# Patient Record
Sex: Female | Born: 1940 | Race: Black or African American | Hispanic: No | Marital: Married | Smoking: Never smoker
Health system: Southern US, Community
[De-identification: ages and names within clinical notes are randomized; demographics above are authoritative.]

## PROBLEM LIST (undated history)

## (undated) DIAGNOSIS — Z8719 Personal history of other diseases of the digestive system: Secondary | ICD-10-CM

## (undated) DIAGNOSIS — Z9989 Dependence on other enabling machines and devices: Secondary | ICD-10-CM

## (undated) DIAGNOSIS — Z531 Procedure and treatment not carried out because of patient's decision for reasons of belief and group pressure: Secondary | ICD-10-CM

## (undated) DIAGNOSIS — R202 Paresthesia of skin: Secondary | ICD-10-CM

## (undated) DIAGNOSIS — N289 Disorder of kidney and ureter, unspecified: Secondary | ICD-10-CM

## (undated) DIAGNOSIS — M329 Systemic lupus erythematosus, unspecified: Secondary | ICD-10-CM

## (undated) DIAGNOSIS — I82409 Acute embolism and thrombosis of unspecified deep veins of unspecified lower extremity: Secondary | ICD-10-CM

## (undated) DIAGNOSIS — K5090 Crohn's disease, unspecified, without complications: Secondary | ICD-10-CM

## (undated) DIAGNOSIS — Z87442 Personal history of urinary calculi: Secondary | ICD-10-CM

## (undated) DIAGNOSIS — E039 Hypothyroidism, unspecified: Secondary | ICD-10-CM

## (undated) DIAGNOSIS — I4891 Unspecified atrial fibrillation: Secondary | ICD-10-CM

## (undated) DIAGNOSIS — D649 Anemia, unspecified: Secondary | ICD-10-CM

## (undated) DIAGNOSIS — K56609 Unspecified intestinal obstruction, unspecified as to partial versus complete obstruction: Secondary | ICD-10-CM

## (undated) DIAGNOSIS — G4733 Obstructive sleep apnea (adult) (pediatric): Secondary | ICD-10-CM

## (undated) DIAGNOSIS — R200 Anesthesia of skin: Secondary | ICD-10-CM

## (undated) DIAGNOSIS — J189 Pneumonia, unspecified organism: Secondary | ICD-10-CM

## (undated) DIAGNOSIS — K219 Gastro-esophageal reflux disease without esophagitis: Secondary | ICD-10-CM

## (undated) DIAGNOSIS — I10 Essential (primary) hypertension: Secondary | ICD-10-CM

## (undated) DIAGNOSIS — I82403 Acute embolism and thrombosis of unspecified deep veins of lower extremity, bilateral: Secondary | ICD-10-CM

## (undated) DIAGNOSIS — K922 Gastrointestinal hemorrhage, unspecified: Secondary | ICD-10-CM

## (undated) DIAGNOSIS — M1990 Unspecified osteoarthritis, unspecified site: Secondary | ICD-10-CM

## (undated) DIAGNOSIS — M109 Gout, unspecified: Secondary | ICD-10-CM

## (undated) DIAGNOSIS — F419 Anxiety disorder, unspecified: Secondary | ICD-10-CM

## (undated) DIAGNOSIS — K439 Ventral hernia without obstruction or gangrene: Secondary | ICD-10-CM

## (undated) HISTORY — DX: Gout, unspecified: M10.9

## (undated) HISTORY — PX: BREAST EXCISIONAL BIOPSY: SUR124

## (undated) HISTORY — PX: INSERTION OF DIALYSIS CATHETER: SHX1324

## (undated) HISTORY — PX: EXCISIONAL HEMORRHOIDECTOMY: SHX1541

## (undated) HISTORY — PX: PARATHYROID IMPLANT REMOVAL: SHX5276

## (undated) HISTORY — PX: BONE MARROW BIOPSY: SHX199

## (undated) HISTORY — PX: CATARACT EXTRACTION W/ INTRAOCULAR LENS  IMPLANT, BILATERAL: SHX1307

---

## 1993-05-09 HISTORY — PX: ABDOMINAL EXPLORATION SURGERY: SHX538

## 1993-05-09 HISTORY — PX: CHOLECYSTECTOMY: SHX55

## 1996-05-09 HISTORY — PX: COLOSTOMY: SHX63

## 1996-05-09 HISTORY — PX: COLECTOMY: SHX59

## 1997-05-09 HISTORY — PX: COLOSTOMY REVERSAL: SHX5782

## 1997-08-17 ENCOUNTER — Emergency Department (HOSPITAL_COMMUNITY): Admission: EM | Admit: 1997-08-17 | Discharge: 1997-08-17 | Payer: Self-pay | Admitting: Emergency Medicine

## 1997-08-26 ENCOUNTER — Other Ambulatory Visit: Admission: RE | Admit: 1997-08-26 | Discharge: 1997-08-26 | Payer: Self-pay | Admitting: *Deleted

## 1997-09-30 ENCOUNTER — Other Ambulatory Visit: Admission: RE | Admit: 1997-09-30 | Discharge: 1997-09-30 | Payer: Self-pay | Admitting: Gynecology

## 1998-01-17 ENCOUNTER — Emergency Department (HOSPITAL_COMMUNITY): Admission: EM | Admit: 1998-01-17 | Discharge: 1998-01-17 | Payer: Self-pay | Admitting: Emergency Medicine

## 1998-06-07 ENCOUNTER — Emergency Department (HOSPITAL_COMMUNITY): Admission: EM | Admit: 1998-06-07 | Discharge: 1998-06-07 | Payer: Self-pay | Admitting: Emergency Medicine

## 1998-06-16 ENCOUNTER — Ambulatory Visit (HOSPITAL_COMMUNITY): Admission: RE | Admit: 1998-06-16 | Discharge: 1998-06-16 | Payer: Self-pay | Admitting: Nephrology

## 1998-06-24 ENCOUNTER — Ambulatory Visit (HOSPITAL_COMMUNITY): Admission: RE | Admit: 1998-06-24 | Discharge: 1998-06-24 | Payer: Self-pay | Admitting: General Surgery

## 1998-07-09 ENCOUNTER — Observation Stay (HOSPITAL_COMMUNITY): Admission: AD | Admit: 1998-07-09 | Discharge: 1998-07-10 | Payer: Self-pay | Admitting: Gastroenterology

## 1998-08-18 ENCOUNTER — Ambulatory Visit (HOSPITAL_COMMUNITY): Admission: RE | Admit: 1998-08-18 | Discharge: 1998-08-18 | Payer: Self-pay | Admitting: Psychiatry

## 1998-11-19 ENCOUNTER — Other Ambulatory Visit: Admission: RE | Admit: 1998-11-19 | Discharge: 1998-11-19 | Payer: Self-pay | Admitting: Gynecology

## 1998-12-30 ENCOUNTER — Ambulatory Visit (HOSPITAL_COMMUNITY): Admission: RE | Admit: 1998-12-30 | Discharge: 1998-12-30 | Payer: Self-pay | Admitting: *Deleted

## 1999-01-04 ENCOUNTER — Inpatient Hospital Stay (HOSPITAL_COMMUNITY): Admission: EM | Admit: 1999-01-04 | Discharge: 1999-01-08 | Payer: Self-pay | Admitting: Emergency Medicine

## 1999-01-12 ENCOUNTER — Ambulatory Visit (HOSPITAL_COMMUNITY): Admission: RE | Admit: 1999-01-12 | Discharge: 1999-01-12 | Payer: Self-pay | Admitting: Nephrology

## 1999-01-16 ENCOUNTER — Inpatient Hospital Stay (HOSPITAL_COMMUNITY): Admission: EM | Admit: 1999-01-16 | Discharge: 1999-01-17 | Payer: Self-pay | Admitting: Emergency Medicine

## 1999-01-16 ENCOUNTER — Encounter: Payer: Self-pay | Admitting: Nephrology

## 1999-01-19 ENCOUNTER — Ambulatory Visit (HOSPITAL_COMMUNITY): Admission: RE | Admit: 1999-01-19 | Discharge: 1999-01-19 | Payer: Self-pay | Admitting: Nephrology

## 1999-02-02 ENCOUNTER — Ambulatory Visit (HOSPITAL_COMMUNITY): Admission: RE | Admit: 1999-02-02 | Discharge: 1999-02-03 | Payer: Self-pay | Admitting: *Deleted

## 1999-02-03 ENCOUNTER — Encounter: Payer: Self-pay | Admitting: *Deleted

## 1999-03-04 ENCOUNTER — Encounter: Payer: Self-pay | Admitting: *Deleted

## 1999-03-04 ENCOUNTER — Ambulatory Visit (HOSPITAL_COMMUNITY): Admission: RE | Admit: 1999-03-04 | Discharge: 1999-03-04 | Payer: Self-pay | Admitting: *Deleted

## 1999-03-07 ENCOUNTER — Emergency Department (HOSPITAL_COMMUNITY): Admission: EM | Admit: 1999-03-07 | Discharge: 1999-03-07 | Payer: Self-pay | Admitting: Emergency Medicine

## 1999-03-10 ENCOUNTER — Ambulatory Visit (HOSPITAL_COMMUNITY): Admission: RE | Admit: 1999-03-10 | Discharge: 1999-03-10 | Payer: Self-pay | Admitting: *Deleted

## 1999-04-07 ENCOUNTER — Encounter: Admission: RE | Admit: 1999-04-07 | Discharge: 1999-04-07 | Payer: Self-pay | Admitting: Infectious Diseases

## 1999-06-10 ENCOUNTER — Ambulatory Visit (HOSPITAL_COMMUNITY): Admission: RE | Admit: 1999-06-10 | Discharge: 1999-06-10 | Payer: Self-pay | Admitting: Nephrology

## 1999-06-29 ENCOUNTER — Encounter (INDEPENDENT_AMBULATORY_CARE_PROVIDER_SITE_OTHER): Payer: Self-pay

## 1999-06-29 ENCOUNTER — Inpatient Hospital Stay (HOSPITAL_COMMUNITY): Admission: AD | Admit: 1999-06-29 | Discharge: 1999-07-05 | Payer: Self-pay | Admitting: Gastroenterology

## 1999-07-02 ENCOUNTER — Encounter: Payer: Self-pay | Admitting: Gastroenterology

## 1999-07-15 ENCOUNTER — Ambulatory Visit (HOSPITAL_COMMUNITY): Admission: RE | Admit: 1999-07-15 | Discharge: 1999-07-15 | Payer: Self-pay | Admitting: Gastroenterology

## 1999-07-15 ENCOUNTER — Encounter: Payer: Self-pay | Admitting: Gastroenterology

## 1999-09-24 ENCOUNTER — Inpatient Hospital Stay (HOSPITAL_COMMUNITY): Admission: EM | Admit: 1999-09-24 | Discharge: 1999-09-30 | Payer: Self-pay | Admitting: Emergency Medicine

## 1999-09-24 ENCOUNTER — Encounter: Payer: Self-pay | Admitting: Emergency Medicine

## 1999-09-25 ENCOUNTER — Encounter: Payer: Self-pay | Admitting: Nephrology

## 1999-09-27 ENCOUNTER — Encounter: Payer: Self-pay | Admitting: Gastroenterology

## 1999-09-28 ENCOUNTER — Encounter: Payer: Self-pay | Admitting: Gastroenterology

## 1999-10-07 ENCOUNTER — Encounter (INDEPENDENT_AMBULATORY_CARE_PROVIDER_SITE_OTHER): Payer: Self-pay | Admitting: *Deleted

## 1999-10-07 ENCOUNTER — Encounter: Payer: Self-pay | Admitting: Urology

## 1999-10-07 ENCOUNTER — Ambulatory Visit (HOSPITAL_COMMUNITY): Admission: RE | Admit: 1999-10-07 | Discharge: 1999-10-08 | Payer: Self-pay | Admitting: Urology

## 1999-11-25 ENCOUNTER — Ambulatory Visit (HOSPITAL_COMMUNITY): Admission: RE | Admit: 1999-11-25 | Discharge: 1999-11-25 | Payer: Self-pay | Admitting: Urology

## 1999-11-25 ENCOUNTER — Encounter: Payer: Self-pay | Admitting: Urology

## 1999-12-08 ENCOUNTER — Encounter: Payer: Self-pay | Admitting: Urology

## 1999-12-08 ENCOUNTER — Encounter: Admission: RE | Admit: 1999-12-08 | Discharge: 1999-12-08 | Payer: Self-pay | Admitting: Urology

## 1999-12-19 ENCOUNTER — Inpatient Hospital Stay (HOSPITAL_COMMUNITY): Admission: EM | Admit: 1999-12-19 | Discharge: 1999-12-24 | Payer: Self-pay | Admitting: Emergency Medicine

## 1999-12-21 ENCOUNTER — Encounter: Payer: Self-pay | Admitting: General Surgery

## 1999-12-21 ENCOUNTER — Encounter (INDEPENDENT_AMBULATORY_CARE_PROVIDER_SITE_OTHER): Payer: Self-pay | Admitting: Specialist

## 2000-02-10 ENCOUNTER — Encounter: Admission: RE | Admit: 2000-02-10 | Discharge: 2000-02-10 | Payer: Self-pay | Admitting: Urology

## 2000-02-10 ENCOUNTER — Encounter: Payer: Self-pay | Admitting: Urology

## 2000-05-29 ENCOUNTER — Encounter: Admission: RE | Admit: 2000-05-29 | Discharge: 2000-05-29 | Payer: Self-pay | Admitting: Nephrology

## 2000-05-29 ENCOUNTER — Encounter: Payer: Self-pay | Admitting: Nephrology

## 2000-06-01 ENCOUNTER — Inpatient Hospital Stay (HOSPITAL_COMMUNITY): Admission: RE | Admit: 2000-06-01 | Discharge: 2000-06-04 | Payer: Self-pay

## 2000-06-01 ENCOUNTER — Encounter (INDEPENDENT_AMBULATORY_CARE_PROVIDER_SITE_OTHER): Payer: Self-pay | Admitting: *Deleted

## 2000-06-20 ENCOUNTER — Other Ambulatory Visit: Admission: RE | Admit: 2000-06-20 | Discharge: 2000-06-20 | Payer: Self-pay | Admitting: Gynecology

## 2000-08-01 ENCOUNTER — Ambulatory Visit (HOSPITAL_COMMUNITY): Admission: RE | Admit: 2000-08-01 | Discharge: 2000-08-01 | Payer: Self-pay | Admitting: Nephrology

## 2000-08-08 ENCOUNTER — Ambulatory Visit (HOSPITAL_COMMUNITY): Admission: RE | Admit: 2000-08-08 | Discharge: 2000-08-08 | Payer: Self-pay | Admitting: Nephrology

## 2000-08-08 ENCOUNTER — Encounter: Payer: Self-pay | Admitting: Nephrology

## 2000-09-11 ENCOUNTER — Encounter (INDEPENDENT_AMBULATORY_CARE_PROVIDER_SITE_OTHER): Payer: Self-pay | Admitting: Specialist

## 2000-09-11 ENCOUNTER — Encounter: Payer: Self-pay | Admitting: Nephrology

## 2000-09-11 ENCOUNTER — Inpatient Hospital Stay (HOSPITAL_COMMUNITY): Admission: EM | Admit: 2000-09-11 | Discharge: 2000-09-19 | Payer: Self-pay | Admitting: Emergency Medicine

## 2000-09-14 ENCOUNTER — Encounter: Payer: Self-pay | Admitting: Nephrology

## 2000-09-18 ENCOUNTER — Encounter: Payer: Self-pay | Admitting: Nephrology

## 2000-09-25 ENCOUNTER — Emergency Department (HOSPITAL_COMMUNITY): Admission: EM | Admit: 2000-09-25 | Discharge: 2000-09-25 | Payer: Self-pay | Admitting: Emergency Medicine

## 2000-09-26 ENCOUNTER — Encounter: Payer: Self-pay | Admitting: Nephrology

## 2000-09-26 ENCOUNTER — Inpatient Hospital Stay (HOSPITAL_COMMUNITY): Admission: AD | Admit: 2000-09-26 | Discharge: 2000-09-27 | Payer: Self-pay | Admitting: Nephrology

## 2000-10-04 ENCOUNTER — Inpatient Hospital Stay (HOSPITAL_COMMUNITY): Admission: RE | Admit: 2000-10-04 | Discharge: 2000-10-04 | Payer: Self-pay | Admitting: Vascular Surgery

## 2000-11-01 ENCOUNTER — Emergency Department (HOSPITAL_COMMUNITY): Admission: EM | Admit: 2000-11-01 | Discharge: 2000-11-01 | Payer: Self-pay | Admitting: Emergency Medicine

## 2000-11-07 ENCOUNTER — Ambulatory Visit (HOSPITAL_COMMUNITY): Admission: RE | Admit: 2000-11-07 | Discharge: 2000-11-07 | Payer: Self-pay | Admitting: Nephrology

## 2000-11-07 ENCOUNTER — Encounter: Payer: Self-pay | Admitting: Nephrology

## 2000-11-10 ENCOUNTER — Encounter: Admission: RE | Admit: 2000-11-10 | Discharge: 2000-11-10 | Payer: Self-pay | Admitting: Nephrology

## 2000-11-10 ENCOUNTER — Encounter: Payer: Self-pay | Admitting: Nephrology

## 2000-11-14 ENCOUNTER — Ambulatory Visit (HOSPITAL_COMMUNITY): Admission: RE | Admit: 2000-11-14 | Discharge: 2000-11-14 | Payer: Self-pay | Admitting: Nephrology

## 2000-12-01 ENCOUNTER — Encounter: Admission: RE | Admit: 2000-12-01 | Discharge: 2000-12-01 | Payer: Self-pay | Admitting: Nephrology

## 2000-12-01 ENCOUNTER — Encounter: Payer: Self-pay | Admitting: Nephrology

## 2001-01-18 ENCOUNTER — Encounter: Payer: Self-pay | Admitting: Nephrology

## 2001-01-18 ENCOUNTER — Ambulatory Visit (HOSPITAL_COMMUNITY): Admission: RE | Admit: 2001-01-18 | Discharge: 2001-01-18 | Payer: Self-pay | Admitting: Nephrology

## 2001-03-10 ENCOUNTER — Ambulatory Visit (HOSPITAL_COMMUNITY): Admission: RE | Admit: 2001-03-10 | Discharge: 2001-03-11 | Payer: Self-pay | Admitting: General Surgery

## 2001-03-10 ENCOUNTER — Encounter (INDEPENDENT_AMBULATORY_CARE_PROVIDER_SITE_OTHER): Payer: Self-pay | Admitting: Specialist

## 2001-03-15 ENCOUNTER — Emergency Department (HOSPITAL_COMMUNITY): Admission: EM | Admit: 2001-03-15 | Discharge: 2001-03-15 | Payer: Self-pay | Admitting: Emergency Medicine

## 2001-07-26 ENCOUNTER — Other Ambulatory Visit: Admission: RE | Admit: 2001-07-26 | Discharge: 2001-07-26 | Payer: Self-pay | Admitting: Gynecology

## 2001-11-20 ENCOUNTER — Encounter: Payer: Self-pay | Admitting: Nephrology

## 2001-11-20 ENCOUNTER — Encounter: Admission: RE | Admit: 2001-11-20 | Discharge: 2001-11-20 | Payer: Self-pay | Admitting: Nephrology

## 2001-12-06 ENCOUNTER — Encounter: Payer: Self-pay | Admitting: Nephrology

## 2001-12-06 ENCOUNTER — Encounter: Admission: RE | Admit: 2001-12-06 | Discharge: 2001-12-06 | Payer: Self-pay | Admitting: Nephrology

## 2002-02-14 ENCOUNTER — Ambulatory Visit (HOSPITAL_COMMUNITY): Admission: RE | Admit: 2002-02-14 | Discharge: 2002-02-14 | Payer: Self-pay | Admitting: Gastroenterology

## 2002-02-25 ENCOUNTER — Encounter: Admission: RE | Admit: 2002-02-25 | Discharge: 2002-02-25 | Payer: Self-pay | Admitting: Nephrology

## 2002-02-25 ENCOUNTER — Encounter: Payer: Self-pay | Admitting: Nephrology

## 2002-04-19 ENCOUNTER — Encounter: Payer: Self-pay | Admitting: Nephrology

## 2002-04-19 ENCOUNTER — Encounter: Admission: RE | Admit: 2002-04-19 | Discharge: 2002-04-19 | Payer: Self-pay | Admitting: Nephrology

## 2002-08-01 ENCOUNTER — Inpatient Hospital Stay (HOSPITAL_COMMUNITY): Admission: AD | Admit: 2002-08-01 | Discharge: 2002-08-15 | Payer: Self-pay | Admitting: Emergency Medicine

## 2002-08-01 ENCOUNTER — Encounter: Payer: Self-pay | Admitting: Nephrology

## 2002-08-02 ENCOUNTER — Encounter: Payer: Self-pay | Admitting: Nephrology

## 2002-08-04 ENCOUNTER — Encounter: Payer: Self-pay | Admitting: Nephrology

## 2002-08-07 ENCOUNTER — Encounter: Payer: Self-pay | Admitting: Nephrology

## 2002-08-09 ENCOUNTER — Encounter (INDEPENDENT_AMBULATORY_CARE_PROVIDER_SITE_OTHER): Payer: Self-pay | Admitting: Specialist

## 2002-08-12 ENCOUNTER — Encounter: Payer: Self-pay | Admitting: Nephrology

## 2002-08-13 ENCOUNTER — Encounter: Payer: Self-pay | Admitting: Nephrology

## 2002-10-03 ENCOUNTER — Encounter: Payer: Self-pay | Admitting: Nephrology

## 2002-10-03 ENCOUNTER — Encounter: Admission: RE | Admit: 2002-10-03 | Discharge: 2002-10-03 | Payer: Self-pay | Admitting: Nephrology

## 2002-10-22 ENCOUNTER — Inpatient Hospital Stay (HOSPITAL_COMMUNITY): Admission: EM | Admit: 2002-10-22 | Discharge: 2002-11-01 | Payer: Self-pay | Admitting: Emergency Medicine

## 2002-10-22 ENCOUNTER — Encounter: Payer: Self-pay | Admitting: Emergency Medicine

## 2002-10-23 ENCOUNTER — Encounter: Payer: Self-pay | Admitting: Nephrology

## 2002-10-26 ENCOUNTER — Encounter: Payer: Self-pay | Admitting: Nephrology

## 2002-11-21 ENCOUNTER — Ambulatory Visit (HOSPITAL_COMMUNITY): Admission: RE | Admit: 2002-11-21 | Discharge: 2002-11-21 | Payer: Self-pay | Admitting: Nephrology

## 2002-11-21 ENCOUNTER — Encounter: Payer: Self-pay | Admitting: Nephrology

## 2002-12-03 ENCOUNTER — Ambulatory Visit (HOSPITAL_COMMUNITY): Admission: RE | Admit: 2002-12-03 | Discharge: 2002-12-03 | Payer: Self-pay | Admitting: Vascular Surgery

## 2002-12-03 ENCOUNTER — Encounter: Payer: Self-pay | Admitting: Vascular Surgery

## 2003-01-21 ENCOUNTER — Encounter: Admission: RE | Admit: 2003-01-21 | Discharge: 2003-01-21 | Payer: Self-pay | Admitting: Nephrology

## 2003-01-21 ENCOUNTER — Encounter: Payer: Self-pay | Admitting: Nephrology

## 2003-01-28 ENCOUNTER — Encounter: Admission: RE | Admit: 2003-01-28 | Discharge: 2003-01-28 | Payer: Self-pay | Admitting: Nephrology

## 2003-01-28 ENCOUNTER — Encounter: Payer: Self-pay | Admitting: Nephrology

## 2003-03-04 ENCOUNTER — Ambulatory Visit (HOSPITAL_COMMUNITY): Admission: RE | Admit: 2003-03-04 | Discharge: 2003-03-04 | Payer: Self-pay | Admitting: Nephrology

## 2003-03-20 ENCOUNTER — Inpatient Hospital Stay (HOSPITAL_COMMUNITY): Admission: RE | Admit: 2003-03-20 | Discharge: 2003-03-23 | Payer: Self-pay | Admitting: Urology

## 2003-03-20 ENCOUNTER — Encounter (INDEPENDENT_AMBULATORY_CARE_PROVIDER_SITE_OTHER): Payer: Self-pay | Admitting: *Deleted

## 2003-05-31 ENCOUNTER — Inpatient Hospital Stay (HOSPITAL_COMMUNITY): Admission: EM | Admit: 2003-05-31 | Discharge: 2003-06-03 | Payer: Self-pay

## 2004-03-18 ENCOUNTER — Ambulatory Visit (HOSPITAL_COMMUNITY): Admission: RE | Admit: 2004-03-18 | Discharge: 2004-03-18 | Payer: Self-pay | Admitting: Nephrology

## 2004-04-27 ENCOUNTER — Other Ambulatory Visit: Admission: RE | Admit: 2004-04-27 | Discharge: 2004-04-27 | Payer: Self-pay | Admitting: Gynecology

## 2004-05-09 HISTORY — PX: NEPHRECTOMY TRANSPLANTED ORGAN: SUR880

## 2004-06-10 ENCOUNTER — Encounter: Admission: RE | Admit: 2004-06-10 | Discharge: 2004-06-10 | Payer: Self-pay | Admitting: Nephrology

## 2005-02-02 ENCOUNTER — Encounter: Admission: RE | Admit: 2005-02-02 | Discharge: 2005-02-02 | Payer: Self-pay | Admitting: Nephrology

## 2005-04-08 ENCOUNTER — Encounter (HOSPITAL_COMMUNITY): Admission: RE | Admit: 2005-04-08 | Discharge: 2005-07-07 | Payer: Self-pay | Admitting: Nephrology

## 2005-04-11 ENCOUNTER — Other Ambulatory Visit: Admission: RE | Admit: 2005-04-11 | Discharge: 2005-04-11 | Payer: Self-pay | Admitting: Gynecology

## 2005-08-11 ENCOUNTER — Encounter (HOSPITAL_COMMUNITY): Admission: RE | Admit: 2005-08-11 | Discharge: 2005-11-09 | Payer: Self-pay | Admitting: Nephrology

## 2005-11-10 ENCOUNTER — Emergency Department (HOSPITAL_COMMUNITY): Admission: EM | Admit: 2005-11-10 | Discharge: 2005-11-10 | Payer: Self-pay | Admitting: Emergency Medicine

## 2005-11-30 ENCOUNTER — Encounter (HOSPITAL_COMMUNITY): Admission: RE | Admit: 2005-11-30 | Discharge: 2006-01-20 | Payer: Self-pay | Admitting: Nephrology

## 2006-01-28 ENCOUNTER — Emergency Department (HOSPITAL_COMMUNITY): Admission: EM | Admit: 2006-01-28 | Discharge: 2006-01-28 | Payer: Self-pay | Admitting: Emergency Medicine

## 2006-02-08 ENCOUNTER — Encounter (HOSPITAL_COMMUNITY): Admission: RE | Admit: 2006-02-08 | Discharge: 2006-05-09 | Payer: Self-pay | Admitting: Critical Care Medicine

## 2006-05-11 ENCOUNTER — Other Ambulatory Visit: Admission: RE | Admit: 2006-05-11 | Discharge: 2006-05-11 | Payer: Self-pay | Admitting: Gynecology

## 2006-05-30 ENCOUNTER — Encounter: Admission: RE | Admit: 2006-05-30 | Discharge: 2006-05-30 | Payer: Self-pay | Admitting: Nephrology

## 2006-06-14 ENCOUNTER — Encounter (HOSPITAL_COMMUNITY): Admission: RE | Admit: 2006-06-14 | Discharge: 2006-09-12 | Payer: Self-pay | Admitting: Critical Care Medicine

## 2006-07-17 ENCOUNTER — Ambulatory Visit (HOSPITAL_COMMUNITY): Admission: RE | Admit: 2006-07-17 | Discharge: 2006-07-17 | Payer: Self-pay | Admitting: Nephrology

## 2006-09-13 ENCOUNTER — Ambulatory Visit: Payer: Self-pay | Admitting: Internal Medicine

## 2006-09-28 ENCOUNTER — Ambulatory Visit: Payer: Self-pay

## 2006-09-28 ENCOUNTER — Encounter (INDEPENDENT_AMBULATORY_CARE_PROVIDER_SITE_OTHER): Payer: Self-pay | Admitting: *Deleted

## 2006-10-11 ENCOUNTER — Encounter (HOSPITAL_COMMUNITY): Admission: RE | Admit: 2006-10-11 | Discharge: 2007-01-09 | Payer: Self-pay | Admitting: Nephrology

## 2007-04-25 ENCOUNTER — Encounter: Admission: RE | Admit: 2007-04-25 | Discharge: 2007-04-25 | Payer: Self-pay | Admitting: Nephrology

## 2007-05-13 ENCOUNTER — Emergency Department (HOSPITAL_COMMUNITY): Admission: EM | Admit: 2007-05-13 | Discharge: 2007-05-14 | Payer: Self-pay | Admitting: Emergency Medicine

## 2007-09-12 DIAGNOSIS — G4730 Sleep apnea, unspecified: Secondary | ICD-10-CM

## 2007-09-12 DIAGNOSIS — G4710 Hypersomnia, unspecified: Secondary | ICD-10-CM

## 2007-09-13 ENCOUNTER — Encounter: Payer: Self-pay | Admitting: Internal Medicine

## 2007-11-02 ENCOUNTER — Encounter (HOSPITAL_COMMUNITY): Admission: RE | Admit: 2007-11-02 | Discharge: 2008-01-30 | Payer: Self-pay | Admitting: Nephrology

## 2008-01-02 ENCOUNTER — Emergency Department (HOSPITAL_BASED_OUTPATIENT_CLINIC_OR_DEPARTMENT_OTHER): Admission: EM | Admit: 2008-01-02 | Discharge: 2008-01-02 | Payer: Self-pay | Admitting: Emergency Medicine

## 2008-02-05 ENCOUNTER — Ambulatory Visit: Payer: Self-pay | Admitting: *Deleted

## 2008-02-21 ENCOUNTER — Encounter (HOSPITAL_COMMUNITY): Admission: RE | Admit: 2008-02-21 | Discharge: 2008-05-08 | Payer: Self-pay | Admitting: Nephrology

## 2008-03-19 ENCOUNTER — Inpatient Hospital Stay (HOSPITAL_COMMUNITY): Admission: AD | Admit: 2008-03-19 | Discharge: 2008-03-21 | Payer: Self-pay | Admitting: Nephrology

## 2008-03-19 ENCOUNTER — Encounter: Payer: Self-pay | Admitting: Emergency Medicine

## 2008-03-30 ENCOUNTER — Emergency Department (HOSPITAL_COMMUNITY): Admission: EM | Admit: 2008-03-30 | Discharge: 2008-03-30 | Payer: Self-pay | Admitting: Emergency Medicine

## 2008-04-23 ENCOUNTER — Inpatient Hospital Stay (HOSPITAL_COMMUNITY): Admission: EM | Admit: 2008-04-23 | Discharge: 2008-04-27 | Payer: Self-pay | Admitting: Emergency Medicine

## 2008-05-09 ENCOUNTER — Ambulatory Visit: Payer: Self-pay | Admitting: Diagnostic Radiology

## 2008-05-09 ENCOUNTER — Emergency Department (HOSPITAL_BASED_OUTPATIENT_CLINIC_OR_DEPARTMENT_OTHER): Admission: EM | Admit: 2008-05-09 | Discharge: 2008-05-09 | Payer: Self-pay | Admitting: Emergency Medicine

## 2008-06-16 ENCOUNTER — Encounter (HOSPITAL_COMMUNITY): Admission: RE | Admit: 2008-06-16 | Discharge: 2008-09-14 | Payer: Self-pay | Admitting: Nephrology

## 2008-07-16 ENCOUNTER — Emergency Department (HOSPITAL_COMMUNITY): Admission: EM | Admit: 2008-07-16 | Discharge: 2008-07-16 | Payer: Self-pay | Admitting: Emergency Medicine

## 2008-07-18 ENCOUNTER — Inpatient Hospital Stay (HOSPITAL_COMMUNITY): Admission: EM | Admit: 2008-07-18 | Discharge: 2008-07-22 | Payer: Self-pay | Admitting: Emergency Medicine

## 2008-07-21 ENCOUNTER — Encounter (INDEPENDENT_AMBULATORY_CARE_PROVIDER_SITE_OTHER): Payer: Self-pay | Admitting: Gastroenterology

## 2008-07-23 ENCOUNTER — Emergency Department (HOSPITAL_COMMUNITY): Admission: EM | Admit: 2008-07-23 | Discharge: 2008-07-23 | Payer: Self-pay | Admitting: Emergency Medicine

## 2008-09-01 ENCOUNTER — Emergency Department (HOSPITAL_COMMUNITY): Admission: EM | Admit: 2008-09-01 | Discharge: 2008-09-02 | Payer: Self-pay | Admitting: Emergency Medicine

## 2008-09-02 ENCOUNTER — Inpatient Hospital Stay (HOSPITAL_COMMUNITY): Admission: EM | Admit: 2008-09-02 | Discharge: 2008-09-06 | Payer: Self-pay | Admitting: *Deleted

## 2008-09-03 ENCOUNTER — Encounter (INDEPENDENT_AMBULATORY_CARE_PROVIDER_SITE_OTHER): Payer: Self-pay | Admitting: Nephrology

## 2008-11-07 ENCOUNTER — Encounter: Admission: RE | Admit: 2008-11-07 | Discharge: 2009-02-06 | Payer: Self-pay | Admitting: Nephrology

## 2009-02-19 ENCOUNTER — Encounter (HOSPITAL_COMMUNITY): Admission: RE | Admit: 2009-02-19 | Discharge: 2009-05-20 | Payer: Self-pay | Admitting: Nephrology

## 2009-03-06 ENCOUNTER — Encounter: Admission: RE | Admit: 2009-03-06 | Discharge: 2009-04-15 | Payer: Self-pay | Admitting: Neurology

## 2009-04-09 ENCOUNTER — Emergency Department (HOSPITAL_COMMUNITY): Admission: EM | Admit: 2009-04-09 | Discharge: 2009-04-09 | Payer: Self-pay | Admitting: Emergency Medicine

## 2009-05-29 ENCOUNTER — Encounter: Admission: RE | Admit: 2009-05-29 | Discharge: 2009-08-27 | Payer: Self-pay | Admitting: Nephrology

## 2009-07-30 ENCOUNTER — Ambulatory Visit (HOSPITAL_COMMUNITY): Admission: RE | Admit: 2009-07-30 | Discharge: 2009-07-30 | Payer: Self-pay | Admitting: Gastroenterology

## 2009-07-31 ENCOUNTER — Emergency Department (HOSPITAL_COMMUNITY): Admission: EM | Admit: 2009-07-31 | Discharge: 2009-07-31 | Payer: Self-pay | Admitting: Emergency Medicine

## 2009-08-27 ENCOUNTER — Encounter (HOSPITAL_COMMUNITY): Admission: RE | Admit: 2009-08-27 | Discharge: 2009-11-25 | Payer: Self-pay | Admitting: Nephrology

## 2009-09-18 ENCOUNTER — Encounter: Payer: Self-pay | Admitting: Internal Medicine

## 2009-09-21 ENCOUNTER — Telehealth (INDEPENDENT_AMBULATORY_CARE_PROVIDER_SITE_OTHER): Payer: Self-pay | Admitting: *Deleted

## 2009-10-01 ENCOUNTER — Encounter: Admission: RE | Admit: 2009-10-01 | Discharge: 2009-10-01 | Payer: Self-pay | Admitting: Ophthalmology

## 2009-10-03 ENCOUNTER — Inpatient Hospital Stay (HOSPITAL_COMMUNITY): Admission: EM | Admit: 2009-10-03 | Discharge: 2009-10-08 | Payer: Self-pay | Admitting: Emergency Medicine

## 2009-10-03 ENCOUNTER — Ambulatory Visit: Payer: Self-pay | Admitting: Family Medicine

## 2009-11-03 ENCOUNTER — Emergency Department (HOSPITAL_BASED_OUTPATIENT_CLINIC_OR_DEPARTMENT_OTHER): Admission: EM | Admit: 2009-11-03 | Discharge: 2009-11-04 | Payer: Self-pay | Admitting: Emergency Medicine

## 2009-11-04 ENCOUNTER — Ambulatory Visit: Payer: Self-pay | Admitting: Diagnostic Radiology

## 2009-11-26 ENCOUNTER — Encounter (HOSPITAL_COMMUNITY): Admission: RE | Admit: 2009-11-26 | Discharge: 2010-02-24 | Payer: Self-pay | Admitting: Nephrology

## 2010-02-25 ENCOUNTER — Encounter (HOSPITAL_COMMUNITY)
Admission: RE | Admit: 2010-02-25 | Discharge: 2010-05-26 | Payer: Self-pay | Source: Home / Self Care | Attending: Nephrology | Admitting: Nephrology

## 2010-05-09 DIAGNOSIS — K922 Gastrointestinal hemorrhage, unspecified: Secondary | ICD-10-CM

## 2010-05-09 HISTORY — DX: Gastrointestinal hemorrhage, unspecified: K92.2

## 2010-05-09 HISTORY — PX: VENA CAVA FILTER PLACEMENT: SUR1032

## 2010-05-27 ENCOUNTER — Encounter (HOSPITAL_COMMUNITY)
Admission: RE | Admit: 2010-05-27 | Discharge: 2010-06-08 | Payer: Self-pay | Source: Home / Self Care | Attending: Nephrology | Admitting: Nephrology

## 2010-05-30 ENCOUNTER — Encounter: Payer: Self-pay | Admitting: Gastroenterology

## 2010-05-31 LAB — IRON AND TIBC
Iron: 145 ug/dL — ABNORMAL HIGH (ref 42–135)
Saturation Ratios: 63 % — ABNORMAL HIGH (ref 20–55)
TIBC: 232 ug/dL — ABNORMAL LOW (ref 250–470)
UIBC: 87 ug/dL

## 2010-06-10 NOTE — Letter (Signed)
Summary: LMN for Sleep Therapy/[facility]  LMN for Sleep Therapy/[facility]   Imported By: Phillis Knack 10/02/2009 14:01:11  _____________________________________________________________________  External Attachment:    Type:   Image     Comment:   External Document

## 2010-06-10 NOTE — Progress Notes (Signed)
Summary: Needs ROV with CY   Phone Note Outgoing Call   Call placed by: Raymondo Band RN,  Sep 21, 2009 12:13 PM Call placed to: Patient Summary of Call: Per CY, he has form from [facility] to update pt's cpap however pt has not been seen within the last year.  Per CY, pt needs to schedule ROV with him.  Called pt's home number, [id number] to inform of this and schedule ROV.   Initial call taken by: Raymondo Band RN,  Sep 21, 2009 1:41 PM  Follow-up for Phone Call        schedulled pt for 1st available...10/22/2009 @ 3:45 per CDY & message from [facility]. Follow-up by: Zigmund Gottron,  Sep 22, 2009 8:27 AM

## 2010-06-17 ENCOUNTER — Encounter (HOSPITAL_COMMUNITY): Payer: Self-pay

## 2010-06-24 ENCOUNTER — Other Ambulatory Visit: Payer: Self-pay | Admitting: Nephrology

## 2010-06-24 ENCOUNTER — Encounter (HOSPITAL_COMMUNITY)
Admission: RE | Admit: 2010-06-24 | Discharge: 2010-06-24 | Disposition: A | Discharge status: Home / Self Care | Payer: Medicare Other | Source: Ambulatory Visit | Attending: Nephrology | Admitting: Nephrology

## 2010-06-24 ENCOUNTER — Other Ambulatory Visit: Payer: Self-pay

## 2010-06-24 DIAGNOSIS — N184 Chronic kidney disease, stage 4 (severe): Secondary | ICD-10-CM | POA: Insufficient documentation

## 2010-06-24 DIAGNOSIS — D638 Anemia in other chronic diseases classified elsewhere: Secondary | ICD-10-CM | POA: Insufficient documentation

## 2010-06-24 LAB — POCT HEMOGLOBIN-HEMACUE: Hemoglobin: 11.6 g/dL — ABNORMAL LOW (ref 12.0–15.0)

## 2010-06-24 LAB — RENAL FUNCTION PANEL
Albumin: 4.1 g/dL (ref 3.5–5.2)
BUN: 29 mg/dL — ABNORMAL HIGH (ref 6–23)
Calcium: 10 mg/dL (ref 8.4–10.5)
Chloride: 104 mEq/L (ref 96–112)
Creatinine, Ser: 1.92 mg/dL — ABNORMAL HIGH (ref 0.4–1.2)
GFR calc Af Amer: 32 mL/min — ABNORMAL LOW (ref >60)
GFR calc non Af Amer: 26 mL/min — ABNORMAL LOW (ref >60)

## 2010-06-28 ENCOUNTER — Ambulatory Visit
Admission: RE | Admit: 2010-06-28 | Discharge: 2010-06-28 | Disposition: A | Discharge status: Home / Self Care | Payer: Medicare Other | Source: Ambulatory Visit | Attending: Neurology | Admitting: Neurology

## 2010-06-28 ENCOUNTER — Other Ambulatory Visit: Payer: Self-pay | Admitting: Neurology

## 2010-06-28 DIAGNOSIS — M25559 Pain in unspecified hip: Secondary | ICD-10-CM

## 2010-07-15 ENCOUNTER — Other Ambulatory Visit: Payer: Self-pay | Admitting: Nephrology

## 2010-07-15 ENCOUNTER — Encounter (HOSPITAL_COMMUNITY): Payer: Medicare Other | Attending: Nephrology

## 2010-07-15 ENCOUNTER — Other Ambulatory Visit: Payer: Self-pay

## 2010-07-15 DIAGNOSIS — N184 Chronic kidney disease, stage 4 (severe): Secondary | ICD-10-CM | POA: Insufficient documentation

## 2010-07-15 DIAGNOSIS — Z940 Kidney transplant status: Secondary | ICD-10-CM | POA: Insufficient documentation

## 2010-07-15 DIAGNOSIS — D638 Anemia in other chronic diseases classified elsewhere: Secondary | ICD-10-CM | POA: Insufficient documentation

## 2010-07-15 LAB — IRON AND TIBC
Iron: 111 ug/dL (ref 42–135)
TIBC: 214 ug/dL — ABNORMAL LOW (ref 250–470)
UIBC: 103 ug/dL

## 2010-07-15 LAB — RENAL FUNCTION PANEL
BUN: 31 mg/dL — ABNORMAL HIGH (ref 6–23)
Creatinine, Ser: 1.93 mg/dL — ABNORMAL HIGH (ref 0.4–1.2)
Glucose, Bld: 111 mg/dL — ABNORMAL HIGH (ref 70–99)
Phosphorus: 4.3 mg/dL (ref 2.3–4.6)
Potassium: 4 mEq/L (ref 3.5–5.1)

## 2010-07-16 LAB — TACROLIMUS LEVEL: Tacrolimus (FK506) - LabCorp: 5.4 ng/mL

## 2010-07-19 LAB — POCT HEMOGLOBIN-HEMACUE: Hemoglobin: 10.8 g/dL — ABNORMAL LOW (ref 12.0–15.0)

## 2010-07-21 LAB — RENAL FUNCTION PANEL
Albumin: 3.9 g/dL (ref 3.5–5.2)
CO2: 26 mEq/L (ref 19–32)
Chloride: 100 mEq/L (ref 96–112)
GFR calc Af Amer: 39 mL/min — ABNORMAL LOW (ref >60)
GFR calc non Af Amer: 32 mL/min — ABNORMAL LOW (ref >60)
Potassium: 4.6 mEq/L (ref 3.5–5.1)
Sodium: 135 mEq/L (ref 135–145)

## 2010-07-21 LAB — URINE MICROSCOPIC-ADD ON

## 2010-07-21 LAB — URINALYSIS, ROUTINE W REFLEX MICROSCOPIC
Nitrite: NEGATIVE
Protein, ur: NEGATIVE mg/dL
Specific Gravity, Urine: 1.012 (ref 1.005–1.030)
Urobilinogen, UA: 0.2 mg/dL (ref 0.0–1.0)

## 2010-07-21 LAB — POCT HEMOGLOBIN-HEMACUE
Hemoglobin: 10.7 g/dL — ABNORMAL LOW (ref 12.0–15.0)
Hemoglobin: 11.6 g/dL — ABNORMAL LOW (ref 12.0–15.0)

## 2010-07-21 LAB — URINE CULTURE
Colony Count: NO GROWTH
Culture  Setup Time: 201111171320
Culture: NO GROWTH

## 2010-07-21 LAB — IRON AND TIBC
Iron: 116 ug/dL (ref 42–135)
Iron: 63 ug/dL (ref 42–135)
UIBC: 131 ug/dL
UIBC: 99 ug/dL

## 2010-07-21 LAB — TACROLIMUS LEVEL: Tacrolimus (FK506) - LabCorp: 2.9 ng/mL

## 2010-07-22 LAB — IRON AND TIBC
Iron: 78 ug/dL (ref 42–135)
Saturation Ratios: 39 % (ref 20–55)
TIBC: 201 ug/dL — ABNORMAL LOW (ref 250–470)
UIBC: 123 ug/dL

## 2010-07-22 LAB — POCT HEMOGLOBIN-HEMACUE: Hemoglobin: 11.2 g/dL — ABNORMAL LOW (ref 12.0–15.0)

## 2010-07-23 LAB — IRON AND TIBC
Saturation Ratios: 34 % (ref 20–55)
UIBC: 140 ug/dL

## 2010-07-23 LAB — POCT HEMOGLOBIN-HEMACUE: Hemoglobin: 11.8 g/dL — ABNORMAL LOW (ref 12.0–15.0)

## 2010-07-24 LAB — POCT HEMOGLOBIN-HEMACUE: Hemoglobin: 11 g/dL — ABNORMAL LOW (ref 12.0–15.0)

## 2010-07-25 LAB — POCT HEMOGLOBIN-HEMACUE
Hemoglobin: 10.1 g/dL — ABNORMAL LOW (ref 12.0–15.0)
Hemoglobin: 10.7 g/dL — ABNORMAL LOW (ref 12.0–15.0)
Hemoglobin: 9.8 g/dL — ABNORMAL LOW (ref 12.0–15.0)

## 2010-07-25 LAB — COMPREHENSIVE METABOLIC PANEL
ALT: 9 U/L (ref 0–35)
AST: 33 U/L (ref 0–37)
Alkaline Phosphatase: 141 U/L — ABNORMAL HIGH (ref 39–117)
CO2: 26 mEq/L (ref 19–32)
Calcium: 10 mg/dL (ref 8.4–10.5)
Chloride: 104 mEq/L (ref 96–112)
GFR calc Af Amer: 36 mL/min — ABNORMAL LOW (ref >60)
GFR calc non Af Amer: 30 mL/min — ABNORMAL LOW (ref >60)
Glucose, Bld: 103 mg/dL — ABNORMAL HIGH (ref 70–99)
Potassium: 5.6 mEq/L — ABNORMAL HIGH (ref 3.5–5.1)
Sodium: 141 mEq/L (ref 135–145)
Total Bilirubin: 1 mg/dL (ref 0.3–1.2)

## 2010-07-25 LAB — URINALYSIS, ROUTINE W REFLEX MICROSCOPIC
Bilirubin Urine: NEGATIVE
Ketones, ur: NEGATIVE mg/dL
Nitrite: NEGATIVE
Protein, ur: NEGATIVE mg/dL
Urobilinogen, UA: 0.2 mg/dL (ref 0.0–1.0)

## 2010-07-25 LAB — URINE CULTURE: Colony Count: 6000

## 2010-07-25 LAB — DIFFERENTIAL
Basophils Relative: 2 % — ABNORMAL HIGH (ref 0–1)
Eosinophils Absolute: 0.1 10*3/uL (ref 0.0–0.7)
Eosinophils Relative: 1 % (ref 0–5)
Lymphs Abs: 1.7 10*3/uL (ref 0.7–4.0)
Neutrophils Relative %: 76 % (ref 43–77)

## 2010-07-25 LAB — CBC
HCT: 36.3 % (ref 36.0–46.0)
Hemoglobin: 12 g/dL (ref 12.0–15.0)
RBC: 4.03 MIL/uL (ref 3.87–5.11)

## 2010-07-25 LAB — LIPASE, BLOOD: Lipase: 61 U/L (ref 23–300)

## 2010-07-25 LAB — POTASSIUM: Potassium: 4.3 mEq/L (ref 3.5–5.1)

## 2010-07-25 LAB — IRON AND TIBC
Iron: 72 ug/dL (ref 42–135)
UIBC: 130 ug/dL

## 2010-07-26 LAB — COMPREHENSIVE METABOLIC PANEL
AST: 79 U/L — ABNORMAL HIGH (ref 0–37)
Albumin: 3.5 g/dL (ref 3.5–5.2)
Albumin: 4 g/dL (ref 3.5–5.2)
BUN: 15 mg/dL (ref 6–23)
BUN: 25 mg/dL — ABNORMAL HIGH (ref 6–23)
Calcium: 8.7 mg/dL (ref 8.4–10.5)
Calcium: 9.4 mg/dL (ref 8.4–10.5)
Chloride: 98 mEq/L (ref 96–112)
Creatinine, Ser: 1.37 mg/dL — ABNORMAL HIGH (ref 0.4–1.2)
Creatinine, Ser: 1.57 mg/dL — ABNORMAL HIGH (ref 0.4–1.2)
GFR calc Af Amer: 40 mL/min — ABNORMAL LOW (ref >60)
GFR calc Af Amer: 47 mL/min — ABNORMAL LOW (ref >60)
Total Bilirubin: 0.3 mg/dL (ref 0.3–1.2)
Total Bilirubin: 0.5 mg/dL (ref 0.3–1.2)
Total Protein: 6 g/dL (ref 6.0–8.3)

## 2010-07-26 LAB — CBC
HCT: 35.4 % — ABNORMAL LOW (ref 36.0–46.0)
HCT: 36.6 % (ref 36.0–46.0)
Hemoglobin: 11.8 g/dL — ABNORMAL LOW (ref 12.0–15.0)
MCHC: 32.3 g/dL (ref 30.0–36.0)
MCHC: 32.6 g/dL (ref 30.0–36.0)
MCHC: 32.3 g/dL (ref 30.0–36.0)
MCHC: 33.8 g/dL (ref 30.0–36.0)
MCV: 90.2 fL (ref 78.0–100.0)
MCV: 91.6 fL (ref 78.0–100.0)
Platelets: 123 10*3/uL — ABNORMAL LOW (ref 150–400)
Platelets: 131 10*3/uL — ABNORMAL LOW (ref 150–400)
Platelets: 140 10*3/uL — ABNORMAL LOW (ref 150–400)
RBC: 3.79 MIL/uL — ABNORMAL LOW (ref 3.87–5.11)
RBC: 3.94 MIL/uL (ref 3.87–5.11)
RBC: 3.81 MIL/uL — ABNORMAL LOW (ref 3.87–5.11)
RDW: 15.2 % (ref 11.5–15.5)
RDW: 15.4 % (ref 11.5–15.5)
WBC: 11.5 10*3/uL — ABNORMAL HIGH (ref 4.0–10.5)
WBC: 11.5 10*3/uL — ABNORMAL HIGH (ref 4.0–10.5)
WBC: 11.3 10*3/uL — ABNORMAL HIGH (ref 4.0–10.5)
WBC: 7.7 10*3/uL (ref 4.0–10.5)

## 2010-07-26 LAB — BASIC METABOLIC PANEL
BUN: 13 mg/dL (ref 6–23)
CO2: 23 mEq/L (ref 19–32)
CO2: 26 mEq/L (ref 19–32)
CO2: 27 mEq/L (ref 19–32)
Calcium: 8.4 mg/dL (ref 8.4–10.5)
Calcium: 8.5 mg/dL (ref 8.4–10.5)
Calcium: 8.8 mg/dL (ref 8.4–10.5)
Creatinine, Ser: 1.37 mg/dL — ABNORMAL HIGH (ref 0.4–1.2)
Creatinine, Ser: 1.43 mg/dL — ABNORMAL HIGH (ref 0.4–1.2)
Creatinine, Ser: 1.44 mg/dL — ABNORMAL HIGH (ref 0.4–1.2)
GFR calc Af Amer: 44 mL/min — ABNORMAL LOW (ref >60)
GFR calc Af Amer: 44 mL/min — ABNORMAL LOW (ref >60)
GFR calc Af Amer: 47 mL/min — ABNORMAL LOW (ref >60)
GFR calc non Af Amer: 39 mL/min — ABNORMAL LOW (ref >60)
Sodium: 135 mEq/L (ref 135–145)
Sodium: 137 mEq/L (ref 135–145)

## 2010-07-26 LAB — URINALYSIS, ROUTINE W REFLEX MICROSCOPIC
Nitrite: NEGATIVE
Specific Gravity, Urine: 1.022 (ref 1.005–1.030)
Urobilinogen, UA: 0.2 mg/dL (ref 0.0–1.0)
pH: 5.5 (ref 5.0–8.0)

## 2010-07-26 LAB — MAGNESIUM
Magnesium: 1.5 mg/dL (ref 1.5–2.5)
Magnesium: 1.5 mg/dL (ref 1.5–2.5)

## 2010-07-26 LAB — URINE MICROSCOPIC-ADD ON

## 2010-07-26 LAB — IRON AND TIBC
Iron: 80 ug/dL (ref 42–135)
Saturation Ratios: 36 % (ref 20–55)
TIBC: 225 ug/dL — ABNORMAL LOW (ref 250–470)
UIBC: 145 ug/dL

## 2010-07-26 LAB — HEMOCCULT GUIAC POC 1CARD (OFFICE)
Fecal Occult Bld: NEGATIVE
Fecal Occult Bld: NEGATIVE
Fecal Occult Bld: NEGATIVE

## 2010-07-26 LAB — RENAL FUNCTION PANEL
CO2: 21 mEq/L (ref 19–32)
Calcium: 8.6 mg/dL (ref 8.4–10.5)
Chloride: 107 mEq/L (ref 96–112)
GFR calc Af Amer: 51 mL/min — ABNORMAL LOW (ref >60)
GFR calc non Af Amer: 42 mL/min — ABNORMAL LOW (ref >60)
Potassium: 3.7 mEq/L (ref 3.5–5.1)
Sodium: 136 mEq/L (ref 135–145)

## 2010-07-26 LAB — DIFFERENTIAL
Basophils Absolute: 0 10*3/uL (ref 0.0–0.1)
Lymphocytes Relative: 21 % (ref 12–46)
Lymphs Abs: 1.6 10*3/uL (ref 0.7–4.0)
Monocytes Absolute: 0.8 10*3/uL (ref 0.1–1.0)
Neutro Abs: 5.2 10*3/uL (ref 1.7–7.7)

## 2010-07-26 LAB — LIPASE, BLOOD: Lipase: 18 U/L (ref 11–59)

## 2010-07-26 LAB — ACTH: C206 ACTH: 12 pg/mL (ref 10–46)

## 2010-07-26 LAB — ACETYLCHOLINE RECEPTOR, BINDING: Acetylcholine Receptor Ab: <0.3 nmol/L (ref <=0.30)

## 2010-07-26 LAB — PHOSPHORUS: Phosphorus: 3.4 mg/dL (ref 2.3–4.6)

## 2010-07-27 LAB — POCT HEMOGLOBIN-HEMACUE
Hemoglobin: 11.3 g/dL — ABNORMAL LOW (ref 12.0–15.0)
Hemoglobin: 10.6 g/dL — ABNORMAL LOW (ref 12.0–15.0)

## 2010-07-27 LAB — IRON AND TIBC: Iron: 54 ug/dL (ref 42–135)

## 2010-07-30 LAB — IRON AND TIBC
Saturation Ratios: 58 % — ABNORMAL HIGH (ref 20–55)
TIBC: 217 ug/dL — ABNORMAL LOW (ref 250–470)

## 2010-07-30 LAB — POCT HEMOGLOBIN-HEMACUE: Hemoglobin: 11.7 g/dL — ABNORMAL LOW (ref 12.0–15.0)

## 2010-08-02 LAB — BASIC METABOLIC PANEL
BUN: 21 mg/dL (ref 6–23)
Creatinine, Ser: 1.39 mg/dL — ABNORMAL HIGH (ref 0.4–1.2)
GFR calc non Af Amer: 38 mL/min — ABNORMAL LOW (ref >60)
Potassium: 4.2 mEq/L (ref 3.5–5.1)

## 2010-08-02 LAB — IRON AND TIBC: UIBC: 126 ug/dL

## 2010-08-02 LAB — POCT HEMOGLOBIN-HEMACUE
Hemoglobin: 10 g/dL — ABNORMAL LOW (ref 12.0–15.0)
Hemoglobin: 12 g/dL (ref 12.0–15.0)

## 2010-08-02 LAB — CBC
Platelets: 127 10*3/uL — ABNORMAL LOW (ref 150–400)
WBC: 9.8 10*3/uL (ref 4.0–10.5)

## 2010-08-05 ENCOUNTER — Encounter (HOSPITAL_COMMUNITY): Payer: Medicare Other

## 2010-08-05 ENCOUNTER — Other Ambulatory Visit: Payer: Self-pay | Admitting: Nephrology

## 2010-08-05 LAB — POCT HEMOGLOBIN-HEMACUE: Hemoglobin: 11.4 g/dL — ABNORMAL LOW (ref 12.0–15.0)

## 2010-08-09 LAB — COMPREHENSIVE METABOLIC PANEL
ALT: 16 U/L (ref 0–35)
Albumin: 3.8 g/dL (ref 3.5–5.2)
Alkaline Phosphatase: 109 U/L (ref 39–117)
GFR calc Af Amer: 39 mL/min — ABNORMAL LOW (ref >60)
Potassium: 4.4 mEq/L (ref 3.5–5.1)
Sodium: 137 mEq/L (ref 135–145)
Total Protein: 6.7 g/dL (ref 6.0–8.3)

## 2010-08-09 LAB — POCT HEMOGLOBIN-HEMACUE: Hemoglobin: 12.5 g/dL (ref 12.0–15.0)

## 2010-08-10 LAB — CBC
HCT: 35 % — ABNORMAL LOW (ref 36.0–46.0)
Hemoglobin: 11.7 g/dL — ABNORMAL LOW (ref 12.0–15.0)
MCHC: 33.3 g/dL (ref 30.0–36.0)
RDW: 14.8 % (ref 11.5–15.5)

## 2010-08-10 LAB — URINALYSIS, ROUTINE W REFLEX MICROSCOPIC
Hgb urine dipstick: NEGATIVE
Nitrite: NEGATIVE
Protein, ur: NEGATIVE mg/dL
Urobilinogen, UA: 0.2 mg/dL (ref 0.0–1.0)

## 2010-08-10 LAB — BASIC METABOLIC PANEL
BUN: 26 mg/dL — ABNORMAL HIGH (ref 6–23)
CO2: 26 mEq/L (ref 19–32)
Calcium: 9.4 mg/dL (ref 8.4–10.5)
Glucose, Bld: 92 mg/dL (ref 70–99)
Potassium: 4 mEq/L (ref 3.5–5.1)
Sodium: 139 mEq/L (ref 135–145)

## 2010-08-10 LAB — URINE MICROSCOPIC-ADD ON

## 2010-08-10 LAB — POCT CARDIAC MARKERS
CKMB, poc: 1.5 ng/mL (ref 1.0–8.0)
Troponin i, poc: <0.05 ng/mL (ref 0.00–0.09)

## 2010-08-10 LAB — DIFFERENTIAL
Basophils Absolute: 0 10*3/uL (ref 0.0–0.1)
Basophils Relative: 0 % (ref 0–1)
Eosinophils Relative: 2 % (ref 0–5)
Monocytes Absolute: 0.9 10*3/uL (ref 0.1–1.0)

## 2010-08-11 LAB — IRON AND TIBC
Iron: 100 ug/dL (ref 42–135)
Saturation Ratios: 47 % (ref 20–55)
TIBC: 215 ug/dL — ABNORMAL LOW (ref 250–470)

## 2010-08-13 LAB — CBC
Hemoglobin: 11.1 g/dL — ABNORMAL LOW (ref 12.0–15.0)
RBC: 3.65 MIL/uL — ABNORMAL LOW (ref 3.87–5.11)
RDW: 13.9 % (ref 11.5–15.5)

## 2010-08-13 LAB — IRON AND TIBC
Iron: 88 ug/dL (ref 42–135)
Iron: 91 ug/dL (ref 42–135)
TIBC: 259 ug/dL (ref 250–470)
UIBC: 148 ug/dL

## 2010-08-13 LAB — POCT HEMOGLOBIN-HEMACUE
Hemoglobin: 11.9 g/dL — ABNORMAL LOW (ref 12.0–15.0)
Hemoglobin: 13.8 g/dL (ref 12.0–15.0)

## 2010-08-15 LAB — POCT HEMOGLOBIN-HEMACUE: Hemoglobin: 9.6 g/dL — ABNORMAL LOW (ref 12.0–15.0)

## 2010-08-15 LAB — IRON AND TIBC: Saturation Ratios: 39 % (ref 20–55)

## 2010-08-17 LAB — RENAL FUNCTION PANEL
Albumin: 3.7 g/dL (ref 3.5–5.2)
Calcium: 9.2 mg/dL (ref 8.4–10.5)
Chloride: 101 mEq/L (ref 96–112)
Creatinine, Ser: 1.6 mg/dL — ABNORMAL HIGH (ref 0.4–1.2)
GFR calc Af Amer: 39 mL/min — ABNORMAL LOW (ref >60)
GFR calc non Af Amer: 32 mL/min — ABNORMAL LOW (ref >60)

## 2010-08-18 LAB — COMPREHENSIVE METABOLIC PANEL
AST: 20 U/L (ref 0–37)
BUN: 21 mg/dL (ref 6–23)
CO2: 25 mEq/L (ref 19–32)
Calcium: 9.5 mg/dL (ref 8.4–10.5)
Creatinine, Ser: 2.26 mg/dL — ABNORMAL HIGH (ref 0.4–1.2)
GFR calc Af Amer: 26 mL/min — ABNORMAL LOW (ref >60)
GFR calc non Af Amer: 22 mL/min — ABNORMAL LOW (ref >60)

## 2010-08-18 LAB — CBC
HCT: 35.3 % — ABNORMAL LOW (ref 36.0–46.0)
MCHC: 33.9 g/dL (ref 30.0–36.0)
MCV: 91 fL (ref 78.0–100.0)
MCV: 92.1 fL (ref 78.0–100.0)
MCV: 92.2 fL (ref 78.0–100.0)
Platelets: 122 10*3/uL — ABNORMAL LOW (ref 150–400)
Platelets: UNDETERMINED 10*3/uL (ref 150–400)
RBC: 3.83 MIL/uL — ABNORMAL LOW (ref 3.87–5.11)
RBC: 3.99 MIL/uL (ref 3.87–5.11)
RBC: 4.26 MIL/uL (ref 3.87–5.11)
WBC: 10.6 10*3/uL — ABNORMAL HIGH (ref 4.0–10.5)
WBC: 11.4 10*3/uL — ABNORMAL HIGH (ref 4.0–10.5)

## 2010-08-18 LAB — DIFFERENTIAL
Basophils Relative: 1 % (ref 0–1)
Eosinophils Absolute: 0.2 10*3/uL (ref 0.0–0.7)
Lymphs Abs: 3.5 10*3/uL (ref 0.7–4.0)
Monocytes Absolute: 0.6 10*3/uL (ref 0.1–1.0)
Neutrophils Relative %: 72 % (ref 43–77)

## 2010-08-18 LAB — BASIC METABOLIC PANEL
CO2: 23 mEq/L (ref 19–32)
Calcium: 9.6 mg/dL (ref 8.4–10.5)
Chloride: 100 mEq/L (ref 96–112)
Chloride: 98 mEq/L (ref 96–112)
Creatinine, Ser: 1.76 mg/dL — ABNORMAL HIGH (ref 0.4–1.2)
GFR calc Af Amer: 28 mL/min — ABNORMAL LOW (ref >60)
GFR calc Af Amer: 30 mL/min — ABNORMAL LOW (ref >60)
GFR calc Af Amer: 35 mL/min — ABNORMAL LOW (ref >60)
GFR calc non Af Amer: 25 mL/min — ABNORMAL LOW (ref >60)
Potassium: 4.6 mEq/L (ref 3.5–5.1)
Potassium: 5.1 mEq/L (ref 3.5–5.1)
Sodium: 133 mEq/L — ABNORMAL LOW (ref 135–145)
Sodium: 135 mEq/L (ref 135–145)

## 2010-08-18 LAB — TACROLIMUS LEVEL: Tacrolimus (FK506) - LabCorp: 4.7 ng/mL

## 2010-08-18 LAB — RENAL FUNCTION PANEL
Albumin: 3.7 g/dL (ref 3.5–5.2)
BUN: 21 mg/dL (ref 6–23)
CO2: 26 mEq/L (ref 19–32)
Chloride: 99 mEq/L (ref 96–112)
Creatinine, Ser: 1.84 mg/dL — ABNORMAL HIGH (ref 0.4–1.2)
GFR calc non Af Amer: 28 mL/min — ABNORMAL LOW (ref >60)
Potassium: 4.9 mEq/L (ref 3.5–5.1)

## 2010-08-18 LAB — URINALYSIS, ROUTINE W REFLEX MICROSCOPIC
Bilirubin Urine: NEGATIVE
Ketones, ur: NEGATIVE mg/dL
Nitrite: POSITIVE — AB
Protein, ur: NEGATIVE mg/dL
Specific Gravity, Urine: 1.014 (ref 1.005–1.030)
Urobilinogen, UA: 1 mg/dL (ref 0.0–1.0)

## 2010-08-18 LAB — URINE CULTURE: Colony Count: 3000

## 2010-08-18 LAB — TSH: TSH: 124.212 u[IU]/mL — ABNORMAL HIGH (ref 0.350–4.500)

## 2010-08-18 LAB — T4, FREE: Free T4: 0.19 ng/dL — ABNORMAL LOW (ref 0.80–1.80)

## 2010-08-18 LAB — T3, FREE: T3, Free: 1 pg/mL — ABNORMAL LOW (ref 2.3–4.2)

## 2010-08-19 LAB — LIPASE, BLOOD: Lipase: 19 U/L (ref 11–59)

## 2010-08-19 LAB — CBC
HCT: 32.5 % — ABNORMAL LOW (ref 36.0–46.0)
HCT: 36.1 % (ref 36.0–46.0)
Hemoglobin: 11.2 g/dL — ABNORMAL LOW (ref 12.0–15.0)
Hemoglobin: 10.2 g/dL — ABNORMAL LOW (ref 12.0–15.0)
Hemoglobin: 11.1 g/dL — ABNORMAL LOW (ref 12.0–15.0)
Hemoglobin: 12.9 g/dL (ref 12.0–15.0)
MCHC: 33.8 g/dL (ref 30.0–36.0)
MCHC: 34 g/dL (ref 30.0–36.0)
MCHC: 34 g/dL (ref 30.0–36.0)
MCHC: 34.1 g/dL (ref 30.0–36.0)
MCV: 93.2 fL (ref 78.0–100.0)
MCV: 94.6 fL (ref 78.0–100.0)
Platelets: 124 10*3/uL — ABNORMAL LOW (ref 150–400)
Platelets: 165 10*3/uL (ref 150–400)
RBC: 3.47 MIL/uL — ABNORMAL LOW (ref 3.87–5.11)
RBC: 3.82 MIL/uL — ABNORMAL LOW (ref 3.87–5.11)
RBC: 3.91 MIL/uL (ref 3.87–5.11)
RDW: 14 % (ref 11.5–15.5)
RDW: 14.1 % (ref 11.5–15.5)
RDW: 14.2 % (ref 11.5–15.5)
RDW: 14.4 % (ref 11.5–15.5)
RDW: 14.6 % (ref 11.5–15.5)
WBC: 8.2 10*3/uL (ref 4.0–10.5)

## 2010-08-19 LAB — CULTURE, BLOOD (ROUTINE X 2): Culture: NO GROWTH

## 2010-08-19 LAB — URINALYSIS, ROUTINE W REFLEX MICROSCOPIC
Bilirubin Urine: NEGATIVE
Glucose, UA: NEGATIVE mg/dL
Glucose, UA: NEGATIVE mg/dL
Glucose, UA: NEGATIVE mg/dL
Hgb urine dipstick: NEGATIVE
Hgb urine dipstick: NEGATIVE
Ketones, ur: 15 mg/dL — AB
Ketones, ur: NEGATIVE mg/dL
Specific Gravity, Urine: 1.02 (ref 1.005–1.030)
pH: 5 (ref 5.0–8.0)
pH: 5.5 (ref 5.0–8.0)
pH: 5.5 (ref 5.0–8.0)

## 2010-08-19 LAB — COMPREHENSIVE METABOLIC PANEL
ALT: 15 U/L (ref 0–35)
ALT: 17 U/L (ref 0–35)
AST: 18 U/L (ref 0–37)
Albumin: 4 g/dL (ref 3.5–5.2)
CO2: 29 mEq/L (ref 19–32)
Calcium: 8.5 mg/dL (ref 8.4–10.5)
Calcium: 9.6 mg/dL (ref 8.4–10.5)
Calcium: 9.8 mg/dL (ref 8.4–10.5)
Creatinine, Ser: 1.52 mg/dL — ABNORMAL HIGH (ref 0.4–1.2)
GFR calc Af Amer: 42 mL/min — ABNORMAL LOW (ref >60)
GFR calc Af Amer: 53 mL/min — ABNORMAL LOW (ref >60)
GFR calc non Af Amer: 34 mL/min — ABNORMAL LOW (ref >60)
Glucose, Bld: 102 mg/dL — ABNORMAL HIGH (ref 70–99)
Glucose, Bld: 97 mg/dL (ref 70–99)
Glucose, Bld: 97 mg/dL (ref 70–99)
Potassium: 4.1 mEq/L (ref 3.5–5.1)
Sodium: 136 mEq/L (ref 135–145)
Sodium: 136 mEq/L (ref 135–145)
Sodium: 137 mEq/L (ref 135–145)
Total Protein: 5.7 g/dL — ABNORMAL LOW (ref 6.0–8.3)
Total Protein: 6.8 g/dL (ref 6.0–8.3)
Total Protein: 7.4 g/dL (ref 6.0–8.3)

## 2010-08-19 LAB — CK TOTAL AND CKMB (NOT AT ARMC)
CK, MB: 3 ng/mL (ref 0.3–4.0)
Relative Index: INVALID (ref 0.0–2.5)

## 2010-08-19 LAB — DIFFERENTIAL
Eosinophils Absolute: 0.1 10*3/uL (ref 0.0–0.7)
Lymphocytes Relative: 12 % (ref 12–46)
Lymphs Abs: 1.6 10*3/uL (ref 0.7–4.0)
Lymphs Abs: 1.7 10*3/uL (ref 0.7–4.0)
Monocytes Absolute: 1.5 10*3/uL — ABNORMAL HIGH (ref 0.1–1.0)
Monocytes Relative: 11 % (ref 3–12)
Monocytes Relative: 8 % (ref 3–12)
Neutrophils Relative %: 76 % (ref 43–77)
Neutrophils Relative %: 79 % — ABNORMAL HIGH (ref 43–77)

## 2010-08-19 LAB — BASIC METABOLIC PANEL
BUN: 15 mg/dL (ref 6–23)
CO2: 27 mEq/L (ref 19–32)
Calcium: 9.2 mg/dL (ref 8.4–10.5)
Calcium: 9.1 mg/dL (ref 8.4–10.5)
Chloride: 101 mEq/L (ref 96–112)
Creatinine, Ser: 1.37 mg/dL — ABNORMAL HIGH (ref 0.4–1.2)
GFR calc Af Amer: 51 mL/min — ABNORMAL LOW (ref >60)
GFR calc Af Amer: 54 mL/min — ABNORMAL LOW (ref >60)
GFR calc non Af Amer: 42 mL/min — ABNORMAL LOW (ref >60)
Glucose, Bld: 100 mg/dL — ABNORMAL HIGH (ref 70–99)
Glucose, Bld: 79 mg/dL (ref 70–99)
Potassium: 5 mEq/L (ref 3.5–5.1)
Potassium: 4 mEq/L (ref 3.5–5.1)
Sodium: 138 mEq/L (ref 135–145)

## 2010-08-19 LAB — URINE MICROSCOPIC-ADD ON

## 2010-08-19 LAB — LIPID PANEL
Total CHOL/HDL Ratio: 3.6 RATIO
VLDL: 18 mg/dL (ref 0–40)

## 2010-08-19 LAB — TSH: TSH: 3.015 u[IU]/mL (ref 0.350–4.500)

## 2010-08-19 LAB — TROPONIN I: Troponin I: <0.01 ng/mL (ref 0.00–0.06)

## 2010-08-19 LAB — URINE CULTURE
Colony Count: NO GROWTH
Special Requests: NEGATIVE

## 2010-08-19 LAB — CARDIAC PANEL(CRET KIN+CKTOT+MB+TROPI)
Relative Index: INVALID (ref 0.0–2.5)
Total CK: 61 U/L (ref 7–177)
Troponin I: <0.01 ng/mL (ref 0.00–0.06)

## 2010-08-19 LAB — BRAIN NATRIURETIC PEPTIDE: Pro B Natriuretic peptide (BNP): 81 pg/mL (ref 0.0–100.0)

## 2010-08-23 LAB — COMPREHENSIVE METABOLIC PANEL
ALT: 18 U/L (ref 0–35)
AST: 22 U/L (ref 0–37)
Albumin: 4.5 g/dL (ref 3.5–5.2)
Calcium: 9.7 mg/dL (ref 8.4–10.5)
Creatinine, Ser: 1.2 mg/dL (ref 0.4–1.2)
GFR calc Af Amer: 55 mL/min — ABNORMAL LOW (ref >60)
Sodium: 142 mEq/L (ref 135–145)
Total Protein: 7.9 g/dL (ref 6.0–8.3)

## 2010-08-23 LAB — URINALYSIS, ROUTINE W REFLEX MICROSCOPIC
Bilirubin Urine: NEGATIVE
Glucose, UA: NEGATIVE mg/dL
Ketones, ur: NEGATIVE mg/dL
Protein, ur: NEGATIVE mg/dL

## 2010-08-23 LAB — CBC
Hemoglobin: 11.5 g/dL — ABNORMAL LOW (ref 12.0–15.0)
MCHC: 32.8 g/dL (ref 30.0–36.0)
RBC: 3.7 MIL/uL — ABNORMAL LOW (ref 3.87–5.11)
WBC: 13.3 10*3/uL — ABNORMAL HIGH (ref 4.0–10.5)

## 2010-08-23 LAB — URINE CULTURE: Culture: NO GROWTH

## 2010-08-23 LAB — DIFFERENTIAL
Eosinophils Absolute: 0.1 10*3/uL (ref 0.0–0.7)
Eosinophils Relative: 1 % (ref 0–5)
Lymphocytes Relative: 18 % (ref 12–46)
Lymphs Abs: 2.3 10*3/uL (ref 0.7–4.0)
Monocytes Relative: 5 % (ref 3–12)

## 2010-08-26 ENCOUNTER — Encounter (HOSPITAL_COMMUNITY): Payer: Medicare Other | Attending: Nephrology

## 2010-08-26 ENCOUNTER — Other Ambulatory Visit: Payer: Self-pay | Admitting: Nephrology

## 2010-08-26 DIAGNOSIS — N184 Chronic kidney disease, stage 4 (severe): Secondary | ICD-10-CM | POA: Insufficient documentation

## 2010-08-26 DIAGNOSIS — Z940 Kidney transplant status: Secondary | ICD-10-CM | POA: Insufficient documentation

## 2010-08-26 DIAGNOSIS — D638 Anemia in other chronic diseases classified elsewhere: Secondary | ICD-10-CM | POA: Insufficient documentation

## 2010-08-26 LAB — IRON AND TIBC
Saturation Ratios: 58 % — ABNORMAL HIGH (ref 20–55)
TIBC: 215 ug/dL — ABNORMAL LOW (ref 250–470)
UIBC: 91 ug/dL

## 2010-09-16 ENCOUNTER — Encounter (HOSPITAL_COMMUNITY): Payer: Medicare Other | Attending: Nephrology

## 2010-09-16 ENCOUNTER — Other Ambulatory Visit: Payer: Self-pay | Admitting: Nephrology

## 2010-09-16 DIAGNOSIS — N184 Chronic kidney disease, stage 4 (severe): Secondary | ICD-10-CM | POA: Insufficient documentation

## 2010-09-16 DIAGNOSIS — Z940 Kidney transplant status: Secondary | ICD-10-CM | POA: Insufficient documentation

## 2010-09-16 DIAGNOSIS — D638 Anemia in other chronic diseases classified elsewhere: Secondary | ICD-10-CM | POA: Insufficient documentation

## 2010-09-16 LAB — IRON AND TIBC
Iron: 139 ug/dL — ABNORMAL HIGH (ref 42–135)
TIBC: 223 ug/dL — ABNORMAL LOW (ref 250–470)

## 2010-09-21 NOTE — Consult Note (Signed)
NAME:  Amanda, Mcfarland NO.:  [id number]   MEDICAL RECORD NO.:  [id number]          PATIENT TYPE:  INP   LOCATION:  2037                         FACILITY:  [facility]   PHYSICIAN:  Ronald Lobo, M.D.   DATE OF BIRTH:  10/12/1940   DATE OF CONSULTATION:  07/18/2008  DATE OF DISCHARGE:                                 CONSULTATION   REFERRING PHYSICIAN:  Delora Fuel, MD   HISTORY OF PRESENT ILLNESS:  This is a 70 year old female who comes to  the ED today doubled over with severe retrosternal pain that she says  when it is at its worst feels like it spread to her shoulders as well as  her right leg, it feels like a heart attack.  The patient has been  fighting the same pain for about 3 months.  She has been to [facility],  [facility], and [facility] to have it evaluated.  The pain does  not seem to respond to Protonix, however, it does seem to be helped by  Dilaudid.  She has a decreased appetite.  She says she feels like she is  constantly full, has increased burping with or without food.  She has  had some emesis.  It is mostly yellow fluid.  She does have regular  brown bowel movements almost daily.  She describes no fever.  She came  to [facility] ED on Wednesday for the same symptoms.  Her Protonix was  doubled, this has not helped.  She does take a daily aspirin.  She  denies any other NSAIDs.  Her last partial colonoscopy was April 25, 2008.  It was grossly normal to the transverse colon.  She does have  colon up in her hernia sac in her lower right quadrant.  There was no  sign of active Crohn's.  Her last upper endoscopy was February 14, 2002.  She had a hiatal hernia and a normal esophagus.  The patient tells me  that she was hospitalized for approximately 1 week at [facility] in  December.  While she was there, she had a CT scan.  She also had an MRI  but she did not have an upper endoscopy or colonoscopy done.  She also  did not have a cardiac workup  while she was there.  She tells me that  her last cardiac workup was in the summer of 2009 when she was seen by  Dr. Elisabeth Cara.   PAST MEDICAL HISTORY:  Significant for Crohn disease.  She is status  post multiple abdominal surgeries.  Going through the records, it  appears that her Crohn's has been biopsied in the sigmoid and the  rectum.  She has had bilateral renal cell carcinoma.  She is status post  bilateral nephrectomy and renal transplant.  She is not on hemodialysis.  She has chronic pancreatitis that is felt to be secondary to her lupus.  She has a history of perforated duodenal ulcer.  She has external  hemorrhoids and is status post 2 hemorrhoidectomies.  She has a history  of rectal fissures.  She is status  post cholecystectomy.  She does not  have a history of diabetes mellitus.   CURRENT MEDICATIONS:  Are per chart that include Asacol, Protonix,  aspirin, Prograf, and prednisone.   ALLERGIES:  She has allergies to many antibiotics including PENICILLIN,  SEPTRA, CIPRO and E-MYCIN as well as AMOXICILLIN.   REVIEW OF SYSTEMS:  As per HPI, but the patient also tells me that when  her pain is severe, she develop palpitations.   SOCIAL HISTORY:  Negative for alcohol, negative for tobacco.   FAMILY HISTORY:  Significant for her mother who had diverticulitis.   PHYSICAL EXAMINATION:  GENERAL:  She is alert and oriented in no  apparent distress.  VITAL SIGNS:  After 3 mg of Dilaudid and 8 mg of Zofran, her temperature  is 97.2, pulse 85, respirations 20, blood pressure is 149/73.  HEART:  Regular rate and rhythm.  LUNGS:  Clear.  ABDOMEN:  Multiple C scars with a large right-sided hernia.  It is  nontender, nondistended, and she has good bowel sounds.  RECTAL:  She has tan stool and is guaiac negative.  Her anal opening is  very tight.  Her sphincter is thick.  She has a painful exam.   CURRENT LABORATORY DATA:  Hemoglobin of 12.3, hematocrit 36.5, white  count 13.4, and  platelets 148,000.  Her BUN is 24, creatinine is 1.52  that is slightly elevated from Wednesday when it was 1.23, and glucose  is 97.  Lipase is 19.  LFTs show an AST of 18, ALT is 16, alk phos 106,  and total bilirubin 0.7.  Urine shows moderate leukocyte esterase.  On  July 16, 2008, she had a radiological exam including an abdominal x-ray  that showed no obstruction.  No free air.  Her last CT at [facility] was done on April 23, 2008.  CT of her abdomen and pelvis  showed no acute findings.  She also had a CT in November that showed no  acute findings.   ASSESSMENT:  Dr. Herbie Baltimore Buccini has seen and examined the patient,  collected a history, and reviewed her chart.  His impression is, this is  a very pleasant 70 year old female with a complex past medical history  who has had retrosternal pain for 3 months with palpation.  She is not  responding to Protonix, but does seem to respond with pain relief to  Dilaudid.  Differential diagnosis certainly includes peptic disease but  also could be atypical chest pains.  The patient would likely benefit  from a cardiac workup including a stress test.   PLAN:  We will most likely perform upper endoscopy and possibly  colonoscopy this admission but would appreciate cardiac clearance prior  to these procedures.  Thanks very much for this consultation.      Melton Alar, PA    ______________________________  Ronald Lobo, M.D.    MLY/MEDQ  D:  07/18/2008  T:  07/18/2008  Job:  [id number]   cc:   Jeneen Rinks L. Rolla Flatten., M.D.  [name]. Deterding, M.D.

## 2010-09-21 NOTE — Discharge Summary (Signed)
NAME:  [name], [name]               ACCOUNT NO.:  [id number]   MEDICAL RECORD NO.:  [id number]          PATIENT TYPE:  INP   LOCATION:  6736                         FACILITY:  [facility]   PHYSICIAN:  [name]. Deterding, M.D.DATE OF BIRTH:  05/07/1941   DATE OF ADMISSION:  04/23/2008  DATE OF DISCHARGE:  04/27/2008                               DISCHARGE SUMMARY   ADMITTING DIAGNOSES:  1. Recurrent abdominal and back pain of unclear etiology.  2. 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  3. History of Crohn disease.  4. Lupus inactive.  5. Anemia of chronic disease.  6. Secondary hyperparathyroidism.  7. Hypertension.   DISCHARGE DIAGNOSES:  1. Abdominal pain resolved.  Etiology unclear with a differential      diagnosis as partial small-bowel obstruction due to hard stool and      ventral hernia, incarcerated hernia spontaneously resolving,      constipation.  2. Back pain much improved.  3. 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  4. Hypertension.  5. Anemia of chronic disease.  6. Secondary hyperparathyroidism.  7. Inactive lupus.  8. Questionable adrenal insufficiency discharged on moderate dose      prednisone for followup as outpatient.   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  She states that her back  pain and abdominal pain are both constant.  She had a recent admission  March 19, 2008  through March 21, 2008 at [facility] with  abdominal pain, nausea, and vomiting thought secondary to  gastroenteritis.  The patient returned to the ER on March 28, 2008  with CAT scan negative.  She was seen on April 07, 2008 by Dr.  Jimmy Footman in the office for follow-up and state that her abdominal pain  was still there.  She was sent to Dr. Luis Abed at [facility]  for evaluation where they thought that possibly the patient had  strangulating hernia in the bowel tract.  According to the patient she  stayed there for a week, got better and no intervention was done.  Currently, the patient states she  has loss of appetite somewhat and is  afraid to eat.  She is felt to be dry due to poor p.o. intake and is  being admitted for workup.   LABORATORY DATA ON ADMISSION:  White count 8600, hemoglobin 12.0, and  platelets 122,000.  Sodium 134, potassium 3.3, chloride 99, CO2 26,  glucose 93, BUN 9, creatinine 1.47, albumin 4.3, calcium 9.5, total  protein 7.4.  Urinalysis specific gravity 1.006, pH  of 8.0.  Urine is  yellow, clear with trace of blood, negative leukocytes, negative  nitrite, 0-2 white and red blood cells per high-power field, sed rate 8.  Reticulocyte count within normal limits at 34.8 and 0.9%.  Random  cortisol level drawn approximately 7 p.m. was within normal limits at  9.7.  Hemoglobin A1c 5.7, phosphorus 3.0.  Chest x-ray showed no acute  cardiopulmonary abnormalities.  CT of the abdomen and pelvis was  positive for hernia along the right anterior abdominal wall which was  unchanged.  She is status post cholecystectomy and bilateral  nephrectomies.  There were no other acute findings in the abdominal CT.  The pelvic CT showed a right iliac fossa renal transplant without  evidence of complication.  AV shunt in left groin was unchanged.  MRI of  the back showed no evidence of lumbar diskitis or osteomyelitis, stable  chronic changes of renal  osteodystrophy with a slightly expansile and  sclerotic lesion in the L1 spinous process appearing unchanged since  2004.  There was mild lumbar spondylosis, no disk herniation or nerve  root encroachment demonstrated and no acute osseous findings seen.   HOSPITAL COURSE:  Upon admission, the patient was put on a clear liquid  diet, given normal saline, IV fluids at a slow rate.  She was maintained  on her usual immunosuppressive therapy which included Prograf and  Myfortic.  Other workup included iron studies which showed a transferrin  saturation level of 61%.  C-reactive protein normal at 0.3.  Serum  protein electrophoresis was negative for monoclonal spike.  Stool  culture for C. difficile negative.  A Cortrosyn stimulation test was  ordered on day of admission.  Because it was done improperly, it was  reordered on April 25, 2008.  Again it was done improperly and it was  reordered on April 26, 2008 and again it was done improperly.  Meanwhile, due to the suspicion of possible adrenal insufficiency, the  patient was started on prednisone 40 mg on April 25, 2008 then  decreased to 30 mg daily.  She received 30 mg on April 26, 2008 and  again on April 27, 2008.  Due to prednisone administration, it was  felt that a Cortrosyn stimulation test was a moot point at this point.  As per instruction by Dr. Jonnie Finner, the patient is being discharged on 30  mg of prednisone daily and await instruction from Dr. Jeneen Rinks Deterding as  to in regards of tapering that medicine.   GI workup included a limited colonoscopy.  Despite 1 L of GoLYTELY,  findings were of large amount of hard stool.  There was no ischemia and  no evidence of Crohn's.  There was anal stenosis present from previous  surgery.  The tip of the scope eventually went into the hernia sac and  the procedure at that point was terminated.  Recommendations per Dr.  Oletta Lamas was to use MiraLax 2-3 times a day to soften her  stool.  If the  symptoms were to persist, he recommends a small bowel series to rule out  partial obstruction.  Colonoscopy was performed on April 25, 2008.  Over the course of the next 48 hours, her symptoms resolved.  She was  tolerating solid foods.  Even her back pain improved which was felt  possibly the effects of steroids.  The patient is somewhat resistant to  the diagnosis of constipation despite finding of hard stools after one  gallon of GoLYTELY.  Her abdomen  was soft with bowel sounds present,  nontender on palpation of the soft ventral hernia.   Renal transplant function remained stable with creatinine at 1.4  essentially each day.  BUN was 13, potassium 3.9 on day of discharge.  Her hemoglobin was also stable at 11.2.  Her immunosuppressive therapy  remained the same regarding Prograf and Myfortic  As mentioned earlier,  prednisone was started due to a suspicion of adrenal insufficiency.  It  is unfortunate that the Cortrosyn stimulation test was incorrectly  carried out on two consecutive mornings and in meanwhile, she was  already receiving steroid therapy, therefore was ultimately cancelled.  She will follow up with Dr. Jimmy Footman for instructions of prednisone  tapering.   DISCHARGE MEDICATIONS:  1. Myfortic 360 mg b.i.d.  2. Prograf 4 mg b.i.d.  3. Protonix 40 mg daily.  4. Toprol 100 mg daily.  5. Hectorol 2.5 mcg daily.  6. Asacol 400 mg b.i.d.  7. MiraLax 17 g in 8 ounces of water daily at least and the patient      may take up to two to three times a day.  8. Aspirin 81 mg daily.  9. Prednisone 30 mg q.a.m. with breakfast pending instructions from      Dr. Jimmy Footman to taper.      Nonah Mattes, P.A.    ______________________________  Joyice Faster. Deterding, M.D.    RRK/MEDQ  D:  04/27/2008  T:  04/28/2008  Job:  [id number]   cc:   Jeneen Rinks L. Rolla Flatten., M.D.

## 2010-09-21 NOTE — H&P (Signed)
NAME:  [name], [name]               ACCOUNT NO.:  [id number]   MEDICAL RECORD NO.:  [id number]          PATIENT TYPE:  INP   LOCATION:  6736                         FACILITY:  [facility]   PHYSICIAN:  [name]. Deterding, M.D.DATE OF BIRTH:  03-14-1941   DATE OF ADMISSION:  04/23/2008  DATE OF DISCHARGE:                              HISTORY & PHYSICAL   CHIEF COMPLAINT:  Abdominal pain and right flank pain, recurrent.   HISTORY OF PRESENT ILLNESS:  This is a 70 year old black female with end-  stage renal disease secondary to lupus status post cadaver renal  transplant in 2006 at [facility] based on  creatinines in upper 1's to 2's, also known history of renal cell  carcinoma status post bilateral nephrectomies with abdominal hernia,  Crohn's disease and Jehovah witness, now being admitted from Dr.  Deterding's office with recurrent right flank pain and abdominal pain.  She has had a history of constant back pain and abdominal pain.  Recent  admission March 19, 2008, through March 21, 2008, at [facility]  with abdominal pain, nausea, and vomiting, thought secondary to  gastroenteritis with the patient returning to the ER with CAT scan done  on March 28, 2008, which was negative.  Seen by Dr. Jimmy Footman in his  office on April 07, 2008, for followup with her abdominal pain still  there.  She was sent to [facility] to be seen by Dr. Trinna Post  and evaluated as Dr. Jimmy Footman thought there was a possibility of  strangulating hernia in the bowel tract area.  Per patient, she stayed  there for 1 week, pain got better, but nothing was found.  The  discharge summary is not found in the current paperwork.   The patient reports being anorexic somewhat with low p.o. intake over  the past several days.  Because of this and fear of dehydration and  continued abdominal pain, admitted by Dr. Jimmy Footman for further workup.   MEDICATIONS LISTED:  1. Prograf 5 mg  b.i.d.  2. Myfortic 180 mg 2 b.i.d.  3. Protonix 40 mg q.a.m.  4. Toprol-XL 100 mg daily.  5. Procardia XL 60 mg daily, on hold.  6. MiraLax p.r.n.  7. Asacol 400 mg 2 p.o. b.i.d.  8. Hectorol 2.5 mcg p.o. daily.  9. Lasix 40 mg, currently on hold.  10.Baby aspirin 1 daily.  11.Aranesp 5000 every 3 weeks, currently on hold.  12.Vicodin 1-2 q.4-6 h. p.r.n.   ALLERGIES:  To PENICILLIN, CIPRO, and SULFA listed.   PAST MEDICAL HISTORY:  1. History of SLE, currently inactive.  2. Crohn's disease status post multiple abdominal surgeries.  3. 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  4. History of chronic pancreatitis.  5. History of perforated duodenal ulcer.  6. History of Jehovah's witness with known admission in the remote      past.  7. Hemoglobin down to as much as 2.6, but improved  with Epogen and      steroids.  8. Secondary hyperparathyroidism __________ .  9. 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  10.History of severe external and internal hemorrhoids status post      hemorrhoidectomy, followed by Dr. Oletta Lamas in the past.  11.History of fissures in the colon.  12.History of constipation with narcotics.   SOCIAL HISTORY:  Lives with her husband.  No alcohol or tobacco history.   FAMILY MEDICAL HISTORY:  Noncontributory.   REVIEW OF SYSTEMS:  Weight loss with a decreased appetite.  No chills,  fevers, or sweats recently.  No shortness of breath or chest pain.  Abdominal pain as noted above.   PHYSICAL EXAMINATION:  GENERAL:  In the ER, alert, black female, in no  acute distress, and chronically ill.  VITAL SIGNS:  Temperature is 97.4, blood pressure is 155/81, pulse is  78, respirations 18, and O2 sat is 97% on room air.  HEENT:   Head is normocephalic.  Eye exam reveals pupils are PERRLA.  Extraocular movements are intact.  Sclerae is nonicteric.  Ears, nose,  and throat are grossly unremarkable.  LUNGS:  Clear to auscultation.  No wheezes or rhonchi present.  CARDIAC:  Regular rate and rhythm auscultated.  Soft. A 1/6 systolic  ejection murmur, left sternal border with an S4 noted.  ABDOMEN:  Bowel sounds positive, slightly decreased. Large ventral  hernia present.  Slightly tender to touch in the right upper and lower  quadrants but no rebound appreciated.  Right flank pain to deep  palpitation.  RECTAL:  Done by Dr. Justin Mend noted with external hemorrhoids easily  reducible.  Stricture noted to the size of a small finger tip.  EXTREMITIES:  Positive bruits in the left femoral AV Gore-Tex graft.  No  pallor or edema.  Mild paraspinal palpable tenderness, and also right  flank tenderness.   LABORATORY DATA:  Pending currently.  Had a CT scan of the abdomen and  pelvis, SPEP, UPEP, CRP, CBC, CMET, and phosphorous, also L-spine films  and MRI of the LS spine without contrast.   ASSESSMENT AND PLAN:  1. Recurrent abdominal pain and back pain, check above labs.      Questionable etiology of Crohn's disease, hernia, or infectious      disease type process.  I discussed with Dr. Justin Mend.  We will check      MRI, noncontrast of her back.  Check blood cultures x2.  2. End-stage renal disease, status post cadaver transplant with      dehydration secondary to recurrent abdominal pain and back pain.      Follow up her labs.  Give her IV fluids for rehydration.  Follow up      I's and O's, and weights.  Check lipids and blood pressures.  3. History of Crohn's disease.  Hydrate as above.  4. Lupus, inactive.  5. History of anemia, follow up hemoglobin.  6. Secondary hyperparathyroidism.  We will follow up calcium and      phosphorus.  Continue vitamin D as needed.  7. History of hypertension, currently on Toprol, it is not  needed.  8. History of remote supraventricular tachycardia, currently stable.      Blood pressure and normal sinus on exam.      Foye Clock, P.A.    ______________________________  Joyice Faster. Deterding, M.D.    DWZ/MEDQ  D:  04/23/2008  T:  04/24/2008  Job:  [id number]   cc:   [facility]  Dr. Oletta Lamas, GI

## 2010-09-21 NOTE — Discharge Summary (Signed)
[name], [name]               ACCOUNT NO.:  [id number]   MEDICAL RECORD NO.:  [id number]          PATIENT TYPE:  INP   LOCATION:  6730                         FACILITY:  [facility]   PHYSICIAN:  Sol Blazing, M.D.DATE OF BIRTH:  March 27, 1941   DATE OF ADMISSION:  09/02/2008  DATE OF DISCHARGE:  09/06/2008                               DISCHARGE SUMMARY   ADMITTING DIAGNOSES:  1. Gram-negative rod urinary tract infection.  2. Chronic myalgias and arthralgias with specifically right lower      extremity neuropathic pain ongoing for months.  3. 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  4. Hypertension.  5. Secondary hyperparathyroidism.  6. Systemic lupus erythematosus.  7. Crohn disease, stable.  8. History of paroxysmal atrial fibrillation in sinus rhythm.  9. Anxiety.   DISCHARGE DIAGNOSES:  1. Gram-negative rod urinary tract infection sensitive to Keflex.  2. Severe hypothyroidism, now on replacement therapy.  3. End-stage renal disease secondary to lupus.  4. Acute renal failure of renal transplant with creatinine returning      to baseline at 1.6 at discharge.  5. Hypertension.  6. Secondary hyperparathyroidism.  7. Systemic lupus erythematosus.  8. Chronic myalgias, arthralgias, and specifically right lower      extremity neuropathic pain, now on Neurontin.  9. Crohn disease, stable.  10.Paroxysmal atrial fibrillation in sinus rhythm.  11.Anxiety.  12.Hyperkalemia on day of discharge treated with Kayexalate.   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  His baseline creatinine has known to be approximately 1.1 to 1.2.  Has been seen repeatedly in the last 4-6 months at [facility], [facility], [place] with various complaints, mainly due to general  malaise and general arthralgias, myalgias, and neuropathic-type pain in  the lower extremities.  She has had workup at various medical centers  without specific diagnosis.   She was seen at [facility] on September 01, 2008, with  complaints of dysuria.  Urinalysis showed 25-32 white blood cells and  she was empirically started on Keflex due to her multiple drug  allergies.  Labs were done and creatinine returned at 1.9 with a  peripheral white count of 11,200, hemoglobin 11.4, platelets 104,000.  Tacrolimus level elevated at 16.25.  The pain is described as in her  right lower back and travels down around the right hip down to right  leg.  She complains of foot numbness and occasionally cold foot.  She  also has occasional nausea, some vomiting.  She has lost most of her  appetite.  She has lost weight.  She is being admitted for workup.  The  patient has known CT scans of her spine and arterial Dopplers of her  legs done in the late fall of 2009, which were negative.   LABS ON ADMISSION:  Sodium 133, potassium  4.3, chloride 97, CO2 of 25,  BUN 21, creatinine 1.76, calcium 9.6, glucose 105.  UA is yellow, clear,  moderate leukocytes, positive nitrite, trace of blood, 11-20 white  cells, 0-2 red blood cells per high-power field, peripheral white count  15,900, hemoglobin 13.2, platelets clumped.  LFTs within normal limits.  TSH returns at 124.212.  Tacrolimus trough level 4.7.   HOSPITAL COURSE:  1. The patient was admitted, started on gentle IV fluid hydration,      continued on her usual immunosuppressive therapy.  When her TSH      returned extremely high, a free T3 was drawn returned at 1.0 and a      free T4 returned at 0.19, both values low.  Synthroid was started      and dose gradually increased over the course of this      hospitalization and at time of discharge she is being sent  home on      50 mcg to be taken daily.  She will need monthly TSH levels until      her TSH is within normal limits.  2. Gram-negative rod UTI.  There was no identification of organisms;      however, sensitivities were done on the gram-negative rod and these      were sensitive to Keflex.  She will continue 500 mg b.i.d. to      complete a 10-day course.  3. Renal transplant with IV hydration and treatment of her urinary      tract infection.  Her creatinine peaked at 2.26 on September 02, 2008.      At the time of discharge, it was 1.60.  Her BUN peaked at 24, is      down to 19 at time of discharge.  GFR is estimated at 39.      Potassium surprisingly was high on day of discharge at 5.7 without      any exogenous supplementation.  She did admit to drinking a cup of      orange juice the evening before and the morning of discharge.  She      was treated with Kayexalate 60 g orally prior to leaving the      hospital.  Again, she will have followup next week at [facility].  Renal ultrasound of the allograft showed no hydro      nephrosis for diagnostic renal calculi.  The transplanted kidney in      the right lower quadrant was unremarkable.  4. General malaise and chronic pain.  CT scan of lumbar spine showed      no acute fracture or subluxation.  There was mild disk space      flattening of the L4-L5 level.  She was given p.r.n. pain      medications, in addition Lyrica was started.  At the time of      discharge, the patient requested generic and gabapentin was used at      100 mg at bedtime for starters.  This dose can be titrated up based      on symptom relief.  It is suspected that her complaints at this      point are all attributable to her severe hypothyroidism and we are      hopeful that her symptoms resolve as her TSH improves towards      normal.   DISCHARGE MEDICATIONS:  1. Amlodipine 10 mg at bedtime p.r.n., 20 pills with  2 refills given.  2. Prograf 4 mg  b.i.d.  3. Prednisone 10 mg daily.  4. Myfortic 360 mg b.i.d.  5. Amiodarone 100 mg daily.  6. Protonix 40 mg daily.  7. Lopressor 25 mg b.i.d.  8. Clonidine 0.2 mg b.i.d.  9. Aspirin 325 mg daily.  10.Calcitriol 2.5 mcg daily.  11.Procardia XL 60 mg daily.  12.MiraLax 17 g in water 2-3 times a day.  13.Gabapentin 100 mg at bedtime.  14.Levothyroxine 50 mcg 1 daily.  15.Keflex 500 mg b.i.d. to complete a 10-day course.   The patient was told that Dr. Deterding's office will call her with a  lab appointment.   LABS ON DAY OF DISCHARGE:  Sodium 132, potassium 5.7, chloride 101, CO2  of 24, glucose 93, BUN 19, creatinine 1.6, GFR 39, albumin 3.7 calcium  9.2, phosphorus 3.1.      Nonah Mattes, P.A.      Sol Blazing, M.D.  Electronically Signed    RRK/MEDQ  D:  09/06/2008  T:  09/07/2008  Job:  [id number]

## 2010-09-21 NOTE — Op Note (Signed)
NAME:  [name], [name]               ACCOUNT NO.:  [id number]   MEDICAL RECORD NO.:  [id number]           PATIENT TYPE:   LOCATION:                                 FACILITY:   PHYSICIAN:  John C. Amedeo Plenty, M.D.    DATE OF BIRTH:  1940/11/27   DATE OF PROCEDURE:  07/21/2008  DATE OF DISCHARGE:                               OPERATIVE REPORT   INDICATIONS FOR PROCEDURE:  Patient with multiple medical problems with  unexplained abdominal and chest pain.  CT angiogram was negative.  The  patient has had a cholecystectomy of the past.  She is on a proton pump  inhibitor, but has not had an upper endoscopy in 7 years.   PROCEDURE:  The patient was placed in the left lateral decubitus  position and placed on the pulse monitor with continuous low-flow oxygen  delivered by nasal cannula.  She was sedated with 60 mcg IV fentanyl and  5 mg IV Versed.  The Olympus video endoscope was advanced under direct  vision into the oropharynx and esophagus.  The esophagus was straight  and of normal caliber with the squamocolumnar line at 38 cm.  There was  a tiny sliding hiatal hernia.  There was no visible esophagitis or ring  stricture or other abnormality.  The stomach was entered, and small  amount of liquid secretions were suctioned from the fundus on  retroflexed view.  Cardia was unremarkable.  There was a erythema and  granularity throughout the stomach most intense proximally with a  paucity in the distal antrum.  There were no focal erosions or ulcers or  AVMs.  Biopsies and a CLO test were taken.  The pylorus was nondeformed  and easily allowed passage of the endoscope tip into the duodenum.  Both  the bulb and second portion were inspected and appeared to be within  normal limits.  The scope was then withdrawn, and the patient returned  to the recovery room in stable condition.  She tolerated the procedure  well, and there were no immediate complications.   IMPRESSION:  Gastritis, otherwise  normal study.   PLAN:  We will await biopsies and CLO test and continue current  antipeptic medication for now.           ______________________________  Elyse Jarvis. Amedeo Plenty, M.D.     JCH/MEDQ  D:  07/21/2008  T:  07/22/2008  Job:  [id number]   cc:   Jeneen Rinks L. Deterding, M.D.

## 2010-09-21 NOTE — Discharge Summary (Signed)
[name], [name] NO.:  [id number]   MEDICAL RECORD NO.:  [id number]          PATIENT TYPE:  INP   LOCATION:  2037                         FACILITY:  [place]   PHYSICIAN:  Sherryl Manges, M.D.  DATE OF BIRTH:  06/15/1940   DATE OF ADMISSION:  07/18/2008  DATE OF DISCHARGE:  07/22/2008                               DISCHARGE SUMMARY   PRIMARY MEDICAL DOCTOR/PRIMARY NEPHROLOGIST:  Dr. Jimmy Footman.   PRIMARY CARDIOLOGIST:  Tery Sanfilippo, M.D.   GASTROENTEROLOGIST:  Joyice Faster. Oletta Lamas, M.D.   DISCHARGE DIAGNOSES:  1. Gastritis, confirmed by esophagogastroduodenoscopy July 22, 2008.  2. Atypical chest pain, status post negative stress Myoview July 19, 2008.  3. Paroxysmal atrial flutter.  4. Systemic lupus erythematosus/lupus nephritis, status post renal      transplantation.  5. Chronic immunosuppressant treatment, secondary to #4 above.  6. Crohn`s disease.  7. Febrile illness, etiology unclear.   DISCHARGE MEDICATIONS:  1. Lopressor 25 mg p.o. b.i.d. (was on Toprol XL 100 mg p.o. daily).  2. Hectorol 2.5 mcg p.o. daily.  3. Prograf 5 mg p.o. b.i.d.  4. Myfortic 180 mg p.o. b.i.d.  5. Asacol 400 mg p.o. b.i.d.  6. Protonix 40 mg p.o. b.i.d.  7. Aspirin 81 mg p.o. daily.  8. Cardizem CD 120 mg p.o. daily.  9. Catapres-TTS-2, one patch to skin q. weekly.  10.Amiodarone 100 mg p.o. b.i.d.  11.MiraLax 17 g p.o. daily.  12.Prednisone 10 mg p.o. daily.  13.Darvocet N-100 one p.o. p.r.n q 8 hours for 1 week.   Note: Toprol XL and Procardia XL have all been discontinued.   PROCEDURES.:  1. Abdominal x-ray/chest x-ray dated July 18, 2008:  This showed      nonspecific nonobstructive bowel gas pattern, stable bibasilar      scarring, no active cardiopulmonary disease.  2. Abdominal/pelvic CT scan dated July 18, 2008:  This showed no      acute abdominal process identified.  Pelvic CT showed no acute      pelvic process.  3. Chest CT scan dated  July 18, 2008.  This showed no acute thoracic      process.  There are collaterals along the anterior chest wall      suggesting central obstruction.  4. Stress Myoview dated July 19, 2008:  This showed no reversible      ischemia or infarction, normal wall motion, left ventricular      ejection fraction greater than 75%.  5. Chest x-ray dated July 20, 2008:  This showed stable borderline      heart size, linear atelectasis in the lingula, no acute      cardiopulmonary disease otherwise, calcified pleural plaque on the      right consistent with asbestos-related pleural disease.  6. EGD done July 21, 2008 by Dr. Teena Irani:  This showed gastritis,      otherwise normal study.  Biopsies were done and CLO-test:  As of      the time of this dictation, pathology report was still pending.   CONSULTATIONS:  1. Dr. Ronald Lobo,  gastroenterologist.  2. Dr. Teena Irani, gastroenterologist.  3. Dr. Elisabeth Cara, [facility] Cardiologist.   ADMISSION HISTORY:  As in H and P notes of July 18, 2008, dictated by  Dr. Gean Birchwood.  However, in brief this is a 70 year old female,  with known history of hypertension, SLE/lupus nephritis status post  renal transplant, Crohn`s disease, paroxysmal atrial fibrillation,  previous history of colostomy for perforated viscus, presenting with  complaints of recurrent epigastric pain of approximately 3 months'  duration.  She was admitted for further evaluation, investigation and  management, principally for GI workup, but with a view to ruling out  ischemic heart disease.   CLINICAL COURSE:  1. Atypical chest pain:  The patient presents with epigastric/lower      substernal pain, intermittently over a period of 3 months.      Although GI cause was high on the list of differentials, it was      felt that the patient would benefit from workup to rule out      possible ischemic heart disease, particularly against background of       obesity and hypertension.  Cardiology consultation was called,      which was kindly provided by Dr. Elisabeth Cara, who arranged a stress      Myoview.  Cardiac enzymes were cycled and remained un-elevated.      The patient underwent stress Myoview on July 19, 2008.  For      details, refer to procedure list above, and this showed no evidence      of reversible ischemia.  She has been reassured accordingly.   1. Paroxysmal atrial flutter/fibrillation:  The patient has a known      history of paroxysmal atrial fibrillation, and was on Toprol XL pre-      admission.  On day #1 of hospitalization, she went into fast atrial      flutter which was brought under control with Cardizem.  She was      thereafter managed under cardiology supervision with a combination      of Lopressor, Cardizem CD and Amiodarone, and reverted to sinus      rhythm.  She has remained in sinus rhythm ever since.  As the      patient is now on Cardizem CD, which is also a calcium channel      blocker, Procardia XL has been discontinued, as has Toprol XL   1. Gastritis:  As mentioned above, the patient presented with      epigastric pain on and off for approximately 3 months.  GI      consultation was kindly provided by Dr. Ronald Lobo.  For      details of the consultation, refer to consultation notes of July 18, 2008.  Following cardiology clearance, she underwent upper GI      endoscopy on July 21, 2008.  This confirmed gastritis, but was      otherwise a normal study.  She was recommended twice-daily proton      pump inhibitor.  H. pylori serology was negative.  Biopsies were      done, results were still pending at the time of discharge.   1. History of lupus nephritis, status post renal transplantation:  The      patient's renal function remained stable on pre-admission      immunosuppressant medication, throughout the course of her      hospitalization.  She did experience a slight  bump in creatinine to       1.37 of July 21, 2008 while n.p.o. for her upper GI endoscopy.      This responded to gentle intravenous fluids, and we are pleased to      note that as of July 22, 2008 her BUN was 22 with creatinine of      1.28.  She is anticipated to continue to follow up with Dr.      Jimmy Footman, her primary nephrologist, following discharge.   1. Febrile illness:  The patient did spike a pyrexia of 101.3 on July 18, 2008.  The physical examination was unremarkable for source of      infection.  Chest x-ray showed no evidence of pneumonic      consolidation and she had no respiratory tract symptoms.      Urinalysis grew 4000 to 5000 colonies of multiple bacterial      morphotypes, suggestive of urinary contamination.  Repeat urine      cultures showed no growth.  Blood cultures remained persistently      negative and the patient's white cell count which on July 18, 2008      was 13.4, had by July 19, 2008, normalized at 8.6 and remained      normal thereafter.  Antibiotics were discontinued on July 20, 2008      with no relapse of pyrexia   DISPOSITION:  The patient was on July 22, 2008, asymptomatic, very keen  to be discharged, considered clinically stable for discharge, and was  therefore discharged accordingly.   DIET:  Heart-healthy.   ACTIVITY:  As tolerated.   FOLLOW-UP INSTRUCTIONS:  The patient is to continue to follow up with  her primary nephrologist, Dr. Jimmy Footman, per prior scheduled  appointment.  In addition, she is to follow up with Dr. Alferd Apa  Gastroenterologist, telephone number [phone], August 06, 2008 at 10:30  p.m.  She is to follow up with Dr. Elisabeth Cara, cardiologist, within 1-2  weeks.  She has been instructed to call for an appointment and has  verbalized understanding.      Sherryl Manges, M.D.  Electronically Signed     CO/MEDQ  D:  07/22/2008  T:  07/22/2008  Job:  [id number]   cc:   Tery Sanfilippo, MD  Joyice Faster. Rolla Flatten., M.D.  Dr.  Archie Balboa

## 2010-09-21 NOTE — Discharge Summary (Signed)
[name], [name]               ACCOUNT NO.:  [id number]   MEDICAL RECORD NO.:  [id number]          PATIENT TYPE:  INP   LOCATION:  6702                         FACILITY:  [facility]   PHYSICIAN:  Windy Kalata, M.D.DATE OF BIRTH:  01/09/1941   DATE OF ADMISSION:  03/19/2008  DATE OF DISCHARGE:  03/21/2008                               DISCHARGE SUMMARY   ADMITTING DIAGNOSES:  1. Acute onset abdominal pain with nausea and vomiting.  2. End-stage renal disease, status post cadaveric renal transplant in      2006.  Baseline creatinine upper 1's to low 2's.  3. History of atrial fibrillation.  4. Hypertension.  5. Anemia of chronic disease.  6. Right renal cell carcinoma, status post bilateral nephrectomies.  7. Secondary hyperparathyroidism, status post parathyroidectomy.  8. History of Crohn disease.   DISCHARGE DIAGNOSES:  1. Gastroenteritis, resolved.  2. End-stage renal disease, status post cadaveric renal transplant in      2006 with creatinine at baseline 1.89 at the time of discharge.  3. History of atrial fibrillation, in sinus rhythm.  4. Hypertension.  5. Anemia of chronic disease.  6. Secondary hyperparathyroidism, status post parathyroidectomy.  7. History of Crohn disease.   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  The patient has  had right lower quadrant pain, cramping in nature with nausea and  vomiting x3 as an outpatient.  There has been no change in her bowel  movements.  No urinary symptoms.  No anorexia, chest pain, or shortness  of breath.  She states that she has an abdominal hernia,  which appears  larger than usual.  Abdominal CAT scan done in the emergency room was  essentially negative.  She was admitted for pain control and evaluation.   LABS ON ADMISSION:  White count 10,300, hemoglobin 12.4, and platelets  184,000.  Sodium 137, potassium 4.1, chloride 98, CO2 of 26, glucose  108, BUN was 18, creatinine 1.4, calcium 10.0, albumin 4.7, and total  protein 8.2.  LFTs within normal limits.  Alk phos 262 and total  bilirubin 0.6.  Urinalysis was clear, yellow, and negative.  Amylase 169  and lipase 16.  Chest x-ray, cardiomegaly, pulmonary vascular  congestion.  No pleural effusion.  CAT scan of abdomen, increasing  retroperitoneal lymphadenopathy, specifically retrocrural.  No acute  intraabdominal findings, otherwise.  Right pelvic transplant kidney is  unremarkable and no acute pelvic findings.   HOSPITAL COURSE:  Upon admission, the patient was placed on her usual  medications except for Procardia and Lasix were held.  She received  Dilaudid for pain control.  She was given IV fluids for hydration and  placed on a clear liquid diet.  With supportive care, her symptoms  resolved  spontaneously.  No further workup was done.  Her diet was  advanced and she tolerated it well.  On the day of discharge, her  abdominal pain, nausea, and vomiting had completely resolved.  It is  presumed she had some an episode of gastroenteritis.  There is the  question of an abdominal hernia playing a role on all this, but clearly  did not have what appeared to be an acute abdomen on admission.  Her  appetite was not 100% yet, but it was improving.  She will be seen in  followup at our office on Thursday, March 27, 2008, at 10:45 a.m. for  followup labs.   Procardia and Lasix will not be continued at the time of discharge.  She  is instructed to continue holding them until further notice.  Blood  pressures at the time of discharge were approximately 126/74, heart rate  of 68, and  respirations were 20.  As mentioned earlier, her creatinine  was 1.89 at the time of discharge with a BUN of 28, potassium 4.2,  chloride 137, and CO2 of 27.  White blood cell count was within normal  limits at 8100 and hemoglobin 11.9.  She will follow up with Dr. Mauricia Area on Wednesday, April 02, 2008, at 1:45 p.m.   DISCHARGE MEDICATIONS:  1. Prograf 5 mg b.i.d.  2. Myfortic 180 mg 2 pills b.i.d.  3. Protonix 40 mg b.i.d.  4. Toprol-XL 100 mg daily.  5. Asacol 800 mg b.i.d.  6. Aspirin 81 mg daily.  7. Continue holding Lasix and Procardia until further notice.   Repeat outpatient labs on Thursday, March 27, 2008, at 10:45 a.m. at  [facility]' office.  Follow up with Dr. Mauricia Area  on Wednesday, April 02, 2008, at 1:45.      Nonah Mattes, P.A.    ______________________________  Windy Kalata, M.D.    RRK/MEDQ  D:  03/21/2008  T:  03/21/2008  Job:  [id number]

## 2010-09-21 NOTE — Consult Note (Signed)
NAME:  [name], [name]               ACCOUNT NO.:  [id number]   MEDICAL RECORD NO.:  [id number]          PATIENT TYPE:  INP   LOCATION:  6730                         FACILITY:  [facility]   PHYSICIAN:  Princess Bruins. Hickling, M.D.DATE OF BIRTH:  02-11-1941   DATE OF CONSULTATION:  09/02/2008  DATE OF DISCHARGE:                                 CONSULTATION   CHIEF COMPLAINT:  Right leg pain.   HISTORY OF THE PRESENT CONDITION:  The patient is a 70 year old with end-  stage renal disease secondary to systemic lupus erythematosus, who is on  dialysis, and has received a cadaveric kidney transplant, which she  tolerated well.  She has had recent problems with decreased glomerular  filtration.  She has been hospitalized to study that.  She also has been  hospitalized because she has a history of pain in her right leg that  dates back to 2008 that became much more severe in November 2009.  She  has been seen by a number of specialists other than Neurology (though  she was seen by Dr. Clearence Cheek in 2008).  She has had a number of  imaging studies of her back, which shows spondylosis, compression  fractures, and angioma of the vertebra, but did not show any change in  signal in the cord, did not show any compression of the cord, and did  not show any significant nerve root compression.  The patient has had  Doppler studies that do not show any signs of arterial or venous  insufficiency nor do her legs appear to be that way.   The patient describes her pain as paresthetic in her foot.  It tends to  spread up from leg to her hip.  For the most part, she has a dull aching  pain in her calf and to a lesser extent to her thigh on the right side.  Left side does not seem to have this degree of pain.  She does not have  significant pain with walking, sitting, or lying.  Indeed, she is  uncomfortable equally in all 3 settings.   She has gradually relied more and more on narcotic analgesics and found  the oral narcotics were not helping.  She is receiving IV Dilaudid and  is comfortable at the time of this examination.   PAST MEDICAL HISTORY:  Gastroesophageal reflux disease, systemic lupus  erythematosus, Turner disease, hypertension, and Crohn disease.   PAST SURGICAL HISTORY:  Kidney transplant in 2006, hemorrhoidectomy,  exploratory GI surgery, and dialysis grafts.   FAMILY HISTORY:  Positive for coronary artery disease in elderly with  kidney disease.   MEDICATIONS:  Protonix, Lopressor, Prograf, __________, Hectorol,  __________, aspirin, Cardizem, Optipress, amiodarone, MiraLax,  prednisone, Darvocet, Procardia, and Toprol.   DRUG ALLERGIES:  CIPRO, SEPTRA, PENICILLIN, AMOXICILLIN, AMPICILLIN, and  TETRACYCLINE.   SOCIAL HISTORY:  The patient does not smoke or drink alcohol.  She lives  independently.  She is followed jointly at [facility] and [facility].   PHYSICAL EXAMINATION:  GENERAL:  Today this is a pleasant woman in  no  acute distress.  VITAL SIGNS:  Temperature is 97.2, blood pressure 100/58, resting pulse  95, respiration is 20, oxygen saturation 97%.  EARS, NOSE AND THROAT:  No infection.  NECK:  Supple neck.  No bruits.  LUNGS:  Clear.  HEART:  No murmurs.  Pulses normal.  ABDOMEN:  Soft.  Bowel sounds normal.  No hepatosplenomegaly.  EXTREMITIES:  Normal.  No edema or wasting.  NEUROLOGIC:  Awake, alert, pleasant, in no acute distress.  CRANIAL NERVES:  Round reactive pupils.  Visual fields full to double  simultaneous stimuli.  Extraocular movements are full and conjugate.  Symmetric facial strength.  Midline tongue.  Air conduction greater than  bone conduction bilaterally.  Motor examination, normal strength in arms  and her legs.  There is no weakness.  There is no drift.  Fine motor  movements were normal.  Sensation shows a stocking neuropathy to midcalf  bilaterally.  She has intact  proprioception.  She had decreased  vibratory sensation to the ankles and she has decreased vibration in the  right foot comparison with the left foot.  She had good stereoagnosis  bilaterally.  Cerebellar, finger-to-nose rapid repetitive movements were  okay.  Gait was not tested.  Deep tendon reflexes were absent.  The  patient had bilateral flexor plantar responses.   IMPRESSION:  The patient has peripheral polyneuropathy.  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  She has had thorough imaging workups both at the [facility], [place], and allegedly  [facility] recently.  Unfortunately, we do not  have the [facility] records, despite my request for them.  They need to be  requested so that we do not duplicate anything.  I do not believe that  the patient has a mechanical problem in terms of herniated nucleus  pulposus with spinal stenosis or lumbar spondylosis.   I also need to review the [place] record that was done by Dr. Estella Husk in  2008.  I requested that, but did not receive that either.   The patient was told by Dr. Estella Husk in 2008 that she had a sciatic  neuropathy this is not the same of sciatica.  Sciatica refers to a  compressive neuropathy caused by a herniated disks, facet arthropathy,  or foraminal narrowing.  It was treated by decompression.  There is  nothing here to decompress.  This is either a vascular and/or  inflammatory process involving the nerve.  I cannot say for certain if  this is sciatic nerve, because the examination does not localize to it.   She needs a nerve conduction and EMG study in order to further  characterize this.  This could be done as an outpatient procedure at  [facility].  As far as treatment is concerned with ailing kidney, I need the  guidance of the [employer].  Neurontin would be the most  efficacious medication, but it is eliminated fairly by the kidneys and  to be re-adjusted for her glomerular filtration rate.  Lyrica, Cymbalta,  and Tegretol have also been helpful in this setting.  Lidoderm might be  another modality that could decrease her pain if placed in area that is  particularly noxious.   I appreciate the opportunity to see the patient.  The goal is to try to  decrease her overall pain and  hopefully then to decrease her  dependence  on narcotics.  I appreciate the opportunity to take her care.     Princess Bruins. Gaynell Face, M.D.  Electronically Signed    WHH/MEDQ  D:  09/02/2008  T:  09/03/2008  Job:  [id number]   cc:   Donato Heinz, M.D.

## 2010-09-21 NOTE — Op Note (Signed)
NAME:  BEVERELY, [name]               ACCOUNT NO.:  [id number]   MEDICAL RECORD NO.:  [id number]          PATIENT TYPE:  INP   LOCATION:  6736                         FACILITY:  [facility]   PHYSICIAN:  [name]. Rolla Flatten., M.D.DATE OF BIRTH:  12-09-1940   DATE OF PROCEDURE:  DATE OF DISCHARGE:                               OPERATIVE REPORT   PROCEDURE:  Partial colonoscopy.   MEDICATIONS:  1. Fentanyl 25 mcg.  2. Versed 3 mg IV.   INDICATION:  Nice woman who has had previous history of Crohn disease,  has had large hemorrhoids, had rectal Crohn's, has had previous surgery  in that area.  She has had renal transplant, has a large abdominal wall  hernia with CT showing some bowel in the hernia with chronic diffuse  episodic abdominal pain of unclear etiology.  Due to her history of  Crohn's, etc, we are performing a colonoscopy to see if she has active  Crohn's obstruction, etc.  We will also looking for signs of ischemia  due to the hernia.   DESCRIPTION OF PROCEDURE:  Procedure had been explained to the patient,  consent obtained.  In the left lateral decubitus position, the pediatric  colonoscope was inserted and advanced.  The patient does have some anal  stenosis and we were able to enter the rectum.  The rectum was grossly  normal as we went further proximally.  There was a large amount of solid  stool in the colon despite previous NuLytely prep.  We were able to get  past this and got up on what we felt was a transverse colon, it was  really unclear.  We had about 90-cm of scope down.  At this point, we  were watching the abdomen carefully in the light from the scope, again  to go into the hernia sac.  At this point, I did not want to proceed  further.  There was solid stool up until this point.  The scope was then  withdrawn.  The mucosa was nice and healthy.  There was no signs of  ischemia with no signs of active Crohn's disease.  The patient tolerated  the procedure well.   ASSESSMENT:  Grossly normal probably to the transverse colon with colon  up in the hernia sac with no signs of ischemia and no signs of active  Crohn disease.  Prep suboptimal.   PLAN:  We will start on MiraLax 2-3 times a day to try to keep her  stools soft.  If she continues to have pain and discomfort, we would  probably need to do a small bowel series.           ______________________________  Joyice Faster Rolla Flatten., M.D.     Jaynie Bream  D:  04/25/2008  T:  04/26/2008  Job:  [id number]   cc:   Jeneen Rinks L. Deterding, M.D.

## 2010-09-21 NOTE — H&P (Signed)
NAME:  [name], SORTER NO.:  [id number]   MEDICAL RECORD NO.:  [id number]          PATIENT TYPE:  EMS   LOCATION:  MAJO                         FACILITY:  [facility]   PHYSICIAN:  [name], MDDATE OF BIRTH:  07-Sep-1940   DATE OF ADMISSION:  07/18/2008  DATE OF DISCHARGE:                              HISTORY & PHYSICAL   PRIMARY CARE PHYSICIAN:  Dr. Jimmy Footman of nephrology   CHIEF COMPLAINT:  Epigastric pain.   HISTORY OF PRESENTING ILLNESS:  A 70 year old female with history of  lupus nephritis status post renal transplant, hypertension, PAF, Crohn  disease, has presented to the ER with complaints of epigastric pain.  Patient has been having similar complaints over the last 3 months which  comes off and on.  It is located in the epigastric area, sometimes  retrosternal, nonradiating, constant, associated with some nausea and  sometimes vomiting.  Denies any blood in the vomitus.  Denies any  diarrhea and denies any palpitations, shortness of breath, dizziness,  loss of consciousness, diaphoresis.   Patient was already evaluated by the gastroenterology services and has  been recommending that they will do EGD and colonoscopy this stay once  cardiological causes have been ruled out.   PAST MEDICAL HISTORY:  1. Lupus nephritis.  2. Hypertension.  3. Renal transplant for lupus nephritis.  4. Crohn disease.  5. Paroxysmal atrial fibrillation.   PAST SURGICAL HISTORY:  1. Renal transplant.  2. Colostomy for perforated viscous.  3. C sections.   MEDICATION PRIOR TO ADMISSION:  1. Toprol XL 100 mg p.o. daily.  2. Hectorol 2.5 mcg p.o. daily.  3. Prograf 5 mg p.o. b.i.d.  4. Myfortic 180 mg p.o. b.i.d.  5. Asacol 400 mg p.o. b.i.d.  6. Protonix 40 mg p.o. b.i.d.  7. Aspirin 81 mg p.o. daily.  8. Procardia XL 60 mg p.o. daily.  9. MiraLax 17 g p.o. daily.  10.Prednisone 10 mg p.o. daily.   ALLERGIES:  1. PENICILLIN.  2. TETRACYCLINE.  3.  SEPTRA.  4. ERYTHROMYCIN.  5. CIPROFLOXACIN.   FAMILY HISTORY:  Nothing contributory.   SOCIAL HISTORY:  Patient lives with her husband.  Denies smoking  cigarettes, drinking alcohol, or using illegal drugs.   REVIEW OF SYSTEMS:  As per in history of presenting illness, nothing  else significant.   PHYSICAL EXAMINATION:  Patient examined at bedside.  Still has some  epigastric pain which is relieved by Dilaudid.  VITAL SIGNS:  Blood pressure 149/73.  Pulse 85 per minute.  Temperature  97.2.  Respiration 18 per minute.  O2 saturation 97%.  HEENT:  Anicteric.  No pallor.  CHEST:  Bilateral air entry present.  No rhonchi.  No crepitation.  HEART:  S1 and S2 heard.  ABDOMEN:  Soft, nontender.  Bowel sounds heard.  There is a ventral  hernia on the right side.  No guarding.  No rigidity.  CNS:  Alert, awake, oriented to time, place, and person.  Moves upper  and lower extremities 5/5.  EXTREMITIES:  Peripheral pulses felt.  No edema.   LABS:  There is acute abdominal  series done on July 16, 2008, which  shows stable chest x-ray with basilar scarring, no active process, no  obstruction or free air.  EKG, normal sinus rhythm with a right bundle  branch block.  CBC, WBC is 13.4, hemoglobin 12.3, hematocrit 36.5,  platelets 140, neutrophils 79%.  Complete metabolic panel, sodium 381,  potassium 3.8, chloride 101, carbon dioxide 29, glucose 97, BUN 24,  creatinine 1.5, total bilirubin 0.7, alkaline phosphate 106, AST 18, ALT  16, calcium 9.6, lipase 19.  UA is negative for nitrites, moderate  leukocytes, WBC 3 to 6, bacteria none.   ASSESSMENT:  1. Recurrent epigastric pain.  At this time, we will rule out ACS.  2. Lupus nephritis with renal transplant.  3. Hypertension.  4. History of paroxysmal atrial fibrillation.  5. History of Crohn disease.   PLAN:  We will admit patient to telemetry.  We will cycle cardiac  markers.  We will resume her home medications.  Patient does not  have  any complaints of dysuria but we will have urine cultures done.  Gastroenterology has already  evaluated the patient and is planning to do an EGD and colonoscopy once  cardiac reasons have been cleared.  We have already called [facility] for cardiology consult.  We will follow their  recommendations.  Further recommendation as condition evolves.      [name], MD  Electronically Signed     ANK/MEDQ  D:  07/18/2008  T:  07/18/2008  Job:  [id number]

## 2010-09-21 NOTE — H&P (Signed)
NAME:  [name], [name]               ACCOUNT NO.:  [id number]   MEDICAL RECORD NO.:  [id number]          PATIENT TYPE:  INP   LOCATION:  6730                         FACILITY:  [facility]   PHYSICIAN:  Donato Heinz, M.D.DATE OF BIRTH:  27-Apr-1941   DATE OF ADMISSION:  09/02/2008  DATE OF DISCHARGE:                              HISTORY & PHYSICAL   HISTORY OF PRESENT ILLNESS:  [name] is a 70 year old African  American female with history of end-stage renal disease secondary to  lupus nephritis, who had a renal transplant in 2006.  The patient was  just seen by Dr. Jimmy Footman, her primary nephrologist, yesterday for  routine visit.  At that time, her urinalysis showed 25-32 white cells.  She was treated empirically with Keflex due to her multiple drug  allergies.  Other office labs drawn yesterday just came back today.  Her  creatinine was found to be 1.9 up from 1.2 in March at [facility] and 1.17 in  February 2010.  Other labs yesterday showed a white count of 11.2,  hemoglobin 11.4, and platelets 104,000.  Iron studies and intact PTH  were normal.  Her Tacrolimus level was elevated at 16.25 and of note was  that since March, she had been started on Cardizem CD for hospital  admission that included an episode of paroxysmal atrial fibrillation.  The patient had no complaints of leg pain yesterday because she said I  thought I could handle it.  Leg pain, however, worsened and she came to  the [facility] Emergency Department for this persistent, but intermittent  right lower leg and lower back pain.  She carries a diagnosis of  peripheral neuropathy and sciatic-like nerve pain.  She had just been  either admitted or received treatment in the emergency room of [facility] last week for similar symptoms.  The patient  describes her pain that starts in her right lower back close to her SI  area and radiates around her hip and down her leg.  She says her right  foot  particularly the toes feel cold.  She says her toes are numb.  She  says her foot hurts when you tap on the ball of her foot.  She gets  shooting pains.  She does not have any particular tingling or pin-like  sensation at rest.  She says her other associated symptoms are nausea  and vomiting, but she does not have any fever, chills, shortness of  breath, chest pain, cough, dysuria, or urgency.  She was evaluated by  the ER physician.  Workup in the emergency room included a lumbar CT,  which showed no acute fracture, subluxation, mild disk flattening L4-5  level.  Labs drawn in the emergency room today showed white count of  15,900, 72% neutrophils, 22% lymphocytes, and 4% monocytes.  Hemoglobin  13.2 and platelets clumped noted on smear unable to estimate.  Urinalysis showed 11-12 white cells, 0-2 red cells, rare bacteria,  positive nitrites, moderate leukocytes, and trace blood negative for  protein.  BMP showed sodium 133, potassium 4.3 with slight homolysis,  chloride 97, CO2  of 25, glucose 105, BUN 21, creatinine 1.76, and  calcium 9.6.   PAST MEDICAL HISTORY:  1. As above plus hypertension.  2. Crohn disease, currently off Asacol.  3. Renal cell carcinoma, status post nephrectomy with postprocedure      infection and hematoma.  4. Jehovah's Witness.  5. Secondary hyperparathyroidism.  6. Bell palsy.  7. Kidney stones.  8. History of DVT.  9. Multiple avascular access.  10.Issues with the left thigh graft still patent.  11.Colostomy, status post perforated viscus that was subsequently      reversed.  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  13.Constipation.  14.Ventral hernia.  15.History of TIAs.  16.History of SVT.  17.Anxiety.   CURRENT MEDICATIONS:  1. Prednisone 10 mg per day.  2. Prograf 1 mg b.i.d.  3. Myfortic 360 mg b.i.d.  4. Protonix 40 mg b.i.d.  5. Lopressor 25 mg b.i.d.  6. MiraLax daily.  7. Hectorol 2.5 mcg daily just changed to 0.5.  8. Calcitriol 2 tablets  daily.  9. Aspirin 81 mg per day, recently changed to 325 mg daily.  10.Hydrocodone 5/500 two per day, sometimes more.  11.Benadryl p.r.n.  12.Cardizem CD 120 mg, discontinued yesterday.  13.Clonidine 0.2 mg b.i.d., discontinued yesterday.  14.Amiodarone 100 mg b.i.d.  15.Procardia XL 60 mg per day.   ALLERGIES:  ERYTHROMYCIN causes swelling.  TRIMETHOPRIM AND  SULFAMETHOXAZOLE causes hives.  TETRACYCLINE causes swelling.  AMOXICILLIN causes swelling.  CIPROFLOXACIN causes profound her  neutropenia.   SOCIAL HISTORY:  The patient lives with husband, who is retired.  She  has adult children and grandchildren.  She does not use tobacco or  alcohol.  She is a Restaurant manager, fast food.   FAMILY HISTORY:  Noncontributory.   PHYSICAL EXAMINATION:  VITAL SIGNS:  Afebrile, blood pressure 185/95  upon arrival to the emergency room today, which steadily declines 133/66  to 103/62 to 96/60, heart rate 71, respirations 18, and O2 sats 100% on  room air.  GENERAL:  Well-developed, well-nourished female, reclining on her  stretcher, in no acute distress.  HEENT:  Normocephalic and atraumatic.  PERRLA.  Oropharynx, dentition in  good repair.  No exudates, slight erythema on tongue and oropharynx.  NECK:  Supple without JVD.  HEART:  Regular rate and rhythm with 2/6 murmur.  LUNGS:  Clear to auscultation bilaterally.  BACK:  No CVA tenderness.  No edema.  Does have palpable right lateral  LS/SI area tenderness to palpation.  ABDOMEN:  Soft.  Positive bowel sounds.  Large ventral hernia.  Right  lower quadrant allograft nontender.  EXTREMITIES:  Left thigh graft positive thrill and bruit.  No lower  extremity edema, 2+ dorsalis pedis pulses bilaterally.  Feet warm,  excellent condition.  NEUROLOGIC:  Alert and oriented x3.  Moves all extremities well.  She  did not respond with much pain or tenderness during my palpation of her  foot, tapping on the right ball of her foot, which she was aware of  when  I did seem to elicit some shooting-type pain.   ASSESSMENT AND PLAN:  1. Renal transplant with end-stage renal disease secondary to lupus.      The patient currently has acute renal failure with elevated      creatinine, consulted with Dr. Jimmy Footman today, who felt that this      was most likely due to Prograf toxicity having been started on      Cardizem last month.  We will discontinue Cardizem, which she  actually did in the office yesterday, adequately hydrate, hold      Prograf and monitor Prograf levels.  2. Urinary tract infection.  Treat with IV Ancef secondary to multiple      antibiotic toxicities.  Her last UTI was in January for Proteus      mirabilis for which she was treated with Keflex.  3. Hypertension.  Blood pressure was elevated on presentation to the      emergency room, but she had not had her medications.  She      subsequently received significant pain medications, which most      likely explains her drop in blood pressure.  She will be receiving      some IV fluids, which will bring her blood pressure up.  4. Secondary hyperparathyroidism, status post parathyroidectomy.  She      is on low-dose calcitriol.  5. Lupus.  Continue prednisone 10 mg per day.  6. Right lower extremity neuropathic pain.  The patient has sciatica-      type syndrome.  Lumbar CT shows L4-5 disk flattening.  We will      consult Neurology while she was here.  I am adding Flexeril as      well.  7. Crohn disease, not active.  Her Asacol was discontinued by Dr.      Jimmy Footman.  8. Paroxysmal atrial fibrillation noted during March admission.  Her      medications have been changed as previously mentioned.  We will      place her on telemetry and watch for any PAF.  She will continue on      Lopressor and also amiodarone.  I am going to check a TSH level as      well.  9. Anxiety.  This is somewhat of a component and all of these.  She is      not on in any particular medicines,  although she has been on Ativan      in the past.      Alric Seton, P.A.    ______________________________  Donato Heinz, M.D.    MB/MEDQ  D:  09/02/2008  T:  09/03/2008  Job:  [id number]   cc:   Ronald Lobo, M.D.  Tery Sanfilippo, MD  [place] Deterding, M.D.

## 2010-09-24 NOTE — Discharge Summary (Signed)
   NAME:  [name], [name]                        ACCOUNT NO.:  [id number]   MEDICAL RECORD NO.:  [id number]                   PATIENT TYPE:  INP   LOCATION:  5528                                 FACILITY:  [facility]   PHYSICIAN:  Loura Back. Lavella Hammock, M.D.               DATE OF BIRTH:  Mar 27, 1941   DATE OF ADMISSION:  08/01/2002  DATE OF DISCHARGE:  08/15/2002                                 DISCHARGE SUMMARY   DIAGNOSIS:  Peritoneal abscess, status post left nephrectomy on July 18, 2002.   HISTORY OF PRESENT ILLNESS:  This 70 year old black female was admitted  complaining of abdominal pain, nausea, vomiting, anorexia, and fever x 2  days.  She has a history of end-stage renal disease secondary to __________  and receives dialysis on Monday, Wednesday, and Friday at [facility].  She  is a patient of Caren Griffins B. Lorrene Reid, M.D., with [facility].   Dictation Ended At [employer].     Chipper Herb, M.D.                        Loura Back Lavella Hammock, M.D.    KB/MEDQ  D:  08/14/2002  T:  08/14/2002  Job:  [id number]

## 2010-09-24 NOTE — Op Note (Signed)
   NAME:  [name], [name]                        ACCOUNT NO.:  [id number]   MEDICAL RECORD NO.:  [id number]                   PATIENT TYPE:  OIB   LOCATION:  2874                                 FACILITY:  [facility]   PHYSICIAN:  Judeth Cornfield. Scot Dock, M.D.        DATE OF BIRTH:  [birth year]   DATE OF PROCEDURE:  12/03/2002  DATE OF DISCHARGE:  12/03/2002                                 OPERATIVE REPORT   PREOPERATIVE DIAGNOSIS:  Pseudoaneurysm of left thigh arteriovenous graft.   POSTOPERATIVE DIAGNOSIS:  Pseudoaneurysm of left thigh arteriovenous graft.   PROCEDURE:  Revision of left thigh arteriovenous graft with interposition  segment of 7 mm PTFE graft.   SURGEON:  Judeth Cornfield. Scot Dock, M.D.   ANESTHESIA:  Local with sedation.   SURGICAL TECHNIQUE:  The patient was taken to the operating room, sedated by  anesthesia.  The left thigh was prepped and draped in the usual sterile  fashion.  After the skin was anesthetized with 1% lidocaine, an incision was  made over the graft proximal and distal to the pseudoaneurysm.  A new tunnel  was created around the pseudoaneurysm between the two incisions after the  skin was anesthetized.  A 7 mm graft was tunneled between the two incisions.  The patient was then heparinized.  The graft was clamped proximally and  distally and divided at both ends.  The graft was sewn at both ends using  continuous 6-0 Prolene suture.  A short segment of the old graft was excised  at both ends.  The heparin was reversed with protamine.  The wound was  closed with a deep layer of 3-0 Vicryl and the skin was closed with 4-0  Vicryl after hemostasis was obtained.  The patient tolerated the procedure  well and was transferred to the recovery room in satisfactory condition.  All needle and sponge counts were correct.                                               Judeth Cornfield. Scot Dock, M.D.    CSD/MEDQ  D:  12/03/2002  T:  12/03/2002  Job:  [id number]

## 2010-09-24 NOTE — Op Note (Signed)
[place]. [facility]  Patient:    [name], [name]                       MRN: [id number] Proc. Date: 11/25/99 Adm. Date:  [id number] Disc. Date: [id number] Attending:  Hortencia Pilar CC:         [name]. [name], M.D.                           Operative Report  PREOPERATIVE DIAGNOSIS: Bilateral renal stones.  POSTOPERATIVE DIAGNOSIS: Bilateral renal stones.  OPERATION/PROCEDURE: Bilateral ureteroscopy and laser tripsy.  ANESTHESIA: General.  SURGEON: Marshall Cork. Jeffie Pollock, M.D.  DRAINS:  One right and two left 6 French x 24 cm double J stents.  COMPLICATIONS:  None.  INDICATIONS:  The patient is a 70 year old black female with end-stage renal disease, who has bilateral urolithiasis. She had previously undergone ureteroscopy with laser tripsy of a left lower pole system stone that was complicated by a ureteral tear. She returns now for completion of the procedure.  DESCRIPTION OF PROCEDURE: The patient was given 1 g of Ancef and taken to the operating room where general anesthesia was induced. She was placed in the lithotomy position. Her perineum and genitalia were prepped with Betadine solution. She was draped in the usual sterile fashion. Cystoscopy was performed using the 22 [name] scope and 12 degree lens. The right ureteral stent was grasped with the grasping forceps and pulled through the ureteral meatus and wire was passed through it to the kidney. The stent was removed. The balloon dilation catheter was then used to dilate the ureteral orifice and a 6 French flexible ureteroscope was then passed through the kidney without difficulty. A large stone was identified in an upper pole calyx with a narrowed infundibulum. The stone was engaged with the Berwind and then treated until adequately fragmented. The stone was somewhat embedded in the wall of the calyx, but fragmentation appeared to be good. There were an additional two stones in the mid  pole calyx which were fragmented as well. The upper pole stone appeared to have some calcium content, but the remainder of the stones appeared to be pure uric acid. After adequate fragmentation of the right renal stones, a wire was passed through the ureteroscope. The ureteroscope was removed and a cystoscope was reinserted over the wire. A 6 French 24 cm double J stent was passed over the wire under fluoroscopic guidance. Once the stent was in position, the wire was removed with good coil in the kidney and good coil in the bladder. The grasping forceps were then used to grasp the stent to the upper pole of the left kidney. This was pulled through the urethral meatus. The wire was passed through the stent. The stent was removed and the 6 French flexible scope was passed over the wire to the kidney. Pieces of stones were noted in the upper pole. One was small, measuring approximately 3 mm and the other was a bit larger at 5-6 mm. These stones were engaged with the laser and then broken into small fragments as well. The ureteroscope was then removed after replacing the wire. Inspection on the way down revealed no additional stones. A cystoscope was replaced over the wire and a 6 [name] 24 cm double J was passed through this portio of the kidney was well. I then removed the stent to a lower pole of the left kidney in a  similar fashion, rewiring it and then passing the scope to the kidney. The left ureteral orifice had been dilated as well. Once the scope was in the kidney, inspection revealed several fragments in the upper pole surrounded by some purulent material. These fragments were reengaged with the laser and reduced in size further. No additional stones were identified. The ureteroscope was then removed over the wire with inspection along departure. No additional stones were noted in the ureter. The cystoscope was placed over the wire and a third 6 French x 24 mm double J stent was  inserted without difficulty to this portion of the kidney. The wire was removed leaving good coil in the kidney, good coil in the bladder. At this point, the bladder was drained. The cystoscope was removed. The patient was taken down from the lithotomy position and her anesthetic was reversed. She was moved to the recovery room in stable condition. There were no complications. DD:  11/25/99 TD:  11/26/99 Job: [id number]

## 2010-09-24 NOTE — Discharge Summary (Signed)
[place]. [facility]  Patient:    [name], [name]                     MRN: [id number] Adm. Date:  [id number] Disc. Date: [id number] Attending:  Sherrin Daisy CC:         Joyice Faster. Oletta Lamas, M.D.   Discharge Summary  DISCHARGE DIAGNOSES: 1. Bleeding internal and external hemorrhoids. 2. Chronic renal failure, on hemodialysis. 3. History of ulcerative proctitis.  HISTORY AND HOSPITAL COURSE:  [name] is a 70 year old black female, Jehovah Witness, who has been on hemodialysis for many years, and has had multiple previous abdominal surgeries for bleeding ulcer, perforated diverticulitis, colostomy, colostomy take-down, and recently has been having problems with intermittent bleeding from her rectum.  She has had a diagnosis of ulcerative proctitis, and has been treated by Dr. Leonie Douglas, and she has also had significant problems with bleeding hemorrhoids, and has had rubberbanding and also injections for this.  Over the past month or two has intermittently bled enough to consider having a formal internal and external hemorrhoidectomy, but was admitted on December 19, 1999, with rectal bleeding.  She was seen by Dr. Oletta Lamas who did a sigmoidoscopy with the findings that this was not bleeding from the actual rectum or distal remaining colon, but was from her hemorrhoids.  She was admitted to the renal service. Her hematocrit was really not low.  Hematocrit of 40.  It had been extremely low in the past, and I was asked to see the patient a day or two after her admission.  On discussion with her I recommended we proceed on with the formal internal external hemorrhoidectomy and she was in agreement.  She was taken to surgery on December 21, 1999, under general anesthesia in lithotomy position, and a formal internal external hemorrhoidectomy was done in two quadrants. Her distal rectal and hemorrhoidal mucosa course had previous injections, and whether this  was irritation from the injections or variation of the proctitis I could not tell grossly, but on pathology exam it is thought to be acute and chronic inflammation.  No granulomas or dysplasia identified, so it is most likely just significant hemorrhoidal type irritation and bleeding. Postoperatively, she really had no further problems with acute bleeding, was watched for a couple of days and treated with pain measures.  I did have her on antibiotics for the perioperative period.  She continued on hemodialysis schedule on low heparin on those scheduled days, and was ready for discharge in improved condition on December 24, 1999.  I will plan on seeing her in the office in approximately a week, and she has a Cortifoam if needed plus her usual chronic medications.  Her incisions appear to be healing without evidence of an acute inflammation or infection at the time of discharge. DD:  01/19/00 TD:  01/21/00 Job: [id number]

## 2010-09-24 NOTE — Discharge Summary (Signed)
NAME:  [name], [name]                        ACCOUNT NO.:  [id number]   MEDICAL RECORD NO.:  [id number]                   PATIENT TYPE:  INP   LOCATION:  5019                                 FACILITY:  [facility]   PHYSICIAN:  Sol Blazing, M.D.             DATE OF BIRTH:  04/15/1941   DATE OF ADMISSION:  05/31/2003  DATE OF DISCHARGE:  06/03/2003                                 DISCHARGE SUMMARY   ADMITTING DIAGNOSES:  1. Fever.  2. Chronic pancreatitis.  3. Crohn's disease.  4. Renal cell carcinoma history, status post left nephrectomy March 2004 and     right nephrectomy November 2004.  5. End-stage renal disease.  6. Systemic lupus erythematosus.  7. Hypertension.  8. Anemia requiring iron.  9. Secondary hyperparathyroidism.   DISCHARGE DIAGNOSES:  1. Left thigh hematoma secondary to AV graft infiltration.  2. Chronic pancreatitis.  3. Crohn's disease.  4. Renal cell carcinoma history, status post left nephrectomy 07/2002 and     right nephrectomy 03/2003.  5. End-stage renal disease.  6. Systemic lupus erythematosus.  7. Hypertension.  8. Anemia requiring iron.  9. Secondary hyperparathyroidism.   CONSULTS:  None.   PROCEDURE:  1. Chest x-ray on March 31, 2003, did show fever and hypertension, showed     slight increase in mild peribronchial thickening, otherwise unchanged     chest compared to March 18, 2003.  2. CT of the abdomen per Dr. Corliss Parish, this was negative.  3. Hemodialysis x1.   BRIEF HISTORY:  Mrs. [name] is a 70 year old black woman with a  history of lupus, Crohn's and end-stage renal disease presented to the  emergency room with a less than 24 hour history of fever, chills and  myalgias. She was admitted to rule out sepsis secondary to possible AV graft  infected hematoma versus Crohn's disease versus recent nephrectomy.   For presenting physical exam, labs, and diagnostics, please refer to  admission H&P.   HOSPITAL  COURSE:  Problem 1.  Fever.  Mrs. [name] was empirically started on  Vancomycin per pharmacy and Tressie Ellis 2 grams IV q.48h.  However, blood  cultures were found to be negative. Chest x-ray was negative for acute  disease; however, AV graft in thigh appeared to have a hematoma.  This thigh  graft was cannulated without incident for dialysis on June 02, 2003.  CBC  upon presentation - white count 16.6 and hemoglobin 14, hematocrit 43.3,  platelets 127. CMP - sodium 135, potassium 3.3, chloride 93, CO2 27, glucose  161, BUN 47, creatinine 8.8, bilirubin total 0.6, alk phos 297, SGOT 21,  SGPT less than 8, total protein 6.7, albumin 3.1, and calcium 8.7 and again  blood cultures have been negative to date. Those were drawn on admission on  May 31, 2003.  CBC was checked again on June 02, 2003.  White count  decreased to 14.9.  She had one temperature  spike during this  hospitalization on June 01, 2003, between the hours of 0400 and 0800  hours of 101.4, but on June 02, 2003 and June 03, 2003, she remained  afebrile. She complained of nausea since admission.  CT of the abdomen was  ordered and per Dr. Moshe Cipro this was negative.  Vancomycin was  continued. She received a loading dose on the day of admission of 1.25 mg IV  and then a maintenance dose of vancomycin one gram that was given. It was  questionable whether she got a maintenance dose, as I am looking in the  orders.  This maintenance vancomycin dose of one gram was suppose to be  given in dialysis, it was not given; however, in dialysis. Fortaz 2 grams IV  to be given q.48h. This was suppose to be given q.48h at 6 p.m.  By hospital  day number three the patient was feeling much better. On the day of  discharge, the patient continued to complain of nausea and this has been  relieved with prn Phenergan.  Problem 2.  Hypertension.  She has been given Altace, Procardia and Toprol  XL outpatient dose.  Blood pressures  have been moderately to poorly  controlled.  In looking at her treatment sheet, in the outpatient center, in  the treatment sheet, in the dialysis unit here in the hospital, it appears  that her dry weight could be challenged.  Problem 3.  Crohn's disease and complaints of diarrhea. We maintained on  outpatient dose of Asacol and given Imodium as needed and Cortifoam prn.  Problem 4.  Chronic pancreatitis. Provided an outpatient dose of Pancreas  one tablet with each meal and at bedtime.  Problem 5.  Lupus.  Maintain on her outpatient dose of prednisone 10 mg p.o.  every day without incident.  Problem 6.  End-stage renal disease.  Used left thigh AV graft without  incident.  Problem 7.  Anemia. Hemoglobin upon presentation again was 14.  Hemoglobin  on June 02, 2003, was 13 with platelet count of 167.  She was given her  outpatient dose of Epogen. She was not given her outpatient dose of Epogen  7500 units. She was not due her NSAID 100 mg IV that was given q. Friday.  Problem 8.  Secondary hyperparathyroidism.  She was not given her outpatient  dose of Hectorol 4 mcg IV q hemodialysis treatment.   DISCHARGE MEDICINES:  1. Nephro-Vite one tablet p.o. every day.  2. Vitamin B 650 mg three times a day.  3. Procardia XL 60 mg p.o. b.i.d.  Do not take dose prior to dialysis.  4. Altace 2.5 mg p.o. q.h.s.  5. Toprol XL 100 mg p.o. q.h.s.  6. PhosLo 667 mg three tabs with meals.  7. Asacol 400 mg three tabs three times a day.  8. Pancreas one tablet with meals and at bedtime.  9. Protonix 40 mg p.o. every day.  10.      Prednisone 10 mg p.o. every day.  11.      Quinine 320 mg prior to hemodialysis.  12.      Patient requested prescription for MiraLax 17 grams in four to     eight ounces of water once a day as needed for constipation.  13.      Tylenol 325 mg to 650 mg every 4-6 hours as needed for pain.   ACTIVITY LEVEL:  As tolerated.  DIET:  Low fat renal 80/2/2 diet, 1200 cc  with  fluid restriction.   WOUND CARE:  Not applicable other than if left thigh AV graft becomes more  tender than usual, begins bleeding or develops any new changes, she  understands to call Dialysis Unit.   FOLLOW UP:  The patient understands to go to dialysis on Wednesday, Friday  and Monday.   INSTRUCTIONS FOR CHARGE NURSE:  1. Discontinue Ancef.  2. Epogen, Hectorol and InFeD no change doses. Dry weight will be decreased     to 56 kilograms on Wednesday and 55.5 kilograms on Friday. Will ask     rounding M.D./P. A. to discuss estimated dry weight and blood pressure     with patient.  We will continue Vancomycin one gram IV q. treatment x3     due to hematoma. I am requesting that the charge nurse take Folate,     Xanax, Megace and Questran off the med list as the patient does not take     this anymore.      Delray Alt, PA                   Sol Blazing, M.D.    MJG/MEDQ  D:  06/03/2003  T:  06/04/2003  Job:  [id number]   cc:   [facility]   [facility]   Dr. Jake Church Transplant Coordinator

## 2010-09-24 NOTE — Discharge Summary (Signed)
[name]. [facility]  Patient:    [name]                     MRN: [id number] Adm. Date:  [id number] Disc. Date: [id number] Attending:  Manuella Ghazi Dictator:   Myriam Jacobson, P.A.C. CC:         Cleotis Nipper, M.D.  [place]   Discharge Summary  DISCHARGE DIAGNOSES: 1. Enterobacter sepsis. 2. Ulcerative colitis. 3. End-stage renal disease on chronic hemodialysis. 4. Systemic lupus erythematosus. 5. Jehovahs Witness. 6. Anemia. 7. Secondary hyperparathyroidism, status post parathyroidectomy. 8. Elevated liver function tests. 9. Sinus drainage.  PROCEDURES: 1. Hemodialysis. 2. Rectal biopsy on Sep 14, 2000, by Dr. Cristina Gong, consistent with ulcerative    colitis. 3. 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  Left common femoral injection showed a left common    femoral vein, external iliac vein, common iliac vein and inferior vena cava    are widely patent.  Therefore, the patient has normal left side.  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  Left axillary vein shows chronic recanalization.  Right internal    jugular Ash catheter was removed by Dr. Della Goo. 4. Placement of right internal jugular 24 cm Ash catheter by Dr. Evangeline Dakin. 5. 2D echocardiogram on Sep 12, 2000, showing overall left ventricular systolic    function vigorous with ejection fraction 65-75%.  Aortic valve thickness    was moderate to mildly increased consistent with mild stenosis, mild mitral    valvular regurgitation and left atrium mildly dilated by Dr. Pauline Aus.  HISTORY OF PRESENT ILLNESS:  [name] is a 70 year old, African-American female with end-stage renal disease secondary to systemic lupus erythematosus. She has been on dialysis for a  long period of time.  She has a history of multiple medical problems including end-stage renal disease secondary to hyperparathyroidism which is recurrent, status post parathyroidectomy in January 2002, history of deep venous thrombosis, history of anemia and the patient is also a Neurosurgeon Witness, history of ulcerative colitis, history of hemorrhoids, history of hypertension, history of chronic pancreatitis with malabsorption, history of cholelithiasis, history of diverticulosis and bleeding, history of cholecystitis.  She went to [place], [place], to visit for 21 days on April 21, and apparently developed Bells Palsy on arriving. At that time she was given 10 days of 80 mg of prednisone and Famvir.  On May 2, she started having fevers and chills and worse on dialysis on May 3.  She returned home on May 4, and was chilling all day Saturday and Sunday.  Today on dialysis, she apparently did not have a fever, but was chilling and was given antibiotics after cultures were done.  She was treated with vancomycin and tobramycin.  She has a history of multiple allergies/intolerances.  At home, her temperature was 103.  She felt miserable and she was asked to come to the emergency room.  She had come in complaining of just being miserable and aching without energy.  She had to get in a wheelchair today because of weakness and was unable to walk.  She had a cough for five days with grayish phlegm, sore throat, but no earache, nausea, vomiting, diarrhea or arthralgias.  She has no new medications  other than what she was on before and the recent prednisone and Famvir.  The remainder of physical exam is outlined in the admission History and Physical.  LABORATORY DATA AND X-RAY FINDINGS:  Sodium 138, potassium 3.1, chloride 100, bicarb 25, glucose 99, BUN 49, creatinine 8.4, calcium 6.6.  Total protein 6.2, albumin 2.9, SGOT elevated at 320, SGPT elevated at 251, Alk phos 482, bilirubin 1.  White  count 17,600 with 96% neutrophils.  HOSPITAL COURSE:  #1 - SEPSIS:  The patient was continued on vancomycin and tobramycin.  Her dialysis catheter was removed by Dr. Jackolyn Confer on May 9.  She was restudied extensively to figure out possible placement of new access after she had a period of being afebrile and adequately treated with antibiotics while her organism was identified.  It appeared that the Enterobacter was sensitive to tobramycin, therefore, vancomycin was discontinued.  A new right IJ catheter was placed by Dr. Purcell Nails on May 13.  The patient will finish up her course of antibiotics by transitioning to Cipro for a short period of time.  It is our experience in previous sepsis that she dropped her hemoglobin significantly in response to Cipro, however, Dr. Mercy Moore felt for a short period of time this should not be a problem and would be a more appropriate choice for antibiotic treatment than tobramycin.  She will need to follow up with CVTS to get a new graft once her infection has resolved and her hemoglobin has increased.  #2 - ULCERATIVE COLITIS:  The patient had significant GI symptoms during this admission.  C. difficile was done and negative.  She was seen in consultation by Dr. Cristina Gong who did a rectal biopsy which showed findings consistent with ulcerative colitis.  No pseudomembranous colitis was seen on exam.  She was treated symptomatically with her Rowasa suppositories, Cortifoam and Asacol. Her symptoms gradually improved and were better near the time of discharge.  #3 - END-STAGE RENAL DISEASE:  The patient continued on dialysis without significant problems.  #4 - SYSTEMIC LUPUS ERYTHEMATOSUS:  The patient had recent prednisone jump for treatment of her Bells Palsy.  She is not on any prednisone at the time of discharge.  #5 - ANEMIA:  The patients hemoglobin dropped a little bit during admission from 12 down to 9.9, but was back to 11.6 at the time of  discharge.  It will  be followed closely at the dialysis center.  #6 - ELEVATED LIVER FUNCTION TESTS:  This was addressed by Dr. Cristina Gong during his initial consultation.  The patient is status post cholecystectomy with current Enterobacter sepsis.  The patient did not have any upper abdominal pain or tenderness.  He said that her transaminase increase could be secondary to septic cholangitis, but also could be a reactive hepatopathy.  Liver function tests were significantly increased from her April labs.  However, by May 14, her ALT (which was 251) was down to 30.  Her Alk phos was decreased somewhat from 482 to 379.  Her total bilirubin was the same at 1.0.  Her AST remained normal the entire admission.  We will watch these conservatively in the outpatient setting.  #7 - SINUS DRAINAGE:  The patient was treated empirically with Claritin D with improvement in symptoms during her hospitalization.  CONDITION ON DISCHARGE:  Improved.  The patient was significantly better.  DISCHARGE MEDICATIONS:  1. Nephro-Vite one tablet per day.  2. PhosLo four tablets with meals three times a day.  3. Cipro 500 mg per  day x 9 more days.  4. Asacol 400 mg three tablets three times a day.  5. Pancrease one tablet three times a day with meals.  6. Protonix 40 mg q.h.s.  7. Cortifoam twice a day.  8. Rowasa suppository at bedtime.  9. Toprol XL 50 mg at bedtime. 10. Altace 2.5 mg per day at bedtime. 11. Procardia 16 mg per day.  DIET:  70 g protein, 2 g sodium, 2 g potassium diet.  Limit fluids to five cups per day.  SPECIAL INSTRUCTIONS:  Dialysis scheduled on Monday, Wednesday and Friday at [facility].  The patient is to have surveillance blood cultures drawn x 2 on May 31.  She is to have a hemoglobin each treatment x 6 more treatments.  Of note, on May 13, this was 11.3.  The patient is to receive INFeD 100 mg IV x 7 more doses than 100 mg per week.  Epogen dose is 20,000 units.  Calcijex 3.0  mcg.  Estimated dry weight 55 kg. DD:  10/11/00 TD:  10/12/00 Job: [id number]

## 2010-09-24 NOTE — Op Note (Signed)
   NAME:  [name], MESLER                        ACCOUNT NO.:  [id number]   MEDICAL RECORD NO.:  [id number]                   PATIENT TYPE:  AMB   LOCATION:  ENDO                                 FACILITY:  [facility]   PHYSICIAN:  [name]. Rolla Flatten., M.D.          DATE OF BIRTH:  1941-01-16   DATE OF PROCEDURE:  02/14/2002  DATE OF DISCHARGE:                                 OPERATIVE REPORT   PROCEDURE:  Esophagogastroduodenoscopy.   MEDICATIONS:  Cetacaine spray, fentanyl 25 mcg, Versed 2.5 mg IV.   INDICATIONS:  Patient with a history of ulcer disease, right upper quadrant  pain, who has been on Protonix.  She has had previous cholecystectomy, not  really sure what is going on, but in view of her continued pain we felt that  we ought to go ahead with endoscopy.   DESCRIPTION OF PROCEDURE:  With the patient in the left lateral decubitus  position, the scope was inserted and advanced under direct visualization.  The stomach was entered, pylorus identified and passed.  The duodenum,  including the bulb and second portion, was seen well and was unremarkable.  The second portion of the duodenum was normal, and there was no active  ulceration.  The pyloric channel, antrum, and body were completely normal.  Fundus and cardia were seen well on retroflexed view.  The patient did have  a hiatal hernia.  The GE junction was widely patent.  The distal esophagus  was somewhat reddened.  The proximal esophagus was normal.  The scope was  withdrawn.  The patient tolerated the procedure well.   ASSESSMENT:  1. Hiatal hernia with likely gastroesophageal reflux disease.  2. Right upper quadrant pain, no active ulceration.  May be due to adhesions     from her cholecystectomy and her previous ulcer surgery.   PLAN:  Will continue on the Protonix, see back in the office in two to three  months.                                               [name]. Rolla Flatten., M.D.    Jaynie Bream  D:  02/14/2002   T:  02/14/2002  Job:  [id number]   cc:   Louis Meckel, M.D.

## 2010-09-24 NOTE — Op Note (Signed)
[facility]  Patient:    [name], [name]                     MRN: [id number] Adm. Date:  [id number] Attending:  Sherrin Daisy CC:         Joyice Faster. Oletta Lamas, M.D.   Operative Report  PREOPERATIVE DIAGNOSIS:       Internal bleeding hemorrhoids, chronic renal failure.  OPERATION:                    Internal and external hemorrhoidectomy, two quadrants.  ANESTHESIA:                   General.  HISTORY:                      [name] is a 70 year old chronic renal failure patient who I have known for years.  She has had bleeding ulcers.  She has had a sigmoid diverticulitis.  More recently she has had problems with intermittent bleeding and at some times she has appears to be what is ulcerative colitis and at other times appears to be just hemorrhoids.  I saw her about a month ago and she was having significant symptoms and wanted to proceed with hemorrhoidectomy.  Then she basically decided that she did not and this past weekend was admitted on acute basis with ______ bleed, dripping blood from the anus after a bowel movement.  I was asked to see the patient and on physical examination she really was not hemorrhaging.  Dr. Oletta Lamas repeated the flexible sigmoidoscopy and noted that this looked like this was kind of a hemorrhoid fissure type bleeding source and not really horrible ulcerative proctitis.  I saw her.  She had her hemodialysis yesterday and she wanted to proceed on and I thought it would be satisfactory.  On physical examination she has a lot of redundant external tissue but her hemorrhoids itself are not as bad as I have seen them.  Since has got a ______ and I did give her 1 g of Vancomycin today in preparation for surgery. The patient was taken to the operative suite in preparation for surgery.  Patient was taken to the operative suite.  Induction of general anesthesia endotracheal tube and placed up in lithotomy position.  In  this position first used the ______  and you can see that she is bleeding from the anterior right quadrant and then an area posterior to the left.  The two quadrants are kind of equal in size.  I did not really find three complexes but I expect this has been related to the multiple treatments and the sclerotherapy, etcetera that she has had through the years.  I first elevated the superior right anterior quadrant on a pedicle.  The little mucosa edges were sutured with 3-0 Chromic and then the pedicle was taken on up where I then sutured the pedicle with a 2-0 Chromic. The mucosa itself was thick enough that I really did not use the ______ clamp and then closed the suture line with a running 2-0 Chromic.  This, because of the wide segment of mucosa that was removed, sort of constricted the anus somewhat but then we did a similar on the large hemorrhoid complex on the left posterior lateral quadrant.  At the completion you can still get your index finger in the anus easily.  I inspected both suture lines.  There is no  evidence of active bleeding.  I had injected her preoperatively, or really after prepping her with Betadine scrub and solution with Marcaine with adrenalin.  I then put a piece of rolled up gel foam up into the anal canal and surgery was completed.  We will keep her in the hospital at least for a couple of days.  Tomorrow is her normal hemodialysis day and hopefully she will not have any significant problems with bleeding with bowel movements. This tissue was sent for path examination and maybe we can be a little sure whether this is inflammatory bowel disease or just basically hemorrhoids inflamed. DD:  12/21/99 TD:  12/21/99 Job: [id number]

## 2010-09-24 NOTE — Discharge Summary (Signed)
[place]. [facility]  Patient:    [name], [name]                       MRN: [id number] Adm. Date:  [id number] Disc. Date: [id number] Attending:  Sherrin Mcfarland CC:         [name]. [name], M.D.             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  FINAL DIAGNOSES: 1. Ulcerative colitis. 2. Gastrointestinal bleeding with secondary post hemorrhagic anemia. 3. End-stage renal disease. 4. History of systemic lupus erythematosus. 5. Hypertension. 6. Anal fissure.  PROCEDURES: 1. Patient had had an endoscopy performed at [facility] outpatient.  This was    a sigmoidoscopy showing diffuse colitis with biopsies confirming    ulcerative colitis. 2. Injection of dialysis catheter with TPA to keep dialysis catheter patent.  PERTINENT HISTORY:  Patient is a nice woman who is well-known to me.  She has end-stage renal disease and is on dialysis, is a Designer, television/film set.  She had previous diverticulitis and had a previous two-stage procedure for this by [name].  Colonoscopy a year ago showed granulation tissue, everything else seemed to be fine.  She was treated with ______ suppositories.  For two months she has been having tenesmus with rectal bleeding, watery stools, 10-12 a day.  Various ointments have been used without relief.  Outpatient sigmoidoscopy was obtained, was performed at [facility] due to scheduling, and this revealed diffuse ulcerative colitis to 60 cm.  The patient was transferred to [facility] since [facility] did not have facilities for hemodialysis, which she requires.  She is a Designer, television/film set, and it was felt that aggressive therapy was needed.  For more details, please see dictated admission History and Physical.  PHYSICAL  EXAMINATION:  VITAL SIGNS:  Patient is afebrile, pulse 90, blood pressure 160/80.  GENERAL:  A pleasant female, somewhat somnolent after colonoscopy.  HEART AND LUNGS:  Normal.  ABDOMEN:  Soft, slightly distended, nontender.  RECTAL:  Did reveal a posterior anal fissure.  For more details, please see dictated admission History and Physical.  HOSPITAL COURSE:  Patient was admitted to the renal floor and started on IV steroids and iron.  Consultation with [name] and the nephrology team was obtained.  This was done to manage her fluids and dialysis, which she received while in the hospital.  She was given, initially, only liquid on her oral medications.  She was given Analpram for her anal fissure.  She did have problems with her dialysis catheter, and this was injected with TPA and worked fine following that.  Her diarrhea was worked up with negative C difficile toxin, negative ova and parasites, negative culture for enteric pathogens. She did continue to have occult blood.  Her labs did reveal marked iron deficiency with saturations of 42% with low iron, but normal ferratin, which was felt to be an acute phase reactant.  Patient was started on renal clear liquids and dietary consultation was performed.  She was also started on high protein supplements.  She was maintained on Asacol.  Due to the fact that she needed  to be both on a renal failure diet as well as a low residue diet, we had dietary see her and go over this extensively with her.  She was ultimately started on full liquids, tolerated this well, started on oral prednisone.  By February 26, she was tolerating diet and prednisone quite well, felt to be in satisfactory condition for discharge.  She had no further bleeding, was down to only two to three loose bowel movements a day that were semi-formed.  DISPOSITION:  Patient is discharged home in improved condition.  MEDICATIONS: 1. Toprol XL 50 mg q.d. 2. Procardia XL  50 mg q.d. 3. Combivent inhaler 2 puffs q.i.d. 4. ______ 800 mg t.i.d. 5. Asacol 400 mg 3 tablets t.i.d. 6. Prednisone 20 mg b.i.d.  DIET:  She will slowly advance from a liquid diet with supplements to a renal failure low residue diet, in which she has been instructed extensively by the dietician.  FOLLOW-UP:  She will follow up with [name] on Tuesday, March 6 at 4:15 p.m.  She will continue to follow up with the [facility] for her hemodialysis as usual, on a Monday/Wednesday/Friday schedule. DD:  08/11/99 TD:  08/13/99 Job: [id number]

## 2010-09-24 NOTE — Procedures (Signed)
[place]. [facility]  Patient:    [name], [name]                     MRN: [id number] Proc. Date: 09/14/00 Adm. Date:  [id number] Attending:  Franchot Erichsen CC:         Joyice Faster. Deterding, M.D.   Procedure Report  PROCEDURE:  Flexible sigmoidoscopy with biopsy.  MEDICATIONS:  None.  This was done in an unprepped state.  INDICATION:  A nice woman who is hospitalized with gram-negative sepsis.  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  She has had some loose stools, has been on antibiotics.  The issue is whether her ulcerative colitis is active, where she has C. difficile colitis.  She is complaining of some tenesmus.  DESCRIPTION OF PROCEDURE:  The procedure had been explained to the patient and consent obtained.  We did not have IV access and therefore chose to go ahead with this in the unsedated state.  Viscous Xylocaine was used around her anus. We put the scope and advanced it.  There was solid stool in the rectum and sigmoid.  We advanced up to 40 cm.  There was no colitis, no pseudomembranes. The mucosa had a good vascular pattern and appeared completely normal up to this level.  In the rectum, there was slight granularity.  Several biopsies were taken.  It seemed to be slightly friable.  The scope was withdrawn.  The patient tolerated this procedure well.  ASSESSMENT: 1. Slight granular changes in the rectum consistent with an ulcerative    proctitis in remission.  No active ulcerative colitis, no pseudomembranous    colitis. 2. Anal stenosis, a chronic problem following hemorrhoidectomy.  PLAN:  Will resume Cortifoam in the a.m. and Rowasa suppositories in the p.m. I do not think her ulcerative proctitis is a factor at this time. DD:  09/14/00 TD:  09/14/00 Job: [id number]

## 2010-09-24 NOTE — Procedures (Signed)
[place]. [facility]  Patient:    [name], [name]                       MRN: [id number] Proc. Date: 09/28/99 Adm. Date:  [id number] Disc. Date: [id number] Attending:  Sherrin Daisy CC:         Joyice Faster. Oletta Lamas, M.D.             Windy Kalata, M.D.                           Procedure Report  PREOPERATIVE DIAGNOSIS:  Bilateral hydronephrosis with ureteral stones in a patient with end-stage renal disease.  POSTOPERATIVE DIAGNOSIS:  Bilateral hydronephrosis with ureteral stones in a patient with end-stage renal disease.  PROCEDURES: 1. Cystoscopy. 2. Bilateral retrograde pyelograms with interpretation. 3. Insertion of right double-J stent. 4. Left ureteroscopy. 5. Insertion of left upper pole and left lower pole double-J stents.  SURGEON:  Marshall Cork. Jeffie Pollock, M.D.  ASSISTANT:  ANESTHESIA:  MAC.  DRAINS:  Three 6 French x 24 cm double-J stents.  COMPLICATIONS:  None.  INDICATIONS:  [name] is a 70 year old black female with lupus, ulcerative colitis, and end-stage renal disease, who has had fever and left flank pain. She was found on CT scan to have bilateral hydronephrosis with bilateral stones.  It was felt that cystoretrograde and possible stenting were indicated.  DESCRIPTION OF PROCEDURE:  The patient was taken to the operating room where she was placed in the lithotomy position.  IV sedation was given.  Her perineum and genitalia were prepped in the usual fashion with Betadine.  Her urethra was instilled with 2% lidocaine jelly.  She was draped in the usual sterile fashion.  A cystoscopy was performed with a 20 [name] scope and the 12 and 70 degree lenses.  Examination normal a normal urethra of the bladder wall with small capacity and friability, but no tumors or stones were identified. The left ureteral orifice was identified with some difficulty.  Contrast was instilled in a retrograde fashion.  This demonstrated an unremarkable  ureter up to what appeared to be the upper pole moity of a probable duplicated system.  There were radiolucent stones in the UPJ area with mild dilation of this portion of the collecting system.  I then reinspected the area around the ureteral orifice and did not detect a second orifice.  Initial contrast instillation did not demonstrate the takeoff of the lower pole ureter.  I then turned my attention to the right collecting system.  I had to use a guide wire to cannulate the orifice and then passed an opening catheter over the wire. The wire was removed.  Contrast was instilled in a retrograde fashion.  This revealed a normal ureter up to the kidney, which had only mild fullness of the collecting system, but stones at the UPJ, as well as additional stones in the renal pelvis and the upper pole.  I felt that although there was not much dilation in light of her lower urine output and the apparent obstructing stones that stenting was indicated.  The guide wire was then replaced through the opening catheter to the kidney and a 6 French 24 cm double-J stent was inserted.  I then turned my attention back to the left ureteral orifice, which was cannulated with a guide wire.  I then passed a ureteroscope along side the wire, up the orifice, and was eventually  able to detect the takeoff of the lower pole ureter approximately 2-3 cm proximal to the meatus.  There was a stone apparent at this takeoff.  I then advanced a 0.35 guide wire up into the ureter, passed an opening catheter over the guide wire, removed the guide wire, and instilled contrast.  This demonstrated air bubbles in the proximal ureter, but I was unable to get contrast instilled back into the kidney.  I then passed a glide wire through the opening catheter and was able to advance it into the lower pole moity.  I then advanced the opening catheter, removed the glide wire, instilled contrast, and demonstrated a markedly  hydronephrotic lower pole segment with filling defects consistent with stones in the pelvis. I then advanced a standard guide wire to the kidney, backed the opening catheter out, and inserted a 6 French 24 cm double-J catheter to the lower pole system.  I then placed the cystoscope back over the wire to the upper pole and inserted a 6 French 24 cm double-J stent to that location as well. Upon removal of the wire, all stents were noted to be in good position with coils in the kidney and coils in the bladder.  At this point, the bladder was drained, the scope was removed, the patient was taken down from lithotomy position, her anesthetic was reversed, and she was moved to the recovery room in stable condition.  There were no complications. DD:  09/28/99 TD:  10/02/99 Job: [id number]

## 2010-09-24 NOTE — H&P (Signed)
NAME:  [name], [name]                        ACCOUNT NO.:  [id number]   MEDICAL RECORD NO.:  [id number]                   PATIENT TYPE:  INP   LOCATION:  5019                                 FACILITY:  [place]   PHYSICIAN:  Sol Blazing, M.D.             DATE OF BIRTH:  [birth year]   DATE OF ADMISSION:  05/31/2003  DATE OF DISCHARGE:                                HISTORY & PHYSICAL   REASON FOR ADMISSION:  Fever and chills.   HISTORY:  The patient is a 70 year old black female with a history of lupus,  Crohn's disease and end-stage renal disease who presents with a less than 24  hour history of fever, chills and myalgias.  She has no focal specific  symptoms.  She had a temperature of 102 earlier today at home and though she  could wait it out, but she came to the emergency room this evening, and her  temperature is 102.5.  She had shaking chills this morning.  She had  dialysis yesterday without incident.  Several days ago, on Monday, five days  prior to admission, she sustained an infiltration of her left thigh A-V  graft and had subsequent chills and fever during that same dialysis  treatment.  According to her recollection, she received antibiotics on  Monday and Wednesday and the cultures came out negative and her fever  subsided promptly.  Also, she noticed that her Crohn's disease is currently  active with large diarrheal stools usually once a day, sometimes twice  with some blood associated.  She denies any abdominal pain.  No sputum  production, headache, sore throat, sinus congestion, ear pain, abdominal  pain.  She does not make urine anymore.  For her Crohn's disease, her last  flare was in the summer of 2004 at which time her prednisone was increased  from 10-80 mg and was slowly tapered back to 10 mg over several months which  is her current maintenance dose.   PAST MEDICAL HISTORY:  1. SLE.  2. Crohn's disease status post multiple abdominal surgeries.  3. ESRD,  dialysis Monday, Wednesday and Friday with a 56 mg dry weight.  4. 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  5. Right nephrectomy, November 7510, no complications.  6. Chronic pancreatitis.  7. History of perforated duodenal ulcer.   ALLERGIES:  1. AMOXICILLIN.  2. PENICILLIN.  3. SEPTRA.  4. TETRACYCLINE.  5. ERYTHROMYCIN.  She has taken Ancef without difficulty.   MEDICATIONS:  1. Nephro-Vite one a day.  2. Procardia XL 60 every day.  3. Altace 2.5 every day.  4. Toprol XL 100 every day.  5. PhosLo 667 3 a.c. t.i.d.  6. Asacol 400 mg 3 a.c. t.i.d.  7. Pancrease 1 a.c. t.i.d. and h.s.  8. Protonix 40 every  day.  9. Prednisone 10 every day.  10.      Cortifoam rectal applicator q.h.s. p.r.n.  11.      Canasa suppository one q.h.s. p.r.n.   SOCIAL HISTORY:  The patient lives at home with her husband.  No tobacco or  alcohol use.   REVIEW OF SYSTEMS:  GENERAL:  Denies weight loss.  HEENT:  As above.  CARDIORESPIRATORY:  Denies chest pain, otherwise as above.  GI:  As above.  GU:  As above.  MUSCULOSKELETAL:  Denies joint pain or swelling.  NEUROLOGIC:  No history of stroke, TIA or seizure.  No focal numbness or  weakness.  PSYCHIATRIC:  No history of depression, anxiety or  hallucinations.  ENDOCRINE:  Denies heat or cold intolerance or a history of  thyroid problems.   PHYSICAL EXAMINATION:  VITAL SIGNS:  Temperature 102.5, blood pressure  160/90, pulse 126, respirations 18.  Repeat vital signs temperature 98.9,  blood pressure 140/70, pulse 110, respirations 20, 99% O2 saturation on 2  liters of oxygen.  GENERAL:  The patient is an alert and oriented middle-aged female in no  acute distress.  She does not appear toxic.  SKIN:  Without rash.  HEENT:  PERRL,  EOMI.  Throat was clear.  NECK:  Supple without JVD.  CHEST:  Clear throughout.   CARDIAC:  Regular, rate and rhythm without murmur, rub or gallop.  ABDOMEN:  Multiple scars, well healed.  No organomegaly.  Nontender.  Active  bowel sounds.  No CVA tenderness.  EXTREMITIES:  Left thigh A-V graft with positive thrill and bruit.  Anterior  above and medial to the graft, there is a soft infiltration area with  ecchymosis but no discrete indurated areas or fluctuance or erythema.  The  area is nontender.  She has old grafts on her upper extremities which look  benign.  NEUROLOGIC:  Alert and oriented  x 3.  Grossly nonfocal motor exam.   LABS:  White blood count 16,000, hemoglobin 14, platelets 127.  Other labs  are pending.   IMPRESSION:  1. Fever rule out sepsis, possible A-V graft infected hematoma of the left     thigh, though clinically it does not look that bad on exam.  She also has     had fevers in the past in response to her Crohn's disease which is     currently active.  She does not make urine and there are no chest     findings on exam or chest symptoms.  A more remote possibility is a     complication of her right nephrectomy, although she is two months out     from the nephrectomy and the retroperitoneal abscess formation at this     point would seem unlikely.  2. End-stage renal disease.  Dialyzed yesterday.  3. Inactive lupus.  4. Crohn's disease, moderately active.  5. Hypertension on Procardia, Toprol and Altace with decent control.   PLAN:  1. Admit.  2. IV antibiotics.  3. Followup blood cultures.  4. If she has positive blood cultures, we will need to have surgery look at     the left thigh A-V graft for possible infection.  If the cultures are     negative, and she remains febrile will need to consider possibility of a     Crohn's flare and increasing her prednisone dose, also we will consider     CT of the abdomen.  Sol Blazing, M.D.   RDS/MEDQ  D:  05/31/2003  T:  06/01/2003  Job:   [id number]

## 2010-09-24 NOTE — Op Note (Signed)
NAME:  [name], [name]                        ACCOUNT NO.:  [id number]   MEDICAL RECORD NO.:  [id number]                   PATIENT TYPE:  INP   LOCATION:  5523                                 FACILITY:  [facility]   PHYSICIAN:  Lucina Mellow. Terance Hart, M.D.             DATE OF BIRTH:  01-22-1941   DATE OF PROCEDURE:  03/20/2003  DATE OF DISCHARGE:                                 OPERATIVE REPORT   PREOPERATIVE DIAGNOSIS:  Right renal mass.   POSTOPERATIVE DIAGNOSIS:  Right renal mass.   PROCEDURE:  Right renal exploration with right radical nephrectomy.   SURGEON:  Lucina Mellow. Terance Hart, M.D.   ASSISTANT:  Berdie Ogren, M.D.   ANESTHESIA:  General endotracheal.   ESTIMATED BLOOD LOSS:  10 mL.   COMPLICATIONS:  None.   SPECIMENS:  1. Right kidney.  2. Right perihilar lymph node.   DISPOSITION:  To postanesthesia care unit in a stable condition.   INDICATIONS FOR PROCEDURE:  Ms. Co is a 70 year old African-American  female with a history of end-stage renal disease on chronic hemodialysis,  secondary to lupus.  The patient has acquired cystic renal disease with  dialysis and has been found to have bilateral renal masses, worrisome for  renal cell carcinoma.  She is status post left radical nephrectomy at [facility], which was, in fact, renal cell  carcinoma.  Postoperatively, however, she developed a left retroperitoneal  hematoma with an abscess.  This has delayed her planned right radical  nephrectomy, and at this time, the patient wanted to have the procedure done  closer to home.  She has seen Dr. Terance Hart in consultation for her left  radical nephrectomy.  She consented to this after understanding the risks,  benefits, and alternatives.   DESCRIPTION OF PROCEDURE:  The patient was brought to the operating room and  correctly identified by her identification bracelet.  She was given  preoperative antibiotics and general endotracheal  anesthesia.  She was  placed with the right full flank position.  She was prepped and draped in  the typical sterile fashion.  Approximately an 8 cm incision was made on the  tip of the 12th rib to approximately the level of the rectus abdominis  medially.  The skin was incised with a scalpel.  The external oblique,  internal oblique, and transversus abdominis muscles and her respective  fascial layers were carefully incised with the Bovie and electrocautery.  The retroperitoneal space was entered at the tip of the 12th rib.  The  surgeon's finger was placed in the defect beneath the transversalis fascia,  and the peritoneum was pushed medially to avoid entry into the peritoneum  through our incision.  Small vessels that were encountered during the  incision were coagulated with the Bovie electrocautery.  Blunt dissection  was then used to establish the retroperitoneal plane.  The kidney could  easily be palpated and felt  to have multiple cysts within the parenchyma.  Furthermore, the kidney was very atrophic.  The fatty tissue at the lower  pole of the kidney was incised with Bovie electrocautery.  The Metzenbaum  scissors were used to dissect the left ureter.  The ureter was doubly  clipped and cut.  The ureter was then dissected towards the renal hilum.  The renal hilum was very small, but there palpable blood vessels.  Attention  was then turned to the posterior aspect of the kidney, and the fatty  attachments between the retroperitoneum and the Gerota's fascia were incised  with Bovie electrocautery.  The attachments to the superior pole of the  kidney were likewise taken down.  The vessels that were encountered during  this dissection were doubly clipped with large straight clips and then  incised with the Bovie electrocautery.  After this was done, the remaining  attachments of the kidney were the vessels and the hilar tissues.  The  artery and vein were carefully dissected at the  renal hilum, away from the  surrounding tissues.  The smaller branches of the artery and vein that were  encountered were clipped with clips, doubly clipped, and cut.  A right angle  was passed around the very small hilum, and two #1 silk ties were placed  proximally, and one tie was placed distally.  The hilum was incised with a  15 blade scalpel.  There was very minimal bleeding during the entire  dissection.  The kidney was now completely freed from the hilum and was  passed off the table.  At the medial aspect of the hilum, there appeared to  be a large perihilar lymph node.  This was carefully dissected with a right  angle, and the base of the node was clipped with a large clip, and the node  was incised and sent for permanent section.  The wound was then copiously  irrigated.  There was one small vessel near the attachment of the superior  pole of the kidney, which was clipped with a small straight clip.  Further  inspection revealed no further sites of bleeding.  Again, the wound was  copiously irrigated.  The kidney rest was then taken down, and the patient  was taken out of flex.  The transversalis fascia and the internal oblique  fascia were closed with a single running #1 PDS.  Again, the wound was  irrigated, and the external oblique fascia was closed with a single running  PDS.  The skin was closed with a stapling device.  The patient was then  awakened from anesthesia and taken to the postanesthesia care unit in a  stable condition, having tolerated the procedure extremely well.  All  instrument and sponge counts were correct at the end of the case.  Please  note that Dr. Terance Hart was present and participated in all aspects of this  case as he is the primary surgeon.     Berdie Ogren, MD                           Lucina Mellow. Terance Hart, M.D.    DR/MEDQ  D:  03/20/2003  T:  03/20/2003  Job:  [id number]

## 2010-09-24 NOTE — Procedures (Signed)
[place]. [facility]  Patient:    [name], [name]                     MRN: [id number] Proc. Date: 12/19/99 Attending:  Myrtie Cruise                           Procedure Report  PROCEDURE PERFORMED:  Flexible sigmoidoscopy and biopsy.  ENDOSCOPIST:  Joyice Faster. Oletta Lamas, M.D.  MEDICATIONS USED:  Versed 3 mg IV.  INSTRUMENT:  Pediatric video colonoscope.  INDICATIONS:  The patient is a 70 year old woman who is a Neurosurgeon Witness with increasing rectal bleeding.  She has a history of ulcerative colitis, fairly severe internal hemorrhoids and a recent anal fissure.  This was done to evaluate the degree of inflammation of the rectum as contributing to her rectal bleeding.  DESCRIPTION OF PROCEDURE:  The procedure had been explained to the patient and consent obtained.  With the patient in the left lateral decubitus position, the Olympus video colonoscope was inserted.  The patient had exquisite tenderness as we went in where the anal fissure was present.  There was solid stool at approximately 25 cm and we were unable to see beyond that.  There was a fair amount of liquid stool.  She had been prepped with tapwater enemas. The scope was withdrawn.  There was very mild inflammation in the rectum with mild edema and friability.  No ulcerations were seen.  I was unable to retroflex the scope due to inability of the patient to hold the air.  Several biopsies were taken.  There was no ulceration.  The scope was withdrawn and we were unable to really see the fissure due to marked tenesmus and pain and difficulty holding air.  She did have internal hemorrhoids.  ASSESSMENT: 1. Mild ulcerative colitis in the rectum manifested primarily as edema.    Friability with no frank ulcerations.  I dont think this is really the    source of her rectal bleeding although it certainly could be contributing    some. 2. Internal hemorrhoids and anal fissure probably  contributing as well.  PLAN:  Will start her on Cortifoam ____________ suppositories.  Will have Dr. [name] see her and will continue to watch her in the hospital since she is a [place] witness. DD:  12/19/99 TD:  12/19/99 Job: [id number]

## 2010-09-24 NOTE — Op Note (Signed)
[place]. [facility]  Patient:    [name], [name]                     MRN: [id number] Proc. Date: 10/03/00 Adm. Date:  [id number] Attending:  Manuella Ghazi                           Operative Report  PREOPERATIVE DIAGNOSIS:  End-stage renal disease.  POSTOPERATIVE DIAGNOSIS:  End-stage renal disease.  PROCEDURE:  Left femoral arteriovenous Gore-Tex graft placement.  SURGEON:  Rosetta Posner, M.D.  ASSISTANT:  Nurse.  ANESTHESIA:  General endotracheal.  COMPLICATIONS:  None.  DISPOSITION:  To recovery room stable.  DESCRIPTION OF PROCEDURE:  The patient was taken to the operating room and placed in the supine position, where the area of the left groin and left thigh were prepped and draped in the usual sterile fashion.  Incision was made over the femoral pulse, carried down to isolate the common femoral artery, which was of good caliber with minimal atherosclerotic changes.  This was encircled with a blue vessel loop.  Next, the saphenofemoral junction was identified and the common femoral vein was identified just above the saphenofemoral junction. Two separate incisions were made on the distal thigh, and a loop configuration tunnel was created with a Gore tunneler.  A 6 mm standard wall graft was brought through the tunnel.  The common femoral vein was occluded with a partial occlusion clamp, and the anterior wall was opened just above the saphenofemoral junction.  The graft was spatulated and sewn end-to-side to the common femoral vein with a running 6-0 Prolene suture.  The clamp was removed with the vein.  The graft was flushed with heparinized saline and reoccluded. Next, the common femoral artery was occluded proximally and distally, was opened with an 11 blade and extended longitudinally with Potts scissors.  A small arteriotomy was created.  Graft cut to the appropriate length and was sewn end-to-side to the artery with a running 6-0  Prolene suture.  Clamps removed, and an excellent thrill was noted.  The wounds were irrigated with saline and hemostased with electrocautery.  The wounds were closed with 3-0 Vicryl in the subcutaneous and subcuticular tissue, benzoin and Steri-Strips were applied. DD:  10/03/00 TD:  10/03/00 Job: [id number]

## 2010-09-24 NOTE — Consult Note (Signed)
[place]. [facility]  Patient:    [name], [name]                     MRN: [id number] Proc. Date: 09/13/00 Adm. Date:  [id number] Attending:  Franchot Erichsen CC:         Joyice Faster. Deterding, M.D.   Consultation Report  REASON FOR CONSULTATION:  The nephrology service asked [place] to see this 70 year old female because of apparent activation of colitis.  HISTORY OF PRESENT ILLNESS:  [name] was admitted to the hospital two days ago with gram-negative bacteremia, fever, and low blood pressure. The exact source of the gram-negative bacteremia is not clear as of the present time, but she did present with shaking chills. She also has had a wet cough for which she has been on antibiotics for approximately the past week (IV vancomycin and IV tobramycin).  The patient notes that since the antibiotics were started she has had a change in bowel habits. Her baseline is usually one fairly well formed bowel movement a day. For the past week, she had diarrhea with perhaps 10 bowel movements a day (not nocturnally), tenesmus symptoms, and fairly significant intermittent perianal pain, especially, I believe, during the actual defecation. She is passing only small amounts of brown stool. She is not passing mucus or blood as far as she has noted.  Another issue noted since admission is elevated liver chemistries. Whereas, the patients transaminases were normal last month when checked at the kidney center, on admission, the transaminases were in the 300 range, and today, they are in the 150 range, with bilirubin normal, but alkaline phosphatase in the 500 to 350 range.  The patient has had some nausea and vomiting today but did not have upper tract symptoms until today. Specifically, there has been no upper abdominal pain nor any pain radiating through to her back, although she has had a little bit of back soreness.  Note that the patient is status post previous  cholecystectomy.  Past medical history is extremely complicated.  ALLERGIES:  SEPTRA, TETRACYCLINE, ERYTHROMYCIN, AMOXICILLIN, CIPRO, GENTAMICIN, although the exact nature of these "allergies" or drug intolerances is not clear.  OUTPATIENT MEDICATIONS:  Toprol, Altace, Procardia, Protonix, mesalamine suppositories (used twice daily since the diarrhea started in the past week with mild response), maintenance therapy with Asacol three tablets three times daily, Pancrease one capsule with each meal, Calcijex, Epogen.  OPERATIONS:  Remote cholecystectomy, sigmoid resection with temporary colostomy and subsequent takedown for perforated diverticulitis, internal hemorrhoidectomy in August of 2001.  MEDICAL ILLNESSES:  End-stage renal disease due to lupus on dialysis for perhaps 20 years, including some time on peritoneal dialysis complicated by fungal peritonitis; diverticulitis as noted above necessitating surgery; history of intra-abdominal abscesses in 1995 with lysis of adhesion and surgical drainage, possibly related to peritoneal dialysis; history of GI bleeding at her colocolostomy anastomosis requiring colonoscopic intervention a year or two ago; chronic pancreatitis of unclear etiology; remote history of duodenal ulcer with emergency surgery for perforated ulcer in the early 1990s with history of GI bleeding down to a hemoglobin of 1.9 (resulting in grand mal seizures), a Jehovahs Witness status so the patient refuses transfusion; GERD; and bronchospasm.  SOCIAL HISTORY/HABITS:  Not obtained.  FAMILY HISTORY:  Not obtained.  REVIEW OF SYSTEMS:  Pertinent per HPI. No joint pains. No jaundice. No pruritus.  PHYSICAL EXAMINATION:  GENERAL:  [name] is currently on dialysis so a limited exam was performed.  VITAL  SIGNS:  She does not feel febrile. She has been afebrile earlier today when checked on the floor, blood pressure has been running around 140/90, with pulse of 90  to 100, respirations 20 and unlabored.  HEENT:  She is anicteric.  CHEST:  Although the patient has a wet cough, auscultation of the lungs actually sounds fairly clear.  HEART:  Normal without gallops, rubs, clicks, murmurs, or arrhythmias.  ABDOMEN:  Active bowel sounds. No bruits. Somewhat adipose. No guarding, mass, or tenderness, and, in particular, no right upper quadrant guarding or tenderness.  RECTAL:  Not performed since the patient is supine on the dialysis table and since she has severe pain with manipulation of the perianal area anyway.  LABORATORY DATA:  Hemoglobin has dropped from 12.1 to 11.2 over the past 24 hours, white count has risen from 17,600 to 25,000 over the same period. Liver chemistries have fallen as follows:  Alkaline phosphatase 482 to 358, AST from 320 to 156, ALT from 251 to 184, total bilirubin from 1 to 0.8.  IMPRESSION: 1. Diarrhea, tenesmus, and perianal pain which sounds most compatible with an    activation of her distal ulcerative colitis, initially diagnosed    colonoscopically about a year ago by Jeneen Rinks L. Edwards, M.D. (there was    colitis to about 60 cm with ulcerations). Although conceivably,    pseudomembranous colitis could present in a similar fashion, especially in    view of the patients recent antibiotic therapy, and this could readily    account for her significant leukocytosis. The patient has had a    hemorrhoidectomy, but there has also been a previous question of an anal    fissure. I could readily see some of the patients perianal pain arising    from a fissure, either from trauma from multiple bowel movements or simply    as a manifestation of ulcerative colitis. 2. Elevated liver chemistries (recent onset) status post cholecystectomy with    current gram-negative bacteremia of undetermined source. Despite the    absence of upper abdominal pain or tenderness, the fact that the patient    has had chills and fever and a  transient bump in her transaminases makes me     wonder about septic cholangitis. Another possibility would be "reactive    hepatopathy." 3. Cough, as well as fever and leukocytosis, raising the question of some sort    of pulmonary infection.  RECOMMENDATIONS FROM THE GASTROINTESTINAL PROSPECTIVE: 1. Check stool for C. difficile. 2. Flexible sigmoidoscopy in the a.m. (to be performed by Jeneen Rinks L. Edwards,    M.D., who is familiar with the patient) to assess the degree of colitis    activity. 3. Consider evaluation of the common duct if the source of the patients    gram-negative sepsis remains unclear. A bedside ultrasound might be a good    place to start, although, ultimately, the patient might need an MRCP or    even an ERCP to definitively look at the CBD.  We appreciate the opportunity to have seen this patient with you. DD:  09/13/00 TD:  09/14/00 Job: [id number]

## 2010-09-24 NOTE — H&P (Signed)
NAME:  [name], [name]                        ACCOUNT NO.:  [id number]   MEDICAL RECORD NO.:  [id number]                   PATIENT TYPE:  INP   LOCATION:  2550                                 FACILITY:  [place] [facility]   PHYSICIAN:  Berdie Ogren, MD                     DATE OF BIRTH:  1940-12-12   DATE OF ADMISSION:  03/20/2003  DATE OF DISCHARGE:                                HISTORY & PHYSICAL   CHIEF COMPLAINT:  Right renal mass.   HISTORY OF PRESENT ILLNESS:  Ms. [name] is a 70 year old African-American  female who has end-stage renal disease secondary to systemic lupus  erythematosus.  She has been on hemodialysis for several years and has known  acquired cystic renal disease of dialysis.  As such, she has developed  bilateral renal masses which were consistent with renal cell carcinoma.  She  previously underwent a left radical nephrectomy at [facility].  Postoperatively, she developed a retroperitoneal  hematoma.  That has since resolved, and the patient was seen by Dr. Terance Hart  to have her right radial nephrectomy.  She is to undergo that procedure  today and is on a dialysis schedule of Monday, Wednesday, and Friday.  She  will have dialysis again tomorrow.  She will be admitted to the urology  service for her convalescence postoperatively.  We will also contact the  nephrology service to follow her while she is in the hospital to manage her  dialysis.   PAST MEDICAL HISTORY:  1. End-stage renal disease.  2. Crohn's disease.  3. Lupus.  4. Secondary hyperparathyroidism.   PAST MEDICAL HISTORY:  1. Status post left thigh AV graft for dialysis.  2. Status post left radical nephrectomy.  3. Status post multiple abdominal surgeries for her Crohn's disease.   MEDICATIONS:  1. Nephro-Vite one p.o. q. day.  2. PhosLo 667 one before every meal.  3. Pancrease one before each meal and at bedtime.  4. Protonix 40 mg p.o. q. day.  5. Procardia 60  mg p.o. q. day.  6. Altace 2.5 mg p.o. q.h.s.  7. Toprol XL 100 mg p.o. q.h.s.   ALLERGIES:  1. CIPRO.  2. AMOXICILLIN.  3. SEPTRA.  4. TETRACYCLINE.  5. ERYTHROMYCIN.   PHYSICAL EXAMINATION:  GENERAL:  Alert and oriented x3, in no acute  distress.  VITAL SIGNS:  Temperature 97.2, pulse 80, respirations 16, blood pressure  162/87.  HEENT:  Head is normocephalic, atraumatic.  Extraocular movements are  intact.  Oropharynx is clear without erythema or exudate.  LUNGS:  Clear to auscultation bilaterally.  HEART:  Regular rate and rhythm without murmurs, rubs, or gallops.  ABDOMEN:  Soft, nontender, nondistended, the patient has a well-healed  midline scar.  She has a well-healed left flank scar.  EXTREMITIES:  Warm and well perfused.  The patient has an AV graft in her  left thigh which has a palpable thrill.  NEUROLOGIC:  No focal motor or sensory deficits are appreciated.   IMPRESSION:  1. Chronic renal failure secondary to lupus.  2. Acquired cystic renal disease of dialysis.  3. Right renal mass worrisome for renal cell carcinoma.   PLAN:  The patient is to undergo right radial nephrectomy today and will be  admitted for her convalescence.  She will be followed by the nephrology  service for her dialysis needs.                                                Berdie Ogren, MD    DR/MEDQ  D:  03/20/2003  T:  03/20/2003  Job:  [id number]

## 2010-09-24 NOTE — Discharge Summary (Signed)
[place]. [facility]  Patient:    [name], [name]                     MRN: [id number] Adm. Date:  [id number] Disc. Date: [id number] Attending:  Allyn Kenner CC:         Joyice Faster. Deterding, M.D.   Discharge Summary  FINAL DIAGNOSES: 1. Secondary hyperparathyroidism. 2. End-stage renal failure. 3. Status hemodialysis.  HISTORY OF PRESENT ILLNESS:  This 70 year old woman with end-stage renal failure is on hemodialysis.  She was diagnosed with secondary hyperparathyroidism which failed to respond to medical therapy.  She was therefore referred for total parathyroidectomy.  PHYSICAL EXAMINATION:  The physical examination was positive for a dialysis catheter in the right subclavian vein, as well as multiple scars of her abdomen and multiple scars on her arms from previous Gore-Tex grafts for dialysis access.  HOSPITAL COURSE:  On the day of admission, he underwent bilateral neck exploration for parathyroid glands.  Only three hyperplastic parathyroid glands were found.  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  For that reason, a left thyroid lobectomy was also performed in the event that she had an intrathyroidal parathyroid gland, but none was found.  Parathyroid tissue was transplanted to the left forearm as well in the event that the patient only had three parathyroid glands.  She tolerated the procedure well and had an uneventful postoperative course, being discharged on the third postoperative day, at which time her voice was normal, her swallowing was normal, and her incision was clean and dry.  Her serum calcium was stable at 8 mg%.  CONDITION ON DISCHARGE:  Satisfactory.  DIET:  To be a renal diet.  MEDICATIONS ON DISCHARGE: 1. Toprol XL 50 mg daily. 2. Altace 2.5 mg daily. 3. Procardia XL 60 mg daily. 4. Nephro-Vite one daily. 5. Protonix 400 mg at bedtime. 6.  Pancrease one with meals. 7. Rowasa 500 suppositories one per rectum b.i.d. 8. Calcium tablets 500 mg four with meals three times a day. 9. Asacol 400 mg three pills three times a day.  SPECIAL INSTRUCTIONS:  Her hemodialysis was to continue on Mondays, Wednesdays, and Fridays at the [facility] and it was to contain Calcijex 4 mcg IV and EPO 3000 units IV.  FOLLOW-UP:  She was to return to our office in four to five days for follow-up. DD:  06/16/00 TD:  06/18/00 Job: [id number]

## 2010-09-24 NOTE — Op Note (Signed)
   NAME:  [name], [name]                        ACCOUNT NO.:  [id number]   MEDICAL RECORD NO.:  [id number]                   PATIENT TYPE:  INP   LOCATION:  5528                                 FACILITY:  [facility]   PHYSICIAN:  [name]. Rolla Flatten., M.D.          DATE OF BIRTH:  1940/11/09   DATE OF PROCEDURE:  08/09/2002  DATE OF DISCHARGE:                                 OPERATIVE REPORT   PROCEDURE:  Flexible sigmoidoscopy and biopsy.   MEDICATIONS:  Fentanyl 50 mcg, Versed 5 mg IV.   ENDOSCOPE:  Olympus pediatric scope.   INDICATIONS:  A 70 year old woman with multiple medical problems on  hemodialysis, who has had a recent nephrectomy for renal cancer.  Has  perioperative abscesses that are being drained.  She does have a history of  Crohn's disease and has undergone previous perirectal surgery by Orson Ape.  [name], M.D.  Has had increase in diarrhea.  C. difficile toxins have  been negative.  This is done to look for evidence of the presence of  pseudomembranes or active Crohn's disease.   DESCRIPTION OF PROCEDURE:  The procedure had been explained to the patient  and consent obtained.  Digital exam was performed.  The patient had obvious  perirectal disease from her previous Crohn's surgery.  After the pediatric  scope was inserted, we were able to advance easily.  The mucosa was normal  up to 60 cm with an oatmeal/grits consistency stool present in the left  colon.  Multiple random biopsies were taken.  There was no evidence of  active Crohn's, a good vascular pattern throughout the mucosa that was seen.   ASSESSMENT:  No grossly active Crohn's but with evidence of previous  perirectal Crohn's.  No evidence of pseudomembranous colitis.   PLAN:  Will add Questran to her medical regimen and continue her on 5-ASA  preparation and follow clinically.                                                [name]. Rolla Flatten., M.D.    Jaynie Bream  D:  08/09/2002  T:  08/11/2002   Job:  [id number]   cc:   Caren Griffins B. Lorrene Reid, M.D.  [address]  [place]  [place] [address]  Fax: [phone]

## 2010-09-24 NOTE — Consult Note (Signed)
[place]. [facility]  Patient:    [name], [name]                       MRN: [id number] Proc. Date: 09/27/99 Adm. Date:  [id number] Attending:  Sherrin Daisy Dictator:   [phone] CC:         Joyice Faster. Oletta Lamas, M.D.             Windy Kalata, M.D.                          Consultation Report  REASON FOR CONSULTATION: Left flank pain.  HISTORY OF PRESENT ILLNESS: This patient is a pleasant 70 year old black female with a history of lupus, with end-stage renal disease, admitted on Sep 24, 1999 with fever, left abdominal pain, and diarrhea, with a presumed flare of ulcerative colitis.  A CT scan of the abdomen demonstrated bilateral urinary calculi with bilateral obstruction, left greater than right, as compared to CT scan in 1998.  She reports intermittent fever and pain since February of this year that was recently attributed to ulcerative colitis.  She has had severe oliguria and has been anuric this admission.  She denies hematuria or dysuria.  She has had nausea and anorexia.  Her urologic history is significant for bilateral renal biopsy.  ALLERGIES:  1. SULFA-TRIMETHOPRIM.  2. ERYTHROMYCIN.  3. PENICILLIN.  4. CIPRO.  5. LABETALOL.  CURRENT MEDICATIONS:  1. Calcitrol.  2. Epogen.  3. Os-Cal.  4. Solu-Medrol.  5. Combivent.  6. Toprol XL.  7. Procardia XL.  8. Vancomycin.  9. Gentamicin. 10. Protonix. 11. Asacol.  PAST MEDICAL HISTORY:  1. Lupus.  2. End-stage renal disease.  3. Ulcerative colitis.  4. Acquired cystic disease.  5. Gastroesophageal reflux disease.  PAST SURGICAL HISTORY:  1. Multiple vascular access procedures.  2. Open right renal biopsy.  3. Percutaneous left renal biopsy.  4. Exploratory laparotomy for perforated ulcer.  5. Exploratory laparotomy for abdominal abscesses.  6. Exploratory laparotomy for diverticulitis with colostomy.  7. Colostomy takedown.  8. Cholecystectomy.  SOCIAL HISTORY: Negative  for tobacco or alcohol.  FAMILY HISTORY: Noncontributory.  REVIEW OF SYSTEMS: She has had recent weight loss and weakness, as well as diarrhea, but no chest pain or shortness of breath.  She is otherwise without complaints except as above.  PHYSICAL EXAMINATION:  VITAL SIGNS: Temperature 99.3 degrees, blood pressure 146/76, respirations 20, pulse 93.  GENERAL: She is a well-developed, well-nourished black female, in no acute distress, alert and oriented x 3.  HEENT: Head normocephalic, atraumatic.  NECK: Supple without bruit or thyromegaly.  LUNGS: Clear with normal effort.  HEART: Regular rate and rhythm.  ABDOMEN: Soft, flat, with left CVA tenderness.  Multiple scars noted.  No mass, hepatosplenomegaly, or hernia noted.  GU/RECTAL: Done at the time of cystoscopy.  NEUROLOGIC: No focal deficits noted.  EXTREMITIES : Full range of motion without edema.  SKIN: Multiple surgical scars, but warm and dry.  LABORATORY DATA: CT scan revealed significant left hydronephrosis with multiple cysts and stones.  There is a large stone in the ureter.  There is mild right hydronephrosis and multiple cysts and stones.  Her blood work from Sep 25, 1999 showed CBC to have a hemoglobin of 10.5, hematocrit 32.4, with WBC of 12.7.  BUN was 44, creatinine 10.4.  IMPRESSION:  1. Bilateral renal stones with hydronephrosis, left greater than right,     with  left flank pain and fever.  2. End-stage renal disease.  3. Lupus.  4. Ulcerative colitis.  5. Steroid therapy.  6. Acquired cystic disease.  RECOMMENDATIONS:  1. Need cystoscopy, bilateral retrograde pyelogram, left and possible right     stent insertion.  This is scheduled for 9 a.m. tomorrow morning.  2. Long-term may be best served by bilateral nephrectomy.  She has minimal     urine output and significant stone disease. DD:  09/27/99 TD:  09/27/99 Job: [id number]

## 2010-09-24 NOTE — Consult Note (Signed)
NAME:  [name], [name]                        ACCOUNT NO.:  [id number]   MEDICAL RECORD NO.:  [id number]                   PATIENT TYPE:  INP   LOCATION:  5528                                 FACILITY:  [facility]   PHYSICIAN:  Ronald Lobo, M.D.                DATE OF BIRTH:  07-26-1940   DATE OF CONSULTATION:  08/08/2002  DATE OF DISCHARGE:                                   CONSULTATION   REASON FOR CONSULTATION:  Dr. Lorrene Reid asked [place] to see this 70 year old  African-American female because of diarrhea.   HISTORY OF PRESENT ILLNESS:  The patient was admitted to the hospital about  a week ago because of nausea, vomiting and diarrhea.   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 [name] showed a granuloma, it raised the question of  Crohn's colitis. In any event, the patient has been maintained on Asacol  nine tablets daily with good compliance and with good results.  Occasionally  she will use mezilamine suppositories or Cortifoam cream in the rectum to  help with any increase in symptoms, but by and large she has been  symptomatic with regular, well formed bowel movements perhaps once or twice  a day to my understanding.  Prior to admission, she developed loose, mucousy  stools, somewhat frequent in character, and in particular since admission  the diarrhea has been considerably worse.   Note that the patient is approximately three weeks status post a left  radical nephrectomy for renal cell carcinoma (according to the patient, the  patient apparently disputes this diagnosis, but I did not pursue that issue  during my consultation), and this was  complicated by an abscess requiring  tube drainage and so she has been on antibiotics and has had multiple CT  scans with oral contrast during the week in the hospital which is thought to  possibly be contributing to the diarrhea problem.  The patient has not had  any obvious increase in joint pains beyond her usual shoulder soreness and  hand soreness to suggest an extra intestinal flare of Crohn's colitis, and  she has only seen blood on one occasion (in the commode), no severe  abdominal cramps.   The patient estimates she has been having ten to 12 bowel movements a day  while in the hospital, but these have consisted mostly of small squirts.  There has been some nocturnal diarrhea.  The stools have ranged in character  from loose and mucousy to somewhat watery and this has not responded to  Imodium.   PAST MEDICAL HISTORY:  1. Renal cell cancer status post  nephrectomy.  2. Inflammatory bowel disease, probable Crohn's colitis.  3. Quiescent systemic lupus erythematosus.  4. Chronic pancreatitis of unknown cause on enzyme supplementation.  5. History of chronic anemia with  profound anemia during the time of     duodenal ulceration.  6. End-stage renal disease.   PHYSICAL EXAMINATION:  GENERAL:  The patient is up and around in the room,  appears chipper, lively and not at all weak or washed out.  HEENT: She is anicteric and without frank pallor.  CHEST:  Clear.  HEART:  Normal.  ABDOMEN:  Active bowel sounds and no overt tenderness.  She has a  percutaneous drain.   LABORATORY DATA:  Recent white count 8,000, hemoglobin 10.6, platelets  198,000.  Chemistry panel shows normal liver chemistries other than an  alkaline phosphatase of 200 and an albumin of 2.0.  Amylase was slightly  elevated on admission at 150, with a normal lipase.   Note also that the patient's Clostridium difficile has been tested as of two  days ago and is negative.   DISCUSSION AND PLAN:  Given the  negative Clostridium difficile and the  absence of severe abdominal pain, I suspect that the patient's diarrhea is  multifactorial, probably primarily related to her antibiotic therapy and/or  the oral contrast administered for her CT scans.  However, there is  certainly a possibility of some mild colitis activity contributing to her  current symptoms.  For now, I would continue current management including  Asacol therapy and p.r.n. Imodium.  However, the patient should have  sigmoidoscopic evaluation to look for pseudomembranes (a small fraction of  cases of Clostridium difficile colitis will have a negative Clostridium  difficile toxin assay) and to assess the activity of her Crohn's disease.  This will be scheduled for tomorrow.   We appreciate the opportunity to have seen this patient in consultation with  you.                                               Ronald Lobo, M.D.    RB/MEDQ  D:  08/08/2002  T:  08/10/2002  Job:  [id number]   cc:   Jeneen Rinks L. Rolla Flatten., M.D.  [address] [place]  [place]  [place] [address]  Fax: [place] Lorrene Reid, M.D.  [address]  [place]  [place] [address]  Fax: [phone]

## 2010-09-24 NOTE — H&P (Signed)
[place]. [facility]  Patient:    [name], [name]                     MRN: [id number] Adm. Date:  [id number] Attending:  Sherrin Daisy CC:         [facility]  Windy Kalata, M.D.  Orson Ape. [name], M.D.  Marshall Cork. Jeffie Pollock, M.D.   History and Physical  REASON FOR ADMISSION:  GI bleeding and hemodialysis.  Patient with ulcerative colitis and hemorrhoids, who is a Designer, television/film set.  PERTINENT HISTORY:  Ms. Pancoast is a woman who has chronic ulcerative colitis. This was diagnosed in February of this year.  The colon was normal from 60 cm above.  Biopsy confirmed ulcerative colitis in the left colon; in addition to this, she has had continued problems with hemorrhoids and was on a prednisone taper and things improved.  She continues to follow up with Dr. Orson Ape. [place] for her bleeding hemorrhoids.  We saw her in July with increased rectal bleeding.  At that time, she had bright red blood on the examining finger.  Stool was brown and formed and stool above the anal canal was heme-negative.  She had large prolapsing hemorrhoids.  She was referred to Dr. Jeanella Anton.  He apparently saw her in consultation and has arranged for her to have an elective hemorrhoidectomy under general anesthesia on her prolapsed hemorrhoids.  We saw the patient in the office two days ago.  She was having anal pain, continued bleeding, frequent stools but at that time, formed brown stool.  Examination showed large prolapsed hemorrhoids and anal fissure, bright blood in the anal canal but again brown soft stool.  Patient was continued on Kanasa suppositories, ProctoCream, Nupercainal, Sitz baths, called me over the weekend, noting that she is having continued and severe anal pain with continued bleeding to the point that she has stopped eating. At times, she now has just small amounts of bright red blood but does continue to have brown stool.   Due to the fact that she is [employer] and continue to bleed and has multiple other problems, it was felt that she needed admission, evaluation and some sort of surgical intervention on her hemorrhoids at this point in time.  CURRENT MEDICATIONS  1. Asacol 400 mg three t.i.d., recently changed to Colazal 750 mg three     t.i.d.  2. Nephro-Vite one q.d.  3. Procardia XL 60 mg q.d.  4. Toprol XL 50 mg q.d.  5. Os-Cal one t.i.d.  6. RenaGel two t.i.d.  7. Creon 20 mg q.i.d. a.c. and h.s.  8. Protonix one tablet q.d.  ALLERGIES:  Patient claims drug allergies to SEPTRA, TETRACYCLINE, ERYTHROMYCIN, AMOXICILLIN, CIPRO, GENTAMICIN as well as OTHER MEDICINES, THE NAMES OF WHICH SHE CANNOT REMEMBER.  MEDICAL HISTORY  1. Ulcerative colitis diagnosis, February 2001.  Biopsy documented disease to     60 cm.  2. Chronic pancreatitis, diagnosed by calcifications on CT scan, with     symptoms of vague upper GI discomfort.  The patient is a nondrinker.  The     etiology of this was never determined.  Due to multiple other problems, it     was felt to be somehow secondary to these.  She has clinically improved     dramatically on empiric enzyme supplementation.  3. Chronic problems with internal hemorrhoids, with large prolapsed     hemorrhoids.  4. End-stage renal disease, on  hemodialysis for some time, with a history of     previous peritoneal dialysis, complicated by fungal peritonitis.  5. History of duodenal ulcer, with acute operation for perforated ulcer in     the early 1990s, at which time she had a bleed down to a hemoglobin of     1.9.  6. History of diverticulitis, with emergency sigmoid resection, a diverting     colostomy by Dr. [name] subsequently taken down.  7. History of abdominal abscesses, with exploratory laparotomy, lysis of     adhesions and drainage of pelvic and abdominal abscesses, 1995, that were     felt to be related in some way to peritoneal dialysis.  8.  History of GI bleeding from anastomosis at her colostomy take-down site     that was controlled locally.  9. History of clotting abnormalities worked up extensively by     Dr. Vernell Morgans P. Livesay in the past, current information not available. 10. Status post cholecystectomy complicated by pancreatitis and lysis of     adhesions at that time. 11. History of multiple procedures for peritoneal dialysis and hemodialysis     access. 12. Chronic esophageal reflux disease. 13. History of bronchospasm.  FAMILY HISTORY:  Mother died of a ruptured aneurysm.  Father died in an accident.  There is no family history of Crohns, ulcerative colitis or any type of GI cancer.  SOCIAL HISTORY:  She is married, lives with her husband, does not smoke or drink and is a Designer, television/film set.  REVIEW OF SYSTEMS:  Patient does have a history of kidney stones, admitted with a urinary tract infection, despite the fact she makes no urine, and is undergoing ablation of kidney stones by Dr. Marshall Cork. [place].  Consideration has been given to a nephrectomy.  PHYSICAL EXAMINATION  VITAL SIGNS:  Patient is afebrile.  Pulse 76.  Blood pressure 143/86.  GENERAL:  A very pleasant black female in no acute distress.  EYES:  Sclerae nonicteric.  NECK:  Supple.  No lymphadenopathy.  LUNGS:  Clear.  HEART:  Regular rate and rhythm, without murmurs or gallops.  ABDOMEN:  Soft and completely nontender, with multiple surgical scars throughout the whole abdomen.  EXTREMITIES:  No evidence of edema.  RECTAL:  Examination of the perianal area reveals large prolapsing hemorrhoids with what appears to be either a fissure or tear in a hemorrhoid and is quite tender in the posterior position.  I was able to do a gentle digital exam.  There is bright blood in the anal canal and is exquisitely tender.  ASSESSMENT:  Continued rectal bleeding that has failed to resolve and, in fact, has worsened with conservative therapy.  I think  this woman clearly needs admission and attention to this now.  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  She could be bleeding either from her fissure, large prolapsed internal hemorrhoids or ulcerative proctitis.  Any of these are possible explanation or some combination of all of it.  PLAN:  1. Will admit and go ahead and have patient receive tap water enemas until     clear and performed a sigmoidoscopy to determine the chronic status of her     ulcerative colitis.  2. Will talk with Dr. [name] or his partners to see if the attention to     her hemorrhoids or anal fissure can be addressed now.  3.  Will involve the renal team with her care. DD:  12/19/99 TD:  12/20/99 Job: [id number]

## 2010-09-24 NOTE — Op Note (Signed)
[place]. [facility]  Patient:    [name], [name]                     MRN: [id number] Proc. Date: 06/01/00 Adm. Date:  [id number] Disc. Date: [id number] Attending:  Allyn Kenner CC:         Joyice Faster. Deterding, M.D.   Operative Report  CC# [id number]  PREOPERATIVE DIAGNOSIS:  Secondary hyperparathyroidism.  POSTOPERATIVE DIAGNOSIS:  Secondary hyperparathyroidism.  OPERATION:  Bilateral neck exploration, resection of three hyperplastic parathyroid glands and resection of the left lobe of the thyroid and autotransplantation of parathyroid tissue to the left forearm.  SURGEON:  Jaci Carrel, M.D.  ASSISTANT:  Earnstine Regal, M.D.  ANESTHESIA:  General endotracheal  DESCRIPTION OF PROCEDURE:  Under adequate general endotracheal anesthesia, the patients neck was extended over a shoulder roll and then prepared and draped in the usual fashion.  A low collar incision was made and carried down through the platysma muscle.  Superior and inferior platysmal flaps were developed and the Mahorners self retaining retractor was inserted into the neck.  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  The middle cervical fascia was incised in the midline.  The small veins overlying the strap muscles were divided over hemostats which were ligated with 4-0 silk.  The strap muscles were then divided using Bovie electrocoagulation, thereby giving access to the surgical plane of the thyroid.  The thyroid was also involved with multiple veins related to the aforementioned problem.  Multiple veins were divided over small hemoclips in order to mobilize the left lobe of the thyroid which was approached first. The left lobe of the thyroid was thoroughly explored and initially no parathyroid tissue was seen on the left side of the neck.  The  esophagus was exposed and also the cervical spine the paraspinal space and there was no evidence of any parathyroid tissue.  The anterior superior mediastinum was explored and once again no parathyroid tissue could be found.  The recurrent laryngeal nerve was identified and spared of any injury during all of this dissection.  Next, we explored the left carotid sheath and we did find an enlarged parathyroid gland in the carotid sheath up at the superior end of it. It measured 1.8 cm in length, 0.7 cm in width and 0.3 cm in thickness.  It was excised over small hemoclips.  A portion was sent for frozen section study and the remainder was saved in iced saline.  The frozen section analysis confirmed parathyroid tissue, so at this point, we had found one parathyroid gland on the left side that was ectopic to the left carotid sheath.  We then explored the right side of the neck and found two enlarged parathyroid glands in their relatively normal positions.  The right superior was 2 cm in length, 0.8 cm in width and 0.4 cm in thickness and it was dissected out and excised over small hemoclips and a portion was sent for frozen section study which confirmed parathyroid tissue and the remainder was saved in iced saline.  The right inferior gland was absolutely in its normal position and it was 1.5 cm in length, 1.0 cm in width and 0.5 cm in thickness.  It was dissected out by blunt and sharp dissection and small clips were used and then portion was sent for frozen section study, the  remainder was saved in iced saline.  At this point, we had found three parathyroid glands, one on the left and two on the right.  We then went back to the left side and explored the area once again and found no other parathyroid tissue.  At this point, we went ahead and did a left thyroid lobectomy to protect against an intrathyroidal parathyroid gland on the left side.  The superior pole was dissected out and controlled  with ligatures of 2-0 silk and a clip left on the stay side.  The recurrent laryngeal nerve was kept in view at all times and the inferior thyroid artery branches were divided over small clips.  The inferior pole attachments were divided over hemostasis which were ligated with 4-0 silk.  The left lobe of the thyroid was completely dissected off of the trachea and then divided across the isthmus over a hemostat which was sutured ligated with 4-0 Vicryl. Specimen was removed from the operative field and sent for pathologic study but no parathyroid tissue was found within it.  At this point, we felt that we had done a full exploration of the left side of the neck and found a total of three parathyroid glands overall that were hyperplastic and for this reason terminated the procedure at this time with the thought that this patient may have only three parathyroid glands as is the case in approximately 7% of the population.  The strap muscles were repaired with interrupted sutures of 4-0 Vicryl.  The platysma layer was also repaired in a similar manner and the skin of the neck was closed with the generic skin stapler 35-W.  A sterile dressing was applied.  The left forearm was then prepared and draped in the usual fashion for autotransplantation.  A portion of the right superior gland was diced into 12 small pieces approximately 2 mm in diameter.  A vertical incision was made overlying the left brachioradialis muscle and carried down to the fascia. Next, an area 5 cm x 5 cm was cleared overlying the fascia and the skin was retracted with Weitlaner retractor.  The 12 small pieces of parathyroid tissue were then autotransplanted into the brachioradialis muscle each into an individual pocket and each pocket was closed individually with a suture of 4-0 Prolene.  Hemostasis was ascertained.  The skin was reapproximated with the generic skin stapler 35-W and a sterile Ace wrap dressing was placed on  the left forearm.  The patient tolerated the procedure well and left the operating room in satisfactory condition. DD:  06/01/00  TD:  06/02/00 Job: [id number]

## 2010-09-24 NOTE — Assessment & Plan Note (Signed)
[facility]                             PULMONARY OFFICE NOTE   [name], WUEST                      MRN:          [id number]  DATE:09/13/2006                            DOB:          13-Jul-1940    PROBLEM:  This is a 70 year old female seen in new office evaluation to  establish for care of sleep apnea. I had seen her 15 or 20 years ago and  we had diagnosed sleep apnea and begun CPAP with [facility].   HISTORY:  She says that she cannot do without her CPAP. According to  [facility]'s records, the pressure is set at 6 CWP. Sometimes that pressure  may seem a little low to her, but she is not aware of daytime sleepiness  or snoring and there have been no acute problems. Typical bedtime is  10pm estimating 20-30 minutes for sleep onset waking several times  briefly during the night before finally waking up at 6:30 or 7am.   CURRENT MEDICATIONS:  1. Aranesp injection.  2. Nephrovite.  3. Prograf.  4. Myfortic.  5. Protonix.  6. Toprol.  7. Procardia.  8. Lasix.  9. Hectorol.  10.Lasitita.  11.Ambien 1/2 x5 mg.  12.MiraLax.  13.Asacol.  14.Canasa suppositories.  15.CPAP at 6 CWP.  Drug intolerant of SEPTRA, CIPRO, TETRACYCLINE, ERYTHROMYCIN,  AMOXICILLIN/AMPICILLIN.   REVIEW OF SYSTEMS:  She is aware of snoring and some difficulty with  sleep onset. 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 Occasionally difficult to  maintain daytime sleepiness. Weight has gradually up about 20 pounds  over the last 2 years. There is no cough or wheeze, sore throat, or  difficulty swallowing. There is no significant difficulty breathing  through her nose.   PAST HISTORY:  Chronic renal failure attributed to Lupus with kidney  transplant in 2006 at [facility]. She is followed primarily now by Dr.  Jeneen Rinks Deterding. Arterial blood clot in access site, hypertension,  parathyroidectomy, sleep apnea, hemorrhoidectomy. See charted list of  additional procedures.   SOCIAL  HISTORY:  Non-smoker, married, not employed.   FAMILY HISTORY:  She lists her own health problems without indicating  problems with others in the family.   OBJECTIVE:  VITAL SIGNS:  Weight 140 pounds, blood pressure 148/72,  pulse regular 73, room air saturation 100%.  GENERAL:  This a trim, alert, and pleasant woman.  HEENT:  Palette spacing 3-4/4, voice quality is normal, she is breathing  through her nose, no strider or thyromegaly.  CHEST:  Quiet, clear lung fields. No cough or wheeze.  HEART:  Sounds are regular without murmur, rub, or gallop.  EXTREMITIES:  No edema or cyanosis.   IMPRESSION:  Obstructive sleep apnea with satisfactory control at 6 CWP.  We discussed options including treatment options, mask, and machine  changes and we reviewed the physiology of sleep apnea. She wanted to  establish for long term care, but has no specific needs identified at  this time.   PLAN:  We are establishing with [facility] for contact needs. Schedule return  in one year, earlier p.r.n.     Clinton D. Young,  MD, Shade Flood, FACP  Electronically Signed    CDY/MedQ  DD: 09/16/2006  DT: 09/17/2006  Job #: [id number]

## 2010-09-24 NOTE — Discharge Summary (Signed)
NAME:  [name], [name]                        ACCOUNT NO.:  [id number]   MEDICAL RECORD NO.:  [id number]                   PATIENT TYPE:  INP   LOCATION:  5528                                 FACILITY:  [place]   PHYSICIAN:  Louis Meckel, M.D.        DATE OF BIRTH:  [birth year]   DATE OF ADMISSION:  08/01/2002  DATE OF DISCHARGE:  08/15/2002                                 DISCHARGE SUMMARY   DISCHARGE DIAGNOSIS:  Retroperitoneal abscess status post left nephrectomy  on 07/18/2002.   HISTORY OF PRESENT ILLNESS:  This is a 70 year old black female who was  admitted complaining of abdominal pain, nausea, vomiting, anorexia, and  fever for two days on 08/01/2002.  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  She is a patient of Caren Griffins B. Lorrene Reid, M.D., and has  been seen by [facility] during her stay at [facility].  She was found to have a retroperitoneal abscess status  post left nephrectomy.  This abscess was found by CT.  A drain was placed  which revealed bloody pus drainage.  The patient was begun on antibiotics,  vancomycin, and Fortaz.  She was found  to have a __________ and blood  cultures were obtained at that time.  She was admitted to 5500 per Maudie Flakes. Hassell Done, M.D., for further evaluation and drainage of the abscess.  Four  different CTs were done revealing possibly multi-loculated area of infection  beneath the area of infection where the drain was placed.  This was also  confirmed by an ultrasound, at which point Dr. Mellody Drown office was  contacted for transfer to [facility] for further  evaluation and possible surgical intervention.   MEDICATIONS:  1. Pancrease one three times daily with meals.  2. Nephro-Vite one tablet every day.  3. Epogen 25,000 units at hemodialysis on Monday, Wednesday, and Friday.  4. Zemplar 5 mcg at hemodialysis on Monday, Wednesday, and Friday.  5. Protonix 40 mg at bedtime.  6. Procardia 60 mg at bedtime.  7. Toprol XL 100 mg at bedtime.  8. Altace 5 mg by mouth twice daily.  9. Asacol 400 mg four tablets three times daily.  10.      PhosLo 667 mg three times daily with meals.  11.      Questran 4 g.  12.      Phenergan 12.5 mg q.4-6h.  13.      Xanax 0.25 mg daily.  14.      Fortaz 1 g IV daily.  15.      Iron dextran 100 mg at hemodialysis on Monday, Wednesday, and     Friday.  16.      Vancomycin 750 mg at hemodialysis.  17.      Rowasa 500 mg suppository three times a  day as needed.  18.      Imodium 2 mg every day.  19.      Restoril 15-30 mg at bedtime as needed.  20.      Aluminum hydroxide suppositories 15-30 mL p.r.n.   ALLERGIES:  ERYTHROMYCIN, TETRACYCLINE, AMOXICILLIN, CIPRO, PENICILLIN, and  TMP.   PROCEDURES PERFORMED:  1. On 08/02/2002, CT-guided drain placement.  Please note that a total of     approximately 300 mL was drained.  2. On 08/05/2002, the drain was repositioned and pulled back 3-4 cm.  3. On 08/07/2002, CT revealing decrease in size of the abscess and a second     fluid below the original abscess, maybe loculated.  4. On 08/12/2002, CT showing a small decrease in size of abscess with     residual collection.  5. On 08/13/2002, an abdominal ultrasound confirmed multi-loculation of     pockets.  6. On 08/09/2002, the patient underwent a flexible sigmoidoscopy revealing no     active Crohn's disease.   HOSPITAL COURSE:  #1 - RETROPERITONEAL ABSCESS STATUS POST LEFT NEPHRECTOMY:  She was placed on vancomycin and Fortaz.  She received a CT-guided drain  which was then repositioned and where a second pocket of infection was  found.  Upon further consultation with the radiologist, further drain  placement was not recommended due to multiple septations.  Dr. Mellody Drown  office was contacted for transfer.   #2 - DIARRHEA:  During  her hospital course, she has experienced an increase  in loose stools and diarrhea, which was thought originally to be an  exacerbation of her Crohn's disease.  She is followed by Jeneen Rinks L. Oletta Lamas,  M.D., for same.  She had a flexible sigmoidoscopy on 08/09/2002, revealing no  inflammation.  She was placed on Questran at that time to aid with increased  diarrhea.   #3 - NUTRITIONAL STATUS AND ANOREXIA:  We liberated her diet and used  Phenergan.  The patient has been able to tolerate her medicines.  She seems  to be somewhat picky about the food that she is eating.  The last albumin  was 1.8.   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  The last  hemodialysis was on 08/14/2002.  Orders were __________ 200 NR, dialyze for  three hours to an estimated dry weight of 49 kg, 400/800 using the 3.7 K  potassium bath, tight heparin, 148 linear sodium.  She received 25,000 units  of Epogen and 100 mg of iron dextran.   #5 - ANEMIA:  She received Epogen.  She is also a Jehovah's Witness and  refuses all blood products.   #6 - CROHN'S DISEASE:  Treated with Questran by Jeneen Rinks L. Oletta Lamas, M.D.   #7 - CHRONIC PANCREATITIS:  Takes Pancrease for this.   #8 - SECONDARY HYPERPARATHYROIDISM.   #9 - HYPOTENSION:  Currently managed with Altace, metoprolol, and Procardia.   DISPOSITION:  She is being discharged to [facility] to the care of Dr.  Lawernce Pitts.     Rudene Re, P.A. Student                  Louis Meckel, M.D.    ER/MEDQ  D:  08/14/2002  T:  08/14/2002  Job:  [id number]

## 2010-09-24 NOTE — H&P (Signed)
[place]  Patient:    [name], [name]                     MRN: [id number] Adm. Date:  [id number] Disc. Date: [id number] Attending:  Allyn Kenner CC:         [facility]   History and Physical  ADMISSION DIAGNOSIS: Sepsis.  HISTORY OF PRESENT ILLNESS: [name] is a 70 year old female with end-stage renal disease secondary to systemic lupus erythematosus, who has been on hemodialysis for a prolonged period.  She has a history of multiple medical problems including her end-stage renal disease, secondary hyperparathyroidism which is recurrent - status post parathyroidectomy in January 2002, history of DVT, history of anemia (is Neurosurgeon Witness), history of ulcerative colitis, history of hemorrhoids, history of hypertension, history of chronic pancreatitis with malabsorption, history of renal stone disease, history of diverticulosis with bleeding, history of cholecystitis.  Her pertinent recent history is that went to [place], [place] to visit for 12 days on August 27, 2000 and apparently she developed a Bells palsy prompting upon arriving, and was given ten days of 80 mg prednisone and Famvir.  On Sep 07, 2000 she started having chills and was worse on dialysis on Sep 08, 2000.  She returned home on Sep 09, 2000 and was chilling all day Saturday and Sunday, and today was chilling on dialysis but apparently did not have a fever, but was given antibiotics after cultures.  She was given tobramycin and vancomycin.  At home her temperature was 103 degrees and she felt miserable, so she was asked to come to the emergency room.  She has come in complaining of just feeling miserable and aching, with no energy.  She had to get in a wheelchair today because of weakness.  She has had a cough for five days with grayish phlegm. Sore throat, but no earache, nausea, vomiting, diarrhea, rash, or arthralgias. No new medications other than what  she was on before.  CURRENT MEDICATIONS:  1. Toprol XL 50 mg q.d.  2. Altace 2.5 mg q.d.  3. Procardia XL 60 mg q.d.  4. Nephro-Vite q.d.  5. Protonix 40 mg at bedtime.  6. Rowasa suppository one p.r.n. b.i.d.  7. Calcium tablets, four with meals.  8. Asacol 400 mg t.i.d.  9. Pancrease one with meals. 10. Calcijex 2 mcg with dialysis. 11. Epogen 18,000 units with dialysis.  PAST MEDICAL HISTORY:  1. Illnesses listed above.  2. History of GI bleeding from anastomosis of colostomy with takedown.  3. Past history of clotting abnormalities in the past.  4. 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  5. History of esophageal reflux.  6. Bronchospasm.  7. Duodenal ulcer with acute perforation in the early 1990s.  8. History of internal hemorrhoids.  9. Biopsy documented ulcerative colitis.  PAST SURGICAL HISTORY:  1. Hemorrhoidectomy.  2. Multiple access procedures in both upper extremities.  3. History of diverticulosis with emergent sigmoid resection during     colostomy and subsequent takedown.  4. History of abdominal abscess with laparotomy.  5. History of multiple peritoneal dialysis and hemodialysis access.  6. Internal hemorrhoids operated on in the past.  ALLERGIES:  1. SEPTRA.  2. TETRACYCLINE.  3. ERYTHROMYCIN.  4. AMOXICILLIN.  5. CIPRO.  6. ANCEF.  She tolerates vancomycin and tobramycin, however.  SOCIAL HISTORY: She is currently disabled.  Married, lives  with husband.  She has one child.  Does not smoke or drink.  Jehovahs Witness.  FAMILY HISTORY: Mother died of ruptured aneurysm in her 27s.  Father died in an accident in his 62s.  REVIEW OF SYSTEMS: HEENT: She has a sore throat.  Achiness but no specific pain in sinuses.  She notes facial swelling, worse on the right than on the left, which has been a problem since her Bells palsy.  NECK: Unremarkable. LUNGS: She has had cough as noted above.  No  history of asthma, hay fever, or smoking.  CARDIOVASCULAR: No edema.  Sleeps on one pillow.  No orthopnea.  GI: No nausea, vomiting, diarrhea.  No history of hepatitis or jaundice.  No bloody or black stools.  SKIN: No rash.  MUSCULOSKELETAL: Aches in hands and legs, generalized aching.  No focal joint pain. NEUROLOGIC: No significant focal symptoms with headaches, sensory, or motor symptoms.  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  PHYSICAL EXAMINATION:  GENERAL: She looks miserable and it is obvious she has Bells palsy on the right with right facial swelling.  VITAL SIGNS: Blood pressure 91/56, temperature 103.8 degrees, heart rate 90, respiratory rate 16.  HEENT: Bells palsy.  Pharynx and ears unremarkable.  Fundi show no abnormalities.  NECK: Some shotty anterior and posterior cervical adenopathy, worse on the right, and also parotid swelling on the right.  CARDIOVASCULAR: Regular rhythm, S4, grade 2/6 holosystolic murmur at the apex. PMI ______ fifth intercostal space.  Hyperdynamic precordium.  ______ pedal pulses 1+.  LUNGS: Scattered rhonchi and bronchial breath sounds.  No focal abnormalities.  ABDOMEN: Positive bowel sounds.  Liver down 2 cm.  No splenomegaly.  Multiple scars about the abdomen.  SKIN: No rash.  Multiple scars on arms and abdomen.  No erythema.  She has a catheter in the right subclavian vein.  NEUROLOGIC: Cranial nerves 2-12 show a right peripheral seventh.  She has facial edema on that side also.  Motor 4/5 and symmetric.  Deep tendon reflexes are 1+/4+ in Achilles and patella, 2+ in upper extremities.  Toes are downgoing.  Rapid alternating movements intact.  LABORATORY DATA: Sodium 138, potassium 3.1, chloride 100, bicarbonate 25, glucose 99, BUN 49, creatinine 8.4.  Calcium 6.6, total protein 6.2, albumin 2.9.  SGOT elevated at 320, SGPT elevated at 251.  Alkaline phosphatase 482. Bilirubin 1.  ASSESSMENT:  1. Fever, probable sepsis; rule out catheter  related; rule out pneumonia.      Relative low blood pressure.  Will monitor that very closely.  2. End-stage renal disease.  She had hemodialysis today.  Potassium still a     little low.  She could have a left forearm arteriovenous graft and need to     consider.  If she has a catheter infection may need catheter pulled.  3. Anemia.  She is a Designer, television/film set.  This has been a recurrent problem     for her and she had bone marrow problems with both lupus and suppression     from medication as well as bleeding.  4. Hypertension.  Now blood pressure is decreased.  Will hold medications     and give her some volume.  5. Immunocompromise, recent prednisone.  6. Multiple drug allergies.  7. Ulcerative colitis, inactive.  8. Secondary hyperparathyroidism.  PLAN:  1. Cultures.  2. IV antibiotics.  3. Chest x-ray.  4. Laboratories.  5. If cultures are positive with negative chest x-ray remove Perma-Cath.  ADDENDUM: She has increased LFTs that were  not present on laboratories in early April 2002.  This could be related to low blood pressure, sepsis, viral infection, or Famvir. DD:  09/11/00 TD:  09/12/00 Job: [id number]

## 2010-09-24 NOTE — H&P (Signed)
NAME:  [name], [name]                        ACCOUNT NO.:  [id number]   MEDICAL RECORD NO.:  [id number]                   PATIENT TYPE:  INP   LOCATION:  1823                                 FACILITY:  [facility]   PHYSICIAN:  Donato Heinz, M.D.             DATE OF BIRTH:  Sep 27, 1940   DATE OF ADMISSION:  10/22/2002  DATE OF DISCHARGE:                                HISTORY & PHYSICAL   CHIEF COMPLAINT:  1. Fevers.  2. Chills.  3. Malaise.   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  She reports that Monday after her  dialysis, she just felt run down, had an odd episode of diarrhea that was  unlike her normal Crohn's flare, and then today she had a fever that spiked  to 102.9 and had shaking chills, could not become warm and called the  dialysis unit and was instructed to come to the emergency room for further  evaluation.  She reports generalized malaise, fatigue, dyspnea on exertion  but no productive cough and no hemoptysis.  No dysuria, pyuria, hematuria.  No new hematochezia, melena or bright red blood per rectum.  No abdominal  pains.   ALLERGIES:  PENICILLIN, SULFA, ERYTHROMYCIN, TETRACYCLINE and also CIPRO.   PAST MEDICAL HISTORY:  As per HPI -  1. End-stage renal disease, on hemodialysis every Monday, Wednesday and     Friday at [facility].  2. Lupus.  3. Crohn's disease.  4. Hypotension.  5. Anemia.  6. Secondary hyperparathyroidism.  7. Chronic pancreatitis.  8. Bilateral renal cell carcinoma, status post radical  left nephrectomy     complicated by retroperitoneal abscess requiring drainage and     antibiotics.  Right kidney with a lesion suspicious for renal cell and is     due for radical nephrectomy at [facility].  9. Perforated duodenal ulcer.   MEDICATIONS:  1. Nephro-Vite 1 per day.  2. PhosLo 667  one before every meal  3. Asacol 400 mg four times daily.  4. Pancrease 1 before every meal and at bedtime.  5. Protonix 40 mg daily.  6. Procardia XL 60 mg at bedtime.  7. Altace 2.5 mg at bedtime.  8. Toprol 100 mg at bedtime.  9. Darvocet-N 100 p.r.n.  10.      Cortifoam rectal application at bedtime.  11.      Canasa suppositories 1 at bedtime.  12.      Megace 1 twice a day.   FAMILY HISTORY:  Noncontributory.   SOCIAL HISTORY:  She lives at home with her husband.  No tobacco or alcohol.  Nobody else is sick at home.  No recent travel.   REVIEW OF SYSTEMS:  GENERAL:  The patient felt malaise, fatigue, anorexia,  fevers and chills, as per HPI.  OPHTHALMIC:  Denies any blurred vision,  photophobia.  ENT:  Denies any dysphagia, odynophagia.  No oral lesions.  CARDIAC:  Denies any chest pain, palpitations, orthopnea, PND.  PULMONARY:  Does have some dyspnea on exertion.  No hemoptysis.  No productive cough.  GASTROINTESTINAL:  Denies any nausea, vomiting, hematochezia, melena or  bright red blood per rectum.  Did have one episode of diarrhea with a little  bit of  blood in there that is normal for her Crohn's.  GENITOURINARY:  No  dysuria, pyuria, hematuria, urgency, frequency or retention.  HEMATOLOGIC:  Denies any abnormal bleeding or bruising.  IMMUNOLOGIC:  Denies any swollen,  painful joints.  No arthralgias, myalgias.  DERMATOLOGIC:  No rashes, lumps  or bumps.  NEUROLOGIC:  No numbness, tingling but does have generalized  weakness.  No ataxia or aphasia.  All other systems negative.   PHYSICAL EXAMINATION:  GENERAL:  This is a  well-developed, frail-appearing  female who looks  chronically ill but in no apparent distress.  VITAL SIGNS:  Temperature 102.4, pulse 124, blood pressure 185/89,  respiratory rate 22, pulse oximetry 91% on room air.  HEENT:  Head normocephalic, atraumatic.  Pupils equal, round and reactive to  light.  Extraocular muscles intact.  No icterus.  Oropharynx showed dry  mucous membranes.  NECK:  Supple with full range of motion.  She had some shotty  lymphadenopathy.  No bruits appreciated.  LUNGS:  She had bibasilar crackles left greater than right.  No dullness to  percussion.  CARDIAC:  Tachycardic at 100.4.  No rub.  ABDOMEN:  Normoactive bowel sounds, soft, nontender, nondistended.  EXTREMITIES:  She had trace to 1+ pretibial edema bilateral lower  extremities.  She has an AV graft in her left thigh which has a 1 x 1.5-cm  aneurysm.  There is no erythema.  No induration.  No tenderness.   LABORATORY:  White blood cell count 16.7, hemoglobin 10.4, platelets 205.  Sodium 136, potassium 4, chloride 97, CO2 of 32, BUN 18, creatinine 5,  glucose 79, albumin 1.8, calcium 8.1, alkaline phosphatase 139, total  bilirubin 0.7, AST 17, ALT 10, total protein 6.7.  Chest x-ray showed left  lower lobe infiltrate versus atelectasis.  There are some increased lung  markings compared to March.  The left lower lobe infiltrate was not there  when compared to the March 2004 chest x-ray.   ASSESSMENT/PLAN:  1. Fevers and chills.  The patient now has hypoxia, tachycardia and a chest     x-ray consistent with pneumonia.  Will plan to admit her for IV     antibiotics to treat community-acquired pneumonia and supplement her with     oxygen 2 L via nasal cannula.  The patient was given Rocephin 1 gm IV in     the ER.  Will also add azithromycin 500 mg IV to cover atypicals.  She     reports that she has taken this medication before in the past without any     allergies. 2. Tachycardia.  Again, related to number one; however, we will give her,     her  Procardia and metoprolol at this time and follow her blood pressure     and heart rate.  3. End-stage renal disease.  Will plan for hemodialysis tomorrow with some     ultrafiltration to see if that will also help with her hypoxia and     continue to follow.  4. Hypertension.  Continue with blood pressure medication.  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  Will have CVTS evaluate in the morning.  6. Lupus.  Continue to follow.  Continue with Asacol and Canasa.  7. Chronic pancreatitis.  Will continue with Pancrease.  8. Secondary hyperparathyroidism.  Will continue with Zemplar and PhosLo.                                               Donato Heinz, M.D.    JC/MEDQ  D:  10/22/2002  T:  10/22/2002  Job:  [id number]

## 2010-09-24 NOTE — H&P (Signed)
[place]. [facility]  Patient:    [name], [name]                       MRN: [id number] Adm. Date:  [id number] Attending:  Sherrin Daisy CC:         Windy Kalata, M.D.             [facility]                         History and Physical  REASON FOR ADMISSION:  Hemodialysis patient, Raymond Gurney Witness, with ulcerative colitis with steroid taper with increasing abdominal pain, anorexia, and fever.  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  She has been on dialysis for some time.  She was hospitalized back in February following a colonoscopy which showed severe ulcerative colitis to 60 cm.  The colon was normal from 60 cm on above. Biopsies confirmed ulcerative colitis.  We have been seeing the patient in the office and following her.  She remains on Asacol three tablets t.i.d.  She is begin slowly tapered off her prednisone.  She has been on a prednisone taper and was last seen by myself in the office on Sep 14, 1999.  At that time she was feeling well. She is down to 5 mg of prednisone every other day.  Her stool was brown and semiformed and heme-negative.  The plan was to try to taper her off her prednisone completely.  She has had a small-bowel series done showing no evidence of Crohns disease.  The patient has been anorexic and felt poorly for the past several days.   She has had the abdominal pain and general malaise. The pain is in her upper abdomen and is worsened with eating.  She has not seen any gross blood in her stools but has continued to have diarrhea which has gotten progressively worse since she has tapered off her steroids.  With this history, she came to the emergency room.  CURRENT MEDICATIONS: 1. Prednisone 5 mg q. o.d. 2. Asacol 400 three tablets t.i.d. 3. Procardia XL 60 mg q.d. 4. Toprol XL 50 mg q.d. 5. Os-Cal daily. 6. Renagel three tablets  daily. 7. Restoril. 8. Combivent inhaler two puffs q.i.d. 9. Xanax p.r.n. for anxiety.  ALLERGIES:  SEPTRA, ERYTHROMYCIN, TETRACYCLINE, PENICILLIN, CIPRO, LABETALOL.  MEDICAL HISTORY: 1. End-stage renal disease on dialysis for some time.  Initially, was on    peritoneal dialysis, developed fungal peritonitis in February 1995 and had    to be hospitalized. 2. History of duodenal ulcer, status post perforated ulcer, operated on in the    past by Dr. Janan Halter and Dr. [name] with bleed down to hemoglobin    1.9 from which she recovered. 3. History of abdominal abscesses with exploratory laparotomy and lysis of    adhesions, drainage of pelvic abdominal abscesses in 1995 that were felt    to be dialysis related by Dr. [name]. 4. History of diverticulitis with emergency sigmoid resection by Dr. [name]    with averting colostomy subsequently taken down. 5. History of GI bleeding from anastomosis that was controlled locally. 6. History of extensive work-up by Dr. Marko Plume due to clotting abnormalities. 7. Status post cholecystectomy that was associated with pancreatitis and lysis    of adhesions at that time. 8. Multiple procedures for peritoneal dialysis and hemodialysis access.  9. History of chronic esophageal reflux disease.  FAMILY HISTORY:  Her mother died of a ruptured aneurysm, her father died in an accident.  No family history of colon cancer or inflammatory bowel disease.  SOCIAL HISTORY:  She is married and lives with her husband.  She does not smoke or drink.  She is a Designer, television/film set.  PHYSICAL EXAMINATION:  VITAL SIGNS:  Temperature 101.2, blood pressure 169/76, pulse 96, respiration 22.  GENERAL:  She is a pleasant black female in no severe distress.  HEENT:  Eyes clear, non-icteric.  Extraocular movements intact.  HEART:  Regular rate and rhythm without murmurs or gallops.  LUNGS:  Clear.  ABDOMEN:  Soft with good bowel sounds.  There is tenderness  primarily in the left upper quadrant.  No guarding, rigidity or rebound.  RECTAL EXAM:  No performed.  She does have a history of anal fissure with bleeding during exams and she does not claim to be bleeding now.  I deferred exam at this time.  EXTREMITIES:  Trace edema.  PERTINENT LABORATORY DATA:  Hemoglobin 10.8 which is fairly good for her with a white count of 14.6.  Potassium 3.2, albumin 3.0, total bilirubin 0.4, amylase 239, lipase 72.  ASSESSMENT:  Abdominal pain and fever - source of this is unclear.  It seems to be more upper abdominal pain rather than lower.  Need to be concerned about pancreatitis, ulcer, etc.  There is no clinical signs of bleeding currently. This all could be due to her ulcerative colitis.  PLAN: 1. We will admit to the floor. She has been given antibiotics by    Dr. Mercy Moore, specifically Vancomycin.  She has also been given    gentamicin.  Hemodialysis is ordered for tomorrow with tight heparin. 2. Will keep her on renal failure clear liquids. 3. Will obtain a CT of her abdomen to look for signs of pancreatitis or an    abscess. 4. We will go ahead and increase her prednisone to 20 mg b.i.d. for a short    period of time until this is all sorted out. 5. Will continue to follow her along with the renal service. DD:  09/24/99 TD:  09/25/99 Job: [id number]

## 2010-09-24 NOTE — Discharge Summary (Signed)
NAME:  [name], [name]                        ACCOUNT NO.:  [id number]   MEDICAL RECORD NO.:  [id number]                   PATIENT TYPE:  INP   LOCATION:  6735                                 FACILITY:  [facility]   PHYSICIAN:  Donato Heinz, M.D.             DATE OF BIRTH:  [birth year]   DATE OF ADMISSION:  10/22/2002  DATE OF DISCHARGE:  11/01/2002                                 DISCHARGE SUMMARY   ADMITTING DIAGNOSES:  1. Fever and chills, rule out sepsis.  2. End-stage renal disease on chronic hemodialysis.  3. Hypertension.  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  5. Systemic lupus erythematosus.  6. Chronic pancreatitis.  7. Secondary hyperparathyroidism.  8. Status post left radical nephrectomy complicated by retroperitoneal     abscess due to bilateral renal cell carcinoma.  9. Crohn's disease.   DISCHARGE DIAGNOSES:  1. Vancomycin-resistant Enterococcus left retroperitoneal abscess status     post drainage.  2. End-stage renal disease on chronic hemodialysis.  3. Crohn's disease.  4. Hypertension.  5. 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  6. Secondary hyperparathyroidism.  7. Systemic lupus erythematosus.  8. Loss of appetite of chronic disease.   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  She presents to the emergency room complaining of  fatigue, dyspnea on exertion, nonproductive cough, no hemoptysis, dysuria,  pyuria, or hematuria.  She denies hematochezia, melena, or bright red blood  per rectum nor does she have any abdominal pain.   LABORATORIES ON ADMISSION:  White count 16,700, hemoglobin 10.4, platelets  205,000.  Sodium 136,  potassium 4, chloride 97, CO2 32, BUN 18, creatinine  5, glucose 79, albumin 1.8, calcium 8.1, alkaline phosphatase 139, total  bilirubin 0.7.  LFTs within normal limits.  Chest x-ray shows left lower  lobe infiltrate versus atelectasis.   HOSPITAL COURSE:  Blood cultures were obtained and she was empirically  placed on Rocephin and azithromycin IV due to her multiple antibiotic  allergies.  A CT scan of the abdomen showed there to be a small central  fluid collection noted in the left retroperitoneum  which may represent  abscess.  It measured 3.2 x 1.5 cm.  The remainder of the tissue appeared to  be phlegmonous inflammation without well-defined fluid.  She had mildly  enlarged perirectal lymph nodes and there was third spacing of fluid seen.  She was status post cholecystectomy.  Bilateral pleural effusions, left  greater than right, were seen with compressive atelectasis in both lung  bases.  Despite antibiotic therapy she continued to have fever spikes as  high as 101-102.  Vancomycin was added empirically.  Blood cultures  remained  negative.  She underwent a CT-guided drainage of the abscess.  Only a few  drops of a bloody turbid fluid were obtained.  This ultimately grew out  vancomycin-resistant Enterococcus.  At this point azithromycin, Rocephin,  and vancomycin were all stopped.  Zyvox was started IV.  At time of  discharge she was switched to 600 mg orally b.i.d. for three more weeks to  complete a 4-week course of antibiotics.  At the end of her antibiotic  therapy she will have a repeat CT of the abdomen with oral contrast to  follow up the drained abscess.  This was arranged for November 21, 2002.  Her  fevers defervesced, her white count returned to within normal limits, and  she was feeling better.   Despite clearing of infection she remained anorexic.  She had no desire to  eat although denied nausea.  Megace was started as an appetite stimulant.  Her diet was liberalized to  only restrict potassium.  Hopefully upon  discharge this will improve with home cooking.  Lyn Hollingshead remains the issue of  her right renal mass.  She will need a right nephrectomy once the patient is  stabilized and she is considering having this done in [place] in the  future.   DISCHARGE MEDICATIONS:  1. Zyvox 600 mg b.i.d. for 3 more weeks.  2. Nephro-Vite vitamin one daily.  3. PhosLo 667 mg one with each meal.  4. Pancreas one with each meal and bedtime.  5. Protonix 40 mg at bedtime.  6. Procardia 60 mg at bedtime.  7. Altace 2.5 mg at bedtime.  8. Toprol XL 100 mg at bedtime.  9. Megace 200 mg b.i.d.  10.      InFeD 100 mg IV each dialysis x5 then 50 mg IV weekly.  11.      EPO 30,000 units IV each dialysis.   NEW DRY WEIGHT:  43.5 kg   DIALYSIS:  Every Monday, Wednesday, Friday at the [facility].   FOLLOW UP:  CT of the abdomen November 21, 2002.      Nonah Mattes, P.A.                      Donato Heinz, M.D.    RRK/MEDQ  D:  12/15/2002  T:  12/15/2002  Job:  [id number]   cc:   [facility]

## 2010-09-24 NOTE — Op Note (Signed)
[place]. [facility]  Patient:    [name], [name] Visit Number: [id number] MRN: [id number]          Service Type: DSU Location: [phone] Attending Physician:  Myrtie Cruise Dictated by:   Orson Ape. [name], M.D. Proc. Date: 03/10/01 Admit Date:  03/10/2001 Discharge Date: 03/11/2001                             Operative Report  PREOPERATIVE DIAGNOSES: 1. Severe anal pain, spasm, and thrombosed external hemorrhoids. 2. History of ulcerative proctitis. 3. Chronic renal failure.  OPERATION:  Examination under general anesthesia, anoscopic and excision of thrombosed external hemorrhoids, and sphincterotomy.  ANESTHESIA:  General anesthesia in a lithotomy position.  HISTORY OF PRESENT ILLNESS:  [name] is a 69 year old female who I have known for years with multiple problems related predominantly to her renal failure.  She has had a perforated sigmoid colon from diverticulitis and had a colostomy and then a colostomy take down approximately two years ago.  Since then, she has had recurrent problems with ulcerative proctitis, severe hemorrhoids, had a hemorrhoidectomy approximately 1-1/2 years ago, and did better initially, but then over the last two weeks she has had problems with severe anal pain.  I saw her in the office yesterday.  Friday is her dialysis day.  She had hemodialysis shortly before, but on examination she had a very tight anus with an area anteriorly that was small, it looked like a thrombosed external hemorrhoid, and then a pretty significant quadrant to the right posterior that looked like probably a thrombosed external hemorrhoid, but possibly an underlying infection.  On trying to do a rectal examination, she has severe spasm sort of like a stricture, and then I took her down to Dr. Leonie Douglas office where together we did a flexible sigmoidoscopy, and after finally getting the scope, which was a small  scope, into the anal canal, the actual rectum itself does not look as inflamed as she has had looked previously.  It appears that this is really right at the anus, and because of the pain and swelling and no improvement with the hemorrhoidal creams and etc., I recommended that we do surgery here.  Whether it will be a hemorrhoidectomy, sphincterotomy, or exactly what, we will determine at the time of surgery.  DESCRIPTION OF PROCEDURE:  The patient preoperatively was given 1 g of Kefzol, and induction of general anesthesia.  We placed her up in the yellow ______ stirrups since she has a graft in the left thigh area and I did not want to flex her too extensively.  In this position, the external hemorrhoids are well exposed, and I first try to gently dilate her anus so that we could get a couple of fingers in, and then we prepped the perianal area and distal anus with Betadine surgical solution.  We then draped her in a sterile manner.  I then placed a very small anoscope into the canal, and the really internal portion does not look all that bad.  There appears to be kind of a stricture right at the anal canal, and I then dilated this with two fingers and then three fingers.  Then I could get the operating anoscope in.  First, I removed this little area that looked like a thombosed external hemorrhoid, and kind of scared it interiorly up to the perineal body area.  There was no active infection.  I did send the tissue for pathology exam.  Then after this area had been removed, it was probably an old thrombosed external hemorrhoid, I closed the anal dura with interrupted sutures of 2-0 chromic.  Next, the largest and most symptomatic area with anoscope in, I basically opened that, removed a little thin layer of skin externally, and again this looks kind of a thombosed hemorrhoid, there is a lot of significant infection or exactly what I am not sure, but there was definitely not an abscess,  and this area was excised.  Then I took the cautery and just kind of divided the most superficial portion of the external sphincter so that the patient might not have such sphincter spasm postoperatively.  I then closed this with interrupted sutures of 2-0 chromic.  The little pedicle area was sutured for hemostasis.  Then after completion of this, it would admit two to three fingers easily.  I then waited until she was awakened up, and I think she has enough sphincter tone that she will be continent.  I hope also that we dilated her enough that she will not have any severe spasm.  I then placed antibiotic topical lubricant ointment upon the floor basically for hemostasis and also postoperative pain control.  We will plan on keeping her overnight and check her BMET in the morning, and if pain commence, we will probably discharge her. Her usual dialysis days are on Monday, Wednesday, and Friday.  I am going to keep her on antibiotics and await for the pathology report, however, this is probably a stricture from the ulcerative proctitis, and ______ reaction from this external hemorrhoid. Dictated by:   Orson Ape. [name], M.D. Attending Physician:  Myrtie Cruise DD:  03/10/01 TD:  03/12/01 Job: [id number]

## 2010-09-24 NOTE — Op Note (Signed)
[place]. [facility]  Patient:    [name], [name]                       MRN: [id number] Proc. Date: 10/07/99 Adm. Date:  [id number] Disc. Date: [id number] Attending:  Hortencia Pilar CC:         [name]. [name], M.D.             [name]. Oletta Lamas, M.D.                           Operative Report  PREOPERATIVE DIAGNOSIS:  End-stage renal disease, bilateral radiolucent renal stones.  POSTOPERATIVE DIAGNOSIS:  End-stage renal disease, bilateral radiolucent renal stones.  PROCEDURE:  Cystoscopy, left ureteroscopy with laser lithotripsy, basket stone extraction, and insertion of a left double-J stent.  SURGEON:  Marshall Cork. Jeffie Pollock, M.D.  ANESTHESIA:  General.  SPECIMENS:  Stone fragments.  DRAINS:  A 6-French with 24 cm double-J stent.  COMPLICATIONS:  Left lower pole ureteral tear.  INDICATIONS:  The patient is a 70 year old black female [place] with end-stage renal disease who was recently admitted for abdominal pain.  She was found to have bilateral renal calculi and hydronephrosis on the left. Subsequent cystoscopy and retrogrades confirmed the presence of bilateral stones on the left.  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  The patient had undergone placement of three stents, upper and lower pole on the left and a single on the right.  Initially, I had planned to consider bilateral nephrectomy due to the patients severe oliguria and bilateral stones.  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  FINDINGS AND DESCRIPTION OF PROCEDURE:  The patient was taken to the operating room where a general anesthetic was induced.  She had been on vancomycin and gentamicin preoperatively.   She was placed in the lithotomy position.  The perineum and genitalia were prepped with Betadine solution.  She was draped in the usual sterile fashion.  Cystoscopy was performed using a 22-French scope and the 12 degree lens.  The lower pole stent on the left was grasped with the grasping forceps, mid-ureteral orifice.  A guidewire was then passed through the stone to the kidney.  The cystoscope was removed and a 12-14 introducer sheath was then inserted over a wire without difficulty to the kidney.  The central core of the dilator was removed and a 6-French flexible scope was inserted without difficulty.  Inspection revealed stones primarily in two calyces of the upper pole.  An occasional other small stone was identified, but nothing that should not be capable to pass.  The Candela laser was then used to engage both these groups of stones.  A total of 2653 pulses at 80 joules were used.  All of the stones were reduced to fragments in the 1-3 mm range.  At this point, a nitinol parachute basket was used to remove a few stone fragments.  I then backed the scope up in an attempt the inspect the ureter, and upon doing so, noticed that the ureter had torn laterally, quite extensively.  There was a medial ridge of epithelium, that it was quite difficult for me to find  the lumen back into the kidney.  After confirming the tear with contrast and considerable effort using the flexible endoscope, I was able to identify the true lumen of the ureter and negotiate the ureteroscope back into the renal pelvis where stone fragments were identified.  I then passed the guidewire into the kidney and then backed the ureteroscope and introducer catheter out.  I then reinserted the cystoscope over the wire and placed a 6-French 24 cm double-J stent back up to the lower pole collecting system.  The guidewire was removed and a good coil of the stent was noted in the upper portion of the lower pole  collecting system, and a good coil was noted in the bladder.  At this point, the cystoscope was removed and a Foley catheter was inserted and placed to straight drainage.  The patients anesthetic was reversed and she was moved to the recovery room in stable condition.  Initially, the procedure had been planned as an outpatient, but because of ureteral-generated extravasation, I am going to watch her overnight to make sure she does not develop a fever.  I have notified the nephrologist who will be helping manage her during this stay. DD:  10/07/99 TD:  10/12/99 Job: [id number]

## 2010-09-24 NOTE — Op Note (Signed)
[facility]  Patient:    [name], [name]                       MRN: [id number] Proc. Date: 12/09/99 Adm. Date:  [id number] Disc. Date: [id number] Attending:  Hortencia Pilar                           Operative Report  PREOPERATIVE DIAGNOSIS:  End-stage renal disease with bilateral renal stone obstruction.  POSTOPERATIVE DIAGNOSIS:  End-stage renal disease with bilateral renal stone obstruction.  OPERATION PERFORMED:  Left ureteroscopy with laser lithotripsy extraction and insertion of Double J stent.  SURGEON:  Marshall Cork. Jeffie Pollock, M.D.  ANESTHESIA:  General.  SPECIMEN:  Stone fragments.  DRAINS:  6-French with 24 cm Double J stent.  COMPLICATIONS:  Ureteral tear.  INDICATIONS:  [name] is a 70 year old black female with a history of end-stage renal disease who has bilateral renal stones with obstruction.  She had previously undergone cystoretogrades and stenting, and was found to have a duplicated system on the left and insertion of the ureter from the lower pole. She had underwent placement of three ureteral stents and returns today for laser lithotripsy of the stones.  DESCRIPTION OF PROCEDURE:  The patient was taken to the operating room.  She had been on an antibiotic preoperatively.  A general anesthetic was induced. She was placed in the lithotomy position.  Her perineum and genitalia were prepped with Betadine solution.  She was draped in the usual sterile fashion. The stent from the lower pole of the left kidney was grasped with grasping forceps and moved to the ureteral meatus where a wire was then passed through the stent to the lower pole of the left kidney.  I then inserted a 12-14 introducer sheath over the wire and advanced it to the kidney.  The core of the introducer sheath and wire were then removed, and then a 6-French flexible ureteroscope was passed.  The stone was visualized in the pelvis of the kidney and was engaged  with the candela laser.  I was able to break the stone into small fragments, but once the stone had been thoroughly fragmented, I was preparing to replace the wire through the scope when I began to back up the introducer sheath, I noticed a rather significant lateral tear of the ureter than extended approximately 3 cm from the UPJ into the proximal ureter.  This was a full thickness tear.  At this point, I was able with some effort to negotiate the flexible scope back into the renal pelvis.  I passed a wire up to the renal pelvis and removed the scope.  I then replaced the cystoscope over the wire and reinserted a 6-French 24 cm Double J stent.  Once the stent was in position, the scope was removed and a Foley catheter was inserted.  I had previously intended to treat the stones in the upper pole of the left kidney and the right renal unit, but because of the ureteral tear and the patients end-stage renal disease, I was concerned that fluid overload may be a possibility from the tear and elected to not treat the other renal units at this time.  At this point, the patient was taken down from lithotomy position. Her anesthetic was reversed and was moved to the recovery room in stable condition.  The decision was made to keep her overnight for observation because of the ureteral  tear.  There were no other complications during the procedure. DD:  12/09/99 TD:  12/11/99 Job: [id number]

## 2010-09-24 NOTE — H&P (Signed)
NAME:  [name], [name]                        ACCOUNT NO.:  [id number]   MEDICAL RECORD NO.:  [id number]                   PATIENT TYPE:  INP   LOCATION:  2550                                 FACILITY:  [facility]   PHYSICIAN:  Lucina Mellow. Terance Hart, M.D.             DATE OF BIRTH:  1940/07/06   DATE OF ADMISSION:  03/20/2003  DATE OF DISCHARGE:                                HISTORY & PHYSICAL   HISTORY:  This 70 year old black female who was seen by me on February 13, 2003 with a history of suspected right renal mass felt to be due to renal  cell carcinoma on CT scan. Dr. Jeffie Pollock had seen her several years ago when she  was having problems with urinary tract calculi. She also is a dialysis  patient and had undergone a left nephrectomy in March of this year for renal  cell carcinoma. Apparently, she had a postoperative hematoma. Followup CT  scans showed resolution of his hematoma but persistent of an atrophic right  kidney with multiple calcifications and enhancing lesion in the posterior  aspect of the right kidney. 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 She is admitted at this time for right radical nephrectomy.   She has had multiple AV fistulas and surgeries for her dialysis status. She  also has underlying lupus causing renal dysfunction.   Other surgical procedures have included parathyroidectomy and a  cholecystectomy and a hemorrhoidectomy and a thyroidectomy.   She has had problems with peptic ulcer disease and lupus arthritis.   MEDICATIONS:  1. Procardia XL 60 mg at bedtime.  2. Toprol-XL 100 mg at bedtime.  3. Altace 2.5 mg at bedtime.  4. Os-Cal two before meals.  5. Protonix 40 mg at bedtime.  6. Nephro-Vite.  7. Pancrease.  8. Aceon at bedtime.   ALLERGIES:  She is allergic to SEPTRA, TETRACYCLINE, CIPRO, PENICILLIN,  AMOXICILLIN, ERYTHROMYCIN.   She does not smoke or drink alcohol.   FAMILY HISTORY:  Noncontributory.   REVIEW OF SYSTEMS:  Noted in health and history form.   PHYSICAL EXAMINATION:  VITAL SIGNS:  Blood pressure 159/90, pulse 98,  temperature 98.8. She has some Cushingoid faces.  NECK:  Supple.  LUNGS:  No respiratory distress.  HEART:  Tones are regular.  ABDOMEN:  Soft and nontender with a left flank incision. She has got AV  fistula of her left arm.   IMPRESSION:  1. Right renal cell carcinoma.  2. Renal failure.  3. Systemic lupus.  4. History of peptic ulcer disease.  5. Arthritis.  6. Hypertension.   PLAN:  Right radical nephrectomy.  Lucina Mellow. Terance Hart, M.D.    LJP/MEDQ  D:  03/20/2003  T:  03/20/2003  Job:  [id number]   cc:   Caren Griffins B. Lorrene Reid, M.D.  [address]  [place]  [place] [address]  Fax: [phone]

## 2010-09-24 NOTE — Discharge Summary (Signed)
[facility]  Patient:    [name], [name]                       MRN: [id number] Adm. Date:  [id number] Disc. Date: [id number] Attending:  Hortencia Pilar CC:         Windy Kalata, M.D.             [place] Jeffie Pollock, M.D.                           Discharge Summary  ADMITTING DIAGNOSIS:  Abdominal pain and fever.  FINAL DIAGNOSES:  1. Abdominal pain and fever resolved, source unclear; was empirically given     antibiotics.  2. Ulcerative colitis, clinically stable.  3. Chronic pancreatitis, etiology undetermined, diagnosed on CT scan.  4. End-stage renal disease on hemodialysis.  5. Right renal stones with hydronephrosis, left greater than right.  6. History of duodenal ulcer status post perforated ulcer.  7. History of abdominal abscesses secondary to diverticulitis, status post     exploratory laparotomy and lysis of adhesions, and ostomy follow-up     take-down.  8. History of clotting abnormalities worked up in the past extensively.  9. Status post cholecystectomy. 10. History of chronic esophageal reflux disease.  PROCEDURES:  Bilateral stent placement, Dr. Irine Seal, with cystogram and retrograde pyelogram.  HISTORY OF PRESENT ILLNESS:  A nice woman with a known ulcerative colitis. She has been doing reasonably well on a steroid taper.  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  But, she has had malaise, vague abdominal pain, fever.  Due to her history of ulcerative colitis, steroid use, end-stage renal disease, abdominal pain, and fever, she was admitted to the hospital.  PHYSICAL EXAMINATION:  VITAL SIGNS:  Temperature 101.2, blood pressure 169/76, pulse 96, respirations 22.  GENERAL:  A pleasant black female in no severe distress.  HEART AND LUNGS:  Exams were normal.  ABDOMEN:  Soft with tenderness in the left upper  quadrant, no guarding, rigidity, or rebound.  No lower abdominal tenderness.  For more details, please see dictated admission history and physical.  HOSPITAL COURSE:  Patient was admitted with a hemoglobin of 10.8 which is fairly normal for her, and a white count of 14.6.  Total bilirubin was 0.4. She was seen in consultation by Dr. Fleet Contras who represents renal services and manages her hemodialysis, and she was given antibiotics, specifically vancomycin and gentamicin, while blood cultures were obtained. She was having no bleeding from her rectum that was obvious.  We then increased her prednisone to 20 mg b.i.d. for a short period of time.  As part of the workup for her fever, she had a CT of the abdomen performed which revealed echogenic kidneys compatible with medical renal disease as well as moderate left hydronephrosis and a hydroureter.  This was confirmed by ultrasound of the abdomen.  Patient had an additional finding of changes of chronic pancreatitis which apparently had been noticed to a very mild extent on previous CTs.  She had very small pseudocysts and calcifications noted. She was a nondrinker.  In order to better examine her pancreatic system, she did have an MR of the pancreas and an MR CT.  This showed a normal biliary tree with a  slightly dilated and beaded pancreatic duct consistent with diagnosis of chronic pancreatitis as well as polycystic kidney disease with a hydronephrosis as noted on the CT and the ultrasound.  Patient did very well after the antibiotics.  She was seen in consultation by Dr. Irine Seal for nephrology who recommend a cystoscopy with bilateral retrograde pyelogram and this was performed, and she was found to have a cyst and stones in the ureters and bilateral stents were placed.  Following this, the patient did quite well. There is a concern over the possible need for nephrectomy and it was not clear at the time of her discharge whether  this would be ultimately necessary or not.  She was followed by Dr. Jeffie Pollock and the nephrology physicians who discussed this.  She continued to improve and was felt to be in satisfactory condition for discharge.  DISPOSITION:  The patient is discharged home in improved condition.  Decision at this time was made to avoid nephrectomy if at all possible.  She was eating a renal diet and doing quite well, having no diarrhea or bleeding.  DISCHARGE MEDICATIONS:  1. Prednisone 5 mg two tablets daily for one week, then one tablet daily.  2. Asacol 400 mg three tablets t.i.d.  3. Creon 20 mg one tablet a.c. and h.s.  4. Toprol XL 50 mg one tablet daily.  5. Combivent inhaler two puffs q.i.d.  6. Protonix 40 mg daily.  7. Procardia XL 60 mg q.h.s.  8. Renagel three tablets daily with meals.  DIET:  She will follow the kidney-failure, 80 g protein, 2 g sodium, 2 g potassium diet, and limit her fluids to five cups a day.  In addition to this, her diet will be low residue for her ulcerative colitis.  She has been instructed in this.  FOLLOW-UP:  She is instructed to follow up with Dr. Laurence Spates in one month and to follow up with Dr. Irine Seal at approximately the same time.  In addition to this, she will follow up at the [facility]. DD:  11/06/99 TD:  11/06/99 Job: [id number]

## 2010-10-07 ENCOUNTER — Other Ambulatory Visit: Payer: Self-pay | Admitting: Nephrology

## 2010-10-07 ENCOUNTER — Encounter (HOSPITAL_COMMUNITY): Payer: Medicare Other

## 2010-10-07 LAB — RENAL FUNCTION PANEL
Albumin: 3.9 g/dL (ref 3.5–5.2)
BUN: 35 mg/dL — ABNORMAL HIGH (ref 6–23)
CO2: 25 mEq/L (ref 19–32)
Chloride: 102 mEq/L (ref 96–112)
Creatinine, Ser: 1.75 mg/dL — ABNORMAL HIGH (ref 0.4–1.2)
GFR calc Af Amer: 35 mL/min — ABNORMAL LOW (ref >60)
GFR calc non Af Amer: 29 mL/min — ABNORMAL LOW (ref >60)
Potassium: 3.7 mEq/L (ref 3.5–5.1)

## 2010-10-07 LAB — IRON AND TIBC
Saturation Ratios: 54 % (ref 20–55)
TIBC: 222 ug/dL — ABNORMAL LOW (ref 250–470)
UIBC: 103 ug/dL

## 2010-10-07 LAB — POCT HEMOGLOBIN-HEMACUE: Hemoglobin: 12.5 g/dL (ref 12.0–15.0)

## 2010-10-21 ENCOUNTER — Encounter (HOSPITAL_COMMUNITY): Payer: Medicare Other

## 2010-10-28 ENCOUNTER — Other Ambulatory Visit: Payer: Self-pay | Admitting: Nephrology

## 2010-10-28 ENCOUNTER — Encounter (HOSPITAL_COMMUNITY): Payer: Medicare Other | Attending: Nephrology

## 2010-10-28 DIAGNOSIS — N184 Chronic kidney disease, stage 4 (severe): Secondary | ICD-10-CM | POA: Insufficient documentation

## 2010-10-28 DIAGNOSIS — D638 Anemia in other chronic diseases classified elsewhere: Secondary | ICD-10-CM | POA: Insufficient documentation

## 2010-10-28 DIAGNOSIS — Z940 Kidney transplant status: Secondary | ICD-10-CM | POA: Insufficient documentation

## 2010-10-28 LAB — IRON AND TIBC
Iron: 77 ug/dL (ref 42–135)
UIBC: 130 ug/dL

## 2010-11-18 ENCOUNTER — Other Ambulatory Visit: Payer: Self-pay | Admitting: Nephrology

## 2010-11-18 ENCOUNTER — Encounter (HOSPITAL_COMMUNITY): Payer: Medicare Other | Attending: Nephrology

## 2010-11-18 DIAGNOSIS — N184 Chronic kidney disease, stage 4 (severe): Secondary | ICD-10-CM | POA: Insufficient documentation

## 2010-11-18 DIAGNOSIS — Z940 Kidney transplant status: Secondary | ICD-10-CM | POA: Insufficient documentation

## 2010-11-18 DIAGNOSIS — D638 Anemia in other chronic diseases classified elsewhere: Secondary | ICD-10-CM | POA: Insufficient documentation

## 2010-11-19 LAB — POCT HEMOGLOBIN-HEMACUE: Hemoglobin: 11.4 g/dL — ABNORMAL LOW (ref 12.0–15.0)

## 2010-12-09 ENCOUNTER — Encounter (HOSPITAL_COMMUNITY): Payer: Medicare Other | Attending: Nephrology

## 2010-12-09 ENCOUNTER — Other Ambulatory Visit: Payer: Self-pay | Admitting: Nephrology

## 2010-12-09 DIAGNOSIS — N184 Chronic kidney disease, stage 4 (severe): Secondary | ICD-10-CM | POA: Insufficient documentation

## 2010-12-09 DIAGNOSIS — D638 Anemia in other chronic diseases classified elsewhere: Secondary | ICD-10-CM | POA: Insufficient documentation

## 2010-12-09 DIAGNOSIS — Z940 Kidney transplant status: Secondary | ICD-10-CM | POA: Insufficient documentation

## 2010-12-09 LAB — IRON AND TIBC
Iron: 123 ug/dL (ref 42–135)
Saturation Ratios: 59 % — ABNORMAL HIGH (ref 20–55)
TIBC: 210 ug/dL — ABNORMAL LOW (ref 250–470)
UIBC: 87 ug/dL

## 2010-12-10 ENCOUNTER — Inpatient Hospital Stay (HOSPITAL_COMMUNITY)
Admission: EM | Admit: 2010-12-10 | Discharge: 2010-12-23 | DRG: 357 | Disposition: A | Discharge status: Home Health | Payer: Medicare Other | Attending: Internal Medicine | Admitting: Internal Medicine

## 2010-12-10 ENCOUNTER — Emergency Department (HOSPITAL_BASED_OUTPATIENT_CLINIC_OR_DEPARTMENT_OTHER)
Admission: EM | Admit: 2010-12-10 | Discharge: 2010-12-10 | Discharge status: Against Medical Advice | Payer: Medicare Other | Source: Home / Self Care

## 2010-12-10 ENCOUNTER — Encounter: Payer: Self-pay | Admitting: *Deleted

## 2010-12-10 DIAGNOSIS — K5731 Diverticulosis of large intestine without perforation or abscess with bleeding: Principal | ICD-10-CM | POA: Diagnosis present

## 2010-12-10 DIAGNOSIS — Z01812 Encounter for preprocedural laboratory examination: Secondary | ICD-10-CM

## 2010-12-10 DIAGNOSIS — IMO0002 Reserved for concepts with insufficient information to code with codable children: Secondary | ICD-10-CM

## 2010-12-10 DIAGNOSIS — Z531 Procedure and treatment not carried out because of patient's decision for reasons of belief and group pressure: Secondary | ICD-10-CM

## 2010-12-10 DIAGNOSIS — Z905 Acquired absence of kidney: Secondary | ICD-10-CM

## 2010-12-10 DIAGNOSIS — N184 Chronic kidney disease, stage 4 (severe): Secondary | ICD-10-CM | POA: Diagnosis present

## 2010-12-10 DIAGNOSIS — Z86718 Personal history of other venous thrombosis and embolism: Secondary | ICD-10-CM

## 2010-12-10 DIAGNOSIS — Z79899 Other long term (current) drug therapy: Secondary | ICD-10-CM

## 2010-12-10 DIAGNOSIS — I498 Other specified cardiac arrhythmias: Secondary | ICD-10-CM | POA: Diagnosis not present

## 2010-12-10 DIAGNOSIS — T82898A Other specified complication of vascular prosthetic devices, implants and grafts, initial encounter: Secondary | ICD-10-CM | POA: Diagnosis present

## 2010-12-10 DIAGNOSIS — I129 Hypertensive chronic kidney disease with stage 1 through stage 4 chronic kidney disease, or unspecified chronic kidney disease: Secondary | ICD-10-CM | POA: Diagnosis present

## 2010-12-10 DIAGNOSIS — K644 Residual hemorrhoidal skin tags: Secondary | ICD-10-CM | POA: Diagnosis present

## 2010-12-10 DIAGNOSIS — E876 Hypokalemia: Secondary | ICD-10-CM | POA: Diagnosis not present

## 2010-12-10 DIAGNOSIS — I824Y9 Acute embolism and thrombosis of unspecified deep veins of unspecified proximal lower extremity: Secondary | ICD-10-CM | POA: Diagnosis present

## 2010-12-10 DIAGNOSIS — M329 Systemic lupus erythematosus, unspecified: Secondary | ICD-10-CM | POA: Diagnosis present

## 2010-12-10 DIAGNOSIS — Z940 Kidney transplant status: Secondary | ICD-10-CM

## 2010-12-10 DIAGNOSIS — D62 Acute posthemorrhagic anemia: Secondary | ICD-10-CM | POA: Diagnosis present

## 2010-12-10 DIAGNOSIS — I4891 Unspecified atrial fibrillation: Secondary | ICD-10-CM | POA: Diagnosis present

## 2010-12-10 DIAGNOSIS — D638 Anemia in other chronic diseases classified elsewhere: Secondary | ICD-10-CM | POA: Diagnosis present

## 2010-12-10 DIAGNOSIS — N179 Acute kidney failure, unspecified: Secondary | ICD-10-CM | POA: Diagnosis present

## 2010-12-10 DIAGNOSIS — R197 Diarrhea, unspecified: Secondary | ICD-10-CM | POA: Insufficient documentation

## 2010-12-10 DIAGNOSIS — Z880 Allergy status to penicillin: Secondary | ICD-10-CM

## 2010-12-10 DIAGNOSIS — K5090 Crohn's disease, unspecified, without complications: Secondary | ICD-10-CM | POA: Diagnosis present

## 2010-12-10 DIAGNOSIS — E039 Hypothyroidism, unspecified: Secondary | ICD-10-CM | POA: Diagnosis present

## 2010-12-10 HISTORY — DX: Disorder of kidney and ureter, unspecified: N28.9

## 2010-12-10 HISTORY — DX: Unspecified osteoarthritis, unspecified site: M19.90

## 2010-12-10 HISTORY — DX: Essential (primary) hypertension: I10

## 2010-12-10 HISTORY — DX: Crohn's disease, unspecified, without complications: K50.90

## 2010-12-10 LAB — ABO/RH: ABO/RH(D): O POS

## 2010-12-10 LAB — COMPREHENSIVE METABOLIC PANEL
Albumin: 3.6 g/dL (ref 3.5–5.2)
Alkaline Phosphatase: 109 U/L (ref 39–117)
BUN: 46 mg/dL — ABNORMAL HIGH (ref 6–23)
CO2: 25 mEq/L (ref 19–32)
Chloride: 101 mEq/L (ref 96–112)
Creatinine, Ser: 1.94 mg/dL — ABNORMAL HIGH (ref 0.50–1.10)
GFR calc non Af Amer: 26 mL/min — ABNORMAL LOW (ref >60)
Potassium: 5.1 mEq/L (ref 3.5–5.1)
Total Bilirubin: 0.2 mg/dL — ABNORMAL LOW (ref 0.3–1.2)

## 2010-12-10 LAB — DIFFERENTIAL
Basophils Absolute: 0.1 10*3/uL (ref 0.0–0.1)
Basophils Relative: 0 % (ref 0–1)
Eosinophils Absolute: 0.1 10*3/uL (ref 0.0–0.7)
Eosinophils Relative: 1 % (ref 0–5)
Monocytes Absolute: 1.1 10*3/uL — ABNORMAL HIGH (ref 0.1–1.0)
Monocytes Relative: 9 % (ref 3–12)
Neutro Abs: 7.7 10*3/uL (ref 1.7–7.7)

## 2010-12-10 LAB — CBC
Hemoglobin: 9.5 g/dL — ABNORMAL LOW (ref 12.0–15.0)
MCH: 29.1 pg (ref 26.0–34.0)
MCHC: 31.7 g/dL (ref 30.0–36.0)
Platelets: 155 10*3/uL (ref 150–400)
RDW: 15.3 % (ref 11.5–15.5)

## 2010-12-10 LAB — TYPE AND SCREEN
ABO/RH(D): O POS
Antibody Screen: NEGATIVE

## 2010-12-10 LAB — GLUCOSE, CAPILLARY

## 2010-12-10 NOTE — ED Notes (Signed)
Bloody diarrhea since this am. Denies pain. Nausea without vomiting. Hx of same but can't remember when.

## 2010-12-11 ENCOUNTER — Inpatient Hospital Stay (HOSPITAL_COMMUNITY): Payer: Medicare Other

## 2010-12-11 LAB — MRSA PCR SCREENING: MRSA by PCR: NEGATIVE

## 2010-12-11 LAB — GLUCOSE, CAPILLARY
Glucose-Capillary: 131 mg/dL — ABNORMAL HIGH (ref 70–99)
Glucose-Capillary: 145 mg/dL — ABNORMAL HIGH (ref 70–99)
Glucose-Capillary: 97 mg/dL (ref 70–99)
Glucose-Capillary: 88 mg/dL (ref 70–99)
Glucose-Capillary: 97 mg/dL (ref 70–99)

## 2010-12-11 LAB — CBC
Hemoglobin: 6.5 g/dL — CL (ref 12.0–15.0)
Platelets: 95 10*3/uL — ABNORMAL LOW (ref 150–400)
RBC: 2.22 MIL/uL — ABNORMAL LOW (ref 3.87–5.11)
WBC: 9.8 10*3/uL (ref 4.0–10.5)

## 2010-12-11 LAB — COMPREHENSIVE METABOLIC PANEL
AST: 12 U/L (ref 0–37)
Albumin: 2.8 g/dL — ABNORMAL LOW (ref 3.5–5.2)
BUN: 38 mg/dL — ABNORMAL HIGH (ref 6–23)
Calcium: 8.6 mg/dL (ref 8.4–10.5)
Creatinine, Ser: 1.69 mg/dL — ABNORMAL HIGH (ref 0.50–1.10)
Total Bilirubin: 0.2 mg/dL — ABNORMAL LOW (ref 0.3–1.2)

## 2010-12-11 LAB — CARDIAC PANEL(CRET KIN+CKTOT+MB+TROPI)
CK, MB: 3.9 ng/mL (ref 0.3–4.0)
Relative Index: INVALID (ref 0.0–2.5)
Total CK: 77 U/L (ref 7–177)
Troponin I: <0.3 ng/mL (ref <0.30)

## 2010-12-11 LAB — LIPASE, BLOOD: Lipase: 14 U/L (ref 11–59)

## 2010-12-12 LAB — CBC
Hemoglobin: 5.4 g/dL — CL (ref 12.0–15.0)
MCH: 28.9 pg (ref 26.0–34.0)
MCHC: 31.6 g/dL (ref 30.0–36.0)
Platelets: 93 10*3/uL — ABNORMAL LOW (ref 150–400)
RBC: 1.87 MIL/uL — ABNORMAL LOW (ref 3.87–5.11)

## 2010-12-12 LAB — VITAMIN B12: Vitamin B-12: 296 pg/mL (ref 211–911)

## 2010-12-12 LAB — FOLATE: Folate: 8 ng/mL

## 2010-12-13 ENCOUNTER — Inpatient Hospital Stay (HOSPITAL_COMMUNITY): Payer: Medicare Other

## 2010-12-13 LAB — CBC
MCH: 29.2 pg (ref 26.0–34.0)
MCV: 93.5 fL (ref 78.0–100.0)
Platelets: 97 10*3/uL — ABNORMAL LOW (ref 150–400)
RBC: 1.85 MIL/uL — ABNORMAL LOW (ref 3.87–5.11)
RDW: 15.8 % — ABNORMAL HIGH (ref 11.5–15.5)
WBC: 9.6 10*3/uL (ref 4.0–10.5)

## 2010-12-13 LAB — RENAL FUNCTION PANEL
Albumin: 3 g/dL — ABNORMAL LOW (ref 3.5–5.2)
Calcium: 9.1 mg/dL (ref 8.4–10.5)
Creatinine, Ser: 1.78 mg/dL — ABNORMAL HIGH (ref 0.50–1.10)
GFR calc Af Amer: 34 mL/min — ABNORMAL LOW (ref >60)
GFR calc non Af Amer: 28 mL/min — ABNORMAL LOW (ref >60)
Phosphorus: 3.4 mg/dL (ref 2.3–4.6)
Sodium: 138 mEq/L (ref 135–145)

## 2010-12-13 MED ORDER — TECHNETIUM TC 99M-LABELED RED BLOOD CELLS IV KIT: 25.0000 | PACK | Freq: Once | INTRAVENOUS | Status: AC | PRN

## 2010-12-14 DIAGNOSIS — D62 Acute posthemorrhagic anemia: Secondary | ICD-10-CM

## 2010-12-14 DIAGNOSIS — N170 Acute kidney failure with tubular necrosis: Secondary | ICD-10-CM

## 2010-12-14 DIAGNOSIS — K5731 Diverticulosis of large intestine without perforation or abscess with bleeding: Secondary | ICD-10-CM

## 2010-12-14 LAB — GLUCOSE, CAPILLARY
Glucose-Capillary: 123 mg/dL — ABNORMAL HIGH (ref 70–99)
Glucose-Capillary: 83 mg/dL (ref 70–99)

## 2010-12-14 LAB — HEMOGLOBIN AND HEMATOCRIT, BLOOD
HCT: 15.7 % — ABNORMAL LOW (ref 36.0–46.0)
Hemoglobin: 4.9 g/dL — CL (ref 12.0–15.0)

## 2010-12-14 NOTE — Procedures (Signed)
EEG NUMBER:  REFERRING PHYSICIAN:  [name], MD  HISTORY:  A 70 year old female with seizure-like activity.  MEDICATIONS:  Prograf, Myfortic, Synthroid, calcitriol, amiodarone, Aranesp, Protonix, Solu-Cortef, Feraheme, and Ambien.  CONDITIONS OF RECORDING:  This is a 16-channel EEG, carried out with the patient in the awake, drowsy, and asleep states.  DESCRIPTION:  The waking background activity consists of a low-voltage symmetrical fairly well-organized 11 Hz alpha activity seen from the parieto-occipital and posterotemporal regions.  Low-voltage fast activity poorly organized, was seen anteriorly at times superimposed on more posterior rhythms.  A mixture of theta and alpha rhythm was seen from the central and temporal regions.  The patient drowses with slowing to irregular, low-voltage theta and beta activity.  The patient goes into a light sleep with symmetrical sleep spindles and vertex with a sharp activity and irregular slow activity.  Hypoventilation was not performed.  Intermittent photic stimulation elicits a symmetrical driving response, but fails to elicit any abnormalities.  IMPRESSION:  This is a normal EEG.          ______________________________ [name], MD    UP:JSRP D:  12/13/2010 18:53:06  T:  12/13/2010 21:45:54  Job #:  [id number]

## 2010-12-15 ENCOUNTER — Inpatient Hospital Stay (HOSPITAL_COMMUNITY): Payer: Medicare Other

## 2010-12-15 DIAGNOSIS — D649 Anemia, unspecified: Secondary | ICD-10-CM

## 2010-12-15 DIAGNOSIS — K922 Gastrointestinal hemorrhage, unspecified: Secondary | ICD-10-CM

## 2010-12-15 NOTE — Consult Note (Signed)
NAMEMarland Kitchen  LYNNITA, SOMMA NO.:  [id number]  MEDICAL RECORD NO.:  [id number]  LOCATION:  2907                         FACILITY:  [facility]  PHYSICIAN:  Gwenyth Ober, M.D.    DATE OF BIRTH:  1941-03-24  DATE OF CONSULTATION:  12/14/2010 DATE OF DISCHARGE:                                CONSULTATION   REQUESTING PHYSICIAN:  Arta Silence, MD  CONSULTING SURGEON:  Kathryne Eriksson. Hulen Skains, MD  REASON FOR CONSULTATION:  GI bleed.  HISTORY OF PRESENT ILLNESS:  Ms. Brannum is a very pleasant 70 year old African American female who is a Sales promotion account executive Witness and has a history of end-stage renal disease secondary to lupus, but is now status post renal transplant 2006 and has actually done quite well.  She also has an underlying history of questionable Crohn disease, but also has had a history of perforated sigmoid diverticulitis requiring sigmoid colectomy and end colostomy about 15 years ago.  She subsequently had that reversed about 6 months after that and had done very well since that time.  She has had prior history of ulcer disease and has actually had bouts of acute-on-chronic anemia in the past, but due to her religious status has never received blood products.  She had been on things such as EPO and Aranesp as well as Procrit to assist with her chronic anemia. She, however, developed several days of hematochezia, was initially concerned that this was perhaps may be a ruptured or bleeding hemorrhoid, but it persisted and she felt a little bit weaker; therefore, she brought herself to the emergency department and was obviously found to have heme-positive stools and was admitted with a hemoglobin of 9.5.  She was admitted, and GI bleed workup was subsequently commenced.  Her EGD was negative.  A nuclear medicine red blood cell scan was negative, and a colonoscopy/flex sig done this morning actually found significant amounts of old and fresh blood in the descending colon.  Multiple  diverticula are seen, but no obvious bleeding or active source of bleeding was identified.  The patient's hemoglobin has subsequently decreased to 4.9, and she does get hypotensive particularly orthostatically and is actually on bedrest at present time.  Surgical consult is requested for possible recommendations or surgical intervention if necessary.  PAST MEDICAL HISTORY: 1. Again, significant for end-stage renal disease secondary to lupus     status post renal transplant in 2006. 2. History of renal cell carcinoma status post bilateral nephrectomy. 3. History of perforated colon, status post colectomy, colostomy, and     reversal. 4. History of TIA. 5. Jehovah's Witness status.  SURGICAL HISTORY:  As mentioned.  MEDICATIONS: 1. Prograf 6 mg twice daily. 2. Procardia XL 60 mg daily. 3. Prilosec 20 mg daily. 4. Prednisone 7.5 mg daily. 5. Myfortic 180 mg twice daily. 6. Lopressor 20 mg twice daily. 7. Levothyroxine 50 mcg once a day. 8. Neurontin 100 mg once a day. 9. Calcitriol 0.25 mcg daily. 10.Amiodarone 100 mg twice daily. 11.Aspirin 325 mg daily.  These were the patient's home medications.     Current, active medications can be found in the medication     reconciliation section.  ALLERGIES:  ERYTHROMYCIN, TETRACYCLINE, AMOXICILLIN  all causing swelling, BACTRIM causing hives, and CIPRO causing neutropenia.  SOCIAL HISTORY:  The patient denies any tobacco, alcohol or illicit drug use.  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  FAMILY HISTORY:  Negative for any renal disease.  REVIEW OF SYSTEMS:  Please see history of present illness for pertinent findings.  Otherwise, complete 12-system review found negative.  PHYSICAL EXAMINATION:  GENERAL:  A 70 year old obese African American female who does not appear in acute distress at present time. CURRENT VITAL SIGNS:  Temperature of 98.9, heart rate in the 90s,  blood pressure anywhere from 110-130/40-60, respiratory rate of approximately 20, oxygen saturation anywhere from 75-90% on 3 L nasal cannula. LUNGS:  Clear to auscultation wheezes, rhonchi, or rales. HEART:  Regular rate and rhythm.  No murmurs, gallops, or rubs. Carotids 2+ and brisk without bruit. ABDOMEN:  Soft, nondistended, nontender.  Surgical scar is appreciated. No hypoactive bowel sounds are heard. SKIN:  Otherwise warm and dry with good turgor.  No rashes, lesions, nodules are noted. NEUROLOGIC:  The patient is alert and oriented x3.  Cranial nerves II through XII grossly intact.  Normal affect.  LABORATORY DATA:  CBC shows a white blood cell count of 9.6, hemoglobin of 4.9, hematocrit of 15.7, platelet count of 97.  Metabolic panel shows a sodium of 138, potassium 4.2, chloride of 107, CO2 of 21, BUN of 23, creatinine 1.78, glucose of 129, phosphate of 3.4, albumin of 3.0.  Coags, PT elevated at 15.9 with an INR of 1.24.  IMAGING:  Endoscopic imaging results are as noted.  CT of the abdomen and pelvis done on December 11, 2010, shows no acute findings in the abdomen or pelvis.  Tagged red blood cell scan performed on December 13, 2010, again finds no evidence of GI bleeding.  IMPRESSION: 1. Gastrointestinal bleed suspect, lower and also suspect probable     descending colon secondary to diverticular disease.  However, this     has been specifically localize at the present time. 2. Acute blood loss anemia on top of chronic anemia. 3. Jehovah's Witness.  RECOMMENDATIONS:  The patient's GI bleed source has not been localized but is suspected to be in the left colon.  However, discussion was had at length with the patient and her husband as well as Dr. Florene Glen of the Nephrology Service that partial colectomy or even total abdominal colectomy would not guarantee that the patient's bleeding would cease. The total abdominal colectomy would leave the patient with either  an ileoproctostomy resulting in significant diarrhea for the remainder of her life versus an ileostomy requiring an ileostomy bag wearing. Additionally, the patient is a poor operative candidate given her current clinical status and significant procedures such as a total abdominal colectomy would be very difficult for the patient to even survive.  Significantly more blood volume would be lost at that point in time.  The patient again is firm on her belief and wishes for no blood products.  At this point, we have discussed with the family that we would hope that this would cease on its own and that the patient could recover.  Only additional recommendation if the PT/INR is slightly elevated that she may benefit from a little bit of vitamin K and see if that will help.  They are certainly available and we will continue to follow this patient along her hospital course.     Ascencion Dike, PA-C   ______________________________ Jeneen Rinks  Michael Boston, M.D.    KB/MEDQ  D:  12/14/2010  T:  12/15/2010  Job:  [id number]  Electronically Signed by Ascencion Dike  on 12/15/2010 10:08:41 AM Electronically Signed by Judeth Horn M.D. on 12/15/2010 03:57:01 PM

## 2010-12-16 LAB — BASIC METABOLIC PANEL
CO2: 25 mEq/L (ref 19–32)
Calcium: 9.1 mg/dL (ref 8.4–10.5)
GFR calc Af Amer: 35 mL/min — ABNORMAL LOW (ref >60)
GFR calc non Af Amer: 29 mL/min — ABNORMAL LOW (ref >60)
Sodium: 136 mEq/L (ref 135–145)

## 2010-12-17 ENCOUNTER — Inpatient Hospital Stay (HOSPITAL_COMMUNITY): Payer: Medicare Other

## 2010-12-17 DIAGNOSIS — M7989 Other specified soft tissue disorders: Secondary | ICD-10-CM

## 2010-12-17 NOTE — Consult Note (Signed)
NAMESAIGE, [name] NO.:  [id number]  MEDICAL RECORD NO.:  [id number]  LOCATION:  2907                         FACILITY:  [facility]  PHYSICIAN:  Arta Silence, MD     DATE OF BIRTH:  1940-12-17  DATE OF CONSULTATION:  12/11/2010 DATE OF DISCHARGE:                                CONSULTATION   REASON FOR CONSULTATION:  Hematochezia.  CHIEF COMPLAINT:  Hematochezia.  HISTORY OF PRESENT ILLNESS:  [name] is a very pleasant 70 year old female with multiple medical problems including renal transplant in 2006, paroxysmal atrial fibrillation not on anticoagulation, history of Crohn disease, external hemorrhoids, and anal stenosis.  She presents for evaluation of blood in her stool.  Starting yesterday around 1, she had several episodes of bright red blood per rectum.  She presents to the emergency department and was found to be modestly hypertensive, which immediately responded to fluids, and was admitted to the ICU.  She has not had an episode of bleeding since late last night.  She has no abdominal pain.  She does have chronic constipation requiring MiraLax and does have a history of anal stenosis requiring some straining.  She had a colonoscopy a little over a year ago for hematochezia as well. This study showed an irritated area of anal stenosis, a few left-sided diverticula and was otherwise normal.  Past medical history, surgical history, home medications, allergies, family history, social history, review of systems all from dictated note from Dr. Hal Hope, dated December 10, 2010.  I have reviewed and I agreed.  PHYSICAL EXAMINATION:  VITAL SIGNS:  Blood pressure 107/58, oxygen saturation 100%, heart rate 78, respiratory 14 GENERAL:  [name] in no acute distress, pleasant appearing. HEENT:  Normocephalic, atraumatic.  No oropharyngeal lesions.  Eyes: Sclerae are anicteric.  Conjunctivae are pale. NECK:  Thick, but supple. HEART:  Regular. LUNGS:   Clear. ABDOMEN:  Multiple surgical scars, right-sided abdominal hernia, soft, nontender, nondistended.  No liver or splenic enlargement.  No obvious bulging flanks to suggest ascites.  Active bowel sounds. EXTREMITIES:  No peripheral cyanosis, clubbing or edema. NEUROLOGIC:  Diffusely weak, but nonfocal without lateralizing signs. PSYCHIATRIC:  Normal mood and affect. LYMPHATIC:  No palpable axillary, submandibular, or supraclavicular adenopathy. RECTAL:  She has some external hemorrhoids.  She does have very tight anal stenosis, through which I could not get all of my index examination digit through.  She did have some traces of red blood on my finger after rectal exam.  LABORATORY STUDIES:  Hemoglobin on arrival was 11.3, is dropped to 6.5, white count 9.8, platelet count 95.  Sodium 138, potassium 4.6, chloride 109, bicarb 21, BUN 38, creatinine 1.7, total protein 4.7, albumin 2.8, total bilirubin 0.2, alk phos 66, AST 12, ALT 8.  Lactic acid normal at 2.2.  RADIOLOGIC STUDIES:  She had a CT scan of her abdomen and pelvis early this morning that showed no acute findings in the abdomen and pelvis.  IMPRESSION:  [name] is a 70-year female presenting with hematochezia with drop in hemoglobin and drop in her blood pressure.  Her hemodynamics have much improved with volume repletion.  Her bleeding has seemingly, at least for the  time being, stopped.  I suspect [name] has bleeding from her anal stenosis or even hemorrhoidal bleeding.  She also could have had diverticular bleeding as well.  Doubt colitis clinically as well as based on her unrevealing CT scan.  Doubt malignancy or AVMs, as nothing was seen on a colonoscopy a little over a year ago.  PLAN: 1. I agree with volume repletion. 2. I agree with IV iron, as the patient is a Jehovah's Witness and     declines blood transfusions. 3. We will proceed with flexible sigmoidoscopy today with further     recommendations  pending these results. 4. I have discussed this in detail to the patient who agrees with our     plan.  Thanks again for allowing [place] to participate in Mr. Cesaro care.     Arta Silence, MD     WO/MEDQ  D:  12/11/2010  T:  12/11/2010  Job:  [id number]  Electronically Signed by Arta Silence  on 12/17/2010 07:25:21 PM

## 2010-12-18 LAB — CBC
Platelets: 148 10*3/uL — ABNORMAL LOW (ref 150–400)
RBC: 1.67 MIL/uL — ABNORMAL LOW (ref 3.87–5.11)
RDW: 17.7 % — ABNORMAL HIGH (ref 11.5–15.5)
WBC: 12.8 10*3/uL — ABNORMAL HIGH (ref 4.0–10.5)

## 2010-12-18 LAB — DIFFERENTIAL
Basophils Absolute: 0 10*3/uL (ref 0.0–0.1)
Eosinophils Absolute: 0.1 10*3/uL (ref 0.0–0.7)
Lymphocytes Relative: 13 % (ref 12–46)
Monocytes Absolute: 1.9 10*3/uL — ABNORMAL HIGH (ref 0.1–1.0)
Neutrophils Relative %: 71 % (ref 43–77)

## 2010-12-19 LAB — BASIC METABOLIC PANEL
CO2: 26 mEq/L (ref 19–32)
Calcium: 8.9 mg/dL (ref 8.4–10.5)
Chloride: 103 mEq/L (ref 96–112)
GFR calc Af Amer: 31 mL/min — ABNORMAL LOW (ref >60)
Sodium: 138 mEq/L (ref 135–145)

## 2010-12-20 LAB — CBC
HCT: 17.5 % — ABNORMAL LOW (ref 36.0–46.0)
MCV: 94.6 fL (ref 78.0–100.0)
Platelets: 181 10*3/uL (ref 150–400)
RBC: 1.85 MIL/uL — ABNORMAL LOW (ref 3.87–5.11)
RDW: 18 % — ABNORMAL HIGH (ref 11.5–15.5)
WBC: 14 10*3/uL — ABNORMAL HIGH (ref 4.0–10.5)

## 2010-12-22 LAB — BASIC METABOLIC PANEL
BUN: 22 mg/dL (ref 6–23)
CO2: 24 mEq/L (ref 19–32)
GFR calc non Af Amer: 25 mL/min — ABNORMAL LOW (ref >60)
Glucose, Bld: 112 mg/dL — ABNORMAL HIGH (ref 70–99)
Potassium: 3.7 mEq/L (ref 3.5–5.1)
Sodium: 137 mEq/L (ref 135–145)

## 2010-12-22 LAB — CBC
HCT: 19 % — ABNORMAL LOW (ref 36.0–46.0)
Hemoglobin: 5.6 g/dL — CL (ref 12.0–15.0)
MCHC: 29.5 g/dL — ABNORMAL LOW (ref 30.0–36.0)
RBC: 1.98 MIL/uL — ABNORMAL LOW (ref 3.87–5.11)

## 2010-12-23 LAB — HEMOGLOBIN AND HEMATOCRIT, BLOOD: HCT: 18.5 % — ABNORMAL LOW (ref 36.0–46.0)

## 2010-12-23 LAB — RENAL FUNCTION PANEL
CO2: 26 mEq/L (ref 19–32)
Calcium: 8.8 mg/dL (ref 8.4–10.5)
Chloride: 100 mEq/L (ref 96–112)
GFR calc Af Amer: 30 mL/min — ABNORMAL LOW (ref >60)
GFR calc non Af Amer: 25 mL/min — ABNORMAL LOW (ref >60)
Glucose, Bld: 150 mg/dL — ABNORMAL HIGH (ref 70–99)
Potassium: 4.5 mEq/L (ref 3.5–5.1)
Sodium: 134 mEq/L — ABNORMAL LOW (ref 135–145)

## 2010-12-27 NOTE — Consult Note (Signed)
NAMELORIE, CLECKLEY NO.:  [id number]  MEDICAL RECORD NO.:  [id number]  LOCATION:  2907                         FACILITY:  [facility]  PHYSICIAN:  Windy Kalata, M.D.DATE OF BIRTH:  May 28, 1940  DATE OF CONSULTATION:  12/11/2010 DATE OF DISCHARGE:                                CONSULTATION   REFERRING PHYSICIAN:  Niel Hummer, MD.  REASON FOR CONSULTATION:  Chronic kidney disease in a renal transplant.  HISTORY OF PRESENT ILLNESS:  This is a 70 year old black female admitted December 10, 2010, for hematochezia.  The patient has a history of Crohn's, however, she had a flex sig done today which did not show colitis, no obvious source and thus a diverticular bleed is suspected.  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  She receives Procrit on a regular basis and her hemoglobin last week was 11.4 as currently down to 6.5.  A CT scan of her abdomen and pelvis was negative yesterday.  Of note, is the fact that her blood pressure was in the 80s on arrival to the emergency room yesterday, however, with fluids and increase dose of steroids, it is better now with systolic blood pressure above 100.  PAST MEDICAL HISTORY:  Significant for 1. Jehovah witness. 2. 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 3. Crohn's. 4. Renal cell carcinoma, status post bilateral nephrectomy. 5. History of Bell palsy. 6. History DVT. 7. History of perforated bowel, status post colostomy. 8. History of SVT. 9. History of TIA.  ALLERGIES:  ERYTHROMYCIN, TETRACYCLINE and AMOXICILLIN all causes swelling BACTRIM causes hives.  CIPRO causes neutropenia.  MEDICATIONS:  Include 1. Prograf 6 mg b.i.d. 2. Procardia XL 60 mg a day. 3. Prilosec 20 mg a day. 4. Prednisone 7.5 mg a day. 5. Myfortic 180 mg 2 b.i.d. 6.  Lopressor 25 mg b.i.d. 7. Levothyroxine 50 mcg a day. 8. Neurontin 100 mg a day. 9. Calcitriol 0.25 mcg a day. 10.Amiodarone 100 mg b.i.d. 11.Aspirin 325 mg a day.  SOCIAL HISTORY:  Nonsmoker, nondrinker.  She lives in [place] with her husband of 54 years.  FAMILY HISTORY:  Negative for renal disease.  REVIEW OF SYSTEMS:  Appetite had been good.  No shortness of breath, PND, or orthopnea.  No chest pains or chest pressures.  She had some crampy abdominal pain yesterday with acute bleed, but none now.  She has no dysuria.  No new arthritic complaints.  No peripheral edema.  Rest review of systems unremarkable.  PHYSICAL EXAMINATION:  VITAL SIGNS:  Blood pressure 105/60, temperature 98.8, pulse 72. GENERAL:  This is a 70 year old black female in no acute distress. HEENT:  Sclera nonicteric.  Extraocular muscles are intact. NECK:  Reveals no JVD.  No lymphadenopathy. LUNGS:  Clear to auscultation. HEART:  Regular rate and rhythm without murmur, rub or gallop. ABDOMEN:  Positive bowel sounds, nontender, nondistended.  No splenomegaly.  There is a hernia in the right lower abdomen which is easily reducible. EXTREMITIES:  No clubbing, cyanosis or edema. NEUROLOGIC:  Cranial nerves intact.  Motor and sensory intact.  Oriented x3.  No asterixis.  LABORATORY DATA:  Sodium 138, potassium 4.6, bicarb 21, BUN 38, creatinine 1.6, calcium 8.6, albumin 2.8, hemoglobin 6.5, white count 9.8, platelet count 95,000.  IMPRESSION: 1. Gastrointestinal bleed, probable diverticuli.  The fact that she is     a Jehovah Witness will complicate this situation immensely. 2. This renal transplant with serum creatinine of 1s and stable renal     function currently. 3. Lupus, stable. 4. Crohn's, stable. 5. Hypotension, improved.  PLAN: 1. I would limit blood draws and use pediatric tubes when blood does     have to be drawn.  Thus, her H and H will be followed, but very     infrequently as since  she will not be transfused, there is not much     we are going to do about her low hemoglobin. 2. I have reduced her Solu-Medrol to 30 mg b.i.d.  I will continue IV     fluids.  Continue her immunosuppressive medicines.  We will give     her some IV iron and subcu Aranesp to help maintain blood counts.  Thank you much for consult.  We will follow the patient with you.          ______________________________ Windy Kalata, M.D.     MTM/MEDQ  D:  12/11/2010  T:  12/11/2010  Job:  [id number]  Electronically Signed by Fleet Contras M.D. on 12/27/2010 11:07:48 AM

## 2010-12-30 ENCOUNTER — Other Ambulatory Visit: Payer: Self-pay | Admitting: Nephrology

## 2010-12-30 ENCOUNTER — Encounter (HOSPITAL_COMMUNITY): Payer: Medicare Other

## 2010-12-30 LAB — RENAL FUNCTION PANEL
Albumin: 3.3 g/dL — ABNORMAL LOW (ref 3.5–5.2)
BUN: 17 mg/dL (ref 6–23)
Chloride: 97 mEq/L (ref 96–112)
GFR calc Af Amer: 40 mL/min — ABNORMAL LOW (ref >60)
GFR calc non Af Amer: 33 mL/min — ABNORMAL LOW (ref >60)
Phosphorus: 3.9 mg/dL (ref 2.3–4.6)
Potassium: 4.4 mEq/L (ref 3.5–5.1)
Sodium: 135 mEq/L (ref 135–145)

## 2010-12-30 LAB — POCT HEMOGLOBIN-HEMACUE: Hemoglobin: 7.7 g/dL — ABNORMAL LOW (ref 12.0–15.0)

## 2010-12-30 LAB — IRON AND TIBC
Iron: 32 ug/dL — ABNORMAL LOW (ref 42–135)
TIBC: 201 ug/dL — ABNORMAL LOW (ref 250–470)

## 2010-12-30 LAB — FERRITIN: Ferritin: 1049 ng/mL — ABNORMAL HIGH (ref 10–291)

## 2011-01-06 ENCOUNTER — Other Ambulatory Visit: Payer: Self-pay | Admitting: Nephrology

## 2011-01-06 ENCOUNTER — Encounter (HOSPITAL_COMMUNITY): Payer: Medicare Other

## 2011-01-06 LAB — POCT HEMOGLOBIN-HEMACUE: Hemoglobin: 7.6 g/dL — ABNORMAL LOW (ref 12.0–15.0)

## 2011-01-11 NOTE — H&P (Signed)
NAME:  [name], [name] NO.:  [id number]  MEDICAL RECORD NO.:  [id number]  LOCATION:                                 FACILITY:  PHYSICIAN:  [name], MDDATE OF BIRTH:  [birth year]  DATE OF ADMISSION: DATE OF DISCHARGE:                             HISTORY & PHYSICAL   PRIMARY CARE PHYSICIAN AND NEPHROLOGIST:  Dr. Jimmy Footman.  PRIMARY GASTROENTEROLOGIST:  Joyice Faster. Edwards, MD  CHIEF COMPLAINT:  Bleeding per rectum.  HISTORY OF PRESENT ILLNESS:  A 70 year old female with known history of lupus nephritis; renal cell carcinoma status post bilateral nephrectomy and renal dialysis until 2006, after which, the patient had renal transplant; history of paroxysmal atrial fibrillation, not on Coumadin; history of Crohn disease, last exacerbation was in the 90s, has had colonoscopy 2 years ago which was normal, EGD also subsequently which also was only showing a mild gastritis.  Presently complains of having passed blood through rectum multiple times over the last few hours.  The patient states the initial episode started in afternoon around 1 or 2 o'clock when the patient had a crampy abdominal pain, after which, the patient had a bowel movement which was loose and bloody.  Subsequent to that, the patient felt a little bit nauseated, did not throw up.  Denies any chest pain, shortness of breath.  Denies any fever or chills. Denies any dizziness.  The patient has had 2 episodes and the patient transiently lost consciousness and was lying stiff, once in the ER once at the ICU room witnessed by the nurses, both episodes lasted only for 10 seconds.  The patient stated that before the episode started, she felt funny and she did know what happened during the episode.  After the episode, the patient was completely awake, did not have any focal deficit, did not have any tongue bite or incontinence of urine.  Initially, when the patient came, her blood pressure was in  the 80s and the patient got 1 L bolus of fluid, after which, the patient's blood pressure has improved.  The patient will be admitted for acute GI bleed.  PAST MEDICAL HISTORY: 1. History of lupus nephritis and renal cell carcinoma status post     bilateral nephrectomy. 2. History of Crohn disease. 3. History of chronic pancreatitis. 4. History of chronic back pain. 5. History of paroxysmal atrial fibrillation. 6. History of anemia. 7. History of hypothyroidism. 8. History of secondary hyperparathyroidism. 9. History of gastritis. 10.History of peripheral neuropathy. 11.History of anxiety.  PAST SURGICAL HISTORY: 1. Cholecystectomy. 2. Renal transplant. 3. Cesarean section. 4. Bilateral nephrectomy for renal cell carcinoma. 5. History of hemorrhoidectomy. 6. History of eye lens surgery.  MEDICATIONS PRIOR TO ADMISSION:  The patient is on: 1. Tylenol over the counter every 6 hours as needed. 2. Prograf 6 mg twice daily. 3. Procardia XL 60 mg daily. 4. Prilosec 25 daily. 5. Prednisone 7.5 mg daily. 6. Myfortic 180 mg 2 capsules twice daily. 7. MiraLax. 8. Lopressor 25 mg twice daily. 9. Levothyroxine 50 mcg p.o. daily. 10.Gabapentin 100 mg p.o. daily. 11.Calcitriol 0.25 mcg p.o. daily. 12.Aspirin 325 mg p.o. daily. 13.Amiodarone 200 mg p.o. half tablet twice daily.  ALLERGIES:  AZITHROMYCIN, BACTRIM, TETRACYCLINE, AMOXICILLIN, PENICILLIN, LABETALOL, CIPROFLOXACIN.  FAMILY HISTORY:  Negative for any colon cancer.  SOCIAL HISTORY:  The patient is married, lives with her husband.  Denies smoking cigarettes, drinking alcohol, or using illegal drugs.  The patient is a Jehovah witness, refuses any type of blood transfusion even if it is to cause her death.  The patient stated that her hemoglobin once had gone up to 1.9 and even then she has refused transfusion.  REVIEW OF SYSTEMS:  As per history of presenting illness, nothing else significant.  PHYSICAL EXAMINATION:   GENERAL:  The patient examined at bedside, not in acute distress. VITAL SIGNS:  Blood pressure presently 125/60, pulse is 70 per minute, temperature 98.8, respirations 20 per minute, O2 sat 100%. HEENT:  Anicteric.  Mild pallor.  The patient's face looked flushed. There is no facial asymmetry.  Tongue is midline.  No discharge from ears, eyes, nose, or mouth. NECK:  No neck rigidity. CHEST:  Bilateral air entry present.  No rhonchi, no crepitation. HEART:  S1, S2 heard. ABDOMEN:  Soft, nontender.  Bowel sounds present.  There is a right- sided hernia.  No guarding or rigidity. CNS:  Alert, awake; oriented to time, place, and person.  Moves upper and lower extremities 5/5.  PERRLA positive.  No facial asymmetry.  No pronator drift.  Good grip strength. EXTREMITIES:  Peripheral pulses felt.  No acute ischemic changes, clubbing, or abscess.  LABORATORY DATA:  CBC; WBC 11.6, hemoglobin is 9.5, hematocrit is 30, hemoglobin on December 09, 2010, is 11.3, platelets 155.  PT/INR is 15.9 and 1.2.  Complete metabolic panel; sodium 462, potassium 5.1, chloride 101, carbon dioxide 25, glucose 151, BUN 46, creatinine 1.9, that is where creatinine usually is.  Total bilirubin is 0.2, alkaline phosphatase 109, AST 13, ALT 10, total protein 6, albumin 3.6, calcium 10.2, lactic acid 2.2.  ASSESSMENT: 1. Gastrointestinal bleed. 2. Periods of unresponsiveness, questionable seizure. 3. Anemia secondary to gastrointestinal bleed and a history of chronic     kidney disease with a history of renal transplant. 4. History of lupus nephritis and renal cell carcinoma status post     bilateral nephrectomy, is on renal transplant now since 2006, she     is on antirejection medication. 5. Chronic steroid use. 6. History of Crohn disease. 7. History of hypothyroidism. 8. History of hypertension. 9. History of proximal atrial fibrillation. 10.History of chronic pancreatitis. 11.History of chronic  pain.  PLAN: 1. At this time, as the patient was hypotensive and she did have 2 g     difference in her hemoglobin, I am going to admit the patient in     ICU. 2. For her GI bleed, which at this time looks more likely to be lower     GI bleed due to her colonoscopy and EGD done 2 years ago, which at     that time, did not show anything acute except some gastritis, I did     consult Dr. Paulita Fujita who was on call for Dr. Oletta Lamas and Dr. Paulita Fujita     is going to see the patient later.  At this time, we are going to     closely observe in ICU as the patient is a Jehovah witness and     refuses blood transfusion or blood product transfusion for any     reason even if it causes her death.  We will try to minimize her     blood draws.  I  am going to repeat a CBC only in a.m.  At this     time, we will continue with IV fluids.  As the patient does take     some prednisone, I am going to keep the patient on Protonix, though     the most likely source could be lower GI bleed at this time.  I am     going to get a CT abdomen and pelvis and also check a lipase level.     At this time, her GI bleed could also be from her known history of     Crohn disease which could be having exacerbation.  At this time,     her abdomen looks benign. 3. Periods of unresponsiveness.  The patient stated that she did have     history of seizure a few years ago when she was on dialysis.  She     was, at this time, not taking any antiepileptic medications.  At     this time, I am going to get an EEG, keep the patient on seizure     precaution, get a CT head without contrast, and closely observe the     patient for any further seizure-like activities. 4. For her renal transplant, at this time, the patient will be kept     n.p.o. except medications.  Continue with her antirejection     medication and as the patient was on steroids, I am going to keep     the patient on stress-dose steroids at least for quarters of 50 mg      IV cortisone, after which, further doses can be decided. 5. Further recommendation based on the test order and clinical course.     [name], MD     ANK/MEDQ  D:  12/10/2010  T:  12/11/2010  Job:  [id number]  cc:   Jeneen Rinks L. Rolla Flatten., M.D. Dr. Jimmy Footman  Electronically Signed by Gean Birchwood MD on 01/11/2011 09:25:11 AM

## 2011-01-13 ENCOUNTER — Encounter (HOSPITAL_COMMUNITY): Payer: Medicare Other | Attending: Nephrology

## 2011-01-13 ENCOUNTER — Encounter (HOSPITAL_COMMUNITY): Payer: Medicare Other

## 2011-01-13 DIAGNOSIS — Z940 Kidney transplant status: Secondary | ICD-10-CM | POA: Insufficient documentation

## 2011-01-13 DIAGNOSIS — D638 Anemia in other chronic diseases classified elsewhere: Secondary | ICD-10-CM | POA: Insufficient documentation

## 2011-01-13 DIAGNOSIS — N184 Chronic kidney disease, stage 4 (severe): Secondary | ICD-10-CM | POA: Insufficient documentation

## 2011-01-20 ENCOUNTER — Encounter (HOSPITAL_COMMUNITY): Payer: Medicare Other

## 2011-01-20 ENCOUNTER — Other Ambulatory Visit: Payer: Self-pay | Admitting: Nephrology

## 2011-01-20 LAB — IRON AND TIBC
Iron: 30 ug/dL — ABNORMAL LOW (ref 42–135)
Saturation Ratios: 16 % — ABNORMAL LOW (ref 20–55)

## 2011-01-20 LAB — RENAL FUNCTION PANEL
CO2: 27 mEq/L (ref 19–32)
Calcium: 10.4 mg/dL (ref 8.4–10.5)
GFR calc Af Amer: 34 mL/min — ABNORMAL LOW (ref >60)
GFR calc non Af Amer: 28 mL/min — ABNORMAL LOW (ref >60)
Glucose, Bld: 91 mg/dL (ref 70–99)
Phosphorus: 4.1 mg/dL (ref 2.3–4.6)
Potassium: 4.4 mEq/L (ref 3.5–5.1)
Sodium: 137 mEq/L (ref 135–145)

## 2011-01-21 LAB — PTH, INTACT AND CALCIUM
Calcium, Total (PTH): 10.2 mg/dL (ref 8.4–10.5)
PTH: 57 pg/mL (ref 14.0–72.0)

## 2011-01-27 ENCOUNTER — Other Ambulatory Visit: Payer: Self-pay | Admitting: Nephrology

## 2011-01-27 ENCOUNTER — Encounter (HOSPITAL_COMMUNITY): Payer: Medicare Other

## 2011-01-27 LAB — COMPREHENSIVE METABOLIC PANEL
AST: 24
Albumin: 4
BUN: 20
Calcium: 9.6
Creatinine, Ser: 1.39 — ABNORMAL HIGH
GFR calc Af Amer: 46 — ABNORMAL LOW
Total Bilirubin: 0.5
Total Protein: 7.7

## 2011-01-27 LAB — URINALYSIS, ROUTINE W REFLEX MICROSCOPIC
Ketones, ur: 15 — AB
Nitrite: NEGATIVE
Protein, ur: NEGATIVE
Urobilinogen, UA: 0.2

## 2011-01-27 LAB — RENAL FUNCTION PANEL
Albumin: 3.6 g/dL (ref 3.5–5.2)
Chloride: 103 mEq/L (ref 96–112)
GFR calc Af Amer: 37 mL/min — ABNORMAL LOW (ref >60)
Phosphorus: 4.6 mg/dL (ref 2.3–4.6)
Potassium: 4.1 mEq/L (ref 3.5–5.1)
Sodium: 139 mEq/L (ref 135–145)

## 2011-01-27 LAB — URINE CULTURE: Colony Count: 25000

## 2011-01-27 LAB — DIFFERENTIAL
Basophils Absolute: 0
Eosinophils Relative: 1
Lymphocytes Relative: 9 — ABNORMAL LOW
Lymphs Abs: 0.8
Monocytes Absolute: 0.5
Neutro Abs: 8.2 — ABNORMAL HIGH

## 2011-01-27 LAB — CBC
HCT: 31.2 — ABNORMAL LOW
MCHC: 33.7
MCV: 89
Platelets: 113 — ABNORMAL LOW
RDW: 15.3
WBC: 9.6

## 2011-01-27 LAB — URINE MICROSCOPIC-ADD ON

## 2011-02-02 NOTE — Discharge Summary (Signed)
[name], [name]               ACCOUNT NO.:  [id number]  MEDICAL RECORD NO.:  [id number]  LOCATION:  5508                         FACILITY:  [facility]  PHYSICIAN:  Aalina Brege Franco Collet, MD      DATE OF BIRTH:  [birth year]  DATE OF ADMISSION:  12/10/2010 DATE OF DISCHARGE:  12/23/2010                        DISCHARGE SUMMARY - REFERRING   NEPHROLOGIST:  Dr. Jeneen Rinks Deterding.  PRIMARY GASTROENTEROLOGIST:  Dr. Laurence Spates.  DISCHARGE DIAGNOSES: 1. Lower gastrointestinal bleed, suspected diverticular. 2. Profound anemia, acute blood loss secondary to above and with     chronic disease. 3. Acute left lower extremity deep vein thrombosis, status post IVC     filter. 4. History of Crohn disease. 5. Paroxysmal atrial fibrillation. 6. Hypothyroidism. 7. Hypertension. 8. Chronic kidney disease. 9. History of lupus nephritis and renal cell carcinoma, status post     bilateral nephrectomy.  CONSULTATIONS:  During hospitalization:1. [facility]. 2. Dr. Arta Silence with [facility]. 3. Dr. Titus Mould with [facility].  DIAGNOSTICS:  During hospitalization: 1. CT of the head performed on August 04 showing no acute findings. 2. CT of the abdomen and pelvis performed August 04 with no acute     findings. 3. Nuclear med scan performed August 6 with no identified site of GI     bleeding. 4. Chest x-ray performed August 8 showing mild pulmonary vascular     congestion without any edema.  PROCEDURES:  During hospitalization: 1. EEG performed August 06 within normal limits. 2. Flexible sigmoidoscopy performed August 04 by Dr. Paulita Fujita     demonstrating old and fresh blood in the distal colon.  No obvious     internal hemorrhoids seen.  Bleeding suspected to be diverticular     in nature. 3. EGD performed August 07 by Dr. Paulita Fujita with no source of blood     identified. 4. Repeat flexible sigmoidoscopy performed August 07 showing large     volume of  old and fresh blood in rectosigmoid and descending colon     with left colonic diverticula.  No discrete bleeding source was     identified. 5. IVC filter placement on December 17, 2010, per Interventional     Radiology.  BRIEF HISTORY OF PRESENT ILLNESS:  [name] is a 70 year old female with multiple medical problems who presented to the emergency department on the day of admission with complaints of bright red blood per rectum. Upon arrival to the emergency department, the patient was found be hypotensive with systolic blood pressure in the 80s and with slightly altered mentation.  The patient's blood pressure improved with administration of IV fluids held over given the patient's continued bleeding and soft blood pressure.  The patient was admitted to the unit for further evaluation and treatment.  COURSE OF HOSPITALIZATION: 1. Lower gastrointestinal bleed, suspected diverticular.  Again, the     patient was admitted to the unit with soft blood pressure and     critically low hemoglobin.  The patient did not require any     pressors, in addition respiratory status remained stable not     requiring any intubation or respiratory support.  The patient     underwent  flexible sigmoidoscopy as well as EGD with results as     mentioned above.  At this point, the patient's bleeding felt to be     diverticular in nature.  The patient has not experienced any     recurrent melena or hematochezia greater than 48 hours prior to     disposition.  We will continue to hold the patient's aspirin for     now until follow-up with Renal as discussed below. 2. Profound anemia.  Acute blood loss with lower gastrointestinal     bleed and with chronic disease.  The patient refused any     transfusion of packed red blood cells during this admission as she     is a Jehovah's Witness.  Her hemoglobin has dropped as low as 4.9.     During hospitalization, the patient has received IV iron as well as      Aranesp and vitamin B12.  Again, the patient has not experienced     any recurrent bleeding.  The patient's hemoglobin has slowly     trended up; however, it is still quite low at 5.6.  The patient has     been set up to receive Procrit injections at [facility] by     her nephrologist.  The patient instructed to call Short Stay next     week to arrange injections.  The patient verifies understanding of     these orders.  Again, the patient has refused blood cell     transfusion secondary to religious belief. 3. Acute deep vein thrombosis, left lower extremity.  During     hospitalization, the patient did have some left lower extremity     thigh swelling.  She was found to have a deep vein thrombosis at     the site of prior vascular access for dialysis.  Doppler studies     showed thrombosed left common femoral as well as thrombosed left     femoral vein appearing to be chronic in nature.  Due to the     patient's admission for acute GI bleeding, she is not     anticoagulation candidate.  The patient did have IVC filter placed     on August 10 without any complication. 4. History of Crohn disease.  Remained stable throughout     hospitalization. 5. Paroxysmal atrial fibrillation.  The patient on beta blocker and     amiodarone prior to this admission held on admit due to soft     systolic blood pressure.  Both medications have been resumed at     this time and the patient is tolerating well. 6. Chronic kidney disease, status post renal transplant.  The patient     continued on Prograf and Myfortic as well as chronic steroids     during this admission.  Initially, the patient was placed on stress     dose of IV Solu-Medrol as she has been tapered to home dose of     prednisone at this time.  DISCHARGE MEDICATIONS: 1. Vitamin B12 100 mcg p.o. daily. 2. Folic acid 2 mg p.o. daily. 3. New iron 150 mg p.o. b.i.d. 4. Ultram 50 mg p.o. every 6 hours p.r.n. pain. 5. Amiodarone 200 mg  tab half tablet p.o. b.i.d. 6. Calcitriol 0.25 mcg p.o. daily. 7. Gabapentin 100 mg p.o. at bedtime. 8. Levothyroxine 50 mcg p.o. daily. 9. Lopressor 25 mg p.o. b.i.d. 10.MiraLax 17 g p.o. t.i.d. p.r.n. constipation. 11.Myfortic 180 mg 2 capsules  p.o. b.i.d. 12.Prednisone 5 mg tab 1-1/2 tablets p.o. daily. 13.Prilosec 20 mg p.o. daily. 14.Procardia XL 60 mg p.o. daily. 15.Prograf 6 mg p.o. b.i.d. 16.The patient instructed to hold aspirin therapy for now until follow-     up with Renal next month. 17.Procrit injections to be received in short stay as arranged by     Nephrology.  PERTINENT LAB FINDINGS:  At the time of discharge, hemoglobin 5.6 up from 4.9 on December 18, 2010, hematocrit 18.5, sodium 134, potassium 4.5, BUN 22, creatinine 2.01.  Anemia panel shows vitamin B12 at 296, folate 8.0, ferritin 573, iron level 123, TIBC 210.  DISPOSITION:  The patient is felt medically stable for discharge home at this time and she will not receive any blood cell transfusions given religious beliefs.  Despite low hemoglobin, the patient is asymptomatic. At this time, the patient instructed to return to the emergency department for any recurrent bleeding, chest pain or shortness of breath.  The patient is instructed to follow up with Dr. Jimmy Footman on September 24 as previously scheduled.  In addition, the patient is instructed to call [facility] at the time of discharge to arrange Procrit injections.  The patient has verified understanding of all discharge instructions.     Patrici Ranks, NP   ______________________________ Donn Pierini, MD    LE/MEDQ  D:  12/23/2010  T:  12/23/2010  Job:  [id number]  cc:   Jeneen Rinks L. Deterding, M.D. Arta Silence, MD Joyice Faster. Rolla Flatten., M.D.  Electronically Signed by Patrici Ranks NP on 01/07/2011 03:15:43 PM Electronically Signed by Donia Ast MD on 02/02/2011 10:33:05 AM

## 2011-02-03 ENCOUNTER — Encounter (HOSPITAL_COMMUNITY): Payer: Medicare Other

## 2011-02-03 ENCOUNTER — Other Ambulatory Visit: Payer: Self-pay | Admitting: Nephrology

## 2011-02-03 NOTE — Progress Notes (Signed)
NAMEMarland Mcfarland  [name], [name] NO.:  [id number]  MEDICAL RECORD NO.:  [id number]  LOCATION:  2927                         FACILITY:  [facility]  PHYSICIAN:  Cherene Altes, M.D.DATE OF BIRTH:  08-02-1940                                PROGRESS NOTE   ADMITTING PHYSICIAN: Amanda Patience, MD  PRIMARY CARE PHYSICIAN/NEPHROLOGIST: Dr. Jimmy Footman.  PRIMARY GASTROENTEROLOGIST: [name]. Amanda Lamas, MD  CONSULTANTS THIS ADMISSION: 1. Dr. Mercy Mcfarland, with Renal. 2. Dr. Arta Mcfarland, with Gastroenterology. 3. Dr. Titus Mcfarland, with Pulmonary Critical Care Medicine.  CHIEF COMPLAINT: [name] is a 70 year old female patient with multiple medical problems, who presents to the ER with complaints of bright red blood per rectum, multiple episodes over the few hours prior to presentation. This was associated with crampy abdominal pain and nausea, but no emesis.  No other symptoms were endorsed by the patient.  After arrival to the ER, the patient has had 2 or more episodes of significant bleeding with altered mentation, was found to be difficult to arouse and lying somewhat stiff like, this episode lasted about 10 seconds at that time.  Blood pressure was not obtained.  The patient was not aware of these episodes.  It did not appear as if she was having actual seizure activity.  The patient did present to the ER hypotensive with a blood pressure in the 80s and got 1 L of fluid and subsequently her blood pressure has improved.  Upon Dr. Moise Boring examination, the patient was not in any acute distress.  Her temperature was 98.8, BP 125/60, pulse 70 and regular, respirations 20, O2 saturations 100%.  General exam, the patient to be mildly pallor.  By examining her conjunctiva, physical exam was otherwise unremarkable.  Peripheral pulses were palpable.  ADMITTING LABORATORY DATA: White count 11,600, hemoglobin 9.5, hematocrit 30.  Hemoglobin 24 hours prior was 11.1.   Platelets count 155,000.  PT/INR were normal.  Sodium 135, potassium 5.1, chloride 101, CO2 of 25, glucose 151, BUN 46, creatinine 1.9 which is at her baseline, total bilirubin normal at 0.2, alkaline phosphatase 109, AST 13, ALT 10, calcium 10.2.  Lactic acid 2.2.  PAST MEDICAL HISTORY: 1. History of lupus nephritis and renal cell carcinoma, status post     bilateral nephrectomy. 2. Status post cadaveric renal transplant in 2006. 3. Crohn disease followed by Dr. Oletta Mcfarland. 4. Chronic pancreatitis. 5. Chronic back pain. 6. Paroxysmal atrial fibrillation, not on Coumadin. 7. Anemia of chronic disease. 8. Hypothyroidism. 9. Secondary hyperparathyroidism. 10.Gastritis. 11.Hypertension. 12.Peripheral neuropathy. 13.Anxiety disorder.  ADMITTING DIAGNOSES.: 1. Bright red blood per rectum/acute lower GI bleeding, etiology     unclear and the patient with known Crohn disease. 2. Altered mentation with question of possible seizure activity. 3. Acute blood loss anemia most likely related to gastrointestinal     bleeding in setting of the patient's chronic anemia due to renal     disease. 4. History of lupus with nephritis as well as prior renal cell     carcinoma status post bilateral nephrectomy, renal transplant in     2006, on immunosuppressive medications chronically.  5.  Crohn     disease. 5. Hypothyroidism. 6. Hypertension with relative hypotension.  7. Paroxysmal atrial fibrillation maintaining sinus rhythm. 8. Chronic pancreatitis, currently quiescent. 9. Chronic pain.  DIAGNOSTICS: 1. CT of the head without contrast on August 4 shows no acute     intracranial abnormality. 2. CT of the abdomen and pelvis on August 4 without contrast shows no     acute findings in the abdomen and pelvis. 3. Nuclear medicine, GI blood loss scan on August 6 shows site of GI     bleeding not identified on this exam. 4. Portable chest x-ray on August 8 that shows mild pulmonary vascular      congestion without edema.  Retrocardiac density likely representing     atelectasis.  PROCEDURES: 1. EEG on August 6 is normal. 2. Flexible sigmoidoscopy on August 4 by Dr. Paulita Mcfarland, that demonstrated     old and fresh blood in the distal colon.  The dentate line was     crisp, no obvious internal hemorrhoids seen.  External hemorrhoids     would not be expected the cause blood throughout the this colon.     Overall it is suspected that the bleeding is diverticular in     nature. 3. EGD on August 7 by Dr. Paulita Mcfarland, that shows normal endoscopy, no old     or fresh blood seen.  No source of blood in stool identified. 4. Flexible sigmoidoscopy on August 7 which shows large volume of old     and fresh blood in the rectosigmoid and descending colon.  Left     colonic diverticula.  Overall strongly suspect this is diverticular     bleed, but no discrete bleeding source was identified.  LABORATORY DATA: The patient presented on August 3 with a hemoglobin of 9.5 and 24 hours prior to this her hemoglobin had been 11.3 which was at her baseline, nadir for hemoglobin this admission has been 4.9 and as of today August 13 her hemoglobin is up to 15.3 with a white count of 14,000, hematocrit 17.5, platelets 181,000, RDW 18, and MCV 94.6.  MRSA PCR screening was negative.  Cardiac isoenzymes were cycled x3 without evidence of ischemia.  Serum lipase was 14 and anemia panel, B12 296, serum folate 8, ferritin 573 and most recent basic metabolic panel on August 12, sodium 138, potassium 4.0, chloride 103, CO2 of 26, glucose 100, BUN 19, creatinine 1.94.  HOSPITAL COURSE: 1. Acute blood loss anemia with associated lower GI bleeding.  The     patient presented with bright red blood per rectum.  Because of her     religious beliefs she was not a candidate for packed red blood     cells.  Due to the severity of her anemia and associated     hypotension, Pulmonary Critical Care Medicine was consulted..   The     patient remained relatively stable from a hemodynamic standpoint     and did not require pressors and her respiratory status remained     stable and she did not require intubation or other invasive     respiratory support.  Therefore, Pulmonary Critical Care Medicine     signed off.  Hemoglobin nadir 4.9 this admission.  She has been     given IV iron, vitamin B12, and Aranesp this admission.  On Sunday,     August 12, the patient did endorse that in the previous 24 hours     she had passed maroon stools but over the past 24 hours she has not     had any  further maroon stools and given the fact that her     hemoglobin is actually trending upward we feel she is stable from a     GI bleeding standpoint. 2. Acute DVT, left lower extremity.  The patient was found to have a     DVT in the left lower extremity at the same site of her prior     vascular access for dialysis.  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  Due to her recent issues with lower GI bleeding,     anticoagulation was deferred and an IVC filter was placed.  There     was some concerns about possibly Aranesp contributing to the     thrombotic status, especially in current setting.  This was     discussed with the nephrologist.  He reviewed data to confirm DVT     with erythropoietin is typically correlated with rapid hemoglobin     correction and/or relative polycythemia both of which are     not concerns for this patient at this time. 3. Crohn disease.  The patient is chronically on steroids.  She denies     symptoms consistent with acute flare although today she was     complaining of a little bit of cramping and requested resume her     chronic mesalamine rectal suppository which we will do. 4. Paroxysmal atrial fibrillation.  The patient was chronically on     Lopressor and  amiodarone prior to admission due to systolic blood     pressures amiodarone alone was resumed on December 16, 2010, Lopressor     remains on hold.  Blood pressure is slowly increasing concurrently     with increasing hemoglobin.  So consider reintroduction of this     medication within the next 24 hours. 5. Hypothyroidism.  TSH was not checked this admission, the patient     remains on Synthroid. 6. Hypertension.  The patient actually has a relative hypotension this     admission and had true hypotension early in the admission.  Blood     pressure is still somewhat soft, but markedly improving.  Lopressor     and Procardia remain on hold.  Consider a slow reintroduction soon. 7. 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  The patient has been     continued on Prograf, Myfortic, and prednisone this admission.     Initially, she was on IV Solu-Medrol which has been tapered.  She     is now on her usual home dose of prednisone 10 mg daily. 8. Hypokalemia, this has been a transient problem and has resolved     with oral repletion.  DISPOSITION: At the present time, the patient is appropriate to transfer to a non- telemetry floor.  Please note that time the patient will not receive packed red blood cells due to her religious beliefs related to being Jehov[employer] Witness.  She is slowly improving.  She is somewhat deconditioned and is currently able to tolerate minimal activity level without oxygen, although we encourage use of oxygen during the acute anemic period.     [facility] Lissa Merlin, N.P.   ______________________________ Cherene Altes, M.D.    ALE/MEDQ  D:  12/20/2010  T:  12/20/2010  Job:  [id number]  Electronically Signed by Erin Hearing N.P. on 12/23/2010 09:01:46 AM Electronically Signed by Joette Catching M.D. on 02/03/2011 09:50:21 AM

## 2011-02-04 LAB — POCT HEMOGLOBIN-HEMACUE: Hemoglobin: 12.4 g/dL (ref 12.0–15.0)

## 2011-02-08 LAB — COMPREHENSIVE METABOLIC PANEL
Albumin: 4.7
BUN: 18
Calcium: 10
Glucose, Bld: 108 — ABNORMAL HIGH
Sodium: 137
Total Protein: 8.2

## 2011-02-08 LAB — URINALYSIS, ROUTINE W REFLEX MICROSCOPIC
Bilirubin Urine: NEGATIVE
Glucose, UA: NEGATIVE
Hgb urine dipstick: NEGATIVE
Specific Gravity, Urine: 1.018
Urobilinogen, UA: 0.2
pH: 5.5

## 2011-02-08 LAB — DIFFERENTIAL
Lymphs Abs: 1.4
Monocytes Absolute: 0.8
Monocytes Relative: 8
Neutro Abs: 7.9 — ABNORMAL HIGH
Neutrophils Relative %: 76

## 2011-02-08 LAB — POCT HEMOGLOBIN-HEMACUE: Hemoglobin: 11.4 — ABNORMAL LOW

## 2011-02-08 LAB — CBC
HCT: 37.5
Hemoglobin: 12.4
MCHC: 33
Platelets: 184
RDW: 15.5

## 2011-02-08 LAB — IRON AND TIBC
Iron: 60
TIBC: 231 — ABNORMAL LOW
UIBC: 171

## 2011-02-09 LAB — URINALYSIS, ROUTINE W REFLEX MICROSCOPIC
Glucose, UA: NEGATIVE
Leukocytes, UA: NEGATIVE
pH: 7

## 2011-02-09 LAB — CBC
MCHC: 32.3
MCV: 93.2
RBC: 3.91
RBC: 4.49
RDW: 16.2 — ABNORMAL HIGH
WBC: 8.1

## 2011-02-09 LAB — URINE MICROSCOPIC-ADD ON

## 2011-02-09 LAB — RENAL FUNCTION PANEL
Albumin: 3.3 — ABNORMAL LOW
Glucose, Bld: 84
Phosphorus: 3.5
Potassium: 4.2
Sodium: 137

## 2011-02-09 LAB — IRON AND TIBC
Iron: 76
Saturation Ratios: 35
TIBC: 217 — ABNORMAL LOW
UIBC: 141

## 2011-02-09 LAB — LIPASE, BLOOD: Lipase: 16

## 2011-02-09 LAB — COMPREHENSIVE METABOLIC PANEL
AST: 36
CO2: 21
Calcium: 9
Creatinine, Ser: 1.55 — ABNORMAL HIGH
GFR calc Af Amer: 41 — ABNORMAL LOW
GFR calc non Af Amer: 34 — ABNORMAL LOW
Total Protein: 7

## 2011-02-09 LAB — POCT HEMOGLOBIN-HEMACUE: Hemoglobin: 11.8 — ABNORMAL LOW

## 2011-02-11 LAB — COMPREHENSIVE METABOLIC PANEL
ALT: 17 U/L (ref 0–35)
ALT: 17 U/L (ref 0–35)
AST: 17 U/L (ref 0–37)
AST: 30 U/L (ref 0–37)
AST: 34 U/L (ref 0–37)
Albumin: 3.7 g/dL (ref 3.5–5.2)
Alkaline Phosphatase: 145 U/L — ABNORMAL HIGH (ref 39–117)
CO2: 26 mEq/L (ref 19–32)
CO2: 27 mEq/L (ref 19–32)
Calcium: 9.5 mg/dL (ref 8.4–10.5)
Chloride: 100 mEq/L (ref 96–112)
Chloride: 100 mEq/L (ref 96–112)
Chloride: 99 mEq/L (ref 96–112)
Creatinine, Ser: 1.39 mg/dL — ABNORMAL HIGH (ref 0.4–1.2)
Creatinine, Ser: 1.4 mg/dL — ABNORMAL HIGH (ref 0.4–1.2)
GFR calc Af Amer: 43 mL/min — ABNORMAL LOW (ref >60)
GFR calc Af Amer: 46 mL/min — ABNORMAL LOW (ref >60)
GFR calc Af Amer: 46 mL/min — ABNORMAL LOW (ref >60)
GFR calc non Af Amer: 36 mL/min — ABNORMAL LOW (ref >60)
GFR calc non Af Amer: 38 mL/min — ABNORMAL LOW (ref >60)
Potassium: 3.3 mEq/L — ABNORMAL LOW (ref 3.5–5.1)
Potassium: 3.7 mEq/L (ref 3.5–5.1)
Sodium: 134 mEq/L — ABNORMAL LOW (ref 135–145)
Total Bilirubin: 0.7 mg/dL (ref 0.3–1.2)
Total Bilirubin: 0.8 mg/dL (ref 0.3–1.2)
Total Bilirubin: 1.1 mg/dL (ref 0.3–1.2)
Total Protein: 6.8 g/dL (ref 6.0–8.3)

## 2011-02-11 LAB — CBC
HCT: 34.7 % — ABNORMAL LOW (ref 36.0–46.0)
Hemoglobin: 11.2 g/dL — ABNORMAL LOW (ref 12.0–15.0)
MCHC: 32.2 g/dL (ref 30.0–36.0)
MCV: 94.2 fL (ref 78.0–100.0)
MCV: 94.7 fL (ref 78.0–100.0)
Platelets: 100 10*3/uL — ABNORMAL LOW (ref 150–400)
RBC: 3.67 MIL/uL — ABNORMAL LOW (ref 3.87–5.11)
RBC: 3.95 MIL/uL (ref 3.87–5.11)
RBC: 4.05 MIL/uL (ref 3.87–5.11)
WBC: 5.3 10*3/uL (ref 4.0–10.5)
WBC: 8.5 10*3/uL (ref 4.0–10.5)
WBC: 8.6 10*3/uL (ref 4.0–10.5)

## 2011-02-11 LAB — UIFE/LIGHT CHAINS/TP QN, 24-HR UR
Albumin, U: DETECTED
Alpha 1, Urine: DETECTED — AB
Beta, Urine: DETECTED — AB
Free Lambda Lt Chains,Ur: 0.4 mg/dL (ref 0.08–1.01)
Total Protein, Urine: 5 mg/dL

## 2011-02-11 LAB — BASIC METABOLIC PANEL
CO2: 22 mEq/L (ref 19–32)
CO2: 25 mEq/L (ref 19–32)
Chloride: 100 mEq/L (ref 96–112)
Chloride: 101 mEq/L (ref 96–112)
Creatinine, Ser: 1.44 mg/dL — ABNORMAL HIGH (ref 0.4–1.2)
GFR calc Af Amer: 44 mL/min — ABNORMAL LOW (ref >60)
Glucose, Bld: 97 mg/dL (ref 70–99)
Potassium: 3.8 mEq/L (ref 3.5–5.1)
Potassium: 3.9 mEq/L (ref 3.5–5.1)
Sodium: 135 mEq/L (ref 135–145)

## 2011-02-11 LAB — CULTURE, BLOOD (ROUTINE X 2)

## 2011-02-11 LAB — PROTEIN ELECTROPHORESIS, SERUM
Albumin ELP: 58 % (ref 55.8–66.1)
Alpha-1-Globulin: 3.3 % (ref 2.9–4.9)
Alpha-2-Globulin: 8.9 % (ref 7.1–11.8)
Total Protein ELP: 8 g/dL (ref 6.0–8.3)

## 2011-02-11 LAB — CLOSTRIDIUM DIFFICILE EIA

## 2011-02-11 LAB — URINE MICROSCOPIC-ADD ON

## 2011-02-11 LAB — IRON AND TIBC: TIBC: 220 ug/dL — ABNORMAL LOW (ref 250–470)

## 2011-02-11 LAB — URINALYSIS, ROUTINE W REFLEX MICROSCOPIC
Bilirubin Urine: NEGATIVE
Ketones, ur: NEGATIVE mg/dL
Nitrite: NEGATIVE
Urobilinogen, UA: 0.2 mg/dL (ref 0.0–1.0)

## 2011-02-11 LAB — RETICULOCYTES: Retic Ct Pct: 0.9 % (ref 0.4–3.1)

## 2011-02-11 LAB — CORTISOL: Cortisol, Plasma: 9.7 ug/dL

## 2011-02-17 ENCOUNTER — Other Ambulatory Visit: Payer: Self-pay | Admitting: Nephrology

## 2011-02-17 ENCOUNTER — Encounter (HOSPITAL_COMMUNITY)
Admission: RE | Admit: 2011-02-17 | Discharge: 2011-02-17 | Disposition: A | Discharge status: Home / Self Care | Payer: Medicare Other | Source: Ambulatory Visit | Attending: Nephrology | Admitting: Nephrology

## 2011-02-17 DIAGNOSIS — N184 Chronic kidney disease, stage 4 (severe): Secondary | ICD-10-CM | POA: Insufficient documentation

## 2011-02-17 DIAGNOSIS — Z940 Kidney transplant status: Secondary | ICD-10-CM | POA: Insufficient documentation

## 2011-02-17 DIAGNOSIS — D638 Anemia in other chronic diseases classified elsewhere: Secondary | ICD-10-CM | POA: Insufficient documentation

## 2011-02-17 LAB — RENAL FUNCTION PANEL
CO2: 26 mEq/L (ref 19–32)
Calcium: 10 mg/dL (ref 8.4–10.5)
GFR calc Af Amer: 37 mL/min — ABNORMAL LOW (ref >90)
GFR calc non Af Amer: 32 mL/min — ABNORMAL LOW (ref >90)
Glucose, Bld: 97 mg/dL (ref 70–99)
Potassium: 4.4 mEq/L (ref 3.5–5.1)
Sodium: 138 mEq/L (ref 135–145)

## 2011-02-17 LAB — FERRITIN: Ferritin: 1101 ng/mL — ABNORMAL HIGH (ref 10–291)

## 2011-02-17 LAB — IRON AND TIBC: TIBC: 211 ug/dL — ABNORMAL LOW (ref 250–470)

## 2011-02-17 LAB — POCT HEMOGLOBIN-HEMACUE: Hemoglobin: 12.2 g/dL (ref 12.0–15.0)

## 2011-02-18 LAB — CBC
HCT: 42.7
Hemoglobin: 14
MCHC: 32.7
MCV: 91.7
Platelets: 129 — ABNORMAL LOW
RBC: 4.66
RDW: 14.8 — ABNORMAL HIGH
WBC: 8.3

## 2011-02-18 LAB — PTH, INTACT AND CALCIUM: Calcium, Total (PTH): 9.8 mg/dL (ref 8.4–10.5)

## 2011-02-18 LAB — FERRITIN: Ferritin: 1117 — ABNORMAL HIGH (ref 10–291)

## 2011-02-18 LAB — IRON AND TIBC
Iron: 191 — ABNORMAL HIGH
Saturation Ratios: 55
TIBC: 349
UIBC: 158

## 2011-02-21 LAB — CBC
HCT: 38.1
HCT: 35.7 — ABNORMAL LOW
Hemoglobin: 12.5
Hemoglobin: 11.7 — ABNORMAL LOW
MCHC: 32.9
MCHC: 32.8
MCV: 92.3
MCV: 90.6
Platelets: 121 — ABNORMAL LOW
Platelets: 117 — ABNORMAL LOW
RBC: 4.13
RBC: 3.95
RDW: 14.6 — ABNORMAL HIGH
RDW: 14.7 — ABNORMAL HIGH
WBC: 8.4
WBC: 8.2

## 2011-02-21 LAB — IRON AND TIBC
Iron: 208 — ABNORMAL HIGH
Iron: 41 — ABNORMAL LOW
Saturation Ratios: 70 — ABNORMAL HIGH
Saturation Ratios: 8 — ABNORMAL LOW
TIBC: 296
TIBC: 535 — ABNORMAL HIGH
UIBC: 88
UIBC: 494

## 2011-02-21 LAB — PROTIME-INR
INR: 1.3
Prothrombin Time: 16.6 — ABNORMAL HIGH

## 2011-02-24 LAB — PTH, INTACT AND CALCIUM
Calcium, Total (PTH): 9.2
PTH: 72.3 — ABNORMAL HIGH

## 2011-02-24 LAB — IRON AND TIBC
Iron: 105
Saturation Ratios: 55
TIBC: 190 — ABNORMAL LOW
UIBC: 85

## 2011-02-24 LAB — CBC
HCT: 31.4 — ABNORMAL LOW
Hemoglobin: 10.3 — ABNORMAL LOW
MCHC: 33
MCV: 88.7
Platelets: 118 — ABNORMAL LOW
RBC: 3.54 — ABNORMAL LOW
RDW: 15.5 — ABNORMAL HIGH
WBC: 7.3

## 2011-02-24 LAB — FERRITIN: Ferritin: 1237 — ABNORMAL HIGH (ref 10–291)

## 2011-03-10 ENCOUNTER — Other Ambulatory Visit: Payer: Self-pay | Admitting: Nephrology

## 2011-03-10 ENCOUNTER — Encounter (HOSPITAL_COMMUNITY): Payer: Medicare Other

## 2011-03-10 DIAGNOSIS — D638 Anemia in other chronic diseases classified elsewhere: Secondary | ICD-10-CM | POA: Insufficient documentation

## 2011-03-10 DIAGNOSIS — Z940 Kidney transplant status: Secondary | ICD-10-CM | POA: Insufficient documentation

## 2011-03-10 DIAGNOSIS — N184 Chronic kidney disease, stage 4 (severe): Secondary | ICD-10-CM | POA: Insufficient documentation

## 2011-03-22 ENCOUNTER — Other Ambulatory Visit (HOSPITAL_COMMUNITY): Payer: Self-pay | Admitting: *Deleted

## 2011-03-24 ENCOUNTER — Encounter (HOSPITAL_COMMUNITY): Payer: Medicare Other

## 2011-03-24 ENCOUNTER — Encounter (HOSPITAL_COMMUNITY)
Admission: RE | Admit: 2011-03-24 | Discharge: 2011-03-24 | Disposition: A | Discharge status: Home / Self Care | Payer: Medicare Other | Source: Ambulatory Visit | Attending: Nephrology | Admitting: Nephrology

## 2011-03-24 LAB — IRON AND TIBC
Iron: 128 ug/dL (ref 42–135)
TIBC: 225 ug/dL — ABNORMAL LOW (ref 250–470)
UIBC: 97 ug/dL — ABNORMAL LOW (ref 125–400)

## 2011-03-24 LAB — RENAL FUNCTION PANEL
Albumin: 3.7 g/dL (ref 3.5–5.2)
BUN: 32 mg/dL — ABNORMAL HIGH (ref 6–23)
Chloride: 106 mEq/L (ref 96–112)
GFR calc Af Amer: 35 mL/min — ABNORMAL LOW (ref >90)
Glucose, Bld: 108 mg/dL — ABNORMAL HIGH (ref 70–99)
Potassium: 4.5 mEq/L (ref 3.5–5.1)

## 2011-03-24 LAB — FERRITIN: Ferritin: 969 ng/mL — ABNORMAL HIGH (ref 10–291)

## 2011-03-24 MED ORDER — EPOETIN ALFA 40000 UNIT/ML IJ SOLN
INTRAMUSCULAR | Status: AC
  Administered 2011-03-24: 40000 [IU]
  Filled 2011-03-24: qty 1

## 2011-03-24 MED ORDER — EPOETIN ALFA 10000 UNIT/ML IJ SOLN: 40000.0000 [IU] | INTRAMUSCULAR | Status: DC

## 2011-04-14 ENCOUNTER — Encounter (HOSPITAL_COMMUNITY): Payer: Medicare Other

## 2011-04-14 ENCOUNTER — Emergency Department (HOSPITAL_BASED_OUTPATIENT_CLINIC_OR_DEPARTMENT_OTHER)
Admission: EM | Admit: 2011-04-14 | Discharge: 2011-04-14 | Disposition: A | Discharge status: Home / Self Care | Payer: Medicare Other | Attending: Emergency Medicine | Admitting: Emergency Medicine

## 2011-04-14 ENCOUNTER — Emergency Department (INDEPENDENT_AMBULATORY_CARE_PROVIDER_SITE_OTHER): Payer: Medicare Other

## 2011-04-14 ENCOUNTER — Encounter (HOSPITAL_BASED_OUTPATIENT_CLINIC_OR_DEPARTMENT_OTHER): Payer: Self-pay | Admitting: Emergency Medicine

## 2011-04-14 DIAGNOSIS — M25551 Pain in right hip: Secondary | ICD-10-CM

## 2011-04-14 DIAGNOSIS — M25559 Pain in unspecified hip: Secondary | ICD-10-CM | POA: Insufficient documentation

## 2011-04-14 DIAGNOSIS — K219 Gastro-esophageal reflux disease without esophagitis: Secondary | ICD-10-CM | POA: Insufficient documentation

## 2011-04-14 DIAGNOSIS — I10 Essential (primary) hypertension: Secondary | ICD-10-CM | POA: Insufficient documentation

## 2011-04-14 DIAGNOSIS — E079 Disorder of thyroid, unspecified: Secondary | ICD-10-CM | POA: Insufficient documentation

## 2011-04-14 DIAGNOSIS — Z8739 Personal history of other diseases of the musculoskeletal system and connective tissue: Secondary | ICD-10-CM | POA: Insufficient documentation

## 2011-04-14 DIAGNOSIS — K5090 Crohn's disease, unspecified, without complications: Secondary | ICD-10-CM | POA: Insufficient documentation

## 2011-04-14 MED ORDER — HYDROCODONE-ACETAMINOPHEN 5-325 MG PO TABS: 1.0000 | ORAL_TABLET | Freq: Four times a day (QID) | ORAL | Status: AC | PRN

## 2011-04-14 NOTE — ED Notes (Signed)
Pt c/o right hip pain after bending over yesterday and feeling a popping sensation in hip.

## 2011-04-14 NOTE — ED Provider Notes (Signed)
History     CSN: [id number] Arrival date & time: 04/14/2011  5:56 AM   First MD Initiated Contact with Patient 04/14/11 [phone]      Chief Complaint  Patient presents with  . Hip Pain    (Consider location/radiation/quality/duration/timing/severity/associated sxs/prior treatment) HPI Comments: Pt was bending over yesterday and felt a popping sensation in her hip.  She did not fall.  She denies back pain, numbness or weakness.  Patient is a 70 y.o. female presenting with hip pain. The history is provided by the patient.  Hip Pain This is a new problem. The current episode started yesterday. The problem occurs constantly. Pertinent negatives include no abdominal pain. The symptoms are aggravated by walking. The symptoms are relieved by rest.    Past Medical History  Diagnosis Date  . Crohn's disease   . Hypertension   . Arthritis   . Renal disorder   . History of kidney transplant   . Thyroid disease   . Reflux     Past Surgical History  Procedure Date  . Kidney transplant   . Cholecystectomy   . Cesarean section   . Nephrectomy transplanted organ   . Eye surgery   . Hemorrhoidectomy     No family history on file.  History  Substance Use Topics  . Smoking status: Never Smoker   . Smokeless tobacco: Not on file  . Alcohol Use: No    OB History    Grav Para Term Preterm Abortions TAB SAB Ect Mult Living                  Review of Systems  Constitutional: Negative for fever.  Gastrointestinal: Negative for abdominal pain.  Genitourinary: Negative for dysuria.  All other systems reviewed and are negative.    Allergies  Amoxicillin; Ampicillin; Ciprofloxacin; Erythromycin; Sulfamethoxazole w/trimethoprim; and Tetracycline  Home Medications  No current outpatient prescriptions on file.  BP 115/92  Pulse 75  Temp(Src) 97.9 F (36.6 C) (Oral)  Resp 18  SpO2 99%  Physical Exam  Nursing note and vitals reviewed. Constitutional: She appears  well-developed and well-nourished. No distress.  HENT:  Head: Normocephalic and atraumatic.  Right Ear: External ear normal.  Left Ear: External ear normal.  Eyes: Conjunctivae are normal. Right eye exhibits no discharge. Left eye exhibits no discharge. No scleral icterus.  Neck: Neck supple. No tracheal deviation present.  Cardiovascular: Normal rate.   Pulmonary/Chest: Effort normal. No stridor. No respiratory distress.  Musculoskeletal: She exhibits tenderness. She exhibits no edema.       Right hip: She exhibits tenderness and bony tenderness. She exhibits normal strength, no swelling and no deformity.       Right knee: Normal.       Lumbar back: Normal.  Neurological: She is alert. Cranial nerve deficit: no gross deficits.  Skin: Skin is warm and dry. No rash noted.  Psychiatric: She has a normal mood and affect.    ED Course  Procedures (including critical care time)  Labs Reviewed - No data to display Dg Hip Complete Right  04/14/2011  *RADIOLOGY REPORT*  Clinical Data: Right hip pain.  RIGHT HIP - COMPLETE 2+ VIEW  Comparison: Right hip radiographs performed 06/28/2010  Findings: There is no evidence of fracture or dislocation.  Both femoral heads are seated normally within their respective acetabula.  Mild degenerative change is again noted about the right femoral head, slightly more prominent than on the left.  Sclerotic change is seen along the sacroiliac joints.  The visualized bowel gas pattern is grossly unremarkable in appearance.  Scattered clips and an IVC filter are noted at the lower abdomen. Clips are also noted at the left inguinal region. Scattered vascular calcifications are seen.  IMPRESSION:  1.  No definite evidence of fracture or dislocation. 2.  Mild degenerative change at the hips, slightly worse on the right.  Original Report Authenticated By: Santa Lighter, M.D.      MDM  Pt did not have any fall.   No sign of pathologic fracture.  Will treat with pain  meds.  Follow up with sports medicine if pain persists.       Kathalene Frames, MD 04/14/11 [phone]

## 2011-04-21 ENCOUNTER — Encounter (HOSPITAL_COMMUNITY)
Admission: RE | Admit: 2011-04-21 | Discharge: 2011-04-21 | Disposition: A | Discharge status: Home / Self Care | Payer: Medicare Other | Source: Ambulatory Visit | Attending: Nephrology | Admitting: Nephrology

## 2011-04-21 DIAGNOSIS — D638 Anemia in other chronic diseases classified elsewhere: Secondary | ICD-10-CM | POA: Insufficient documentation

## 2011-04-21 DIAGNOSIS — N184 Chronic kidney disease, stage 4 (severe): Secondary | ICD-10-CM | POA: Insufficient documentation

## 2011-04-21 DIAGNOSIS — Z940 Kidney transplant status: Secondary | ICD-10-CM | POA: Insufficient documentation

## 2011-04-21 LAB — POCT HEMOGLOBIN-HEMACUE: Hemoglobin: 11.7 g/dL — ABNORMAL LOW (ref 12.0–15.0)

## 2011-04-21 LAB — IRON AND TIBC
Iron: 109 ug/dL (ref 42–135)
TIBC: 211 ug/dL — ABNORMAL LOW (ref 250–470)
UIBC: 102 ug/dL — ABNORMAL LOW (ref 125–400)

## 2011-04-21 LAB — RENAL FUNCTION PANEL
Albumin: 3.7 g/dL (ref 3.5–5.2)
BUN: 33 mg/dL — ABNORMAL HIGH (ref 6–23)
Creatinine, Ser: 1.78 mg/dL — ABNORMAL HIGH (ref 0.50–1.10)
Glucose, Bld: 105 mg/dL — ABNORMAL HIGH (ref 70–99)
Phosphorus: 3.4 mg/dL (ref 2.3–4.6)
Potassium: 4 mEq/L (ref 3.5–5.1)

## 2011-04-21 LAB — FERRITIN: Ferritin: 985 ng/mL — ABNORMAL HIGH (ref 10–291)

## 2011-04-21 MED ORDER — EPOETIN ALFA 40000 UNIT/ML IJ SOLN
INTRAMUSCULAR | Status: AC
  Filled 2011-04-21: qty 1

## 2011-04-21 MED ORDER — EPOETIN ALFA 10000 UNIT/ML IJ SOLN: 40000.0000 [IU] | INTRAMUSCULAR | Status: DC

## 2011-04-22 MED FILL — Epoetin Alfa Inj 40000 Unit/ML: INTRAMUSCULAR | Qty: 1 | Status: AC

## 2011-05-05 ENCOUNTER — Encounter (HOSPITAL_COMMUNITY): Payer: Medicare Other

## 2011-05-19 ENCOUNTER — Encounter (HOSPITAL_COMMUNITY)
Admission: RE | Admit: 2011-05-19 | Discharge: 2011-05-19 | Disposition: A | Discharge status: Home / Self Care | Payer: Medicare Other | Source: Ambulatory Visit | Attending: Nephrology | Admitting: Nephrology

## 2011-05-19 DIAGNOSIS — Z940 Kidney transplant status: Secondary | ICD-10-CM | POA: Diagnosis not present

## 2011-05-19 DIAGNOSIS — N184 Chronic kidney disease, stage 4 (severe): Secondary | ICD-10-CM | POA: Diagnosis not present

## 2011-05-19 DIAGNOSIS — D638 Anemia in other chronic diseases classified elsewhere: Secondary | ICD-10-CM | POA: Insufficient documentation

## 2011-05-19 LAB — IRON AND TIBC
Saturation Ratios: 43 % (ref 20–55)
UIBC: 123 ug/dL — ABNORMAL LOW (ref 125–400)

## 2011-05-19 LAB — RENAL FUNCTION PANEL
CO2: 24 mEq/L (ref 19–32)
Calcium: 9.5 mg/dL (ref 8.4–10.5)
Creatinine, Ser: 1.74 mg/dL — ABNORMAL HIGH (ref 0.50–1.10)
Glucose, Bld: 135 mg/dL — ABNORMAL HIGH (ref 70–99)
Phosphorus: 3.1 mg/dL (ref 2.3–4.6)
Sodium: 138 mEq/L (ref 135–145)

## 2011-05-19 LAB — POCT HEMOGLOBIN-HEMACUE: Hemoglobin: 11 g/dL — ABNORMAL LOW (ref 12.0–15.0)

## 2011-05-19 LAB — FERRITIN: Ferritin: 897 ng/mL — ABNORMAL HIGH (ref 10–291)

## 2011-05-19 MED ORDER — EPOETIN ALFA 40000 UNIT/ML IJ SOLN
INTRAMUSCULAR | Status: AC
  Administered 2011-05-19: 40000 [IU] via SUBCUTANEOUS
  Filled 2011-05-19: qty 1

## 2011-05-19 MED ORDER — EPOETIN ALFA 10000 UNIT/ML IJ SOLN: 40000.0000 [IU] | INTRAMUSCULAR | Status: DC

## 2011-05-20 LAB — PTH, INTACT AND CALCIUM
Calcium, Total (PTH): 9.4 mg/dL (ref 8.4–10.5)
PTH: 30.1 pg/mL (ref 14.0–72.0)

## 2011-06-07 ENCOUNTER — Other Ambulatory Visit (HOSPITAL_COMMUNITY): Payer: Self-pay | Admitting: *Deleted

## 2011-06-09 ENCOUNTER — Encounter (HOSPITAL_COMMUNITY)
Admission: RE | Admit: 2011-06-09 | Discharge: 2011-06-09 | Disposition: A | Discharge status: Home / Self Care | Payer: Medicare Other | Source: Ambulatory Visit | Attending: Nephrology | Admitting: Nephrology

## 2011-06-09 LAB — POCT HEMOGLOBIN-HEMACUE: Hemoglobin: 11.2 g/dL — ABNORMAL LOW (ref 12.0–15.0)

## 2011-06-09 MED ORDER — EPOETIN ALFA 10000 UNIT/ML IJ SOLN: 40000.0000 [IU] | INTRAMUSCULAR | Status: DC

## 2011-06-09 MED ORDER — EPOETIN ALFA 40000 UNIT/ML IJ SOLN
INTRAMUSCULAR | Status: AC
  Administered 2011-06-09: 40000 [IU] via SUBCUTANEOUS
  Filled 2011-06-09: qty 1

## 2011-06-30 ENCOUNTER — Encounter (HOSPITAL_COMMUNITY)
Admission: RE | Admit: 2011-06-30 | Discharge: 2011-06-30 | Disposition: A | Discharge status: Home / Self Care | Payer: Medicare Other | Source: Ambulatory Visit | Attending: Nephrology | Admitting: Nephrology

## 2011-06-30 DIAGNOSIS — Z940 Kidney transplant status: Secondary | ICD-10-CM | POA: Insufficient documentation

## 2011-06-30 DIAGNOSIS — N184 Chronic kidney disease, stage 4 (severe): Secondary | ICD-10-CM | POA: Diagnosis not present

## 2011-06-30 DIAGNOSIS — D638 Anemia in other chronic diseases classified elsewhere: Secondary | ICD-10-CM | POA: Insufficient documentation

## 2011-06-30 LAB — RENAL FUNCTION PANEL
Albumin: 3.8 g/dL (ref 3.5–5.2)
Calcium: 10 mg/dL (ref 8.4–10.5)
Chloride: 104 mEq/L (ref 96–112)
Creatinine, Ser: 1.87 mg/dL — ABNORMAL HIGH (ref 0.50–1.10)
GFR calc non Af Amer: 27 mL/min — ABNORMAL LOW (ref >90)

## 2011-06-30 LAB — POCT HEMOGLOBIN-HEMACUE: Hemoglobin: 11.7 g/dL — ABNORMAL LOW (ref 12.0–15.0)

## 2011-06-30 MED ORDER — EPOETIN ALFA 40000 UNIT/ML IJ SOLN
INTRAMUSCULAR | Status: AC
  Administered 2011-06-30: 40000 [IU] via SUBCUTANEOUS
  Filled 2011-06-30: qty 1

## 2011-06-30 MED ORDER — EPOETIN ALFA 10000 UNIT/ML IJ SOLN: 40000.0000 [IU] | INTRAMUSCULAR | Status: DC

## 2011-07-01 LAB — PTH, INTACT AND CALCIUM: PTH: 35.6 pg/mL (ref 14.0–72.0)

## 2011-07-04 DIAGNOSIS — N2581 Secondary hyperparathyroidism of renal origin: Secondary | ICD-10-CM | POA: Diagnosis not present

## 2011-07-04 DIAGNOSIS — Z940 Kidney transplant status: Secondary | ICD-10-CM | POA: Diagnosis not present

## 2011-07-04 DIAGNOSIS — E039 Hypothyroidism, unspecified: Secondary | ICD-10-CM | POA: Diagnosis not present

## 2011-07-04 DIAGNOSIS — E785 Hyperlipidemia, unspecified: Secondary | ICD-10-CM | POA: Diagnosis not present

## 2011-07-04 DIAGNOSIS — I10 Essential (primary) hypertension: Secondary | ICD-10-CM | POA: Diagnosis not present

## 2011-07-04 DIAGNOSIS — E213 Hyperparathyroidism, unspecified: Secondary | ICD-10-CM | POA: Diagnosis not present

## 2011-07-04 DIAGNOSIS — D649 Anemia, unspecified: Secondary | ICD-10-CM | POA: Diagnosis not present

## 2011-07-21 ENCOUNTER — Encounter (HOSPITAL_COMMUNITY): Payer: Medicare Other

## 2011-07-28 ENCOUNTER — Encounter (HOSPITAL_COMMUNITY)
Admission: RE | Admit: 2011-07-28 | Discharge: 2011-07-28 | Disposition: A | Discharge status: Home / Self Care | Payer: Medicare Other | Source: Ambulatory Visit | Attending: Nephrology | Admitting: Nephrology

## 2011-07-28 DIAGNOSIS — Z940 Kidney transplant status: Secondary | ICD-10-CM | POA: Diagnosis not present

## 2011-07-28 DIAGNOSIS — N184 Chronic kidney disease, stage 4 (severe): Secondary | ICD-10-CM | POA: Insufficient documentation

## 2011-07-28 DIAGNOSIS — D638 Anemia in other chronic diseases classified elsewhere: Secondary | ICD-10-CM | POA: Diagnosis not present

## 2011-07-28 LAB — RENAL FUNCTION PANEL
Albumin: 3.6 g/dL (ref 3.5–5.2)
GFR calc Af Amer: 38 mL/min — ABNORMAL LOW (ref >90)
Phosphorus: 3.4 mg/dL (ref 2.3–4.6)
Potassium: 3.5 mEq/L (ref 3.5–5.1)
Sodium: 139 mEq/L (ref 135–145)

## 2011-07-28 LAB — IRON AND TIBC: Iron: 131 ug/dL (ref 42–135)

## 2011-07-28 LAB — FERRITIN: Ferritin: 1430 ng/mL — ABNORMAL HIGH (ref 10–291)

## 2011-07-28 MED ORDER — EPOETIN ALFA 10000 UNIT/ML IJ SOLN: 40000.0000 [IU] | INTRAMUSCULAR | Status: DC

## 2011-07-28 MED ORDER — EPOETIN ALFA 40000 UNIT/ML IJ SOLN
INTRAMUSCULAR | Status: AC
  Administered 2011-07-28: 40000 [IU] via SUBCUTANEOUS
  Filled 2011-07-28: qty 1

## 2011-08-18 ENCOUNTER — Encounter (HOSPITAL_COMMUNITY)
Admission: RE | Admit: 2011-08-18 | Discharge: 2011-08-18 | Disposition: A | Discharge status: Home / Self Care | Payer: Medicare Other | Source: Ambulatory Visit | Attending: Nephrology | Admitting: Nephrology

## 2011-08-18 DIAGNOSIS — Z940 Kidney transplant status: Secondary | ICD-10-CM | POA: Diagnosis not present

## 2011-08-18 DIAGNOSIS — N184 Chronic kidney disease, stage 4 (severe): Secondary | ICD-10-CM | POA: Diagnosis not present

## 2011-08-18 DIAGNOSIS — D638 Anemia in other chronic diseases classified elsewhere: Secondary | ICD-10-CM | POA: Diagnosis not present

## 2011-08-18 MED ORDER — EPOETIN ALFA 40000 UNIT/ML IJ SOLN
INTRAMUSCULAR | Status: AC
  Administered 2011-08-18: 40000 [IU] via SUBCUTANEOUS
  Filled 2011-08-18: qty 1

## 2011-08-18 MED ORDER — EPOETIN ALFA 2000 UNIT/ML IJ SOLN
INTRAMUSCULAR | Status: AC
  Filled 2011-08-18: qty 1

## 2011-08-18 MED ORDER — EPOETIN ALFA 10000 UNIT/ML IJ SOLN: 40000.0000 [IU] | Freq: Once | INTRAMUSCULAR | Status: DC

## 2011-08-25 DIAGNOSIS — D649 Anemia, unspecified: Secondary | ICD-10-CM | POA: Diagnosis not present

## 2011-08-25 DIAGNOSIS — I10 Essential (primary) hypertension: Secondary | ICD-10-CM | POA: Diagnosis not present

## 2011-08-25 DIAGNOSIS — E079 Disorder of thyroid, unspecified: Secondary | ICD-10-CM | POA: Diagnosis not present

## 2011-08-25 DIAGNOSIS — I4891 Unspecified atrial fibrillation: Secondary | ICD-10-CM | POA: Diagnosis not present

## 2011-09-07 ENCOUNTER — Other Ambulatory Visit (HOSPITAL_COMMUNITY): Payer: Self-pay | Admitting: *Deleted

## 2011-09-08 ENCOUNTER — Inpatient Hospital Stay (HOSPITAL_COMMUNITY): Admission: RE | Admit: 2011-09-08 | Payer: Medicare Other | Source: Ambulatory Visit

## 2011-09-15 ENCOUNTER — Encounter (HOSPITAL_COMMUNITY)
Admission: RE | Admit: 2011-09-15 | Discharge: 2011-09-15 | Disposition: A | Discharge status: Home / Self Care | Payer: Medicare Other | Source: Ambulatory Visit | Attending: Nephrology | Admitting: Nephrology

## 2011-09-15 DIAGNOSIS — Z01419 Encounter for gynecological examination (general) (routine) without abnormal findings: Secondary | ICD-10-CM | POA: Diagnosis not present

## 2011-09-15 DIAGNOSIS — N184 Chronic kidney disease, stage 4 (severe): Secondary | ICD-10-CM | POA: Diagnosis not present

## 2011-09-15 DIAGNOSIS — Z940 Kidney transplant status: Secondary | ICD-10-CM | POA: Diagnosis not present

## 2011-09-15 DIAGNOSIS — Z124 Encounter for screening for malignant neoplasm of cervix: Secondary | ICD-10-CM | POA: Diagnosis not present

## 2011-09-15 DIAGNOSIS — D638 Anemia in other chronic diseases classified elsewhere: Secondary | ICD-10-CM | POA: Insufficient documentation

## 2011-09-15 DIAGNOSIS — Z1231 Encounter for screening mammogram for malignant neoplasm of breast: Secondary | ICD-10-CM | POA: Diagnosis not present

## 2011-09-15 LAB — IRON AND TIBC
Iron: 80 ug/dL (ref 42–135)
TIBC: 192 ug/dL — ABNORMAL LOW (ref 250–470)

## 2011-09-15 LAB — RENAL FUNCTION PANEL
Albumin: 4 g/dL (ref 3.5–5.2)
BUN: 32 mg/dL — ABNORMAL HIGH (ref 6–23)
Chloride: 102 mEq/L (ref 96–112)
GFR calc Af Amer: 36 mL/min — ABNORMAL LOW (ref >90)
Glucose, Bld: 118 mg/dL — ABNORMAL HIGH (ref 70–99)
Potassium: 4 mEq/L (ref 3.5–5.1)
Sodium: 138 mEq/L (ref 135–145)

## 2011-09-15 LAB — POCT HEMOGLOBIN-HEMACUE: Hemoglobin: 12 g/dL (ref 12.0–15.0)

## 2011-09-15 MED ORDER — EPOETIN ALFA 10000 UNIT/ML IJ SOLN: 40000.0000 [IU] | INTRAMUSCULAR | Status: DC

## 2011-09-16 LAB — FERRITIN: Ferritin: 909 ng/mL — ABNORMAL HIGH (ref 10–291)

## 2011-09-27 DIAGNOSIS — I10 Essential (primary) hypertension: Secondary | ICD-10-CM | POA: Diagnosis not present

## 2011-09-27 DIAGNOSIS — D649 Anemia, unspecified: Secondary | ICD-10-CM | POA: Diagnosis not present

## 2011-09-27 DIAGNOSIS — E213 Hyperparathyroidism, unspecified: Secondary | ICD-10-CM | POA: Diagnosis not present

## 2011-09-27 DIAGNOSIS — Z940 Kidney transplant status: Secondary | ICD-10-CM | POA: Diagnosis not present

## 2011-09-27 DIAGNOSIS — E039 Hypothyroidism, unspecified: Secondary | ICD-10-CM | POA: Diagnosis not present

## 2011-09-27 DIAGNOSIS — M329 Systemic lupus erythematosus, unspecified: Secondary | ICD-10-CM | POA: Diagnosis not present

## 2011-09-27 DIAGNOSIS — N2581 Secondary hyperparathyroidism of renal origin: Secondary | ICD-10-CM | POA: Diagnosis not present

## 2011-09-29 ENCOUNTER — Encounter (HOSPITAL_COMMUNITY): Payer: Medicare Other

## 2011-10-13 ENCOUNTER — Encounter (HOSPITAL_COMMUNITY): Payer: Medicare Other

## 2011-10-20 ENCOUNTER — Encounter (HOSPITAL_COMMUNITY)
Admission: RE | Admit: 2011-10-20 | Discharge: 2011-10-20 | Disposition: A | Discharge status: Home / Self Care | Payer: Medicare Other | Source: Ambulatory Visit | Attending: Nephrology | Admitting: Nephrology

## 2011-10-20 DIAGNOSIS — Z940 Kidney transplant status: Secondary | ICD-10-CM | POA: Insufficient documentation

## 2011-10-20 DIAGNOSIS — N184 Chronic kidney disease, stage 4 (severe): Secondary | ICD-10-CM | POA: Diagnosis not present

## 2011-10-20 DIAGNOSIS — D638 Anemia in other chronic diseases classified elsewhere: Secondary | ICD-10-CM | POA: Insufficient documentation

## 2011-10-20 LAB — RENAL FUNCTION PANEL
CO2: 23 mEq/L (ref 19–32)
Calcium: 9.5 mg/dL (ref 8.4–10.5)
Chloride: 104 mEq/L (ref 96–112)
GFR calc Af Amer: 30 mL/min — ABNORMAL LOW (ref >90)
GFR calc non Af Amer: 26 mL/min — ABNORMAL LOW (ref >90)
Glucose, Bld: 99 mg/dL (ref 70–99)
Sodium: 139 mEq/L (ref 135–145)

## 2011-10-20 MED ORDER — EPOETIN ALFA 40000 UNIT/ML IJ SOLN
INTRAMUSCULAR | Status: AC
  Administered 2011-10-20: 40000 [IU] via SUBCUTANEOUS
  Filled 2011-10-20: qty 1

## 2011-10-20 MED ORDER — EPOETIN ALFA 10000 UNIT/ML IJ SOLN: 40000.0000 [IU] | INTRAMUSCULAR | Status: DC

## 2011-10-21 LAB — PTH, INTACT AND CALCIUM: PTH: 84.8 pg/mL — ABNORMAL HIGH (ref 14.0–72.0)

## 2011-10-21 LAB — IRON AND TIBC
Iron: 82 ug/dL (ref 42–135)
Saturation Ratios: 38 % (ref 20–55)
TIBC: 215 ug/dL — ABNORMAL LOW (ref 250–470)
UIBC: 133 ug/dL (ref 125–400)

## 2011-10-21 LAB — FERRITIN: Ferritin: 1026 ng/mL — ABNORMAL HIGH (ref 10–291)

## 2011-10-27 DIAGNOSIS — Z940 Kidney transplant status: Secondary | ICD-10-CM | POA: Diagnosis not present

## 2011-10-27 DIAGNOSIS — E213 Hyperparathyroidism, unspecified: Secondary | ICD-10-CM | POA: Diagnosis not present

## 2011-11-01 DIAGNOSIS — H26499 Other secondary cataract, unspecified eye: Secondary | ICD-10-CM | POA: Diagnosis not present

## 2011-11-04 DIAGNOSIS — K625 Hemorrhage of anus and rectum: Secondary | ICD-10-CM | POA: Diagnosis not present

## 2011-11-04 DIAGNOSIS — K624 Stenosis of anus and rectum: Secondary | ICD-10-CM | POA: Diagnosis not present

## 2011-11-04 DIAGNOSIS — K861 Other chronic pancreatitis: Secondary | ICD-10-CM | POA: Diagnosis not present

## 2011-11-04 DIAGNOSIS — K5010 Crohn's disease of large intestine without complications: Secondary | ICD-10-CM | POA: Diagnosis not present

## 2011-11-07 DIAGNOSIS — Z48298 Encounter for aftercare following other organ transplant: Secondary | ICD-10-CM | POA: Diagnosis not present

## 2011-11-07 DIAGNOSIS — D899 Disorder involving the immune mechanism, unspecified: Secondary | ICD-10-CM | POA: Diagnosis not present

## 2011-11-07 DIAGNOSIS — Z79899 Other long term (current) drug therapy: Secondary | ICD-10-CM | POA: Diagnosis not present

## 2011-11-07 DIAGNOSIS — K432 Incisional hernia without obstruction or gangrene: Secondary | ICD-10-CM | POA: Diagnosis not present

## 2011-11-07 DIAGNOSIS — Z8673 Personal history of transient ischemic attack (TIA), and cerebral infarction without residual deficits: Secondary | ICD-10-CM | POA: Diagnosis not present

## 2011-11-07 DIAGNOSIS — Z86718 Personal history of other venous thrombosis and embolism: Secondary | ICD-10-CM | POA: Diagnosis not present

## 2011-11-07 DIAGNOSIS — Z940 Kidney transplant status: Secondary | ICD-10-CM | POA: Diagnosis not present

## 2011-11-07 DIAGNOSIS — I10 Essential (primary) hypertension: Secondary | ICD-10-CM | POA: Diagnosis not present

## 2011-11-11 ENCOUNTER — Other Ambulatory Visit (HOSPITAL_COMMUNITY): Payer: Self-pay | Admitting: *Deleted

## 2011-11-17 ENCOUNTER — Encounter (HOSPITAL_COMMUNITY)
Admission: RE | Admit: 2011-11-17 | Discharge: 2011-11-17 | Disposition: A | Discharge status: Home / Self Care | Payer: Medicare Other | Source: Ambulatory Visit | Attending: Nephrology | Admitting: Nephrology

## 2011-11-17 DIAGNOSIS — N184 Chronic kidney disease, stage 4 (severe): Secondary | ICD-10-CM | POA: Diagnosis not present

## 2011-11-17 DIAGNOSIS — Z940 Kidney transplant status: Secondary | ICD-10-CM | POA: Diagnosis not present

## 2011-11-17 DIAGNOSIS — D638 Anemia in other chronic diseases classified elsewhere: Secondary | ICD-10-CM | POA: Insufficient documentation

## 2011-11-17 LAB — FERRITIN: Ferritin: 1154 ng/mL — ABNORMAL HIGH (ref 10–291)

## 2011-11-17 LAB — POCT HEMOGLOBIN-HEMACUE: Hemoglobin: 11.3 g/dL — ABNORMAL LOW (ref 12.0–15.0)

## 2011-11-17 LAB — IRON AND TIBC: UIBC: 123 ug/dL — ABNORMAL LOW (ref 125–400)

## 2011-11-17 MED ORDER — EPOETIN ALFA 40000 UNIT/ML IJ SOLN
INTRAMUSCULAR | Status: AC
  Administered 2011-11-17: 40000 [IU] via SUBCUTANEOUS
  Filled 2011-11-17: qty 1

## 2011-11-17 MED ORDER — EPOETIN ALFA 10000 UNIT/ML IJ SOLN: 40000.0000 [IU] | INTRAMUSCULAR | Status: DC

## 2011-11-23 DIAGNOSIS — Z940 Kidney transplant status: Secondary | ICD-10-CM | POA: Diagnosis not present

## 2011-12-07 ENCOUNTER — Other Ambulatory Visit (HOSPITAL_COMMUNITY): Payer: Self-pay | Admitting: *Deleted

## 2011-12-08 ENCOUNTER — Encounter (HOSPITAL_COMMUNITY)
Admission: RE | Admit: 2011-12-08 | Discharge: 2011-12-08 | Disposition: A | Discharge status: Home / Self Care | Payer: Medicare Other | Source: Ambulatory Visit | Attending: Nephrology | Admitting: Nephrology

## 2011-12-08 DIAGNOSIS — N184 Chronic kidney disease, stage 4 (severe): Secondary | ICD-10-CM | POA: Insufficient documentation

## 2011-12-08 DIAGNOSIS — Z940 Kidney transplant status: Secondary | ICD-10-CM | POA: Diagnosis not present

## 2011-12-08 DIAGNOSIS — D638 Anemia in other chronic diseases classified elsewhere: Secondary | ICD-10-CM | POA: Insufficient documentation

## 2011-12-08 LAB — RENAL FUNCTION PANEL
Albumin: 3.8 g/dL (ref 3.5–5.2)
Chloride: 99 mEq/L (ref 96–112)
GFR calc non Af Amer: 32 mL/min — ABNORMAL LOW (ref >90)
Phosphorus: 3.6 mg/dL (ref 2.3–4.6)
Potassium: 3.6 mEq/L (ref 3.5–5.1)
Sodium: 137 mEq/L (ref 135–145)

## 2011-12-08 LAB — POCT HEMOGLOBIN-HEMACUE: Hemoglobin: 11.8 g/dL — ABNORMAL LOW (ref 12.0–15.0)

## 2011-12-08 MED ORDER — EPOETIN ALFA 10000 UNIT/ML IJ SOLN: 40000.0000 [IU] | INTRAMUSCULAR | Status: DC

## 2011-12-08 MED ORDER — EPOETIN ALFA 40000 UNIT/ML IJ SOLN
INTRAMUSCULAR | Status: AC
  Administered 2011-12-08: 40000 [IU] via SUBCUTANEOUS
  Filled 2011-12-08: qty 1

## 2011-12-09 LAB — TACROLIMUS LEVEL: Tacrolimus (FK506) - LabCorp: 3.9 ng/mL

## 2011-12-20 ENCOUNTER — Inpatient Hospital Stay (HOSPITAL_BASED_OUTPATIENT_CLINIC_OR_DEPARTMENT_OTHER)
Admission: EM | Admit: 2011-12-20 | Discharge: 2011-12-21 | DRG: 300 | Disposition: A | Discharge status: Home / Self Care | Payer: Medicare Other | Attending: Family Medicine | Admitting: Family Medicine

## 2011-12-20 ENCOUNTER — Encounter (HOSPITAL_BASED_OUTPATIENT_CLINIC_OR_DEPARTMENT_OTHER): Payer: Self-pay | Admitting: Emergency Medicine

## 2011-12-20 ENCOUNTER — Emergency Department (HOSPITAL_BASED_OUTPATIENT_CLINIC_OR_DEPARTMENT_OTHER): Payer: Medicare Other

## 2011-12-20 DIAGNOSIS — I82409 Acute embolism and thrombosis of unspecified deep veins of unspecified lower extremity: Secondary | ICD-10-CM | POA: Diagnosis not present

## 2011-12-20 DIAGNOSIS — Z8553 Personal history of malignant neoplasm of renal pelvis: Secondary | ICD-10-CM

## 2011-12-20 DIAGNOSIS — I4891 Unspecified atrial fibrillation: Secondary | ICD-10-CM | POA: Diagnosis not present

## 2011-12-20 DIAGNOSIS — Z79899 Other long term (current) drug therapy: Secondary | ICD-10-CM | POA: Diagnosis not present

## 2011-12-20 DIAGNOSIS — M329 Systemic lupus erythematosus, unspecified: Secondary | ICD-10-CM | POA: Diagnosis present

## 2011-12-20 DIAGNOSIS — E039 Hypothyroidism, unspecified: Secondary | ICD-10-CM | POA: Diagnosis present

## 2011-12-20 DIAGNOSIS — I824Y9 Acute embolism and thrombosis of unspecified deep veins of unspecified proximal lower extremity: Principal | ICD-10-CM | POA: Diagnosis present

## 2011-12-20 DIAGNOSIS — K5090 Crohn's disease, unspecified, without complications: Secondary | ICD-10-CM | POA: Diagnosis present

## 2011-12-20 DIAGNOSIS — Z940 Kidney transplant status: Secondary | ICD-10-CM | POA: Diagnosis not present

## 2011-12-20 DIAGNOSIS — I129 Hypertensive chronic kidney disease with stage 1 through stage 4 chronic kidney disease, or unspecified chronic kidney disease: Secondary | ICD-10-CM | POA: Diagnosis present

## 2011-12-20 DIAGNOSIS — K219 Gastro-esophageal reflux disease without esophagitis: Secondary | ICD-10-CM | POA: Diagnosis present

## 2011-12-20 DIAGNOSIS — I10 Essential (primary) hypertension: Secondary | ICD-10-CM | POA: Diagnosis not present

## 2011-12-20 DIAGNOSIS — N183 Chronic kidney disease, stage 3 unspecified: Secondary | ICD-10-CM | POA: Diagnosis present

## 2011-12-20 DIAGNOSIS — M129 Arthropathy, unspecified: Secondary | ICD-10-CM | POA: Diagnosis present

## 2011-12-20 DIAGNOSIS — Z531 Procedure and treatment not carried out because of patient's decision for reasons of belief and group pressure: Secondary | ICD-10-CM

## 2011-12-20 DIAGNOSIS — I82411 Acute embolism and thrombosis of right femoral vein: Secondary | ICD-10-CM

## 2011-12-20 HISTORY — DX: Acute embolism and thrombosis of unspecified deep veins of unspecified lower extremity: I82.409

## 2011-12-20 HISTORY — DX: Unspecified atrial fibrillation: I48.91

## 2011-12-20 HISTORY — DX: Hypothyroidism, unspecified: E03.9

## 2011-12-20 HISTORY — DX: Procedure and treatment not carried out because of patient's decision for reasons of belief and group pressure: Z53.1

## 2011-12-20 HISTORY — DX: Paresthesia of skin: R20.0

## 2011-12-20 HISTORY — DX: Gastro-esophageal reflux disease without esophagitis: K21.9

## 2011-12-20 HISTORY — DX: Dependence on other enabling machines and devices: Z99.89

## 2011-12-20 HISTORY — DX: Anemia, unspecified: D64.9

## 2011-12-20 HISTORY — DX: Gastrointestinal hemorrhage, unspecified: K92.2

## 2011-12-20 HISTORY — DX: Paresthesia of skin: R20.2

## 2011-12-20 HISTORY — DX: Obstructive sleep apnea (adult) (pediatric): G47.33

## 2011-12-20 LAB — URINALYSIS, ROUTINE W REFLEX MICROSCOPIC
Bilirubin Urine: NEGATIVE
Nitrite: NEGATIVE
Specific Gravity, Urine: 1.008 (ref 1.005–1.030)
Urobilinogen, UA: 0.2 mg/dL (ref 0.0–1.0)
pH: 5.5 (ref 5.0–8.0)

## 2011-12-20 LAB — CBC WITH DIFFERENTIAL/PLATELET
Basophils Absolute: 0 10*3/uL (ref 0.0–0.1)
Eosinophils Relative: 2 % (ref 0–5)
Lymphocytes Relative: 10 % — ABNORMAL LOW (ref 12–46)
Lymphs Abs: 1.2 10*3/uL (ref 0.7–4.0)
Monocytes Relative: 10 % (ref 3–12)
Neutrophils Relative %: 78 % — ABNORMAL HIGH (ref 43–77)
Platelets: 117 10*3/uL — ABNORMAL LOW (ref 150–400)
RBC: 3.73 MIL/uL — ABNORMAL LOW (ref 3.87–5.11)
RDW: 15 % (ref 11.5–15.5)
WBC: 12 10*3/uL — ABNORMAL HIGH (ref 4.0–10.5)

## 2011-12-20 LAB — BASIC METABOLIC PANEL
BUN: 30 mg/dL — ABNORMAL HIGH (ref 6–23)
Chloride: 101 mEq/L (ref 96–112)
GFR calc Af Amer: 40 mL/min — ABNORMAL LOW (ref >90)
GFR calc non Af Amer: 35 mL/min — ABNORMAL LOW (ref >90)
Potassium: 3.7 mEq/L (ref 3.5–5.1)
Sodium: 138 mEq/L (ref 135–145)

## 2011-12-20 LAB — TSH: TSH: 1.228 u[IU]/mL (ref 0.350–4.500)

## 2011-12-20 LAB — URINE MICROSCOPIC-ADD ON: RBC / HPF: NONE SEEN RBC/hpf (ref <3)

## 2011-12-20 LAB — PROTIME-INR: INR: 1.09 (ref 0.00–1.49)

## 2011-12-20 MED ORDER — ENOXAPARIN SODIUM 80 MG/0.8ML ~~LOC~~ SOLN
1.0000 mg/kg | Freq: Two times a day (BID) | SUBCUTANEOUS | Status: DC
  Administered 2011-12-20 – 2011-12-21 (×2): 65 mg via SUBCUTANEOUS
  Filled 2011-12-20 (×3): qty 0.8

## 2011-12-20 MED ORDER — OXYCODONE-ACETAMINOPHEN 5-325 MG PO TABS
1.0000 | ORAL_TABLET | ORAL | Status: DC | PRN
  Administered 2011-12-20 – 2011-12-21 (×3): 1 via ORAL
  Filled 2011-12-20 (×3): qty 1

## 2011-12-20 MED ORDER — MYCOPHENOLATE SODIUM 180 MG PO TBEC
360.0000 mg | DELAYED_RELEASE_TABLET | Freq: Two times a day (BID) | ORAL | Status: DC
  Administered 2011-12-20 – 2011-12-21 (×2): 360 mg via ORAL
  Filled 2011-12-20 (×3): qty 2

## 2011-12-20 MED ORDER — POLYETHYLENE GLYCOL 3350 17 G PO PACK
17.0000 g | PACK | Freq: Two times a day (BID) | ORAL | Status: DC | PRN
  Administered 2011-12-20 – 2011-12-21 (×2): 17 g via ORAL
  Filled 2011-12-20 (×2): qty 1

## 2011-12-20 MED ORDER — TACROLIMUS 1 MG PO CAPS
5.0000 mg | ORAL_CAPSULE | Freq: Two times a day (BID) | ORAL | Status: DC
  Administered 2011-12-20 – 2011-12-21 (×2): 5 mg via ORAL
  Filled 2011-12-20 (×4): qty 5

## 2011-12-20 MED ORDER — NIFEDIPINE ER 60 MG PO TB24
60.0000 mg | ORAL_TABLET | Freq: Every day | ORAL | Status: DC
  Administered 2011-12-20 – 2011-12-21 (×2): 60 mg via ORAL
  Filled 2011-12-20 (×2): qty 1

## 2011-12-20 MED ORDER — OXYCODONE-ACETAMINOPHEN 5-325 MG PO TABS
1.0000 | ORAL_TABLET | Freq: Once | ORAL | Status: AC
  Administered 2011-12-20: 1 via ORAL
  Filled 2011-12-20 (×2): qty 1

## 2011-12-20 MED ORDER — TACROLIMUS 1 MG PO CAPS
1.0000 mg | ORAL_CAPSULE | Freq: Two times a day (BID) | ORAL | Status: DC
  Administered 2011-12-20 – 2011-12-21 (×2): 1 mg via ORAL
  Filled 2011-12-20 (×3): qty 1

## 2011-12-20 MED ORDER — LEVOTHYROXINE SODIUM 100 MCG PO TABS
100.0000 ug | ORAL_TABLET | Freq: Every day | ORAL | Status: DC
  Administered 2011-12-20 – 2011-12-21 (×2): 100 ug via ORAL
  Filled 2011-12-20 (×4): qty 1

## 2011-12-20 MED ORDER — SODIUM CHLORIDE 0.9 % IV SOLN: 250.0000 mL | INTRAVENOUS | Status: DC | PRN

## 2011-12-20 MED ORDER — PANTOPRAZOLE SODIUM 40 MG PO TBEC
40.0000 mg | DELAYED_RELEASE_TABLET | Freq: Every day | ORAL | Status: DC
  Administered 2011-12-21: 40 mg via ORAL
  Filled 2011-12-20: qty 1

## 2011-12-20 MED ORDER — ENOXAPARIN SODIUM 80 MG/0.8ML ~~LOC~~ SOLN
1.0000 mg/kg | Freq: Once | SUBCUTANEOUS | Status: AC
  Administered 2011-12-20: 65 mg via SUBCUTANEOUS
  Filled 2011-12-20: qty 0.8

## 2011-12-20 MED ORDER — METOPROLOL TARTRATE 100 MG PO TABS
100.0000 mg | ORAL_TABLET | Freq: Two times a day (BID) | ORAL | Status: DC
  Administered 2011-12-20 – 2011-12-21 (×2): 100 mg via ORAL
  Filled 2011-12-20 (×3): qty 1

## 2011-12-20 MED ORDER — ACETAMINOPHEN 325 MG PO TABS: 650.0000 mg | ORAL_TABLET | Freq: Four times a day (QID) | ORAL | Status: DC | PRN

## 2011-12-20 MED ORDER — SODIUM CHLORIDE 0.9 % IJ SOLN
3.0000 mL | Freq: Two times a day (BID) | INTRAMUSCULAR | Status: DC
  Administered 2011-12-20 – 2011-12-21 (×2): 3 mL via INTRAVENOUS

## 2011-12-20 MED ORDER — CALCITRIOL 0.25 MCG PO CAPS
0.2500 ug | ORAL_CAPSULE | ORAL | Status: DC
  Administered 2011-12-21: 0.25 ug via ORAL
  Filled 2011-12-20: qty 1

## 2011-12-20 MED ORDER — GABAPENTIN 100 MG PO CAPS
200.0000 mg | ORAL_CAPSULE | Freq: Every day | ORAL | Status: DC
  Administered 2011-12-20: 200 mg via ORAL
  Filled 2011-12-20 (×2): qty 2

## 2011-12-20 MED ORDER — SODIUM CHLORIDE 0.9 % IJ SOLN: 3.0000 mL | INTRAMUSCULAR | Status: DC | PRN

## 2011-12-20 MED ORDER — PREDNISONE 5 MG PO TABS
5.0000 mg | ORAL_TABLET | Freq: Every day | ORAL | Status: DC
  Administered 2011-12-20 – 2011-12-21 (×2): 5 mg via ORAL
  Filled 2011-12-20 (×2): qty 1

## 2011-12-20 MED ORDER — AMIODARONE HCL 100 MG PO TABS
100.0000 mg | ORAL_TABLET | Freq: Two times a day (BID) | ORAL | Status: DC
  Administered 2011-12-20 – 2011-12-21 (×2): 100 mg via ORAL
  Filled 2011-12-20 (×3): qty 1

## 2011-12-20 MED ORDER — CALCITRIOL 0.5 MCG PO CAPS
0.5000 ug | ORAL_CAPSULE | ORAL | Status: DC
  Administered 2011-12-20: 0.5 ug via ORAL
  Filled 2011-12-20: qty 1

## 2011-12-20 NOTE — ED Notes (Signed)
Pt states pain started Sunday morning and has gradually gotten worse. Pt states thinking it could be "vascular" or in "the deep part of the body." Pt states she went to [facility] this morning to get blood drawn and technician told pt it could be a blood clot and sent her here. Pt has a history of blood clots. Pt c/o pain in thigh and groin area.

## 2011-12-20 NOTE — ED Provider Notes (Signed)
History     CSN: [id number]  Arrival date & time 12/20/11  1003   First MD Initiated Contact with Patient 12/20/11 1030      Chief Complaint  Patient presents with  . Leg Pain    Right leg     (Consider location/radiation/quality/duration/timing/severity/associated sxs/prior treatment) Patient is a 71 y.o. female presenting with leg pain. The history is provided by the patient.  Leg Pain   She noticed onset 2 days ago of pain in the medial aspect of her right thigh. Pain is dull and severe and she rates it at 9/10. It is worse with standing and ambulation and better with rest. She's also noted some numbness in her right foot. She denies any trauma. She's concerned about possible blood clot in her leg and she does have a history of blood clots. She denies fever, chills, sweats. She denies bowel or bladder dysfunction. 2 status post kidney transplant. She does have history of what sounds like claudication in that if she walks about 100 feet, she gets aching in both of her legs which gets better with rest and then she can walk a similar distance again. This leg pain with walking has been stable over time.  Past Medical History  Diagnosis Date  . Crohn's disease   . Hypertension   . Arthritis   . Renal disorder   . History of kidney transplant   . Thyroid disease   . Reflux   . History of blood clots   . Lupus     Past Surgical History  Procedure Date  . Kidney transplant   . Cholecystectomy   . Cesarean section   . Nephrectomy transplanted organ   . Eye surgery   . Hemorrhoidectomy     History reviewed. No pertinent family history.  History  Substance Use Topics  . Smoking status: Never Smoker   . Smokeless tobacco: Not on file  . Alcohol Use: No    OB History    Grav Para Term Preterm Abortions TAB SAB Ect Mult Living                  Review of Systems  All other systems reviewed and are negative.    Allergies  Amoxicillin; Ampicillin; Ciprofloxacin;  Erythromycin; Labetalol; Penicillins; Sulfamethoxazole w-trimethoprim; and Tetracycline  Home Medications   Current Outpatient Rx  Name Route Sig Dispense Refill  . ACETAMINOPHEN 325 MG PO TABS Oral Take 650 mg by mouth every 6 (six) hours as needed.      . AMIODARONE HCL 200 MG PO TABS Oral Take 100 mg by mouth 2 (two) times daily.      Marland Kitchen CALCITRIOL 0.25 MCG PO CAPS Oral Take 0.25 mcg by mouth daily.      . CYANOCOBALAMIN 100 MCG PO TABS Oral Take 100 mcg by mouth daily.      Marland Kitchen FOLIC ACID 1 MG PO TABS Oral Take 2 mg by mouth daily.      Marland Kitchen GABAPENTIN 100 MG PO CAPS Oral Take 100 mg by mouth at bedtime.      Marland Kitchen LEVOTHYROXINE SODIUM 50 MCG PO TABS Oral Take 50 mcg by mouth daily.      Marland Kitchen METOPROLOL TARTRATE 25 MG PO TABS Oral Take 25 mg by mouth 2 (two) times daily.      Marland Kitchen MYCOPHENOLATE SODIUM 180 MG PO TBEC Oral Take 360 mg by mouth 2 (two) times daily.      Marland Kitchen NIFEDIPINE ER OSMOTIC 60 MG PO TB24  Oral Take 60 mg by mouth daily.      Marland Kitchen OMEPRAZOLE 20 MG PO CPDR Oral Take 20 mg by mouth daily.      Marland Kitchen POLYETHYLENE GLYCOL 3350 PO PACK Oral Take 17 g by mouth 3 (three) times daily as needed.      Marland Kitchen POLYSACCHARIDE IRON 150 MG PO CAPS Oral Take 150 mg by mouth daily.      Marland Kitchen PREDNISONE 5 MG PO TABS Oral Take 7.5 mg by mouth daily.      Marland Kitchen TACROLIMUS 5 MG PO CAPS Oral Take 6 mg by mouth 2 (two) times daily.        There were no vitals taken for this visit.  Physical Exam  Nursing note and vitals reviewed. 71year old female, resting comfortably and in no acute distress. Vital signs are significant for mild hypertension with blood pressure 145/68. Oxygen saturation is 100%, which is normal. Head is normocephalic and atraumatic. PERRLA, EOMI. Oropharynx is clear. Neck is nontender and supple without adenopathy or JVD. Back is nontender and there is no CVA tenderness. Straight leg raise is positive at 45 on the right, and 60 on the left. Lungs are clear without rales, wheezes, or rhonchi. Chest is  nontender. Heart has regular rate and rhythm without murmur. Abdomen is soft, flat, nontender without masses or hepatosplenomegaly and peristalsis is normoactive. Extremities have no cyanosis or edema, full range of motion is present. There is mild tenderness to palpation over the medial aspect of the left thigh. Skin is warm and dry without rash. Neurologic: Mental status is normal, cranial nerves are intact. There is slight decreased pin prick sensation in the right foot. She has weakness of extension of the right first toe.   ED Course  Procedures (including critical care time)  Results for orders placed during the hospital encounter of 12/20/11  CBC WITH DIFFERENTIAL      Component Value Range   WBC 12.0 (*) 4.0 - 10.5 K/uL   RBC 3.73 (*) 3.87 - 5.11 MIL/uL   Hemoglobin 11.1 (*) 12.0 - 15.0 g/dL   HCT 34.6 (*) 36.0 - 46.0 %   MCV 92.8  78.0 - 100.0 fL   MCH 29.8  26.0 - 34.0 pg   MCHC 32.1  30.0 - 36.0 g/dL   RDW 15.0  11.5 - 15.5 %   Platelets 117 (*) 150 - 400 K/uL   Neutrophils Relative 78 (*) 43 - 77 %   Lymphocytes Relative 10 (*) 12 - 46 %   Monocytes Relative 10  3 - 12 %   Eosinophils Relative 2  0 - 5 %   Basophils Relative 0  0 - 1 %   Neutro Abs 9.4 (*) 1.7 - 7.7 K/uL   Lymphs Abs 1.2  0.7 - 4.0 K/uL   Monocytes Absolute 1.2 (*) 0.1 - 1.0 K/uL   Eosinophils Absolute 0.2  0.0 - 0.7 K/uL   Basophils Absolute 0.0  0.0 - 0.1 K/uL   WBC Morphology TOXIC GRANULATION     Smear Review PLATELET COUNT CONFIRMED BY SMEAR    BASIC METABOLIC PANEL      Component Value Range   Sodium 138  135 - 145 mEq/L   Potassium 3.7  3.5 - 5.1 mEq/L   Chloride 101  96 - 112 mEq/L   CO2 26  19 - 32 mEq/L   Glucose, Bld 96  70 - 99 mg/dL   BUN 30 (*) 6 - 23 mg/dL   Creatinine,  Ser 1.50 (*) 0.50 - 1.10 mg/dL   Calcium 9.8  8.4 - 10.5 mg/dL   GFR calc non Af Amer 35 (*) >90 mL/min   GFR calc Af Amer 40 (*) >90 mL/min  PROTIME-INR      Component Value Range   Prothrombin Time 14.3   11.6 - 15.2 seconds   INR 1.09  0.00 - 1.49  APTT      Component Value Range   aPTT 42 (*) 24 - 37 seconds   US Venous Img Lower Unilateral Right  12/20/2011  **ADDENDUM** CREATED: 12/20/2011 11:57:15  These results were called by telephone on 12/20/2011 at 0354 hours to Dr Roxanne Mins, who verbally acknowledged these results.  **END ADDENDUM** SIGNED BY: Julian Hy, M.D.   12/20/2011  *RADIOLOGY REPORT*  Clinical Data: Leg pain  RIGHT LOWER EXTREMITY VENOUS DUPLEX ULTRASOUND  Technique:  Gray-scale sonography with graded compression, as well as color Doppler and duplex ultrasound, were performed to evaluate the deep venous system of the lower extremity from the level of the common femoral vein through the popliteal and proximal calf veins. Spectral Doppler was utilized to evaluate flow at rest and with distal augmentation maneuvers.  Comparison:  None.  Findings: Some flow is visible in the superficial femoral vein. The lower popliteal vein appears compressible.  Otherwise, there is thrombosis extending from the posterior tibial/peroneal veins in the calf centrally to the common femoral vein.  IMPRESSION: Deep venous thrombosis extending from the calf veins to the common femoral vein, as described above.  Original Report Authenticated By: Julian Hy, M.D.      1. Right femoral vein DVT       MDM  Thigh pain with numbness in the foot and toe weakness and a positive straight leg raise which is suspicious for lumbar radiculopathy. She has a history of what sounds like claudication, as the symptoms could also be due 2 spinal stenosis. I have offered her the option of MRI scan today, but she so she would rather get that as an outpatient if needed. Venous Doppler will be obtained to rule out DVT.  Venous Doppler shows DVT extending from the common femoral vein and involving all the distal veins. Because of extent of DVT, she will need to be treated as an inpatient initially. As of Lovenox was  given in the emergency department. Case is discussed with Dr. Aileen Fass who agrees to accept the patient in transfer. Of note, she had a prior episode of DVT one year ago. 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 Consideration should be given to evaluation for presence of antiphospholipid antibody, as well has evidence of membranous glomerulonephritis.  Delora Fuel, MD 65/68/12 [id number]

## 2011-12-20 NOTE — ED Notes (Signed)
Pt transported to [facility] and stable upon transfer.

## 2011-12-20 NOTE — ED Notes (Signed)
Report given to floor nurse, [address], [place], [place].

## 2011-12-20 NOTE — ED Notes (Signed)
MD at bedside. 

## 2011-12-20 NOTE — H&P (Signed)
[facility] History and Physical  [name] LPF:[id number] DOB: March 21, 1941 DOA: 12/20/2011   PCP: Placido Sou, MD   Chief Complaint: Right leg pain  HPI:  Pleasant 71 year old female with two-day history of right thigh pain that began groin. In the emergency department, ultrasound the right lower extremity revealed deep vein thrombosis from the calf veins up to the right common femoral vein. The patient was given Lovenox 5 mg IV. The patient was admitted to the hospital service for further evaluation and treatment.   Of note, the patient is a Restaurant manager, fast food. She states that she has had a remote "clot" in her left leg many years ago. In addition, the patient had a clot in her old AV fistula in her left thigh in August 2012. In addition, the patient has a history of a GI bleed in August 2012. Currently, the patient denies any chest pain, shortness breath, nausea, vomiting, diarrhea, abdominal pain, headaches, dizziness. The patient relates a history of Coumadin usage many years ago when she had a clot but has been weaned off. She does not know how long she took the coumadin.  In addition, the patient had an IVC filter placed on 12/17/2010 after her GI bleed episode. Since her GI bleed episode, the patient has not been on aspirin, Plavix, or any other anticoagulation for her atrial fibrillation.  The patient has a history of lupus. She states she does not formally followed by rheumatologist. The patient states that she previously took prednisone for lupus. The patient was previously on dialysis due to lupus. In addition she gives a history of renal cell carcinoma her bilateral kidneys. She has undergone bilateral nephrectomies. The patient underwent a renal transplant in 2006. She has not required dialysis since that period of time. Her baseline creatinine is 1.6-1.9. After her renal transplant, the patient was placed on tacrolimus, mycophenolate, and prednisone. She is very compliant  with his medications. Assessment/Plan:  DVT right leg -The patient has an IVC filter however she would benefit from warfarin therapy at this time given her history of lupus as well as her acute DVT -Risks, benefits, and alternatives of warfarin therapy and rivaroxaban were discussed including but not limited to life-threatening bleed. The patient's husband and son were at the bedside and all questions were answered to their satisfaction. -The patient's clinical scenario is complicated by her Jehovah's Witness faith -At this time, the patient is willing to take parenteral Lovenox -She will make a final decision on long-term warfarin in the next 24 hours -She does not want to take Xarelto at this time -Given the patient's history of lupus, she may benefit from indefinite anticoagulation -Check daily INRs Atrial fibrillation -Patient is currently in sinus rhythm -Check TSH Hypertension -Well controlled with metoprolol and nifedipine CKD stage III -She is status post renal transplant on immunosuppressive therapy -Renal function remains stable S/p Renal transplant Hx renal cell carcinoma s/p bilateral nephrectomy      Past Medical History  Diagnosis Date  . Crohn's disease   . Hypertension   . Arthritis   . Renal disorder   . History of kidney transplant   . Thyroid disease   . Reflux   . History of blood clots   . Lupus    Past Surgical History  Procedure Date  . Kidney transplant   . Cholecystectomy   . Cesarean section   . Nephrectomy transplanted organ   . Eye surgery   . Hemorrhoidectomy    Social History:  reports  that she has never smoked. She does not have any smokeless tobacco history on file. She reports that she does not drink alcohol or use illicit drugs.  Allergies  Allergen Reactions  . Amoxicillin Anaphylaxis  . Ampicillin Anaphylaxis  . Erythromycin Anaphylaxis  . Penicillins Anaphylaxis  . Tetracycline Anaphylaxis  . Ciprofloxacin     unknown  .  Labetalol Nausea And Vomiting  . Sulfa Antibiotics Itching  . Sulfamethoxazole W-Trimethoprim Itching    History reviewed. No pertinent family history.  FH-reviewed with patient.  No pertinent hx.  Prior to Admission medications   Medication Sig Start Date End Date Taking? Authorizing Provider  acetaminophen (TYLENOL) 325 MG tablet Take 650 mg by mouth every 6 (six) hours as needed. For pain   Yes Historical Provider, MD  amiodarone (PACERONE) 200 MG tablet Take 100 mg by mouth 2 (two) times daily.     Yes Historical Provider, MD  calcitRIOL (ROCALTROL) 0.25 MCG capsule Take 0.25 mcg by mouth every other day.    Yes Historical Provider, MD  calcitRIOL (ROCALTROL) 0.25 MCG capsule Take 0.5 mcg by mouth every other day.   Yes Historical Provider, MD  epoetin alfa (EPOGEN,PROCRIT) 62035 UNIT/ML injection Inject 40,000 Units into the skin every 30 (thirty) days.   Yes Historical Provider, MD  gabapentin (NEURONTIN) 100 MG capsule Take 200 mg by mouth at bedtime.    Yes Historical Provider, MD  levothyroxine (SYNTHROID, LEVOTHROID) 100 MCG tablet Take 100 mcg by mouth daily.   Yes Historical Provider, MD  metoprolol (LOPRESSOR) 100 MG tablet Take 100 mg by mouth 2 (two) times daily.   Yes Historical Provider, MD  mycophenolate (MYFORTIC) 180 MG EC tablet Take 360 mg by mouth 2 (two) times daily.     Yes Historical Provider, MD  NIFEdipine (PROCARDIA XL/ADALAT-CC) 60 MG 24 hr tablet Take 60 mg by mouth daily.     Yes Historical Provider, MD  omeprazole (PRILOSEC) 20 MG capsule Take 20 mg by mouth daily.    Yes Historical Provider, MD  polyethylene glycol (MIRALAX / GLYCOLAX) packet Take 17 g by mouth 2 (two) times daily as needed. For constipation   Yes Historical Provider, MD  predniSONE (DELTASONE) 5 MG tablet Take 5 mg by mouth daily.    Yes Historical Provider, MD  tacrolimus (PROGRAF) 1 MG capsule Take 1 mg by mouth 2 (two) times daily. With 48m to =637m  Yes Historical Provider, MD  tacrolimus  (PROGRAF) 5 MG capsule Take 5 mg by mouth 2 (two) times daily. With 15m66mo =6mg59mYes Historical Provider, MD    Review of Systems:  Constitutional:  No weight loss, night sweats, Fevers, chills, fatigue.  HEENT:  No headaches, Difficulty swallowing,Tooth/dental problems,Sore throat,  No sneezing, itching, ear ache, nasal congestion, post nasal drip,  Cardio-vascular:  No chest pain, Orthopnea, PND, swelling in lower extremities,  dizziness, palpitations  GI:  No heartburn, indigestion, abdominal pain, nausea, vomiting, diarrhea, Resp:  No shortness of breath with exertion or at rest. No excess mucus, no productive cough, No non-productive cough, No coughing up of blood.No change in color of mucus.No wheezing. Skin:  no rash or lesions.  GU:  no dysuria, change in color of urine, no urgency or frequency. No flank pain.  Musculoskeletal:  No joint pain or swelling. No decreased range of motion. No back pain.  Psych:  No change in mood or affect. No depression or anxiety. No memory loss. Physical   Exam: Filed Vitals:  12/20/11 1015 12/20/11 1318  BP: 145/68 128/63  Pulse: 86 87  Temp: 98.2 F (36.8 C) 98.4 F (36.9 C)  TempSrc: Oral Oral  Resp: 20 18  Height: 5' 3"  (1.6 m)   Weight: 62.596 kg (138 lb)   SpO2: 100% 100%   general: Alert and oriented x3. No apparent distress. Well-nourished well-developed HEENT: Normocephalic, no icterus, no thrush, no neck masses no conjunctival hemorrhage Cardiovascular: Regular in rhythm. No rubs gallops murmurs Lungs: Bibasilar crackles. No wheezes or rhonchi. Good air movement Abdomen: Soft, nontender, positive bowel sounds, no rebound Extremities: Trace edema right lower extremity. Tenderness to palpation of the right thigh. No rashes, no lymphangitis. Negative straight leg test bilateral Neurologic: Cranial nerves 2-12 grossly intact. No focal weakness. Strength 4/5 bilateral upper and lower extremities  Labs on Admission:  Basic  Metabolic Panel:  Lab 19/62/22 1255  NA 138  K 3.7  CL 101  CO2 26  GLUCOSE 96  BUN 30*  CREATININE 1.50*  CALCIUM 9.8  MG --  PHOS --   Liver Function Tests: No results found for this basename: AST:5,ALT:5,ALKPHOS:5,BILITOT:5,PROT:5,ALBUMIN:5 in the last 168 hours No results found for this basename: LIPASE:5,AMYLASE:5 in the last 168 hours No results found for this basename: AMMONIA:5 in the last 168 hours CBC:  Lab 12/20/11 1255  WBC 12.0*  NEUTROABS 9.4*  HGB 11.1*  HCT 34.6*  MCV 92.8  PLT 117*   Cardiac Enzymes: No results found for this basename: CKTOTAL:5,CKMB:5,CKMBINDEX:5,TROPONINI:5 in the last 168 hours BNP: No components found with this basename: POCBNP:5 CBG: No results found for this basename: GLUCAP:5 in the last 168 hours  Radiological Exams on Admission: US Venous Img Lower Unilateral Right  12/20/2011  **ADDENDUM** CREATED: 12/20/2011 11:57:15  These results were called by telephone on 12/20/2011 at 9798 hours to Dr Roxanne Mins, who verbally acknowledged these results.  **END ADDENDUM** SIGNED BY: Julian Hy, M.D.   12/20/2011  *RADIOLOGY REPORT*  Clinical Data: Leg pain  RIGHT LOWER EXTREMITY VENOUS DUPLEX ULTRASOUND  Technique:  Gray-scale sonography with graded compression, as well as color Doppler and duplex ultrasound, were performed to evaluate the deep venous system of the lower extremity from the level of the common femoral vein through the popliteal and proximal calf veins. Spectral Doppler was utilized to evaluate flow at rest and with distal augmentation maneuvers.  Comparison:  None.  Findings: Some flow is visible in the superficial femoral vein. The lower popliteal vein appears compressible.  Otherwise, there is thrombosis extending from the posterior tibial/peroneal veins in the calf centrally to the common femoral vein.  IMPRESSION: Deep venous thrombosis extending from the calf veins to the common femoral vein, as described above.  Original  Report Authenticated By: Julian Hy, M.D.    Time spend: 70 min Code Status: full   Mariska Daffin, DO  [facility] Pager [phone]  If 7PM-7AM, please contact night-coverage [email or web address] Password [facility] 12/20/2011, 5:03 PM

## 2011-12-21 DIAGNOSIS — I82409 Acute embolism and thrombosis of unspecified deep veins of unspecified lower extremity: Secondary | ICD-10-CM

## 2011-12-21 DIAGNOSIS — I824Y9 Acute embolism and thrombosis of unspecified deep veins of unspecified proximal lower extremity: Principal | ICD-10-CM

## 2011-12-21 DIAGNOSIS — N183 Chronic kidney disease, stage 3 unspecified: Secondary | ICD-10-CM

## 2011-12-21 DIAGNOSIS — I10 Essential (primary) hypertension: Secondary | ICD-10-CM

## 2011-12-21 DIAGNOSIS — I4891 Unspecified atrial fibrillation: Secondary | ICD-10-CM | POA: Diagnosis not present

## 2011-12-21 DIAGNOSIS — Z940 Kidney transplant status: Secondary | ICD-10-CM

## 2011-12-21 DIAGNOSIS — E039 Hypothyroidism, unspecified: Secondary | ICD-10-CM

## 2011-12-21 LAB — CBC
Hemoglobin: 11.2 g/dL — ABNORMAL LOW (ref 12.0–15.0)
MCH: 28.9 pg (ref 26.0–34.0)
MCV: 94.1 fL (ref 78.0–100.0)
Platelets: 115 10*3/uL — ABNORMAL LOW (ref 150–400)
RBC: 3.88 MIL/uL (ref 3.87–5.11)

## 2011-12-21 LAB — BASIC METABOLIC PANEL
BUN: 25 mg/dL — ABNORMAL HIGH (ref 6–23)
CO2: 29 mEq/L (ref 19–32)
Calcium: 9.6 mg/dL (ref 8.4–10.5)
Creatinine, Ser: 1.45 mg/dL — ABNORMAL HIGH (ref 0.50–1.10)
Glucose, Bld: 93 mg/dL (ref 70–99)

## 2011-12-21 MED ORDER — WARFARIN SODIUM 5 MG PO TABS: 5.0000 mg | ORAL_TABLET | Freq: Every day | ORAL | Status: DC

## 2011-12-21 MED ORDER — ENOXAPARIN SODIUM 150 MG/ML ~~LOC~~ SOLN: 60.0000 mg | Freq: Two times a day (BID) | SUBCUTANEOUS | Status: DC

## 2011-12-21 MED ORDER — ENOXAPARIN (LOVENOX) PATIENT EDUCATION KIT
PACK | Freq: Once | Status: AC
  Administered 2011-12-21: 16:00:00
  Filled 2011-12-21: qty 1

## 2011-12-21 MED ORDER — WARFARIN - PHARMACIST DOSING INPATIENT
Freq: Every day | Status: DC
  Administered 2011-12-21: 18:00:00

## 2011-12-21 MED ORDER — ENOXAPARIN (LOVENOX) PATIENT EDUCATION KIT: 1.0000 | PACK | Freq: Once | Status: DC

## 2011-12-21 MED ORDER — COUMADIN BOOK
Freq: Once | Status: AC
  Administered 2011-12-21: 16:00:00
  Filled 2011-12-21: qty 1

## 2011-12-21 MED ORDER — WARFARIN VIDEO
Freq: Once | Status: AC
  Administered 2011-12-21: 17:00:00

## 2011-12-21 MED ORDER — WARFARIN SODIUM 5 MG PO TABS
5.0000 mg | ORAL_TABLET | Freq: Once | ORAL | Status: AC
  Administered 2011-12-21: 5 mg via ORAL
  Filled 2011-12-21 (×2): qty 1

## 2011-12-21 NOTE — Progress Notes (Signed)
Education on Lovenox and Coumadin given. Pt educated on how to self injection Lovenox. Teach back method used. Pt able to verbalize understanding of both medications and self injection technique. Pt self injected scheduled Lovenox injection using correct technique.Lovenox education and Coumadin booklet given. Education videos (#108, #109) on blood thinners and Lovenox injection viewed by pt.  [name], Franky Macho

## 2011-12-21 NOTE — Progress Notes (Signed)
ANTICOAGULATION CONSULT NOTE - Initial Consult  Pharmacy Consult for Coumadin Indication: DVT  Allergies  Allergen Reactions  . Amoxicillin Anaphylaxis  . Ampicillin Anaphylaxis  . Ciprofloxacin Swelling    "of my throat"  . Erythromycin Anaphylaxis  . Penicillins Anaphylaxis  . Sulfa Antibiotics Itching  . Sulfamethoxazole W-Trimethoprim Itching  . Tetracycline Anaphylaxis  . Labetalol Nausea And Vomiting    Patient Measurements: Height: 5' 3"  (160 cm) Weight: 133 lb 3.2 oz (60.419 kg) IBW/kg (Calculated) : 52.4   Vital Signs: Temp: 98.3 F (36.8 C) (08/14 0923) Temp src: Oral (08/14 0923) BP: 100/53 mmHg (08/14 0923) Pulse Rate: 72  (08/14 0923)  Labs:  Basename 12/21/11 0600 12/20/11 1255  HGB 11.2* 11.1*  HCT 36.5 34.6*  PLT 115* 117*  APTT -- 42*  LABPROT 15.5* 14.3  INR 1.20 1.09  HEPARINUNFRC -- --  CREATININE 1.45* 1.50*  CKTOTAL -- --  CKMB -- --  TROPONINI -- --    Estimated Creatinine Clearance: 30.7 ml/min (by C-G formula based on Cr of 1.45).   Medical History: Past Medical History  Diagnosis Date  . Crohn's disease   . Hypertension   . Lupus   . Refusal of blood transfusion for reasons of conscience 12/20/2011    pt is Jehovah's Witness  . Atrial fibrillation   . DVT of leg (deep venous thrombosis) 2012-8./13/2013    "have had one in each leg now"  . Numbness and tingling of foot     bilaterally  . GERD (gastroesophageal reflux disease)   . OSA on CPAP   . Hypothyroidism   . Anemia     "when lupus flares up"  . Lower GI bleed 2012  . Arthritis     "in my joints"  . Renal disorder     S/P nephrectomy; dialysis; "working fine now" (12/20/11)  . Renal cell carcinoma     Medications:  Prescriptions prior to admission  Medication Sig Dispense Refill  . acetaminophen (TYLENOL) 325 MG tablet Take 650 mg by mouth every 6 (six) hours as needed. For pain      . amiodarone (PACERONE) 200 MG tablet Take 100 mg by mouth 2 (two) times  daily.        . calcitRIOL (ROCALTROL) 0.25 MCG capsule Take 0.25 mcg by mouth every other day.       . calcitRIOL (ROCALTROL) 0.25 MCG capsule Take 0.5 mcg by mouth every other day.      Marland Kitchen epoetin alfa (EPOGEN,PROCRIT) 16109 UNIT/ML injection Inject 40,000 Units into the skin every 30 (thirty) days.      Marland Kitchen gabapentin (NEURONTIN) 100 MG capsule Take 200 mg by mouth at bedtime.       Marland Kitchen levothyroxine (SYNTHROID, LEVOTHROID) 100 MCG tablet Take 100 mcg by mouth daily.      . metoprolol (LOPRESSOR) 100 MG tablet Take 100 mg by mouth 2 (two) times daily.      . mycophenolate (MYFORTIC) 180 MG EC tablet Take 360 mg by mouth 2 (two) times daily.        Marland Kitchen NIFEdipine (PROCARDIA XL/ADALAT-CC) 60 MG 24 hr tablet Take 60 mg by mouth daily.        Marland Kitchen omeprazole (PRILOSEC) 20 MG capsule Take 20 mg by mouth daily.       . polyethylene glycol (MIRALAX / GLYCOLAX) packet Take 17 g by mouth 2 (two) times daily as needed. For constipation      . predniSONE (DELTASONE) 5 MG tablet Take 5 mg by  mouth daily.       . tacrolimus (PROGRAF) 1 MG capsule Take 1 mg by mouth 2 (two) times daily. With 20m to =624m     . tacrolimus (PROGRAF) 5 MG capsule Take 5 mg by mouth 2 (two) times daily. With 3m38mo =6mg10m     Assessment: 70yof on Lovenox bridging to Coumadin (Day 1 of minimum 5 Day overlap) for acute DVT. Spoke with MD concerning discharge plan - recommended Coumadin 5mg 72mly with INR follow-up in 2-3 days. Patient is also on amiodarone which can increase INR - monitor closely. - Baseline INR: 1.2 - Hg 11.2, Plts 115 (stable) - No significant bleeding reported - Weight: 60kg - CrCl ~35  - Warfarin points: 4  Goal of Therapy:  INR 2-3 Anti-Xa level 0.6-1.2 units/ml 4hrs after LMWH dose given Monitor platelets by anticoagulation protocol: Yes   Plan:  1. Change Lovenox to 60mg 83m12h - continue until INR is therapeutic x 24hrs per guidelines 2. Coumadin 5mg po28m1 today - recommend 5mg dai33mfor discharge  with follow-up INR in 2-3 days 3. Daily INR 4. Coumadin educational book/video  [name],Earleen [id number],12:03 PM

## 2011-12-21 NOTE — Progress Notes (Addendum)
AVS reviewed with pt. Teach back method used. Pt able to verbalize understanding of AVS and questions answered. IV DC'd. Coumadin dose for today given. Pt remains stable. Pt escorted via wheelchair down to exit of facility. [name], [name]

## 2011-12-21 NOTE — Discharge Summary (Signed)
Physician Discharge Summary  Patient ID: [name] MRN: [id number] DOB/AGE: 1940-10-06 71 y.o.  Admit date: 12/20/2011 Discharge date: 12/21/2011  Discharge Diagnoses:   DVT of leg (deep venous thrombosis)  Atrial fibrillation  CKD (chronic kidney disease), stage III  History of renal transplant  HTN (hypertension)  Hypothyroidism  Recommendations for PCP on follow up:  Please check PT/INR and adjust warfarin dosing as needed, DC lovenox when PT/INR therapeutic  Discharged Condition: good  Hospital Course: From H&P:  Pleasant 71 year old female with two-day history of right thigh pain that began groin. In the emergency department, ultrasound the right lower extremity revealed deep vein thrombosis from the calf veins up to the right common femoral vein. The patient was given Lovenox 5 mg IV. The patient was admitted to the hospital service for further evaluation and treatment. Of note, the patient is a Restaurant manager, fast food. She states that she has had a remote "clot" in her left leg many years ago. In addition, the patient had a clot in her old AV fistula in her left thigh in August 2012. In addition, the patient has a history of a GI bleed in August 2012. Currently, the patient denies any chest pain, shortness breath, nausea, vomiting, diarrhea, abdominal pain, headaches, dizziness. The patient relates a history of Coumadin usage many years ago when she had a clot but has been weaned off. She does not know how long she took the coumadin. In addition, the patient had an IVC filter placed on 12/17/2010 after her GI bleed episode. Since her GI bleed episode, the patient has not been on aspirin, Plavix, or any other anticoagulation for her atrial fibrillation. The patient has a history of lupus. She states she does not formally followed by rheumatologist. The patient states that she previously took prednisone for lupus. The patient was previously on dialysis due to lupus. In addition she gives a  history of renal cell carcinoma her bilateral kidneys. She has undergone bilateral nephrectomies. The patient underwent a renal transplant in 2006. She has not required dialysis since that period of time. Her baseline creatinine is 1.6-1.9. After her renal transplant, the patient was placed on tacrolimus, mycophenolate, and prednisone. She is very compliant with his medications.  DVT right leg  -The patient has an IVC filter however she would benefit from warfarin therapy at this time given her history of lupus, afib,  as well as her acute DVT  -Risks, benefits, and alternatives of warfarin therapy and rivaroxaban were discussed including but not limited to life-threatening bleed. The patient's husband and son were at the bedside and all questions were answered to their satisfaction. -At this time, the patient is willing to take parenteral Lovenox until the PT/INR is therapeutic on warfarin -Pt and husband decided on long-term warfarin.  Pt will be discharged on 5 mg of warfarin daily with close follow up arranged for PT/INR test in 2 days and follow up with PCP Dr. Jimmy Footman in 5 days  -She does not want to take Xarelto at this time but will discuss with her PCP in the near future -Given the patient's history of lupus, she may benefit from indefinite anticoagulation  -Check frequent PT/INR until stable.  I called and arranged for her to have PT/INR checked in 2 days (at [facility]), then again on Monday, with all results to be called to Dr. Jimmy Footman per patient request. Pt says that Dr. Jimmy Footman has monitored her warfarin usage in the past and is the only doctor that she feels  comfortable managing her warfarin usage.   Atrial fibrillation  -Patient is currently in sinus rhythm  -TSH is 1.228  Hypertension  -Well controlled with metoprolol and nifedipine   CKD stage III  -She is status post renal transplant on immunosuppressive therapy  -Renal function remains stable with cr/cl around 35  S/p  Renal transplant   Hx renal cell carcinoma s/p bilateral nephrectomy  Discharge Exam: Pt is without complaints, leg pain is much better, pt says she is comfortable with giving lovenox injections, she says that she will follow up as recommended.  Had long discussion with her about warfarin, risks, benefits, etc and lovenox risks, benefits, etc and pt verbalized understanding.  Pt's husband verbalized understanding.  Blood pressure 100/53, pulse 72, temperature 98.3 F (36.8 C), temperature source Oral, resp. rate 20, height 5' 3"  (1.6 m), weight 60.419 kg (133 lb 3.2 oz), SpO2 98.00%. general: Alert and oriented x3. No apparent distress. Well-nourished well-developed  HEENT: Normocephalic, no icterus, no thrush, no neck masses no conjunctival hemorrhage  Cardiovascular: Regular in rhythm. No rubs gallops murmurs  Lungs:  No wheezes or rhonchi. BBS CTA Abdomen: Soft, nontender, positive bowel sounds, no rebound  Extremities: no CCE. No rashes, no lymphangitis. Negative straight leg test bilateral  Neurologic: Cranial nerves 2-12 grossly intact. No focal weakness. Strength 5/5 bilateral upper and lower extremities   Disposition: 01-Home or Self Care with close follow up arranged as above  Discharge Orders    Future Appointments: Provider: Department: Dept Phone: Center:   01/05/2012 1:30 PM [facility] [phone] None     Future Orders Please Complete By Expires   Increase activity slowly      Discharge instructions      Comments:   Continue taking lovenox injections every 12 hours until your primary care physician says to stop taking them when your PT is therapeutic (INR is 2.0-3.0) Call your physician if you have any bleeding problems or seek medical care immediately Return if symptoms recur, don't improve, worsen or new problems develop Go to [facility] in 2 days to have your PT/INR checked and then go on Monday to have it checked.  Have results sent to Dr.  Jimmy Footman.     Medication List  As of 12/21/2011 12:01 PM   TAKE these medications         acetaminophen 325 MG tablet   Commonly known as: TYLENOL   Take 650 mg by mouth every 6 (six) hours as needed. For pain      amiodarone 200 MG tablet   Commonly known as: PACERONE   Take 100 mg by mouth 2 (two) times daily.      calcitRIOL 0.25 MCG capsule   Commonly known as: ROCALTROL   Take 0.25 mcg by mouth every other day.      calcitRIOL 0.25 MCG capsule   Commonly known as: ROCALTROL   Take 0.5 mcg by mouth every other day.      enoxaparin 150 MG/ML injection   Commonly known as: LOVENOX   Inject 0.4 mLs (60 mg total) into the skin every 12 (twelve) hours.      enoxaparin Kit   Commonly known as: LOVENOX   1 kit by Does not apply route once.      epoetin alfa 40000 UNIT/ML injection   Commonly known as: EPOGEN,PROCRIT   Inject 40,000 Units into the skin every 30 (thirty) days.      gabapentin 100 MG capsule   Commonly known  as: NEURONTIN   Take 200 mg by mouth at bedtime.      levothyroxine 100 MCG tablet   Commonly known as: SYNTHROID, LEVOTHROID   Take 100 mcg by mouth daily.      metoprolol 100 MG tablet   Commonly known as: LOPRESSOR   Take 100 mg by mouth 2 (two) times daily.      mycophenolate 180 MG EC tablet   Commonly known as: MYFORTIC   Take 360 mg by mouth 2 (two) times daily.      NIFEdipine 60 MG 24 hr tablet   Commonly known as: PROCARDIA XL/ADALAT-CC   Take 60 mg by mouth daily.      omeprazole 20 MG capsule   Commonly known as: PRILOSEC   Take 20 mg by mouth daily.      polyethylene glycol packet   Commonly known as: MIRALAX / GLYCOLAX   Take 17 g by mouth 2 (two) times daily as needed. For constipation      predniSONE 5 MG tablet   Commonly known as: DELTASONE   Take 5 mg by mouth daily.      tacrolimus 5 MG capsule   Commonly known as: PROGRAF   Take 5 mg by mouth 2 (two) times daily. With 31m to =66m     tacrolimus 1 MG capsule    Commonly known as: PROGRAF   Take 1 mg by mouth 2 (two) times daily. With 75m38mo =6mg73m   warfarin 5 MG tablet   Commonly known as: COUMADIN   Take 1 tablet (5 mg total) by mouth daily.           Follow-up Information    Follow up with DETERDING,[name], MD. Schedule an appointment as soon as possible for a visit in 5 days. ([facility])    Contact information:   [phone]   Follow up with [facility] . (Friday and Monday for PT/INR check - Order already sent)         I spent 40 mins preparing discharge, reviewing records, labs, writing prescriptions, arranging follow ups, etc.  Signed: [place] 8[place]4/2013, 12:01 PM

## 2011-12-23 DIAGNOSIS — Z7901 Long term (current) use of anticoagulants: Secondary | ICD-10-CM | POA: Diagnosis not present

## 2011-12-26 DIAGNOSIS — I82409 Acute embolism and thrombosis of unspecified deep veins of unspecified lower extremity: Secondary | ICD-10-CM | POA: Diagnosis not present

## 2011-12-29 ENCOUNTER — Encounter (HOSPITAL_COMMUNITY): Payer: Medicare Other

## 2012-01-02 DIAGNOSIS — I749 Embolism and thrombosis of unspecified artery: Secondary | ICD-10-CM | POA: Diagnosis not present

## 2012-01-04 ENCOUNTER — Other Ambulatory Visit (HOSPITAL_COMMUNITY): Payer: Self-pay | Admitting: *Deleted

## 2012-01-05 ENCOUNTER — Encounter (HOSPITAL_COMMUNITY)
Admission: RE | Admit: 2012-01-05 | Discharge: 2012-01-05 | Disposition: A | Discharge status: Home / Self Care | Payer: Medicare Other | Source: Ambulatory Visit | Attending: Nephrology | Admitting: Nephrology

## 2012-01-05 DIAGNOSIS — Z940 Kidney transplant status: Secondary | ICD-10-CM | POA: Diagnosis not present

## 2012-01-05 DIAGNOSIS — N184 Chronic kidney disease, stage 4 (severe): Secondary | ICD-10-CM | POA: Diagnosis not present

## 2012-01-05 DIAGNOSIS — D638 Anemia in other chronic diseases classified elsewhere: Secondary | ICD-10-CM | POA: Diagnosis not present

## 2012-01-05 LAB — RENAL FUNCTION PANEL
BUN: 35 mg/dL — ABNORMAL HIGH (ref 6–23)
Calcium: 10 mg/dL (ref 8.4–10.5)
Glucose, Bld: 97 mg/dL (ref 70–99)
Phosphorus: 3.9 mg/dL (ref 2.3–4.6)

## 2012-01-05 LAB — IRON AND TIBC
Iron: 87 ug/dL (ref 42–135)
Saturation Ratios: 38 % (ref 20–55)
TIBC: 231 ug/dL — ABNORMAL LOW (ref 250–470)
UIBC: 144 ug/dL (ref 125–400)

## 2012-01-05 LAB — POCT HEMOGLOBIN-HEMACUE: Hemoglobin: 11.2 g/dL — ABNORMAL LOW (ref 12.0–15.0)

## 2012-01-05 MED ORDER — EPOETIN ALFA 10000 UNIT/ML IJ SOLN: 40000.0000 [IU] | INTRAMUSCULAR | Status: DC

## 2012-01-05 MED ORDER — EPOETIN ALFA 40000 UNIT/ML IJ SOLN
INTRAMUSCULAR | Status: AC
  Administered 2012-01-05: 40000 [IU] via SUBCUTANEOUS
  Filled 2012-01-05: qty 1

## 2012-01-06 LAB — PTH, INTACT AND CALCIUM: PTH: 28.1 pg/mL (ref 14.0–72.0)

## 2012-01-10 DIAGNOSIS — I749 Embolism and thrombosis of unspecified artery: Secondary | ICD-10-CM | POA: Diagnosis not present

## 2012-01-17 DIAGNOSIS — N390 Urinary tract infection, site not specified: Secondary | ICD-10-CM | POA: Diagnosis not present

## 2012-01-24 DIAGNOSIS — N184 Chronic kidney disease, stage 4 (severe): Secondary | ICD-10-CM | POA: Diagnosis not present

## 2012-02-02 ENCOUNTER — Encounter (HOSPITAL_COMMUNITY): Payer: Medicare Other

## 2012-02-06 DIAGNOSIS — E039 Hypothyroidism, unspecified: Secondary | ICD-10-CM | POA: Diagnosis not present

## 2012-02-06 DIAGNOSIS — D649 Anemia, unspecified: Secondary | ICD-10-CM | POA: Diagnosis not present

## 2012-02-06 DIAGNOSIS — Z79899 Other long term (current) drug therapy: Secondary | ICD-10-CM | POA: Diagnosis not present

## 2012-02-06 DIAGNOSIS — M329 Systemic lupus erythematosus, unspecified: Secondary | ICD-10-CM | POA: Diagnosis not present

## 2012-02-06 DIAGNOSIS — I749 Embolism and thrombosis of unspecified artery: Secondary | ICD-10-CM | POA: Diagnosis not present

## 2012-02-06 DIAGNOSIS — N390 Urinary tract infection, site not specified: Secondary | ICD-10-CM | POA: Diagnosis not present

## 2012-02-06 DIAGNOSIS — Z940 Kidney transplant status: Secondary | ICD-10-CM | POA: Diagnosis not present

## 2012-02-16 ENCOUNTER — Encounter (HOSPITAL_COMMUNITY)
Admission: RE | Admit: 2012-02-16 | Discharge: 2012-02-16 | Disposition: A | Discharge status: Home / Self Care | Payer: Medicare Other | Source: Ambulatory Visit | Attending: Nephrology | Admitting: Nephrology

## 2012-02-16 DIAGNOSIS — N184 Chronic kidney disease, stage 4 (severe): Secondary | ICD-10-CM | POA: Insufficient documentation

## 2012-02-16 DIAGNOSIS — D638 Anemia in other chronic diseases classified elsewhere: Secondary | ICD-10-CM | POA: Diagnosis not present

## 2012-02-16 DIAGNOSIS — Z940 Kidney transplant status: Secondary | ICD-10-CM | POA: Insufficient documentation

## 2012-02-16 LAB — RENAL FUNCTION PANEL
BUN: 37 mg/dL — ABNORMAL HIGH (ref 6–23)
Calcium: 10.2 mg/dL (ref 8.4–10.5)
GFR calc Af Amer: 32 mL/min — ABNORMAL LOW (ref >90)
Glucose, Bld: 102 mg/dL — ABNORMAL HIGH (ref 70–99)
Phosphorus: 3.9 mg/dL (ref 2.3–4.6)
Sodium: 140 mEq/L (ref 135–145)

## 2012-02-16 LAB — POCT HEMOGLOBIN-HEMACUE: Hemoglobin: 12.9 g/dL (ref 12.0–15.0)

## 2012-02-16 LAB — IRON AND TIBC: UIBC: 123 ug/dL — ABNORMAL LOW (ref 125–400)

## 2012-02-16 MED ORDER — EPOETIN ALFA 10000 UNIT/ML IJ SOLN: 40000.0000 [IU] | INTRAMUSCULAR | Status: DC

## 2012-02-17 LAB — PTH, INTACT AND CALCIUM
Calcium, Total (PTH): 10 mg/dL (ref 8.4–10.5)
PTH: 19.5 pg/mL (ref 14.0–72.0)

## 2012-02-20 DIAGNOSIS — Z940 Kidney transplant status: Secondary | ICD-10-CM | POA: Diagnosis not present

## 2012-02-20 DIAGNOSIS — I749 Embolism and thrombosis of unspecified artery: Secondary | ICD-10-CM | POA: Diagnosis not present

## 2012-03-01 ENCOUNTER — Encounter (HOSPITAL_COMMUNITY): Payer: Medicare Other

## 2012-03-02 DIAGNOSIS — N390 Urinary tract infection, site not specified: Secondary | ICD-10-CM | POA: Diagnosis not present

## 2012-03-02 DIAGNOSIS — I749 Embolism and thrombosis of unspecified artery: Secondary | ICD-10-CM | POA: Diagnosis not present

## 2012-03-08 ENCOUNTER — Encounter (HOSPITAL_COMMUNITY): Payer: Medicare Other

## 2012-03-09 DIAGNOSIS — N390 Urinary tract infection, site not specified: Secondary | ICD-10-CM | POA: Diagnosis not present

## 2012-03-15 ENCOUNTER — Encounter (HOSPITAL_COMMUNITY): Payer: Medicare Other

## 2012-03-21 ENCOUNTER — Other Ambulatory Visit (HOSPITAL_COMMUNITY): Payer: Self-pay | Admitting: *Deleted

## 2012-03-22 ENCOUNTER — Encounter (HOSPITAL_COMMUNITY)
Admission: RE | Admit: 2012-03-22 | Discharge: 2012-03-22 | Disposition: A | Discharge status: Home / Self Care | Payer: Medicare Other | Source: Ambulatory Visit | Attending: Nephrology | Admitting: Nephrology

## 2012-03-22 DIAGNOSIS — N184 Chronic kidney disease, stage 4 (severe): Secondary | ICD-10-CM | POA: Diagnosis not present

## 2012-03-22 DIAGNOSIS — Z940 Kidney transplant status: Secondary | ICD-10-CM | POA: Insufficient documentation

## 2012-03-22 DIAGNOSIS — D638 Anemia in other chronic diseases classified elsewhere: Secondary | ICD-10-CM | POA: Diagnosis not present

## 2012-03-22 LAB — IRON AND TIBC: Iron: 69 ug/dL (ref 42–135)

## 2012-03-22 MED ORDER — EPOETIN ALFA 40000 UNIT/ML IJ SOLN
INTRAMUSCULAR | Status: AC
  Administered 2012-03-22: 40000 [IU] via SUBCUTANEOUS
  Filled 2012-03-22: qty 1

## 2012-03-22 MED ORDER — EPOETIN ALFA 10000 UNIT/ML IJ SOLN: 40000.0000 [IU] | INTRAMUSCULAR | Status: DC

## 2012-03-23 DIAGNOSIS — Z7901 Long term (current) use of anticoagulants: Secondary | ICD-10-CM | POA: Diagnosis not present

## 2012-03-23 DIAGNOSIS — D696 Thrombocytopenia, unspecified: Secondary | ICD-10-CM | POA: Diagnosis not present

## 2012-03-27 DIAGNOSIS — Z79899 Other long term (current) drug therapy: Secondary | ICD-10-CM | POA: Diagnosis not present

## 2012-03-27 DIAGNOSIS — I824Y9 Acute embolism and thrombosis of unspecified deep veins of unspecified proximal lower extremity: Secondary | ICD-10-CM | POA: Diagnosis not present

## 2012-03-27 DIAGNOSIS — I4891 Unspecified atrial fibrillation: Secondary | ICD-10-CM | POA: Diagnosis not present

## 2012-03-27 DIAGNOSIS — I10 Essential (primary) hypertension: Secondary | ICD-10-CM | POA: Diagnosis not present

## 2012-03-27 DIAGNOSIS — E079 Disorder of thyroid, unspecified: Secondary | ICD-10-CM | POA: Diagnosis not present

## 2012-03-27 DIAGNOSIS — I4510 Unspecified right bundle-branch block: Secondary | ICD-10-CM | POA: Diagnosis not present

## 2012-03-27 DIAGNOSIS — D649 Anemia, unspecified: Secondary | ICD-10-CM | POA: Diagnosis not present

## 2012-04-10 ENCOUNTER — Ambulatory Visit
Admission: RE | Admit: 2012-04-10 | Discharge: 2012-04-10 | Disposition: A | Discharge status: Home / Self Care | Payer: Medicare Other | Source: Ambulatory Visit | Attending: Nephrology | Admitting: Nephrology

## 2012-04-10 ENCOUNTER — Other Ambulatory Visit: Payer: Self-pay | Admitting: Nephrology

## 2012-04-10 DIAGNOSIS — R05 Cough: Secondary | ICD-10-CM | POA: Diagnosis not present

## 2012-04-10 DIAGNOSIS — R0989 Other specified symptoms and signs involving the circulatory and respiratory systems: Secondary | ICD-10-CM

## 2012-04-19 ENCOUNTER — Encounter (HOSPITAL_COMMUNITY)
Admission: RE | Admit: 2012-04-19 | Discharge: 2012-04-19 | Disposition: A | Discharge status: Home / Self Care | Payer: Medicare Other | Source: Ambulatory Visit | Attending: Nephrology | Admitting: Nephrology

## 2012-04-19 DIAGNOSIS — N184 Chronic kidney disease, stage 4 (severe): Secondary | ICD-10-CM | POA: Insufficient documentation

## 2012-04-19 DIAGNOSIS — D638 Anemia in other chronic diseases classified elsewhere: Secondary | ICD-10-CM | POA: Insufficient documentation

## 2012-04-19 DIAGNOSIS — Z940 Kidney transplant status: Secondary | ICD-10-CM | POA: Insufficient documentation

## 2012-04-19 LAB — RENAL FUNCTION PANEL
BUN: 27 mg/dL — ABNORMAL HIGH (ref 6–23)
CO2: 23 mEq/L (ref 19–32)
Chloride: 101 mEq/L (ref 96–112)
Creatinine, Ser: 1.65 mg/dL — ABNORMAL HIGH (ref 0.50–1.10)
Glucose, Bld: 99 mg/dL (ref 70–99)

## 2012-04-19 LAB — POCT HEMOGLOBIN-HEMACUE: Hemoglobin: 11.5 g/dL — ABNORMAL LOW (ref 12.0–15.0)

## 2012-04-19 MED ORDER — EPOETIN ALFA 40000 UNIT/ML IJ SOLN
INTRAMUSCULAR | Status: AC
  Administered 2012-04-19: 40000 [IU] via SUBCUTANEOUS
  Filled 2012-04-19: qty 1

## 2012-04-19 MED ORDER — EPOETIN ALFA 10000 UNIT/ML IJ SOLN: 40000.0000 [IU] | INTRAMUSCULAR | Status: DC

## 2012-04-20 LAB — IRON AND TIBC
Saturation Ratios: 32 % (ref 20–55)
TIBC: 229 ug/dL — ABNORMAL LOW (ref 250–470)

## 2012-04-23 ENCOUNTER — Inpatient Hospital Stay (HOSPITAL_BASED_OUTPATIENT_CLINIC_OR_DEPARTMENT_OTHER)
Admission: EM | Admit: 2012-04-23 | Discharge: 2012-04-28 | DRG: 389 | Disposition: A | Discharge status: Home / Self Care | Payer: Medicare Other | Attending: Internal Medicine | Admitting: Internal Medicine

## 2012-04-23 ENCOUNTER — Emergency Department (HOSPITAL_BASED_OUTPATIENT_CLINIC_OR_DEPARTMENT_OTHER): Payer: Medicare Other

## 2012-04-23 ENCOUNTER — Encounter (HOSPITAL_BASED_OUTPATIENT_CLINIC_OR_DEPARTMENT_OTHER): Payer: Self-pay | Admitting: *Deleted

## 2012-04-23 DIAGNOSIS — D72829 Elevated white blood cell count, unspecified: Secondary | ICD-10-CM | POA: Diagnosis present

## 2012-04-23 DIAGNOSIS — E871 Hypo-osmolality and hyponatremia: Secondary | ICD-10-CM | POA: Diagnosis not present

## 2012-04-23 DIAGNOSIS — K5090 Crohn's disease, unspecified, without complications: Secondary | ICD-10-CM | POA: Diagnosis present

## 2012-04-23 DIAGNOSIS — I10 Essential (primary) hypertension: Secondary | ICD-10-CM | POA: Diagnosis present

## 2012-04-23 DIAGNOSIS — I82409 Acute embolism and thrombosis of unspecified deep veins of unspecified lower extremity: Secondary | ICD-10-CM | POA: Diagnosis present

## 2012-04-23 DIAGNOSIS — K219 Gastro-esophageal reflux disease without esophagitis: Secondary | ICD-10-CM | POA: Diagnosis present

## 2012-04-23 DIAGNOSIS — Z7901 Long term (current) use of anticoagulants: Secondary | ICD-10-CM

## 2012-04-23 DIAGNOSIS — E039 Hypothyroidism, unspecified: Secondary | ICD-10-CM

## 2012-04-23 DIAGNOSIS — Z881 Allergy status to other antibiotic agents status: Secondary | ICD-10-CM

## 2012-04-23 DIAGNOSIS — G4730 Sleep apnea, unspecified: Secondary | ICD-10-CM

## 2012-04-23 DIAGNOSIS — K56609 Unspecified intestinal obstruction, unspecified as to partial versus complete obstruction: Principal | ICD-10-CM | POA: Diagnosis present

## 2012-04-23 DIAGNOSIS — G4733 Obstructive sleep apnea (adult) (pediatric): Secondary | ICD-10-CM | POA: Diagnosis present

## 2012-04-23 DIAGNOSIS — K56600 Partial intestinal obstruction, unspecified as to cause: Secondary | ICD-10-CM | POA: Diagnosis present

## 2012-04-23 DIAGNOSIS — D6489 Other specified anemias: Secondary | ICD-10-CM | POA: Diagnosis present

## 2012-04-23 DIAGNOSIS — Z880 Allergy status to penicillin: Secondary | ICD-10-CM

## 2012-04-23 DIAGNOSIS — Z86718 Personal history of other venous thrombosis and embolism: Secondary | ICD-10-CM

## 2012-04-23 DIAGNOSIS — IMO0002 Reserved for concepts with insufficient information to code with codable children: Secondary | ICD-10-CM

## 2012-04-23 DIAGNOSIS — M329 Systemic lupus erythematosus, unspecified: Secondary | ICD-10-CM | POA: Diagnosis present

## 2012-04-23 DIAGNOSIS — I129 Hypertensive chronic kidney disease with stage 1 through stage 4 chronic kidney disease, or unspecified chronic kidney disease: Secondary | ICD-10-CM | POA: Diagnosis present

## 2012-04-23 DIAGNOSIS — Z940 Kidney transplant status: Secondary | ICD-10-CM

## 2012-04-23 DIAGNOSIS — Z882 Allergy status to sulfonamides status: Secondary | ICD-10-CM

## 2012-04-23 DIAGNOSIS — K5989 Other specified functional intestinal disorders: Secondary | ICD-10-CM | POA: Diagnosis not present

## 2012-04-23 DIAGNOSIS — R109 Unspecified abdominal pain: Secondary | ICD-10-CM | POA: Diagnosis not present

## 2012-04-23 DIAGNOSIS — Z79899 Other long term (current) drug therapy: Secondary | ICD-10-CM

## 2012-04-23 DIAGNOSIS — N183 Chronic kidney disease, stage 3 unspecified: Secondary | ICD-10-CM | POA: Diagnosis present

## 2012-04-23 DIAGNOSIS — Z85528 Personal history of other malignant neoplasm of kidney: Secondary | ICD-10-CM

## 2012-04-23 DIAGNOSIS — Z888 Allergy status to other drugs, medicaments and biological substances status: Secondary | ICD-10-CM

## 2012-04-23 DIAGNOSIS — N289 Disorder of kidney and ureter, unspecified: Secondary | ICD-10-CM

## 2012-04-23 DIAGNOSIS — I749 Embolism and thrombosis of unspecified artery: Secondary | ICD-10-CM | POA: Diagnosis not present

## 2012-04-23 DIAGNOSIS — I4891 Unspecified atrial fibrillation: Secondary | ICD-10-CM | POA: Diagnosis present

## 2012-04-23 HISTORY — DX: Acute embolism and thrombosis of unspecified deep veins of lower extremity, bilateral: I82.403

## 2012-04-23 HISTORY — DX: Systemic lupus erythematosus, unspecified: M32.9

## 2012-04-23 LAB — CBC WITH DIFFERENTIAL/PLATELET
Basophils Absolute: 0 10*3/uL (ref 0.0–0.1)
Basophils Relative: 0 % (ref 0–1)
Eosinophils Absolute: 0 K/uL (ref 0.0–0.7)
Eosinophils Relative: 0 % (ref 0–5)
HCT: 35.8 % — ABNORMAL LOW (ref 36.0–46.0)
Hemoglobin: 11.4 g/dL — ABNORMAL LOW (ref 12.0–15.0)
Lymphocytes Relative: 6 % — ABNORMAL LOW (ref 12–46)
Lymphs Abs: 1 K/uL (ref 0.7–4.0)
MCH: 28.9 pg (ref 26.0–34.0)
MCHC: 31.8 g/dL (ref 30.0–36.0)
MCV: 90.9 fL (ref 78.0–100.0)
Monocytes Absolute: 1.1 K/uL — ABNORMAL HIGH (ref 0.1–1.0)
Monocytes Relative: 7 % (ref 3–12)
Neutro Abs: 13 10*3/uL — ABNORMAL HIGH (ref 1.7–7.7)
Neutrophils Relative %: 86 % — ABNORMAL HIGH (ref 43–77)
Platelets: 177 K/uL (ref 150–400)
RBC: 3.94 MIL/uL (ref 3.87–5.11)
RDW: 15.4 % (ref 11.5–15.5)
WBC: 15.1 10*3/uL — ABNORMAL HIGH (ref 4.0–10.5)

## 2012-04-23 LAB — PROTIME-INR
INR: 1.33 (ref 0.00–1.49)
Prothrombin Time: 16.2 s — ABNORMAL HIGH (ref 11.6–15.2)

## 2012-04-23 LAB — URINALYSIS, ROUTINE W REFLEX MICROSCOPIC
Bilirubin Urine: NEGATIVE
Glucose, UA: NEGATIVE mg/dL
Hgb urine dipstick: NEGATIVE
Ketones, ur: NEGATIVE mg/dL
Nitrite: NEGATIVE
Protein, ur: NEGATIVE mg/dL
Specific Gravity, Urine: 1.02 (ref 1.005–1.030)
Urobilinogen, UA: 0.2 mg/dL (ref 0.0–1.0)
pH: 5 (ref 5.0–8.0)

## 2012-04-23 LAB — COMPREHENSIVE METABOLIC PANEL WITH GFR
ALT: 9 U/L (ref 0–35)
BUN: 28 mg/dL — ABNORMAL HIGH (ref 6–23)
CO2: 22 meq/L (ref 19–32)
Calcium: 9.9 mg/dL (ref 8.4–10.5)
Creatinine, Ser: 1.7 mg/dL — ABNORMAL HIGH (ref 0.50–1.10)
GFR calc Af Amer: 35 mL/min — ABNORMAL LOW (ref >90)
GFR calc non Af Amer: 30 mL/min — ABNORMAL LOW (ref >90)
Glucose, Bld: 140 mg/dL — ABNORMAL HIGH (ref 70–99)
Sodium: 135 meq/L (ref 135–145)
Total Protein: 7.6 g/dL (ref 6.0–8.3)

## 2012-04-23 LAB — COMPREHENSIVE METABOLIC PANEL
AST: 13 U/L (ref 0–37)
Albumin: 4.2 g/dL (ref 3.5–5.2)
Alkaline Phosphatase: 141 U/L — ABNORMAL HIGH (ref 39–117)
Chloride: 99 mEq/L (ref 96–112)
Potassium: 4.3 mEq/L (ref 3.5–5.1)
Total Bilirubin: 0.4 mg/dL (ref 0.3–1.2)

## 2012-04-23 LAB — URINE MICROSCOPIC-ADD ON

## 2012-04-23 LAB — LIPASE, BLOOD: Lipase: 19 U/L (ref 11–59)

## 2012-04-23 LAB — APTT: aPTT: 42 s — ABNORMAL HIGH (ref 24–37)

## 2012-04-23 MED ORDER — ONDANSETRON HCL 4 MG/2ML IJ SOLN
4.0000 mg | Freq: Once | INTRAMUSCULAR | Status: AC
  Administered 2012-04-23: 4 mg via INTRAVENOUS

## 2012-04-23 MED ORDER — HYDROMORPHONE HCL PF 1 MG/ML IJ SOLN
0.5000 mg | Freq: Once | INTRAMUSCULAR | Status: AC
  Administered 2012-04-23: 0.5 mg via INTRAVENOUS
  Filled 2012-04-23: qty 1

## 2012-04-23 MED ORDER — SODIUM CHLORIDE 0.9 % IV SOLN
Freq: Once | INTRAVENOUS | Status: AC
  Administered 2012-04-23: 19:00:00 via INTRAVENOUS

## 2012-04-23 MED ORDER — IOHEXOL 300 MG/ML  SOLN
50.0000 mL | Freq: Once | INTRAMUSCULAR | Status: AC | PRN
  Administered 2012-04-23: 50 mL via ORAL

## 2012-04-23 MED ORDER — ONDANSETRON HCL 4 MG/2ML IJ SOLN
4.0000 mg | Freq: Once | INTRAMUSCULAR | Status: AC
  Administered 2012-04-23: 4 mg via INTRAVENOUS
  Filled 2012-04-23: qty 2

## 2012-04-23 MED ORDER — ONDANSETRON HCL 4 MG/2ML IJ SOLN
INTRAMUSCULAR | Status: AC
  Filled 2012-04-23: qty 2

## 2012-04-23 MED ORDER — HYDROMORPHONE HCL PF 1 MG/ML IJ SOLN
1.0000 mg | Freq: Once | INTRAMUSCULAR | Status: AC
  Administered 2012-04-23: 1 mg via INTRAVENOUS
  Filled 2012-04-23: qty 1

## 2012-04-23 NOTE — ED Notes (Signed)
Patient transported to CT 

## 2012-04-23 NOTE — ED Notes (Signed)
Pt drinking contrast for abdominal ct

## 2012-04-23 NOTE — ED Notes (Signed)
Pt c/o abd pain and nausea x 7 hrs

## 2012-04-23 NOTE — ED Notes (Signed)
Pt. Actively vomiting approx. 30 mins ago with abd. Pain..  Meds given

## 2012-04-23 NOTE — ED Notes (Signed)
Pt vomiting, unable to tolerate constrast. Medication given, md notified

## 2012-04-23 NOTE — ED Provider Notes (Addendum)
History   This chart was scribed for Saddie Benders. Dorna Mai, MD, by Joline Maxcy, ER scribe. The patient was seen in room MH07/MH07 and the patient's care was started at 1756.    CSN: [id number]  Arrival date & time 04/23/12  1734   First MD Initiated Contact with Patient 04/23/12 1756      Chief Complaint  Patient presents with  . Abdominal Pain    (Consider location/radiation/quality/duration/timing/severity/associated sxs/prior treatment) HPI Comments: [name] is a 71 y.o. female who presents to the Emergency Department complaining of constant, sever abdominal pain with associated nausea, emesis,  and decreased appetite that began yesterday. She denies any associated diarrhea and reports that she has been more constipated. She has a h/o of kidney transplant, dialysis from 1988-2006, lupus, renal cell carcinoma, cholecystectomy, and Crohn's disease. She reports an associated hernia after the transplant that is generally reducible, but seems more consistently out than in especially with bloating recently.     PCP is Dr. Jimmy Footman.   Past Medical History  Diagnosis Date  . Crohn's disease   . Hypertension   . Lupus   . Refusal of blood transfusion for reasons of conscience 12/20/2011    pt is Jehovah's Witness  . Atrial fibrillation   . DVT of leg (deep venous thrombosis) 2012-8./13/2013    "have had one in each leg now"  . Numbness and tingling of foot     bilaterally  . GERD (gastroesophageal reflux disease)   . OSA on CPAP   . Hypothyroidism   . Anemia     "when lupus flares up"  . Lower GI bleed 2012  . Arthritis     "in my joints"  . Renal disorder     S/P nephrectomy; dialysis; "working fine now" (12/20/11)  . Renal cell carcinoma     Past Surgical History  Procedure Date  . Cesarean section 1984  . Nephrectomy transplanted organ 2006    bilaterally  . Eye surgery   . Excisional hemorrhoidectomy   . Cholecystectomy 1990's  . Cataract extraction w/  intraocular lens  implant, bilateral 2010  . Vena cava filter placement 2012  . Insertion of dialysis catheter 1988-2006    "peritoneal and hemodialysis; had multiple grafts and fistulas; last graft clotted off 2012"    History reviewed. No pertinent family history.  History  Substance Use Topics  . Smoking status: Never Smoker   . Smokeless tobacco: Never Used  . Alcohol Use: No    OB History    Grav Para Term Preterm Abortions TAB SAB Ect Mult Living                  Review of Systems  Constitutional: Negative for fever and chills.  Respiratory: Negative for shortness of breath.   Cardiovascular: Negative for chest pain.  Gastrointestinal: Positive for nausea, vomiting and abdominal pain.  Genitourinary: Negative for dysuria.  All other systems reviewed and are negative.    Allergies  Amoxicillin; Ampicillin; Ciprofloxacin; Erythromycin; Penicillins; Sulfa antibiotics; Sulfamethoxazole w-trimethoprim; Tetracycline; and Labetalol  Home Medications   Current Outpatient Rx  Name  Route  Sig  Dispense  Refill  . ACETAMINOPHEN 325 MG PO TABS   Oral   Take 650 mg by mouth every 6 (six) hours as needed. For pain         . AMIODARONE HCL 200 MG PO TABS   Oral   Take 100 mg by mouth 2 (two) times daily.           Marland Kitchen  CALCITRIOL 0.25 MCG PO CAPS   Oral   Take 0.25 mcg by mouth every other day.          Marland Kitchen CALCITRIOL 0.25 MCG PO CAPS   Oral   Take 0.5 mcg by mouth every other day.         Marland Kitchen ENOXAPARIN SODIUM 150 MG/ML [place] SOLN   Subcutaneous   Inject 0.4 mLs (60 mg total) into the skin every 12 (twelve) hours.   10 Syringe   0   . ENOXAPARIN (LOVENOX) PATIENT EDUCATION KIT   Does not apply   1 kit by Does not apply route once.   1 kit   0   . EPOETIN ALFA 78676 UNIT/ML IJ SOLN   Subcutaneous   Inject 40,000 Units into the skin every 30 (thirty) days.         Marland Kitchen GABAPENTIN 100 MG PO CAPS   Oral   Take 200 mg by mouth at bedtime.          Marland Kitchen  LEVOTHYROXINE SODIUM 100 MCG PO TABS   Oral   Take 100 mcg by mouth daily.         Marland Kitchen METOPROLOL TARTRATE 100 MG PO TABS   Oral   Take 100 mg by mouth 2 (two) times daily.         Marland Kitchen MYCOPHENOLATE SODIUM 180 MG PO TBEC   Oral   Take 360 mg by mouth 2 (two) times daily.           Marland Kitchen NIFEDIPINE ER OSMOTIC 60 MG PO TB24   Oral   Take 60 mg by mouth daily.           Marland Kitchen OMEPRAZOLE 20 MG PO CPDR   Oral   Take 20 mg by mouth daily.          Marland Kitchen POLYETHYLENE GLYCOL 3350 PO PACK   Oral   Take 17 g by mouth 2 (two) times daily as needed. For constipation         . PREDNISONE 5 MG PO TABS   Oral   Take 5 mg by mouth daily.          Marland Kitchen TACROLIMUS 1 MG PO CAPS   Oral   Take 1 mg by mouth 2 (two) times daily. With 56m to =652m        . TACROLIMUS 5 MG PO CAPS   Oral   Take 5 mg by mouth 2 (two) times daily. With 79m279mo =6mg45m      . WARFARIN SODIUM 5 MG PO TABS   Oral   Take 1 tablet (5 mg total) by mouth daily.   20 tablet   0     BP 165/80  Pulse 80  Temp 97.7 F (36.5 C) (Oral)  Resp 18  Ht 5' 3"  (1.6 m)  Wt 138 lb (62.596 kg)  BMI 24.45 kg/m2  SpO2 100%  Physical Exam  Nursing note and vitals reviewed. Constitutional: She is oriented to person, place, and time. She appears well-developed and well-nourished.  HENT:  Head: Normocephalic and atraumatic.  Eyes: No scleral icterus.  Neck: Normal range of motion. Neck supple.  Cardiovascular: Normal rate and regular rhythm.   Pulmonary/Chest: Effort normal. No respiratory distress. She has no wheezes.  Abdominal: Soft. There is tenderness. There is guarding. There is no rebound.  Neurological: She is alert and oriented to person, place, and time. Coordination normal.  Skin: Skin is warm and  dry. No rash noted. She is not diaphoretic.  Psychiatric: She has a normal mood and affect.    ED Course  Procedures (including critical care time)  CRITICAL CARE Performed by: Donzetta Matters.   Total  critical care time: 30 min  Critical care time was exclusive of separately billable procedures and treating other patients.  Critical care was necessary to treat or prevent imminent or life-threatening deterioration.  Critical care was time spent personally by me on the following activities: development of treatment plan with patient and/or surrogate as well as nursing, discussions with consultants, evaluation of patient's response to treatment, examination of patient, obtaining history from patient or surrogate, ordering and performing treatments and interventions, ordering and review of laboratory studies, ordering and review of radiographic studies, pulse oximetry and re-evaluation of patient's condition.   DIAGNOSTIC STUDIES: Oxygen Saturation is 99% on room air, normal by my interpretation.    COORDINATION OF CARE:  18:40- Discussed planned course of treatment with the patient, including a pelvic CT, blood work, and a UA, who is agreeable at this time.  18:45- Medication Orders- 0.9% sodium chloride infusion- Once, ondansetron (Zofran) injection 4 mg-Once, hydromorphone (Dilaudid) injection 1 mg- Once.  2030- Medication Orders- Hydromorphone (Dilaudid) injection 1 mg-Once.  22:00- Medication Orders- ondansetron (Zofran) injection 4 mg- Once.  23:00- Medication Orders- Hydromorphone (Dilaudid) injection 0.5 mg- Once.  23:11- Medication Orders- iohexol (Omnipaque) 300 mg/ml solution 50 mL- Once  Labs Reviewed  URINALYSIS, ROUTINE W REFLEX MICROSCOPIC - Abnormal; Notable for the following:    Leukocytes, UA MODERATE (*)     All other components within normal limits  URINE MICROSCOPIC-ADD ON - Abnormal; Notable for the following:    Bacteria, UA FEW (*)     All other components within normal limits  CBC WITH DIFFERENTIAL - Abnormal; Notable for the following:    WBC 15.1 (*)     Hemoglobin 11.4 (*)     HCT 35.8 (*)     Neutrophils Relative 86 (*)     Neutro Abs 13.0 (*)      Lymphocytes Relative 6 (*)     Monocytes Absolute 1.1 (*)     All other components within normal limits  COMPREHENSIVE METABOLIC PANEL - Abnormal; Notable for the following:    Glucose, Bld 140 (*)     BUN 28 (*)     Creatinine, Ser 1.70 (*)     Alkaline Phosphatase 141 (*)     GFR calc non Af Amer 30 (*)     GFR calc Af Amer 35 (*)     All other components within normal limits  PROTIME-INR - Abnormal; Notable for the following:    Prothrombin Time 16.2 (*)     All other components within normal limits  APTT - Abnormal; Notable for the following:    aPTT 42 (*)     All other components within normal limits  LIPASE, BLOOD  URINE CULTURE   Ct Abdomen Pelvis Wo Contrast  04/23/2012  *RADIOLOGY REPORT*  Clinical Data: Abdominopelvic pain, nausea/vomiting, history of renal transplant  CT ABDOMEN AND PELVIS WITHOUT CONTRAST  Technique:  Multidetector CT imaging of the abdomen and pelvis was performed following the standard protocol without intravenous contrast.  Comparison: 12/11/2010  Findings: Linear scarring at the lung bases with calcifications along the right hemidiaphragm.  Coronary atherosclerosis.  Stable 1.5 x 1.8 cm right retrocrural fluid density lesion (series 2/image 8), likely benign.  Stable probable cyst in the posterior segment right hepatic lobe (  series 2/image 15).  Calcified splenic granulomata with stable faint capsular calcification.  Pancreas is atrophic.  Bilateral adrenal glands are unremarkable.  Status post cholecystectomy.  No intrahepatic or extrahepatic ductal dilatation.  Status post bilateral nephrectomy.  Right lower quadrant renal transplant without hydronephrosis.  Multiple dilated loops of small bowel in the right mid/lower abdomen, measuring up to 5.0 cm (series 2/image 39).  The terminal ileum is decompressed (series 2/image 47). Colon is relatively decompressed.  While no definite focal transition is visualized, the appearance is worrisome for at least partial  small bowel obstruction.  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  Atherosclerotic calcifications of the abdominal aorta and branch vessels.  No abdominopelvic ascites.  No suspicious abdominopelvic lymphadenopathy.  Uterus is unremarkable.  No adnexal masses.  Bladder is within normal limits.  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  No focal osseous lesions.  IMPRESSION: Multiple dilated loops of small bowel in the right mid/lower abdomen, worrisome for at least partial small bowel obstruction.  Right lower quadrant renal transplant without hydronephrosis.  Status post bilateral nephrectomy.  Additional stable ancillary findings as above.   Original Report Authenticated By: Julian Hy, M.D.    Dg Abd Acute W/chest  04/23/2012  *RADIOLOGY REPORT*  Clinical Data: Abdominal pain, vomiting  ACUTE ABDOMEN SERIES (ABDOMEN 2 VIEW & CHEST 1 VIEW)  Comparison: Chest radiographs dated 04/10/2012.  CT abdomen pelvis dated 12/11/2010.  Findings: Chronic interstitial markings/bronchitic changes.  No focal consolidation. No pleural effusion or pneumothorax.  Cardiomediastinal silhouette is within normal limits.  Nonspecific bowel gas pattern without disproportionate small bowel dilatation to suggest small bowel obstruction. No evidence of free air under the diaphragm on the upright view.  Surgical clips in the bilateral abdomen.  IVC filter at L3-4.  IMPRESSION: No evidence of acute cardiopulmonary disease.  No evidence of small bowel obstruction or free air.   Original Report Authenticated By: Julian Hy, M.D.      1. Partial small bowel obstruction   2. Abdominal pain   3. Renal insufficiency     12:03 AM Pt's pain improved, then recurred.  Pt not tolerating all of PO contrast so CT performed under suboptimal conditions, also due to transplant kidney and elevated Cr of 1.7, chose oral contrast only.  Partial SBO noted mostly in RLQ where pt's pain is mostly.  Will try to  insert NGT and place to LIWS.  Will transfer to [facility] considering pt is not tolerating PO's, will need to see if inntestinal obstruction improves with conservative treatment and consults to surgery and nephrology can occur as needed.     12:13 AM I spoke to Dr. Hal Hope who agrees to accept pt to [facility], team 10, med/surg floor.  I discussed treatment with pt, she understands that NGT is recommended and that without, unlikely that she will decompress and that if she doesn't surgery might be needed.    MDM  I personally performed the services described in this documentation, which was scribed in my presence. The recorded information has been reviewed and is accurate.    Pt with diffuse abd pain, bloating, persistent N/V and decreased flatus.  I suspect bowel obstruction.  However, plain films initially per radiologist is non specific.  Will get CT of abd/pelvis.  Likely will need admit due to pain, persistent vomiting.      Saddie Benders. Dorna Mai, MD 04/24/12 Ofilia Neas  Saddie Benders. Dorna Mai, MD 04/24/12 4503  Saddie Benders. Adrie Picking, MD 05/16/12 [phone]

## 2012-04-23 NOTE — ED Notes (Signed)
Pt. Has multiple old fistula sites from past med history.  Pt. Has history of dialysis in past with no working fistulas now due to she has had a kidney transplant and reports doing well since that.

## 2012-04-23 NOTE — ED Notes (Signed)
Family at bedside. 

## 2012-04-23 NOTE — ED Notes (Signed)
Radiology notified of need for ct scan without contrast, pt to attempt to drink oral constrat if able to tolerate

## 2012-04-24 ENCOUNTER — Inpatient Hospital Stay (HOSPITAL_COMMUNITY): Payer: Medicare Other

## 2012-04-24 DIAGNOSIS — N183 Chronic kidney disease, stage 3 unspecified: Secondary | ICD-10-CM | POA: Diagnosis not present

## 2012-04-24 DIAGNOSIS — I10 Essential (primary) hypertension: Secondary | ICD-10-CM | POA: Diagnosis not present

## 2012-04-24 DIAGNOSIS — E871 Hypo-osmolality and hyponatremia: Secondary | ICD-10-CM | POA: Diagnosis not present

## 2012-04-24 DIAGNOSIS — Z940 Kidney transplant status: Secondary | ICD-10-CM | POA: Diagnosis not present

## 2012-04-24 DIAGNOSIS — Z85528 Personal history of other malignant neoplasm of kidney: Secondary | ICD-10-CM | POA: Diagnosis not present

## 2012-04-24 DIAGNOSIS — Z7901 Long term (current) use of anticoagulants: Secondary | ICD-10-CM | POA: Diagnosis not present

## 2012-04-24 DIAGNOSIS — Z79899 Other long term (current) drug therapy: Secondary | ICD-10-CM | POA: Diagnosis not present

## 2012-04-24 DIAGNOSIS — I4891 Unspecified atrial fibrillation: Secondary | ICD-10-CM | POA: Diagnosis not present

## 2012-04-24 DIAGNOSIS — K5090 Crohn's disease, unspecified, without complications: Secondary | ICD-10-CM | POA: Diagnosis present

## 2012-04-24 DIAGNOSIS — D6489 Other specified anemias: Secondary | ICD-10-CM | POA: Diagnosis present

## 2012-04-24 DIAGNOSIS — R05 Cough: Secondary | ICD-10-CM | POA: Diagnosis not present

## 2012-04-24 DIAGNOSIS — Z880 Allergy status to penicillin: Secondary | ICD-10-CM | POA: Diagnosis not present

## 2012-04-24 DIAGNOSIS — D72829 Elevated white blood cell count, unspecified: Secondary | ICD-10-CM | POA: Diagnosis present

## 2012-04-24 DIAGNOSIS — K56609 Unspecified intestinal obstruction, unspecified as to partial versus complete obstruction: Secondary | ICD-10-CM | POA: Diagnosis not present

## 2012-04-24 DIAGNOSIS — Z888 Allergy status to other drugs, medicaments and biological substances status: Secondary | ICD-10-CM | POA: Diagnosis not present

## 2012-04-24 DIAGNOSIS — Z86718 Personal history of other venous thrombosis and embolism: Secondary | ICD-10-CM | POA: Diagnosis not present

## 2012-04-24 DIAGNOSIS — IMO0002 Reserved for concepts with insufficient information to code with codable children: Secondary | ICD-10-CM | POA: Diagnosis not present

## 2012-04-24 DIAGNOSIS — Z882 Allergy status to sulfonamides status: Secondary | ICD-10-CM | POA: Diagnosis not present

## 2012-04-24 DIAGNOSIS — G4733 Obstructive sleep apnea (adult) (pediatric): Secondary | ICD-10-CM | POA: Diagnosis present

## 2012-04-24 DIAGNOSIS — I129 Hypertensive chronic kidney disease with stage 1 through stage 4 chronic kidney disease, or unspecified chronic kidney disease: Secondary | ICD-10-CM | POA: Diagnosis not present

## 2012-04-24 DIAGNOSIS — K56600 Partial intestinal obstruction, unspecified as to cause: Secondary | ICD-10-CM | POA: Diagnosis present

## 2012-04-24 DIAGNOSIS — N289 Disorder of kidney and ureter, unspecified: Secondary | ICD-10-CM | POA: Diagnosis not present

## 2012-04-24 DIAGNOSIS — K219 Gastro-esophageal reflux disease without esophagitis: Secondary | ICD-10-CM | POA: Diagnosis present

## 2012-04-24 DIAGNOSIS — M329 Systemic lupus erythematosus, unspecified: Secondary | ICD-10-CM | POA: Diagnosis present

## 2012-04-24 DIAGNOSIS — R109 Unspecified abdominal pain: Secondary | ICD-10-CM | POA: Diagnosis not present

## 2012-04-24 DIAGNOSIS — E039 Hypothyroidism, unspecified: Secondary | ICD-10-CM | POA: Diagnosis present

## 2012-04-24 DIAGNOSIS — Z881 Allergy status to other antibiotic agents status: Secondary | ICD-10-CM | POA: Diagnosis not present

## 2012-04-24 LAB — BASIC METABOLIC PANEL
BUN: 24 mg/dL — ABNORMAL HIGH (ref 6–23)
CO2: 23 mEq/L (ref 19–32)
Calcium: 9 mg/dL (ref 8.4–10.5)
Creatinine, Ser: 1.51 mg/dL — ABNORMAL HIGH (ref 0.50–1.10)
Glucose, Bld: 107 mg/dL — ABNORMAL HIGH (ref 70–99)

## 2012-04-24 LAB — CBC
MCH: 28.8 pg (ref 26.0–34.0)
MCHC: 31.1 g/dL (ref 30.0–36.0)
MCV: 92.7 fL (ref 78.0–100.0)
Platelets: 167 10*3/uL (ref 150–400)
RDW: 15.7 % — ABNORMAL HIGH (ref 11.5–15.5)

## 2012-04-24 MED ORDER — TACROLIMUS 1 MG PO CAPS
6.0000 mg | ORAL_CAPSULE | Freq: Two times a day (BID) | ORAL | Status: DC
  Administered 2012-04-24 (×2): 6 mg via ORAL
  Filled 2012-04-24 (×4): qty 6

## 2012-04-24 MED ORDER — HYDROMORPHONE HCL PF 1 MG/ML IJ SOLN
0.5000 mg | Freq: Once | INTRAMUSCULAR | Status: AC
  Administered 2012-04-24: 0.5 mg via INTRAVENOUS
  Filled 2012-04-24: qty 1

## 2012-04-24 MED ORDER — PREDNISONE 5 MG PO TABS
5.0000 mg | ORAL_TABLET | Freq: Every day | ORAL | Status: DC
  Administered 2012-04-24: 5 mg via ORAL
  Filled 2012-04-24 (×3): qty 1

## 2012-04-24 MED ORDER — AMIODARONE HCL 100 MG PO TABS
100.0000 mg | ORAL_TABLET | Freq: Two times a day (BID) | ORAL | Status: DC
  Administered 2012-04-24 – 2012-04-28 (×8): 100 mg via ORAL
  Filled 2012-04-24 (×10): qty 1

## 2012-04-24 MED ORDER — MYCOPHENOLATE SODIUM 180 MG PO TBEC
360.0000 mg | DELAYED_RELEASE_TABLET | Freq: Two times a day (BID) | ORAL | Status: DC
  Administered 2012-04-24 (×2): 360 mg via ORAL
  Filled 2012-04-24 (×4): qty 2

## 2012-04-24 MED ORDER — ONDANSETRON HCL 4 MG/2ML IJ SOLN
4.0000 mg | Freq: Four times a day (QID) | INTRAMUSCULAR | Status: DC | PRN
  Administered 2012-04-24 (×2): 4 mg via INTRAVENOUS
  Filled 2012-04-24 (×2): qty 2

## 2012-04-24 MED ORDER — METOPROLOL TARTRATE 100 MG PO TABS
100.0000 mg | ORAL_TABLET | Freq: Two times a day (BID) | ORAL | Status: DC
  Administered 2012-04-24 – 2012-04-28 (×5): 100 mg via ORAL
  Filled 2012-04-24 (×10): qty 1

## 2012-04-24 MED ORDER — ONDANSETRON HCL 4 MG/2ML IJ SOLN
4.0000 mg | Freq: Once | INTRAMUSCULAR | Status: AC
  Administered 2012-04-24: 4 mg via INTRAVENOUS

## 2012-04-24 MED ORDER — INSULIN ASPART 100 UNIT/ML ~~LOC~~ SOLN: 0.0000 [IU] | SUBCUTANEOUS | Status: DC

## 2012-04-24 MED ORDER — HYDROMORPHONE HCL PF 1 MG/ML IJ SOLN: 1.0000 mg | Freq: Once | INTRAMUSCULAR | Status: DC | PRN

## 2012-04-24 MED ORDER — LORAZEPAM 0.5 MG PO TABS
0.5000 mg | ORAL_TABLET | Freq: Once | ORAL | Status: AC
  Administered 2012-04-25: 0.5 mg via ORAL
  Filled 2012-04-24: qty 1

## 2012-04-24 MED ORDER — HYDROMORPHONE HCL PF 1 MG/ML IJ SOLN
1.0000 mg | INTRAMUSCULAR | Status: DC | PRN
  Administered 2012-04-25 – 2012-04-27 (×7): 1 mg via INTRAVENOUS
  Filled 2012-04-24 (×7): qty 1

## 2012-04-24 MED ORDER — ONDANSETRON HCL 4 MG PO TABS: 4.0000 mg | ORAL_TABLET | Freq: Four times a day (QID) | ORAL | Status: DC | PRN

## 2012-04-24 MED ORDER — TACROLIMUS 1 MG PO CAPS: 1.0000 mg | ORAL_CAPSULE | Freq: Two times a day (BID) | ORAL | Status: DC

## 2012-04-24 MED ORDER — ONDANSETRON HCL 4 MG/2ML IJ SOLN
INTRAMUSCULAR | Status: AC
  Filled 2012-04-24: qty 2

## 2012-04-24 MED ORDER — NIFEDIPINE ER 60 MG PO TB24
60.0000 mg | ORAL_TABLET | Freq: Every day | ORAL | Status: DC
  Administered 2012-04-24 – 2012-04-26 (×2): 60 mg via ORAL
  Filled 2012-04-24 (×5): qty 1

## 2012-04-24 MED ORDER — TACROLIMUS 1 MG PO CAPS: 5.0000 mg | ORAL_CAPSULE | Freq: Two times a day (BID) | ORAL | Status: DC

## 2012-04-24 MED ORDER — SODIUM CHLORIDE 0.9 % IV SOLN
INTRAVENOUS | Status: AC
  Administered 2012-04-24: 05:00:00 via INTRAVENOUS

## 2012-04-24 NOTE — Consult Note (Signed)
Patient interviewed and examined, agree with PA note above. Imaging also personally reviewed.  Her abdomen is mildly distended, soft and non tender.  Has apparent SBO secondary to adhesions.  Agree with non operative management for now Edward Jolly MD, [facility]  04/24/2012 12:33 PM

## 2012-04-24 NOTE — Progress Notes (Signed)
[facility]                                                                                Patient Demographics  [name], is a 71 y.o. female  [name]:[id number]  MHD:[id number]  DOB - 30-Sep-1940  Admit date - 04/23/2012  Admitting Physician [name], MD  Outpatient Primary MD for the patient is DETERDING,[name], MD  LOS - 1   Chief Complaint  Patient presents with  . Abdominal Pain        Assessment & Plan    1. Partial small bowel obstruction. A patient with Crohn's disease, multiple GI surgeries in the past including renal transplant. Patient has NG tube, is currently pain-free and nausea free, NG tube has minimal return, she has hypoactive bowel sounds, however is not passing flatus yet, will repeat 2 view abdominal x-ray, general surgery to evaluate the patient. Called Gen. surgery Dr. Lesle Chris.   2. History of renal transplant. We'll continue transplant medications by mouth including prednisone and Tacromilus, NG tube will be clamped for 90 minutes after medication administration, will monitor BMP closely.   3. History of atrial fibrillation. Told will be rate controlled, home dose amiodarone along with beta blocker will be continued, she is already on Procardia which will be continued, Coumadin will be held till her GI issues resolve.  Lab Results  Component Value Date   INR 1.39 04/24/2012   INR 1.33 04/23/2012   INR 1.20 12/21/2011    4. History of Crohn's disease. Supportive care for now patient GI followup.    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 Creatinine better or at baseline which is around 1.5. Gentle IV fluids for hydration.   6. Mild leukocytosis likely reactionary from #1 above, no signs of infection she is afebrile, will monitor urine cultures closely.     Code Status: Full  Family Communication: Discussed with the patient  Disposition Plan: Home   Procedures NG tube placement   Consults   surgery   DVT Prophylaxis  SCDs  Lab Results  Component Value Date   PLT 167 04/24/2012    Medications  Scheduled Meds:   . amiodarone  100 mg Oral BID  . metoprolol  100 mg Oral BID  . mycophenolate  360 mg Oral BID  . NIFEdipine  60 mg Oral Daily  . predniSONE  5 mg Oral Q breakfast  . tacrolimus  6 mg Oral BID   Continuous Infusions:   . sodium chloride 75 mL/hr at 04/24/12 0437   PRN Meds:.HYDROmorphone (DILAUDID) injection, ondansetron (ZOFRAN) IV, ondansetron  Antibiotics    Anti-infectives    None       Time Spent in minutes   35   SINGH,PRASHANT K M.D on 04/24/2012 at 8:36 AM  Between 7am to 7pm - Pager - [phone]  After 7pm go to [email or web address] - password [email or web address]  And look for the night coverage person covering for me after hours  [facility] Office  [phone]    Subjective:   [name] today has, No headache, No chest pain, No abdominal pain - No Nausea, No new weakness tingling or numbness, No Cough - SOB.  Still not passing flatus  Objective:   Filed Vitals:   04/23/12 2203 04/24/12 0031 04/24/12 0213 04/24/12 0542  BP: 165/80 160/80 146/70 152/76  Pulse: 80 89 76 70  Temp: 97.7 F (36.5 C) 98.6 F (37 C) 98.2 F (36.8 C) 98.3 F (36.8 C)  TempSrc: Oral     Resp: 18 16 18 18   Height:      Weight:      SpO2: 100% 100% 95% 100%    Wt Readings from Last 3 Encounters:  04/23/12 62.596 kg (138 lb)  12/20/11 60.419 kg (133 lb 3.2 oz)     Intake/Output Summary (Last 24 hours) at 04/24/12 0836 Last data filed at 04/24/12 0700  Gross per 24 hour  Intake      0 ml  Output     50 ml  Net    -50 ml    Exam Awake Alert, Oriented X 3, No new F.N deficits, Normal affect [place].AT,PERRAL Supple Neck,No JVD, No cervical lymphadenopathy appriciated.  Symmetrical Chest wall movement, Good air movement bilaterally, CTAB RRR,No Gallops,Rubs or new Murmurs, No Parasternal Heave Hypoactive B.Sounds, Abd Soft, Non tender,  No organomegaly appriciated, No rebound - guarding or rigidity. NG in place No Cyanosis, Clubbing or edema, No new Rash or bruise      Data Review   Micro Results No results found for this or any previous visit (from the past 240 hour(s)).  Radiology Reports Ct Abdomen Pelvis Wo Contrast  04/23/2012  *RADIOLOGY REPORT*  Clinical Data: Abdominopelvic pain, nausea/vomiting, history of renal transplant  CT ABDOMEN AND PELVIS WITHOUT CONTRAST  Technique:  Multidetector CT imaging of the abdomen and pelvis was performed following the standard protocol without intravenous contrast.  Comparison: 12/11/2010  Findings: Linear scarring at the lung bases with calcifications along the right hemidiaphragm.  Coronary atherosclerosis.  Stable 1.5 x 1.8 cm right retrocrural fluid density lesion (series 2/image 8), likely benign.  Stable probable cyst in the posterior segment right hepatic lobe (series 2/image 15).  Calcified splenic granulomata with stable faint capsular calcification.  Pancreas is atrophic.  Bilateral adrenal glands are unremarkable.  Status post cholecystectomy.  No intrahepatic or extrahepatic ductal dilatation.  Status post bilateral nephrectomy.  Right lower quadrant renal transplant without hydronephrosis.  Multiple dilated loops of small bowel in the right mid/lower abdomen, measuring up to 5.0 cm (series 2/image 39).  The terminal ileum is decompressed (series 2/image 47). Colon is relatively decompressed.  While no definite focal transition is visualized, the appearance is worrisome for at least partial small bowel obstruction.  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  Atherosclerotic calcifications of the abdominal aorta and branch vessels.  No abdominopelvic ascites.  No suspicious abdominopelvic lymphadenopathy.  Uterus is unremarkable.  No adnexal masses.  Bladder is within normal limits.  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  No focal osseous lesions.   IMPRESSION: Multiple dilated loops of small bowel in the right mid/lower abdomen, worrisome for at least partial small bowel obstruction.  Right lower quadrant renal transplant without hydronephrosis.  Status post bilateral nephrectomy.  Additional stable ancillary findings as above.   Original Report Authenticated By: Julian Hy, M.D.    Dg Chest 2 View  04/10/2012  *RADIOLOGY REPORT*  Clinical Data: Cough, congestion  CHEST - 2 VIEW  Comparison: Portable chest x-ray of 12/15/2010  Findings: No active infiltrate or effusion is seen.  Calcified pleural plaque at the right lung base is stable.  Mild cardiomegaly is stable.  No acute  bony abnormality is seen with degenerative change in the mid thoracic spine again noted.  IMPRESSION: Stable chest x-ray.  Mild cardiomegaly.  No active lung disease.   Original Report Authenticated By: Ivar Drape, M.D.    Dg Abd Acute W/chest  04/23/2012  *RADIOLOGY REPORT*  Clinical Data: Abdominal pain, vomiting  ACUTE ABDOMEN SERIES (ABDOMEN 2 VIEW & CHEST 1 VIEW)  Comparison: Chest radiographs dated 04/10/2012.  CT abdomen pelvis dated 12/11/2010.  Findings: Chronic interstitial markings/bronchitic changes.  No focal consolidation. No pleural effusion or pneumothorax.  Cardiomediastinal silhouette is within normal limits.  Nonspecific bowel gas pattern without disproportionate small bowel dilatation to suggest small bowel obstruction. No evidence of free air under the diaphragm on the upright view.  Surgical clips in the bilateral abdomen.  IVC filter at L3-4.  IMPRESSION: No evidence of acute cardiopulmonary disease.  No evidence of small bowel obstruction or free air.   Original Report Authenticated By: Julian Hy, M.D.     CBC  Lab 04/24/12 0624 04/23/12 1850 04/19/12 1350  WBC 14.6* 15.1* --  HGB 10.6* 11.4* 11.5*  HCT 34.1* 35.8* --  PLT 167 177 --  MCV 92.7 90.9 --  MCH 28.8 28.9 --  MCHC 31.1 31.8 --  RDW 15.7* 15.4 --  LYMPHSABS -- 1.0 --   MONOABS -- 1.1* --  EOSABS -- 0.0 --  BASOSABS -- 0.0 --  BANDABS -- -- --    Chemistries   Lab 04/24/12 0624 04/23/12 1850 04/19/12 1342  NA 138 135 138  K 4.5 4.3 4.2  CL 104 99 101  CO2 23 22 23   GLUCOSE 107* 140* 99  BUN 24* 28* 27*  CREATININE 1.51* 1.70* 1.65*  CALCIUM 9.0 9.9 9.7  MG -- -- --  AST -- 13 --  ALT -- 9 --  ALKPHOS -- 141* --  BILITOT -- 0.4 --   ------------------------------------------------------------------------------------------------------------------ estimated creatinine clearance is 29.5 ml/min (by C-G formula based on Cr of 1.51). ------------------------------------------------------------------------------------------------------------------ No results found for this basename: HGBA1C:2 in the last 72 hours ------------------------------------------------------------------------------------------------------------------ No results found for this basename: CHOL:2,HDL:2,LDLCALC:2,TRIG:2,CHOLHDL:2,LDLDIRECT:2 in the last 72 hours ------------------------------------------------------------------------------------------------------------------ No results found for this basename: TSH,T4TOTAL,FREET3,T3FREE,THYROIDAB in the last 72 hours ------------------------------------------------------------------------------------------------------------------ No results found for this basename: VITAMINB12:2,FOLATE:2,FERRITIN:2,TIBC:2,IRON:2,RETICCTPCT:2 in the last 72 hours  Coagulation profile  Lab 04/24/12 0624 04/23/12 1850  INR 1.39 1.33  PROTIME -- --    No results found for this basename: DDIMER:2 in the last 72 hours  Cardiac Enzymes No results found for this basename: CK:3,CKMB:3,TROPONINI:3,MYOGLOBIN:3 in the last 168 hours ------------------------------------------------------------------------------------------------------------------ No components found with this basename: POCBNP:3

## 2012-04-24 NOTE — ED Notes (Signed)
Patient refused to have ng tube placed, requesting to have ng placed at hospital

## 2012-04-24 NOTE — H&P (Signed)
PCP:   Amanda Sou, MD   Chief Complaint:  N/v abd pain  HPI: 71 yo female h/o renal transplant, ckd, osa, comes in with one day n/v nonbloody with generalized abd pain.  No flatus or bm since yesterday and feels very bloated.  Went to urgent care and found to have psbo and transferred here.  ngt has been placed and she feels less bloated and not vomiting anymore.  Has had minimal outpt from ngt.  No fevers.    Review of Systems:  O/w neg  Past Medical History: Past Medical History  Diagnosis Date  . Crohn's disease   . Hypertension   . Lupus   . Refusal of blood transfusion for reasons of conscience 12/20/2011    pt is [employer]'s [employer]  . Atrial fibrillation   . DVT of leg (deep venous thrombosis) 2012-8./13/2013    "have had one in each leg now"  . Numbness and tingling of foot     bilaterally  . GERD (gastroesophageal reflux disease)   . OSA on CPAP   . Hypothyroidism   . Anemia     "when lupus flares up"  . Lower GI bleed 2012  . Arthritis     "in my joints"  . Renal disorder     S/P nephrectomy; dialysis; "working fine now" (12/20/11)  . Renal cell carcinoma    Past Surgical History  Procedure Date  . Cesarean section 1984  . Nephrectomy transplanted organ 2006    bilaterally  . Eye surgery   . Excisional hemorrhoidectomy   . Cholecystectomy 1990's  . Cataract extraction w/ intraocular lens  implant, bilateral 2010  . Vena cava filter placement 2012  . Insertion of dialysis catheter 1988-2006    "peritoneal and hemodialysis; had multiple grafts and fistulas; last graft clotted off 2012"    Medications: Prior to Admission medications   Medication Sig Start Date End Date Taking? Authorizing Provider  acetaminophen (TYLENOL) 325 MG tablet Take 650 mg by mouth every 6 (six) hours as needed. For pain    Historical Provider, MD  amiodarone (PACERONE) 200 MG tablet Take 100 mg by mouth 2 (two) times daily.      Historical Provider, MD  calcitRIOL  (ROCALTROL) 0.25 MCG capsule Take 0.25 mcg by mouth every other day.     Historical Provider, MD  calcitRIOL (ROCALTROL) 0.25 MCG capsule Take 0.5 mcg by mouth every other day.    Historical Provider, MD  enoxaparin (LOVENOX) 150 MG/ML injection Inject 0.4 mLs (60 mg total) into the skin every 12 (twelve) hours. 12/21/11   Clanford Marisa Hua, MD  enoxaparin (LOVENOX) KIT 1 kit by Does not apply route once. 12/21/11   Clanford Marisa Hua, MD  epoetin alfa (EPOGEN,PROCRIT) 42876 UNIT/ML injection Inject 40,000 Units into the skin every 30 (thirty) days.    Historical Provider, MD  gabapentin (NEURONTIN) 100 MG capsule Take 200 mg by mouth at bedtime.     Historical Provider, MD  levothyroxine (SYNTHROID, LEVOTHROID) 100 MCG tablet Take 100 mcg by mouth daily.    Historical Provider, MD  metoprolol (LOPRESSOR) 100 MG tablet Take 100 mg by mouth 2 (two) times daily.    Historical Provider, MD  mycophenolate (MYFORTIC) 180 MG EC tablet Take 360 mg by mouth 2 (two) times daily.      Historical Provider, MD  NIFEdipine (PROCARDIA XL/ADALAT-CC) 60 MG 24 hr tablet Take 60 mg by mouth daily.      Historical Provider, MD  omeprazole (PRILOSEC) 20  MG capsule Take 20 mg by mouth daily.     Historical Provider, MD  polyethylene glycol (MIRALAX / GLYCOLAX) packet Take 17 g by mouth 2 (two) times daily as needed. For constipation    Historical Provider, MD  predniSONE (DELTASONE) 5 MG tablet Take 5 mg by mouth daily.     Historical Provider, MD  tacrolimus (PROGRAF) 1 MG capsule Take 1 mg by mouth 2 (two) times daily. With 48m to =637m   Historical Provider, MD  tacrolimus (PROGRAF) 5 MG capsule Take 5 mg by mouth 2 (two) times daily. With 81m5mo =6mg35m Historical Provider, MD  warfarin (COUMADIN) 5 MG tablet Take 1 tablet (5 mg total) by mouth daily. 12/21/11 12/20/12  [place]    Allergies:   Allergies  Allergen Reactions  . Amoxicillin Anaphylaxis  . Ampicillin Anaphylaxis  . Ciprofloxacin  Swelling    "of my throat"  . Erythromycin Anaphylaxis  . Penicillins Anaphylaxis  . Sulfa Antibiotics Itching  . Sulfamethoxazole W-Trimethoprim Itching  . Tetracycline Anaphylaxis  . Labetalol Nausea And Vomiting    Social History:  reports that she has never smoked. She has never used smokeless tobacco. She reports that she does not drink alcohol or use illicit drugs.  Family History: History reviewed. No pertinent family history.  Physical Exam: Filed Vitals:   04/23/12 2022 04/23/12 2203 04/24/12 0031 04/24/12 0213  BP: 130/59 165/80 160/80 146/70  Pulse: 71 80 89 76  Temp:  97.7 F (36.5 C) 98.6 F (37 C) 98.2 F (36.8 C)  TempSrc:  Oral    Resp: 16 18 16 18   Height:      Weight:      SpO2: 98% 100% 100% 95%   General appearance: alert, cooperative and no distress Neck: no JVD and supple, symmetrical, trachea midline Lungs: clear to auscultation bilaterally Heart: regular rate and rhythm, S1, S2 normal, no murmur, click, rub or gallop Abdomen: mildly distended pos bs, no r/g ngt in place with bilious return nonacute abd Extremities: extremities normal, atraumatic, no cyanosis or edema Pulses: 2+ and symmetric Skin: Skin color, texture, turgor normal. No rashes or lesions Neurologic: Grossly normal    Labs on Admission:   [facility]  NA 135  K 4.3  CL 99  CO2 22  GLUCOSE 140*  BUN 28*  CREATININE 1.70*  CALCIUM 9.9  MG --  PHOS --    Basename 04/23/12 1850  AST 13  ALT 9  ALKPHOS 141*  BILITOT 0.4  PROT 7.6  ALBUMIN 4.2    Basename 04/23/12 1850  LIPASE 19  AMYLASE --    Basename 04/23/12 1850  WBC 15.1*  NEUTROABS 13.0*  HGB 11.4*  HCT 35.8*  MCV 90.9  PLT 177    Radiological Exams on Admission: Ct Abdomen Pelvis Wo Contrast  04/23/2012  *RADIOLOGY REPORT*  Clinical Data: Abdominopelvic pain, nausea/vomiting, history of renal transplant  CT ABDOMEN AND PELVIS WITHOUT CONTRAST  Technique:  Multidetector CT imaging of  the abdomen and pelvis was performed following the standard protocol without intravenous contrast.  Comparison: 12/11/2010  Findings: Linear scarring at the lung bases with calcifications along the right hemidiaphragm.  Coronary atherosclerosis.  Stable 1.5 x 1.8 cm right retrocrural fluid density lesion (series 2/image 8), likely benign.  Stable probable cyst in the posterior segment right hepatic lobe (series 2/image 15).  Calcified splenic granulomata with stable faint capsular calcification.  Pancreas is atrophic.  Bilateral adrenal glands are  unremarkable.  Status post cholecystectomy.  No intrahepatic or extrahepatic ductal dilatation.  Status post bilateral nephrectomy.  Right lower quadrant renal transplant without hydronephrosis.  Multiple dilated loops of small bowel in the right mid/lower abdomen, measuring up to 5.0 cm (series 2/image 39).  The terminal ileum is decompressed (series 2/image 47). Colon is relatively decompressed.  While no definite focal transition is visualized, the appearance is worrisome for at least partial small bowel obstruction.  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  Atherosclerotic calcifications of the abdominal aorta and branch vessels.  No abdominopelvic ascites.  No suspicious abdominopelvic lymphadenopathy.  Uterus is unremarkable.  No adnexal masses.  Bladder is within normal limits.  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  No focal osseous lesions.  IMPRESSION: Multiple dilated loops of small bowel in the right mid/lower abdomen, worrisome for at least partial small bowel obstruction.  Right lower quadrant renal transplant without hydronephrosis.  Status post bilateral nephrectomy.  Additional stable ancillary findings as above.   Original Report Authenticated By: Julian Hy, M.D.    Dg Abd Acute W/chest  04/23/2012  *RADIOLOGY REPORT*  Clinical Data: Abdominal pain, vomiting  ACUTE ABDOMEN SERIES (ABDOMEN 2 VIEW & CHEST 1 VIEW)   Comparison: Chest radiographs dated 04/10/2012.  CT abdomen pelvis dated 12/11/2010.  Findings: Chronic interstitial markings/bronchitic changes.  No focal consolidation. No pleural effusion or pneumothorax.  Cardiomediastinal silhouette is within normal limits.  Nonspecific bowel gas pattern without disproportionate small bowel dilatation to suggest small bowel obstruction. No evidence of free air under the diaphragm on the upright view.  Surgical clips in the bilateral abdomen.  IVC filter at L3-4.  IMPRESSION: No evidence of acute cardiopulmonary disease.  No evidence of small bowel obstruction or free air.   Original Report Authenticated By: Julian Hy, M.D.     Assessment/Plan  71 yo female with psbo Principal Problem:  *Small bowel obstruction, partial Active Problems:  DVT of leg (deep venous thrombosis)  Atrial fibrillation  CKD (chronic kidney disease), stage III  History of renal transplant  HTN (hypertension)  Crohn's disease  Ivf. Npo x meds.  ngt for bowel decompression.  Conservative management.  Hold most meds xcept her transplant meds.  Will need to consult gen surgery tomorrow.  Hold anticoagulants for now until seen by surgery.  Add on lactic acid level.  DAVID,RACHAL A 04/24/2012, 3:01 AM

## 2012-04-24 NOTE — Progress Notes (Signed)
UR COMPLETED  

## 2012-04-24 NOTE — Consult Note (Signed)
Reason for Consult:SBO Referring Physician: [name] is an 71 y.o. female.  HPI: [name] is a 71 year old female with a complicated medical history. [name] developed a renal cell carcinoma and underwent bilateral nephrectomies in the 1990s. 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 [name] is status post cholecystectomy, and prior C-section. [name] is  chronic suppressants for her transplant, along with prednisone. On 04/22/12, he was doing well until lunchtime, at that time [name] felt bloated and was unable to eat. [name] took him home remaining bloated and developed some nausea. [name] was unable to the rest of the day [name] did have one bowel movement on Sunday, 04/22/12, and this was reported normal. Was on Monday 04/23/12, [name] continued to be bloated, [name] had ongoing abdominal distention, [name] had nausea, followed by vomiting. Distention changed into just some chronic abdominal pain which is diffuse. [name] presented to the emergency room at [facility] and was admitted at 3 AM this morning. Films on admission showed no cardiopulmonary disease. Nonspecific bowel pattern to suggest some small bowel dilatation. Multiple clips and an IVC filter. CT scan was then obtained which shows an atrophic pancreas, adrenal glands are unremarkable, status post cholecystectomy status post bilateral nephrectomies the right lower quadrant transplant without hydronephrosis. There are multiple dilated loops of small bowel in the mid right lower abdomen the terminal ileum was decompressed the colon is decompressed no transition point find. Right lateral abdominal wall/incisional hernias present.  After admission NG tube was placed, it is currently clamped medications. The patient's abdominal pain is better, [name] has some mild distention, no further nausea or vomiting. So far only 50 mL recorded from the NG.  Past Medical History  Diagnosis Date  . Crohn's disease   . Hypertension   . Lupus   .  Refusal of blood transfusion for reasons of conscience 12/20/2011    pt is Jehovah's Witness  . Atrial fibrillation (PAF)   . DVT of leg (deep venous thrombosis) 2012-8./13/2013    "have had one in each leg now"  . Numbness and tingling of foot     bilaterally  . GERD (gastroesophageal reflux disease)   . OSA on CPAP   . Hypothyroidism   . Anemia  "when lupus flares up"       . Lower GI bleed 12/2011 2012  . Arthritis   "in my joints"        Renal cell carcinoma with bilateral nephrectomies 1990's    renal transplant 2006, previously on hemodialysis 1990s - 2006 after transplant.        IVC filter placed 2010    Past Surgical History  Procedure Date  . Cesarean section 1984  . Nephrectomy transplanted organ 2006    bilaterally  . Eye surgery   . Excisional hemorrhoidectomy   . Cholecystectomy 1990's  . Cataract extraction w/ intraocular lens  implant, bilateral 2010  . Vena cava filter placement 2012  . Insertion of dialysis catheter 1988-2006    "peritoneal and hemodialysis; had multiple grafts and fistulas; last graft clotted off 2012"    History reviewed. No pertinent family history.   Social History:  reports that [name] has never smoked. [name] has never used smokeless tobacco. [name] reports that [name] does not drink alcohol or use illicit drugs. Drugs: None, ETOH: none, Tobacco: none  Allergies:  Allergies  Allergen Reactions  . Amoxicillin Anaphylaxis  . Ampicillin Anaphylaxis  . Ciprofloxacin Swelling    "of my throat"  .  Erythromycin Anaphylaxis  . Penicillins Anaphylaxis  . Sulfa Antibiotics Itching  . Sulfamethoxazole W-Trimethoprim Itching  . Tetracycline Anaphylaxis  . Labetalol Nausea And Vomiting    Medications:  Prior to Admission:  Prescriptions prior to admission  Medication Sig Dispense Refill  . acetaminophen (TYLENOL) 325 MG tablet Take 650 mg by mouth every 6 (six) hours as needed. For pain      . amiodarone (PACERONE) 200 MG tablet Take 100 mg  by mouth 2 (two) times daily.        . calcitRIOL (ROCALTROL) 0.25 MCG capsule Take 0.25 mcg by mouth every other day.       . calcitRIOL (ROCALTROL) 0.25 MCG capsule Take 0.5 mcg by mouth every other day.      . enoxaparin (LOVENOX) 150 MG/ML injection Inject 0.4 mLs (60 mg total) into the skin every 12 (twelve) hours.  10 Syringe  0  . enoxaparin (LOVENOX) KIT 1 kit by Does not apply route once.  1 kit  0  . epoetin alfa (EPOGEN,PROCRIT) 42683 UNIT/ML injection Inject 40,000 Units into the skin every 30 (thirty) days.      Marland Kitchen gabapentin (NEURONTIN) 100 MG capsule Take 200 mg by mouth at bedtime.       Marland Kitchen levothyroxine (SYNTHROID, LEVOTHROID) 100 MCG tablet Take 100 mcg by mouth daily.      . metoprolol (LOPRESSOR) 100 MG tablet Take 100 mg by mouth 2 (two) times daily.      . mycophenolate (MYFORTIC) 180 MG EC tablet Take 360 mg by mouth 2 (two) times daily.        Marland Kitchen NIFEdipine (PROCARDIA XL/ADALAT-CC) 60 MG 24 hr tablet Take 60 mg by mouth daily.        Marland Kitchen omeprazole (PRILOSEC) 20 MG capsule Take 20 mg by mouth daily.       . polyethylene glycol (MIRALAX / GLYCOLAX) packet Take 17 g by mouth 2 (two) times daily as needed. For constipation      . predniSONE (DELTASONE) 5 MG tablet Take 5 mg by mouth daily.       . tacrolimus (PROGRAF) 1 MG capsule Take 1 mg by mouth 2 (two) times daily. With 720m to =64m     . tacrolimus (PROGRAF) 5 MG capsule Take 5 mg by mouth 2 (two) times daily. With 20m79mo =6mg75m   . warfarin (COUMADIN) 5 MG tablet Take 1 tablet (5 mg total) by mouth daily.  20 tablet  0   Scheduled:   . amiodarone  100 mg Oral BID  . metoprolol  100 mg Oral BID  . mycophenolate  360 mg Oral BID  . NIFEdipine  60 mg Oral Daily  . predniSONE  5 mg Oral Q breakfast  . tacrolimus  6 mg Oral BID   Continuous:   . sodium chloride 75 mL/hr at 04/24/12 0437   PRN:MHD:[id number]) injection, ondansetron (ZOFRAN) IV, ondansetron Anti-infectives    None      Results for orders  placed during the hospital encounter of 04/23/12 (from the past 48 hour(s))  URINALYSIS, ROUTINE W REFLEX MICROSCOPIC     Status: Abnormal   Collection Time   04/23/12  5:57 PM      Component Value Range Comment   Color, Urine YELLOW  YELLOW    APPearance CLEAR  CLEAR    Specific Gravity, Urine 1.020  1.005 - 1.030    pH 5.0  5.0 - 8.0    Glucose, UA NEGATIVE  NEGATIVE mg/dL    Hgb urine dipstick NEGATIVE  NEGATIVE    Bilirubin Urine NEGATIVE  NEGATIVE    Ketones, ur NEGATIVE  NEGATIVE mg/dL    Protein, ur NEGATIVE  NEGATIVE mg/dL    Urobilinogen, UA 0.2  0.0 - 1.0 mg/dL    Nitrite NEGATIVE  NEGATIVE    Leukocytes, UA MODERATE (*) NEGATIVE   URINE MICROSCOPIC-ADD ON     Status: Abnormal   Collection Time   04/23/12  5:57 PM      Component Value Range Comment   Squamous Epithelial / LPF RARE  RARE    WBC, UA 7-10  <3 WBC/hpf    Bacteria, UA FEW (*) RARE   CBC WITH DIFFERENTIAL     Status: Abnormal   Collection Time   04/23/12  6:50 PM      Component Value Range Comment   WBC 15.1 (*) 4.0 - 10.5 K/uL    RBC 3.94  3.87 - 5.11 MIL/uL    Hemoglobin 11.4 (*) 12.0 - 15.0 g/dL    HCT 35.8 (*) 36.0 - 46.0 %    MCV 90.9  78.0 - 100.0 fL    MCH 28.9  26.0 - 34.0 pg    MCHC 31.8  30.0 - 36.0 g/dL    RDW 15.4  11.5 - 15.5 %    Platelets 177  150 - 400 K/uL    Neutrophils Relative 86 (*) 43 - 77 %    Neutro Abs 13.0 (*) 1.7 - 7.7 K/uL    Lymphocytes Relative 6 (*) 12 - 46 %    Lymphs Abs 1.0  0.7 - 4.0 K/uL    Monocytes Relative 7  3 - 12 %    Monocytes Absolute 1.1 (*) 0.1 - 1.0 K/uL    Eosinophils Relative 0  0 - 5 %    Eosinophils Absolute 0.0  0.0 - 0.7 K/uL    Basophils Relative 0  0 - 1 %    Basophils Absolute 0.0  0.0 - 0.1 K/uL   COMPREHENSIVE METABOLIC PANEL     Status: Abnormal   Collection Time   04/23/12  6:50 PM      Component Value Range Comment   Sodium 135  135 - 145 mEq/L    Potassium 4.3  3.5 - 5.1 mEq/L    Chloride 99  96 - 112 mEq/L    CO2 22  19 - 32 mEq/L     Glucose, Bld 140 (*) 70 - 99 mg/dL    BUN 28 (*) 6 - 23 mg/dL    Creatinine, Ser 1.70 (*) 0.50 - 1.10 mg/dL    Calcium 9.9  8.4 - 10.5 mg/dL    Total Protein 7.6  6.0 - 8.3 g/dL    Albumin 4.2  3.5 - 5.2 g/dL    AST 13  0 - 37 U/L    ALT 9  0 - 35 U/L    Alkaline Phosphatase 141 (*) 39 - 117 U/L    Total Bilirubin 0.4  0.3 - 1.2 mg/dL    GFR calc non Af Amer 30 (*) >90 mL/min    GFR calc Af Amer 35 (*) >90 mL/min   LIPASE, BLOOD     Status: Normal   Collection Time   04/23/12  6:50 PM      Component Value Range Comment   Lipase 19  11 - 59 U/L   PROTIME-INR     Status: Abnormal   Collection Time   04/23/12  6:50 PM      Component Value Range Comment   Prothrombin Time 16.2 (*) 11.6 - 15.2 seconds    INR 1.33  0.00 - 1.49   APTT     Status: Abnormal   Collection Time   04/23/12  6:50 PM      Component Value Range Comment   aPTT 42 (*) 24 - 37 seconds   LACTIC ACID, PLASMA     Status: Normal   Collection Time   04/24/12  6:20 AM      Component Value Range Comment   Lactic Acid, Venous 0.7  0.5 - 2.2 mmol/L   BASIC METABOLIC PANEL     Status: Abnormal   Collection Time   04/24/12  6:24 AM      Component Value Range Comment   Sodium 138  135 - 145 mEq/L    Potassium 4.5  3.5 - 5.1 mEq/L    Chloride 104  96 - 112 mEq/L    CO2 23  19 - 32 mEq/L    Glucose, Bld 107 (*) 70 - 99 mg/dL    BUN 24 (*) 6 - 23 mg/dL    Creatinine, Ser 1.51 (*) 0.50 - 1.10 mg/dL    Calcium 9.0  8.4 - 10.5 mg/dL    GFR calc non Af Amer 34 (*) >90 mL/min    GFR calc Af Amer 40 (*) >90 mL/min   CBC     Status: Abnormal   Collection Time   04/24/12  6:24 AM      Component Value Range Comment   WBC 14.6 (*) 4.0 - 10.5 K/uL    RBC 3.68 (*) 3.87 - 5.11 MIL/uL    Hemoglobin 10.6 (*) 12.0 - 15.0 g/dL    HCT 34.1 (*) 36.0 - 46.0 %    MCV 92.7  78.0 - 100.0 fL    MCH 28.8  26.0 - 34.0 pg    MCHC 31.1  30.0 - 36.0 g/dL    RDW 15.7 (*) 11.5 - 15.5 %    Platelets 167  150 - 400 K/uL   PROTIME-INR      Status: Abnormal   Collection Time   04/24/12  6:24 AM      Component Value Range Comment   Prothrombin Time 16.7 (*) 11.6 - 15.2 seconds    INR 1.39  0.00 - 1.49     Ct Abdomen Pelvis Wo Contrast  04/23/2012  *RADIOLOGY REPORT*  Clinical Data: Abdominopelvic pain, nausea/vomiting, history of renal transplant  CT ABDOMEN AND PELVIS WITHOUT CONTRAST  Technique:  Multidetector CT imaging of the abdomen and pelvis was performed following the standard protocol without intravenous contrast.  Comparison: 12/11/2010  Findings: Linear scarring at the lung bases with calcifications along the right hemidiaphragm.  Coronary atherosclerosis.  Stable 1.5 x 1.8 cm right retrocrural fluid density lesion (series 2/image 8), likely benign.  Stable probable cyst in the posterior segment right hepatic lobe (series 2/image 15).  Calcified splenic granulomata with stable faint capsular calcification.  Pancreas is atrophic.  Bilateral adrenal glands are unremarkable.  Status post cholecystectomy.  No intrahepatic or extrahepatic ductal dilatation.  Status post bilateral nephrectomy.  Right lower quadrant renal transplant without hydronephrosis.  Multiple dilated loops of small bowel in the right mid/lower abdomen, measuring up to 5.0 cm (series 2/image 39).  The terminal ileum is decompressed (series 2/image 47). Colon is relatively decompressed.  While no definite focal transition is visualized, the appearance is worrisome for at least partial  small bowel obstruction.  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  Atherosclerotic calcifications of the abdominal aorta and branch vessels.  No abdominopelvic ascites.  No suspicious abdominopelvic lymphadenopathy.  Uterus is unremarkable.  No adnexal masses.  Bladder is within normal limits.  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  No focal osseous lesions.  IMPRESSION: Multiple dilated loops of small bowel in the right mid/lower abdomen, worrisome for at  least partial small bowel obstruction.  Right lower quadrant renal transplant without hydronephrosis.  Status post bilateral nephrectomy.  Additional stable ancillary findings as above.   Original Report Authenticated By: Julian Hy, M.D.    Dg Abd 2 Views  04/24/2012  *RADIOLOGY REPORT*  Clinical Data: Small bowel obstruction.  ABDOMEN - 2 VIEW  Comparison: CT abdomen and pelvis 04/23/2012.  Findings: NG tube is in place.  Multiple surgical clips and an IVC filter are again seen.  Short air fluid levels in small bowel persist.  Contrast material from the patient's CT scan are seen in distal small bowel loops.  Small bowel loops are distended up to 5.1 cm, not markedly changed.  IMPRESSION: No marked change in small bowel obstruction.   Original Report Authenticated By: Orlean Patten, M.D.    Dg Abd Acute W/chest  04/23/2012  *RADIOLOGY REPORT*  Clinical Data: Abdominal pain, vomiting  ACUTE ABDOMEN SERIES (ABDOMEN 2 VIEW & CHEST 1 VIEW)  Comparison: Chest radiographs dated 04/10/2012.  CT abdomen pelvis dated 12/11/2010.  Findings: Chronic interstitial markings/bronchitic changes.  No focal consolidation. No pleural effusion or pneumothorax.  Cardiomediastinal silhouette is within normal limits.  Nonspecific bowel gas pattern without disproportionate small bowel dilatation to suggest small bowel obstruction. No evidence of free air under the diaphragm on the upright view.  Surgical clips in the bilateral abdomen.  IVC filter at L3-4.  IMPRESSION: No evidence of acute cardiopulmonary disease.  No evidence of small bowel obstruction or free air.   Original Report Authenticated By: Julian Hy, M.D.     Review of Systems  Constitutional: Negative for fever, chills, weight loss, malaise/fatigue and diaphoresis.  HENT: Positive for congestion.        Being treated for URI/sinus infection on admit.  Eyes: Negative.   Respiratory: Negative.        [name] has some trouble with breathing at night  due to her sinus issues  Cardiovascular: Positive for leg swelling (occasional).  Gastrointestinal: Positive for heartburn, nausea, vomiting, abdominal pain and constipation (occasional ).       Abdominal distension started Sunday 04/22/12 Nausea late 04/22/12 Vomiting on 04/23/12  Genitourinary: Negative.   Musculoskeletal: Negative.   Skin: Negative.   Neurological: Negative for weakness.       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   Psychiatric/Behavioral: Negative.    Blood pressure 152/76, pulse 70, temperature 98.3 F (36.8 C), temperature source Oral, resp. rate 18, height 5' 3"  (1.6 m), weight 138 lb (62.596 kg), SpO2 100.00%. Physical Exam  Constitutional: [name] is oriented to person, place, and time. [name] appears well-developed and well-nourished. No distress.  HENT:  Head: Normocephalic and atraumatic.       Runny nose and congested NG in place also, not helping  Eyes: Conjunctivae normal and EOM are normal. Pupils are equal, round, and reactive to light. Right eye exhibits no discharge. Left eye exhibits no discharge. No scleral icterus.  Neck: Normal range of motion. Neck supple. No JVD present. No tracheal deviation present. No thyromegaly present.  Cardiovascular: Normal rate, regular  rhythm, normal heart sounds and intact distal pulses.  Exam reveals no gallop.   No murmur heard. Respiratory: Effort normal and breath sounds normal. No stridor. No respiratory distress. [name] has no wheezes. [name] has no rales. [name] exhibits no tenderness.  GI: Soft. [name] exhibits distension (mild distension). [name] exhibits no mass. There is tenderness (pain yesterday, none today.). There is no rebound and no guarding.       [name] has 2 major abdominal scars, with a Ventral hernia in the RLQ. Bowel sounds are hyperactive.  Musculoskeletal: [name] exhibits no edema and no tenderness.  Lymphadenopathy:    [name] has no cervical adenopathy.  Neurological: [name] is alert and  oriented to person, place, and time. No cranial nerve deficit.  Skin: Skin is warm and dry. No rash noted. [name] is not diaphoretic. No erythema. No pallor.       Old Acess for HD LUE and Left thigh. Both are occluded.  Psychiatric: [name] has a normal mood and affect. Her behavior is normal. Judgment and thought content normal.    Assessment/Plan: 1. SBO, status post bilateral nephrectomies, renal transplant, cholecystectomy, C-section. 2. History of Crohn's disease; no medications since transplant 2006. 3. History of SLE 4. Jehovah witness/no blood transfusions. 5. History of PAF, on Coumadin/amiodarone/beta blocker 6. A renal cell carcinoma with bilateral nephrectomies, on hemodialysis from 1990s till transplant 2006.  Renal transplant, on chronic immunosuppressants and prednisone . 7. GERD 8. Obstructive sleep apnea with CPAP 9. Chronic anemia 10. Hypothyroid on supplement. 11. History of DVT on Coumadin  Plan: Repeat film today shows increase in size of small bowel. Recommend complete bowel rest, ongoing NG decompression, hydration, mobilization, change as many medicines as possible to IV.  We'll follow with you. Two-view abdominal film ordered for the AM.   Quency Tober 04/24/2012, 10:49 AM

## 2012-04-24 NOTE — ED Notes (Signed)
[name] with [facility] given report

## 2012-04-25 ENCOUNTER — Inpatient Hospital Stay (HOSPITAL_COMMUNITY): Payer: Medicare Other

## 2012-04-25 ENCOUNTER — Encounter (HOSPITAL_COMMUNITY): Payer: Self-pay | Admitting: General Practice

## 2012-04-25 LAB — BASIC METABOLIC PANEL
BUN: 19 mg/dL (ref 6–23)
Chloride: 96 mEq/L (ref 96–112)
GFR calc non Af Amer: 35 mL/min — ABNORMAL LOW (ref >90)
Glucose, Bld: 79 mg/dL (ref 70–99)
Potassium: 4.1 mEq/L (ref 3.5–5.1)
Sodium: 135 mEq/L (ref 135–145)

## 2012-04-25 LAB — URINE CULTURE: Colony Count: 40000

## 2012-04-25 LAB — URINALYSIS, ROUTINE W REFLEX MICROSCOPIC
Glucose, UA: NEGATIVE mg/dL
Protein, ur: NEGATIVE mg/dL
Specific Gravity, Urine: 1.016 (ref 1.005–1.030)
Urobilinogen, UA: 0.2 mg/dL (ref 0.0–1.0)

## 2012-04-25 LAB — URINE MICROSCOPIC-ADD ON

## 2012-04-25 LAB — CBC
HCT: 40.3 % (ref 36.0–46.0)
MCH: 29.6 pg (ref 26.0–34.0)
MCHC: 31.8 g/dL (ref 30.0–36.0)
MCV: 93.1 fL (ref 78.0–100.0)
RDW: 16 % — ABNORMAL HIGH (ref 11.5–15.5)
WBC: 20.5 10*3/uL — ABNORMAL HIGH (ref 4.0–10.5)

## 2012-04-25 MED ORDER — LORAZEPAM 0.5 MG PO TABS
0.5000 mg | ORAL_TABLET | Freq: Every evening | ORAL | Status: AC | PRN
  Administered 2012-04-25: 0.5 mg via ORAL
  Filled 2012-04-25: qty 1

## 2012-04-25 MED ORDER — METHYLPREDNISOLONE SODIUM SUCC 40 MG IJ SOLR
40.0000 mg | Freq: Every day | INTRAMUSCULAR | Status: DC
  Filled 2012-04-25: qty 1

## 2012-04-25 MED ORDER — METHYLPREDNISOLONE SODIUM SUCC 40 MG IJ SOLR
10.0000 mg | Freq: Every day | INTRAMUSCULAR | Status: DC
  Administered 2012-04-25: 10 mg via INTRAVENOUS
  Filled 2012-04-25 (×2): qty 0.25

## 2012-04-25 MED ORDER — TACROLIMUS 1 MG PO CAPS
3.0000 mg | ORAL_CAPSULE | Freq: Two times a day (BID) | ORAL | Status: DC
  Administered 2012-04-25 (×2): 3 mg via SUBLINGUAL
  Filled 2012-04-25 (×4): qty 3

## 2012-04-25 MED ORDER — METOPROLOL TARTRATE 1 MG/ML IV SOLN
5.0000 mg | INTRAVENOUS | Status: DC | PRN
  Filled 2012-04-25: qty 5

## 2012-04-25 MED ORDER — SODIUM CHLORIDE 0.9 % IV SOLN
INTRAVENOUS | Status: AC
  Administered 2012-04-26: 05:00:00 via INTRAVENOUS

## 2012-04-25 MED ORDER — MYCOPHENOLATE MOFETIL HCL 500 MG IV SOLR
500.0000 mg | Freq: Two times a day (BID) | INTRAVENOUS | Status: DC
  Administered 2012-04-25 (×2): 500 mg via INTRAVENOUS
  Filled 2012-04-25 (×4): qty 15

## 2012-04-25 NOTE — Progress Notes (Signed)
MEDICATION RELATED CONSULT NOTE - INITIAL   Pharmacy Consult for: Conversion of PO Transplant medications to IV or SL Indication: Chronic immunosuppression in history of renal transplant  Assessment: 70yoF with a history of renal transplant admitted with partial small bowel obstruction being continued on immunosuppressants to be converted to IV (Mycophenolate/methylprenisolone) & SL (tacrolimus). Patients home medications include Myfortic EC 367m BID, prednisone 594mdaily, & tacrolimus 15m5mO twice daily. Per Dr. SinCandiss Norseould like double the home steroid dose while hospitalized.  Plan:  1) Change myfortic EC tablets to mycophenolate 500 IV Q12 hours  2) Continue tacrolimus 3mg54m Q12 hours 3) Change Solumedrol IV to 10mg51mly 4) Monitor clinical status, plans for management, change back to PO home regimen when appropriate.   KristWoodroe ChenrmD    04/25/2012   8:34 AM   ----------------------------------  Allergies  Allergen Reactions  . Amoxicillin Anaphylaxis  . Ampicillin Anaphylaxis  . Ciprofloxacin Swelling    "of my throat"  . Erythromycin Anaphylaxis  . Penicillins Anaphylaxis  . Sulfa Antibiotics Itching  . Sulfamethoxazole W-Trimethoprim Itching  . Tetracycline Anaphylaxis  . Labetalol Nausea And Vomiting   Patient Measurements: Height: 5' 3"  (160 cm) Weight: 138 lb (62.596 kg) IBW/kg (Calculated) : 52.4   Vital Signs: Temp: 98.4 F (36.9 C) (12/18 0600) BP: 124/62 mmHg (12/18 0600) Pulse Rate: 74  (12/18 0600)  Labs:  Basename 04/25/12 0610 04/24/12 0624 04/23/12 1850  WBC 20.5* 14.6* 15.1*  HGB 12.8 10.6* 11.4*  HCT 40.3 34.1* 35.8*  PLT 192 167 177  APTT -- -- 42*  CREATININE 1.48* 1.51* 1.70*  LABCREA -- -- --  CREATININE 1.48* 1.51* 1.70*  CREAT24HRUR -- -- --  MG 1.5 -- --  PHOS -- -- --  ALBUMIN -- -- 4.2  PROT -- -- 7.6  ALBUMIN -- -- 4.2  AST -- -- 13  ALT -- -- 9  ALKPHOS -- -- 141*  BILITOT -- -- 0.4  BILIDIR -- -- --  IBILI  -- -- --   Estimated Creatinine Clearance: 30.1 ml/min (by C-G formula based on Cr of 1.48).   Medical History: Past Medical History  Diagnosis Date  . Crohn's disease   . Hypertension   . Refusal of blood transfusion for reasons of conscience 12/20/2011    pt is Jehovah's Witness  . Atrial fibrillation   . DVT of leg (deep venous thrombosis) 2012-8./13/2013    "have had one in each leg now"  . Numbness and tingling of foot     bilaterally  . GERD (gastroesophageal reflux disease)   . Hypothyroidism   . Lower GI bleed 2012  . DVT of lower extremity, bilateral     "have had them in both legs; last one was on the RLE this year" (04/25/2012)  . OSA on CPAP   . Anemia     "when lupus flares up"  . SLE (systemic lupus erythematosus)   . Arthritis     "in my joints"  . Renal disorder     S/P nephrectomy; dialysis; "working fine now" (12/20/11)  . Renal cell carcinoma

## 2012-04-25 NOTE — Progress Notes (Signed)
[facility]                                                                                Patient Demographics  [name], is a 71 y.o. female  POE:[id number]  RXV:[id number]  DOB - 1941-01-25  Admit date - 04/23/2012  Admitting Physician Amanda Patience, MD  Outpatient Primary MD for the patient is Amanda L, MD  LOS - 2   Chief Complaint  Patient presents with  . Abdominal Pain        Assessment & Plan    1. Partial small bowel obstruction. A patient with Crohn's disease, multiple GI surgeries in the past including renal transplant. Patient has NG tube, is currently pain-free and nausea free, NG tube has minimal return, she has had 2 BMs since last night and is passing flatus, she feels a whole lot better, marked improvement on abdominal x-ray, general surgery to evaluate the patient and comment on by mouth status today.   2. History of renal transplant. We'll continue transplant medications by mouth have been adjusted after discussions with nephrologist on call and pharmacist on call, will monitor renal function.   3. History of atrial fibrillation. Told will be rate controlled, home dose amiodarone along with beta blocker will be continued, she is already on Procardia which will be continued, Coumadin will be held till her GI issues resolve.  Lab Results  Component Value Date   INR 1.39 04/24/2012   INR 1.33 04/23/2012   INR 1.20 12/21/2011    4. History of Crohn's disease. Supportive care for now patient GI followup.    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 Creatinine better or at baseline which is around 1.5. Gentle IV fluids for hydration.   6. Mild leukocytosis likely reactionary from #1 above, no signs of infection she is afebrile, will repeat UA along with urine cultures .    Code Status: Full  Family Communication: Discussed with the patient  Disposition Plan: Home   Procedures NG tube placement   Consults   surgery   DVT Prophylaxis  SCDs  Lab Results  Component Value Date   PLT 192 04/25/2012    Medications  Scheduled Meds:    . amiodarone  100 mg Oral BID  . methylPREDNISolone (SOLU-MEDROL) injection  10 mg Intravenous Daily  . metoprolol  100 mg Oral BID  . mycophenolate (CELLCEPT) IV  500 mg Intravenous Q12H  . NIFEdipine  60 mg Oral Daily  . tacrolimus  3 mg Sublingual BID   Continuous Infusions:    . sodium chloride     PRN Meds:.HYDROmorphone (DILAUDID) injection, metoprolol, ondansetron (ZOFRAN) IV, ondansetron  Antibiotics    Anti-infectives    None       Time Spent in minutes   35   [name] [name] on 04/25/2012 at 10:50 AM  Between 7am to 7pm - Pager - [phone]  After 7pm go to [email or web address] - password [email or web address]  And look for the night coverage person covering for me after hours  [facility]  [phone]    Subjective:   [name] today has, No headache, No chest pain, No abdominal pain - No Nausea,  No new weakness tingling or numbness, No Cough - SOB. Has had 2 BMs since last night.  Objective:   Filed Vitals:   04/24/12 1412 04/24/12 2200 04/24/12 2225 04/25/12 0600  BP: 142/72 156/85 150/82 124/62  Pulse: 66 72 76 74  Temp: 98.5 F (36.9 C) 98.4 F (36.9 C)  98.4 F (36.9 C)  TempSrc:      Resp: 18 18  18   Height:      Weight:      SpO2: 100% 92%  94%    Wt Readings from Last 3 Encounters:  04/23/12 62.596 kg (138 lb)  12/20/11 60.419 kg (133 lb 3.2 oz)     Intake/Output Summary (Last 24 hours) at 04/25/12 1050 Last data filed at 04/24/12 2000  Gross per 24 hour  Intake      0 ml  Output     20 ml  Net    -20 ml    Exam Awake Alert, Oriented X 3, No new F.N deficits, Normal affect [name],PERRAL Supple Neck,No JVD, No cervical lymphadenopathy appriciated.  Symmetrical Chest wall movement, Good air movement bilaterally, CTAB RRR,No Gallops,Rubs or new Murmurs, No Parasternal  Heave Hypoactive B.Sounds, Abd Soft, Non tender, No organomegaly appriciated, No rebound - guarding or rigidity. NG in place No Cyanosis, Clubbing or edema, No new Rash or bruise      Data Review   Micro Results Recent Results (from the past 240 hour(s))  URINE CULTURE     Status: Normal (Preliminary result)   Collection Time   04/23/12  5:57 PM      Component Value Range Status Comment   Specimen Description URINE, CLEAN CATCH   Final    Special Requests NONE   Final    Culture  Setup Time 04/24/2012 02:01   Final    Colony Count 40,000 COLONIES/ML   Final    Culture GRAM NEGATIVE RODS   Final    Report Status PENDING   Incomplete     Radiology Reports Ct Abdomen Pelvis Wo Contrast  04/23/2012  *RADIOLOGY REPORT*  Clinical Data: Abdominopelvic pain, nausea/vomiting, history of renal transplant  CT ABDOMEN AND PELVIS WITHOUT CONTRAST  Technique:  Multidetector CT imaging of the abdomen and pelvis was performed following the standard protocol without intravenous contrast.  Comparison: 12/11/2010  Findings: Linear scarring at the lung bases with calcifications along the right hemidiaphragm.  Coronary atherosclerosis.  Stable 1.5 x 1.8 cm right retrocrural fluid density lesion (series 2/image 8), likely benign.  Stable probable cyst in the posterior segment right hepatic lobe (series 2/image 15).  Calcified splenic granulomata with stable faint capsular calcification.  Pancreas is atrophic.  Bilateral adrenal glands are unremarkable.  Status post cholecystectomy.  No intrahepatic or extrahepatic ductal dilatation.  Status post bilateral nephrectomy.  Right lower quadrant renal transplant without hydronephrosis.  Multiple dilated loops of small bowel in the right mid/lower abdomen, measuring up to 5.0 cm (series 2/image 39).  The terminal ileum is decompressed (series 2/image 47). Colon is relatively decompressed.  While no definite focal transition is visualized, the appearance is worrisome for  at least partial small bowel obstruction.  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  Atherosclerotic calcifications of the abdominal aorta and branch vessels.  No abdominopelvic ascites.  No suspicious abdominopelvic lymphadenopathy.  Uterus is unremarkable.  No adnexal masses.  Bladder is within normal limits.  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  No focal osseous lesions.  IMPRESSION: Multiple dilated loops of small bowel in the  right mid/lower abdomen, worrisome for at least partial small bowel obstruction.  Right lower quadrant renal transplant without hydronephrosis.  Status post bilateral nephrectomy.  Additional stable ancillary findings as above.   Original Report Authenticated By: Julian Hy, [facility]far[facility].    Dg Chest 2 View  04/10/2012  *RADIOLOGY REPORT*  Clinical Data: Cough, congestion  CHEST - 2 VIEW  Comparison: Portable chest x-ray of 12/15/2010  Findings: No active infiltrate or effusion is seen.  Calcified pleural plaque at the right lung base is stable.  Mild cardiomegaly is stable.  No acute bony abnormality is seen with degenerative change in the mid thoracic spine again noted.  IMPRESSION: Stable chest x-ray.  Mild cardiomegaly.  No active lung disease.   Original Report Authenticated By: Ivar Drape, Mcfarland.    Dg Abd Acute W/chest  04/23/2012  *RADIOLOGY REPORT*  Clinical Data: Abdominal pain, vomiting  ACUTE ABDOMEN SERIES (ABDOMEN 2 VIEW & CHEST 1 VIEW)  Comparison: Chest radiographs dated 04/10/2012.  CT abdomen pelvis dated 12/11/2010.  Findings: Chronic interstitial markings/bronchitic changes.  No focal consolidation. No pleural effusion or pneumothorax.  Cardiomediastinal silhouette is within normal limits.  Nonspecific bowel gas pattern without disproportionate small bowel dilatation to suggest small bowel obstruction. No evidence of free air under the diaphragm on the upright view.  Surgical clips in the bilateral abdomen.  IVC filter at L3-4.  IMPRESSION:  No evidence of acute cardiopulmonary disease.  No evidence of small bowel obstruction or free air.   Original Report Authenticated By: Julian Hy, Mcfarland.     CBC  Lab 04/25/12 0610 04/24/12 0624 04/23/12 1850 04/19/12 1350  WBC 20.5* 14.6* 15.1* --  HGB 12.8 10.6* 11.4* 11.5*  HCT 40.3 34.1* 35.8* --  PLT 192 167 177 --  MCV 93.1 92.7 90.9 --  MCH 29.6 28.8 28.9 --  MCHC 31.8 31.1 31.8 --  RDW 16.0* 15.7* 15.4 --  LYMPHSABS -- -- 1.0 --  MONOABS -- -- 1.1* --  EOSABS -- -- 0.0 --  BASOSABS -- -- 0.0 --  BANDABS -- -- -- --    Chemistries   Lab 04/25/12 0610 04/24/12 0624 04/23/12 1850 04/19/12 1342  NA 135 138 135 138  Amanda 4.1 4.5 4.3 4.2  CL 96 104 99 101  CO2 23 23 22 23   GLUCOSE 79 107* 140* 99  BUN 19 24* 28* 27*  CREATININE 1.48* 1.51* 1.70* 1.65*  CALCIUM 9.5 9.0 9.9 9.7  MG 1.5 -- -- --  AST -- -- 13 --  ALT -- -- 9 --  ALKPHOS -- -- 141* --  BILITOT -- -- 0.4 --   ------------------------------------------------------------------------------------------------------------------ estimated creatinine clearance is 30.1 ml/min (by C-G formula based on Cr of 1.48). ------------------------------------------------------------------------------------------------------------------ No results found for this basename: HGBA1C:2 in the last 72 hours ------------------------------------------------------------------------------------------------------------------ No results found for this basename: CHOL:2,HDL:2,LDLCALC:2,TRIG:2,CHOLHDL:2,LDLDIRECT:2 in the last 72 hours ------------------------------------------------------------------------------------------------------------------ No results found for this basename: TSH,T4TOTAL,FREET3,T3FREE,THYROIDAB in the last 72 hours ------------------------------------------------------------------------------------------------------------------ No results found for this basename:  VITAMINB12:2,FOLATE:2,FERRITIN:2,TIBC:2,IRON:2,RETICCTPCT:2 in the last 72 hours  Coagulation profile  Lab 04/24/12 0624 04/23/12 1850  INR 1.39 1.33  PROTIME -- --    No results found for this basename: DDIMER:2 in the last 72 hours  Cardiac Enzymes No results found for this basename: CK:3,CKMB:3,TROPONINI:3,MYOGLOBIN:3 in the last 168 hours ------------------------------------------------------------------------------------------------------------------ No components found with this basename: POCBNP:3

## 2012-04-25 NOTE — Progress Notes (Signed)
Subjective: Feels much better, +BM yesterday and this AM.  Objective: Vital signs in last 24 hours: Temp:  [98.4 F (36.9 C)-98.5 F (36.9 C)] 98.4 F (36.9 C) (12/18 0600) Pulse Rate:  [66-76] 74  (12/18 0600) Resp:  [18] 18  (12/18 0600) BP: (124-156)/(62-85) 124/62 mmHg (12/18 0600) SpO2:  [92 %-100 %] 94 % (12/18 0600) Last BM Date: 04/24/12  20 ML recorded from NG, nothing else. NPO Afebrile, VSS, WBC is up to 20.5 K, creatinine is slightly better. Film OK this am no gaseous bowel distension, scattered and mainly in colonic loops  Intake/Output from previous day: 12/17 0701 - 12/18 0700 In: 0  Out: 20 [Emesis/NG output:20] Intake/Output this shift:    General appearance: alert, cooperative and no distress GI: soft, non-tender; bowel sounds normal; no masses,  no organomegaly and distension is much better.    Lab Results:   [facility] 04/25/12 0610 04/24/12 0624  WBC 20.5* 14.6*  HGB 12.8 10.6*  HCT 40.3 34.1*  PLT 192 167    BMET  Basename 04/25/12 0610 04/24/12 0624  NA 135 138  K 4.1 4.5  CL 96 104  CO2 23 23  GLUCOSE 79 107*  BUN 19 24*  CREATININE 1.48* 1.51*  CALCIUM 9.5 9.0   PT/INR  Basename 04/24/12 0624 04/23/12 1850  LABPROT 16.7* 16.2*  INR 1.39 1.33     Lab 04/23/12 1850 04/19/12 1342  AST 13 --  ALT 9 --  ALKPHOS 141* --  BILITOT 0.4 --  PROT 7.6 --  ALBUMIN 4.2 3.9     Lipase     Component Value Date/Time   LIPASE 19 04/23/2012 1850     Studies/Results: Ct Abdomen Pelvis Wo Contrast  04/23/2012  *RADIOLOGY REPORT*  Clinical Data: Abdominopelvic pain, nausea/vomiting, history of renal transplant  CT ABDOMEN AND PELVIS WITHOUT CONTRAST  Technique:  Multidetector CT imaging of the abdomen and pelvis was performed following the standard protocol without intravenous contrast.  Comparison: 12/11/2010  Findings: Linear scarring at the lung bases with calcifications along the right hemidiaphragm.  Coronary atherosclerosis.   Stable 1.5 x 1.8 cm right retrocrural fluid density lesion (series 2/image 8), likely benign.  Stable probable cyst in the posterior segment right hepatic lobe (series 2/image 15).  Calcified splenic granulomata with stable faint capsular calcification.  Pancreas is atrophic.  Bilateral adrenal glands are unremarkable.  Status post cholecystectomy.  No intrahepatic or extrahepatic ductal dilatation.  Status post bilateral nephrectomy.  Right lower quadrant renal transplant without hydronephrosis.  Multiple dilated loops of small bowel in the right mid/lower abdomen, measuring up to 5.0 cm (series 2/image 39).  The terminal ileum is decompressed (series 2/image 47). Colon is relatively decompressed.  While no definite focal transition is visualized, the appearance is worrisome for at least partial small bowel obstruction.  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  Atherosclerotic calcifications of the abdominal aorta and branch vessels.  No abdominopelvic ascites.  No suspicious abdominopelvic lymphadenopathy.  Uterus is unremarkable.  No adnexal masses.  Bladder is within normal limits.  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  No focal osseous lesions.  IMPRESSION: Multiple dilated loops of small bowel in the right mid/lower abdomen, worrisome for at least partial small bowel obstruction.  Right lower quadrant renal transplant without hydronephrosis.  Status post bilateral nephrectomy.  Additional stable ancillary findings as above.   Original Report Authenticated By: Julian Hy, M.D.    Dg Chest 2 View  04/25/2012  *RADIOLOGY REPORT*  Clinical Data:  Cough  CHEST - 2 VIEW  Comparison: Chest x-ray of 04/23/2012  Findings: There is bibasilar linear atelectasis or scarring present.  No definite infiltrate or effusion is seen.  Mild cardiomegaly is stable.  An NG tube is present with the tip in the region of the body of the stomach.  The bones are osteopenic but no acute bony  abnormality is seen.  Multiple surgical clips are present within the upper abdomen.  IMPRESSION: Bibasilar linear atelectasis or scarring.  Stable cardiomegaly.   Original Report Authenticated By: Ivar Drape, M.D.    Dg Abd 2 Views  04/25/2012  *RADIOLOGY REPORT*  Clinical Data: Weakness.  Aches all over.  Small bowel obstruction.  ABDOMEN - 2 VIEW  Comparison: 04/24/2012  Findings: Upright film shows no evidence for intraperitoneal free air. NG tube tip is in the distal stomach.  IVC filter again noted. Numerous surgical clips are seen in the central abdomen.  No gaseous bowel dilatation on the current study.  Scattered bowel gas seen mainly and colonic loops.  IMPRESSION: No evidence for bowel perforation.  The bowel gas pattern is nonobstructive on the current study.   Original Report Authenticated By: Misty Stanley, M.D.    Dg Abd 2 Views  04/24/2012  *RADIOLOGY REPORT*  Clinical Data: Small bowel obstruction.  ABDOMEN - 2 VIEW  Comparison: CT abdomen and pelvis 04/23/2012.  Findings: NG tube is in place.  Multiple surgical clips and an IVC filter are again seen.  Short air fluid levels in small bowel persist.  Contrast material from the patient's CT scan are seen in distal small bowel loops.  Small bowel loops are distended up to 5.1 cm, not markedly changed.  IMPRESSION: No marked change in small bowel obstruction.   Original Report Authenticated By: Orlean Patten, M.D.    Dg Abd Acute W/chest  04/23/2012  *RADIOLOGY REPORT*  Clinical Data: Abdominal pain, vomiting  ACUTE ABDOMEN SERIES (ABDOMEN 2 VIEW & CHEST 1 VIEW)  Comparison: Chest radiographs dated 04/10/2012.  CT abdomen pelvis dated 12/11/2010.  Findings: Chronic interstitial markings/bronchitic changes.  No focal consolidation. No pleural effusion or pneumothorax.  Cardiomediastinal silhouette is within normal limits.  Nonspecific bowel gas pattern without disproportionate small bowel dilatation to suggest small bowel obstruction. No  evidence of free air under the diaphragm on the upright view.  Surgical clips in the bilateral abdomen.  IVC filter at L3-4.  IMPRESSION: No evidence of acute cardiopulmonary disease.  No evidence of small bowel obstruction or free air.   Original Report Authenticated By: Julian Hy, M.D.     Medications:    . amiodarone  100 mg Oral BID  . methylPREDNISolone (SOLU-MEDROL) injection  10 mg Intravenous Daily  . metoprolol  100 mg Oral BID  . mycophenolate (CELLCEPT) IV  500 mg Intravenous Q12H  . NIFEdipine  60 mg Oral Daily  . tacrolimus  3 mg Sublingual BID    Assessment/Plan 1. SBO, status post bilateral nephrectomies, renal transplant, cholecystectomy, C-section.  2. History of Crohn's disease; no medications since transplant 2006.  3. History of SLE  4. Jehovah witness/no blood transfusions.  5. History of PAF, on Coumadin/amiodarone/beta blocker  6. A renal cell carcinoma with bilateral nephrectomies, on hemodialysis from 1990s till transplant 2006. Renal transplant, on chronic immunosuppressants and prednisone .  7. GERD  8. Obstructive sleep apnea with CPAP  9. Chronic anemia  10. Hypothyroid on supplement.  11. History of DVT on Coumadin   Plan:  I am going to clamp  her NG tube and start some clears.  Her abdomen is much better.  Hope to pull NG later today.  I'm not sure where elevated WBC is coming from, but her abdominal exam is much better.       LOS: 2 days    [name] 04/25/2012

## 2012-04-25 NOTE — Progress Notes (Signed)
Patient interviewed and examined, agree with NP note above. Doing well, SBO resolving Edward Jolly MD, FACS  04/25/2012 5:38 PM

## 2012-04-26 LAB — BASIC METABOLIC PANEL
CO2: 19 mEq/L (ref 19–32)
Chloride: 96 mEq/L (ref 96–112)
GFR calc Af Amer: 48 mL/min — ABNORMAL LOW (ref >90)
Potassium: 4.1 mEq/L (ref 3.5–5.1)
Sodium: 131 mEq/L — ABNORMAL LOW (ref 135–145)

## 2012-04-26 LAB — CBC
Platelets: 151 10*3/uL (ref 150–400)
RBC: 4.21 MIL/uL (ref 3.87–5.11)
RDW: 15.9 % — ABNORMAL HIGH (ref 11.5–15.5)
WBC: 19.5 10*3/uL — ABNORMAL HIGH (ref 4.0–10.5)

## 2012-04-26 LAB — OSMOLALITY, URINE: Osmolality, Ur: 599 mOsm/kg (ref 390–1090)

## 2012-04-26 LAB — SODIUM, URINE, RANDOM: Sodium, Ur: 107 mEq/L

## 2012-04-26 LAB — MAGNESIUM: Magnesium: 1.5 mg/dL (ref 1.5–2.5)

## 2012-04-26 MED ORDER — PREDNISONE 5 MG PO TABS
5.0000 mg | ORAL_TABLET | Freq: Every day | ORAL | Status: DC
  Administered 2012-04-26 – 2012-04-28 (×3): 5 mg via ORAL
  Filled 2012-04-26 (×4): qty 1

## 2012-04-26 MED ORDER — MYCOPHENOLATE MOFETIL 250 MG PO CAPS: 360.0000 mg | ORAL_CAPSULE | Freq: Two times a day (BID) | ORAL | Status: DC

## 2012-04-26 MED ORDER — ACETAMINOPHEN 325 MG PO TABS
650.0000 mg | ORAL_TABLET | Freq: Four times a day (QID) | ORAL | Status: DC | PRN
  Administered 2012-04-27 – 2012-04-28 (×5): 650 mg via ORAL
  Filled 2012-04-26 (×4): qty 2

## 2012-04-26 MED ORDER — WARFARIN - PHARMACIST DOSING INPATIENT: Freq: Every day | Status: DC

## 2012-04-26 MED ORDER — WARFARIN SODIUM 7.5 MG PO TABS
7.5000 mg | ORAL_TABLET | Freq: Once | ORAL | Status: AC
  Administered 2012-04-26: 7.5 mg via ORAL
  Filled 2012-04-26: qty 1

## 2012-04-26 MED ORDER — MYCOPHENOLATE SODIUM 180 MG PO TBEC
360.0000 mg | DELAYED_RELEASE_TABLET | Freq: Two times a day (BID) | ORAL | Status: DC
  Filled 2012-04-26 (×2): qty 2

## 2012-04-26 MED ORDER — TACROLIMUS 1 MG PO CAPS
6.0000 mg | ORAL_CAPSULE | Freq: Two times a day (BID) | ORAL | Status: DC
  Administered 2012-04-26 – 2012-04-27 (×3): 6 mg via ORAL
  Filled 2012-04-26 (×4): qty 6

## 2012-04-26 MED ORDER — MYCOPHENOLATE SODIUM 180 MG PO TBEC
360.0000 mg | DELAYED_RELEASE_TABLET | Freq: Two times a day (BID) | ORAL | Status: DC
  Administered 2012-04-26 – 2012-04-27 (×4): 360 mg via ORAL
  Filled 2012-04-26 (×4): qty 2

## 2012-04-26 NOTE — Progress Notes (Addendum)
[facility]                                                                                Patient Demographics  [name], is a 71 y.o. female  KPV:[id number]  [id number]  DOB - 1940/06/22  Admit date - 04/23/2012  Admitting Physician [name], MD  Outpatient Primary MD for the patient is DETERDING,[name], MD  LOS - 3   Chief Complaint  Patient presents with  . Abdominal Pain        Assessment & Plan    1. Partial small bowel obstruction. A patient with Crohn's disease, multiple GI surgeries in the past including renal transplant. Patient now having bowel movements, passing flatus, abdominal pain and nausea free, NG tube has been removed and patient has been started on clears on 04/26/2012 by general surgery, monitor clinically closely.  2. History of renal transplant. We'll continue transplant medications by mouth pharmacy to switch back at home dose.  3. History of atrial fibrillation. Goal is rate controlled, we'll resume home medications including Coumadin.   Lab Results  Component Value Date   INR 1.39 04/24/2012   INR 1.33 04/23/2012   INR 1.20 12/21/2011    4. History of Crohn's disease. Supportive care for now patient GI followup.    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 Creatinine better or at baseline which is around 1.5. Gentle IV fluids for hydration.   6. Mild leukocytosis likely reactionary from #1 above, no signs of infection she is afebrile, stable repeat UA, stable 2 view chest x-ray, continue to monitor.   7. Mild hyponatremia. Will check urine sodium and osmolality, serum osmolality, hold IV fluids for now, repeat BMP in the morning.    Code Status: Full  Family Communication: Discussed with the patient  Disposition Plan: Home   Procedures NG tube placement   Consults  surgery   DVT Prophylaxis  SCDs  Lab Results  Component Value Date   PLT 151 04/26/2012    Medications  Scheduled  Meds:    . amiodarone  100 mg Oral BID  . methylPREDNISolone (SOLU-MEDROL) injection  10 mg Intravenous Daily  . metoprolol  100 mg Oral BID  . mycophenolate (CELLCEPT) IV  500 mg Intravenous Q12H  . NIFEdipine  60 mg Oral Daily  . tacrolimus  3 mg Sublingual BID   Continuous Infusions:   PRN Meds:.HYDROmorphone (DILAUDID) injection, LORazepam, metoprolol, ondansetron (ZOFRAN) IV, ondansetron  Antibiotics    Anti-infectives    None       Time Spent in minutes   35   Kahley Leib K M.D on 04/26/2012 at 9:15 AM  Between 7am to 7pm - Pager - [phone]  After 7pm go to [email or web address] - password [email or web address]  And look for the night coverage person covering for me after hours  [facility] Office  [phone]    Subjective:   [name] today has, No headache, No chest pain, No abdominal pain - No Nausea, No new weakness tingling or numbness, No Cough - SOB. Has had 2 BMs since last night.  Objective:   Filed Vitals:   04/25/12 1400 04/25/12 2120 04/25/12 2200 04/26/12 0455  BP: 120/52 104/51 106/58 140/58  Pulse: 97 89 86 78  Temp: 98.9 F (37.2 C) 98.2 F (36.8 C)  98.5 F (36.9 C)  TempSrc:  Oral  Oral  Resp: 16 18  18   Height:      Weight:      SpO2:  97%  98%    Wt Readings from Last 3 Encounters:  04/23/12 62.596 kg (138 lb)  12/20/11 60.419 kg (133 lb 3.2 oz)     Intake/Output Summary (Last 24 hours) at 04/26/12 0915 Last data filed at 04/26/12 0456  Gross per 24 hour  Intake    120 ml  Output      6 ml  Net    114 ml    Exam Awake Alert, Oriented X 3, No new F.N deficits, Normal affect [place].AT,PERRAL Supple Neck,No JVD, No cervical lymphadenopathy appriciated.  Symmetrical Chest wall movement, Good air movement bilaterally, CTAB RRR,No Gallops,Rubs or new Murmurs, No Parasternal Heave Hypoactive B.Sounds, Abd Soft, Non tender, No organomegaly appriciated, No rebound - guarding or rigidity. NG in place No Cyanosis, Clubbing or  edema, No new Rash or bruise      Data Review   Micro Results Recent Results (from the past 240 hour(s))  URINE CULTURE     Status: Normal   Collection Time   04/23/12  5:57 PM      Component Value Range Status Comment   Specimen Description URINE, CLEAN CATCH   Final    Special Requests NONE   Final    Culture  Setup Time 04/24/2012 02:01   Final    Colony Count 40,000 COLONIES/ML   Final    Culture ENTEROBACTER CLOACAE   Final    Report Status 04/25/2012 FINAL   Final    Organism ID, Bacteria ENTEROBACTER CLOACAE   Final     Radiology Reports Ct Abdomen Pelvis Wo Contrast  04/23/2012  *RADIOLOGY REPORT*  Clinical Data: Abdominopelvic pain, nausea/vomiting, history of renal transplant  CT ABDOMEN AND PELVIS WITHOUT CONTRAST  Technique:  Multidetector CT imaging of the abdomen and pelvis was performed following the standard protocol without intravenous contrast.  Comparison: 12/11/2010  Findings: Linear scarring at the lung bases with calcifications along the right hemidiaphragm.  Coronary atherosclerosis.  Stable 1.5 x 1.8 cm right retrocrural fluid density lesion (series 2/image 8), likely benign.  Stable probable cyst in the posterior segment right hepatic lobe (series 2/image 15).  Calcified splenic granulomata with stable faint capsular calcification.  Pancreas is atrophic.  Bilateral adrenal glands are unremarkable.  Status post cholecystectomy.  No intrahepatic or extrahepatic ductal dilatation.  Status post bilateral nephrectomy.  Right lower quadrant renal transplant without hydronephrosis.  Multiple dilated loops of small bowel in the right mid/lower abdomen, measuring up to 5.0 cm (series 2/image 39).  The terminal ileum is decompressed (series 2/image 47). Colon is relatively decompressed.  While no definite focal transition is visualized, the appearance is worrisome for at least partial small bowel obstruction.  Laxity of the right lateral abdominal wall/incisional hernia,  unchanged.  Atherosclerotic calcifications of the abdominal aorta and branch vessels.  No abdominopelvic ascites.  No suspicious abdominopelvic lymphadenopathy.  Uterus is unremarkable.  No adnexal masses.  Bladder is within normal limits.  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  No focal osseous lesions.  IMPRESSION: Multiple dilated loops of small bowel in the right mid/lower abdomen, worrisome for at least partial small bowel obstruction.  Right lower quadrant renal transplant without hydronephrosis.  Status post bilateral nephrectomy.  Additional  stable ancillary findings as above.   Original Report Authenticated By: Julian Hy, M.D.    Dg Chest 2 View  04/10/2012  *RADIOLOGY REPORT*  Clinical Data: Cough, congestion  CHEST - 2 VIEW  Comparison: Portable chest x-ray of 12/15/2010  Findings: No active infiltrate or effusion is seen.  Calcified pleural plaque at the right lung base is stable.  Mild cardiomegaly is stable.  No acute bony abnormality is seen with degenerative change in the mid thoracic spine again noted.  IMPRESSION: Stable chest x-ray.  Mild cardiomegaly.  No active lung disease.   Original Report Authenticated By: Ivar Drape, M.D.    Dg Abd Acute W/chest  04/23/2012  *RADIOLOGY REPORT*  Clinical Data: Abdominal pain, vomiting  ACUTE ABDOMEN SERIES (ABDOMEN 2 VIEW & CHEST 1 VIEW)  Comparison: Chest radiographs dated 04/10/2012.  CT abdomen pelvis dated 12/11/2010.  Findings: Chronic interstitial markings/bronchitic changes.  No focal consolidation. No pleural effusion or pneumothorax.  Cardiomediastinal silhouette is within normal limits.  Nonspecific bowel gas pattern without disproportionate small bowel dilatation to suggest small bowel obstruction. No evidence of free air under the diaphragm on the upright view.  Surgical clips in the bilateral abdomen.  IVC filter at L3-4.  IMPRESSION: No evidence of acute cardiopulmonary disease.  No evidence of small bowel obstruction or free air.    Original Report Authenticated By: Julian Hy, M.D.     CBC  Lab 04/26/12 0542 04/25/12 0610 04/24/12 0624 04/23/12 1850 04/19/12 1350  WBC 19.5* 20.5* 14.6* 15.1* --  HGB 12.3 12.8 10.6* 11.4* 11.5*  HCT 38.3 40.3 34.1* 35.8* --  PLT 151 192 167 177 --  MCV 91.0 93.1 92.7 90.9 --  MCH 29.2 29.6 28.8 28.9 --  MCHC 32.1 31.8 31.1 31.8 --  RDW 15.9* 16.0* 15.7* 15.4 --  LYMPHSABS -- -- -- 1.0 --  MONOABS -- -- -- 1.1* --  EOSABS -- -- -- 0.0 --  BASOSABS -- -- -- 0.0 --  BANDABS -- -- -- -- --    Chemistries   Lab 04/26/12 0542 04/25/12 0610 04/24/12 0624 04/23/12 1850 04/19/12 1342  NA 131* 135 138 135 138  K 4.1 4.1 4.5 4.3 4.2  CL 96 96 104 99 101  CO2 19 23 23 22 23   GLUCOSE 88 79 107* 140* 99  BUN 20 19 24* 28* 27*  CREATININE 1.29* 1.48* 1.51* 1.70* 1.65*  CALCIUM 9.1 9.5 9.0 9.9 9.7  MG 1.5 1.5 -- -- --  AST -- -- -- 13 --  ALT -- -- -- 9 --  ALKPHOS -- -- -- 141* --  BILITOT -- -- -- 0.4 --   ------------------------------------------------------------------------------------------------------------------ estimated creatinine clearance is 34.5 ml/min (by C-G formula based on Cr of 1.29). ------------------------------------------------------------------------------------------------------------------ No results found for this basename: HGBA1C:2 in the last 72 hours ------------------------------------------------------------------------------------------------------------------ No results found for this basename: CHOL:2,HDL:2,LDLCALC:2,TRIG:2,CHOLHDL:2,LDLDIRECT:2 in the last 72 hours ------------------------------------------------------------------------------------------------------------------ No results found for this basename: TSH,T4TOTAL,FREET3,T3FREE,THYROIDAB in the last 72 hours ------------------------------------------------------------------------------------------------------------------ No results found for this basename:  VITAMINB12:2,FOLATE:2,FERRITIN:2,TIBC:2,IRON:2,RETICCTPCT:2 in the last 72 hours  Coagulation profile  Lab 04/24/12 0624 04/23/12 1850  INR 1.39 1.33  PROTIME -- --    No results found for this basename: DDIMER:2 in the last 72 hours  Cardiac Enzymes No results found for this basename: CK:3,CKMB:3,TROPONINI:3,MYOGLOBIN:3 in the last 168 hours ------------------------------------------------------------------------------------------------------------------ No components found with this basename: POCBNP:3

## 2012-04-26 NOTE — Progress Notes (Signed)
Patient interviewed and examined, agree with PA note above. She is doing well and I expect may be able to go home tomorrow. Edward Jolly MD, FACS  04/26/2012 6:30 PM

## 2012-04-26 NOTE — Progress Notes (Signed)
Patient ID: [name], female   DOB: [birth year], 71 y.o.   MRN: [id number]    Subjective: Feels much better, tolerating clears .  Objective: Vital signs in last 24 hours: Temp:  [98.2 F (36.8 C)-98.9 F (37.2 C)] 98.5 F (36.9 C) (12/19 0455) Pulse Rate:  [78-97] 78  (12/19 0455) Resp:  [16-18] 18  (12/19 0455) BP: (104-140)/(51-58) 140/58 mmHg (12/19 0455) SpO2:  [97 %-98 %] 98 % (12/19 0455) Last BM Date: 04/25/12  Intake/Output from previous day: 12/18 0701 - 12/19 0700 In: 120 [P.O.:120] Out: 36 [Urine:6; Emesis/NG output:30] Intake/Output this shift:    General appearance: alert, cooperative and no distress GI: soft, non-tender; bowel sounds normal; no masses,  no organomegaly and distension is much better.   Labs: WBC have trended up slightly, H&H stable. Sodium has decreased slightly (asymptomatic) Creatine is improved.  Lab Results:   [facility] 04/26/12 0542 04/25/12 0610  WBC 19.5* 20.5*  HGB 12.3 12.8  HCT 38.3 40.3  PLT 151 192    BMET  Basename 04/26/12 0542 04/25/12 0610  NA 131* 135  K 4.1 4.1  CL 96 96  CO2 19 23  GLUCOSE 88 79  BUN 20 19  CREATININE 1.29* 1.48*  CALCIUM 9.1 9.5   PT/INR  Basename 04/24/12 0624 04/23/12 1850  LABPROT 16.7* 16.2*  INR 1.39 1.33     Lab 04/23/12 1850 04/19/12 1342  AST 13 --  ALT 9 --  ALKPHOS 141* --  BILITOT 0.4 --  PROT 7.6 --  ALBUMIN 4.2 3.9     Lipase     Component Value Date/Time   LIPASE 19 04/23/2012 1850     Studies/Results: Dg Chest 2 View  04/25/2012  *RADIOLOGY REPORT*  Clinical Data: Cough  CHEST - 2 VIEW  Comparison: Chest x-ray of 04/23/2012  Findings: There is bibasilar linear atelectasis or scarring present.  No definite infiltrate or effusion is seen.  Mild cardiomegaly is stable.  An NG tube is present with the tip in the region of the body of the stomach.  The bones are osteopenic but no acute bony abnormality is seen.  Multiple surgical clips are present within the  upper abdomen.  IMPRESSION: Bibasilar linear atelectasis or scarring.  Stable cardiomegaly.   Original Report Authenticated By: Ivar Drape, M.D.    Dg Abd 2 Views  04/25/2012  *RADIOLOGY REPORT*  Clinical Data: Weakness.  Aches all over.  Small bowel obstruction.  ABDOMEN - 2 VIEW  Comparison: 04/24/2012  Findings: Upright film shows no evidence for intraperitoneal free air. NG tube tip is in the distal stomach.  IVC filter again noted. Numerous surgical clips are seen in the central abdomen.  No gaseous bowel dilatation on the current study.  Scattered bowel gas seen mainly and colonic loops.  IMPRESSION: No evidence for bowel perforation.  The bowel gas pattern is nonobstructive on the current study.   Original Report Authenticated By: Misty Stanley, M.D.    Dg Abd 2 Views  04/24/2012  *RADIOLOGY REPORT*  Clinical Data: Small bowel obstruction.  ABDOMEN - 2 VIEW  Comparison: CT abdomen and pelvis 04/23/2012.  Findings: NG tube is in place.  Multiple surgical clips and an IVC filter are again seen.  Short air fluid levels in small bowel persist.  Contrast material from the patient's CT scan are seen in distal small bowel loops.  Small bowel loops are distended up to 5.1 cm, not markedly changed.  IMPRESSION: No marked change in small bowel obstruction.  Original Report Authenticated By: Orlean Patten, M.D.     Medications:    . amiodarone  100 mg Oral BID  . methylPREDNISolone (SOLU-MEDROL) injection  10 mg Intravenous Daily  . metoprolol  100 mg Oral BID  . mycophenolate (CELLCEPT) IV  500 mg Intravenous Q12H  . NIFEdipine  60 mg Oral Daily  . tacrolimus  3 mg Sublingual BID    Assessment/Plan  Patient Active Problem List  Diagnosis  . SLEEP APNEA  . DVT of leg (deep venous thrombosis)  . Atrial fibrillation  . CKD (chronic kidney disease), stage III  . History of renal transplant  . HTN (hypertension)  . Hypothyroidism  . Crohn's disease  . Renal disorder  . Small bowel  obstruction, partial  Johovah witness/no blood transfusions.    Plan:   Continue with clears Monitor WBC unsure as to cause of elevation OOB/ambulate    LOS: 3 days    Hartley Barefoot [facility] Pager # [phone]  04/26/2012

## 2012-04-26 NOTE — Progress Notes (Signed)
ANTICOAGULATION CONSULT NOTE - Initial Consult  Pharmacy Consult for Coumadin Indication: atrial fibrillation and DVT(history)  Allergies  Allergen Reactions  . Amoxicillin Anaphylaxis  . Ampicillin Anaphylaxis  . Ciprofloxacin Swelling    "of my throat"  . Erythromycin Anaphylaxis  . Penicillins Anaphylaxis  . Sulfa Antibiotics Itching  . Sulfamethoxazole W-Trimethoprim Itching  . Tetracycline Anaphylaxis  . Labetalol Nausea And Vomiting    Patient Measurements: Height: 5' 3"  (160 cm) Weight: 138 lb (62.596 kg) IBW/kg (Calculated) : 52.4   Vital Signs: Temp: 98.5 F (36.9 C) (12/19 0455) Temp src: Oral (12/19 0455) BP: 140/58 mmHg (12/19 0455) Pulse Rate: 78  (12/19 0455)  Labs:  Basename 04/26/12 0542 04/25/12 0610 04/24/12 0624 04/23/12 1850  HGB 12.3 12.8 -- --  HCT 38.3 40.3 34.1* --  PLT 151 192 167 --  APTT -- -- -- 42*  LABPROT -- -- 16.7* 16.2*  INR -- -- 1.39 1.33  HEPARINUNFRC -- -- -- --  CREATININE 1.29* 1.48* 1.51* --  CKTOTAL -- -- -- --  CKMB -- -- -- --  TROPONINI -- -- -- --    Estimated Creatinine Clearance: 34.5 ml/min (by C-G formula based on Cr of 1.29).   Medical History: Past Medical History  Diagnosis Date  . Crohn's disease   . Hypertension   . Refusal of blood transfusion for reasons of conscience 12/20/2011    pt is Jehovah's Witness  . Atrial fibrillation   . DVT of leg (deep venous thrombosis) 2012-8./13/2013    "have had one in each leg now"  . Numbness and tingling of foot     bilaterally  . GERD (gastroesophageal reflux disease)   . Hypothyroidism   . Lower GI bleed 2012  . DVT of lower extremity, bilateral     "have had them in both legs; last one was on the RLE this year" (04/25/2012)  . OSA on CPAP   . Anemia     "when lupus flares up"  . SLE (systemic lupus erythematosus)   . Arthritis     "in my joints"  . Renal disorder     S/P nephrectomy; dialysis; "working fine now" (12/20/11)  . Renal cell carcinoma      Medications:  Prescriptions prior to admission  Medication Sig Dispense Refill  . amiodarone (PACERONE) 200 MG tablet Take 100 mg by mouth 2 (two) times daily.       . calcitRIOL (ROCALTROL) 0.25 MCG capsule Take 0.25 mcg by mouth every other day.       . gabapentin (NEURONTIN) 100 MG capsule Take 200 mg by mouth at bedtime.       Marland Kitchen levothyroxine (SYNTHROID, LEVOTHROID) 100 MCG tablet Take 100 mcg by mouth daily.      . metoprolol (LOPRESSOR) 100 MG tablet Take 100 mg by mouth 2 (two) times daily.      . mycophenolate (MYFORTIC) 180 MG EC tablet Take 360 mg by mouth 2 (two) times daily.        Marland Kitchen NIFEdipine (PROCARDIA XL/ADALAT-CC) 60 MG 24 hr tablet Take 60 mg by mouth daily.        Marland Kitchen omeprazole (PRILOSEC) 20 MG capsule Take 20 mg by mouth daily.       . polyethylene glycol (MIRALAX / GLYCOLAX) packet Take 17 g by mouth 2 (two) times daily as needed. For constipation      . predniSONE (DELTASONE) 5 MG tablet Take 5 mg by mouth daily.       . tacrolimus (  PROGRAF) 1 MG capsule Take 1 mg by mouth 2 (two) times daily. With 52m to =652m     . warfarin (COUMADIN) 5 MG tablet Take 1 tablet (5 mg total) by mouth daily.  20 tablet  0  . epoetin alfa (EPOGEN,PROCRIT) 4075916NIT/ML injection Inject 40,000 Units into the skin every 30 (thirty) days.        Assessment: [age] F admitted 12/16 with partial SBO.  Anticoagulation was held at that time for possible need for surgical intervention.  SBO has improved without surgery and patient is currently tolerating PO meds and clear liquids.  Pharmacy has been asked to restart Coumadin for hx AFib and DVT.  Goal of Therapy:  INR 2-3   Plan:  Coumadin 7.5 mg PO x 1 tonight. Daily INR.  Consider restarting Calcitriol, Gabapentin, and Synthroid now that patient is tolerating PO medications.  KiManpower IncPharm.D., BCPS Clinical Pharmacist Pager [phone]/19/2013 11:39 AM

## 2012-04-27 LAB — PROTIME-INR
INR: 1.84 — ABNORMAL HIGH (ref 0.00–1.49)
Prothrombin Time: 20.6 seconds — ABNORMAL HIGH (ref 11.6–15.2)

## 2012-04-27 LAB — BASIC METABOLIC PANEL
GFR calc Af Amer: 44 mL/min — ABNORMAL LOW (ref >90)
GFR calc non Af Amer: 38 mL/min — ABNORMAL LOW (ref >90)
Potassium: 3.8 mEq/L (ref 3.5–5.1)
Sodium: 132 mEq/L — ABNORMAL LOW (ref 135–145)

## 2012-04-27 LAB — CBC
MCHC: 31.7 g/dL (ref 30.0–36.0)
RDW: 15.6 % — ABNORMAL HIGH (ref 11.5–15.5)

## 2012-04-27 LAB — OSMOLALITY: Osmolality: 276 mOsm/kg (ref 275–300)

## 2012-04-27 MED ORDER — TACROLIMUS 1 MG PO CAPS
7.0000 mg | ORAL_CAPSULE | ORAL | Status: DC
Start: 2012-04-28 — End: 2012-04-28
  Administered 2012-04-28: 7 mg via ORAL
  Filled 2012-04-27: qty 7

## 2012-04-27 MED ORDER — TACROLIMUS 1 MG PO CAPS
6.0000 mg | ORAL_CAPSULE | ORAL | Status: DC
  Administered 2012-04-27: 6 mg via ORAL
  Filled 2012-04-27 (×2): qty 6

## 2012-04-27 MED ORDER — WARFARIN SODIUM 2 MG PO TABS
2.0000 mg | ORAL_TABLET | Freq: Once | ORAL | Status: AC
  Administered 2012-04-27: 2 mg via ORAL
  Filled 2012-04-27: qty 1

## 2012-04-27 MED ORDER — MAGNESIUM SULFATE 40 MG/ML IJ SOLN
2.0000 g | Freq: Once | INTRAMUSCULAR | Status: AC
  Administered 2012-04-27: 2 g via INTRAVENOUS
  Filled 2012-04-27: qty 50

## 2012-04-27 MED ORDER — TACROLIMUS 1 MG PO CAPS
1.0000 mg | ORAL_CAPSULE | ORAL | Status: AC
  Administered 2012-04-27: 1 mg via ORAL
  Filled 2012-04-27: qty 1

## 2012-04-27 MED ORDER — SODIUM CHLORIDE 0.9 % IV SOLN: INTRAVENOUS | Status: AC

## 2012-04-27 NOTE — Progress Notes (Signed)
Patient ID: [name], female   DOB: 17-Nov-1940, 71 y.o.   MRN: [id number]    Subjective: Feels much better, tolerating clears . Will advance diet.  Objective: Vital signs in last 24 hours: Temp:  [97.6 F (36.4 C)-98.3 F (36.8 C)] 97.6 F (36.4 C) (12/20 0627) Pulse Rate:  [62-80] 62  (12/20 0627) Resp:  [18-20] 20  (12/20 0627) BP: (83-149)/(48-70) 98/58 mmHg (12/20 0636) SpO2:  [96 %-100 %] 96 % (12/20 0627) Last BM Date: 04/27/12  Intake/Output from previous day:   Intake/Output this shift:    General appearance: alert, cooperative and no distress GI: soft, non-tender; bowel sounds normal; no masses,  no organomegaly and distension is much better.   VSS, afebrile  Lab Results:   [facility] 04/27/12 0615 04/26/12 0542  WBC 17.9* 19.5*  HGB 10.4* 12.3  HCT 32.8* 38.3  PLT 180 151    BMET  Basename 04/27/12 0615 04/26/12 0542  NA 132* 131*  K 3.8 4.1  CL 98 96  CO2 23 19  GLUCOSE 95 88  BUN 21 20  CREATININE 1.40* 1.29*  CALCIUM 8.9 9.1   PT/INR  Basename 04/27/12 0615  LABPROT 20.6*  INR 1.84*     Lab 04/23/12 1850  AST 13  ALT 9  ALKPHOS 141*  BILITOT 0.4  PROT 7.6  ALBUMIN 4.2     Lipase     Component Value Date/Time   LIPASE 19 04/23/2012 1850     Studies/Results: Dg Chest 2 View  04/25/2012  *RADIOLOGY REPORT*  Clinical Data: Cough  CHEST - 2 VIEW  Comparison: Chest x-ray of 04/23/2012  Findings: There is bibasilar linear atelectasis or scarring present.  No definite infiltrate or effusion is seen.  Mild cardiomegaly is stable.  An NG tube is present with the tip in the region of the body of the stomach.  The bones are osteopenic but no acute bony abnormality is seen.  Multiple surgical clips are present within the upper abdomen.  IMPRESSION: Bibasilar linear atelectasis or scarring.  Stable cardiomegaly.   Original Report Authenticated By: Ivar Drape, M.D.    Dg Abd 2 Views  04/25/2012  *RADIOLOGY REPORT*  Clinical Data:  Weakness.  Aches all over.  Small bowel obstruction.  ABDOMEN - 2 VIEW  Comparison: 04/24/2012  Findings: Upright film shows no evidence for intraperitoneal free air. NG tube tip is in the distal stomach.  IVC filter again noted. Numerous surgical clips are seen in the central abdomen.  No gaseous bowel dilatation on the current study.  Scattered bowel gas seen mainly and colonic loops.  IMPRESSION: No evidence for bowel perforation.  The bowel gas pattern is nonobstructive on the current study.   Original Report Authenticated By: Misty Stanley, M.D.     Medications:    . amiodarone  100 mg Oral BID  . magnesium sulfate 1 - 4 g bolus IVPB  2 g Intravenous Once  . metoprolol  100 mg Oral BID  . mycophenolate  360 mg Oral BID  . NIFEdipine  60 mg Oral Daily  . predniSONE  5 mg Oral Q breakfast  . tacrolimus  6 mg Oral BID  . Warfarin - Pharmacist Dosing Inpatient   Does not apply q1800    Assessment/Plan  Patient Active Problem List  Diagnosis  . SLEEP APNEA  . DVT of leg (deep venous thrombosis)  . Atrial fibrillation  . CKD (chronic kidney disease), stage III  . History of renal transplant  . HTN (  hypertension)  . Hypothyroidism  . Crohn's disease  . Renal disorder  . Small bowel obstruction, partial  Johovah witness/no blood transfusions.    Plan:   Advance diet Patient is stable for discharge from surgical standpoint. OOB/ambulate    LOS: 4 days    Hartley Barefoot [facility] Surgery Pager # [phone]  04/27/2012

## 2012-04-27 NOTE — Progress Notes (Signed)
[facility]                                                                                Patient Demographics  [name], is a 71 y.o. female  UUV:[id number]  KVQ:[id number]  DOB - 1941/02/22  Admit date - 04/23/2012  Admitting Physician [name], MD  Outpatient Primary MD for the patient is DETERDING,[name], MD  LOS - 4   Chief Complaint  Patient presents with  . Abdominal Pain        Assessment & Plan    1. Partial small bowel obstruction. A patient with Crohn's disease, multiple GI surgeries in the past including renal transplant. Patient now having bowel movements, passing flatus, abdominal pain and nausea free, NG tube has been removed and will advance to regular diet today, likely AM DC.   2. History of renal transplant. We'll continue transplant medications by mouth pharmacy to switch back at home dose.    3. History of atrial fibrillation. Goal is rate controlled, we'll resume home medications including Coumadin.   Lab Results  Component Value Date   INR 1.84* 04/27/2012   INR 1.39 04/24/2012   INR 1.33 04/23/2012    4. History of Crohn's disease. Supportive care for now patient GI followup.    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 Creatinine better or at baseline which is around 1.5. Gentle IV continue.    6. Mild leukocytosis likely reactionary from #1 above, no signs of infection she is afebrile, stable repeat UA, stable 2 view chest x-ray, cultures suggest contamination, no dysuria, D/W ID Dr Wendie Agreste no need for ABX, continue to monitor.    7. Mild hyponatremia. Will check urine sodium and osmolality, serum osmolality, hold IV fluids for now, repeat BMP in the morning.     Code Status: Full  Family Communication: Discussed with the patient  Disposition Plan: Home   Procedures NG tube placement   Consults  surgery   DVT Prophylaxis  SCDs  Lab Results  Component Value Date   PLT 180 04/27/2012     Medications  Scheduled Meds:    . amiodarone  100 mg Oral BID  . metoprolol  100 mg Oral BID  . mycophenolate  360 mg Oral BID  . NIFEdipine  60 mg Oral Daily  . predniSONE  5 mg Oral Q breakfast  . tacrolimus  6 mg Oral BID  . Warfarin - Pharmacist Dosing Inpatient   Does not apply q1800   Continuous Infusions:    . sodium chloride     PRN Meds:.acetaminophen, HYDROmorphone (DILAUDID) injection, LORazepam, metoprolol, ondansetron (ZOFRAN) IV, ondansetron  Antibiotics    Anti-infectives    None       Time Spent in minutes   35   Caroll Weinheimer K M.D on 04/27/2012 at 10:29 AM  Between 7am to 7pm - Pager - [phone]  After 7pm go to [email or web address] - password [email or web address]  And look for the night coverage person covering for me after hours  [facility] Office  [phone]    Subjective:   [name] today has, No headache, No chest pain, No abdominal pain - No Nausea, No new  weakness tingling or numbness, No Cough - SOB. Has had 2 BMs since last night.  Objective:   Filed Vitals:   04/26/12 1330 04/26/12 2324 04/27/12 0627 04/27/12 0636  BP: 149/70 132/60 83/48 98/58   Pulse: 80 78 62   Temp: 98.3 F (36.8 C) 98.1 F (36.7 C) 97.6 F (36.4 C)   TempSrc: Oral  Oral   Resp: 20 18 20    Height:      Weight:      SpO2: 100% 100% 96%     Wt Readings from Last 3 Encounters:  04/23/12 62.596 kg (138 lb)  12/20/11 60.419 kg (133 lb 3.2 oz)    No intake or output data in the 24 hours ending 04/27/12 1029  Exam Awake Alert, Oriented X 3, No new F.N deficits, Normal affect [place].AT,PERRAL Supple Neck,No JVD, No cervical lymphadenopathy appriciated.  Symmetrical Chest wall movement, Good air movement bilaterally, CTAB RRR,No Gallops,Rubs or new Murmurs, No Parasternal Heave Hypoactive B.Sounds, Abd Soft, Non tender, No organomegaly appriciated, No rebound - guarding or rigidity. NG in place No Cyanosis, Clubbing or edema, No new Rash or bruise       Data Review   Micro Results Recent Results (from the past 240 hour(s))  URINE CULTURE     Status: Normal   Collection Time   04/23/12  5:57 PM      Component Value Range Status Comment   Specimen Description URINE, CLEAN CATCH   Final    Special Requests NONE   Final    Culture  Setup Time 04/24/2012 02:01   Final    Colony Count 40,000 COLONIES/ML   Final    Culture ENTEROBACTER CLOACAE   Final    Report Status 04/25/2012 FINAL   Final    Organism ID, Bacteria ENTEROBACTER CLOACAE   Final   URINE CULTURE     Status: Normal (Preliminary result)   Collection Time   04/25/12 11:01 AM      Component Value Range Status Comment   Specimen Description URINE, CLEAN CATCH   Final    Special Requests NONE   Final    Culture  Setup Time 04/25/2012 11:37   Final    Colony Count 35,000 COLONIES/ML   Final    Culture ENTEROBACTER CLOACAE   Final    Report Status PENDING   Incomplete     Radiology Reports Ct Abdomen Pelvis Wo Contrast  04/23/2012  *RADIOLOGY REPORT*  Clinical Data: Abdominopelvic pain, nausea/vomiting, history of renal transplant  CT ABDOMEN AND PELVIS WITHOUT CONTRAST  Technique:  Multidetector CT imaging of the abdomen and pelvis was performed following the standard protocol without intravenous contrast.  Comparison: 12/11/2010  Findings: Linear scarring at the lung bases with calcifications along the right hemidiaphragm.  Coronary atherosclerosis.  Stable 1.5 x 1.8 cm right retrocrural fluid density lesion (series 2/image 8), likely benign.  Stable probable cyst in the posterior segment right hepatic lobe (series 2/image 15).  Calcified splenic granulomata with stable faint capsular calcification.  Pancreas is atrophic.  Bilateral adrenal glands are unremarkable.  Status post cholecystectomy.  No intrahepatic or extrahepatic ductal dilatation.  Status post bilateral nephrectomy.  Right lower quadrant renal transplant without hydronephrosis.  Multiple dilated loops of small  bowel in the right mid/lower abdomen, measuring up to 5.0 cm (series 2/image 39).  The terminal ileum is decompressed (series 2/image 47). Colon is relatively decompressed.  While no definite focal transition is visualized, the appearance is worrisome for at least partial small bowel  obstruction.  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  Atherosclerotic calcifications of the abdominal aorta and branch vessels.  No abdominopelvic ascites.  No suspicious abdominopelvic lymphadenopathy.  Uterus is unremarkable.  No adnexal masses.  Bladder is within normal limits.  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  No focal osseous lesions.  IMPRESSION: Multiple dilated loops of small bowel in the right mid/lower abdomen, worrisome for at least partial small bowel obstruction.  Right lower quadrant renal transplant without hydronephrosis.  Status post bilateral nephrectomy.  Additional stable ancillary findings as above.   Original Report Authenticated By: Julian Hy, M.D.    Dg Chest 2 View  04/10/2012  *RADIOLOGY REPORT*  Clinical Data: Cough, congestion  CHEST - 2 VIEW  Comparison: Portable chest x-ray of 12/15/2010  Findings: No active infiltrate or effusion is seen.  Calcified pleural plaque at the right lung base is stable.  Mild cardiomegaly is stable.  No acute bony abnormality is seen with degenerative change in the mid thoracic spine again noted.  IMPRESSION: Stable chest x-ray.  Mild cardiomegaly.  No active lung disease.   Original Report Authenticated By: Ivar Drape, M.D.    Dg Abd Acute W/chest  04/23/2012  *RADIOLOGY REPORT*  Clinical Data: Abdominal pain, vomiting  ACUTE ABDOMEN SERIES (ABDOMEN 2 VIEW & CHEST 1 VIEW)  Comparison: Chest radiographs dated 04/10/2012.  CT abdomen pelvis dated 12/11/2010.  Findings: Chronic interstitial markings/bronchitic changes.  No focal consolidation. No pleural effusion or pneumothorax.  Cardiomediastinal silhouette is within normal limits.   Nonspecific bowel gas pattern without disproportionate small bowel dilatation to suggest small bowel obstruction. No evidence of free air under the diaphragm on the upright view.  Surgical clips in the bilateral abdomen.  IVC filter at L3-4.  IMPRESSION: No evidence of acute cardiopulmonary disease.  No evidence of small bowel obstruction or free air.   Original Report Authenticated By: Julian Hy, M.D.     CBC  Lab 04/27/12 0615 04/26/12 0542 04/25/12 0610 04/24/12 0624 04/23/12 1850  WBC 17.9* 19.5* 20.5* 14.6* 15.1*  HGB 10.4* 12.3 12.8 10.6* 11.4*  HCT 32.8* 38.3 40.3 34.1* 35.8*  PLT 180 151 192 167 177  MCV 93.2 91.0 93.1 92.7 90.9  MCH 29.5 29.2 29.6 28.8 28.9  MCHC 31.7 32.1 31.8 31.1 31.8  RDW 15.6* 15.9* 16.0* 15.7* 15.4  LYMPHSABS -- -- -- -- 1.0  MONOABS -- -- -- -- 1.1*  EOSABS -- -- -- -- 0.0  BASOSABS -- -- -- -- 0.0  BANDABS -- -- -- -- --    Chemistries   Lab 04/27/12 0615 04/26/12 0542 04/25/12 0610 04/24/12 0624 04/23/12 1850  NA 132* 131* 135 138 135  K 3.8 4.1 4.1 4.5 4.3  CL 98 96 96 104 99  CO2 23 19 23 23 22   GLUCOSE 95 88 79 107* 140*  BUN 21 20 19  24* 28*  CREATININE 1.40* 1.29* 1.48* 1.51* 1.70*  CALCIUM 8.9 9.1 9.5 9.0 9.9  MG 1.4* 1.5 1.5 -- --  AST -- -- -- -- 13  ALT -- -- -- -- 9  ALKPHOS -- -- -- -- 141*  BILITOT -- -- -- -- 0.4   ------------------------------------------------------------------------------------------------------------------ estimated creatinine clearance is 31.8 ml/min (by C-G formula based on Cr of 1.4). ------------------------------------------------------------------------------------------------------------------ No results found for this basename: HGBA1C:2 in the last 72 hours ------------------------------------------------------------------------------------------------------------------ No results found for this basename: CHOL:2,HDL:2,LDLCALC:2,TRIG:2,CHOLHDL:2,LDLDIRECT:2 in the last 72  hours ------------------------------------------------------------------------------------------------------------------ No results found for this basename: TSH,T4TOTAL,FREET3,T3FREE,THYROIDAB in the last 72 hours ------------------------------------------------------------------------------------------------------------------  No results found for this basename: VITAMINB12:2,FOLATE:2,FERRITIN:2,TIBC:2,IRON:2,RETICCTPCT:2 in the last 72 hours  Coagulation profile  Lab 04/27/12 0615 04/24/12 0624 04/23/12 1850  INR 1.84* 1.39 1.33  PROTIME -- -- --    No results found for this basename: DDIMER:2 in the last 72 hours  Cardiac Enzymes No results found for this basename: CK:3,CKMB:3,TROPONINI:3,MYOGLOBIN:3 in the last 168 hours ------------------------------------------------------------------------------------------------------------------ No components found with this basename: POCBNP:3

## 2012-04-27 NOTE — Progress Notes (Signed)
ANTICOAGULATION CONSULT NOTE - Follow Up Consult  Pharmacy Consult for Coumadin Indication: Atrial Fibrillation & History of DVT  Patient Measurements: Height: 5' 3"  (160 cm) Weight: 138 lb (62.596 kg) IBW/kg (Calculated) : 52.4   Vital Signs: Temp: 97.6 F (36.4 C) (12/20 0627) Temp src: Oral (12/20 0627) BP: 98/58 mmHg (12/20 0636) Pulse Rate: 62  (12/20 0627)  Labs:  Basename 04/27/12 0615 04/26/12 0542 04/25/12 0610  HGB 10.4* 12.3 --  HCT 32.8* 38.3 40.3  PLT 180 151 192  APTT -- -- --  LABPROT 20.6* -- --  INR 1.84* -- --  HEPARINUNFRC -- -- --  CREATININE 1.40* 1.29* 1.48*  CKTOTAL -- -- --  CKMB -- -- --  TROPONINI -- -- --   Estimated Creatinine Clearance: 31.8 ml/min (by C-G formula based on Cr of 1.4).   Assessment: 71 yo F admitted 12/16 with partial SBO. Anticoagulation was held at that time for possible need for surgical intervention --patient has improved without surgery and is currently tolerating PO meds and clear liquids. Patient has been restarted on Coumadin for history of AFib and DVT. Per patient, home dose prior to arrival was Coumadin 69m daily except only 2.527mMonday,Wedneday, Friday. INR is sub-therapuetic but close to goal at 1.84 (but large jump after 7.74m51m 1 dose last night). Hgb down today 10.4, but per Dr. SinCandiss Norse likely dilutional -- no overt bleeding noted by MD or RN.  Goal of Therapy:  INR 2-3 Monitor platelets by anticoagulation protocol: Yes  Plan:  1) Coumadin 2 mg PO x 1 tonight 2) F/u INR in AM 3) F/u signs/symptoms bleeding   KriWoodroe ChenharmD    04/27/2012   12:20 PM

## 2012-04-27 NOTE — Progress Notes (Signed)
Patient interviewed and examined, agree with NP note above. SBO appears resolved, will sign off.  Edward Jolly MD, FACS  04/27/2012 2:51 PM

## 2012-04-28 MED ORDER — WARFARIN SODIUM 5 MG PO TABS
5.0000 mg | ORAL_TABLET | Freq: Once | ORAL | Status: DC
  Filled 2012-04-28: qty 1

## 2012-04-28 NOTE — Discharge Summary (Signed)
[place], is a 71 y.o. female  DOB [birth year]  MRN [id number].  Admission date:  04/23/2012  Discharge Date:  04/28/2012  Primary MD  DETERDING,[name], MD  Admitting Physician  [name], MD  Admission Diagnosis  Renal insufficiency [593.9] Partial small bowel obstruction [560.9] Abdominal pain [789.00] Severe ABD Pain  Discharge Diagnosis     Principal Problem:  *Small bowel obstruction, partial Active Problems:  DVT of leg (deep venous thrombosis)  Atrial fibrillation  CKD (chronic kidney disease), stage III  History of renal transplant  HTN (hypertension)  Crohn's disease    Past Medical History  Diagnosis Date  . Crohn's disease   . Hypertension   . Refusal of blood transfusion for reasons of conscience 12/20/2011    pt is Jehov[employer]'s Witness  . Atrial fibrillation   . DVT of leg (deep venous thrombosis) 2012-8./13/2013    "have had one in each leg now"  . Numbness and tingling of foot     bilaterally  . GERD (gastroesophageal reflux disease)   . Hypothyroidism   . Lower GI bleed 2012  . DVT of lower extremity, bilateral     "have had them in both legs; last one was on the RLE this year" (04/25/2012)  . OSA on CPAP   . Anemia     "when lupus flares up"  . SLE (systemic lupus erythematosus)   . Arthritis     "in my joints"  . Renal disorder     S/P nephrectomy; dialysis; "working fine now" (12/20/11)  . Renal cell carcinoma     Past Surgical History  Procedure Date  . Cesarean section 1984  . Nephrectomy transplanted organ 2006    bilaterally  . Colectomy 1998    "removal of abscess" (04/25/2012)  . Excisional hemorrhoidectomy   . Cataract extraction w/ intraocular lens  implant, bilateral 2005-2010  . Vena cava filter placement 2012  . Insertion of dialysis catheter 1988-2006    "peritoneal and hemodialysis; had  multiple grafts and fistulas; last graft clotted off 2012"  . Cholecystectomy 1995  . Colostomy 1998  . Colostomy reversal 1999    "6 months after placed" (04/25/2012)  . Bone marrow biopsy 1993; 1995  . Abdominal exploration surgery 1995    "found sluggish bone marrow" (04/25/2012)     Recommendations for primary care physician for things to follow:   Please monitor CBC BMP and INR in 2-3 days.   Discharge Diagnoses:   Principal Problem:  *Small bowel obstruction, partial Active Problems:  DVT of leg (deep venous thrombosis)  Atrial fibrillation  CKD (chronic kidney disease), stage III  History of renal transplant  HTN (hypertension)  Crohn's disease    Discharge Condition: stable   Diet recommendation: See Discharge Instructions below   Consults Gen Surgery, Dr. Drucilla Schmidt ID over the phone   History of present illness and  Hospital Course:  See H&P, Labs, Consult and Test reports for all details in brief, patient was admitted for partial  small bowel obstruction, she has history of multiple GI surgeries in the past with Crohn's disease, she was treated with conservative measures, NG tube for the first 24 hours along with bowel rest, she was seen by general surgery, she showed gradual improvement now having bowel movements tolerating regular diet without any discomfort, NG tube has been removed 36 hours ago, discussed with general surgeon Dr. Mikael Spray she is okay for discharge.    She also has history of renal transplant long with chronic kidney disease stage 3-4 baseline creatinine around 1.5, home medications will be continued her creatinine is at baseline.   She has history of atrial fibrillation and is on Coumadin, her home medications will be continued unchanged. Last INR is 2.11.   She stable from a Crohn's disease 10 point outpatient GI followup in 2-3 weeks.   She has mild leukocytosis of unclear radiology, afebrile, leukocytosis likely due to small bowel  obstruction and stress induced from that, she remained afebrile, urine cultures showed  35,000 colonies of Enterobacter, she had no dysuria case was discussed over the phone with Dr. Drucilla Schmidt infectious disease no antibiotics were recommended at this time, I concur patient is symptom free, we'll request PCP to keep a close eye on her CBC. And monitor for clinical signs of infection. She is stable chest x-ray.    Today   Subjective:   [name] today has no headache,no chest abdominal pain,no new weakness tingling or numbness, feels much better wants to go home today.    Objective:   Blood pressure 105/50, pulse 87, temperature 98.7 F (37.1 C), temperature source Oral, resp. rate 18, height 5' 3"  (1.6 m), weight 62.596 kg (138 lb), SpO2 97.00%.   Intake/Output Summary (Last 24 hours) at 04/28/12 1024 Last data filed at 04/28/12 0500  Gross per 24 hour  Intake      0 ml  Output      2 ml  Net     -2 ml    Exam Awake Alert, Oriented *3, No new F.N deficits, Normal affect Section.AT,PERRAL Supple Neck,No JVD, No cervical lymphadenopathy appriciated.  Symmetrical Chest wall movement, Good air movement bilaterally, CTAB RRR,No Gallops,Rubs or new Murmurs, No Parasternal Heave +ve B.Sounds, Abd Soft, Non tender, No organomegaly appriciated, No rebound -guarding or rigidity. No Cyanosis, Clubbing or edema, No new Rash or bruise  Data Review   Major procedures and Radiology Reports - PLEASE review detailed and final reports for all details in brief -       Ct Abdomen Pelvis Wo Contrast  04/23/2012  *RADIOLOGY REPORT*  Clinical Data: Abdominopelvic pain, nausea/vomiting, history of renal transplant  CT ABDOMEN AND PELVIS WITHOUT CONTRAST  Technique:  Multidetector CT imaging of the abdomen and pelvis was performed following the standard protocol without intravenous contrast.  Comparison: 12/11/2010  Findings: Linear scarring at the lung bases with calcifications along the right  hemidiaphragm.  Coronary atherosclerosis.  Stable 1.5 x 1.8 cm right retrocrural fluid density lesion (series 2/image 8), likely benign.  Stable probable cyst in the posterior segment right hepatic lobe (series 2/image 15).  Calcified splenic granulomata with stable faint capsular calcification.  Pancreas is atrophic.  Bilateral adrenal glands are unremarkable.  Status post cholecystectomy.  No intrahepatic or extrahepatic ductal dilatation.  Status post bilateral nephrectomy.  Right lower quadrant renal transplant without hydronephrosis.  Multiple dilated loops of small bowel in the right mid/lower abdomen, measuring up to 5.0 cm (series 2/image 39).  The terminal ileum is decompressed (series  2/image 47). Colon is relatively decompressed.  While no definite focal transition is visualized, the appearance is worrisome for at least partial small bowel obstruction.  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  Atherosclerotic calcifications of the abdominal aorta and branch vessels.  No abdominopelvic ascites.  No suspicious abdominopelvic lymphadenopathy.  Uterus is unremarkable.  No adnexal masses.  Bladder is within normal limits.  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  No focal osseous lesions.  IMPRESSION: Multiple dilated loops of small bowel in the right mid/lower abdomen, worrisome for at least partial small bowel obstruction.  Right lower quadrant renal transplant without hydronephrosis.  Status post bilateral nephrectomy.  Additional stable ancillary findings as above.   Original Report Authenticated By: Julian Hy, M.D.    Dg Chest 2 View  04/25/2012  *RADIOLOGY REPORT*  Clinical Data: Cough  CHEST - 2 VIEW  Comparison: Chest x-ray of 04/23/2012  Findings: There is bibasilar linear atelectasis or scarring present.  No definite infiltrate or effusion is seen.  Mild cardiomegaly is stable.  An NG tube is present with the tip in the region of the body of the stomach.  The bones  are osteopenic but no acute bony abnormality is seen.  Multiple surgical clips are present within the upper abdomen.  IMPRESSION: Bibasilar linear atelectasis or scarring.  Stable cardiomegaly.   Original Report Authenticated By: Ivar Drape, M.D.    Dg Chest 2 View  04/10/2012  *RADIOLOGY REPORT*  Clinical Data: Cough, congestion  CHEST - 2 VIEW  Comparison: Portable chest x-ray of 12/15/2010  Findings: No active infiltrate or effusion is seen.  Calcified pleural plaque at the right lung base is stable.  Mild cardiomegaly is stable.  No acute bony abnormality is seen with degenerative change in the mid thoracic spine again noted.  IMPRESSION: Stable chest x-ray.  Mild cardiomegaly.  No active lung disease.   Original Report Authenticated By: Ivar Drape, M.D.    Dg Abd 2 Views  04/25/2012  *RADIOLOGY REPORT*  Clinical Data: Weakness.  Aches all over.  Small bowel obstruction.  ABDOMEN - 2 VIEW  Comparison: 04/24/2012  Findings: Upright film shows no evidence for intraperitoneal free air. NG tube tip is in the distal stomach.  IVC filter again noted. Numerous surgical clips are seen in the central abdomen.  No gaseous bowel dilatation on the current study.  Scattered bowel gas seen mainly and colonic loops.  IMPRESSION: No evidence for bowel perforation.  The bowel gas pattern is nonobstructive on the current study.   Original Report Authenticated By: Misty Stanley, M.D.    Dg Abd 2 Views  04/24/2012  *RADIOLOGY REPORT*  Clinical Data: Small bowel obstruction.  ABDOMEN - 2 VIEW  Comparison: CT abdomen and pelvis 04/23/2012.  Findings: NG tube is in place.  Multiple surgical clips and an IVC filter are again seen.  Short air fluid levels in small bowel persist.  Contrast material from the patient's CT scan are seen in distal small bowel loops.  Small bowel loops are distended up to 5.1 cm, not markedly changed.  IMPRESSION: No marked change in small bowel obstruction.   Original Report Authenticated By:  Orlean Patten, M.D.    Dg Abd Acute W/chest  04/23/2012  *RADIOLOGY REPORT*  Clinical Data: Abdominal pain, vomiting  ACUTE ABDOMEN SERIES (ABDOMEN 2 VIEW & CHEST 1 VIEW)  Comparison: Chest radiographs dated 04/10/2012.  CT abdomen pelvis dated 12/11/2010.  Findings: Chronic interstitial markings/bronchitic changes.  No focal consolidation. No pleural effusion or pneumothorax.  Cardiomediastinal silhouette is within normal limits.  Nonspecific bowel gas pattern without disproportionate small bowel dilatation to suggest small bowel obstruction. No evidence of free air under the diaphragm on the upright view.  Surgical clips in the bilateral abdomen.  IVC filter at L3-4.  IMPRESSION: No evidence of acute cardiopulmonary disease.  No evidence of small bowel obstruction or free air.   Original Report Authenticated By: Julian Hy, M.D.     Micro Results      Recent Results (from the past 240 hour(s))  URINE CULTURE     Status: Normal   Collection Time   04/23/12  5:57 PM      Component Value Range Status Comment   Specimen Description URINE, CLEAN CATCH   Final    Special Requests NONE   Final    Culture  Setup Time 04/24/2012 02:01   Final    Colony Count 40,000 COLONIES/ML   Final    Culture ENTEROBACTER CLOACAE   Final    Report Status 04/25/2012 FINAL   Final    Organism ID, Bacteria ENTEROBACTER CLOACAE   Final   URINE CULTURE     Status: Normal (Preliminary result)   Collection Time   04/25/12 11:01 AM      Component Value Range Status Comment   Specimen Description URINE, CLEAN CATCH   Final    Special Requests NONE   Final    Culture  Setup Time 04/25/2012 11:37   Final    Colony Count 35,000 COLONIES/ML   Final    Culture ENTEROBACTER CLOACAE   Final    Report Status PENDING   Incomplete      CBC w Diff: Lab Results  Component Value Date   WBC 17.9* 04/27/2012   HGB 10.4* 04/27/2012   HCT 32.8* 04/27/2012   PLT 180 04/27/2012   LYMPHOPCT 6* 04/23/2012   MONOPCT  7 04/23/2012   EOSPCT 0 04/23/2012   BASOPCT 0 04/23/2012    CMP: Lab Results  Component Value Date   NA 132* 04/27/2012   K 3.8 04/27/2012   CL 98 04/27/2012   CO2 23 04/27/2012   BUN 21 04/27/2012   CREATININE 1.40* 04/27/2012   PROT 7.6 04/23/2012   ALBUMIN 4.2 04/23/2012   BILITOT 0.4 04/23/2012   ALKPHOS 141* 04/23/2012   AST 13 04/23/2012   ALT 9 04/23/2012  . Lab Results  Component Value Date   INR 2.11* 04/28/2012   INR 1.84* 04/27/2012   INR 1.39 04/24/2012     Discharge Instructions     Follow with Primary MD DETERDING,[name], MD in 2 days   Get CBC, CMP, INR checked 2 days by Primary MD and again as instructed by your Primary MD.   Get Medicines reviewed and adjusted.  Please request your Prim.MD to go over all Hospital Tests and Procedure/Radiological results at the follow up, please get all Hospital records sent to your Prim MD by signing hospital release before you go home.  Activity: As tolerated with Full fall precautions use walker/cane & assistance as needed   Diet: Heart healthy  For Heart failure patients - Check your Weight same time everyday, if you gain over 2 pounds, or you develop in leg swelling, experience more shortness of breath or chest pain, call your Primary MD immediately. Follow Cardiac Low Salt Diet and 1.8 lit/day fluid restriction.  Disposition Home   If you experience worsening of your admission symptoms, develop shortness of breath, life threatening emergency, suicidal or homicidal  thoughts you must seek medical attention immediately by calling 911 or calling your MD immediately  if symptoms less severe.  You Must read complete instructions/literature along with all the possible adverse reactions/side effects for all the Medicines you take and that have been prescribed to you. Take any new Medicines after you have completely understood and accpet all the possible adverse reactions/side effects.   Do not drive and provide baby  sitting services if your were admitted for syncope or siezures until you have seen by Primary MD or a Neurologist and advised to do so again.  Do not drive when taking Pain medications.    Do not take more than prescribed Pain, Sleep and Anxiety Medications  Special Instructions: If you have smoked or chewed Tobacco  in the last 2 yrs please stop smoking, stop any regular Alcohol  and or any Recreational drug use.  Wear Seat belts while driving.  Follow-up Information    Call [facility], [place]. (You may call to set an appointment when you are ready to have your hernia repaired.  Also you can call as needed if symptoms worsen)    Contact information:   [place] [phone]      Follow up with DETERDING,[name], MD. Schedule an appointment as soon as possible for a visit in 2 days.   Contact information:   [place] [phone]            Discharge Medications     Medication List     As of 04/28/2012 10:24 AM    CONTINUE taking these medications         amiodarone 200 MG tablet   Commonly known as: PACERONE      calcitRIOL 0.25 MCG capsule   Commonly known as: ROCALTROL      epoetin alfa 40000 UNIT/ML injection   Commonly known as: EPOGEN,PROCRIT      gabapentin 100 MG capsule   Commonly known as: NEURONTIN      levothyroxine 100 MCG tablet   Commonly known as: SYNTHROID, LEVOTHROID      metoprolol 100 MG tablet   Commonly known as: LOPRESSOR      mycophenolate 180 MG EC tablet   Commonly known as: MYFORTIC      NIFEdipine 60 MG 24 hr tablet   Commonly known as: PROCARDIA XL/ADALAT-CC      omeprazole 20 MG capsule   Commonly known as: PRILOSEC      polyethylene glycol packet   Commonly known as: MIRALAX / GLYCOLAX      predniSONE 5 MG tablet   Commonly known as: DELTASONE      * tacrolimus 5 MG capsule   Commonly known as:  PROGRAF      * tacrolimus 1 MG capsule   Commonly known as: PROGRAF      warfarin 5 MG tablet   Commonly known as: COUMADIN     * Notice: This list has 2 medication(s) that are the same as other medications prescribed for you. Read the directions carefully, and ask your doctor or other care provider to review them with you.         Total Time in preparing paper work, data evaluation and todays exam - 35 minutes  Thurnell Lose M.D on 04/28/2012 at 10:24 AM  [facility] Office  [phone]

## 2012-04-28 NOTE — Progress Notes (Signed)
ANTICOAGULATION CONSULT NOTE - Follow Up Consult  Pharmacy Consult for Coumadin Indication: Atrial Fibrillation & History of DVT  Allergies  Allergen Reactions  . Amoxicillin Anaphylaxis  . Ampicillin Anaphylaxis  . Ciprofloxacin Swelling    "of my throat"  . Erythromycin Anaphylaxis  . Penicillins Anaphylaxis  . Sulfa Antibiotics Itching  . Sulfamethoxazole W-Trimethoprim Itching  . Tetracycline Anaphylaxis  . Labetalol Nausea And Vomiting    Patient Measurements: Height: 5' 3"  (160 cm) Weight: 138 lb (62.596 kg) IBW/kg (Calculated) : 52.4   Vital Signs: Temp: 98.7 F (37.1 C) (12/21 0550) Temp src: Oral (12/21 0550) BP: 105/50 mmHg (12/21 0550) Pulse Rate: 87  (12/21 0550)  Labs:  Basename 04/28/12 0450 04/27/12 0615 04/26/12 0542  HGB -- 10.4* 12.3  HCT -- 32.8* 38.3  PLT -- 180 151  APTT -- -- --  LABPROT 22.8* 20.6* --  INR 2.11* 1.84* --  HEPARINUNFRC -- -- --  CREATININE -- 1.40* 1.29*  CKTOTAL -- -- --  CKMB -- -- --  TROPONINI -- -- --    Estimated Creatinine Clearance: 31.8 ml/min (by C-G formula based on Cr of 1.4).   Medications:  Scheduled:    . amiodarone  100 mg Oral BID  . [COMPLETED] magnesium sulfate 1 - 4 g bolus IVPB  2 g Intravenous Once  . metoprolol  100 mg Oral BID  . mycophenolate  360 mg Oral BID  . NIFEdipine  60 mg Oral Daily  . predniSONE  5 mg Oral Q breakfast  . [COMPLETED] tacrolimus  1 mg Oral NOW  . tacrolimus  6 mg Oral Q24H  . tacrolimus  7 mg Oral Q24H  . [COMPLETED] warfarin  2 mg Oral ONCE-1800  . Warfarin - Pharmacist Dosing Inpatient   Does not apply q1800  . [DISCONTINUED] tacrolimus  6 mg Oral BID    Assessment: 71 yo F admitted 12/16 with partial SBO. Anticoagulation was held at that time for possible need for surgical intervention --patient has improved without surgery and is currently tolerating PO meds and clear liquids. Patient has been restarted on Coumadin for history of AFib and DVT.   Per  patient, home dose prior to arrival was Coumadin 32m daily except only 2.556mMonday,Wedneday, Friday. INR is therapuetic today at 2.11. Will give 68m23monight which would be consistent with the patient's home dose. Noted patient is on amiodarone, which patient was also on PTA. Hgb down yesterday to 10.4, but per Dr. SinCandiss Norse likely dilutional -- no new labs today. No overt bleeding noted. Patient was re-educated about Coumadin yesterday.   Goal of Therapy:  INR 2-3 Monitor platelets by anticoagulation protocol: Yes   Plan:  1) Coumadin 5 mg PO x 1 tonight 2) F/u INR in AM 3) F/u signs/symptoms bleeding   SteDicky DoeharmD Clinical Pharmacist Pager: [phone]: [phone]/21/2013 8:05 AM

## 2012-04-29 LAB — URINE CULTURE

## 2012-05-14 DIAGNOSIS — Z79899 Other long term (current) drug therapy: Secondary | ICD-10-CM | POA: Diagnosis not present

## 2012-05-14 DIAGNOSIS — D649 Anemia, unspecified: Secondary | ICD-10-CM | POA: Diagnosis not present

## 2012-05-14 DIAGNOSIS — I10 Essential (primary) hypertension: Secondary | ICD-10-CM | POA: Diagnosis not present

## 2012-05-14 DIAGNOSIS — E213 Hyperparathyroidism, unspecified: Secondary | ICD-10-CM | POA: Diagnosis not present

## 2012-05-14 DIAGNOSIS — N2581 Secondary hyperparathyroidism of renal origin: Secondary | ICD-10-CM | POA: Diagnosis not present

## 2012-05-14 DIAGNOSIS — Z940 Kidney transplant status: Secondary | ICD-10-CM | POA: Diagnosis not present

## 2012-05-14 DIAGNOSIS — E039 Hypothyroidism, unspecified: Secondary | ICD-10-CM | POA: Diagnosis not present

## 2012-05-14 DIAGNOSIS — Z23 Encounter for immunization: Secondary | ICD-10-CM | POA: Diagnosis not present

## 2012-05-15 DIAGNOSIS — Z79899 Other long term (current) drug therapy: Secondary | ICD-10-CM | POA: Diagnosis not present

## 2012-05-15 DIAGNOSIS — E213 Hyperparathyroidism, unspecified: Secondary | ICD-10-CM | POA: Diagnosis not present

## 2012-05-15 DIAGNOSIS — D649 Anemia, unspecified: Secondary | ICD-10-CM | POA: Diagnosis not present

## 2012-05-15 DIAGNOSIS — N390 Urinary tract infection, site not specified: Secondary | ICD-10-CM | POA: Diagnosis not present

## 2012-05-15 DIAGNOSIS — E039 Hypothyroidism, unspecified: Secondary | ICD-10-CM | POA: Diagnosis not present

## 2012-05-15 DIAGNOSIS — Z940 Kidney transplant status: Secondary | ICD-10-CM | POA: Diagnosis not present

## 2012-05-17 ENCOUNTER — Encounter (HOSPITAL_COMMUNITY): Payer: Medicare Other

## 2012-05-28 DIAGNOSIS — Z7901 Long term (current) use of anticoagulants: Secondary | ICD-10-CM | POA: Diagnosis not present

## 2012-05-28 DIAGNOSIS — Z940 Kidney transplant status: Secondary | ICD-10-CM | POA: Diagnosis not present

## 2012-05-29 DIAGNOSIS — Z940 Kidney transplant status: Secondary | ICD-10-CM | POA: Diagnosis not present

## 2012-06-07 DIAGNOSIS — K5090 Crohn's disease, unspecified, without complications: Secondary | ICD-10-CM | POA: Diagnosis not present

## 2012-06-07 DIAGNOSIS — R197 Diarrhea, unspecified: Secondary | ICD-10-CM | POA: Diagnosis not present

## 2012-06-07 DIAGNOSIS — K624 Stenosis of anus and rectum: Secondary | ICD-10-CM | POA: Diagnosis not present

## 2012-06-12 DIAGNOSIS — Z940 Kidney transplant status: Secondary | ICD-10-CM | POA: Diagnosis not present

## 2012-06-12 DIAGNOSIS — E213 Hyperparathyroidism, unspecified: Secondary | ICD-10-CM | POA: Diagnosis not present

## 2012-06-19 DIAGNOSIS — Z79899 Other long term (current) drug therapy: Secondary | ICD-10-CM | POA: Diagnosis not present

## 2012-06-19 DIAGNOSIS — I059 Rheumatic mitral valve disease, unspecified: Secondary | ICD-10-CM | POA: Diagnosis not present

## 2012-06-19 DIAGNOSIS — I369 Nonrheumatic tricuspid valve disorder, unspecified: Secondary | ICD-10-CM | POA: Diagnosis not present

## 2012-06-19 DIAGNOSIS — I517 Cardiomegaly: Secondary | ICD-10-CM | POA: Diagnosis not present

## 2012-06-19 DIAGNOSIS — R0602 Shortness of breath: Secondary | ICD-10-CM | POA: Diagnosis not present

## 2012-06-19 DIAGNOSIS — I4891 Unspecified atrial fibrillation: Secondary | ICD-10-CM | POA: Diagnosis not present

## 2012-06-19 DIAGNOSIS — I359 Nonrheumatic aortic valve disorder, unspecified: Secondary | ICD-10-CM | POA: Diagnosis not present

## 2012-06-27 DIAGNOSIS — I749 Embolism and thrombosis of unspecified artery: Secondary | ICD-10-CM | POA: Diagnosis not present

## 2012-07-02 DIAGNOSIS — K624 Stenosis of anus and rectum: Secondary | ICD-10-CM | POA: Diagnosis not present

## 2012-07-02 DIAGNOSIS — K5090 Crohn's disease, unspecified, without complications: Secondary | ICD-10-CM | POA: Diagnosis not present

## 2012-07-25 DIAGNOSIS — I749 Embolism and thrombosis of unspecified artery: Secondary | ICD-10-CM | POA: Diagnosis not present

## 2012-08-09 DIAGNOSIS — E785 Hyperlipidemia, unspecified: Secondary | ICD-10-CM | POA: Diagnosis not present

## 2012-08-09 DIAGNOSIS — E039 Hypothyroidism, unspecified: Secondary | ICD-10-CM | POA: Diagnosis not present

## 2012-08-09 DIAGNOSIS — Z940 Kidney transplant status: Secondary | ICD-10-CM | POA: Diagnosis not present

## 2012-08-09 DIAGNOSIS — E213 Hyperparathyroidism, unspecified: Secondary | ICD-10-CM | POA: Diagnosis not present

## 2012-08-09 DIAGNOSIS — I10 Essential (primary) hypertension: Secondary | ICD-10-CM | POA: Diagnosis not present

## 2012-08-09 DIAGNOSIS — D649 Anemia, unspecified: Secondary | ICD-10-CM | POA: Diagnosis not present

## 2012-08-09 DIAGNOSIS — N2581 Secondary hyperparathyroidism of renal origin: Secondary | ICD-10-CM | POA: Diagnosis not present

## 2012-08-22 DIAGNOSIS — I749 Embolism and thrombosis of unspecified artery: Secondary | ICD-10-CM | POA: Diagnosis not present

## 2012-08-29 ENCOUNTER — Other Ambulatory Visit (HOSPITAL_COMMUNITY): Payer: Self-pay | Admitting: *Deleted

## 2012-08-30 ENCOUNTER — Encounter (HOSPITAL_COMMUNITY)
Admission: RE | Admit: 2012-08-30 | Discharge: 2012-08-30 | Disposition: A | Discharge status: Home / Self Care | Payer: Medicare Other | Source: Ambulatory Visit | Attending: Nephrology | Admitting: Nephrology

## 2012-08-30 DIAGNOSIS — D649 Anemia, unspecified: Secondary | ICD-10-CM | POA: Insufficient documentation

## 2012-08-30 LAB — POCT HEMOGLOBIN-HEMACUE: Hemoglobin: 10.2 g/dL — ABNORMAL LOW (ref 12.0–15.0)

## 2012-08-30 MED ORDER — EPOETIN ALFA 10000 UNIT/ML IJ SOLN: 40000.0000 [IU] | INTRAMUSCULAR | Status: DC

## 2012-08-30 MED ORDER — EPOETIN ALFA 40000 UNIT/ML IJ SOLN
INTRAMUSCULAR | Status: AC
  Administered 2012-08-30: 40000 [IU] via SUBCUTANEOUS
  Filled 2012-08-30: qty 1

## 2012-09-26 ENCOUNTER — Other Ambulatory Visit (HOSPITAL_COMMUNITY): Payer: Self-pay | Admitting: *Deleted

## 2012-09-27 ENCOUNTER — Encounter (HOSPITAL_COMMUNITY)
Admission: RE | Admit: 2012-09-27 | Discharge: 2012-09-27 | Disposition: A | Discharge status: Home / Self Care | Payer: Medicare Other | Source: Ambulatory Visit | Attending: Nephrology | Admitting: Nephrology

## 2012-09-27 DIAGNOSIS — D649 Anemia, unspecified: Secondary | ICD-10-CM | POA: Diagnosis not present

## 2012-09-27 LAB — RENAL FUNCTION PANEL
CO2: 23 mEq/L (ref 19–32)
Chloride: 102 mEq/L (ref 96–112)
Creatinine, Ser: 2.2 mg/dL — ABNORMAL HIGH (ref 0.50–1.10)
GFR calc non Af Amer: 22 mL/min — ABNORMAL LOW (ref >90)

## 2012-09-27 LAB — POCT HEMOGLOBIN-HEMACUE: Hemoglobin: 10.7 g/dL — ABNORMAL LOW (ref 12.0–15.0)

## 2012-09-27 MED ORDER — EPOETIN ALFA 10000 UNIT/ML IJ SOLN: 40000.0000 [IU] | INTRAMUSCULAR | Status: DC

## 2012-09-27 MED ORDER — EPOETIN ALFA 40000 UNIT/ML IJ SOLN
INTRAMUSCULAR | Status: AC
  Administered 2012-09-27: 40000 [IU] via SUBCUTANEOUS
  Filled 2012-09-27: qty 1

## 2012-09-28 LAB — IRON AND TIBC
Saturation Ratios: 36 % (ref 20–55)
TIBC: 194 ug/dL — ABNORMAL LOW (ref 250–470)

## 2012-10-05 DIAGNOSIS — I10 Essential (primary) hypertension: Secondary | ICD-10-CM | POA: Diagnosis not present

## 2012-10-05 DIAGNOSIS — Z940 Kidney transplant status: Secondary | ICD-10-CM | POA: Diagnosis not present

## 2012-10-25 ENCOUNTER — Encounter (HOSPITAL_COMMUNITY)
Admission: RE | Admit: 2012-10-25 | Discharge: 2012-10-25 | Disposition: A | Discharge status: Home / Self Care | Payer: Medicare Other | Source: Ambulatory Visit | Attending: Nephrology | Admitting: Nephrology

## 2012-10-25 ENCOUNTER — Encounter (HOSPITAL_COMMUNITY): Payer: Medicare Other

## 2012-10-25 DIAGNOSIS — N189 Chronic kidney disease, unspecified: Secondary | ICD-10-CM | POA: Diagnosis not present

## 2012-10-25 DIAGNOSIS — D649 Anemia, unspecified: Secondary | ICD-10-CM | POA: Insufficient documentation

## 2012-10-25 DIAGNOSIS — K861 Other chronic pancreatitis: Secondary | ICD-10-CM | POA: Diagnosis not present

## 2012-10-25 DIAGNOSIS — K5090 Crohn's disease, unspecified, without complications: Secondary | ICD-10-CM | POA: Diagnosis not present

## 2012-10-25 DIAGNOSIS — T8610 Unspecified complication of kidney transplant: Secondary | ICD-10-CM | POA: Diagnosis not present

## 2012-10-25 DIAGNOSIS — K624 Stenosis of anus and rectum: Secondary | ICD-10-CM | POA: Diagnosis not present

## 2012-10-25 LAB — RENAL FUNCTION PANEL
Albumin: 4.3 g/dL (ref 3.5–5.2)
CO2: 24 mEq/L (ref 19–32)
Chloride: 100 mEq/L (ref 96–112)
GFR calc Af Amer: 23 mL/min — ABNORMAL LOW (ref >90)
GFR calc non Af Amer: 20 mL/min — ABNORMAL LOW (ref >90)
Sodium: 136 mEq/L (ref 135–145)

## 2012-10-25 LAB — IRON AND TIBC
Saturation Ratios: 29 % (ref 20–55)
TIBC: 238 ug/dL — ABNORMAL LOW (ref 250–470)

## 2012-10-25 MED ORDER — EPOETIN ALFA 10000 UNIT/ML IJ SOLN: 40000.0000 [IU] | INTRAMUSCULAR | Status: DC

## 2012-10-31 DIAGNOSIS — Z940 Kidney transplant status: Secondary | ICD-10-CM | POA: Diagnosis not present

## 2012-10-31 DIAGNOSIS — N390 Urinary tract infection, site not specified: Secondary | ICD-10-CM | POA: Diagnosis not present

## 2012-11-01 DIAGNOSIS — I120 Hypertensive chronic kidney disease with stage 5 chronic kidney disease or end stage renal disease: Secondary | ICD-10-CM | POA: Diagnosis not present

## 2012-11-07 ENCOUNTER — Other Ambulatory Visit (HOSPITAL_COMMUNITY): Payer: Self-pay

## 2012-11-08 ENCOUNTER — Encounter (HOSPITAL_COMMUNITY)
Admission: RE | Admit: 2012-11-08 | Discharge: 2012-11-08 | Disposition: A | Discharge status: Home / Self Care | Payer: Medicare Other | Source: Ambulatory Visit | Attending: Nephrology | Admitting: Nephrology

## 2012-11-08 DIAGNOSIS — D649 Anemia, unspecified: Secondary | ICD-10-CM | POA: Insufficient documentation

## 2012-11-08 LAB — RENAL FUNCTION PANEL
Albumin: 3.7 g/dL (ref 3.5–5.2)
BUN: 44 mg/dL — ABNORMAL HIGH (ref 6–23)
CO2: 22 mEq/L (ref 19–32)
Chloride: 104 mEq/L (ref 96–112)
Creatinine, Ser: 2.38 mg/dL — ABNORMAL HIGH (ref 0.50–1.10)
GFR calc non Af Amer: 20 mL/min — ABNORMAL LOW (ref >90)
Potassium: 3.9 mEq/L (ref 3.5–5.1)

## 2012-11-08 LAB — POCT HEMOGLOBIN-HEMACUE: Hemoglobin: 10.4 g/dL — ABNORMAL LOW (ref 12.0–15.0)

## 2012-11-08 LAB — PROTIME-INR: INR: 1.69 — ABNORMAL HIGH (ref 0.00–1.49)

## 2012-11-08 MED ORDER — EPOETIN ALFA 40000 UNIT/ML IJ SOLN
INTRAMUSCULAR | Status: AC
  Administered 2012-11-08: 40000 [IU] via SUBCUTANEOUS
  Filled 2012-11-08: qty 1

## 2012-11-08 MED ORDER — EPOETIN ALFA 10000 UNIT/ML IJ SOLN: 40000.0000 [IU] | INTRAMUSCULAR | Status: DC

## 2012-11-13 DIAGNOSIS — Z940 Kidney transplant status: Secondary | ICD-10-CM | POA: Diagnosis not present

## 2012-12-06 ENCOUNTER — Encounter (HOSPITAL_COMMUNITY): Payer: Medicare Other

## 2012-12-12 DIAGNOSIS — N2581 Secondary hyperparathyroidism of renal origin: Secondary | ICD-10-CM | POA: Diagnosis not present

## 2012-12-12 DIAGNOSIS — Z940 Kidney transplant status: Secondary | ICD-10-CM | POA: Diagnosis not present

## 2012-12-12 DIAGNOSIS — I749 Embolism and thrombosis of unspecified artery: Secondary | ICD-10-CM | POA: Diagnosis not present

## 2012-12-12 DIAGNOSIS — D649 Anemia, unspecified: Secondary | ICD-10-CM | POA: Diagnosis not present

## 2012-12-12 DIAGNOSIS — I120 Hypertensive chronic kidney disease with stage 5 chronic kidney disease or end stage renal disease: Secondary | ICD-10-CM | POA: Diagnosis not present

## 2012-12-13 DIAGNOSIS — I120 Hypertensive chronic kidney disease with stage 5 chronic kidney disease or end stage renal disease: Secondary | ICD-10-CM | POA: Diagnosis not present

## 2012-12-13 DIAGNOSIS — I749 Embolism and thrombosis of unspecified artery: Secondary | ICD-10-CM | POA: Diagnosis not present

## 2012-12-13 DIAGNOSIS — Z940 Kidney transplant status: Secondary | ICD-10-CM | POA: Diagnosis not present

## 2012-12-18 DIAGNOSIS — H26499 Other secondary cataract, unspecified eye: Secondary | ICD-10-CM | POA: Diagnosis not present

## 2012-12-19 DIAGNOSIS — N390 Urinary tract infection, site not specified: Secondary | ICD-10-CM | POA: Diagnosis not present

## 2012-12-25 ENCOUNTER — Telehealth: Payer: Self-pay | Admitting: Cardiovascular Disease

## 2012-12-25 NOTE — Telephone Encounter (Signed)
Rec'd from [facility] forward 24 pages to [place]

## 2012-12-26 DIAGNOSIS — L03039 Cellulitis of unspecified toe: Secondary | ICD-10-CM | POA: Diagnosis not present

## 2012-12-27 ENCOUNTER — Encounter (HOSPITAL_COMMUNITY): Admission: RE | Admit: 2012-12-27 | Payer: Medicare Other | Source: Ambulatory Visit

## 2013-01-02 ENCOUNTER — Other Ambulatory Visit (HOSPITAL_COMMUNITY): Payer: Self-pay | Admitting: *Deleted

## 2013-01-02 DIAGNOSIS — L03039 Cellulitis of unspecified toe: Secondary | ICD-10-CM | POA: Diagnosis not present

## 2013-01-03 ENCOUNTER — Encounter (HOSPITAL_COMMUNITY)
Admission: RE | Admit: 2013-01-03 | Discharge: 2013-01-03 | Disposition: A | Discharge status: Home / Self Care | Payer: Medicare Other | Source: Ambulatory Visit | Attending: Nephrology | Admitting: Nephrology

## 2013-01-03 DIAGNOSIS — D649 Anemia, unspecified: Secondary | ICD-10-CM | POA: Insufficient documentation

## 2013-01-03 LAB — RENAL FUNCTION PANEL
Albumin: 3.8 g/dL (ref 3.5–5.2)
BUN: 55 mg/dL — ABNORMAL HIGH (ref 6–23)
Calcium: 11 mg/dL — ABNORMAL HIGH (ref 8.4–10.5)
GFR calc Af Amer: 19 mL/min — ABNORMAL LOW (ref >90)
GFR calc non Af Amer: 16 mL/min — ABNORMAL LOW (ref >90)
Glucose, Bld: 136 mg/dL — ABNORMAL HIGH (ref 70–99)
Phosphorus: 4.9 mg/dL — ABNORMAL HIGH (ref 2.3–4.6)
Potassium: 4.1 mEq/L (ref 3.5–5.1)
Sodium: 137 mEq/L (ref 135–145)

## 2013-01-03 LAB — IRON AND TIBC
Iron: 78 ug/dL (ref 42–135)
UIBC: 135 ug/dL (ref 125–400)

## 2013-01-03 MED ORDER — EPOETIN ALFA 2000 UNIT/ML IJ SOLN
INTRAMUSCULAR | Status: AC
  Filled 2013-01-03: qty 1

## 2013-01-03 MED ORDER — EPOETIN ALFA 10000 UNIT/ML IJ SOLN
40000.0000 [IU] | INTRAMUSCULAR | Status: DC
  Administered 2013-01-03: 40000 [IU] via SUBCUTANEOUS

## 2013-01-10 DIAGNOSIS — Z940 Kidney transplant status: Secondary | ICD-10-CM | POA: Diagnosis not present

## 2013-01-31 ENCOUNTER — Encounter (HOSPITAL_COMMUNITY)
Admission: RE | Admit: 2013-01-31 | Discharge: 2013-01-31 | Disposition: A | Discharge status: Home / Self Care | Payer: Medicare Other | Source: Ambulatory Visit | Attending: Nephrology | Admitting: Nephrology

## 2013-01-31 DIAGNOSIS — D649 Anemia, unspecified: Secondary | ICD-10-CM | POA: Diagnosis not present

## 2013-01-31 LAB — IRON AND TIBC
Iron: 86 ug/dL (ref 42–135)
Saturation Ratios: 42 % (ref 20–55)
TIBC: 207 ug/dL — ABNORMAL LOW (ref 250–470)

## 2013-01-31 LAB — RENAL FUNCTION PANEL
BUN: 45 mg/dL — ABNORMAL HIGH (ref 6–23)
CO2: 21 mEq/L (ref 19–32)
Chloride: 105 mEq/L (ref 96–112)
Creatinine, Ser: 2.59 mg/dL — ABNORMAL HIGH (ref 0.50–1.10)

## 2013-01-31 LAB — POCT HEMOGLOBIN-HEMACUE: Hemoglobin: 10.5 g/dL — ABNORMAL LOW (ref 12.0–15.0)

## 2013-01-31 MED ORDER — EPOETIN ALFA 2000 UNIT/ML IJ SOLN
INTRAMUSCULAR | Status: AC
  Filled 2013-01-31: qty 1

## 2013-01-31 MED ORDER — EPOETIN ALFA 10000 UNIT/ML IJ SOLN
40000.0000 [IU] | INTRAMUSCULAR | Status: DC
  Administered 2013-01-31: 40000 [IU] via SUBCUTANEOUS

## 2013-02-08 DIAGNOSIS — Z940 Kidney transplant status: Secondary | ICD-10-CM | POA: Diagnosis not present

## 2013-02-15 ENCOUNTER — Encounter: Payer: Self-pay | Admitting: Cardiovascular Disease

## 2013-02-15 ENCOUNTER — Ambulatory Visit (INDEPENDENT_AMBULATORY_CARE_PROVIDER_SITE_OTHER): Payer: Medicare Other | Admitting: Cardiovascular Disease

## 2013-02-15 ENCOUNTER — Encounter (INDEPENDENT_AMBULATORY_CARE_PROVIDER_SITE_OTHER): Payer: Self-pay

## 2013-02-15 VITALS — BP 118/70 | HR 66 | Ht 63.0 in | Wt 137.0 lb

## 2013-02-15 DIAGNOSIS — E039 Hypothyroidism, unspecified: Secondary | ICD-10-CM

## 2013-02-15 DIAGNOSIS — I4891 Unspecified atrial fibrillation: Secondary | ICD-10-CM | POA: Diagnosis not present

## 2013-02-15 DIAGNOSIS — Z940 Kidney transplant status: Secondary | ICD-10-CM | POA: Diagnosis not present

## 2013-02-15 DIAGNOSIS — I10 Essential (primary) hypertension: Secondary | ICD-10-CM

## 2013-02-15 NOTE — Assessment & Plan Note (Signed)
Maint NSR continue low dose amiodarone and life long anticoagulation

## 2013-02-15 NOTE — Patient Instructions (Signed)
Your physician wants you to follow-up in:  [place] will receive a reminder letter in the mail two months in advance. If you don't receive a letter, please call our office to schedule the follow-up appointment. Your physician recommends that you continue on your current medications as directed. Please refer to the Current Medication list given to you today.

## 2013-02-15 NOTE — Assessment & Plan Note (Signed)
Well controlled.  Continue current medications and low sodium Dash type diet.    

## 2013-02-15 NOTE — Progress Notes (Signed)
Patient ID: [name], female   DOB: 07/31/1940, 72 y.o.   MRN: [id number] 72 yo with HTN and PAF  Previously seen by [facility] and most recently by [facility] Dr Matthew Saras.  Reviewed over 35 pages of records.  Longstanding PAF and history of DVT On chronic coumadin.  CRF with previous dialysis and transplant 2006 Baseline Cr 2.1  She is on low dose amiodarone 50 bid noted labs 1/14 wit suppressed TSH .24 ( LLN .34) She has been maint NSR with no dyspnea chest pain or palpitations No echo's but patient indicates normal LV function.  She uses Dr Dederding as primary.            ROS: Denies fever, malais, weight loss, blurry vision, decreased visual acuity, cough, sputum, SOB, hemoptysis, pleuritic pain, palpitaitons, heartburn, abdominal pain, melena, lower extremity edema, claudication, or rash.  All other systems reviewed and negative   General: Affect appropriate Healthy:  appears stated age 73: normal Neck supple with no adenopathy JVP normal no bruits no thyromegaly Lungs clear with no wheezing and good diaphragmatic motion Heart:  S1/S2 no murmur,rub, gallop or click PMI normal Abdomen: benighn, BS positve, no tenderness, no AAA no bruit.  No HSM or HJR Distal pulses intact with no bruits No edema Neuro non-focal Skin warm and dry No muscular weakness Graft in LUE and coil in R antecubital area   Medications Current Outpatient Prescriptions  Medication Sig Dispense Refill  . amiodarone (PACERONE) 200 MG tablet Take 100 mg by mouth 2 (two) times daily.       . calcitRIOL (ROCALTROL) 0.25 MCG capsule Take 0.25 mcg by mouth every other day.       Marland Kitchen epoetin alfa (EPOGEN,PROCRIT) 78938 UNIT/ML injection Inject 40,000 Units into the skin every 30 (thirty) days.      Marland Kitchen gabapentin (NEURONTIN) 100 MG capsule Take 200 mg by mouth at bedtime.       Marland Kitchen levothyroxine (SYNTHROID, LEVOTHROID) 100 MCG tablet Take 100 mcg by mouth daily.      . metoprolol (LOPRESSOR) 100 MG tablet  Take 100 mg by mouth 2 (two) times daily.      . mycophenolate (MYFORTIC) 180 MG EC tablet Take 360 mg by mouth 2 (two) times daily.        Marland Kitchen omeprazole (PRILOSEC) 20 MG capsule Take 20 mg by mouth daily.       . polyethylene glycol (MIRALAX / GLYCOLAX) packet Take 17 g by mouth 2 (two) times daily as needed. For constipation      . predniSONE (DELTASONE) 5 MG tablet Take 5 mg by mouth daily.       . tacrolimus (PROGRAF) 1 MG capsule Take 2-3 mg by mouth 2 (two) times daily. Take 3 capsules in AM (along with 76m capsule for 846mtotal) & 2 capsule in the PM (along with 33m2mapsule for total 7mg91m    . tacrolimus (PROGRAF) 5 MG capsule Take 5 mg by mouth 2 (two) times daily. 1 capsule by mouth in AM (along with two 1mg 79msules for total 7mg) 39m capsule in the PM (along with one 1mg ca4mle for total dose 6mg)   59m. warfarin (COUMADIN) 5 MG tablet Take 2.5-5 mg by mouth daily. 1 tablet (33mg) by 88mth every day except a 1/2 tablet (2.33mg) on S333mrday and Sunday      . NIFEdipine (PROCARDIA XL/ADALAT-CC) 60 MG 24 hr tablet Take 60 mg by mouth daily.  No current facility-administered medications for this visit.    Allergies Amoxicillin; Ampicillin; Ciprofloxacin; Erythromycin; Penicillins; Sulfa antibiotics; Sulfamethoxazole-trimethoprim; Tetracycline; and Labetalol  Family History: No family history on file.  Social History: History   Social History  . Marital Status: Married    Spouse Name: N/A    Number of Children: N/A  . Years of Education: N/A   Occupational History  . Not on file.   Social History Main Topics  . Smoking status: Never Smoker   . Smokeless tobacco: Never Used  . Alcohol Use: No  . Drug Use: No  . Sexual Activity: Yes    Birth Control/ Protection: Post-menopausal   Other Topics Concern  . Not on file   Social History Narrative  . No narrative on file    Electrocardiogram: 2/14 [facility] NSR LVH  Assessment and Plan

## 2013-02-15 NOTE — Assessment & Plan Note (Signed)
Continue prograf and quarterly Cr F/U reanl

## 2013-02-15 NOTE — Assessment & Plan Note (Signed)
2/14 TSH suppressed wit send note to Dr Deterding This may increase risk of PAF  ? Check T4

## 2013-02-28 ENCOUNTER — Encounter (HOSPITAL_COMMUNITY): Payer: Medicare Other

## 2013-03-07 ENCOUNTER — Encounter (HOSPITAL_COMMUNITY)
Admission: RE | Admit: 2013-03-07 | Discharge: 2013-03-07 | Disposition: A | Discharge status: Home / Self Care | Payer: Medicare Other | Source: Ambulatory Visit | Attending: Nephrology | Admitting: Nephrology

## 2013-03-07 DIAGNOSIS — D649 Anemia, unspecified: Secondary | ICD-10-CM | POA: Insufficient documentation

## 2013-03-07 LAB — RENAL FUNCTION PANEL
Calcium: 9.4 mg/dL (ref 8.4–10.5)
Creatinine, Ser: 2.59 mg/dL — ABNORMAL HIGH (ref 0.50–1.10)
GFR calc Af Amer: 21 mL/min — ABNORMAL LOW (ref >90)
GFR calc non Af Amer: 18 mL/min — ABNORMAL LOW (ref >90)
Glucose, Bld: 89 mg/dL (ref 70–99)
Phosphorus: 4 mg/dL (ref 2.3–4.6)
Sodium: 137 mEq/L (ref 135–145)

## 2013-03-07 LAB — IRON AND TIBC: UIBC: 152 ug/dL (ref 125–400)

## 2013-03-07 MED ORDER — EPOETIN ALFA 10000 UNIT/ML IJ SOLN: 40000.0000 [IU] | INTRAMUSCULAR | Status: DC

## 2013-03-07 MED ORDER — EPOETIN ALFA 40000 UNIT/ML IJ SOLN
INTRAMUSCULAR | Status: AC
  Administered 2013-03-07: 40000 [IU]
  Filled 2013-03-07: qty 1

## 2013-03-11 DIAGNOSIS — N184 Chronic kidney disease, stage 4 (severe): Secondary | ICD-10-CM | POA: Diagnosis not present

## 2013-03-11 DIAGNOSIS — I129 Hypertensive chronic kidney disease with stage 1 through stage 4 chronic kidney disease, or unspecified chronic kidney disease: Secondary | ICD-10-CM | POA: Diagnosis not present

## 2013-03-11 DIAGNOSIS — Z23 Encounter for immunization: Secondary | ICD-10-CM | POA: Diagnosis not present

## 2013-03-11 DIAGNOSIS — Z940 Kidney transplant status: Secondary | ICD-10-CM | POA: Diagnosis not present

## 2013-03-11 DIAGNOSIS — D649 Anemia, unspecified: Secondary | ICD-10-CM | POA: Diagnosis not present

## 2013-03-11 DIAGNOSIS — N390 Urinary tract infection, site not specified: Secondary | ICD-10-CM | POA: Diagnosis not present

## 2013-03-11 DIAGNOSIS — I82409 Acute embolism and thrombosis of unspecified deep veins of unspecified lower extremity: Secondary | ICD-10-CM | POA: Diagnosis not present

## 2013-03-11 DIAGNOSIS — E038 Other specified hypothyroidism: Secondary | ICD-10-CM | POA: Diagnosis not present

## 2013-03-20 DIAGNOSIS — Z940 Kidney transplant status: Secondary | ICD-10-CM | POA: Diagnosis not present

## 2013-03-20 DIAGNOSIS — I82409 Acute embolism and thrombosis of unspecified deep veins of unspecified lower extremity: Secondary | ICD-10-CM | POA: Diagnosis not present

## 2013-04-10 ENCOUNTER — Other Ambulatory Visit (HOSPITAL_COMMUNITY): Payer: Self-pay | Admitting: *Deleted

## 2013-04-11 ENCOUNTER — Encounter (HOSPITAL_COMMUNITY)
Admission: RE | Admit: 2013-04-11 | Discharge: 2013-04-11 | Disposition: A | Discharge status: Home / Self Care | Payer: Medicare Other | Source: Ambulatory Visit | Attending: Nephrology | Admitting: Nephrology

## 2013-04-11 DIAGNOSIS — D649 Anemia, unspecified: Secondary | ICD-10-CM | POA: Insufficient documentation

## 2013-04-11 LAB — RENAL FUNCTION PANEL
Albumin: 4 g/dL (ref 3.5–5.2)
BUN: 38 mg/dL — ABNORMAL HIGH (ref 6–23)
Calcium: 9.5 mg/dL (ref 8.4–10.5)
Chloride: 105 mEq/L (ref 96–112)
Creatinine, Ser: 2.34 mg/dL — ABNORMAL HIGH (ref 0.50–1.10)
GFR calc Af Amer: 23 mL/min — ABNORMAL LOW (ref >90)
GFR calc non Af Amer: 20 mL/min — ABNORMAL LOW (ref >90)
Phosphorus: 3.6 mg/dL (ref 2.3–4.6)

## 2013-04-11 LAB — POCT HEMOGLOBIN-HEMACUE: Hemoglobin: 10.5 g/dL — ABNORMAL LOW (ref 12.0–15.0)

## 2013-04-11 MED ORDER — EPOETIN ALFA 40000 UNIT/ML IJ SOLN
INTRAMUSCULAR | Status: AC
  Administered 2013-04-11: 40000 [IU] via SUBCUTANEOUS
  Filled 2013-04-11: qty 1

## 2013-04-11 MED ORDER — EPOETIN ALFA 10000 UNIT/ML IJ SOLN: 40000.0000 [IU] | INTRAMUSCULAR | Status: DC

## 2013-04-12 LAB — IRON AND TIBC: TIBC: 216 ug/dL — ABNORMAL LOW (ref 250–470)

## 2013-05-16 ENCOUNTER — Encounter (HOSPITAL_COMMUNITY)
Admission: RE | Admit: 2013-05-16 | Discharge: 2013-05-16 | Disposition: A | Discharge status: Home / Self Care | Payer: Medicare Other | Source: Ambulatory Visit | Attending: Nephrology | Admitting: Nephrology

## 2013-05-16 DIAGNOSIS — D649 Anemia, unspecified: Secondary | ICD-10-CM | POA: Insufficient documentation

## 2013-05-16 LAB — RENAL FUNCTION PANEL
ALBUMIN: 3.9 g/dL (ref 3.5–5.2)
BUN: 42 mg/dL — ABNORMAL HIGH (ref 6–23)
CALCIUM: 9.6 mg/dL (ref 8.4–10.5)
CO2: 22 mEq/L (ref 19–32)
CREATININE: 2.52 mg/dL — AB (ref 0.50–1.10)
Chloride: 104 mEq/L (ref 96–112)
GFR calc Af Amer: 21 mL/min — ABNORMAL LOW (ref >90)
GFR calc non Af Amer: 18 mL/min — ABNORMAL LOW (ref >90)
Glucose, Bld: 108 mg/dL — ABNORMAL HIGH (ref 70–99)
PHOSPHORUS: 3.5 mg/dL (ref 2.3–4.6)
Potassium: 4.2 mEq/L (ref 3.7–5.3)
Sodium: 139 mEq/L (ref 137–147)

## 2013-05-16 LAB — IRON AND TIBC
Iron: 99 ug/dL (ref 42–135)
Saturation Ratios: 48 % (ref 20–55)
TIBC: 206 ug/dL — ABNORMAL LOW (ref 250–470)
UIBC: 107 ug/dL — ABNORMAL LOW (ref 125–400)

## 2013-05-16 LAB — POCT HEMOGLOBIN-HEMACUE: HEMOGLOBIN: 10.6 g/dL — AB (ref 12.0–15.0)

## 2013-05-16 MED ORDER — EPOETIN ALFA 10000 UNIT/ML IJ SOLN: 40000.0000 [IU] | INTRAMUSCULAR | Status: DC

## 2013-05-16 MED ORDER — EPOETIN ALFA 40000 UNIT/ML IJ SOLN
INTRAMUSCULAR | Status: AC
  Administered 2013-05-16: 40000 [IU] via SUBCUTANEOUS
  Filled 2013-05-16: qty 1

## 2013-05-29 ENCOUNTER — Ambulatory Visit
Admission: RE | Admit: 2013-05-29 | Discharge: 2013-05-29 | Disposition: A | Discharge status: Home / Self Care | Payer: Medicare Other | Source: Ambulatory Visit | Attending: Nephrology | Admitting: Nephrology

## 2013-05-29 ENCOUNTER — Other Ambulatory Visit: Payer: Self-pay | Admitting: Nephrology

## 2013-05-29 DIAGNOSIS — R509 Fever, unspecified: Secondary | ICD-10-CM

## 2013-05-29 DIAGNOSIS — N390 Urinary tract infection, site not specified: Secondary | ICD-10-CM | POA: Diagnosis not present

## 2013-05-29 DIAGNOSIS — R05 Cough: Secondary | ICD-10-CM | POA: Diagnosis not present

## 2013-05-29 DIAGNOSIS — Z940 Kidney transplant status: Secondary | ICD-10-CM | POA: Diagnosis not present

## 2013-05-29 DIAGNOSIS — R059 Cough, unspecified: Secondary | ICD-10-CM | POA: Diagnosis not present

## 2013-05-30 DIAGNOSIS — R509 Fever, unspecified: Secondary | ICD-10-CM | POA: Diagnosis not present

## 2013-06-10 DIAGNOSIS — E785 Hyperlipidemia, unspecified: Secondary | ICD-10-CM | POA: Diagnosis not present

## 2013-06-10 DIAGNOSIS — D631 Anemia in chronic kidney disease: Secondary | ICD-10-CM | POA: Diagnosis not present

## 2013-06-10 DIAGNOSIS — N2581 Secondary hyperparathyroidism of renal origin: Secondary | ICD-10-CM | POA: Diagnosis not present

## 2013-06-10 DIAGNOSIS — I129 Hypertensive chronic kidney disease with stage 1 through stage 4 chronic kidney disease, or unspecified chronic kidney disease: Secondary | ICD-10-CM | POA: Diagnosis not present

## 2013-06-10 DIAGNOSIS — N184 Chronic kidney disease, stage 4 (severe): Secondary | ICD-10-CM | POA: Diagnosis not present

## 2013-06-10 DIAGNOSIS — D649 Anemia, unspecified: Secondary | ICD-10-CM | POA: Diagnosis not present

## 2013-06-10 DIAGNOSIS — Z940 Kidney transplant status: Secondary | ICD-10-CM | POA: Diagnosis not present

## 2013-06-10 DIAGNOSIS — I82409 Acute embolism and thrombosis of unspecified deep veins of unspecified lower extremity: Secondary | ICD-10-CM | POA: Diagnosis not present

## 2013-06-10 DIAGNOSIS — E038 Other specified hypothyroidism: Secondary | ICD-10-CM | POA: Diagnosis not present

## 2013-06-10 DIAGNOSIS — N039 Chronic nephritic syndrome with unspecified morphologic changes: Secondary | ICD-10-CM | POA: Diagnosis not present

## 2013-06-13 ENCOUNTER — Encounter (HOSPITAL_COMMUNITY)
Admission: RE | Admit: 2013-06-13 | Discharge: 2013-06-13 | Disposition: A | Discharge status: Home / Self Care | Payer: Medicare Other | Source: Ambulatory Visit | Attending: Nephrology | Admitting: Nephrology

## 2013-06-13 DIAGNOSIS — D649 Anemia, unspecified: Secondary | ICD-10-CM | POA: Diagnosis not present

## 2013-06-13 LAB — POCT HEMOGLOBIN-HEMACUE: Hemoglobin: 11 g/dL — ABNORMAL LOW (ref 12.0–15.0)

## 2013-06-13 MED ORDER — EPOETIN ALFA 10000 UNIT/ML IJ SOLN: 40000.0000 [IU] | INTRAMUSCULAR | Status: DC

## 2013-06-13 MED ORDER — SODIUM CHLORIDE 0.9 % IV SOLN
1020.0000 mg | Freq: Once | INTRAVENOUS | Status: DC
  Filled 2013-06-13: qty 34

## 2013-06-13 MED ORDER — SODIUM CHLORIDE 0.9 % IV SOLN: INTRAVENOUS | Status: DC

## 2013-06-13 MED ORDER — EPOETIN ALFA 40000 UNIT/ML IJ SOLN
INTRAMUSCULAR | Status: AC
  Administered 2013-06-13: 13:00:00 40000 [IU] via SUBCUTANEOUS
  Filled 2013-06-13: qty 1

## 2013-06-14 LAB — URINE CULTURE
CULTURE: NO GROWTH
Colony Count: NO GROWTH

## 2013-06-20 DIAGNOSIS — I82409 Acute embolism and thrombosis of unspecified deep veins of unspecified lower extremity: Secondary | ICD-10-CM | POA: Diagnosis not present

## 2013-06-27 ENCOUNTER — Telehealth: Payer: Self-pay | Admitting: Internal Medicine

## 2013-06-27 NOTE — Telephone Encounter (Signed)
Spoke with pt. appt scheduled with CDY to re-establish for sleep as she has not been seen in a while. Nothing further needed

## 2013-07-11 ENCOUNTER — Encounter (HOSPITAL_COMMUNITY)
Admission: RE | Admit: 2013-07-11 | Discharge: 2013-07-11 | Disposition: A | Discharge status: Home / Self Care | Payer: Medicare Other | Source: Ambulatory Visit | Attending: Nephrology | Admitting: Nephrology

## 2013-07-11 DIAGNOSIS — D649 Anemia, unspecified: Secondary | ICD-10-CM | POA: Diagnosis not present

## 2013-07-11 LAB — RENAL FUNCTION PANEL
ALBUMIN: 3.7 g/dL (ref 3.5–5.2)
BUN: 44 mg/dL — ABNORMAL HIGH (ref 6–23)
CO2: 20 mEq/L (ref 19–32)
Calcium: 9.5 mg/dL (ref 8.4–10.5)
Chloride: 104 mEq/L (ref 96–112)
Creatinine, Ser: 2.56 mg/dL — ABNORMAL HIGH (ref 0.50–1.10)
GFR calc non Af Amer: 18 mL/min — ABNORMAL LOW (ref >90)
GFR, EST AFRICAN AMERICAN: 21 mL/min — AB (ref >90)
Glucose, Bld: 103 mg/dL — ABNORMAL HIGH (ref 70–99)
PHOSPHORUS: 3.7 mg/dL (ref 2.3–4.6)
POTASSIUM: 4.5 meq/L (ref 3.7–5.3)
SODIUM: 139 meq/L (ref 137–147)

## 2013-07-11 LAB — IRON AND TIBC
IRON: 82 ug/dL (ref 42–135)
Saturation Ratios: 42 % (ref 20–55)
TIBC: 197 ug/dL — ABNORMAL LOW (ref 250–470)
UIBC: 115 ug/dL — ABNORMAL LOW (ref 125–400)

## 2013-07-11 LAB — POCT HEMOGLOBIN-HEMACUE: Hemoglobin: 10.6 g/dL — ABNORMAL LOW (ref 12.0–15.0)

## 2013-07-11 MED ORDER — EPOETIN ALFA 10000 UNIT/ML IJ SOLN: 40000.0000 [IU] | INTRAMUSCULAR | Status: DC

## 2013-07-11 MED ORDER — EPOETIN ALFA 40000 UNIT/ML IJ SOLN
INTRAMUSCULAR | Status: AC
  Administered 2013-07-11: 40000 [IU] via SUBCUTANEOUS
  Filled 2013-07-11: qty 1

## 2013-07-12 LAB — FERRITIN: Ferritin: 750 ng/mL — ABNORMAL HIGH (ref 10–291)

## 2013-07-23 ENCOUNTER — Emergency Department (HOSPITAL_BASED_OUTPATIENT_CLINIC_OR_DEPARTMENT_OTHER)
Admission: EM | Admit: 2013-07-23 | Discharge: 2013-07-23 | Disposition: A | Discharge status: Home / Self Care | Payer: Medicare Other | Attending: Emergency Medicine | Admitting: Emergency Medicine

## 2013-07-23 ENCOUNTER — Encounter (HOSPITAL_BASED_OUTPATIENT_CLINIC_OR_DEPARTMENT_OTHER): Payer: Self-pay | Admitting: Emergency Medicine

## 2013-07-23 DIAGNOSIS — Z7901 Long term (current) use of anticoagulants: Secondary | ICD-10-CM | POA: Insufficient documentation

## 2013-07-23 DIAGNOSIS — IMO0002 Reserved for concepts with insufficient information to code with codable children: Secondary | ICD-10-CM | POA: Insufficient documentation

## 2013-07-23 DIAGNOSIS — N289 Disorder of kidney and ureter, unspecified: Secondary | ICD-10-CM | POA: Diagnosis not present

## 2013-07-23 DIAGNOSIS — R509 Fever, unspecified: Secondary | ICD-10-CM | POA: Diagnosis not present

## 2013-07-23 DIAGNOSIS — Z85528 Personal history of other malignant neoplasm of kidney: Secondary | ICD-10-CM | POA: Insufficient documentation

## 2013-07-23 DIAGNOSIS — Z87448 Personal history of other diseases of urinary system: Secondary | ICD-10-CM | POA: Insufficient documentation

## 2013-07-23 DIAGNOSIS — Z940 Kidney transplant status: Secondary | ICD-10-CM | POA: Diagnosis not present

## 2013-07-23 DIAGNOSIS — E039 Hypothyroidism, unspecified: Secondary | ICD-10-CM | POA: Diagnosis not present

## 2013-07-23 DIAGNOSIS — R197 Diarrhea, unspecified: Secondary | ICD-10-CM | POA: Diagnosis not present

## 2013-07-23 DIAGNOSIS — Z880 Allergy status to penicillin: Secondary | ICD-10-CM | POA: Insufficient documentation

## 2013-07-23 DIAGNOSIS — K219 Gastro-esophageal reflux disease without esophagitis: Secondary | ICD-10-CM | POA: Diagnosis not present

## 2013-07-23 DIAGNOSIS — Z8739 Personal history of other diseases of the musculoskeletal system and connective tissue: Secondary | ICD-10-CM | POA: Diagnosis not present

## 2013-07-23 DIAGNOSIS — I4891 Unspecified atrial fibrillation: Secondary | ICD-10-CM | POA: Diagnosis not present

## 2013-07-23 DIAGNOSIS — Z79899 Other long term (current) drug therapy: Secondary | ICD-10-CM | POA: Diagnosis not present

## 2013-07-23 DIAGNOSIS — R112 Nausea with vomiting, unspecified: Secondary | ICD-10-CM | POA: Diagnosis not present

## 2013-07-23 DIAGNOSIS — I10 Essential (primary) hypertension: Secondary | ICD-10-CM | POA: Diagnosis not present

## 2013-07-23 DIAGNOSIS — D849 Immunodeficiency, unspecified: Secondary | ICD-10-CM

## 2013-07-23 DIAGNOSIS — Z862 Personal history of diseases of the blood and blood-forming organs and certain disorders involving the immune mechanism: Secondary | ICD-10-CM | POA: Diagnosis not present

## 2013-07-23 DIAGNOSIS — Z9981 Dependence on supplemental oxygen: Secondary | ICD-10-CM | POA: Insufficient documentation

## 2013-07-23 DIAGNOSIS — Z86718 Personal history of other venous thrombosis and embolism: Secondary | ICD-10-CM | POA: Insufficient documentation

## 2013-07-23 DIAGNOSIS — G4733 Obstructive sleep apnea (adult) (pediatric): Secondary | ICD-10-CM | POA: Diagnosis not present

## 2013-07-23 DIAGNOSIS — D899 Disorder involving the immune mechanism, unspecified: Secondary | ICD-10-CM

## 2013-07-23 LAB — CBC WITH DIFFERENTIAL/PLATELET
Basophils Absolute: 0 10*3/uL (ref 0.0–0.1)
Basophils Relative: 0 % (ref 0–1)
EOS ABS: 0.1 10*3/uL (ref 0.0–0.7)
EOS PCT: 0 % (ref 0–5)
HCT: 40.6 % (ref 36.0–46.0)
HEMOGLOBIN: 12.4 g/dL (ref 12.0–15.0)
LYMPHS ABS: 0.6 10*3/uL — AB (ref 0.7–4.0)
Lymphocytes Relative: 4 % — ABNORMAL LOW (ref 12–46)
MCH: 29.1 pg (ref 26.0–34.0)
MCHC: 30.5 g/dL (ref 30.0–36.0)
MCV: 95.3 fL (ref 78.0–100.0)
MONOS PCT: 9 % (ref 3–12)
Monocytes Absolute: 1.4 10*3/uL — ABNORMAL HIGH (ref 0.1–1.0)
Neutro Abs: 12.6 10*3/uL — ABNORMAL HIGH (ref 1.7–7.7)
Neutrophils Relative %: 86 % — ABNORMAL HIGH (ref 43–77)
Platelets: 158 10*3/uL (ref 150–400)
RBC: 4.26 MIL/uL (ref 3.87–5.11)
RDW: 16.7 % — ABNORMAL HIGH (ref 11.5–15.5)
WBC: 14.6 10*3/uL — ABNORMAL HIGH (ref 4.0–10.5)

## 2013-07-23 LAB — COMPREHENSIVE METABOLIC PANEL
ALK PHOS: 117 U/L (ref 39–117)
ALT: 12 U/L (ref 0–35)
AST: 18 U/L (ref 0–37)
Albumin: 4.3 g/dL (ref 3.5–5.2)
BUN: 44 mg/dL — AB (ref 6–23)
CALCIUM: 10.3 mg/dL (ref 8.4–10.5)
CO2: 20 mEq/L (ref 19–32)
Chloride: 101 mEq/L (ref 96–112)
Creatinine, Ser: 2 mg/dL — ABNORMAL HIGH (ref 0.50–1.10)
GFR, EST AFRICAN AMERICAN: 28 mL/min — AB (ref >90)
GFR, EST NON AFRICAN AMERICAN: 24 mL/min — AB (ref >90)
GLUCOSE: 102 mg/dL — AB (ref 70–99)
POTASSIUM: 4.6 meq/L (ref 3.7–5.3)
Sodium: 139 mEq/L (ref 137–147)
Total Bilirubin: 0.6 mg/dL (ref 0.3–1.2)
Total Protein: 7.8 g/dL (ref 6.0–8.3)

## 2013-07-23 LAB — PROTIME-INR
INR: 2.17 — ABNORMAL HIGH (ref 0.00–1.49)
PROTHROMBIN TIME: 23.5 s — AB (ref 11.6–15.2)

## 2013-07-23 MED ORDER — ONDANSETRON 8 MG PO TBDP: 8.0000 mg | ORAL_TABLET | Freq: Three times a day (TID) | ORAL | Status: DC | PRN

## 2013-07-23 MED ORDER — PROMETHAZINE HCL 25 MG PO TABS: 25.0000 mg | ORAL_TABLET | Freq: Four times a day (QID) | ORAL | Status: DC | PRN

## 2013-07-23 MED ORDER — ONDANSETRON HCL 4 MG/2ML IJ SOLN
4.0000 mg | Freq: Once | INTRAMUSCULAR | Status: AC
  Administered 2013-07-23: 4 mg via INTRAVENOUS
  Filled 2013-07-23: qty 2

## 2013-07-23 MED ORDER — SODIUM CHLORIDE 0.9 % IV BOLUS (SEPSIS)
500.0000 mL | Freq: Once | INTRAVENOUS | Status: AC
  Administered 2013-07-23: 500 mL via INTRAVENOUS

## 2013-07-23 NOTE — ED Provider Notes (Signed)
CSN: [id number]     Arrival date & time 07/23/13  1212 History   First MD Initiated Contact with Patient 07/23/13 1324     Chief Complaint  Patient presents with  . Emesis  . Fever     (Consider location/radiation/quality/duration/timing/severity/associated sxs/prior Treatment) HPI [name] is a 73 year old female status post kidney transplant 6 years ago on continued immunosuppression with Prograf and prednisone who began having some nausea, vomiting, and diarrhea at  approximately 3 AM. She has had subjective fever and some chills. She had 3 episodes of vomiting of yellowish type material and 2 episodes of runny diarrhea. She states she's been very nauseated and tried to drink some Coke but was unable to secondary to the nausea. She has had some increased creatinine over the past 6 months and on review these have been about 2.5. She is followed by Dr. Jimmy Footman for her renal issues and he also acts as her primary care Dr. She denies any abdominal pain, cough, chest pain, change in urinary symptoms or frequency, or generalized weakness. She came in by private vehicle with her husband. She has had some possible sick contacts.  Past Medical History  Diagnosis Date  . Crohn's disease   . Hypertension   . Refusal of blood transfusion for reasons of conscience 12/20/2011    pt is Jehovah's Witness  . Atrial fibrillation   . DVT of leg (deep venous thrombosis) 2012-8./13/2013    "have had one in each leg now"  . Numbness and tingling of foot     bilaterally  . GERD (gastroesophageal reflux disease)   . Hypothyroidism   . Lower GI bleed 2012  . DVT of lower extremity, bilateral     "have had them in both legs; last one was on the RLE this year" (04/25/2012)  . OSA on CPAP   . Anemia     "when lupus flares up"  . SLE (systemic lupus erythematosus)   . Arthritis     "in my joints"  . Renal disorder     S/P nephrectomy; dialysis; "working fine now" (12/20/11)  . Renal cell carcinoma     Past Surgical History  Procedure Laterality Date  . Cesarean section  1984  . Nephrectomy transplanted organ  2006    bilaterally  . Colectomy  1998    "removal of abscess" (04/25/2012)  . Excisional hemorrhoidectomy    . Cataract extraction w/ intraocular lens  implant, bilateral  2005-2010  . Vena cava filter placement  2012  . Insertion of dialysis catheter  1988-2006    "peritoneal and hemodialysis; had multiple grafts and fistulas; last graft clotted off 2012"  . Cholecystectomy  1995  . Colostomy  1998  . Colostomy reversal  1999    "6 months after placed" (04/25/2012)  . Bone marrow biopsy  1993; 1995  . Abdominal exploration surgery  1995    "found sluggish bone marrow" (04/25/2012)   No family history on file. History  Substance Use Topics  . Smoking status: Never Smoker   . Smokeless tobacco: Never Used  . Alcohol Use: No   OB History   Grav Para Term Preterm Abortions TAB SAB Ect Mult Living                 Review of Systems  Constitutional: Positive for fever and appetite change.  HENT: Negative.   Eyes: Negative.   Respiratory: Negative.   Gastrointestinal: Positive for nausea, vomiting and diarrhea. Negative for blood in stool.  Endocrine: Negative.   Genitourinary: Negative.   Musculoskeletal: Negative.   Skin: Negative.   Allergic/Immunologic: Negative.   Neurological: Negative.   Hematological: Negative.   Psychiatric/Behavioral: Negative.       Allergies  Amoxicillin; Ampicillin; Ciprofloxacin; Erythromycin; Penicillins; Sulfa antibiotics; Sulfamethoxazole-trimethoprim; Tetracycline; and Labetalol  Home Medications   Current Outpatient Rx  Name  Route  Sig  Dispense  Refill  . amiodarone (PACERONE) 200 MG tablet   Oral   Take 100 mg by mouth 2 (two) times daily.          . calcitRIOL (ROCALTROL) 0.25 MCG capsule   Oral   Take 0.25 mcg by mouth every other day.          Marland Kitchen epoetin alfa (EPOGEN,PROCRIT) 94854 UNIT/ML injection    Subcutaneous   Inject 40,000 Units into the skin every 30 (thirty) days.         Marland Kitchen gabapentin (NEURONTIN) 100 MG capsule   Oral   Take 200 mg by mouth at bedtime.          Marland Kitchen levothyroxine (SYNTHROID, LEVOTHROID) 100 MCG tablet   Oral   Take 100 mcg by mouth daily.         . metoprolol (LOPRESSOR) 100 MG tablet   Oral   Take 100 mg by mouth 2 (two) times daily.         . mycophenolate (MYFORTIC) 180 MG EC tablet   Oral   Take 360 mg by mouth 2 (two) times daily.           Marland Kitchen NIFEdipine (PROCARDIA XL/ADALAT-CC) 60 MG 24 hr tablet   Oral   Take 60 mg by mouth daily.           Marland Kitchen omeprazole (PRILOSEC) 20 MG capsule   Oral   Take 20 mg by mouth daily.          . polyethylene glycol (MIRALAX / GLYCOLAX) packet   Oral   Take 17 g by mouth 2 (two) times daily as needed. For constipation         . predniSONE (DELTASONE) 5 MG tablet   Oral   Take 5 mg by mouth daily.          . tacrolimus (PROGRAF) 1 MG capsule   Oral   Take 2-3 mg by mouth 2 (two) times daily. Take 3 capsules in AM (along with 50m capsule for 861mtotal) & 2 capsule in the PM (along with 51m1mapsule for total 7mg49m       . tacrolimus (PROGRAF) 5 MG capsule   Oral   Take 5 mg by mouth 2 (two) times daily. 1 capsule by mouth in AM (along with two 1mg 20msules for total 7mg) 72m capsule in the PM (along with one 1mg ca29mle for total dose 6mg)   9m   . warfarin (COUMADIN) 5 MG tablet   Oral   Take 2.5-5 mg by mouth daily. 1 tablet (51mg) by 76mth every day except a 1/2 tablet (2.51mg) on S49mrday and Sunday          BP 167/81  Temp(Src) 98.2 F (36.8 C) (Oral)  Resp 20  Ht 5' 3" (1.6 m)  Wt 137 lb (62.143 kg)  BMI 24.27 kg/m2  SpO2 97% Physical Exam  Nursing note and vitals reviewed. Constitutional: She is oriented to person, place, and time. She appears well-developed and well-nourished.  HENT:  Head: Normocephalic and atraumatic.  Right Ear: External ear  normal.  Left Ear:  External ear normal.  Nose: Nose normal.  Mouth/Throat: Oropharynx is clear and moist.  Eyes: Conjunctivae and EOM are normal. Pupils are equal, round, and reactive to light.  Neck: Normal range of motion. Neck supple.  Cardiovascular: Normal rate, regular rhythm, normal heart sounds and intact distal pulses.   Pulmonary/Chest: Effort normal and breath sounds normal.  Abdominal: Soft. Bowel sounds are normal.  Musculoskeletal: Normal range of motion.  Neurological: She is alert and oriented to person, place, and time. She has normal reflexes.  Skin: Skin is warm and dry.  Psychiatric: She has a normal mood and affect. Her behavior is normal. Thought content normal.    ED Course  Procedures (including critical care time) Labs Review Labs Reviewed  CBC WITH DIFFERENTIAL - Abnormal; Notable for the following:    WBC 14.6 (*)    RDW 16.7 (*)    Neutrophils Relative % 86 (*)    Neutro Abs 12.6 (*)    Lymphocytes Relative 4 (*)    Lymphs Abs 0.6 (*)    Monocytes Absolute 1.4 (*)    All other components within normal limits  COMPREHENSIVE METABOLIC PANEL - Abnormal; Notable for the following:    Glucose, Bld 102 (*)    BUN 44 (*)    Creatinine, Ser 2.00 (*)    GFR calc non Af Amer 24 (*)    GFR calc Af Amer 28 (*)    All other components within normal limits  PROTIME-INR - Abnormal; Notable for the following:    Prothrombin Time 23.5 (*)    INR 2.17 (*)    All other components within normal limits   Imaging Review No results found.   EKG Interpretation None      MDM   Final diagnoses:  None    Patient's received 500 cc of normal saline. She has had anti-medics and taking clear liquids currently. I reviewed her labs and her creatinine has decreased to 2.0. Her white blood cell count is elevated at 14,600 with left shift. Do to patient's history of renal transplant and immunosuppressed state, I discussed the patient's care with nephrology. Dr. Posey Pronto was on-call. He will  arrange for her to have followup outpatient labs. I have discussed this with the patient. She is in agreement with the plan. Currently, I plan to observe her for another 45 minutes to assure that there has been no vomiting. She is continuing to drink clear liquids and has currently had approximately 6 ounces by mouth.  Patient without vomiting here.  Plan close follow up and strict return precautions.   Shaune Pollack, MD 07/23/13 [phone]

## 2013-07-23 NOTE — Discharge Instructions (Signed)
Return if worse at any time.  Continue clear liquids and slowly advance to solid foods.  Dr. Posey Pronto will have the lab call you to arrange for follow labs.  Take the nausea medicines as needed.

## 2013-07-23 NOTE — ED Notes (Signed)
Patient states she has a two day history of nausea, vomiting, diarrhea and fever.

## 2013-07-24 DIAGNOSIS — I82409 Acute embolism and thrombosis of unspecified deep veins of unspecified lower extremity: Secondary | ICD-10-CM | POA: Diagnosis not present

## 2013-07-24 DIAGNOSIS — Z940 Kidney transplant status: Secondary | ICD-10-CM | POA: Diagnosis not present

## 2013-07-26 DIAGNOSIS — Z940 Kidney transplant status: Secondary | ICD-10-CM | POA: Diagnosis not present

## 2013-07-29 ENCOUNTER — Institutional Professional Consult (permissible substitution): Payer: Medicare Other | Admitting: Internal Medicine

## 2013-07-30 DIAGNOSIS — H43819 Vitreous degeneration, unspecified eye: Secondary | ICD-10-CM | POA: Diagnosis not present

## 2013-08-05 DIAGNOSIS — Z940 Kidney transplant status: Secondary | ICD-10-CM | POA: Diagnosis not present

## 2013-08-05 DIAGNOSIS — I82409 Acute embolism and thrombosis of unspecified deep veins of unspecified lower extremity: Secondary | ICD-10-CM | POA: Diagnosis not present

## 2013-08-08 ENCOUNTER — Encounter (HOSPITAL_COMMUNITY): Payer: Medicare Other

## 2013-08-08 DIAGNOSIS — I82409 Acute embolism and thrombosis of unspecified deep veins of unspecified lower extremity: Secondary | ICD-10-CM | POA: Diagnosis not present

## 2013-08-08 DIAGNOSIS — Z940 Kidney transplant status: Secondary | ICD-10-CM | POA: Diagnosis not present

## 2013-08-12 ENCOUNTER — Ambulatory Visit (INDEPENDENT_AMBULATORY_CARE_PROVIDER_SITE_OTHER): Payer: Medicare Other | Admitting: Internal Medicine

## 2013-08-12 ENCOUNTER — Encounter: Payer: Self-pay | Admitting: Internal Medicine

## 2013-08-12 VITALS — BP 126/78 | HR 72 | Ht 63.0 in | Wt 131.2 lb

## 2013-08-12 DIAGNOSIS — J309 Allergic rhinitis, unspecified: Secondary | ICD-10-CM | POA: Diagnosis not present

## 2013-08-12 DIAGNOSIS — G4710 Hypersomnia, unspecified: Secondary | ICD-10-CM | POA: Diagnosis not present

## 2013-08-12 DIAGNOSIS — G4730 Sleep apnea, unspecified: Secondary | ICD-10-CM | POA: Diagnosis not present

## 2013-08-12 NOTE — Progress Notes (Signed)
08/12/13- 44 yoF Former patient-sleep study back in 90's. Currently on CPAP set on 10 through [place]. Medical problems include lupus, hypertension, chronic kidney disease/transplant, blood clots, Crohn's. She is using a nasal mask. Wakes with dry mouth despite humidifier. Bedtime 10 PM, sleep latency 30-40 minutes, waking several times before up at 7 AM. Epworth sleepiness score 9/24  Prior to Admission medications   Medication Sig Start Date End Date Taking? Authorizing Provider  amiodarone (PACERONE) 200 MG tablet Take 100 mg by mouth 2 (two) times daily.    Yes Historical Provider, MD  calcitRIOL (ROCALTROL) 0.25 MCG capsule Take 0.25 mcg by mouth every other day.    Yes Historical Provider, MD  epoetin alfa (EPOGEN,PROCRIT) 51884 UNIT/ML injection Inject 40,000 Units into the skin every 30 (thirty) days.   Yes Historical Provider, MD  gabapentin (NEURONTIN) 100 MG capsule Take 200 mg by mouth at bedtime.    Yes Historical Provider, MD  levothyroxine (SYNTHROID, LEVOTHROID) 100 MCG tablet Take 100 mcg by mouth daily.   Yes Historical Provider, MD  metoprolol (LOPRESSOR) 100 MG tablet Take 100 mg by mouth 2 (two) times daily.   Yes Historical Provider, MD  mycophenolate (MYFORTIC) 180 MG EC tablet Take 360 mg by mouth 2 (two) times daily.     Yes Historical Provider, MD  omeprazole (PRILOSEC) 20 MG capsule Take 20 mg by mouth daily.    Yes Historical Provider, MD  polyethylene glycol (MIRALAX / GLYCOLAX) packet Take 17 g by mouth 2 (two) times daily as needed. For constipation   Yes Historical Provider, MD  predniSONE (DELTASONE) 5 MG tablet Take 5 mg by mouth daily.    Yes Historical Provider, MD  tacrolimus (PROGRAF) 1 MG capsule Take 1 mg by mouth 2 (two) times daily. Takes with 5 mg tablet   Yes Historical Provider, MD  warfarin (COUMADIN) 5 MG tablet Take 2.5 mg by mouth daily. 2.5 mg every other day alternating with 1.5   Yes Historical Provider, MD  polyethylene glycol-electrolytes  (NULYTELY/GOLYTELY) 420 G solution  08/27/13   Historical Provider, MD   Past Medical History  Diagnosis Date  . Crohn's disease   . Hypertension   . Refusal of blood transfusion for reasons of conscience 12/20/2011    pt is Jehovah's Witness  . Atrial fibrillation   . DVT of leg (deep venous thrombosis) 2012-8./13/2013    "have had one in each leg now"  . Numbness and tingling of foot     bilaterally  . GERD (gastroesophageal reflux disease)   . Hypothyroidism   . Lower GI bleed 2012  . DVT of lower extremity, bilateral     "have had them in both legs; last one was on the RLE this year" (04/25/2012)  . OSA on CPAP   . Anemia     "when lupus flares up"  . SLE (systemic lupus erythematosus)   . Arthritis     "in my joints"  . Renal disorder     S/P nephrectomy; dialysis; "working fine now" (12/20/11)  . Renal cell carcinoma    Past Surgical History  Procedure Laterality Date  . Cesarean section  1984  . Nephrectomy transplanted organ  2006    bilaterally  . Colectomy  1998    "removal of abscess" (04/25/2012)  . Excisional hemorrhoidectomy    . Cataract extraction w/ intraocular lens  implant, bilateral  2005-2010  . Vena cava filter placement  2012  . Insertion of dialysis catheter  1988-2006    "  peritoneal and hemodialysis; had multiple grafts and fistulas; last graft clotted off 2012"  . Cholecystectomy  1995  . Colostomy  1998  . Colostomy reversal  1999    "6 months after placed" (04/25/2012)  . Bone marrow biopsy  1993; 1995  . Abdominal exploration surgery  1995    "found sluggish bone marrow" (04/25/2012)  . Parathyroid implant removal Left     removed parathyroid from neck and implanted in arm  . Colonoscopy N/A 08/30/2013    Procedure: COLONOSCOPY;  Surgeon: Winfield Cunas., MD;  Location: [facility];  Service: Endoscopy;  Laterality: N/A;   Family History  Problem Relation Age of Onset  . Heart disease Mother   . Hypertension      runs in family  .  Sleep apnea Sister   ROS-see HPI Constitutional:   No-   weight loss, night sweats, fevers, chills, fatigue, lassitude. HEENT:   No-  headaches, difficulty swallowing, tooth/dental problems, sore throat,       No-  sneezing, itching, ear ache, nasal congestion, post nasal drip,  CV:  No-   chest pain, orthopnea, PND, swelling in lower extremities, anasarca,                                  dizziness, palpitations Resp: No-   shortness of breath with exertion or at rest.              No-   productive cough,  No non-productive cough,  No- coughing up of blood.              No-   change in color of mucus.  No- wheezing.   Skin: No-   rash or lesions. GI:  No-   heartburn, indigestion, abdominal pain, nausea, vomiting, diarrhea,                 change in bowel habits, loss of appetite GU: No-   dysuria, change in color of urine, no urgency or frequency.  No- flank pain. MS:  No-   joint pain or swelling.  No- decreased range of motion.  No- back pain. Neuro-     nothing unusual Psych:  No- change in mood or affect. No depression or anxiety.  No memory loss.  OBJ- Physical Exam General- Alert, Oriented, Affect-appropriate, Distress- none acute Skin- rash-none, lesions- none, excoriation- none Lymphadenopathy- none Head- atraumatic            Eyes- Gross vision intact, PERRLA, conjunctivae and secretions clear            Ears- Hearing, canals-normal            Nose- +mucus bridging, no-Septal dev, mucus, polyps, erosion, perforation             Throat- Mallampati II-III , mucosa -Not dry , drainage- none, tonsils- atrophic Neck- flexible , trachea midline, no stridor , thyroid nl, carotid no bruit Chest - symmetrical excursion , unlabored           Heart/CV- RRR , no murmur , no gallop  , no rub, nl s1 s2                           - JVD- none , edema- none, stasis changes- none, varices- none           Lung- clear to P&A, wheeze- none, cough-  none , dullness-none, rub- none           Chest  wall-  Abd- tender-no, distended-no, bowel sounds-present, HSM- no Br/ Gen/ Rectal- Not done, not indicated Extrem- cyanosis- none, clubbing, none, atrophy- none, strength- nl Neuro- grossly intact to observation

## 2013-08-12 NOTE — Patient Instructions (Signed)
I suggest you take your CPAP and humidifier by [facility] to let them check it out and see if everything works ok. We think the pressure is set on 10. [facility] can verify this.  Try changing to a 12 hour version of Zyrtec or Allegra. You may need to ask the pharmacist to find it for you.If you take this in the mornings, there may be less carry over of drying into the night.  Try otc Biotene for dry mouth. Most drug stores have this, and you can use it at bedtime or as needed to relieve dryness.

## 2013-08-13 DIAGNOSIS — I129 Hypertensive chronic kidney disease with stage 1 through stage 4 chronic kidney disease, or unspecified chronic kidney disease: Secondary | ICD-10-CM | POA: Diagnosis not present

## 2013-08-13 DIAGNOSIS — Z940 Kidney transplant status: Secondary | ICD-10-CM | POA: Diagnosis not present

## 2013-08-15 ENCOUNTER — Encounter (HOSPITAL_COMMUNITY)
Admission: RE | Admit: 2013-08-15 | Discharge: 2013-08-15 | Disposition: A | Discharge status: Home / Self Care | Payer: Medicare Other | Source: Ambulatory Visit | Attending: Nephrology | Admitting: Nephrology

## 2013-08-15 DIAGNOSIS — D649 Anemia, unspecified: Secondary | ICD-10-CM | POA: Diagnosis not present

## 2013-08-15 LAB — RENAL FUNCTION PANEL
Albumin: 4 g/dL (ref 3.5–5.2)
BUN: 53 mg/dL — AB (ref 6–23)
CALCIUM: 9.7 mg/dL (ref 8.4–10.5)
CO2: 17 mEq/L — ABNORMAL LOW (ref 19–32)
Chloride: 104 mEq/L (ref 96–112)
Creatinine, Ser: 2.91 mg/dL — ABNORMAL HIGH (ref 0.50–1.10)
GFR calc Af Amer: 18 mL/min — ABNORMAL LOW (ref >90)
GFR calc non Af Amer: 15 mL/min — ABNORMAL LOW (ref >90)
Glucose, Bld: 90 mg/dL (ref 70–99)
PHOSPHORUS: 3.5 mg/dL (ref 2.3–4.6)
POTASSIUM: 4.3 meq/L (ref 3.7–5.3)
Sodium: 139 mEq/L (ref 137–147)

## 2013-08-15 LAB — FERRITIN: Ferritin: 697 ng/mL — ABNORMAL HIGH (ref 10–291)

## 2013-08-15 LAB — IRON AND TIBC
Iron: 113 ug/dL (ref 42–135)
Saturation Ratios: 57 % — ABNORMAL HIGH (ref 20–55)
TIBC: 199 ug/dL — ABNORMAL LOW (ref 250–470)
UIBC: 86 ug/dL — ABNORMAL LOW (ref 125–400)

## 2013-08-15 LAB — POCT HEMOGLOBIN-HEMACUE: Hemoglobin: 10.5 g/dL — ABNORMAL LOW (ref 12.0–15.0)

## 2013-08-15 MED ORDER — EPOETIN ALFA 10000 UNIT/ML IJ SOLN: 40000.0000 [IU] | INTRAMUSCULAR | Status: DC

## 2013-08-15 MED ORDER — EPOETIN ALFA 40000 UNIT/ML IJ SOLN
INTRAMUSCULAR | Status: AC
  Administered 2013-08-15: 40000 [IU]
  Filled 2013-08-15: qty 1

## 2013-08-19 DIAGNOSIS — I82409 Acute embolism and thrombosis of unspecified deep veins of unspecified lower extremity: Secondary | ICD-10-CM | POA: Diagnosis not present

## 2013-08-19 DIAGNOSIS — Z940 Kidney transplant status: Secondary | ICD-10-CM | POA: Diagnosis not present

## 2013-08-21 DIAGNOSIS — R197 Diarrhea, unspecified: Secondary | ICD-10-CM | POA: Diagnosis not present

## 2013-08-21 DIAGNOSIS — K5090 Crohn's disease, unspecified, without complications: Secondary | ICD-10-CM | POA: Diagnosis not present

## 2013-08-21 DIAGNOSIS — Z7901 Long term (current) use of anticoagulants: Secondary | ICD-10-CM | POA: Diagnosis not present

## 2013-08-21 DIAGNOSIS — K921 Melena: Secondary | ICD-10-CM | POA: Diagnosis not present

## 2013-08-21 DIAGNOSIS — T8610 Unspecified complication of kidney transplant: Secondary | ICD-10-CM | POA: Diagnosis not present

## 2013-08-21 DIAGNOSIS — K861 Other chronic pancreatitis: Secondary | ICD-10-CM | POA: Diagnosis not present

## 2013-08-21 DIAGNOSIS — K624 Stenosis of anus and rectum: Secondary | ICD-10-CM | POA: Diagnosis not present

## 2013-08-21 DIAGNOSIS — I4891 Unspecified atrial fibrillation: Secondary | ICD-10-CM | POA: Diagnosis not present

## 2013-08-22 ENCOUNTER — Telehealth: Payer: Self-pay | Admitting: Internal Medicine

## 2013-08-22 NOTE — Telephone Encounter (Signed)
Per OV 08/12/13: Patient Instructions      I suggest you take your CPAP and humidifier by [facility] to let them check it out and see if everything works ok. We think the pressure is set on 10. [facility]ue[facility] Romans can verify this. Try changing to a 12 hour version of Zyrtec or Allegra. You may need to ask the pharmacist to find it for you.If you take this in the mornings, there may be less carry over of drying into the night. Try otc Biotene for dry mouth. Most drug stores have this, and you can use it at bedtime or as needed to relieve dryness.   ---  Spoke with pt. She reports she spoke with [facility] and they advised her they needed a signed document and dated from [place].  I called [facility] and spoke with [facility]. She reports they are needing a copy of sleep study. She is not sure why they don't have this since she has been with them. She checked and confirmed she is not seeing any study on file. She needs this faxed over to them.   I looked in pt chart and it stated pt had sleep study about 15-20 years ago and have been with [facility] since started on CPAP. No sleep study located in pt chart.   Spoke with [place]. She is going to do research and get back with [place]. Will await call back

## 2013-08-23 NOTE — Telephone Encounter (Signed)
Called [facility] [phone]. LMTCB x1 for [place] to f/u on this

## 2013-08-26 DIAGNOSIS — I82409 Acute embolism and thrombosis of unspecified deep veins of unspecified lower extremity: Secondary | ICD-10-CM | POA: Diagnosis not present

## 2013-08-26 DIAGNOSIS — Z940 Kidney transplant status: Secondary | ICD-10-CM | POA: Diagnosis not present

## 2013-08-26 NOTE — Telephone Encounter (Signed)
Pt returning call.[name] ° °

## 2013-08-26 NOTE — Telephone Encounter (Signed)
Spoke with pt and advised that [name] has everything they needs now to process her order.  Pt verbalized understanding.

## 2013-08-26 NOTE — Telephone Encounter (Signed)
I spoke with [place] and she states she sent the pt chart to be reactivated and she states this has ben done. Now they can process the order. She states nothing further needed from [place] at this time. LMTCBx1 to advise the pt that [facility] is working on this and has all the info they need to process the order. Jim Thorpe Bing, CMA

## 2013-08-27 DIAGNOSIS — H43819 Vitreous degeneration, unspecified eye: Secondary | ICD-10-CM | POA: Diagnosis not present

## 2013-08-28 ENCOUNTER — Other Ambulatory Visit: Payer: Self-pay | Admitting: Gastroenterology

## 2013-08-28 NOTE — Addendum Note (Signed)
Addended by: Vernie Ammons. on: 08/28/2013 11:36 AM   Modules accepted: Orders

## 2013-08-30 ENCOUNTER — Ambulatory Visit (HOSPITAL_COMMUNITY)
Admission: RE | Admit: 2013-08-30 | Discharge: 2013-08-30 | Disposition: A | Discharge status: Home / Self Care | Payer: Medicare Other | Source: Ambulatory Visit | Attending: Gastroenterology | Admitting: Gastroenterology

## 2013-08-30 ENCOUNTER — Encounter (HOSPITAL_COMMUNITY)
Admission: RE | Disposition: A | Discharge status: Home / Self Care | Payer: Self-pay | Source: Ambulatory Visit | Attending: Gastroenterology

## 2013-08-30 ENCOUNTER — Encounter (HOSPITAL_COMMUNITY): Payer: Self-pay | Admitting: *Deleted

## 2013-08-30 DIAGNOSIS — Z8553 Personal history of malignant neoplasm of renal pelvis: Secondary | ICD-10-CM | POA: Insufficient documentation

## 2013-08-30 DIAGNOSIS — K624 Stenosis of anus and rectum: Secondary | ICD-10-CM | POA: Diagnosis not present

## 2013-08-30 DIAGNOSIS — Z905 Acquired absence of kidney: Secondary | ICD-10-CM | POA: Insufficient documentation

## 2013-08-30 DIAGNOSIS — Z7901 Long term (current) use of anticoagulants: Secondary | ICD-10-CM | POA: Insufficient documentation

## 2013-08-30 DIAGNOSIS — Z940 Kidney transplant status: Secondary | ICD-10-CM | POA: Insufficient documentation

## 2013-08-30 DIAGNOSIS — I4891 Unspecified atrial fibrillation: Secondary | ICD-10-CM | POA: Diagnosis not present

## 2013-08-30 DIAGNOSIS — K5090 Crohn's disease, unspecified, without complications: Secondary | ICD-10-CM | POA: Diagnosis not present

## 2013-08-30 DIAGNOSIS — K921 Melena: Secondary | ICD-10-CM | POA: Diagnosis not present

## 2013-08-30 DIAGNOSIS — M329 Systemic lupus erythematosus, unspecified: Secondary | ICD-10-CM | POA: Insufficient documentation

## 2013-08-30 DIAGNOSIS — K861 Other chronic pancreatitis: Secondary | ICD-10-CM | POA: Insufficient documentation

## 2013-08-30 DIAGNOSIS — R197 Diarrhea, unspecified: Secondary | ICD-10-CM | POA: Diagnosis not present

## 2013-08-30 DIAGNOSIS — Z86718 Personal history of other venous thrombosis and embolism: Secondary | ICD-10-CM | POA: Diagnosis not present

## 2013-08-30 HISTORY — PX: COLONOSCOPY: SHX5424

## 2013-08-30 SURGERY — COLONOSCOPY
Anesthesia: Moderate Sedation

## 2013-08-30 MED ORDER — SODIUM CHLORIDE 0.9 % IV SOLN
INTRAVENOUS | Status: DC
  Administered 2013-08-30: 500 mL via INTRAVENOUS

## 2013-08-30 MED ORDER — FENTANYL CITRATE 0.05 MG/ML IJ SOLN
INTRAMUSCULAR | Status: AC
  Filled 2013-08-30: qty 4

## 2013-08-30 MED ORDER — MIDAZOLAM HCL 10 MG/2ML IJ SOLN
INTRAMUSCULAR | Status: AC
  Filled 2013-08-30: qty 4

## 2013-08-30 MED ORDER — FENTANYL CITRATE 0.05 MG/ML IJ SOLN
INTRAMUSCULAR | Status: DC | PRN
  Administered 2013-08-30 (×2): 25 ug via INTRAVENOUS

## 2013-08-30 MED ORDER — MIDAZOLAM HCL 5 MG/5ML IJ SOLN
INTRAMUSCULAR | Status: DC | PRN
  Administered 2013-08-30 (×2): 2 mg via INTRAVENOUS
  Administered 2013-08-30 (×2): 1 mg via INTRAVENOUS

## 2013-08-30 MED ORDER — DIPHENHYDRAMINE HCL 50 MG/ML IJ SOLN
INTRAMUSCULAR | Status: AC
  Filled 2013-08-30: qty 1

## 2013-08-30 NOTE — Op Note (Signed)
[facility] [place], [id number]   COLONOSCOPY PROCEDURE REPORT  PATIENT: [name], [name]  MR#: [id number] BIRTHDATE: [birth year] , 72  yrs. old GENDER: Female ENDOSCOPIST: Laurence Spates, MD REFERRED BY:   Dr Deterding PROCEDURE DATE:  08/30/2013 PROCEDURE: Colonoscopy with Biopsy ASA CLASS:   class III INDICATIONS:  woman with a history of lupus, bilateral nephrectomy due to renal cell cancer, chronic pancreatitis, hemodialysis, renal transplant, Crohn's disease who is a Jehovah's Witness. She has had increasing diarrhea and heme positive stool. She has a history of atrial fibrillation DVT's symbolic events has been on chronic Coumadin which has been on hold for about 4 days. MEDICATIONS:   fentanyl 50 mcg versed 6 mg IV  DESCRIPTION OF PROCEDURE: The procedure had been explained to the patient extensively in the office and consent obtained. 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 Pediatric scope was inserted. The prep was good. She has a large abdominal hernia and pressure was kept over the hernia throughout procedure. 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 We were able to advance to the cecum. Appendiceal orifice was seen as was Celia cecal valve. I was only able to enter the terminal ileum for a few centimeters but it was grossly normal. There was no sign of active Crohn's disease and the cecum, terminal ileum ileocecal valve area. The colonic mucosa was normal throughout. Multiple biopsies were obtained. Her rectum was free of active Crohn's disease or other obvious lesions. It was examined in the forward and in the retroflex view. There were no gross hemorrhoids seen. The scope was withdrawn and the patient tolerated procedure well without obvious complications.     COMPLICATIONS: None  ENDOSCOPIC IMPRESSI 1. Diarrhea/heme positive stool. No  gross signs of Crohn's disease throughout: an immediate ileocecal area. Biopsies obtained. 2. History of Crohn's disease of the rectum  RECOMMENDATIONS: 1. Will resume current medications. We'll start Coumadin back tomorrow at the regular dose. 2. We'll see back in the office in 2 to 3 weeks.    _______________________________ Lorrin MaisLaurence Spates, MD 08/30/2013 11:05 AM CC: Dr Deterding, Dr. Jenkins Rouge    PATIENT NAME:  [name], [name] MR#: [id number]

## 2013-08-30 NOTE — H&P (Signed)
Subjective:   Patient is a 73 y.o. female presents with worsening diarrhea and heme positive stool. She has a multitude of health problems. These include Crohn's disease of the rectal area in previous hemorrhoid ectomy with anal stenosis. She's had a history of bleeding duodenal ulcers requiring surgery and is a Jehovah's Witness. She has lupus, chronic pancreatitis, chronic renal failure with many years of hemodialysis and subsequent renal transplant.. She has had bilateral nephrectomy with renal cell carcinoma, perforated diverticulitis with sigmoid colon resection contemporary ostomy, laparotomy lysis of adhesions, renal transplant, multiple vascular access procedures followed dialysis. She's been found to have DVT, atrial fib requiring chronic anticoagulation. She has chronic esophageal reflux hypertension sleep apnea. Despite all these problems she has remained relatively good shape. It's interesting her recently worsening diarrhea 5 bowel movement today and trace heme positive stool. Her Coumadin has been on hold for 4 days and colonoscopy is plan to look for signs of active Crohn's disease Procedure including risks and benefits discussed in office.  Patient Active Problem List   Diagnosis Date Noted  . Small bowel obstruction, partial 04/24/2012  . Crohn's disease   . Renal disorder   . DVT of leg (deep venous thrombosis) 12/20/2011  . Atrial fibrillation 12/20/2011  . CKD (chronic kidney disease), stage III 12/20/2011  . History of renal transplant 12/20/2011  . HTN (hypertension) 12/20/2011  . Hypothyroidism 12/20/2011  . SLEEP APNEA 09/12/2007   Past Medical History  Diagnosis Date  . Crohn's disease   . Hypertension   . Refusal of blood transfusion for reasons of conscience 12/20/2011    pt is Jehovah's Witness  . Atrial fibrillation   . DVT of leg (deep venous thrombosis) 2012-8./13/2013    "have had one in each leg now"  . Numbness and tingling of foot     bilaterally  .  GERD (gastroesophageal reflux disease)   . Hypothyroidism   . Lower GI bleed 2012  . DVT of lower extremity, bilateral     "have had them in both legs; last one was on the RLE this year" (04/25/2012)  . OSA on CPAP   . Anemia     "when lupus flares up"  . SLE (systemic lupus erythematosus)   . Arthritis     "in my joints"  . Renal disorder     S/P nephrectomy; dialysis; "working fine now" (12/20/11)  . Renal cell carcinoma     Past Surgical History  Procedure Laterality Date  . Cesarean section  1984  . Nephrectomy transplanted organ  2006    bilaterally  . Colectomy  1998    "removal of abscess" (04/25/2012)  . Excisional hemorrhoidectomy    . Cataract extraction w/ intraocular lens  implant, bilateral  2005-2010  . Vena cava filter placement  2012  . Insertion of dialysis catheter  1988-2006    "peritoneal and hemodialysis; had multiple grafts and fistulas; last graft clotted off 2012"  . Cholecystectomy  1995  . Colostomy  1998  . Colostomy reversal  1999    "6 months after placed" (04/25/2012)  . Bone marrow biopsy  1993; 1995  . Abdominal exploration surgery  1995    "found sluggish bone marrow" (04/25/2012)  . Parathyroid implant removal Left     removed parathyroid from neck and implanted in arm    Prescriptions prior to admission  Medication Sig Dispense Refill  . amiodarone (PACERONE) 200 MG tablet Take 100 mg by mouth 2 (two) times daily.       Marland Kitchen  calcitRIOL (ROCALTROL) 0.25 MCG capsule Take 0.25 mcg by mouth every other day.       Marland Kitchen epoetin alfa (EPOGEN,PROCRIT) 95188 UNIT/ML injection Inject 40,000 Units into the skin every 30 (thirty) days.      Marland Kitchen gabapentin (NEURONTIN) 100 MG capsule Take 200 mg by mouth at bedtime.       Marland Kitchen levothyroxine (SYNTHROID, LEVOTHROID) 100 MCG tablet Take 100 mcg by mouth daily.      . metoprolol (LOPRESSOR) 100 MG tablet Take 100 mg by mouth 2 (two) times daily.      . mycophenolate (MYFORTIC) 180 MG EC tablet Take 360 mg by mouth 2  (two) times daily.        Marland Kitchen omeprazole (PRILOSEC) 20 MG capsule Take 20 mg by mouth daily.       Marland Kitchen OVER THE COUNTER MEDICATION Life extension. Skin Restoring photo ceramides with lipowheat   Take once daily      . predniSONE (DELTASONE) 5 MG tablet Take 5 mg by mouth daily.       . promethazine (PHENERGAN) 25 MG tablet Take 1 tablet (25 mg total) by mouth every 6 (six) hours as needed for nausea or vomiting.  30 tablet  0  . tacrolimus (PROGRAF) 1 MG capsule Take 1 mg by mouth 2 (two) times daily. Takes with 5 mg tablet      . tacrolimus (PROGRAF) 5 MG capsule Take 5 mg by mouth 2 (two) times daily. 1 capsule by mouth in AM (along with two 57m capsules for total 749m & 1 capsule in the PM (along with one 3m27mapsule for total dose 6mg66m    . warfarin (COUMADIN) 5 MG tablet Take 2.5 mg by mouth daily. 2.5 mg every other day alternating with 1.5      . NIFEdipine (PROCARDIA XL/ADALAT-CC) 60 MG 24 hr tablet Take 60 mg by mouth daily.        . polyethylene glycol (MIRALAX / GLYCOLAX) packet Take 17 g by mouth 2 (two) times daily as needed. For constipation       Allergies  Allergen Reactions  . Amoxicillin Anaphylaxis  . Ampicillin Anaphylaxis  . Ciprofloxacin Swelling    "of my throat"  . Erythromycin Anaphylaxis  . Penicillins Anaphylaxis  . Sulfa Antibiotics Itching  . Sulfamethoxazole-Trimethoprim Itching  . Tetracycline Anaphylaxis  . Labetalol Nausea And Vomiting    History  Substance Use Topics  . Smoking status: Never Smoker   . Smokeless tobacco: Never Used  . Alcohol Use: No    Family History  Problem Relation Age of Onset  . Heart disease Mother   . Hypertension      runs in family  . Sleep apnea Sister      Objective:   Patient Vitals for the past 8 hrs:  BP Temp Temp src Pulse Resp SpO2 Height Weight  08/30/13 0927 159/86 mmHg 97.5 F (36.4 C) Oral 58 11 99 % 5' 3" (1.6 m) 59.421 kg (131 lb)         See MD Preop evaluation      Assessment:   1.  Diarrhea and positive stools in patient with known Crohn's disease. 2. Multiple health problems as noted above  Plan:   1. Will plan on colonoscopy/sigmoidoscopy and biopsy. Have discuss with patient extensively as well as with Dr DeteJimmy Footmanumadin has been on hold and will proceed with procedure at this time.

## 2013-08-30 NOTE — Discharge Instructions (Addendum)
Resume the coumadin tomorrow Resume previous diet and medications. Repeat colonoscopy in 10 years. Resume yearly stool checks for blood in 3 yearsColonoscopy Care After These instructions give you information on caring for yourself after your procedure. Your doctor may also give you more specific instructions. Call your doctor if you have any problems or questions after your procedure. HOME CARE  Take it easy for the next 24 hours.  Rest.  Walk or use warm packs on your belly (abdomen) if you have belly cramping or gas.  Do not drive for 24 hours.  You may shower.  Do not sign important papers or use machinery for 24 hours.  Drink enough fluids to keep your pee (urine) clear or pale yellow.  Resume your normal diet. Avoid heavy or fried foods.  Avoid alcohol.  Continue taking your normal medicines.  Only take medicine as told by your doctor. Do not take aspirin. If you had growths (polyps) removed:  Do not take aspirin.  Do not drink alcohol for 7 days or as told by your doctor.  Eat a soft diet for 24 hours. GET HELP RIGHT AWAY IF:  You have a fever.  You pass clumps of tissue (blood clots) or fill the toilet with blood.  You have belly pain that gets worse and medicine does not help.  Your belly is puffy (swollen).  You feel sick to your stomach (nauseous) or throw up (vomit). MAKE SURE YOU:  Understand these instructions.  Will watch your condition.  Will get help right away if you are not doing well or get worse. Document Released: 05/28/2010 Document Revised: 07/18/2011 Document Reviewed: 12/31/2012 [facility] Patient Information 2014 [employer].

## 2013-09-02 ENCOUNTER — Encounter (HOSPITAL_COMMUNITY): Payer: Self-pay | Admitting: Gastroenterology

## 2013-09-04 ENCOUNTER — Ambulatory Visit (INDEPENDENT_AMBULATORY_CARE_PROVIDER_SITE_OTHER): Payer: Medicare Other | Admitting: Cardiovascular Disease

## 2013-09-04 ENCOUNTER — Encounter: Payer: Self-pay | Admitting: Cardiovascular Disease

## 2013-09-04 VITALS — BP 122/74 | Ht 63.0 in | Wt 130.0 lb

## 2013-09-04 DIAGNOSIS — I351 Nonrheumatic aortic (valve) insufficiency: Secondary | ICD-10-CM

## 2013-09-04 DIAGNOSIS — I10 Essential (primary) hypertension: Secondary | ICD-10-CM | POA: Diagnosis not present

## 2013-09-04 DIAGNOSIS — I359 Nonrheumatic aortic valve disorder, unspecified: Secondary | ICD-10-CM | POA: Diagnosis not present

## 2013-09-04 DIAGNOSIS — I358 Other nonrheumatic aortic valve disorders: Secondary | ICD-10-CM | POA: Insufficient documentation

## 2013-09-04 DIAGNOSIS — E039 Hypothyroidism, unspecified: Secondary | ICD-10-CM

## 2013-09-04 DIAGNOSIS — I4891 Unspecified atrial fibrillation: Secondary | ICD-10-CM

## 2013-09-04 DIAGNOSIS — K5090 Crohn's disease, unspecified, without complications: Secondary | ICD-10-CM

## 2013-09-04 DIAGNOSIS — Z940 Kidney transplant status: Secondary | ICD-10-CM

## 2013-09-04 NOTE — Assessment & Plan Note (Signed)
Baseline Cr 2.1  Conitnue cellcept and prednisone

## 2013-09-04 NOTE — Progress Notes (Signed)
Patient ID: [name], female   DOB: 28-Sep-1940, 73 y.o.   MRN: [id number] 73 yo with HTN and PAF Previously seen by [facility] and most recently by [facility] Dr Matthew Saras. Reviewed over 35 pages of records. Longstanding PAF and history of DVT On chronic coumadin. CRF with previous dialysis and transplant 2006 Baseline Cr 2.1 She is on low dose amiodarone 50 bid noted labs 1/14 wit suppressed TSH .24 ( LLN .34) She has been maint NSR with no dyspnea chest pain or palpitations No echo's but patient indicates normal LV function. She uses Dr Dederding as primary.   Echo 2/14 [place] mild to moderate AR normal EF   ROS: Denies fever, malais, weight loss, blurry vision, decreased visual acuity, cough, sputum, SOB, hemoptysis, pleuritic pain, palpitaitons, heartburn, abdominal pain, melena, lower extremity edema, claudication, or rash.  All other systems reviewed and negative  General: Affect appropriate Mildly cushingoid  HEENT: normal Neck supple with no adenopathy JVP normal no bruits no thyromegaly Lungs clear with no wheezing and good diaphragmatic motion Heart:  S1/S2 AR  murmur, no rub, gallop or click PMI normal Abdomen: benighn, BS positve, no tenderness, no AAA no bruit.  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 amiodarone (PACERONE) 200 MG tablet Take 100 mg by mouth 2 (two) times daily.       . calcitRIOL (ROCALTROL) 0.25 MCG capsule Take 0.25 mcg by mouth every other day.       Marland Kitchen epoetin alfa (EPOGEN,PROCRIT) 91694 UNIT/ML injection Inject 40,000 Units into the skin every 30 (thirty) days.      Marland Kitchen gabapentin (NEURONTIN) 100 MG capsule Take 200 mg by mouth at bedtime.       Marland Kitchen levothyroxine (SYNTHROID, LEVOTHROID) 100 MCG tablet Take 100 mcg by mouth daily.      . metoprolol (LOPRESSOR) 100 MG tablet Take 100 mg by mouth 2 (two) times  daily.      . mycophenolate (MYFORTIC) 180 MG EC tablet Take 360 mg by mouth 2 (two) times daily.        Marland Kitchen omeprazole (PRILOSEC) 20 MG capsule Take 20 mg by mouth daily.       . polyethylene glycol (MIRALAX / GLYCOLAX) packet Take 17 g by mouth 2 (two) times daily as needed. For constipation      . polyethylene glycol-electrolytes (NULYTELY/GOLYTELY) 420 G solution       . predniSONE (DELTASONE) 5 MG tablet Take 5 mg by mouth daily.       . tacrolimus (PROGRAF) 1 MG capsule Take 1 mg by mouth 2 (two) times daily. Takes with 5 mg tablet      . warfarin (COUMADIN) 5 MG tablet Take 2.5 mg by mouth daily. 2.5 mg every other day alternating with 1.5       No current facility-administered medications for this visit.    Allergies  Amoxicillin; Ampicillin; Ciprofloxacin; Erythromycin; Penicillins; Sulfa antibiotics; Sulfamethoxazole-trimethoprim; Tetracycline; and Labetalol  Electrocardiogram:  NSR RBBB  Today NSR RBBB rate 77 no change from 2014  Assessment and Plan

## 2013-09-04 NOTE — Assessment & Plan Note (Signed)
Recent colonoscopy by Dr Oletta Lamas ? cdif  Finished course of Flagyl  F/U GI

## 2013-09-04 NOTE — Assessment & Plan Note (Signed)
Maint NSR with no palpitations on amiodarone  F/U LFTls and TSH with Dr Jimmy Footman  Continue anticoagulation with INR checks by renal as well

## 2013-09-04 NOTE — Patient Instructions (Signed)
Your physician wants you to follow-up in: [place] will receive a reminder letter in the mail two months in advance. If you don't receive a letter, please call our office to schedule the follow-up appointment. Your physician recommends that you continue on your current medications as directed. Please refer to the Current Medication list given to you today.  Your physician has requested that you have an echocardiogram. Echocardiography is a painless test that uses sound waves to create images of your heart. It provides your doctor with information about the size and shape of your heart and how well your heart's chambers and valves are working. This procedure takes approximately one hour. There are no restrictions for this procedure.

## 2013-09-04 NOTE — Assessment & Plan Note (Signed)
F/U echo History of mild to moderate AR

## 2013-09-04 NOTE — Assessment & Plan Note (Signed)
Needs TSH by primary last time mildly suppressed

## 2013-09-04 NOTE — Assessment & Plan Note (Signed)
Well controlled.  Continue current medications and low sodium Dash type diet.    

## 2013-09-06 ENCOUNTER — Telehealth: Payer: Self-pay | Admitting: Internal Medicine

## 2013-09-06 DIAGNOSIS — G4733 Obstructive sleep apnea (adult) (pediatric): Secondary | ICD-10-CM

## 2013-09-06 NOTE — Telephone Encounter (Signed)
CY there is not a sleep study in the pts chart.  [facility] stated that the OV note could be sent to them but they are not sure that they will be able to help the pt without the sleep study.  Would you like to set a sleep study up for the pt?  Please advise. thanks

## 2013-09-06 NOTE — Telephone Encounter (Signed)
Ok to send diagnostic sleep study and last ov as requested

## 2013-09-06 NOTE — Telephone Encounter (Signed)
Called and spoke with [facility] and they stated that they will need a copy of the pts baseline sleep study and the last ov notes faxed to them so they can get the pt the supplies for her cpap.  CY please advise. thanks

## 2013-09-08 DIAGNOSIS — J309 Allergic rhinitis, unspecified: Secondary | ICD-10-CM | POA: Insufficient documentation

## 2013-09-08 NOTE — Assessment & Plan Note (Signed)
Plan-try taking a 12 hour antihistamine just in the morning to avoid overdrying at night. Try Biotene

## 2013-09-08 NOTE — Assessment & Plan Note (Signed)
Good compliance and control. Plan-get [facility] did check machine for service update, Biotene

## 2013-09-09 DIAGNOSIS — I129 Hypertensive chronic kidney disease with stage 1 through stage 4 chronic kidney disease, or unspecified chronic kidney disease: Secondary | ICD-10-CM | POA: Diagnosis not present

## 2013-09-09 DIAGNOSIS — N2581 Secondary hyperparathyroidism of renal origin: Secondary | ICD-10-CM | POA: Diagnosis not present

## 2013-09-09 DIAGNOSIS — N184 Chronic kidney disease, stage 4 (severe): Secondary | ICD-10-CM | POA: Diagnosis not present

## 2013-09-09 DIAGNOSIS — N2589 Other disorders resulting from impaired renal tubular function: Secondary | ICD-10-CM | POA: Diagnosis not present

## 2013-09-09 DIAGNOSIS — Z940 Kidney transplant status: Secondary | ICD-10-CM | POA: Diagnosis not present

## 2013-09-09 DIAGNOSIS — I82409 Acute embolism and thrombosis of unspecified deep veins of unspecified lower extremity: Secondary | ICD-10-CM | POA: Diagnosis not present

## 2013-09-09 DIAGNOSIS — E038 Other specified hypothyroidism: Secondary | ICD-10-CM | POA: Diagnosis not present

## 2013-09-09 DIAGNOSIS — D631 Anemia in chronic kidney disease: Secondary | ICD-10-CM | POA: Diagnosis not present

## 2013-09-11 NOTE — Telephone Encounter (Signed)
CY, I spoke with Huey Romans about this patient-she has been with them since 1999 and had [employer] at the time she started CPAP with [facility]-back then a sleep study on file was not needed. Since then she has changed to Medicare and they require to have a sleep study on file. Pt's sleep study was done around 1999 or before and will not be able to use that. Please advise if okay to go ahead and get a new sleep study on patient so she can continue her CPAP therapy. Thanks.

## 2013-09-11 NOTE — Telephone Encounter (Signed)
Ok please schedule split protocol NPSG  For dx OSA

## 2013-09-11 NOTE — Telephone Encounter (Signed)
Called spoke with pt. agree'd to have study done. Ordered placed. Nothing further needed

## 2013-09-12 ENCOUNTER — Encounter (HOSPITAL_COMMUNITY): Payer: Medicare Other

## 2013-09-13 DIAGNOSIS — T8610 Unspecified complication of kidney transplant: Secondary | ICD-10-CM | POA: Diagnosis not present

## 2013-09-13 DIAGNOSIS — R197 Diarrhea, unspecified: Secondary | ICD-10-CM | POA: Diagnosis not present

## 2013-09-13 DIAGNOSIS — K624 Stenosis of anus and rectum: Secondary | ICD-10-CM | POA: Diagnosis not present

## 2013-09-13 DIAGNOSIS — K861 Other chronic pancreatitis: Secondary | ICD-10-CM | POA: Diagnosis not present

## 2013-09-13 DIAGNOSIS — Z7901 Long term (current) use of anticoagulants: Secondary | ICD-10-CM | POA: Diagnosis not present

## 2013-09-13 DIAGNOSIS — N189 Chronic kidney disease, unspecified: Secondary | ICD-10-CM | POA: Diagnosis not present

## 2013-09-13 DIAGNOSIS — I4891 Unspecified atrial fibrillation: Secondary | ICD-10-CM | POA: Diagnosis not present

## 2013-09-13 DIAGNOSIS — K5010 Crohn's disease of large intestine without complications: Secondary | ICD-10-CM | POA: Diagnosis not present

## 2013-09-16 DIAGNOSIS — I82409 Acute embolism and thrombosis of unspecified deep veins of unspecified lower extremity: Secondary | ICD-10-CM | POA: Diagnosis not present

## 2013-09-23 ENCOUNTER — Ambulatory Visit (HOSPITAL_COMMUNITY): Payer: Medicare Other | Attending: Cardiovascular Disease | Admitting: Radiology

## 2013-09-23 DIAGNOSIS — I351 Nonrheumatic aortic (valve) insufficiency: Secondary | ICD-10-CM

## 2013-09-23 DIAGNOSIS — I4891 Unspecified atrial fibrillation: Secondary | ICD-10-CM | POA: Diagnosis not present

## 2013-09-23 DIAGNOSIS — N183 Chronic kidney disease, stage 3 unspecified: Secondary | ICD-10-CM

## 2013-09-23 DIAGNOSIS — I359 Nonrheumatic aortic valve disorder, unspecified: Secondary | ICD-10-CM | POA: Diagnosis not present

## 2013-09-23 DIAGNOSIS — I358 Other nonrheumatic aortic valve disorders: Secondary | ICD-10-CM

## 2013-09-23 NOTE — Progress Notes (Signed)
Echocardiogram performed.  

## 2013-09-26 ENCOUNTER — Encounter (HOSPITAL_COMMUNITY)
Admission: RE | Admit: 2013-09-26 | Discharge: 2013-09-26 | Disposition: A | Discharge status: Home / Self Care | Payer: Medicare Other | Source: Ambulatory Visit | Attending: Nephrology | Admitting: Nephrology

## 2013-09-26 DIAGNOSIS — D649 Anemia, unspecified: Secondary | ICD-10-CM | POA: Diagnosis not present

## 2013-09-26 LAB — RENAL FUNCTION PANEL
Albumin: 3.8 g/dL (ref 3.5–5.2)
BUN: 52 mg/dL — ABNORMAL HIGH (ref 6–23)
CALCIUM: 9.7 mg/dL (ref 8.4–10.5)
CHLORIDE: 105 meq/L (ref 96–112)
CO2: 22 meq/L (ref 19–32)
Creatinine, Ser: 2.65 mg/dL — ABNORMAL HIGH (ref 0.50–1.10)
GFR calc Af Amer: 20 mL/min — ABNORMAL LOW (ref >90)
GFR, EST NON AFRICAN AMERICAN: 17 mL/min — AB (ref >90)
GLUCOSE: 93 mg/dL (ref 70–99)
Phosphorus: 3.8 mg/dL (ref 2.3–4.6)
Potassium: 4.5 mEq/L (ref 3.7–5.3)
Sodium: 142 mEq/L (ref 137–147)

## 2013-09-26 LAB — IRON AND TIBC
Iron: 107 ug/dL (ref 42–135)
SATURATION RATIOS: 55 % (ref 20–55)
TIBC: 193 ug/dL — AB (ref 250–470)
UIBC: 86 ug/dL — AB (ref 125–400)

## 2013-09-26 LAB — FERRITIN: Ferritin: 1088 ng/mL — ABNORMAL HIGH (ref 10–291)

## 2013-09-26 LAB — POCT HEMOGLOBIN-HEMACUE: Hemoglobin: 9.6 g/dL — ABNORMAL LOW (ref 12.0–15.0)

## 2013-09-26 MED ORDER — EPOETIN ALFA 40000 UNIT/ML IJ SOLN
INTRAMUSCULAR | Status: AC
  Administered 2013-09-26: 40000 [IU]
  Filled 2013-09-26: qty 1

## 2013-09-26 MED ORDER — EPOETIN ALFA 10000 UNIT/ML IJ SOLN: 40000.0000 [IU] | INTRAMUSCULAR | Status: DC

## 2013-10-02 DIAGNOSIS — I82409 Acute embolism and thrombosis of unspecified deep veins of unspecified lower extremity: Secondary | ICD-10-CM | POA: Diagnosis not present

## 2013-10-14 DIAGNOSIS — I82409 Acute embolism and thrombosis of unspecified deep veins of unspecified lower extremity: Secondary | ICD-10-CM | POA: Diagnosis not present

## 2013-10-15 ENCOUNTER — Ambulatory Visit (HOSPITAL_BASED_OUTPATIENT_CLINIC_OR_DEPARTMENT_OTHER): Payer: Medicare Other | Attending: Internal Medicine

## 2013-10-15 VITALS — Ht 63.0 in | Wt 130.0 lb

## 2013-10-15 DIAGNOSIS — R0609 Other forms of dyspnea: Secondary | ICD-10-CM | POA: Diagnosis not present

## 2013-10-15 DIAGNOSIS — Z9989 Dependence on other enabling machines and devices: Secondary | ICD-10-CM

## 2013-10-15 DIAGNOSIS — G4733 Obstructive sleep apnea (adult) (pediatric): Secondary | ICD-10-CM | POA: Insufficient documentation

## 2013-10-15 DIAGNOSIS — R0989 Other specified symptoms and signs involving the circulatory and respiratory systems: Secondary | ICD-10-CM | POA: Insufficient documentation

## 2013-10-16 ENCOUNTER — Other Ambulatory Visit (HOSPITAL_COMMUNITY): Payer: Self-pay | Admitting: *Deleted

## 2013-10-17 ENCOUNTER — Encounter (HOSPITAL_COMMUNITY)
Admission: RE | Admit: 2013-10-17 | Discharge: 2013-10-17 | Disposition: A | Discharge status: Home / Self Care | Payer: Medicare Other | Source: Ambulatory Visit | Attending: Nephrology | Admitting: Nephrology

## 2013-10-17 DIAGNOSIS — D649 Anemia, unspecified: Secondary | ICD-10-CM | POA: Insufficient documentation

## 2013-10-17 MED ORDER — EPOETIN ALFA 10000 UNIT/ML IJ SOLN: 40000.0000 [IU] | INTRAMUSCULAR | Status: DC

## 2013-10-17 MED ORDER — EPOETIN ALFA 40000 UNIT/ML IJ SOLN
INTRAMUSCULAR | Status: AC
  Administered 2013-10-17: 40000 [IU] via SUBCUTANEOUS
  Filled 2013-10-17: qty 1

## 2013-10-18 LAB — POCT HEMOGLOBIN-HEMACUE: Hemoglobin: 11.6 g/dL — ABNORMAL LOW (ref 12.0–15.0)

## 2013-10-19 DIAGNOSIS — G4730 Sleep apnea, unspecified: Secondary | ICD-10-CM

## 2013-10-19 DIAGNOSIS — G4710 Hypersomnia, unspecified: Secondary | ICD-10-CM | POA: Diagnosis not present

## 2013-10-19 NOTE — Sleep Study (Signed)
   NAME: [name] DATE OF BIRTH:  09-26-1940 MEDICAL RECORD NUMBER [id number]  LOCATION: [facility]  PHYSICIAN: Myreon Wimer D  DATE OF STUDY: 10/15/2013  SLEEP STUDY TYPE: Nocturnal Polysomnogram               REFERRING PHYSICIAN: Baird Lyons D, MD  INDICATION FOR STUDY: Hypersomnia with sleep apnea  EPWORTH SLEEPINESS SCORE:   10/24  HEIGHT: 5' 3"  (160 cm)  WEIGHT: 130 lb (58.968 kg)    Body mass index is 23.03 kg/(m^2).  NECK SIZE: 14.5 in.  MEDICATIONS: Charted for review  SLEEP ARCHITECTURE: Split study protocol. During the diagnostic phase, total sleep time 120 minutes with sleep efficiency 61.7%. Stage I was 46.3%, stage II 53.8%, stage III and REM were absent. Sleep latency 13.5 minutes, awake after sleep onset 61 minutes, arousal index 29.5, bedtime medication: Gabapentin, Myfortic, metoprolol, Prograf  RESPIRATORY DATA: Apnea hypopnea index (AHI) 58.5 per hour. 117 total events scored including 59 obstructive apneas, 1 central apnea, 57 hypopneas. Events were not positional. CPAP titrated to 10 CWP, AHI 0 per hour. She wore a small/medium nasal mask with heated humidifier.  OXYGEN DATA: Moderately loud snoring before CPAP with oxygen desaturation to a nadir of 84% on room air. With CPAP control, snoring was prevented and mean oxygen saturation of 96.1% on room air.  CARDIAC DATA: Normal cardiac rhythm  MOVEMENT/PARASOMNIA: A few incidental limb jerks were noted with little effect on sleep. Bathroom x1.  IMPRESSION/ RECOMMENDATION:   1) Severe obstructive sleep apnea/hypoxia syndrome, AHI 58.5 per hour with non-positional events. Moderately loud snoring with oxygen desaturation to a nadir of 84% on room air. 2) Successful CPAP titration to 10 CWP, AHI 0 per hour. She wore a small/medium Respironics PICO Nasal Mask with heated humidifier and an EPR of 3. Snoring was prevented and mean oxygen saturation of 96.1% on room air with CPAP and    Signed Baird Lyons M.D. Deneise Lever Diplomate, American Board of Sleep Medicine  ELECTRONICALLY SIGNED ON:  10/19/2013, 11:06 AM [place] PH: [phone]   FX: [phone] [place]

## 2013-10-24 ENCOUNTER — Encounter (HOSPITAL_COMMUNITY): Payer: Medicare Other

## 2013-10-24 DIAGNOSIS — I82409 Acute embolism and thrombosis of unspecified deep veins of unspecified lower extremity: Secondary | ICD-10-CM | POA: Diagnosis not present

## 2013-10-27 ENCOUNTER — Encounter (HOSPITAL_BASED_OUTPATIENT_CLINIC_OR_DEPARTMENT_OTHER): Payer: Medicare Other

## 2013-11-06 ENCOUNTER — Telehealth: Payer: Self-pay | Admitting: Internal Medicine

## 2013-11-06 DIAGNOSIS — G4733 Obstructive sleep apnea (adult) (pediatric): Secondary | ICD-10-CM

## 2013-11-06 NOTE — Telephone Encounter (Signed)
Pt calling to check on the status of her sleep study results from 10/16/13.  Pt is aware that these have not been sent over to [facility] and that he does read these on saturdays.  Pt also wanted to know when the order would be sent in to [facility] for her cpap and supplies.  Pt is aware that we will call her back once this has been read and supply order has been sent.  CY please advise. Thanks  Last ov--08/12/13 Next ov--08/13/13  Allergies  Allergen Reactions  . Amoxicillin Anaphylaxis  . Ampicillin Anaphylaxis  . Ciprofloxacin Swelling    "of my throat"  . Erythromycin Anaphylaxis  . Penicillins Anaphylaxis  . Sulfa Antibiotics Itching  . Sulfamethoxazole-Trimethoprim Itching  . Tetracycline Anaphylaxis  . Labetalol Nausea And Vomiting    Current Outpatient Prescriptions on File Prior to Visit  Medication Sig Dispense Refill  . amiodarone (PACERONE) 200 MG tablet Take 100 mg by mouth 2 (two) times daily.       . calcitRIOL (ROCALTROL) 0.25 MCG capsule Take 0.25 mcg by mouth every other day.       Marland Kitchen epoetin alfa (EPOGEN,PROCRIT) 08144 UNIT/ML injection Inject 40,000 Units into the skin every 30 (thirty) days.      Marland Kitchen gabapentin (NEURONTIN) 100 MG capsule Take 200 mg by mouth at bedtime.       Marland Kitchen levothyroxine (SYNTHROID, LEVOTHROID) 100 MCG tablet Take 100 mcg by mouth daily.      . metoprolol (LOPRESSOR) 100 MG tablet Take 100 mg by mouth 2 (two) times daily.      . mycophenolate (MYFORTIC) 180 MG EC tablet Take 360 mg by mouth 2 (two) times daily.        Marland Kitchen omeprazole (PRILOSEC) 20 MG capsule Take 20 mg by mouth daily.       . polyethylene glycol (MIRALAX / GLYCOLAX) packet Take 17 g by mouth 2 (two) times daily as needed. For constipation      . polyethylene glycol-electrolytes (NULYTELY/GOLYTELY) 420 G solution       . predniSONE (DELTASONE) 5 MG tablet Take 5 mg by mouth daily.       . tacrolimus (PROGRAF) 1 MG capsule Take 1 mg by mouth 2 (two) times daily. Takes with 5 mg tablet      . warfarin  (COUMADIN) 5 MG tablet Take 2.5 mg by mouth daily. 2.5 mg every other day alternating with 1.5       No current facility-administered medications on file prior to visit.

## 2013-11-07 ENCOUNTER — Encounter (HOSPITAL_COMMUNITY)
Admission: RE | Admit: 2013-11-07 | Discharge: 2013-11-07 | Disposition: A | Discharge status: Home / Self Care | Payer: Medicare Other | Source: Ambulatory Visit | Attending: Nephrology | Admitting: Nephrology

## 2013-11-07 DIAGNOSIS — D649 Anemia, unspecified: Secondary | ICD-10-CM | POA: Insufficient documentation

## 2013-11-07 LAB — RENAL FUNCTION PANEL
Albumin: 3.8 g/dL (ref 3.5–5.2)
Anion gap: 18 — ABNORMAL HIGH (ref 5–15)
BUN: 44 mg/dL — ABNORMAL HIGH (ref 6–23)
CO2: 21 meq/L (ref 19–32)
Calcium: 9.5 mg/dL (ref 8.4–10.5)
Chloride: 98 mEq/L (ref 96–112)
Creatinine, Ser: 2.14 mg/dL — ABNORMAL HIGH (ref 0.50–1.10)
GFR calc non Af Amer: 22 mL/min — ABNORMAL LOW (ref >90)
GFR, EST AFRICAN AMERICAN: 26 mL/min — AB (ref >90)
GLUCOSE: 108 mg/dL — AB (ref 70–99)
Phosphorus: 3.5 mg/dL (ref 2.3–4.6)
Potassium: 4.6 mEq/L (ref 3.7–5.3)
Sodium: 137 mEq/L (ref 137–147)

## 2013-11-07 LAB — IRON AND TIBC
Iron: 90 ug/dL (ref 42–135)
SATURATION RATIOS: 43 % (ref 20–55)
TIBC: 208 ug/dL — AB (ref 250–470)
UIBC: 118 ug/dL — ABNORMAL LOW (ref 125–400)

## 2013-11-07 LAB — PROTIME-INR
INR: 2.44 — ABNORMAL HIGH (ref 0.00–1.49)
Prothrombin Time: 26.5 seconds — ABNORMAL HIGH (ref 11.6–15.2)

## 2013-11-07 LAB — FERRITIN: FERRITIN: 836 ng/mL — AB (ref 10–291)

## 2013-11-07 LAB — POCT HEMOGLOBIN-HEMACUE: Hemoglobin: 10.8 g/dL — ABNORMAL LOW (ref 12.0–15.0)

## 2013-11-07 MED ORDER — EPOETIN ALFA 40000 UNIT/ML IJ SOLN
INTRAMUSCULAR | Status: AC
  Administered 2013-11-07: 40000 [IU] via SUBCUTANEOUS
  Filled 2013-11-07: qty 1

## 2013-11-07 MED ORDER — EPOETIN ALFA 10000 UNIT/ML IJ SOLN: 40000.0000 [IU] | INTRAMUSCULAR | Status: DC

## 2013-11-18 NOTE — Telephone Encounter (Signed)
Sleep study confirmed severe obstructive sleep apnea, showing that she was stopping breathing 58.5 times/ hr.  CPAP at pressure of 10 controlled her well.  Recommend Order- DME [facility] to restart CPAP, new machine 10 cwp, mask of choice, heated humidifier, supplies  for dx OSA, based on sleep study done 10/15/13.

## 2013-11-18 NOTE — Telephone Encounter (Signed)
I spoke with patient about results and she verbalized understanding and had no questions Order placed 

## 2013-11-26 ENCOUNTER — Telehealth: Payer: Self-pay | Admitting: Internal Medicine

## 2013-11-26 NOTE — Telephone Encounter (Signed)
Pt returned call.  Holly D Pryor ° °

## 2013-11-26 NOTE — Telephone Encounter (Signed)
[facility] PT line rang numerous times then received a busy signal. Tried x 2 and received same thing [facility]

## 2013-11-27 NOTE — Telephone Encounter (Signed)
Order was placed for pt 11/18/13.  Recommend Order- DME [facility] to restart CPAP, new machine 10 cwp, mask of choice, heated humidifier, supplies for dx OSA, based on sleep study done 10/15/13 [facility] ---  Called spoke with pt. She reports she has medicare and [employer]. She wants to know if she would be able to use [facility]. Please advise PCC's thanks

## 2013-11-28 ENCOUNTER — Inpatient Hospital Stay (HOSPITAL_COMMUNITY): Admission: RE | Admit: 2013-11-28 | Payer: Medicare Other | Source: Ambulatory Visit

## 2013-11-28 NOTE — Telephone Encounter (Signed)
Order sent to [facility] for pt's cpap set up Joellen [place]

## 2013-12-02 ENCOUNTER — Telehealth: Payer: Self-pay | Admitting: Internal Medicine

## 2013-12-02 DIAGNOSIS — G4730 Sleep apnea, unspecified: Principal | ICD-10-CM

## 2013-12-02 DIAGNOSIS — G4710 Hypersomnia, unspecified: Secondary | ICD-10-CM

## 2013-12-02 NOTE — Telephone Encounter (Signed)
Called and spoke with betsy from [facility]  She stated that the pt is not eligible for a new machine until June 2016.  [facility] can supply her with supplies of her current machine and do pressure changes if needed.   Pt does need supplies of her current machine but the pts husband was unsure of the machine she has.  Will place order for this.  Nothing further is needed.

## 2013-12-05 ENCOUNTER — Encounter (HOSPITAL_COMMUNITY)
Admission: RE | Admit: 2013-12-05 | Discharge: 2013-12-05 | Disposition: A | Discharge status: Home / Self Care | Payer: Medicare Other | Source: Ambulatory Visit | Attending: Nephrology | Admitting: Nephrology

## 2013-12-05 DIAGNOSIS — D649 Anemia, unspecified: Secondary | ICD-10-CM | POA: Diagnosis not present

## 2013-12-05 LAB — POCT HEMOGLOBIN-HEMACUE: Hemoglobin: 10.8 g/dL — ABNORMAL LOW (ref 12.0–15.0)

## 2013-12-05 MED ORDER — EPOETIN ALFA 40000 UNIT/ML IJ SOLN
INTRAMUSCULAR | Status: AC
  Administered 2013-12-05: 40000 [IU]
  Filled 2013-12-05: qty 1

## 2013-12-05 MED ORDER — EPOETIN ALFA 10000 UNIT/ML IJ SOLN: 40000.0000 [IU] | INTRAMUSCULAR | Status: DC

## 2013-12-24 DIAGNOSIS — H04129 Dry eye syndrome of unspecified lacrimal gland: Secondary | ICD-10-CM | POA: Diagnosis not present

## 2013-12-26 ENCOUNTER — Inpatient Hospital Stay (HOSPITAL_COMMUNITY): Admission: RE | Admit: 2013-12-26 | Payer: Medicare Other | Source: Ambulatory Visit

## 2014-01-03 DIAGNOSIS — I82409 Acute embolism and thrombosis of unspecified deep veins of unspecified lower extremity: Secondary | ICD-10-CM | POA: Diagnosis not present

## 2014-01-03 DIAGNOSIS — E785 Hyperlipidemia, unspecified: Secondary | ICD-10-CM | POA: Diagnosis not present

## 2014-01-03 DIAGNOSIS — N184 Chronic kidney disease, stage 4 (severe): Secondary | ICD-10-CM | POA: Diagnosis not present

## 2014-01-03 DIAGNOSIS — Z940 Kidney transplant status: Secondary | ICD-10-CM | POA: Diagnosis not present

## 2014-01-03 DIAGNOSIS — D631 Anemia in chronic kidney disease: Secondary | ICD-10-CM | POA: Diagnosis not present

## 2014-01-03 DIAGNOSIS — I129 Hypertensive chronic kidney disease with stage 1 through stage 4 chronic kidney disease, or unspecified chronic kidney disease: Secondary | ICD-10-CM | POA: Diagnosis not present

## 2014-01-17 DIAGNOSIS — N184 Chronic kidney disease, stage 4 (severe): Secondary | ICD-10-CM | POA: Diagnosis not present

## 2014-01-17 DIAGNOSIS — I82409 Acute embolism and thrombosis of unspecified deep veins of unspecified lower extremity: Secondary | ICD-10-CM | POA: Diagnosis not present

## 2014-01-17 DIAGNOSIS — Z940 Kidney transplant status: Secondary | ICD-10-CM | POA: Diagnosis not present

## 2014-01-23 ENCOUNTER — Encounter (HOSPITAL_COMMUNITY)
Admission: RE | Admit: 2014-01-23 | Discharge: 2014-01-23 | Disposition: A | Discharge status: Home / Self Care | Payer: Medicare Other | Source: Ambulatory Visit | Attending: Nephrology | Admitting: Nephrology

## 2014-01-23 DIAGNOSIS — D649 Anemia, unspecified: Secondary | ICD-10-CM | POA: Insufficient documentation

## 2014-01-23 LAB — RENAL FUNCTION PANEL
Albumin: 3.7 g/dL (ref 3.5–5.2)
Anion gap: 14 (ref 5–15)
BUN: 43 mg/dL — AB (ref 6–23)
CO2: 25 meq/L (ref 19–32)
CREATININE: 2.27 mg/dL — AB (ref 0.50–1.10)
Calcium: 9.6 mg/dL (ref 8.4–10.5)
Chloride: 102 mEq/L (ref 96–112)
GFR calc Af Amer: 24 mL/min — ABNORMAL LOW (ref >90)
GFR, EST NON AFRICAN AMERICAN: 21 mL/min — AB (ref >90)
Glucose, Bld: 92 mg/dL (ref 70–99)
POTASSIUM: 4.5 meq/L (ref 3.7–5.3)
Phosphorus: 3.5 mg/dL (ref 2.3–4.6)
Sodium: 141 mEq/L (ref 137–147)

## 2014-01-23 LAB — POCT HEMOGLOBIN-HEMACUE: HEMOGLOBIN: 10.9 g/dL — AB (ref 12.0–15.0)

## 2014-01-23 LAB — IRON AND TIBC
IRON: 91 ug/dL (ref 42–135)
Saturation Ratios: 43 % (ref 20–55)
TIBC: 210 ug/dL — AB (ref 250–470)
UIBC: 119 ug/dL — ABNORMAL LOW (ref 125–400)

## 2014-01-23 MED ORDER — EPOETIN ALFA 40000 UNIT/ML IJ SOLN
INTRAMUSCULAR | Status: AC
  Administered 2014-01-23: 40000 [IU] via SUBCUTANEOUS
  Filled 2014-01-23: qty 1

## 2014-01-23 MED ORDER — EPOETIN ALFA 10000 UNIT/ML IJ SOLN: 40000.0000 [IU] | INTRAMUSCULAR | Status: DC

## 2014-01-24 LAB — FERRITIN: FERRITIN: 1053 ng/mL — AB (ref 10–291)

## 2014-01-25 LAB — URINE CULTURE: Colony Count: >=100000

## 2014-01-26 LAB — TACROLIMUS LEVEL: Tacrolimus (FK506) - LabCorp: 5.2 ng/mL

## 2014-02-13 ENCOUNTER — Encounter (HOSPITAL_COMMUNITY)
Admission: RE | Admit: 2014-02-13 | Discharge: 2014-02-13 | Disposition: A | Discharge status: Home / Self Care | Payer: Medicare Other | Source: Ambulatory Visit | Attending: Nephrology | Admitting: Nephrology

## 2014-02-13 DIAGNOSIS — D631 Anemia in chronic kidney disease: Secondary | ICD-10-CM | POA: Insufficient documentation

## 2014-02-13 DIAGNOSIS — N183 Chronic kidney disease, stage 3 (moderate): Secondary | ICD-10-CM | POA: Diagnosis not present

## 2014-02-13 LAB — POCT HEMOGLOBIN-HEMACUE: Hemoglobin: 10.4 g/dL — ABNORMAL LOW (ref 12.0–15.0)

## 2014-02-13 MED ORDER — EPOETIN ALFA 10000 UNIT/ML IJ SOLN
40000.0000 [IU] | INTRAMUSCULAR | Status: DC
  Administered 2014-02-13: 40000 [IU] via SUBCUTANEOUS

## 2014-02-13 MED ORDER — EPOETIN ALFA 40000 UNIT/ML IJ SOLN
INTRAMUSCULAR | Status: AC
  Filled 2014-02-13: qty 1

## 2014-03-04 ENCOUNTER — Inpatient Hospital Stay (HOSPITAL_BASED_OUTPATIENT_CLINIC_OR_DEPARTMENT_OTHER)
Admission: EM | Admit: 2014-03-04 | Discharge: 2014-03-07 | DRG: 389 | Disposition: A | Discharge status: Home / Self Care | Payer: Medicare Other | Attending: Internal Medicine | Admitting: Internal Medicine

## 2014-03-04 ENCOUNTER — Emergency Department (HOSPITAL_BASED_OUTPATIENT_CLINIC_OR_DEPARTMENT_OTHER): Payer: Medicare Other

## 2014-03-04 ENCOUNTER — Encounter (HOSPITAL_BASED_OUTPATIENT_CLINIC_OR_DEPARTMENT_OTHER): Payer: Self-pay | Admitting: Emergency Medicine

## 2014-03-04 DIAGNOSIS — Z85528 Personal history of other malignant neoplasm of kidney: Secondary | ICD-10-CM

## 2014-03-04 DIAGNOSIS — K5660 Unspecified intestinal obstruction: Secondary | ICD-10-CM | POA: Diagnosis not present

## 2014-03-04 DIAGNOSIS — R112 Nausea with vomiting, unspecified: Secondary | ICD-10-CM | POA: Diagnosis not present

## 2014-03-04 DIAGNOSIS — Z940 Kidney transplant status: Secondary | ICD-10-CM | POA: Diagnosis not present

## 2014-03-04 DIAGNOSIS — E039 Hypothyroidism, unspecified: Secondary | ICD-10-CM | POA: Diagnosis present

## 2014-03-04 DIAGNOSIS — M329 Systemic lupus erythematosus, unspecified: Secondary | ICD-10-CM | POA: Diagnosis present

## 2014-03-04 DIAGNOSIS — N179 Acute kidney failure, unspecified: Secondary | ICD-10-CM | POA: Diagnosis not present

## 2014-03-04 DIAGNOSIS — R1013 Epigastric pain: Secondary | ICD-10-CM | POA: Diagnosis not present

## 2014-03-04 DIAGNOSIS — R109 Unspecified abdominal pain: Secondary | ICD-10-CM | POA: Diagnosis not present

## 2014-03-04 DIAGNOSIS — K50912 Crohn's disease, unspecified, with intestinal obstruction: Secondary | ICD-10-CM

## 2014-03-04 DIAGNOSIS — I4891 Unspecified atrial fibrillation: Secondary | ICD-10-CM | POA: Diagnosis present

## 2014-03-04 DIAGNOSIS — Z86718 Personal history of other venous thrombosis and embolism: Secondary | ICD-10-CM | POA: Diagnosis not present

## 2014-03-04 DIAGNOSIS — I482 Chronic atrial fibrillation, unspecified: Secondary | ICD-10-CM

## 2014-03-04 DIAGNOSIS — Z905 Acquired absence of kidney: Secondary | ICD-10-CM | POA: Diagnosis present

## 2014-03-04 DIAGNOSIS — Z531 Procedure and treatment not carried out because of patient's decision for reasons of belief and group pressure: Secondary | ICD-10-CM | POA: Diagnosis present

## 2014-03-04 DIAGNOSIS — N183 Chronic kidney disease, stage 3 unspecified: Secondary | ICD-10-CM

## 2014-03-04 DIAGNOSIS — Z7952 Long term (current) use of systemic steroids: Secondary | ICD-10-CM

## 2014-03-04 DIAGNOSIS — K219 Gastro-esophageal reflux disease without esophagitis: Secondary | ICD-10-CM | POA: Diagnosis present

## 2014-03-04 DIAGNOSIS — K5090 Crohn's disease, unspecified, without complications: Secondary | ICD-10-CM | POA: Diagnosis present

## 2014-03-04 DIAGNOSIS — Z7901 Long term (current) use of anticoagulants: Secondary | ICD-10-CM

## 2014-03-04 DIAGNOSIS — G4733 Obstructive sleep apnea (adult) (pediatric): Secondary | ICD-10-CM | POA: Diagnosis present

## 2014-03-04 DIAGNOSIS — K56609 Unspecified intestinal obstruction, unspecified as to partial versus complete obstruction: Secondary | ICD-10-CM | POA: Diagnosis present

## 2014-03-04 DIAGNOSIS — I10 Essential (primary) hypertension: Secondary | ICD-10-CM | POA: Diagnosis not present

## 2014-03-04 DIAGNOSIS — K5669 Other intestinal obstruction: Secondary | ICD-10-CM | POA: Diagnosis not present

## 2014-03-04 LAB — LIPASE, BLOOD: LIPASE: 33 U/L (ref 11–59)

## 2014-03-04 LAB — COMPREHENSIVE METABOLIC PANEL
ALT: 11 U/L (ref 0–35)
ANION GAP: 15 (ref 5–15)
AST: 19 U/L (ref 0–37)
Albumin: 4 g/dL (ref 3.5–5.2)
Alkaline Phosphatase: 127 U/L — ABNORMAL HIGH (ref 39–117)
BILIRUBIN TOTAL: 0.3 mg/dL (ref 0.3–1.2)
BUN: 42 mg/dL — AB (ref 6–23)
CHLORIDE: 99 meq/L (ref 96–112)
CO2: 25 mEq/L (ref 19–32)
CREATININE: 2.3 mg/dL — AB (ref 0.50–1.10)
Calcium: 10.1 mg/dL (ref 8.4–10.5)
GFR calc Af Amer: 24 mL/min — ABNORMAL LOW (ref >90)
GFR calc non Af Amer: 20 mL/min — ABNORMAL LOW (ref >90)
Glucose, Bld: 158 mg/dL — ABNORMAL HIGH (ref 70–99)
Potassium: 4.7 mEq/L (ref 3.7–5.3)
Sodium: 139 mEq/L (ref 137–147)
Total Protein: 7.3 g/dL (ref 6.0–8.3)

## 2014-03-04 LAB — CBC
HCT: 35.7 % — ABNORMAL LOW (ref 36.0–46.0)
Hemoglobin: 10.9 g/dL — ABNORMAL LOW (ref 12.0–15.0)
MCH: 29.1 pg (ref 26.0–34.0)
MCHC: 30.5 g/dL (ref 30.0–36.0)
MCV: 95.2 fL (ref 78.0–100.0)
Platelets: 129 10*3/uL — ABNORMAL LOW (ref 150–400)
RBC: 3.75 MIL/uL — ABNORMAL LOW (ref 3.87–5.11)
RDW: 14.9 % (ref 11.5–15.5)
WBC: 12.7 10*3/uL — AB (ref 4.0–10.5)

## 2014-03-04 LAB — URINE MICROSCOPIC-ADD ON

## 2014-03-04 LAB — URINALYSIS, ROUTINE W REFLEX MICROSCOPIC
BILIRUBIN URINE: NEGATIVE
GLUCOSE, UA: 100 mg/dL — AB
Hgb urine dipstick: NEGATIVE
KETONES UR: NEGATIVE mg/dL
Nitrite: NEGATIVE
PROTEIN: NEGATIVE mg/dL
Specific Gravity, Urine: 1.014 (ref 1.005–1.030)
UROBILINOGEN UA: 0.2 mg/dL (ref 0.0–1.0)
pH: 7.5 (ref 5.0–8.0)

## 2014-03-04 LAB — PROTIME-INR
INR: 1.71 — ABNORMAL HIGH (ref 0.00–1.49)
Prothrombin Time: 20.1 seconds — ABNORMAL HIGH (ref 11.6–15.2)

## 2014-03-04 MED ORDER — GI COCKTAIL ~~LOC~~
30.0000 mL | Freq: Once | ORAL | Status: DC
  Filled 2014-03-04: qty 30

## 2014-03-04 MED ORDER — ONDANSETRON HCL 4 MG/2ML IJ SOLN
4.0000 mg | Freq: Once | INTRAMUSCULAR | Status: AC
  Administered 2014-03-04: 4 mg via INTRAVENOUS
  Filled 2014-03-04: qty 2

## 2014-03-04 MED ORDER — FENTANYL CITRATE 0.05 MG/ML IJ SOLN
50.0000 ug | Freq: Once | INTRAMUSCULAR | Status: AC
  Administered 2014-03-04: 50 ug via INTRAVENOUS
  Filled 2014-03-04: qty 2

## 2014-03-04 MED ORDER — MORPHINE SULFATE 4 MG/ML IJ SOLN
4.0000 mg | Freq: Once | INTRAMUSCULAR | Status: AC
  Administered 2014-03-04: 4 mg via INTRAVENOUS
  Filled 2014-03-04: qty 1

## 2014-03-04 MED ORDER — MORPHINE SULFATE 4 MG/ML IJ SOLN
4.0000 mg | Freq: Once | INTRAMUSCULAR | Status: AC
Start: 2014-03-04 — End: 2014-03-04
  Administered 2014-03-04: 4 mg via INTRAVENOUS
  Filled 2014-03-04: qty 1

## 2014-03-04 NOTE — ED Notes (Addendum)
Abdominal crampy pain this afternoon. Vomiting afterward.

## 2014-03-04 NOTE — ED Notes (Signed)
C/o abd pain onset this pm  w v/v

## 2014-03-04 NOTE — ED Provider Notes (Signed)
CSN: [id number]     Arrival date & time 03/04/14  1914 History   First MD Initiated Contact with Patient 03/04/14 1948     Chief Complaint  Patient presents with  . Abdominal Pain     (Consider location/radiation/quality/duration/timing/severity/associated sxs/prior Treatment) HPI Comments: This is a 73 year old female with a past medical history of Crohn's disease, hypertension, atrial fibrillation, lower GI bleed, anemia, renal cell carcinoma and lupus who presents to the emergency department complaining of gradual onset midepigastric abdominal pain beginning this afternoon. Pain is been intermittent, coming and going at random, described as cramping and severe. No aggravating or alleviating factors. Admits to associated nausea with a few episodes of nonbloody, nonbilious emesis. She had a normal bowel movement today. Denies diarrhea. Denies fever, chills, chest pain, shortness of breath. History of colostomy and reversal in 1999, abdominal exploration surgery in 1995.  Patient is a 73 y.o. female presenting with abdominal pain. The history is provided by the patient.  Abdominal Pain Associated symptoms: nausea and vomiting     Past Medical History  Diagnosis Date  . Crohn's disease   . Hypertension   . Refusal of blood transfusion for reasons of conscience 12/20/2011    pt is Jehovah's Witness  . Atrial fibrillation   . DVT of leg (deep venous thrombosis) 2012-8./13/2013    "have had one in each leg now"  . Numbness and tingling of foot     bilaterally  . GERD (gastroesophageal reflux disease)   . Hypothyroidism   . Lower GI bleed 2012  . DVT of lower extremity, bilateral     "have had them in both legs; last one was on the RLE this year" (04/25/2012)  . OSA on CPAP   . Anemia     "when lupus flares up"  . SLE (systemic lupus erythematosus)   . Arthritis     "in my joints"  . Renal disorder     S/P nephrectomy; dialysis; "working fine now" (12/20/11)  . Renal cell carcinoma     Past Surgical History  Procedure Laterality Date  . Cesarean section  1984  . Nephrectomy transplanted organ  2006    bilaterally  . Colectomy  1998    "removal of abscess" (04/25/2012)  . Excisional hemorrhoidectomy    . Cataract extraction w/ intraocular lens  implant, bilateral  2005-2010  . Vena cava filter placement  2012  . Insertion of dialysis catheter  1988-2006    "peritoneal and hemodialysis; had multiple grafts and fistulas; last graft clotted off 2012"  . Cholecystectomy  1995  . Colostomy  1998  . Colostomy reversal  1999    "6 months after placed" (04/25/2012)  . Bone marrow biopsy  1993; 1995  . Abdominal exploration surgery  1995    "found sluggish bone marrow" (04/25/2012)  . Parathyroid implant removal Left     removed parathyroid from neck and implanted in arm  . Colonoscopy N/A 08/30/2013    Procedure: COLONOSCOPY;  Surgeon: Winfield Cunas., MD;  Location: [facility];  Service: Endoscopy;  Laterality: N/A;   Family History  Problem Relation Age of Onset  . Heart disease Mother   . Hypertension      runs in family  . Sleep apnea Sister    History  Substance Use Topics  . Smoking status: Never Smoker   . Smokeless tobacco: Never Used  . Alcohol Use: No   OB History   Grav Para Term Preterm Abortions TAB SAB Ect Mult  Living                 Review of Systems  Gastrointestinal: Positive for nausea, vomiting and abdominal pain.  All other systems reviewed and are negative.     Allergies  Amoxicillin; Ampicillin; Ciprofloxacin; Erythromycin; Penicillins; Sulfa antibiotics; Sulfamethoxazole-trimethoprim; Tetracycline; and Labetalol  Home Medications   Prior to Admission medications   Medication Sig Start Date End Date Taking? Authorizing Provider  amiodarone (PACERONE) 200 MG tablet Take 100 mg by mouth 2 (two) times daily.     Historical Provider, MD  calcitRIOL (ROCALTROL) 0.25 MCG capsule Take 0.25 mcg by mouth every other day.      Historical Provider, MD  epoetin alfa (EPOGEN,PROCRIT) 63016 UNIT/ML injection Inject 40,000 Units into the skin every 30 (thirty) days.    Historical Provider, MD  gabapentin (NEURONTIN) 100 MG capsule Take 200 mg by mouth at bedtime.     Historical Provider, MD  levothyroxine (SYNTHROID, LEVOTHROID) 100 MCG tablet Take 100 mcg by mouth daily.    Historical Provider, MD  metoprolol (LOPRESSOR) 100 MG tablet Take 100 mg by mouth 2 (two) times daily.    Historical Provider, MD  mycophenolate (MYFORTIC) 180 MG EC tablet Take 360 mg by mouth 2 (two) times daily.      Historical Provider, MD  omeprazole (PRILOSEC) 20 MG capsule Take 20 mg by mouth daily.     Historical Provider, MD  polyethylene glycol (MIRALAX / GLYCOLAX) packet Take 17 g by mouth 2 (two) times daily as needed. For constipation    Historical Provider, MD  polyethylene glycol-electrolytes (NULYTELY/GOLYTELY) 420 G solution  08/27/13   Historical Provider, MD  predniSONE (DELTASONE) 5 MG tablet Take 5 mg by mouth daily.     Historical Provider, MD  tacrolimus (PROGRAF) 1 MG capsule Take 1 mg by mouth 2 (two) times daily. Takes with 5 mg tablet    Historical Provider, MD  warfarin (COUMADIN) 5 MG tablet Take 2.5 mg by mouth daily. 2.5 mg every other day alternating with 1.5    Historical Provider, MD   BP 179/82  Pulse 73  Temp(Src) 98.2 F (36.8 C) (Oral)  Resp 20  Ht _0  (1.6 m)  Wt 133 lb (60.328 kg)  BMI 23.57 kg/m2  SpO2 100% Physical Exam  Nursing note and vitals reviewed. Constitutional: She is oriented to person, place, and time. She appears well-developed and well-nourished.  Uncomfortable, mildly distressed.  HENT:  Head: Normocephalic and atraumatic.  Mouth/Throat: Oropharynx is clear and moist.  Eyes: Conjunctivae are normal.  Neck: Normal range of motion. Neck supple.  Cardiovascular: Normal rate, regular rhythm and normal heart sounds.   Pulmonary/Chest: Effort normal and breath sounds normal.  Abdominal:  Soft. Normal appearance and bowel sounds are normal. She exhibits no distension. There is tenderness in the epigastric area. There is no rigidity, no rebound and no guarding.  No peritoneal signs.  Musculoskeletal: Normal range of motion. She exhibits no edema.  Neurological: She is alert and oriented to person, place, and time.  Skin: Skin is warm and dry. She is not diaphoretic.  Psychiatric: She has a normal mood and affect. Her behavior is normal.    ED Course  Procedures (including critical care time) Labs Review Labs Reviewed  URINALYSIS, ROUTINE W REFLEX MICROSCOPIC  CBC  COMPREHENSIVE METABOLIC PANEL  LIPASE, BLOOD    Imaging Review No results found.   EKG Interpretation None      MDM   Final diagnoses:  Abdominal pain  Pt nontoxic appearing, uncomfortable but in mild distress. AFVSS. Abdomen is tender in the midepigastric area without peritoneal signs. She is actively dry heaving during examination. Given history of multiple abdominal surgeries and significant pain, will obtain CT with contrast. Labs pending.  Labs showing mild leukocytosis, renal insufficiency. CT pending. Concern for possible bowel obstruction. If normal, plan to d/c home. Dispo pending CT.  Pt signed out to Dr. Aline Brochure at shift change.  Carman Ching, PA-C 03/05/14 1201

## 2014-03-05 DIAGNOSIS — I482 Chronic atrial fibrillation: Secondary | ICD-10-CM

## 2014-03-05 DIAGNOSIS — K5669 Other intestinal obstruction: Secondary | ICD-10-CM

## 2014-03-05 DIAGNOSIS — K50912 Crohn's disease, unspecified, with intestinal obstruction: Secondary | ICD-10-CM

## 2014-03-05 DIAGNOSIS — Z940 Kidney transplant status: Secondary | ICD-10-CM

## 2014-03-05 DIAGNOSIS — Z86718 Personal history of other venous thrombosis and embolism: Secondary | ICD-10-CM

## 2014-03-05 DIAGNOSIS — E039 Hypothyroidism, unspecified: Secondary | ICD-10-CM

## 2014-03-05 LAB — CBC
HEMATOCRIT: 30 % — AB (ref 36.0–46.0)
HEMOGLOBIN: 9.3 g/dL — AB (ref 12.0–15.0)
MCH: 28.4 pg (ref 26.0–34.0)
MCHC: 31 g/dL (ref 30.0–36.0)
MCV: 91.7 fL (ref 78.0–100.0)
Platelets: 114 10*3/uL — ABNORMAL LOW (ref 150–400)
RBC: 3.27 MIL/uL — ABNORMAL LOW (ref 3.87–5.11)
RDW: 14.8 % (ref 11.5–15.5)
WBC: 10.7 10*3/uL — AB (ref 4.0–10.5)

## 2014-03-05 LAB — BASIC METABOLIC PANEL
Anion gap: 11 (ref 5–15)
BUN: 32 mg/dL — ABNORMAL HIGH (ref 6–23)
CHLORIDE: 107 meq/L (ref 96–112)
CO2: 21 mEq/L (ref 19–32)
Calcium: 7.7 mg/dL — ABNORMAL LOW (ref 8.4–10.5)
Creatinine, Ser: 1.69 mg/dL — ABNORMAL HIGH (ref 0.50–1.10)
GFR calc Af Amer: 34 mL/min — ABNORMAL LOW (ref >90)
GFR calc non Af Amer: 30 mL/min — ABNORMAL LOW (ref >90)
GLUCOSE: 73 mg/dL (ref 70–99)
POTASSIUM: 4.2 meq/L (ref 3.7–5.3)
Sodium: 139 mEq/L (ref 137–147)

## 2014-03-05 LAB — HEPARIN LEVEL (UNFRACTIONATED): Heparin Unfractionated: 0.68 IU/mL (ref 0.30–0.70)

## 2014-03-05 LAB — PROTIME-INR
INR: 1.81 — ABNORMAL HIGH (ref 0.00–1.49)
Prothrombin Time: 21.1 seconds — ABNORMAL HIGH (ref 11.6–15.2)

## 2014-03-05 MED ORDER — PANTOPRAZOLE SODIUM 40 MG IV SOLR
40.0000 mg | INTRAVENOUS | Status: DC
  Administered 2014-03-05 – 2014-03-07 (×3): 40 mg via INTRAVENOUS
  Filled 2014-03-05 (×4): qty 40

## 2014-03-05 MED ORDER — LEVOTHYROXINE SODIUM 100 MCG IV SOLR
50.0000 ug | Freq: Every day | INTRAVENOUS | Status: DC
  Administered 2014-03-05 – 2014-03-07 (×3): 50 ug via INTRAVENOUS
  Filled 2014-03-05 (×4): qty 5

## 2014-03-05 MED ORDER — METOPROLOL TARTRATE 1 MG/ML IV SOLN
2.5000 mg | Freq: Four times a day (QID) | INTRAVENOUS | Status: DC
  Administered 2014-03-05 – 2014-03-07 (×9): 2.5 mg via INTRAVENOUS
  Filled 2014-03-05 (×12): qty 5

## 2014-03-05 MED ORDER — SODIUM CHLORIDE 0.9 % IV SOLN
INTRAVENOUS | Status: DC
  Administered 2014-03-05 (×2): via INTRAVENOUS
  Administered 2014-03-05: 100 mL/h via INTRAVENOUS
  Administered 2014-03-06 – 2014-03-07 (×3): via INTRAVENOUS

## 2014-03-05 MED ORDER — POLYVINYL ALCOHOL-POVIDONE 1.4-0.6 % OP SOLN: 1.0000 [drp] | Freq: Four times a day (QID) | OPHTHALMIC | Status: DC | PRN

## 2014-03-05 MED ORDER — POLYVINYL ALCOHOL 1.4 % OP SOLN
1.0000 [drp] | Freq: Four times a day (QID) | OPHTHALMIC | Status: DC | PRN
  Filled 2014-03-05: qty 15

## 2014-03-05 MED ORDER — SODIUM CHLORIDE 0.9 % IV SOLN: INTRAVENOUS | Status: AC

## 2014-03-05 MED ORDER — WARFARIN SODIUM 2.5 MG PO TABS
2.5000 mg | ORAL_TABLET | Freq: Once | ORAL | Status: AC
  Administered 2014-03-05: 2.5 mg via ORAL
  Filled 2014-03-05: qty 1

## 2014-03-05 MED ORDER — CALCITRIOL 0.25 MCG PO CAPS
0.2500 ug | ORAL_CAPSULE | ORAL | Status: DC
  Filled 2014-03-05: qty 1

## 2014-03-05 MED ORDER — GABAPENTIN 100 MG PO CAPS
200.0000 mg | ORAL_CAPSULE | Freq: Every day | ORAL | Status: DC
  Administered 2014-03-05: 200 mg via ORAL
  Filled 2014-03-05 (×2): qty 2

## 2014-03-05 MED ORDER — PREDNISONE 5 MG PO TABS
5.0000 mg | ORAL_TABLET | Freq: Every day | ORAL | Status: DC
  Filled 2014-03-05: qty 1

## 2014-03-05 MED ORDER — HEPARIN SODIUM (PORCINE) 5000 UNIT/ML IJ SOLN
5000.0000 [IU] | Freq: Three times a day (TID) | INTRAMUSCULAR | Status: DC
  Administered 2014-03-05: 5000 [IU] via SUBCUTANEOUS
  Filled 2014-03-05 (×4): qty 1

## 2014-03-05 MED ORDER — LEVOTHYROXINE SODIUM 100 MCG PO TABS
100.0000 ug | ORAL_TABLET | Freq: Every day | ORAL | Status: DC
  Filled 2014-03-05 (×2): qty 1

## 2014-03-05 MED ORDER — TACROLIMUS 1 MG PO CAPS
2.0000 mg | ORAL_CAPSULE | Freq: Every day | ORAL | Status: DC
  Administered 2014-03-05 – 2014-03-06 (×2): 2 mg via SUBLINGUAL
  Filled 2014-03-05 (×3): qty 2

## 2014-03-05 MED ORDER — MORPHINE SULFATE 2 MG/ML IJ SOLN
2.0000 mg | INTRAMUSCULAR | Status: DC | PRN
  Administered 2014-03-05: 2 mg via INTRAVENOUS
  Filled 2014-03-05: qty 1

## 2014-03-05 MED ORDER — HEPARIN (PORCINE) IN NACL 100-0.45 UNIT/ML-% IJ SOLN
850.0000 [IU]/h | INTRAMUSCULAR | Status: DC
  Administered 2014-03-05: 950 [IU]/h via INTRAVENOUS
  Administered 2014-03-06: 850 [IU]/h via INTRAVENOUS
  Filled 2014-03-05 (×2): qty 250

## 2014-03-05 MED ORDER — METHYLPREDNISOLONE SODIUM SUCC 40 MG IJ SOLR
15.0000 mg | Freq: Every day | INTRAMUSCULAR | Status: DC
  Administered 2014-03-05 – 2014-03-07 (×3): 15.2 mg via INTRAVENOUS
  Filled 2014-03-05 (×4): qty 0.38

## 2014-03-05 MED ORDER — AMIODARONE HCL 100 MG PO TABS
100.0000 mg | ORAL_TABLET | Freq: Two times a day (BID) | ORAL | Status: DC
  Administered 2014-03-05: 100 mg via ORAL
  Filled 2014-03-05 (×3): qty 1

## 2014-03-05 MED ORDER — WARFARIN - PHARMACIST DOSING INPATIENT
Freq: Every day | Status: DC
Start: 2014-03-05 — End: 2014-03-05

## 2014-03-05 MED ORDER — ONDANSETRON HCL 4 MG/2ML IJ SOLN
4.0000 mg | Freq: Four times a day (QID) | INTRAMUSCULAR | Status: DC | PRN
  Administered 2014-03-05 (×3): 4 mg via INTRAVENOUS
  Filled 2014-03-05 (×3): qty 2

## 2014-03-05 MED ORDER — MYCOPHENOLATE SODIUM 180 MG PO TBEC
360.0000 mg | DELAYED_RELEASE_TABLET | Freq: Two times a day (BID) | ORAL | Status: DC
  Administered 2014-03-05: 360 mg via ORAL
  Filled 2014-03-05 (×3): qty 2

## 2014-03-05 MED ORDER — TACROLIMUS 0.5 MG PO CAPS
2.5000 mg | ORAL_CAPSULE | Freq: Every morning | ORAL | Status: DC
  Administered 2014-03-06 – 2014-03-07 (×2): 2.5 mg via SUBLINGUAL
  Filled 2014-03-05 (×4): qty 1

## 2014-03-05 MED ORDER — TACROLIMUS 1 MG PO CAPS
6.0000 mg | ORAL_CAPSULE | Freq: Two times a day (BID) | ORAL | Status: DC
  Administered 2014-03-05 (×2): 6 mg via ORAL
  Filled 2014-03-05 (×3): qty 6

## 2014-03-05 MED ORDER — PANTOPRAZOLE SODIUM 40 MG PO TBEC: 40.0000 mg | DELAYED_RELEASE_TABLET | Freq: Every day | ORAL | Status: DC

## 2014-03-05 MED ORDER — MYCOPHENOLATE MOFETIL HCL 500 MG IV SOLR
500.0000 mg | Freq: Two times a day (BID) | INTRAVENOUS | Status: DC
  Administered 2014-03-05 – 2014-03-06 (×3): 500 mg via INTRAVENOUS
  Filled 2014-03-05 (×6): qty 15

## 2014-03-05 MED ORDER — METOPROLOL TARTRATE 100 MG PO TABS
100.0000 mg | ORAL_TABLET | Freq: Two times a day (BID) | ORAL | Status: DC
  Administered 2014-03-05: 100 mg via ORAL
  Filled 2014-03-05 (×3): qty 1

## 2014-03-05 NOTE — Progress Notes (Addendum)
ANTICOAGULATION CONSULT NOTE - Initial Consult  Pharmacy Consult for heparin Indication: atrial fibrillation  Allergies  Allergen Reactions  . Amoxicillin Anaphylaxis  . Ampicillin Anaphylaxis  . Ciprofloxacin Swelling    "of my throat"  . Erythromycin Anaphylaxis  . Penicillins Anaphylaxis  . Sulfa Antibiotics Itching  . Sulfamethoxazole-Trimethoprim Itching  . Tetracycline Anaphylaxis  . Labetalol Nausea And Vomiting    Patient Measurements: Height: 5' 3"  (160 cm) Weight: 156 lb 1.4 oz (70.8 kg) IBW/kg (Calculated) : 52.4 Heparin Dosing Weight: 67 kg  Vital Signs: Temp: 98.3 F (36.8 C) (10/28 0539) Temp Source: Oral (10/28 0539) BP: 155/70 mmHg (10/28 0539) Pulse Rate: 65 (10/28 0539)  Labs:  Recent Labs  03/04/14 2016 03/04/14 2259 03/05/14 0650  HGB 10.9*  --  9.3*  HCT 35.7*  --  30.0*  PLT 129*  --  114*  LABPROT  --  20.1* 21.1*  INR  --  1.71* 1.81*  CREATININE 2.30*  --  1.69*    Estimated Creatinine Clearance: 29.2 ml/min (by C-G formula based on Cr of 1.69).   Medical History: Past Medical History  Diagnosis Date  . Crohn's disease   . Hypertension   . Refusal of blood transfusion for reasons of conscience 12/20/2011    pt is Jehovah's Witness  . Atrial fibrillation   . DVT of leg (deep venous thrombosis) 2012-8./13/2013    "have had one in each leg now"  . Numbness and tingling of foot     bilaterally  . GERD (gastroesophageal reflux disease)   . Hypothyroidism   . Lower GI bleed 2012  . DVT of lower extremity, bilateral     "have had them in both legs; last one was on the RLE this year" (04/25/2012)  . OSA on CPAP   . Anemia     "when lupus flares up"  . SLE (systemic lupus erythematosus)   . Arthritis     "in my joints"  . Renal disorder     S/P nephrectomy; dialysis; "working fine now" (12/20/11)  . Renal cell carcinoma     Medications:  Prescriptions prior to admission  Medication Sig Dispense Refill  . amiodarone  (PACERONE) 200 MG tablet Take 100 mg by mouth 2 (two) times daily.       . calcitRIOL (ROCALTROL) 0.25 MCG capsule Take 0.25 mcg by mouth every other day.       Marland Kitchen epoetin alfa (EPOGEN,PROCRIT) 41740 UNIT/ML injection Inject 40,000 Units into the skin every 21 ( twenty-one) days. Due Thursday 03/06/14      . gabapentin (NEURONTIN) 100 MG capsule Take 200 mg by mouth at bedtime.       Marland Kitchen levothyroxine (SYNTHROID, LEVOTHROID) 100 MCG tablet Take 100 mcg by mouth daily.      . metoprolol (LOPRESSOR) 100 MG tablet Take 100 mg by mouth 2 (two) times daily.      . mycophenolate (MYFORTIC) 180 MG EC tablet Take 360 mg by mouth 2 (two) times daily.        Marland Kitchen omeprazole (PRILOSEC) 20 MG capsule Take 20 mg by mouth daily.       . polyethylene glycol (MIRALAX / GLYCOLAX) packet Take 17 g by mouth 2 (two) times daily as needed. For constipation      . polyvinyl alcohol-povidone (HYPOTEARS) 1.4-0.6 % ophthalmic solution Place 1-2 drops into both eyes 4 (four) times daily as needed (dry eyes).      . predniSONE (DELTASONE) 5 MG tablet Take 5 mg by mouth  daily.       . tacrolimus (PROGRAF) 1 MG capsule Take 4 mg by mouth every evening.       . tacrolimus (PROGRAF) 5 MG capsule Take 5 mg by mouth every morning.      . warfarin (COUMADIN) 2.5 MG tablet Take 1.25 mg by mouth every other day. Patient takes half of a 22m tablet every other day, alternating with half of a 2.5 mg tablet      . warfarin (COUMADIN) 5 MG tablet Take 2.5 mg by mouth every other day. Patient takes half of a 541mtablet every other day, alternating with half of a 2.5 mg tablet        Assessment: [age]/o female with Crohn's disease, renal transplant, and hx of multiple admissions for SBO who presented to the ED today with abdominal pain. CT abd/pelvis reveals SBO.  Pharmacy consulted to begin IV heparin for Afib while warfarin is on hold. INR is subtherapeutic at 1.81. No bleeding noted, Hb is low at 9.3, platelets are also low at 114K. She was  given warfarin 2.5 mg early this morning.  Also requested to help change tacrolimus, mycophenolate, and amiodarone to IV form while patient is NPO. See recommendations below.  Goal of Therapy:  Heparin level 0.3-0.7 units/ml Monitor platelets by anticoagulation protocol: Yes   Plan:  - Begin heparin drip at 950 units/hr with no bolus - 8 hr heparin level - Daily heparin level and CBC - Discontinue SQ heparin - Discontinue warfarin consult - please let pharmacy know when able to take PO  - Recommend holding amiodarone short term and not starting IV drip since half life is very long. - Mycophenolate 500 mg IV bid - should not exceed more than 14 days and change to PO when able (equal to 720 mg mycophenolate EC daily) - IV tacrolimus should only be used as a last resort. Recommend to give SL tacrolimus. Dosing would be 2.5 mg SL qam and 2 mg SL qpm.  JeRenold GentaPharm.D., BCPS Clinical Pharmacist Pager: [phone]/28/2015 11:18 AM

## 2014-03-05 NOTE — Progress Notes (Signed)
PROGRESS NOTE  [name] EZM:[id number] DOB: 1940-12-30 DOA: 03/04/2014 PCP: Placido Sou, MD  HPI/Recap of past 29 hours: 73 year old female with past medical history of renal transplant plus bowel resection from Crohn's disease whose had a number of admissions in the past for partial small bowel obstruction that have not once required surgical intervention presented to the emergency room on the evening of 10/27 with complaints of abdominal cramping and pain with some associated nausea and vomiting. CT scan done noted partial small bowel obstruction and patient was admitted to the hospitalist service.  Patient seen after arrival to floor. NG tube in place. No complaints other than mild discomfort from tube.  Assessment/Plan: Principal Problem:   SBO (small bowel obstruction): Have changed medications over to IV. Repeat x-rays in the morning. Advance diet once improved and tube removed Active Problems:   Atrial fibrillation: Anticoagulation chronic. Changed to heparin. On IV Lopressor. Amiodarone held as this has a long half-life   History of renal transplant: Appreciate pharmacy assistance in converting medications. Prograf changed to subcutaneous. Mycophenolate changed IV.    HTN (hypertension): Lopressor changed IV    Crohn's disease: Stable. On low-dose  prednisone, converted to IV Solu-Medrol and dose increased for stress dose  Hypothyroidism: Change Synthroid to by mouth  History of DVT: On chronic Coumadin. Coumadin held and heparin per pharmacy  Code Statusfull code Family Communication:  left message with family   Disposition Plan: here for several more days about obstruction improves  Consultants: None.  Procedures: None  Antibiotics: None  Objective: BP 142/72  Pulse 65  Temp(Src) 98.8 F (37.1 C) (Oral)  Resp 18  Ht 5' 3"  (1.6 m)  Wt 70.8 kg (156 lb 1.4 oz)  BMI 27.66 kg/m2  SpO2 96%  LMP 03/05/2014  Intake/Output Summary (Last 24 hours) at  03/05/14 1633 Last data filed at 03/05/14 0924  Gross per 24 hour  Intake    400 ml  Output    100 ml  Net    300 ml   Filed Weights   03/04/14 1938 03/05/14 0045  Weight: 60.328 kg (133 lb) 70.8 kg (156 lb 1.4 oz)    Exam:   GeneraAlert and oriented 3, no acute distress  CardioRegular rate and rhythm, S1-S2  RespirClear to auscultation bilaterally  AbdomeSoft, nontender, absent bowel sounds,  Musculoskeletal: No clubbing or cyanosis or edema  Data Reviewed: Basic Metabolic Panel:  Recent Labs Lab 03/04/14 2016 03/05/14 0650  NA 139 139  K 4.7 4.2  CL 99 107  CO2 25 21  GLUCOSE 158* 73  BUN 42* 32*  CREATININE 2.30* 1.69*  CALCIUM 10.1 7.7*   Liver Function Tests:  Recent Labs Lab 03/04/14 2016  AST 19  ALT 11  ALKPHOS 127*  BILITOT 0.3  PROT 7.3  ALBUMIN 4.0    Recent Labs Lab 03/04/14 2016  LIPASE 33   No results found for this basename: AMMONIA,  in the last 168 hours CBC:  Recent Labs Lab 03/04/14 2016 03/05/14 0650  WBC 12.7* 10.7*  HGB 10.9* 9.3*  HCT 35.7* 30.0*  MCV 95.2 91.7  PLT 129* 114*   Cardiac Enzymes:   No results found for this basename: CKTOTAL, CKMB, CKMBINDEX, TROPONINI,  in the last 168 hours BNP (last 3 results) No results found for this basename: PROBNP,  in the last 8760 hours CBG: No results found for this basename: GLUCAP,  in the last 168 hours  No results found for this or any previous  visit (from the past 240 hour(s)).   Studies: Ct Abdomen Pelvis Wo Contrast  03/04/2014   CLINICAL DATA:  Abdominal pain, nausea and vomiting  EXAM: CT ABDOMEN AND PELVIS WITHOUT CONTRAST  TECHNIQUE: Multidetector CT imaging of the abdomen and pelvis was performed following the standard protocol without IV contrast.  COMPARISON:  04/23/2012  FINDINGS: Lower chest: Calcified plaque identified within the right lung base. Scarring is noted in both lower lobes. No pleural effusion. There is calcified atherosclerotic disease  involving the RCA and left circumflex coronary arteries.  Hepatobiliary: Similar appearance of right hepatic lobe cyst measuring 5 mm, image 12/series 2. Previous cholecystectomy. Increase caliber of the common bile duct noted which measures up to 1 cm. No stone or mass identified.  Pancreas:  Unremarkable appearance of the pancreas.  Spleen: The spleen is normal.  Adrenals/Urinary Tract: The adrenal glands are unremarkable. The patient is status post bilateral nephrectomy. A renal graft is identified within the right iliac fossa. The urinary bladder appears normal.  Stomach/Bowel: The stomach appears normal. The small bowel loops appear increased in caliber 4.7 cm transition to decreased caliber distal small bowel loops identified within the right lower quadrant, image 44/series 5. Multiple colonic diverticula are identified. No evidence for acute diverticulitis.  Vascular/Lymphatic: Calcified atherosclerotic disease involves the abdominal aorta. No aneurysm. There is a IVC filter in place. No retroperitoneal adenopathy. No small bowel mesenteric adenopathy. No enlarged pelvic or inguinal lymph nodes.  Reproductive: The uterus and the adnexal structures are unremarkable for patient's age.  Other: No free fluid or fluid collections within the abdomen or pelvis. There is a small ventral abdominal wall hernia which contains fat only, image number 24/series 2.  Musculoskeletal: Review of the visualized bony structures is on unremarkable. No aggressive lytic or sclerotic bone lesions identified.  IMPRESSION: 1. Abnormal small bowel dilatation concerning for high-grade partial small bowel obstruction. The transition point is in the right lower quadrant of the abdomen proximal to the terminal ileum. 2. No evidence for perforation. 3. Status post bilateral nephrectomy with right lower quadrant renal graft. 4. Previous cholecystectomy. There is abnormal increased caliber of the common bile duct. No stone or mass noted. 5.  Atherosclerotic disease. 6. Calcified pleural plaque in the right base may be due to previous asbestos exposure.   Electronically Signed   By: Kerby Moors M.D.   On: 03/04/2014 22:46    Scheduled Meds: . levothyroxine  50 mcg Intravenous QAC breakfast  . methylPREDNISolone (SOLU-MEDROL) injection  15.2 mg Intravenous Daily  . metoprolol  2.5 mg Intravenous 4 times per day  . mycophenolate (CELLCEPT) IV  500 mg Intravenous Q12H  . pantoprazole (PROTONIX) IV  40 mg Intravenous Q24H  . tacrolimus  2 mg Sublingual QHS  . tacrolimus  2.5 mg Sublingual q morning - 10a    Continuous Infusions: . sodium chloride 100 mL/hr (03/05/14 1211)  . heparin 950 Units/hr (03/05/14 1211)     Time spent:25 minutes  Annita Brod  [facility] Pager [phone]  If 7PM-7AM, please contact night-coverage at [email or web address], password [facility] 03/05/2014, 4:33 PM  LOS: 1 day

## 2014-03-05 NOTE — Progress Notes (Signed)
INITIAL NUTRITION ASSESSMENT  DOCUMENTATION CODES Per approved criteria  -Not Applicable   INTERVENTION: Diet advancement per MD Add Resource Breeze TID if diet advances to clear liquids Add Ensure Complete BID if diet advances to regular diet  NUTRITION DIAGNOSIS: Inadequate oral intake related to SBO as evidenced by NPO status.   Goal: Pt to meet >/= 90% of their estimated nutrition needs   Monitor:  Diet advancement, PO intake, weight trend, labs  Reason for Assessment: Malnutrition Screening Tool (score of2  73 y.o. female  Admitting Dx: SBO (small bowel obstruction)  ASSESSMENT: 73 y.o. female with PMH of Crohn's disease, Renal transplant, colostomy and reversal after a bowel resection in 1999, multiple admissions for PSBO in the past. Patient presents to the ED at [facility] with c/o gradual onset midepigastric abdominal pain.   Pt remains NPO today with NGT to Lnare to LWIS. Pt reports she has a good appetite and was eating well up until 1 day PTA at which time she became nauseous and vomited a few times. She reports that she usually weighs 135 lbs but, when she was weighed upon admission her weight was 118 lbs. Pt reports that in the past after SBO resolves, it usually takes a while for her appetite to return to normal and she usually drinks Boost/Ensure supplements until she is able to eat normally again.   Nutrition Focused Physical Exam:  Subcutaneous Fat:  Orbital Region: mild wasting Upper Arm Region: mild wasting Thoracic and Lumbar Region: wnl  Muscle:  Temple Region: wnl Clavicle Bone Region: wnl Clavicle and Acromion Bone Region: wnl Scapular Bone Region: NA Dorsal Hand: wnl Patellar Region: wnl Anterior Thigh Region: wnl Posterior Calf Region: mild wasting  Edema: none  Height: Ht Readings from Last 1 Encounters:  03/05/14 _0  (1.6 m)    Weight: Wt Readings from Last 1 Encounters:  03/05/14 156 lb 1.4 oz (70.8 kg)    Ideal Body Weight: 115  lbs  % Ideal Body Weight: 135%  Wt Readings from Last 10 Encounters:  03/05/14 156 lb 1.4 oz (70.8 kg)  10/15/13 130 lb (58.968 kg)  09/04/13 130 lb (58.968 kg)  08/30/13 131 lb (59.421 kg)  08/30/13 131 lb (59.421 kg)  08/12/13 131 lb 3.2 oz (59.512 kg)  07/23/13 137 lb (62.143 kg)  02/15/13 137 lb (62.143 kg)  04/23/12 138 lb (62.596 kg)  12/20/11 133 lb 3.2 oz (60.419 kg)    Usual Body Weight: 135 lbs  % Usual Body Weight: 115%  BMI:  Body mass index is 27.66 kg/(m^2).  Estimated Nutritional Needs: Kcal: 1450-1650 Protein: 70-80 grams Fluid: 1.2-1.6 L/day  Skin: intact  Diet Order: NPO  EDUCATION NEEDS: -No education needs identified at this time   Intake/Output Summary (Last 24 hours) at 03/05/14 1318 Last data filed at 03/05/14 0924  Gross per 24 hour  Intake    400 ml  Output    100 ml  Net    300 ml    Last BM: 10/27  Labs:   Recent Labs Lab 03/04/14 2016 03/05/14 0650  NA 139 139  K 4.7 4.2  CL 99 107  CO2 25 21  BUN 42* 32*  CREATININE 2.30* 1.69*  CALCIUM 10.1 7.7*  GLUCOSE 158* 73    CBG (last 3)  No results found for this basename: GLUCAP,  in the last 72 hours  Scheduled Meds: . sodium chloride   Intravenous STAT  . levothyroxine  50 mcg Intravenous QAC breakfast  . methylPREDNISolone (  SOLU-MEDROL) injection  15.2 mg Intravenous Daily  . metoprolol  2.5 mg Intravenous 4 times per day  . mycophenolate (CELLCEPT) IV  500 mg Intravenous Q12H  . pantoprazole (PROTONIX) IV  40 mg Intravenous Q24H  . tacrolimus  2 mg Sublingual QHS  . tacrolimus  2.5 mg Sublingual q morning - 10a    Continuous Infusions: . sodium chloride 100 mL/hr (03/05/14 1211)  . heparin 950 Units/hr (03/05/14 1211)    Past Medical History  Diagnosis Date  . Crohn's disease   . Hypertension   . Refusal of blood transfusion for reasons of conscience 12/20/2011    pt is Jehovah's Witness  . Atrial fibrillation   . DVT of leg (deep venous thrombosis)  2012-8./13/2013    "have had one in each leg now"  . Numbness and tingling of foot     bilaterally  . GERD (gastroesophageal reflux disease)   . Hypothyroidism   . Lower GI bleed 2012  . DVT of lower extremity, bilateral     "have had them in both legs; last one was on the RLE this year" (04/25/2012)  . OSA on CPAP   . Anemia     "when lupus flares up"  . SLE (systemic lupus erythematosus)   . Arthritis     "in my joints"  . Renal disorder     S/P nephrectomy; dialysis; "working fine now" (12/20/11)  . Renal cell carcinoma     Past Surgical History  Procedure Laterality Date  . Cesarean section  1984  . Nephrectomy transplanted organ  2006    bilaterally  . Colectomy  1998    "removal of abscess" (04/25/2012)  . Excisional hemorrhoidectomy    . Cataract extraction w/ intraocular lens  implant, bilateral  2005-2010  . Vena cava filter placement  2012  . Insertion of dialysis catheter  1988-2006    "peritoneal and hemodialysis; had multiple grafts and fistulas; last graft clotted off 2012"  . Cholecystectomy  1995  . Colostomy  1998  . Colostomy reversal  1999    "6 months after placed" (04/25/2012)  . Bone marrow biopsy  1993; 1995  . Abdominal exploration surgery  1995    "found sluggish bone marrow" (04/25/2012)  . Parathyroid implant removal Left     removed parathyroid from neck and implanted in arm  . Colonoscopy N/A 08/30/2013    Procedure: COLONOSCOPY;  Surgeon: Winfield Cunas., MD;  Location: [facility];  Service: Endoscopy;  Laterality: N/A;    Pryor Ochoa RD, LDN Inpatient Clinical Dietitian Pager: [phone] After Hours Pager: [phone]

## 2014-03-05 NOTE — Progress Notes (Signed)
ANTICOAGULATION CONSULT NOTE - [place] for heparin Indication: atrial fibrillation  Allergies  Allergen Reactions  . Amoxicillin Anaphylaxis  . Ampicillin Anaphylaxis  . Ciprofloxacin Swelling    "of my throat"  . Erythromycin Anaphylaxis  . Penicillins Anaphylaxis  . Sulfa Antibiotics Itching  . Sulfamethoxazole-Trimethoprim Itching  . Tetracycline Anaphylaxis  . Labetalol Nausea And Vomiting    Patient Measurements: Height: 5' 3"  (160 cm) Weight: 156 lb 1.4 oz (70.8 kg) IBW/kg (Calculated) : 52.4 Heparin Dosing Weight: 67 kg  Vital Signs: Temp: 98.2 F (36.8 C) (10/28 2120) Temp Source: Oral (10/28 2120) BP: 128/63 mmHg (10/28 2120) Pulse Rate: 66 (10/28 2120)  Labs:  Recent Labs  03/04/14 2016 03/04/14 2259 03/05/14 0650 03/05/14 2003  HGB 10.9*  --  9.3*  --   HCT 35.7*  --  30.0*  --   PLT 129*  --  114*  --   LABPROT  --  20.1* 21.1*  --   INR  --  1.71* 1.81*  --   HEPARINUNFRC  --   --   --  0.68  CREATININE 2.30*  --  1.69*  --     Estimated Creatinine Clearance: 29.2 ml/min (by C-G formula based on Cr of 1.69).   Medical History: Past Medical History  Diagnosis Date  . Crohn's disease   . Hypertension   . Refusal of blood transfusion for reasons of conscience 12/20/2011    pt is Jehovah's Witness  . Atrial fibrillation   . DVT of leg (deep venous thrombosis) 2012-8./13/2013    "have had one in each leg now"  . Numbness and tingling of foot     bilaterally  . GERD (gastroesophageal reflux disease)   . Hypothyroidism   . Lower GI bleed 2012  . DVT of lower extremity, bilateral     "have had them in both legs; last one was on the RLE this year" (04/25/2012)  . OSA on CPAP   . Anemia     "when lupus flares up"  . SLE (systemic lupus erythematosus)   . Arthritis     "in my joints"  . Renal disorder     S/P nephrectomy; dialysis; "working fine now" (12/20/11)  . Renal cell carcinoma     Medications:   Prescriptions prior to admission  Medication Sig Dispense Refill  . amiodarone (PACERONE) 200 MG tablet Take 100 mg by mouth 2 (two) times daily.       . calcitRIOL (ROCALTROL) 0.25 MCG capsule Take 0.25 mcg by mouth every other day.       Marland Kitchen epoetin alfa (EPOGEN,PROCRIT) 60737 UNIT/ML injection Inject 40,000 Units into the skin every 21 ( twenty-one) days. Due Thursday 03/06/14      . gabapentin (NEURONTIN) 100 MG capsule Take 200 mg by mouth at bedtime.       Marland Kitchen levothyroxine (SYNTHROID, LEVOTHROID) 100 MCG tablet Take 100 mcg by mouth daily.      . metoprolol (LOPRESSOR) 100 MG tablet Take 100 mg by mouth 2 (two) times daily.      . mycophenolate (MYFORTIC) 180 MG EC tablet Take 360 mg by mouth 2 (two) times daily.        Marland Kitchen omeprazole (PRILOSEC) 20 MG capsule Take 20 mg by mouth daily.       . polyethylene glycol (MIRALAX / GLYCOLAX) packet Take 17 g by mouth 2 (two) times daily as needed. For constipation      . polyvinyl alcohol-povidone (HYPOTEARS) 1.4-0.6 %  ophthalmic solution Place 1-2 drops into both eyes 4 (four) times daily as needed (dry eyes).      . predniSONE (DELTASONE) 5 MG tablet Take 5 mg by mouth daily.       . tacrolimus (PROGRAF) 1 MG capsule Take 4 mg by mouth every evening.       . tacrolimus (PROGRAF) 5 MG capsule Take 5 mg by mouth every morning.      . warfarin (COUMADIN) 2.5 MG tablet Take 1.25 mg by mouth every other day. Patient takes half of a 77m tablet every other day, alternating with half of a 2.5 mg tablet      . warfarin (COUMADIN) 5 MG tablet Take 2.5 mg by mouth every other day. Patient takes half of a 531mtablet every other day, alternating with half of a 2.5 mg tablet        Assessment: 89/o female with Crohn's disease, renal transplant, and hx of multiple admissions for SBO who presented to the ED today with abdominal pain. CT abd/pelvis reveals SBO.  Pharmacy consulted to begin IV heparin for Afib while warfarin is on hold. INR is subtherapeutic at  1.81. No bleeding noted, Hb is low at 9.3, platelets are also low at 114K. She was given warfarin 2.5 mg early this morning.  Initial heparin level was therapeutic at 0.68 on heparin 950 units/hr without bolus. With large jump in heparin level, plan to decrease dose to maintain therapeutic level.  Goal of Therapy:  Heparin level 0.3-0.7 units/ml Monitor platelets by anticoagulation protocol: Yes   Plan:  - Reduce heparin drip to 850 units/hr - Daily heparin level and CBC  CoAndrey CotaBaDiona FoleyPharmD Clinical Pharmacist Pager [phone]/28/2015 9:45 PM

## 2014-03-05 NOTE — ED Provider Notes (Signed)
Medical screening examination/treatment/procedure(s) were conducted as a shared visit with non-physician practitioner(s) and myself.  I personally evaluated the patient during the encounter.   EKG Interpretation   Date/Time:  Tuesday March 04 2014 19:50:17 EDT Ventricular Rate:  62 PR Interval:  218 QRS Duration: 126 QT Interval:  442 QTC Calculation: 448 R Axis:   81 Text Interpretation:  Sinus rhythm with 1st degree A-V block Right bundle  branch block t waves in V2-V3 no longer inverted, also III no longer  inverted otherwise no significant change Confirmed by [place] Bowlds  MD,  Danella Philson ([id number]) on 03/04/2014 8:01:11 PM      I interviewed and examined the patient. Lungs are CTAB. Cardiac exam wnl. Abdomen soft w/ mild ttp of epig area. I assumed care of this patient at 10:20 PM while awaiting CT abd.   CT c/w high grade bowel obst. The pt's abd remains soft, she is in no acute distress. Will order NG tube and admit to hospitalist at [facility].   Pamella Pert, MD 03/05/14 1434

## 2014-03-05 NOTE — Progress Notes (Signed)
Utilization review completed.  

## 2014-03-05 NOTE — Progress Notes (Signed)
ANTICOAGULATION CONSULT NOTE - Initial Consult  Pharmacy Consult for Warfarin  Indication: atrial fibrillation and hx DVT  Allergies  Allergen Reactions  . Amoxicillin Anaphylaxis  . Ampicillin Anaphylaxis  . Ciprofloxacin Swelling    "of my throat"  . Erythromycin Anaphylaxis  . Penicillins Anaphylaxis  . Sulfa Antibiotics Itching  . Sulfamethoxazole-Trimethoprim Itching  . Tetracycline Anaphylaxis  . Labetalol Nausea And Vomiting    Patient Measurements: Height: 5' 3"  (160 cm) Weight: 133 lb (60.328 kg) IBW/kg (Calculated) : 52.4  Vital Signs: Temp: 98.6 F (37 C) (10/27 2303) Temp Source: Oral (10/27 2303) BP: 187/77 mmHg (10/27 2303) Pulse Rate: 67 (10/27 2303)  Labs:  Recent Labs  03/04/14 2016 03/04/14 2259  HGB 10.9*  --   HCT 35.7*  --   PLT 129*  --   LABPROT  --  20.1*  INR  --  1.71*  CREATININE 2.30*  --     Estimated Creatinine Clearance: 18.8 ml/min (by C-G formula based on Cr of 2.3).   Medical History: Past Medical History  Diagnosis Date  . Crohn's disease   . Hypertension   . Refusal of blood transfusion for reasons of conscience 12/20/2011    pt is Jehovah's Witness  . Atrial fibrillation   . DVT of leg (deep venous thrombosis) 2012-8./13/2013    "have had one in each leg now"  . Numbness and tingling of foot     bilaterally  . GERD (gastroesophageal reflux disease)   . Hypothyroidism   . Lower GI bleed 2012  . DVT of lower extremity, bilateral     "have had them in both legs; last one was on the RLE this year" (04/25/2012)  . OSA on CPAP   . Anemia     "when lupus flares up"  . SLE (systemic lupus erythematosus)   . Arthritis     "in my joints"  . Renal disorder     S/P nephrectomy; dialysis; "working fine now" (12/20/11)  . Renal cell carcinoma     Assessment: Warfarin PTA for afib/hx DVT. INR 1.71 on admit. Hgb 10.9. Other labs as above.   Goal of Therapy:  INR 2-3 Monitor platelets by anticoagulation protocol: Yes   Plan:  -Warfarin 2.5 mg PO X 1 now -Daily PT/INR, adjust dose as needed -Monitor for bleeding  Narda Bonds 03/05/2014,1:23 AM

## 2014-03-05 NOTE — H&P (Addendum)
[facility] History and Physical  [name] DOB: Aug 18, 1940 DOA: 03/04/2014  Referring physician: EDP PCP: Placido Sou, MD   Chief Complaint: Abd pain   HPI: [name] is a 73 y.o. female with PMH of Crohn's disease, Renal transplant, colostomy and reversal after a bowel resection in 1999, multiple admissions for PSBO in the past.  Patient presents to the ED at  [facility] with c/o gradual onset midepigastric abdominal pain.  Symptoms onset earlier this afternoon.  Pain is intermittent and crampy.  Few episodes of NBNB emesis.  Did have normal BM earlier today.  No diarrhea, no fever, no chills, no chest pain.  Work up in ED demonstrates SBO on CT abd/pelvis.  Review of Systems: Systems reviewed.  As above, otherwise negative  Past Medical History  Diagnosis Date  . Crohn's disease   . Hypertension   . Refusal of blood transfusion for reasons of conscience 12/20/2011    pt is Jehovah's Witness  . Atrial fibrillation   . DVT of leg (deep venous thrombosis) 2012-8./13/2013    "have had one in each leg now"  . Numbness and tingling of foot     bilaterally  . GERD (gastroesophageal reflux disease)   . Hypothyroidism   . Lower GI bleed 2012  . DVT of lower extremity, bilateral     "have had them in both legs; last one was on the RLE this year" (04/25/2012)  . OSA on CPAP   . Anemia     "when lupus flares up"  . SLE (systemic lupus erythematosus)   . Arthritis     "in my joints"  . Renal disorder     S/P nephrectomy; dialysis; "working fine now" (12/20/11)  . Renal cell carcinoma    Past Surgical History  Procedure Laterality Date  . Cesarean section  1984  . Nephrectomy transplanted organ  2006    bilaterally  . Colectomy  1998    "removal of abscess" (04/25/2012)  . Excisional hemorrhoidectomy    . Cataract extraction w/ intraocular lens  implant, bilateral  2005-2010  . Vena cava filter placement  2012  . Insertion of dialysis catheter   1988-2006    "peritoneal and hemodialysis; had multiple grafts and fistulas; last graft clotted off 2012"  . Cholecystectomy  1995  . Colostomy  1998  . Colostomy reversal  1999    "6 months after placed" (04/25/2012)  . Bone marrow biopsy  1993; 1995  . Abdominal exploration surgery  1995    "found sluggish bone marrow" (04/25/2012)  . Parathyroid implant removal Left     removed parathyroid from neck and implanted in arm  . Colonoscopy N/A 08/30/2013    Procedure: COLONOSCOPY;  Surgeon: Winfield Cunas., MD;  Location: [facility];  Service: Endoscopy;  Laterality: N/A;   Social History:  reports that she has never smoked. She has never used smokeless tobacco. She reports that she does not drink alcohol or use illicit drugs.  Allergies  Allergen Reactions  . Amoxicillin Anaphylaxis  . Ampicillin Anaphylaxis  . Ciprofloxacin Swelling    "of my throat"  . Erythromycin Anaphylaxis  . Penicillins Anaphylaxis  . Sulfa Antibiotics Itching  . Sulfamethoxazole-Trimethoprim Itching  . Tetracycline Anaphylaxis  . Labetalol Nausea And Vomiting    Family History  Problem Relation Age of Onset  . Heart disease Mother   . Hypertension      runs in family  . Sleep apnea Sister      Prior  to Admission medications   Medication Sig Start Date End Date Taking? Authorizing Provider  amiodarone (PACERONE) 200 MG tablet Take 100 mg by mouth 2 (two) times daily.     Historical Provider, MD  calcitRIOL (ROCALTROL) 0.25 MCG capsule Take 0.25 mcg by mouth every other day.     Historical Provider, MD  epoetin alfa (EPOGEN,PROCRIT) 01093 UNIT/ML injection Inject 40,000 Units into the skin every 30 (thirty) days.    Historical Provider, MD  gabapentin (NEURONTIN) 100 MG capsule Take 200 mg by mouth at bedtime.     Historical Provider, MD  levothyroxine (SYNTHROID, LEVOTHROID) 100 MCG tablet Take 100 mcg by mouth daily.    Historical Provider, MD  metoprolol (LOPRESSOR) 100 MG tablet Take 100 mg  by mouth 2 (two) times daily.    Historical Provider, MD  mycophenolate (MYFORTIC) 180 MG EC tablet Take 360 mg by mouth 2 (two) times daily.      Historical Provider, MD  omeprazole (PRILOSEC) 20 MG capsule Take 20 mg by mouth daily.     Historical Provider, MD  polyethylene glycol (MIRALAX / GLYCOLAX) packet Take 17 g by mouth 2 (two) times daily as needed. For constipation    Historical Provider, MD  polyethylene glycol-electrolytes (NULYTELY/GOLYTELY) 420 G solution  08/27/13   Historical Provider, MD  predniSONE (DELTASONE) 5 MG tablet Take 5 mg by mouth daily.     Historical Provider, MD  tacrolimus (PROGRAF) 1 MG capsule Take 1 mg by mouth 2 (two) times daily. Takes with 5 mg tablet    Historical Provider, MD  warfarin (COUMADIN) 5 MG tablet Take 2.5 mg by mouth daily. 2.5 mg every other day alternating with 1.5    Historical Provider, MD   Physical Exam: Filed Vitals:   03/04/14 2303  BP: 187/77  Pulse: 67  Temp: 98.6 F (37 C)  Resp: 20    BP 187/77  Pulse 67  Temp(Src) 98.6 F (37 C) (Oral)  Resp 20  Ht _0  (1.6 m)  Wt 60.328 kg (133 lb)  BMI 23.57 kg/m2  SpO2 96%  General Appearance:    Alert, oriented, no distress, appears stated age  Head:    Normocephalic, atraumatic  Eyes:    PERRL, EOMI, sclera non-icteric        Nose:   Nares without drainage or epistaxis. Mucosa, turbinates normal  Throat:   Moist mucous membranes. Oropharynx without erythema or exudate.  Neck:   Supple. No carotid bruits.  No thyromegaly.  No lymphadenopathy.   Back:     No CVA tenderness, no spinal tenderness  Lungs:     Clear to auscultation bilaterally, without wheezes, rhonchi or rales  Chest wall:    No tenderness to palpitation  Heart:    Regular rate and rhythm without murmurs, gallops, rubs  Abdomen:     Soft, non-tender, nondistended, normal bowel sounds, no organomegaly  Genitalia:    deferred  Rectal:    deferred  Extremities:   No clubbing, cyanosis or edema.  Pulses:   2+  and symmetric all extremities  Skin:   Skin color, texture, turgor normal, no rashes or lesions  Lymph nodes:   Cervical, supraclavicular, and axillary nodes normal  Neurologic:   CNII-XII intact. Normal strength, sensation and reflexes      throughout    Labs on Admission:  Basic Metabolic Panel:  Recent Labs Lab 03/04/14 2016  NA 139  K 4.7  CL 99  CO2 25  GLUCOSE 158*  BUN  42*  CREATININE 2.30*  CALCIUM 10.1   Liver Function Tests:  Recent Labs Lab 03/04/14 2016  AST 19  ALT 11  ALKPHOS 127*  BILITOT 0.3  PROT 7.3  ALBUMIN 4.0    Recent Labs Lab 03/04/14 2016  LIPASE 33   No results found for this basename: AMMONIA,  in the last 168 hours CBC:  Recent Labs Lab 03/04/14 2016  WBC 12.7*  HGB 10.9*  HCT 35.7*  MCV 95.2  PLT 129*   Cardiac Enzymes: No results found for this basename: CKTOTAL, CKMB, CKMBINDEX, TROPONINI,  in the last 168 hours  BNP (last 3 results) No results found for this basename: PROBNP,  in the last 8760 hours CBG: No results found for this basename: GLUCAP,  in the last 168 hours  Radiological Exams on Admission: Ct Abdomen Pelvis Wo Contrast  03/04/2014   CLINICAL DATA:  Abdominal pain, nausea and vomiting  EXAM: CT ABDOMEN AND PELVIS WITHOUT CONTRAST  TECHNIQUE: Multidetector CT imaging of the abdomen and pelvis was performed following the standard protocol without IV contrast.  COMPARISON:  04/23/2012  FINDINGS: Lower chest: Calcified plaque identified within the right lung base. Scarring is noted in both lower lobes. No pleural effusion. There is calcified atherosclerotic disease involving the RCA and left circumflex coronary arteries.  Hepatobiliary: Similar appearance of right hepatic lobe cyst measuring 5 mm, image 12/series 2. Previous cholecystectomy. Increase caliber of the common bile duct noted which measures up to 1 cm. No stone or mass identified.  Pancreas:  Unremarkable appearance of the pancreas.  Spleen: The spleen  is normal.  Adrenals/Urinary Tract: The adrenal glands are unremarkable. The patient is status post bilateral nephrectomy. A renal graft is identified within the right iliac fossa. The urinary bladder appears normal.  Stomach/Bowel: The stomach appears normal. The small bowel loops appear increased in caliber 4.7 cm transition to decreased caliber distal small bowel loops identified within the right lower quadrant, image 44/series 5. Multiple colonic diverticula are identified. No evidence for acute diverticulitis.  Vascular/Lymphatic: Calcified atherosclerotic disease involves the abdominal aorta. No aneurysm. There is a IVC filter in place. No retroperitoneal adenopathy. No small bowel mesenteric adenopathy. No enlarged pelvic or inguinal lymph nodes.  Reproductive: The uterus and the adnexal structures are unremarkable for patient's age.  Other: No free fluid or fluid collections within the abdomen or pelvis. There is a small ventral abdominal wall hernia which contains fat only, image number 24/series 2.  Musculoskeletal: Review of the visualized bony structures is on unremarkable. No aggressive lytic or sclerotic bone lesions identified.  IMPRESSION: 1. Abnormal small bowel dilatation concerning for high-grade partial small bowel obstruction. The transition point is in the right lower quadrant of the abdomen proximal to the terminal ileum. 2. No evidence for perforation. 3. Status post bilateral nephrectomy with right lower quadrant renal graft. 4. Previous cholecystectomy. There is abnormal increased caliber of the common bile duct. No stone or mass noted. 5. Atherosclerotic disease. 6. Calcified pleural plaque in the right base may be due to previous asbestos exposure.   Electronically Signed   By: Kerby Moors M.D.   On: 03/04/2014 22:46    EKG: Independently reviewed.  Assessment/Plan Principal Problem:   SBO (small bowel obstruction) Active Problems:   History of renal transplant   Crohn's  disease   1. SBO - patient with history of admissions for same, usually resolves with conservative management 1. NPO except meds 2. NGT to low intermittent suction except clamp when  meds given and for short period after 1. Patient also reports that this is what has typically been done in the past during past admits. 3. Will need to continue PO meds as patient has anti-rejection drugs that cannot be skipped for her renal transplant history 4. IVF to prevent dehydration 5. Consult gen surgery if symptoms do not improve, but patient has never required lysis of adhesions or other surgical intervention in past for SBO she states 2. H/O renal transplant 1. Kidney function remains at baseline 2. Monitor closely 3. Continue anti-rejection drugs. 3. HTN - continue home meds 4. A.Fib - continue coumadin, remains in sinus rhythm controlled with amiodarone, dont really have a reason to put patient on tele at this point.    Code Status: Full Code  Family Communication: No family in room Disposition Plan: Admit to inpatient  Time spent: 70 min  Duwayne Matters M. [facility] Pager [phone]  If 7AM-7PM, please contact the day team taking care of the patient [email or web address] Password [email or web address] 03/05/2014, 1:29 AM

## 2014-03-05 NOTE — Progress Notes (Signed)
0100 Pt 43 yrs  AA  female admitted from [place] via care link  withth DX SBO .RN introduce self and pt oriented to room and use of  Call light for assist . NGT to lLnare to LWIS . Pt denied pain at this time.  Admitting MD paged.Marland Kitchen   Marland Kitchen

## 2014-03-06 ENCOUNTER — Inpatient Hospital Stay (HOSPITAL_COMMUNITY): Payer: Medicare Other

## 2014-03-06 ENCOUNTER — Encounter (HOSPITAL_COMMUNITY): Payer: Medicare Other

## 2014-03-06 DIAGNOSIS — N179 Acute kidney failure, unspecified: Secondary | ICD-10-CM | POA: Diagnosis not present

## 2014-03-06 LAB — BASIC METABOLIC PANEL
ANION GAP: 18 — AB (ref 5–15)
Anion gap: 15 (ref 5–15)
BUN: 35 mg/dL — ABNORMAL HIGH (ref 6–23)
BUN: 34 mg/dL — ABNORMAL HIGH (ref 6–23)
CHLORIDE: 102 meq/L (ref 96–112)
CO2: 20 meq/L (ref 19–32)
CO2: 18 meq/L — AB (ref 19–32)
Calcium: 9.2 mg/dL (ref 8.4–10.5)
Calcium: 9.2 mg/dL (ref 8.4–10.5)
Chloride: 101 mEq/L (ref 96–112)
Creatinine, Ser: 2.15 mg/dL — ABNORMAL HIGH (ref 0.50–1.10)
Creatinine, Ser: 2.01 mg/dL — ABNORMAL HIGH (ref 0.50–1.10)
GFR calc Af Amer: 26 mL/min — ABNORMAL LOW (ref >90)
GFR calc non Af Amer: 22 mL/min — ABNORMAL LOW (ref >90)
GFR calc non Af Amer: 24 mL/min — ABNORMAL LOW (ref >90)
GFR, EST AFRICAN AMERICAN: 28 mL/min — AB (ref >90)
GLUCOSE: 79 mg/dL (ref 70–99)
Glucose, Bld: 93 mg/dL (ref 70–99)
Potassium: 4.7 mEq/L (ref 3.7–5.3)
Potassium: 5.1 mEq/L (ref 3.7–5.3)
SODIUM: 137 meq/L (ref 137–147)
Sodium: 137 mEq/L (ref 137–147)

## 2014-03-06 LAB — PROTIME-INR
INR: 1.97 — AB (ref 0.00–1.49)
PROTHROMBIN TIME: 22.6 s — AB (ref 11.6–15.2)

## 2014-03-06 LAB — CBC
HCT: 34 % — ABNORMAL LOW (ref 36.0–46.0)
HEMOGLOBIN: 10.5 g/dL — AB (ref 12.0–15.0)
MCH: 28.5 pg (ref 26.0–34.0)
MCHC: 30.9 g/dL (ref 30.0–36.0)
MCV: 92.4 fL (ref 78.0–100.0)
Platelets: 128 10*3/uL — ABNORMAL LOW (ref 150–400)
RBC: 3.68 MIL/uL — AB (ref 3.87–5.11)
RDW: 14.9 % (ref 11.5–15.5)
WBC: 8.5 10*3/uL (ref 4.0–10.5)

## 2014-03-06 LAB — HEPARIN LEVEL (UNFRACTIONATED)
Heparin Unfractionated: 0.83 IU/mL — ABNORMAL HIGH (ref 0.30–0.70)
Heparin Unfractionated: <0.1 IU/mL — ABNORMAL LOW (ref 0.30–0.70)

## 2014-03-06 MED ORDER — ZOLPIDEM TARTRATE 5 MG PO TABS
5.0000 mg | ORAL_TABLET | Freq: Once | ORAL | Status: AC
  Administered 2014-03-06: 5 mg via ORAL
  Filled 2014-03-06: qty 1

## 2014-03-06 MED ORDER — MYCOPHENOLATE MOFETIL 250 MG PO CAPS
500.0000 mg | ORAL_CAPSULE | Freq: Two times a day (BID) | ORAL | Status: DC
  Administered 2014-03-06 – 2014-03-07 (×2): 500 mg via ORAL
  Filled 2014-03-06 (×3): qty 2

## 2014-03-06 NOTE — Progress Notes (Signed)
PROGRESS NOTE  [name] DJM:[id number] DOB: Sep 01, 1940 DOA: 03/04/2014 PCP: Placido Sou, MD  HPI/Recap of past 81 hours: 73 year old female with past medical history of renal transplant plus bowel resection from Crohn's disease whose had a number of admissions in the past for partial small bowel obstruction that have not once required surgical intervention presented to the emergency room on the evening of 10/27 with complaints of abdominal cramping and pain with some associated nausea and vomiting. CT scan done noted partial small bowel obstruction and patient was admitted to the hospitalist service.  Patient has been doing well with just finding NG tube discomforting. Follow-up x-ray done 10/29 nodes no evidence of obstruction and contrast has advanced. NG tube clamped this morning  Assessment/Plan: Principal Problem:   SBO (small bowel obstruction): Have changed medications over to IV. Patient started having some flatus With x-rays improved, have clamped NG tube. We'll follow-up and if tolerating, will remove NG tube this afternoon and start clear liquids Active Problems:   Atrial fibrillation: Anticoagulation chronic. Changed to heparin. On IV Lopressor. Amiodarone held as this has a long half-life   History of renal transplant: Appreciate pharmacy assistance in converting medications. Prograf changed to subcutaneous. Mycophenolate changed IV.  Acute renal failure: Patient's creatinine jumped up this morning, possibly from prerenal azotemia. Increased IV fluids to 125 and repeat creatinine this afternoon noted some improvement from 2.15 down to 2. Recheck tomorrow.    HTN (hypertension): Lopressor changed IV    Crohn's disease: Stable. On low-dose  prednisone, converted to IV Solu-Medrol and dose increased for stress dose  Hypothyroidism: Change Synthroid to by mouth  History of DVT: On chronic Coumadin. Coumadin held and heparin per pharmacy  Code Statusfull  code Family Communication:  left message with family   Disposition Plan: Home once able to tolerate by mouth solids, likely tomorrow  Consultants: None.  Procedures: None  Antibiotics: None  Objective: BP 154/72  Pulse 67  Temp(Src) 98.2 F (36.8 C) (Oral)  Resp 16  Ht 5' 3"  (1.6 m)  Wt 70.8 kg (156 lb 1.4 oz)  BMI 27.66 kg/m2  SpO2 98%  LMP 03/05/2014  Intake/Output Summary (Last 24 hours) at 03/06/14 1542 Last data filed at 03/06/14 1131  Gross per 24 hour  Intake    670 ml  Output    790 ml  Net   -120 ml   Filed Weights   03/04/14 1938 03/05/14 0045  Weight: 60.328 kg (133 lb) 70.8 kg (156 lb 1.4 oz)    Exam:   GeneraAlert and oriented 3, no acute distress  CardioRegular rate and rhythm, S1-S2  RespirClear to auscultation bilaterally  AbdomeSoft, nontender, nondistended, scant bowel sounds  Musculoskeletal: No clubbing or cyanosis or edema  Data Reviewed: Basic Metabolic Panel:  Recent Labs Lab 03/04/14 2016 03/05/14 0650 03/06/14 0556 03/06/14 1420  NA 139 139 137 137  K 4.7 4.2 4.7 5.1  CL 99 107 102 101  CO2 25 21 20  18*  GLUCOSE 158* 73 79 93  BUN 42* 32* 35* 34*  CREATININE 2.30* 1.69* 2.15* 2.01*  CALCIUM 10.1 7.7* 9.2 9.2   Liver Function Tests:  Recent Labs Lab 03/04/14 2016  AST 19  ALT 11  ALKPHOS 127*  BILITOT 0.3  PROT 7.3  ALBUMIN 4.0    Recent Labs Lab 03/04/14 2016  LIPASE 33   No results found for this basename: AMMONIA,  in the last 168 hours CBC:  Recent Labs Lab 03/04/14 2016 03/05/14 0650  03/06/14 0556  WBC 12.7* 10.7* 8.5  HGB 10.9* 9.3* 10.5*  HCT 35.7* 30.0* 34.0*  MCV 95.2 91.7 92.4  PLT 129* 114* 128*   Cardiac Enzymes:   No results found for this basename: CKTOTAL, CKMB, CKMBINDEX, TROPONINI,  in the last 168 hours BNP (last 3 results) No results found for this basename: PROBNP,  in the last 8760 hours CBG: No results found for this basename: GLUCAP,  in the last 168 hours  No  results found for this or any previous visit (from the past 240 hour(s)).   Studies: No results found.  Scheduled Meds: . levothyroxine  50 mcg Intravenous QAC breakfast  . methylPREDNISolone (SOLU-MEDROL) injection  15.2 mg Intravenous Daily  . metoprolol  2.5 mg Intravenous 4 times per day  . mycophenolate (CELLCEPT) IV  500 mg Intravenous Q12H  . pantoprazole (PROTONIX) IV  40 mg Intravenous Q24H  . tacrolimus  2 mg Sublingual QHS  . tacrolimus  2.5 mg Sublingual q morning - 10a    Continuous Infusions: . sodium chloride 125 mL/hr at 03/06/14 0847  . heparin 750 Units/hr (03/06/14 1660)     Time spent:25 minutes  Annita Brod  [facility] Pager [phone]  If 7PM-7AM, please contact night-coverage at [email or web address], password [facility] 03/06/2014, 3:42 PM  LOS: 2 days

## 2014-03-06 NOTE — Progress Notes (Signed)
ANTICOAGULATION CONSULT NOTE - Follow Up Consult  Pharmacy Consult for heparin Indication: atrial fibrillation  Labs:  Recent Labs  03/04/14 2016 03/04/14 2259 03/05/14 0650 03/05/14 2003 03/06/14 0556  HGB 10.9*  --  9.3*  --  10.5*  HCT 35.7*  --  30.0*  --  34.0*  PLT 129*  --  114*  --  128*  LABPROT  --  20.1* 21.1*  --  22.6*  INR  --  1.71* 1.81*  --  1.97*  HEPARINUNFRC  --   --   --  0.68 0.83*  CREATININE 2.30*  --  1.69*  --  2.15*    Assessment: 73yo female now supratherapeutic on heparin despite rate decrease last pm.  Goal of Therapy:  Heparin level 0.3-0.7 units/ml   Plan:  Will decrease heparin gtt by 1-2 units/kg/hr to 750 units/hr and check level in [place], PharmD, BCPS  03/06/2014,6:49 AM

## 2014-03-06 NOTE — Progress Notes (Signed)
Cellcept IV has to be given, but pt refused to have it given in an alternative IV site.  Heparin gtt is infusing in R FA.  Requested to stop heparin gtt to give cellcept.  Dr. Maryland Pink aware and okay to pause heparin for now.  Informed Mickel Baas in pharmacy.  Will continue to monitor.

## 2014-03-06 NOTE — Progress Notes (Signed)
ANTICOAGULATION CONSULT NOTE - [place] for heparin Indication: atrial fibrillation  Allergies  Allergen Reactions  . Amoxicillin Anaphylaxis  . Ampicillin Anaphylaxis  . Ciprofloxacin Swelling    "of my throat"  . Erythromycin Anaphylaxis  . Penicillins Anaphylaxis  . Sulfa Antibiotics Itching  . Sulfamethoxazole-Trimethoprim Itching  . Tetracycline Anaphylaxis  . Labetalol Nausea And Vomiting    Patient Measurements: Height: 5' 3"  (160 cm) Weight: 156 lb 1.4 oz (70.8 kg) IBW/kg (Calculated) : 52.4 Heparin Dosing Weight: 67 kg  Vital Signs: Temp: 98 F (36.7 C) (10/29 2215) Temp Source: Oral (10/29 2215) BP: 146/70 mmHg (10/29 2215) Pulse Rate: 64 (10/29 2215)  Labs:  Recent Labs  03/04/14 2016 03/04/14 2259 03/05/14 0650 03/05/14 2003 03/06/14 0556 03/06/14 1420 03/06/14 2025  HGB 10.9*  --  9.3*  --  10.5*  --   --   HCT 35.7*  --  30.0*  --  34.0*  --   --   PLT 129*  --  114*  --  128*  --   --   LABPROT  --  20.1* 21.1*  --  22.6*  --   --   INR  --  1.71* 1.81*  --  1.97*  --   --   HEPARINUNFRC  --   --   --  0.68 0.83*  --  <0.10*  CREATININE 2.30*  --  1.69*  --  2.15* 2.01*  --     Estimated Creatinine Clearance: 24.6 ml/min (by C-G formula based on Cr of 2.01).   Medical History: Past Medical History  Diagnosis Date  . Crohn's disease   . Hypertension   . Refusal of blood transfusion for reasons of conscience 12/20/2011    pt is Je[employer]'s Witness  . Atrial fibrillation   . DVT of leg (deep venous thrombosis) 2012-8./13/2013    "have had one in each leg now"  . Numbness and tingling of foot     bilaterally  . GERD (gastroesophageal reflux disease)   . Hypothyroidism   . Lower GI bleed 2012  . DVT of lower extremity, bilateral     "have had them in both legs; last one was on the RLE this year" (04/25/2012)  . OSA on CPAP   . Anemia     "when lupus flares up"  . SLE (systemic lupus erythematosus)   .  Arthritis     "in my joints"  . Renal disorder     S/P nephrectomy; dialysis; "working fine now" (12/20/11)  . Renal cell carcinoma     Medications:  Prescriptions prior to admission  Medication Sig Dispense Refill  . amiodarone (PACERONE) 200 MG tablet Take 100 mg by mouth 2 (two) times daily.       . calcitRIOL (ROCALTROL) 0.25 MCG capsule Take 0.25 mcg by mouth every other day.       Marland Kitchen epoetin alfa (EPOGEN,PROCRIT) 16109 UNIT/ML injection Inject 40,000 Units into the skin every 21 ( twenty-one) days. Due Thursday 03/06/14      . gabapentin (NEURONTIN) 100 MG capsule Take 200 mg by mouth at bedtime.       Marland Kitchen levothyroxine (SYNTHROID, LEVOTHROID) 100 MCG tablet Take 100 mcg by mouth daily.      . metoprolol (LOPRESSOR) 100 MG tablet Take 100 mg by mouth 2 (two) times daily.      . mycophenolate (MYFORTIC) 180 MG EC tablet Take 360 mg by mouth 2 (two) times daily.        Marland Kitchen  omeprazole (PRILOSEC) 20 MG capsule Take 20 mg by mouth daily.       . polyethylene glycol (MIRALAX / GLYCOLAX) packet Take 17 g by mouth 2 (two) times daily as needed. For constipation      . polyvinyl alcohol-povidone (HYPOTEARS) 1.4-0.6 % ophthalmic solution Place 1-2 drops into both eyes 4 (four) times daily as needed (dry eyes).      . predniSONE (DELTASONE) 5 MG tablet Take 5 mg by mouth daily.       . tacrolimus (PROGRAF) 1 MG capsule Take 4 mg by mouth every evening.       . tacrolimus (PROGRAF) 5 MG capsule Take 5 mg by mouth every morning.      . warfarin (COUMADIN) 2.5 MG tablet Take 1.25 mg by mouth every other day. Patient takes half of a 42m tablet every other day, alternating with half of a 2.5 mg tablet      . warfarin (COUMADIN) 5 MG tablet Take 2.5 mg by mouth every other day. Patient takes half of a 583mtablet every other day, alternating with half of a 2.5 mg tablet        Assessment: [age]/o female with Crohn's disease, renal transplant, and hx of multiple admissions for SBO who presented to the ED  today with abdominal pain. CT abd/pelvis reveals SBO.  Pharmacy consulted to begin IV heparin for Afib while warfarin is on hold.   Repeat heparin level is undetectable on heparin 750 units/hr. Pt had been on this dose for 6 hours with no discontinuation or any issue with IV access. Prior to this, the heparin had been stopped for 2 hours while mycophenolate infused as okayed by the doctor.   Goal of Therapy:  Heparin level 0.3-0.7 units/ml Monitor platelets by anticoagulation protocol: Yes   Plan:  - Increase heparin drip to 850 units/hr - Check 6h heparin level with AM labs - Daily heparin level and CBC  CoAndrey CotaBaDiona FoleyPharmD Clinical Pharmacist Pager [phone]/29/2015 10:18 PM

## 2014-03-07 DIAGNOSIS — N183 Chronic kidney disease, stage 3 (moderate): Secondary | ICD-10-CM

## 2014-03-07 LAB — CBC
HCT: 33.6 % — ABNORMAL LOW (ref 36.0–46.0)
Hemoglobin: 10.3 g/dL — ABNORMAL LOW (ref 12.0–15.0)
MCH: 28.5 pg (ref 26.0–34.0)
MCHC: 30.7 g/dL (ref 30.0–36.0)
MCV: 93.1 fL (ref 78.0–100.0)
Platelets: 126 10*3/uL — ABNORMAL LOW (ref 150–400)
RBC: 3.61 MIL/uL — ABNORMAL LOW (ref 3.87–5.11)
RDW: 14.9 % (ref 11.5–15.5)
WBC: 6.5 10*3/uL (ref 4.0–10.5)

## 2014-03-07 LAB — BASIC METABOLIC PANEL
Anion gap: 16 — ABNORMAL HIGH (ref 5–15)
BUN: 35 mg/dL — ABNORMAL HIGH (ref 6–23)
CHLORIDE: 105 meq/L (ref 96–112)
CO2: 17 mEq/L — ABNORMAL LOW (ref 19–32)
CREATININE: 2.05 mg/dL — AB (ref 0.50–1.10)
Calcium: 9.2 mg/dL (ref 8.4–10.5)
GFR, EST AFRICAN AMERICAN: 27 mL/min — AB (ref >90)
GFR, EST NON AFRICAN AMERICAN: 23 mL/min — AB (ref >90)
Glucose, Bld: 77 mg/dL (ref 70–99)
Potassium: 4.5 mEq/L (ref 3.7–5.3)
Sodium: 138 mEq/L (ref 137–147)

## 2014-03-07 LAB — HEPARIN LEVEL (UNFRACTIONATED): Heparin Unfractionated: 0.25 IU/mL — ABNORMAL LOW (ref 0.30–0.70)

## 2014-03-07 LAB — PROTIME-INR
INR: 2.27 — ABNORMAL HIGH (ref 0.00–1.49)
Prothrombin Time: 25.2 seconds — ABNORMAL HIGH (ref 11.6–15.2)

## 2014-03-07 MED ORDER — TACROLIMUS 1 MG PO CAPS: 5.0000 mg | ORAL_CAPSULE | Freq: Every morning | ORAL | Status: DC

## 2014-03-07 MED ORDER — TACROLIMUS 1 MG PO CAPS
4.0000 mg | ORAL_CAPSULE | Freq: Every evening | ORAL | Status: DC
  Filled 2014-03-07: qty 4

## 2014-03-07 MED ORDER — POLYETHYLENE GLYCOL 3350 17 G PO PACK
17.0000 g | PACK | Freq: Every day | ORAL | Status: DC
  Filled 2014-03-07: qty 1

## 2014-03-07 MED ORDER — MYCOPHENOLATE SODIUM 180 MG PO TBEC
360.0000 mg | DELAYED_RELEASE_TABLET | Freq: Two times a day (BID) | ORAL | Status: DC
  Filled 2014-03-07: qty 2

## 2014-03-07 MED ORDER — AMIODARONE HCL 100 MG PO TABS
100.0000 mg | ORAL_TABLET | Freq: Two times a day (BID) | ORAL | Status: DC
  Filled 2014-03-07: qty 1

## 2014-03-07 MED ORDER — TACROLIMUS 1 MG PO CAPS: 6.0000 mg | ORAL_CAPSULE | Freq: Two times a day (BID) | ORAL | Status: DC

## 2014-03-07 MED ORDER — PREDNISONE 5 MG PO TABS: 5.0000 mg | ORAL_TABLET | Freq: Every day | ORAL | Status: DC

## 2014-03-07 MED ORDER — DARBEPOETIN ALFA-POLYSORBATE 200 MCG/0.4ML IJ SOLN
200.0000 ug | Freq: Once | INTRAMUSCULAR | Status: AC
  Administered 2014-03-07: 200 ug via SUBCUTANEOUS
  Filled 2014-03-07: qty 0.4

## 2014-03-07 MED ORDER — ENSURE COMPLETE PO LIQD
237.0000 mL | Freq: Two times a day (BID) | ORAL | Status: DC
  Administered 2014-03-07: 237 mL via ORAL

## 2014-03-07 MED ORDER — LEVOTHYROXINE SODIUM 100 MCG PO TABS
100.0000 ug | ORAL_TABLET | Freq: Every day | ORAL | Status: DC
  Filled 2014-03-07: qty 1

## 2014-03-07 MED ORDER — ACETAMINOPHEN 325 MG PO TABS
650.0000 mg | ORAL_TABLET | Freq: Four times a day (QID) | ORAL | Status: DC | PRN
  Administered 2014-03-07: 650 mg via ORAL
  Filled 2014-03-07: qty 2

## 2014-03-07 MED ORDER — GABAPENTIN 100 MG PO CAPS
200.0000 mg | ORAL_CAPSULE | Freq: Every day | ORAL | Status: DC
  Filled 2014-03-07: qty 2

## 2014-03-07 NOTE — Discharge Summary (Signed)
Discharge Summary  [name] GBT:[id number] DOB: January 30, 1941  PCP: Placido Sou, MD  Admit date: 03/04/2014 Discharge date: 03/07/2014  Time spent: 25 minutes  Recommendations for Outpatient Follow-up:  1. Patient will resume her normal medications as scheduled 2. Patient will follow-up with nephrology in the next one month. 3. Patient has been given a referral for [facility] surgery for evaluation of her hernia  Discharge Diagnoses:  Active Hospital Problems   Diagnosis Date Noted  . SBO (small bowel obstruction) 03/04/2014  . ARF (acute renal failure) 03/06/2014  . History of DVT (deep vein thrombosis) 03/05/2014  . Crohn's disease   . History of renal transplant 12/20/2011  . HTN (hypertension) 12/20/2011  . Atrial fibrillation 12/20/2011  . Hypothyroidism 12/20/2011    Resolved Hospital Problems   Diagnosis Date Noted Date Resolved  No resolved problems to display.    Discharge Condition: Improved, being discharged home  Diet recommendation: Heart healthy  Filed Weights   03/04/14 1938 03/05/14 0045  Weight: 60.328 kg (133 lb) 70.8 kg (156 lb 1.4 oz)    History of present illness:  73 year old female with past medical history of renal transplant plus bowel resection from Crohn's disease whose had a number of admissions in the past for partial small bowel obstruction that have not once required surgical intervention presented to the emergency room on the evening of 10/27 with complaints of abdominal cramping and pain with some associated nausea and vomiting. CT scan done noted partial small bowel obstruction and patient was admitted to the hospitalist service  Hospital Course:  Principal Problem:   SBO (small bowel obstruction): Patient made nothing by mouth. By following day, abdominal x-ray noted no further obstruction. NG tube which was placed on admission was clamped, then discontinued. Then clear liquids started and diet slowly advanced. By  10/30 afternoon, patient tolerating solid food. During her nothing by mouth status, her by mouth medications were changed to IV and then change back to by mouth when she was able to tolerate clear liquids. Active Problems:   Atrial fibrillation: Rate controlled with beta blocker. Anticoagulation as mentioned below.    History of renal transplant: Patient's Prograf and mycophenolate were changed to IV and subcutaneous while she was nothing by mouth. Able to continue on appropriate doses. Creatinine has remained stable and is at her baseline around 2    HTN (hypertension): Patient continued on antihypertensives, blood pressure has remained stable   Hypothyroidism: On IV Synthroid while nothing by mouth, changed back to by mouth   Crohn's disease: On chronic low-dose steroids. Patient changed over Solu-Medrol at stress dose level, since on for a few days, G Will change back to normal by mouth prednisone 5 mg daily by discharge   History of DVT (deep vein thrombosis): INR remained therapeutic. During nothing by mouth status, patient on IV heparin. INR on day of discharge at 2.2    Procedures:  None  Consultations:  Case discussed with general surgery  Discharge Exam: BP 141/65  Pulse 72  Temp(Src) 98.1 F (36.7 C) (Oral)  Resp 16  Ht 5' 3"  (1.6 m)  Wt 70.8 kg (156 lb 1.4 oz)  BMI 27.66 kg/m2  SpO2 96%  LMP 03/05/2014  General: Alert and oriented 3, no acute distress Cardiovascular: Regular rate and rhythm, S1-S2 Respiratory: Clear to auscultation bilaterally  Discharge Instructions You were cared for by a hospitalist during your hospital stay. If you have any questions about your discharge medications or the care you received while  you were in the hospital after you are discharged, you can call the unit and asked to speak with the hospitalist on call if the hospitalist that took care of you is not available. Once you are discharged, your primary care physician will handle any  further medical issues. Please note that NO REFILLS for any discharge medications will be authorized once you are discharged, as it is imperative that you return to your primary care physician (or establish a relationship with a primary care physician if you do not have one) for your aftercare needs so that they can reassess your need for medications and monitor your lab values.     Medication List         amiodarone 200 MG tablet  Commonly known as:  PACERONE  Take 100 mg by mouth 2 (two) times daily.     calcitRIOL 0.25 MCG capsule  Commonly known as:  ROCALTROL  Take 0.25 mcg by mouth every other day.     epoetin alfa 40000 UNIT/ML injection  Commonly known as:  EPOGEN,PROCRIT  Inject 40,000 Units into the skin every 21 ( twenty-one) days. Due Thursday 03/06/14     gabapentin 100 MG capsule  Commonly known as:  NEURONTIN  Take 200 mg by mouth at bedtime.     levothyroxine 100 MCG tablet  Commonly known as:  SYNTHROID, LEVOTHROID  Take 100 mcg by mouth daily.     metoprolol 100 MG tablet  Commonly known as:  LOPRESSOR  Take 100 mg by mouth 2 (two) times daily.     mycophenolate 180 MG EC tablet  Commonly known as:  MYFORTIC  Take 360 mg by mouth 2 (two) times daily.     omeprazole 20 MG capsule  Commonly known as:  PRILOSEC  Take 20 mg by mouth daily.     polyethylene glycol packet  Commonly known as:  MIRALAX / GLYCOLAX  Take 17 g by mouth 2 (two) times daily as needed. For constipation     polyvinyl alcohol-povidone 1.4-0.6 % ophthalmic solution  Commonly known as:  HYPOTEARS  Place 1-2 drops into both eyes 4 (four) times daily as needed (dry eyes).     predniSONE 5 MG tablet  Commonly known as:  DELTASONE  Take 5 mg by mouth daily.     tacrolimus 5 MG capsule  Commonly known as:  PROGRAF  Take 5 mg by mouth every morning.     tacrolimus 1 MG capsule  Commonly known as:  PROGRAF  Take 4 mg by mouth every evening.     warfarin 2.5 MG tablet  Commonly  known as:  COUMADIN  Take 1.25 mg by mouth every other day. Patient takes half of a 32m tablet every other day, alternating with half of a 2.5 mg tablet     warfarin 5 MG tablet  Commonly known as:  COUMADIN  Take 2.5 mg by mouth every other day. Patient takes half of a 561mtablet every other day, alternating with half of a 2.5 mg tablet       Allergies  Allergen Reactions  . Amoxicillin Anaphylaxis  . Ampicillin Anaphylaxis  . Ciprofloxacin Swelling    "of my throat"  . Erythromycin Anaphylaxis  . Penicillins Anaphylaxis  . Sulfa Antibiotics Itching  . Sulfamethoxazole-Trimethoprim Itching  . Tetracycline Anaphylaxis  . Labetalol Nausea And Vomiting      The results of significant diagnostics from this hospitalization (including imaging, microbiology, ancillary and laboratory) are listed below for reference.  Significant Diagnostic Studies: Ct Abdomen Pelvis Wo Contrast  03/04/2014    IMPRESSION: 1. Abnormal small bowel dilatation concerning for high-grade partial small bowel obstruction. The transition point is in the right lower quadrant of the abdomen proximal to the terminal ileum. 2. No evidence for perforation. 3. Status post bilateral nephrectomy with right lower quadrant renal graft. 4. Previous cholecystectomy. There is abnormal increased caliber of the common bile duct. No stone or mass noted. 5. Atherosclerotic disease. 6. Calcified pleural plaque in the right base may be due to previous asbestos exposure.   Electronically Signed   By: Kerby Moors M.D.   On: 03/04/2014 22:46   Dg Abd Portable 1v  03/06/2014     IMPRESSION: Nonspecific nonobstructive bowel gas pattern. Contrast material from recent CT scan noted in distal colon. IVC filter in place. NG tube within stomach   Electronically Signed   By: Lahoma Crocker M.D.   On: 03/06/2014 09:27    Microbiology: No results found for this or any previous visit (from the past 240 hour(s)).   Labs: Basic Metabolic  Panel:  Recent Labs Lab 03/04/14 2016 03/05/14 0650 03/06/14 0556 03/06/14 1420 03/07/14 0421  NA 139 139 137 137 138  K 4.7 4.2 4.7 5.1 4.5  CL 99 107 102 101 105  CO2 25 21 20  18* 17*  GLUCOSE 158* 73 79 93 77  BUN 42* 32* 35* 34* 35*  CREATININE 2.30* 1.69* 2.15* 2.01* 2.05*  CALCIUM 10.1 7.7* 9.2 9.2 9.2   Liver Function Tests:  Recent Labs Lab 03/04/14 2016  AST 19  ALT 11  ALKPHOS 127*  BILITOT 0.3  PROT 7.3  ALBUMIN 4.0    Recent Labs Lab 03/04/14 2016  LIPASE 33   No results found for this basename: AMMONIA,  in the last 168 hours CBC:  Recent Labs Lab 03/04/14 2016 03/05/14 0650 03/06/14 0556 03/07/14 0421  WBC 12.7* 10.7* 8.5 6.5  HGB 10.9* 9.3* 10.5* 10.3*  HCT 35.7* 30.0* 34.0* 33.6*  MCV 95.2 91.7 92.4 93.1  PLT 129* 114* 128* 126*   Cardiac Enzymes: No results found for this basename: CKTOTAL, CKMB, CKMBINDEX, TROPONINI,  in the last 168 hours BNP: BNP (last 3 results) No results found for this basename: PROBNP,  in the last 8760 hours CBG: No results found for this basename: GLUCAP,  in the last 168 hours     Signed:  Annita Brod  [facility] 03/07/2014, 2:48 PM

## 2014-03-07 NOTE — Progress Notes (Signed)
NUTRITION FOLLOW UP  Intervention:   -Ensure Complete po BID, each supplement provides 350 kcal and 13 grams of protein  Nutrition Dx:   Inadequate oral intake related to SBO as evidenced by 25% meal completion; ongoing.   Goal:   Pt to meet >/= 90% of their estimated nutrition needs   Monitor:   Diet advancement, PO intake, weight trend, labs  Assessment:   73 y.o. female with PMH of Crohn's disease, Renal transplant, colostomy and reversal after a bowel resection in 1999, multiple admissions for PSBO in the past. Patient presents to the ED at [facility] with c/o gradual onset midepigastric abdominal pain.   NGT removed on 03/06/14. Pt was started on liquids yesterday. Noted poor intake (PO: 25%). Noted diet will be upgraded to 2 gm Sodium at lunch time today. Will add supplement to support nutritional adequacy.  Labs reviewed. Cl: 17, BUN/Creat:35/2.05.   Height: Ht Readings from Last 1 Encounters:  03/05/14 5' 3"  (1.6 m)    Weight Status:   Wt Readings from Last 1 Encounters:  03/05/14 156 lb 1.4 oz (70.8 kg)    Re-estimated needs:  Kcal: 1450-1650  Protein: 70-80 grams  Fluid: 1.2-1.6 L/day  Skin: Inact  Diet Order: Sodium Restricted   Intake/Output Summary (Last 24 hours) at 03/07/14 0925 Last data filed at 03/06/14 1950  Gross per 24 hour  Intake    205 ml  Output      0 ml  Net    205 ml    Last BM: 03/05/14   Labs:   Recent Labs Lab 03/06/14 0556 03/06/14 1420 03/07/14 0421  NA 137 137 138  K 4.7 5.1 4.5  CL 102 101 105  CO2 20 18* 17*  BUN 35* 34* 35*  CREATININE 2.15* 2.01* 2.05*  CALCIUM 9.2 9.2 9.2  GLUCOSE 79 93 77    CBG (last 3)  No results found for this basename: GLUCAP,  in the last 72 hours  Scheduled Meds: . feeding supplement (ENSURE COMPLETE)  237 mL Oral BID BM  . levothyroxine  50 mcg Intravenous QAC breakfast  . methylPREDNISolone (SOLU-MEDROL) injection  15.2 mg Intravenous Daily  . metoprolol  2.5 mg Intravenous 4 times  per day  . mycophenolate  500 mg Oral BID  . pantoprazole (PROTONIX) IV  40 mg Intravenous Q24H  . tacrolimus  2 mg Sublingual QHS  . tacrolimus  2.5 mg Sublingual q morning - 10a    Continuous Infusions: . sodium chloride 125 mL/hr at 03/06/14 1856    Gilverto Dileonardo A. Jimmye Norman, RD, LDN Pager: [phone] After hours Pager: [phone]

## 2014-03-07 NOTE — Progress Notes (Signed)
ANTICOAGULATION CONSULT NOTE - Follow Up Consult  Pharmacy Consult for Heparin (while warfarin on hold) Indication: atrial fibrillation  Allergies  Allergen Reactions  . Amoxicillin Anaphylaxis  . Ampicillin Anaphylaxis  . Ciprofloxacin Swelling    "of my throat"  . Erythromycin Anaphylaxis  . Penicillins Anaphylaxis  . Sulfa Antibiotics Itching  . Sulfamethoxazole-Trimethoprim Itching  . Tetracycline Anaphylaxis  . Labetalol Nausea And Vomiting    Patient Measurements: Height: 5' 3"  (160 cm) Weight: 156 lb 1.4 oz (70.8 kg) IBW/kg (Calculated) : 52.4  Vital Signs: Temp: 98.3 F (36.8 C) (10/30 0438) Temp Source: Oral (10/30 0438) BP: 165/71 mmHg (10/30 0438) Pulse Rate: 63 (10/30 0438)  Labs:  Recent Labs  03/05/14 0650  03/06/14 0556 03/06/14 1420 03/06/14 2025 03/07/14 0421  HGB 9.3*  --  10.5*  --   --  10.3*  HCT 30.0*  --  34.0*  --   --  33.6*  PLT 114*  --  128*  --   --  126*  LABPROT 21.1*  --  22.6*  --   --  25.2*  INR 1.81*  --  1.97*  --   --  2.27*  HEPARINUNFRC  --   < > 0.83*  --  <0.10* 0.25*  CREATININE 1.69*  --  2.15* 2.01*  --  2.05*  < > = values in this interval not displayed.  Assessment: Heparin for afib while warfarin oh hold. INR has risen to >2 (2.27) despite last dose of warfarin on 10/28. Will hold heparin until INR is <2 (discussed plan with [facility])  Goal of Therapy:  Heparin level 0.3-0.7 units/ml Monitor platelets by anticoagulation protocol: Yes   Plan:  -Hold heparin for now -1800 INR -Re-start heparin when INR is <2  Narda Bonds 03/07/2014,5:53 AM

## 2014-03-07 NOTE — Care Management Note (Unsigned)
    Page 1 of 1   03/07/2014     4:16:05 PM CARE MANAGEMENT NOTE 03/07/2014  Patient:  [name], [name]   Account Number:  [id number]  Date Initiated:  03/07/2014  Documentation initiated by:  Tomi Bamberger  Subjective/Objective Assessment:   dx abd pain  admit- from home     Action/Plan:   Anticipated DC Date:  03/07/2014   Anticipated DC Plan:  [place]  CM consult      Choice offered to / List presented to:             Status of service:  In process, will continue to follow Medicare Important Message given?  YES (If response is "NO", the following Medicare IM given date fields will be blank) Date Medicare IM given:  03/07/2014 Medicare IM given by:  Tomi Bamberger Date Additional Medicare IM given:   Additional Medicare IM given by:    Discharge Disposition:    Per UR Regulation:  Reviewed for med. necessity/level of care/duration of stay  If discussed at [place] of Stay Meetings, dates discussed:    Comments:

## 2014-03-07 NOTE — Progress Notes (Signed)
Patient discharge teaching given, including activity, diet, follow-up appoints, and medications. Patient verbalized understanding of all discharge instructions. IV access was d/c'd. Vitals are stable. Skin is intact except as charted in most recent assessments. Pt to be escorted out by NT, to be driven home by family. 

## 2014-03-25 DIAGNOSIS — N184 Chronic kidney disease, stage 4 (severe): Secondary | ICD-10-CM | POA: Diagnosis not present

## 2014-03-25 DIAGNOSIS — Z940 Kidney transplant status: Secondary | ICD-10-CM | POA: Diagnosis not present

## 2014-03-25 DIAGNOSIS — Z23 Encounter for immunization: Secondary | ICD-10-CM | POA: Diagnosis not present

## 2014-03-25 DIAGNOSIS — I129 Hypertensive chronic kidney disease with stage 1 through stage 4 chronic kidney disease, or unspecified chronic kidney disease: Secondary | ICD-10-CM | POA: Diagnosis not present

## 2014-03-25 DIAGNOSIS — M329 Systemic lupus erythematosus, unspecified: Secondary | ICD-10-CM | POA: Diagnosis not present

## 2014-03-25 DIAGNOSIS — E782 Mixed hyperlipidemia: Secondary | ICD-10-CM | POA: Diagnosis not present

## 2014-03-25 DIAGNOSIS — I82409 Acute embolism and thrombosis of unspecified deep veins of unspecified lower extremity: Secondary | ICD-10-CM | POA: Diagnosis not present

## 2014-04-10 ENCOUNTER — Encounter (HOSPITAL_COMMUNITY)
Admission: RE | Admit: 2014-04-10 | Discharge: 2014-04-10 | Disposition: A | Discharge status: Home / Self Care | Payer: Medicare Other | Source: Ambulatory Visit | Attending: Nephrology | Admitting: Nephrology

## 2014-04-10 DIAGNOSIS — N183 Chronic kidney disease, stage 3 (moderate): Secondary | ICD-10-CM | POA: Insufficient documentation

## 2014-04-10 DIAGNOSIS — D631 Anemia in chronic kidney disease: Secondary | ICD-10-CM | POA: Insufficient documentation

## 2014-04-10 LAB — FERRITIN: Ferritin: 892 ng/mL — ABNORMAL HIGH (ref 10–291)

## 2014-04-10 LAB — RENAL FUNCTION PANEL
Albumin: 3.9 g/dL (ref 3.5–5.2)
Anion gap: 14 (ref 5–15)
BUN: 40 mg/dL — ABNORMAL HIGH (ref 6–23)
CO2: 26 mEq/L (ref 19–32)
Calcium: 10.1 mg/dL (ref 8.4–10.5)
Chloride: 99 mEq/L (ref 96–112)
Creatinine, Ser: 2.29 mg/dL — ABNORMAL HIGH (ref 0.50–1.10)
GFR calc Af Amer: 24 mL/min — ABNORMAL LOW (ref >90)
GFR calc non Af Amer: 20 mL/min — ABNORMAL LOW (ref >90)
Glucose, Bld: 93 mg/dL (ref 70–99)
Phosphorus: 4 mg/dL (ref 2.3–4.6)
Potassium: 4 mEq/L (ref 3.7–5.3)
Sodium: 139 mEq/L (ref 137–147)

## 2014-04-10 LAB — IRON AND TIBC
IRON: 92 ug/dL (ref 42–135)
Saturation Ratios: 44 % (ref 20–55)
TIBC: 211 ug/dL — ABNORMAL LOW (ref 250–470)
UIBC: 119 ug/dL — AB (ref 125–400)

## 2014-04-10 LAB — POCT HEMOGLOBIN-HEMACUE: Hemoglobin: 10.8 g/dL — ABNORMAL LOW (ref 12.0–15.0)

## 2014-04-10 MED ORDER — EPOETIN ALFA 10000 UNIT/ML IJ SOLN: 40000.0000 [IU] | INTRAMUSCULAR | Status: DC

## 2014-04-10 MED ORDER — EPOETIN ALFA 40000 UNIT/ML IJ SOLN
INTRAMUSCULAR | Status: AC
  Administered 2014-04-10: 40000 [IU] via SUBCUTANEOUS
  Filled 2014-04-10: qty 1

## 2014-04-17 DIAGNOSIS — K432 Incisional hernia without obstruction or gangrene: Secondary | ICD-10-CM | POA: Diagnosis not present

## 2014-04-23 ENCOUNTER — Other Ambulatory Visit: Payer: Self-pay | Admitting: Nurse Practitioner

## 2014-04-30 ENCOUNTER — Inpatient Hospital Stay (HOSPITAL_COMMUNITY): Admission: RE | Admit: 2014-04-30 | Payer: Medicare Other | Source: Ambulatory Visit

## 2014-05-09 DIAGNOSIS — M109 Gout, unspecified: Secondary | ICD-10-CM

## 2014-05-09 HISTORY — DX: Gout, unspecified: M10.9

## 2014-05-15 ENCOUNTER — Encounter (HOSPITAL_COMMUNITY)
Admission: RE | Admit: 2014-05-15 | Discharge: 2014-05-15 | Disposition: A | Discharge status: Home / Self Care | Payer: Medicare Other | Source: Ambulatory Visit | Attending: Nephrology | Admitting: Nephrology

## 2014-05-15 DIAGNOSIS — N183 Chronic kidney disease, stage 3 (moderate): Secondary | ICD-10-CM | POA: Diagnosis not present

## 2014-05-15 DIAGNOSIS — D631 Anemia in chronic kidney disease: Secondary | ICD-10-CM | POA: Diagnosis not present

## 2014-05-15 LAB — RENAL FUNCTION PANEL
Albumin: 4 g/dL (ref 3.5–5.2)
Anion gap: 15 (ref 5–15)
BUN: 39 mg/dL — AB (ref 6–23)
CO2: 23 mmol/L (ref 19–32)
CREATININE: 2.69 mg/dL — AB (ref 0.50–1.10)
Calcium: 10.5 mg/dL (ref 8.4–10.5)
Chloride: 99 mEq/L (ref 96–112)
GFR calc Af Amer: 19 mL/min — ABNORMAL LOW (ref >90)
GFR, EST NON AFRICAN AMERICAN: 17 mL/min — AB (ref >90)
Glucose, Bld: 92 mg/dL (ref 70–99)
Phosphorus: 4.2 mg/dL (ref 2.3–4.6)
Potassium: 4 mmol/L (ref 3.5–5.1)
Sodium: 137 mmol/L (ref 135–145)

## 2014-05-15 LAB — IRON AND TIBC
IRON: 97 ug/dL (ref 42–145)
SATURATION RATIOS: 48 % (ref 20–55)
TIBC: 203 ug/dL — AB (ref 250–470)
UIBC: 106 ug/dL — ABNORMAL LOW (ref 125–400)

## 2014-05-15 LAB — FERRITIN: FERRITIN: 930 ng/mL — AB (ref 10–291)

## 2014-05-15 LAB — POCT HEMOGLOBIN-HEMACUE: HEMOGLOBIN: 11.3 g/dL — AB (ref 12.0–15.0)

## 2014-05-15 MED ORDER — EPOETIN ALFA 40000 UNIT/ML IJ SOLN
INTRAMUSCULAR | Status: AC
  Administered 2014-05-15: 40000 [IU]
  Filled 2014-05-15: qty 1

## 2014-05-15 MED ORDER — EPOETIN ALFA 10000 UNIT/ML IJ SOLN: 40000.0000 [IU] | INTRAMUSCULAR | Status: DC

## 2014-05-20 DIAGNOSIS — Z940 Kidney transplant status: Secondary | ICD-10-CM | POA: Diagnosis not present

## 2014-06-05 ENCOUNTER — Encounter (HOSPITAL_COMMUNITY)
Admission: RE | Admit: 2014-06-05 | Discharge: 2014-06-05 | Disposition: A | Discharge status: Home / Self Care | Payer: Medicare Other | Source: Ambulatory Visit | Attending: Nephrology | Admitting: Nephrology

## 2014-06-05 DIAGNOSIS — N183 Chronic kidney disease, stage 3 (moderate): Secondary | ICD-10-CM | POA: Diagnosis not present

## 2014-06-05 DIAGNOSIS — D631 Anemia in chronic kidney disease: Secondary | ICD-10-CM | POA: Diagnosis not present

## 2014-06-05 LAB — RENAL FUNCTION PANEL
ALBUMIN: 3.9 g/dL (ref 3.5–5.2)
Anion gap: 8 (ref 5–15)
BUN: 41 mg/dL — ABNORMAL HIGH (ref 6–23)
CHLORIDE: 107 mmol/L (ref 96–112)
CO2: 24 mmol/L (ref 19–32)
Calcium: 9.3 mg/dL (ref 8.4–10.5)
Creatinine, Ser: 2.5 mg/dL — ABNORMAL HIGH (ref 0.50–1.10)
GFR calc Af Amer: 21 mL/min — ABNORMAL LOW (ref >90)
GFR calc non Af Amer: 18 mL/min — ABNORMAL LOW (ref >90)
Glucose, Bld: 84 mg/dL (ref 70–99)
POTASSIUM: 4.2 mmol/L (ref 3.5–5.1)
Phosphorus: 3.3 mg/dL (ref 2.3–4.6)
Sodium: 139 mmol/L (ref 135–145)

## 2014-06-05 MED ORDER — EPOETIN ALFA 10000 UNIT/ML IJ SOLN: 40000.0000 [IU] | INTRAMUSCULAR | Status: DC

## 2014-06-05 MED ORDER — EPOETIN ALFA 40000 UNIT/ML IJ SOLN
INTRAMUSCULAR | Status: AC
  Administered 2014-06-05: 40000 [IU]
  Filled 2014-06-05: qty 1

## 2014-06-06 LAB — POCT HEMOGLOBIN-HEMACUE: Hemoglobin: 10.8 g/dL — ABNORMAL LOW (ref 12.0–15.0)

## 2014-06-12 ENCOUNTER — Encounter (HOSPITAL_COMMUNITY): Payer: Medicare Other

## 2014-06-24 ENCOUNTER — Encounter (HOSPITAL_COMMUNITY)
Admission: RE | Admit: 2014-06-24 | Discharge: 2014-06-24 | Disposition: A | Discharge status: Home / Self Care | Payer: Medicare Other | Source: Ambulatory Visit | Attending: Nephrology | Admitting: Nephrology

## 2014-06-24 DIAGNOSIS — N183 Chronic kidney disease, stage 3 (moderate): Secondary | ICD-10-CM | POA: Diagnosis not present

## 2014-06-24 DIAGNOSIS — D631 Anemia in chronic kidney disease: Secondary | ICD-10-CM | POA: Diagnosis not present

## 2014-06-24 LAB — POCT HEMOGLOBIN-HEMACUE
Hemoglobin: 12.6 g/dL (ref 12.0–15.0)
Hemoglobin: 12.7 g/dL (ref 12.0–15.0)

## 2014-06-24 MED ORDER — EPOETIN ALFA 10000 UNIT/ML IJ SOLN: 40000.0000 [IU] | INTRAMUSCULAR | Status: DC

## 2014-06-25 LAB — IRON AND TIBC
IRON: 116 ug/dL (ref 42–145)
Saturation Ratios: 62 % — ABNORMAL HIGH (ref 20–55)
TIBC: 187 ug/dL — ABNORMAL LOW (ref 250–470)
UIBC: 71 ug/dL — AB (ref 125–400)

## 2014-06-25 LAB — FERRITIN: FERRITIN: 898 ng/mL — AB (ref 10–291)

## 2014-07-08 ENCOUNTER — Encounter (HOSPITAL_COMMUNITY): Payer: Medicare Other

## 2014-07-14 DIAGNOSIS — N2581 Secondary hyperparathyroidism of renal origin: Secondary | ICD-10-CM | POA: Diagnosis not present

## 2014-07-14 DIAGNOSIS — E039 Hypothyroidism, unspecified: Secondary | ICD-10-CM | POA: Diagnosis not present

## 2014-07-14 DIAGNOSIS — Z940 Kidney transplant status: Secondary | ICD-10-CM | POA: Diagnosis not present

## 2014-07-14 DIAGNOSIS — E782 Mixed hyperlipidemia: Secondary | ICD-10-CM | POA: Diagnosis not present

## 2014-07-24 ENCOUNTER — Encounter (HOSPITAL_COMMUNITY)
Admission: RE | Admit: 2014-07-24 | Discharge: 2014-07-24 | Disposition: A | Discharge status: Home / Self Care | Payer: Medicare Other | Source: Ambulatory Visit | Attending: Nephrology | Admitting: Nephrology

## 2014-07-24 DIAGNOSIS — N183 Chronic kidney disease, stage 3 (moderate): Secondary | ICD-10-CM | POA: Insufficient documentation

## 2014-07-24 DIAGNOSIS — D631 Anemia in chronic kidney disease: Secondary | ICD-10-CM | POA: Insufficient documentation

## 2014-07-24 LAB — RENAL FUNCTION PANEL
ANION GAP: 7 (ref 5–15)
Albumin: 3.8 g/dL (ref 3.5–5.2)
BUN: 52 mg/dL — ABNORMAL HIGH (ref 6–23)
CHLORIDE: 104 mmol/L (ref 96–112)
CO2: 27 mmol/L (ref 19–32)
CREATININE: 2.67 mg/dL — AB (ref 0.50–1.10)
Calcium: 9.9 mg/dL (ref 8.4–10.5)
GFR calc Af Amer: 20 mL/min — ABNORMAL LOW (ref >90)
GFR, EST NON AFRICAN AMERICAN: 17 mL/min — AB (ref >90)
Glucose, Bld: 94 mg/dL (ref 70–99)
PHOSPHORUS: 3.9 mg/dL (ref 2.3–4.6)
Potassium: 4 mmol/L (ref 3.5–5.1)
Sodium: 138 mmol/L (ref 135–145)

## 2014-07-24 LAB — IRON AND TIBC
Iron: 92 ug/dL (ref 42–145)
Saturation Ratios: 45 % (ref 20–55)
TIBC: 204 ug/dL — ABNORMAL LOW (ref 250–470)
UIBC: 112 ug/dL — ABNORMAL LOW (ref 125–400)

## 2014-07-24 LAB — FERRITIN: FERRITIN: 999 ng/mL — AB (ref 10–291)

## 2014-07-24 LAB — POCT HEMOGLOBIN-HEMACUE: HEMOGLOBIN: 10.3 g/dL — AB (ref 12.0–15.0)

## 2014-07-24 MED ORDER — EPOETIN ALFA 10000 UNIT/ML IJ SOLN: 40000.0000 [IU] | INTRAMUSCULAR | Status: DC

## 2014-07-25 MED ORDER — EPOETIN ALFA 40000 UNIT/ML IJ SOLN
INTRAMUSCULAR | Status: AC
  Administered 2014-07-24: 40000 [IU]
  Filled 2014-07-25: qty 1

## 2014-08-14 ENCOUNTER — Encounter (HOSPITAL_COMMUNITY): Payer: BLUE CROSS/BLUE SHIELD

## 2014-08-14 ENCOUNTER — Ambulatory Visit: Payer: Medicare Other | Admitting: Internal Medicine

## 2014-08-20 ENCOUNTER — Inpatient Hospital Stay (HOSPITAL_COMMUNITY)
Admission: EM | Admit: 2014-08-20 | Discharge: 2014-08-26 | DRG: 871 | Disposition: A | Discharge status: Home / Self Care | Payer: Medicare Other | Attending: Oncology | Admitting: Oncology

## 2014-08-20 ENCOUNTER — Emergency Department (HOSPITAL_COMMUNITY): Payer: Medicare Other

## 2014-08-20 ENCOUNTER — Encounter (HOSPITAL_COMMUNITY): Payer: Self-pay | Admitting: *Deleted

## 2014-08-20 DIAGNOSIS — Z7952 Long term (current) use of systemic steroids: Secondary | ICD-10-CM | POA: Diagnosis not present

## 2014-08-20 DIAGNOSIS — I4891 Unspecified atrial fibrillation: Secondary | ICD-10-CM | POA: Diagnosis present

## 2014-08-20 DIAGNOSIS — N184 Chronic kidney disease, stage 4 (severe): Secondary | ICD-10-CM | POA: Diagnosis not present

## 2014-08-20 DIAGNOSIS — R079 Chest pain, unspecified: Secondary | ICD-10-CM | POA: Diagnosis not present

## 2014-08-20 DIAGNOSIS — Z905 Acquired absence of kidney: Secondary | ICD-10-CM | POA: Diagnosis present

## 2014-08-20 DIAGNOSIS — Z881 Allergy status to other antibiotic agents status: Secondary | ICD-10-CM

## 2014-08-20 DIAGNOSIS — R000 Tachycardia, unspecified: Secondary | ICD-10-CM | POA: Diagnosis not present

## 2014-08-20 DIAGNOSIS — E46 Unspecified protein-calorie malnutrition: Secondary | ICD-10-CM | POA: Diagnosis present

## 2014-08-20 DIAGNOSIS — D899 Disorder involving the immune mechanism, unspecified: Secondary | ICD-10-CM

## 2014-08-20 DIAGNOSIS — R55 Syncope and collapse: Secondary | ICD-10-CM | POA: Diagnosis present

## 2014-08-20 DIAGNOSIS — Z940 Kidney transplant status: Secondary | ICD-10-CM | POA: Diagnosis not present

## 2014-08-20 DIAGNOSIS — A419 Sepsis, unspecified organism: Secondary | ICD-10-CM | POA: Diagnosis not present

## 2014-08-20 DIAGNOSIS — I120 Hypertensive chronic kidney disease with stage 5 chronic kidney disease or end stage renal disease: Secondary | ICD-10-CM | POA: Diagnosis present

## 2014-08-20 DIAGNOSIS — Z882 Allergy status to sulfonamides status: Secondary | ICD-10-CM | POA: Diagnosis not present

## 2014-08-20 DIAGNOSIS — Z888 Allergy status to other drugs, medicaments and biological substances status: Secondary | ICD-10-CM

## 2014-08-20 DIAGNOSIS — R0602 Shortness of breath: Secondary | ICD-10-CM

## 2014-08-20 DIAGNOSIS — N183 Chronic kidney disease, stage 3 unspecified: Secondary | ICD-10-CM | POA: Diagnosis present

## 2014-08-20 DIAGNOSIS — N186 End stage renal disease: Secondary | ICD-10-CM | POA: Diagnosis present

## 2014-08-20 DIAGNOSIS — Z9049 Acquired absence of other specified parts of digestive tract: Secondary | ICD-10-CM | POA: Diagnosis present

## 2014-08-20 DIAGNOSIS — J90 Pleural effusion, not elsewhere classified: Secondary | ICD-10-CM | POA: Diagnosis not present

## 2014-08-20 DIAGNOSIS — Z961 Presence of intraocular lens: Secondary | ICD-10-CM | POA: Diagnosis present

## 2014-08-20 DIAGNOSIS — K219 Gastro-esophageal reflux disease without esophagitis: Secondary | ICD-10-CM | POA: Diagnosis present

## 2014-08-20 DIAGNOSIS — R509 Fever, unspecified: Secondary | ICD-10-CM | POA: Diagnosis not present

## 2014-08-20 DIAGNOSIS — G4733 Obstructive sleep apnea (adult) (pediatric): Secondary | ICD-10-CM | POA: Diagnosis present

## 2014-08-20 DIAGNOSIS — N179 Acute kidney failure, unspecified: Secondary | ICD-10-CM | POA: Diagnosis present

## 2014-08-20 DIAGNOSIS — Y95 Nosocomial condition: Secondary | ICD-10-CM | POA: Diagnosis present

## 2014-08-20 DIAGNOSIS — I480 Paroxysmal atrial fibrillation: Secondary | ICD-10-CM | POA: Diagnosis present

## 2014-08-20 DIAGNOSIS — J449 Chronic obstructive pulmonary disease, unspecified: Secondary | ICD-10-CM | POA: Diagnosis not present

## 2014-08-20 DIAGNOSIS — J9601 Acute respiratory failure with hypoxia: Secondary | ICD-10-CM | POA: Diagnosis not present

## 2014-08-20 DIAGNOSIS — Z9841 Cataract extraction status, right eye: Secondary | ICD-10-CM

## 2014-08-20 DIAGNOSIS — J154 Pneumonia due to other streptococci: Secondary | ICD-10-CM | POA: Diagnosis present

## 2014-08-20 DIAGNOSIS — Z86718 Personal history of other venous thrombosis and embolism: Secondary | ICD-10-CM

## 2014-08-20 DIAGNOSIS — J309 Allergic rhinitis, unspecified: Secondary | ICD-10-CM | POA: Diagnosis present

## 2014-08-20 DIAGNOSIS — R61 Generalized hyperhidrosis: Secondary | ICD-10-CM | POA: Diagnosis not present

## 2014-08-20 DIAGNOSIS — E860 Dehydration: Secondary | ICD-10-CM | POA: Diagnosis not present

## 2014-08-20 DIAGNOSIS — R0600 Dyspnea, unspecified: Secondary | ICD-10-CM

## 2014-08-20 DIAGNOSIS — I10 Essential (primary) hypertension: Secondary | ICD-10-CM | POA: Diagnosis present

## 2014-08-20 DIAGNOSIS — Z9842 Cataract extraction status, left eye: Secondary | ICD-10-CM

## 2014-08-20 DIAGNOSIS — Z880 Allergy status to penicillin: Secondary | ICD-10-CM | POA: Diagnosis not present

## 2014-08-20 DIAGNOSIS — Z6823 Body mass index (BMI) 23.0-23.9, adult: Secondary | ICD-10-CM | POA: Diagnosis not present

## 2014-08-20 DIAGNOSIS — Z992 Dependence on renal dialysis: Secondary | ICD-10-CM | POA: Diagnosis not present

## 2014-08-20 DIAGNOSIS — M329 Systemic lupus erythematosus, unspecified: Secondary | ICD-10-CM | POA: Diagnosis present

## 2014-08-20 DIAGNOSIS — Z8553 Personal history of malignant neoplasm of renal pelvis: Secondary | ICD-10-CM

## 2014-08-20 DIAGNOSIS — Z7901 Long term (current) use of anticoagulants: Secondary | ICD-10-CM

## 2014-08-20 DIAGNOSIS — K5090 Crohn's disease, unspecified, without complications: Secondary | ICD-10-CM | POA: Diagnosis present

## 2014-08-20 DIAGNOSIS — Z933 Colostomy status: Secondary | ICD-10-CM

## 2014-08-20 DIAGNOSIS — D638 Anemia in other chronic diseases classified elsewhere: Secondary | ICD-10-CM | POA: Diagnosis present

## 2014-08-20 DIAGNOSIS — R05 Cough: Secondary | ICD-10-CM | POA: Diagnosis not present

## 2014-08-20 DIAGNOSIS — E039 Hypothyroidism, unspecified: Secondary | ICD-10-CM | POA: Diagnosis present

## 2014-08-20 DIAGNOSIS — D849 Immunodeficiency, unspecified: Secondary | ICD-10-CM | POA: Diagnosis present

## 2014-08-20 DIAGNOSIS — J13 Pneumonia due to Streptococcus pneumoniae: Secondary | ICD-10-CM

## 2014-08-20 DIAGNOSIS — D72829 Elevated white blood cell count, unspecified: Secondary | ICD-10-CM | POA: Diagnosis not present

## 2014-08-20 DIAGNOSIS — J189 Pneumonia, unspecified organism: Secondary | ICD-10-CM

## 2014-08-20 DIAGNOSIS — M1990 Unspecified osteoarthritis, unspecified site: Secondary | ICD-10-CM | POA: Diagnosis present

## 2014-08-20 DIAGNOSIS — E875 Hyperkalemia: Secondary | ICD-10-CM | POA: Diagnosis not present

## 2014-08-20 HISTORY — DX: Pneumonia, unspecified organism: J18.9

## 2014-08-20 LAB — URINALYSIS, ROUTINE W REFLEX MICROSCOPIC
Bilirubin Urine: NEGATIVE
GLUCOSE, UA: NEGATIVE mg/dL
HGB URINE DIPSTICK: NEGATIVE
Ketones, ur: NEGATIVE mg/dL
LEUKOCYTES UA: NEGATIVE
Nitrite: NEGATIVE
PH: 5 (ref 5.0–8.0)
Protein, ur: NEGATIVE mg/dL
SPECIFIC GRAVITY, URINE: 1.021 (ref 1.005–1.030)
Urobilinogen, UA: 0.2 mg/dL (ref 0.0–1.0)

## 2014-08-20 LAB — CBC WITH DIFFERENTIAL/PLATELET
BASOS ABS: 0 10*3/uL (ref 0.0–0.1)
Basophils Relative: 0 % (ref 0–1)
Eosinophils Absolute: 0.1 10*3/uL (ref 0.0–0.7)
Eosinophils Relative: 0 % (ref 0–5)
HCT: 33.1 % — ABNORMAL LOW (ref 36.0–46.0)
Hemoglobin: 10.1 g/dL — ABNORMAL LOW (ref 12.0–15.0)
LYMPHS ABS: 1.1 10*3/uL (ref 0.7–4.0)
LYMPHS PCT: 7 % — AB (ref 12–46)
MCH: 28.3 pg (ref 26.0–34.0)
MCHC: 30.5 g/dL (ref 30.0–36.0)
MCV: 92.7 fL (ref 78.0–100.0)
Monocytes Absolute: 1.6 10*3/uL — ABNORMAL HIGH (ref 0.1–1.0)
Monocytes Relative: 11 % (ref 3–12)
NEUTROS ABS: 12 10*3/uL — AB (ref 1.7–7.7)
NEUTROS PCT: 82 % — AB (ref 43–77)
PLATELETS: 117 10*3/uL — AB (ref 150–400)
RBC: 3.57 MIL/uL — AB (ref 3.87–5.11)
RDW: 15.5 % (ref 11.5–15.5)
WBC: 14.7 10*3/uL — AB (ref 4.0–10.5)

## 2014-08-20 LAB — BASIC METABOLIC PANEL
ANION GAP: 10 (ref 5–15)
BUN: 42 mg/dL — ABNORMAL HIGH (ref 6–23)
CHLORIDE: 103 mmol/L (ref 96–112)
CO2: 20 mmol/L (ref 19–32)
Calcium: 8.6 mg/dL (ref 8.4–10.5)
Creatinine, Ser: 2.4 mg/dL — ABNORMAL HIGH (ref 0.50–1.10)
GFR calc Af Amer: 22 mL/min — ABNORMAL LOW (ref >90)
GFR calc non Af Amer: 19 mL/min — ABNORMAL LOW (ref >90)
Glucose, Bld: 94 mg/dL (ref 70–99)
POTASSIUM: 4.6 mmol/L (ref 3.5–5.1)
SODIUM: 133 mmol/L — AB (ref 135–145)

## 2014-08-20 LAB — HEPATIC FUNCTION PANEL
ALT: 20 U/L (ref 0–35)
AST: 26 U/L (ref 0–37)
Albumin: 3.1 g/dL — ABNORMAL LOW (ref 3.5–5.2)
Alkaline Phosphatase: 94 U/L (ref 39–117)
BILIRUBIN TOTAL: 0.7 mg/dL (ref 0.3–1.2)
Bilirubin, Direct: 0.3 mg/dL (ref 0.0–0.5)
Indirect Bilirubin: 0.4 mg/dL (ref 0.3–0.9)
TOTAL PROTEIN: 6 g/dL (ref 6.0–8.3)

## 2014-08-20 LAB — I-STAT CHEM 8, ED
BUN: 44 mg/dL — ABNORMAL HIGH (ref 6–23)
Calcium, Ion: 1.18 mmol/L (ref 1.13–1.30)
Chloride: 104 mmol/L (ref 96–112)
Creatinine, Ser: 2.4 mg/dL — ABNORMAL HIGH (ref 0.50–1.10)
Glucose, Bld: 91 mg/dL (ref 70–99)
HEMATOCRIT: 37 % (ref 36.0–46.0)
HEMOGLOBIN: 12.6 g/dL (ref 12.0–15.0)
Potassium: 4.6 mmol/L (ref 3.5–5.1)
SODIUM: 135 mmol/L (ref 135–145)
TCO2: 19 mmol/L (ref 0–100)

## 2014-08-20 LAB — I-STAT TROPONIN, ED: Troponin i, poc: 0.04 ng/mL (ref 0.00–0.08)

## 2014-08-20 LAB — TECHNOLOGIST SMEAR REVIEW

## 2014-08-20 LAB — PROTIME-INR
INR: 3.34 — ABNORMAL HIGH (ref 0.00–1.49)
Prothrombin Time: 34.1 seconds — ABNORMAL HIGH (ref 11.6–15.2)

## 2014-08-20 LAB — MAGNESIUM: Magnesium: 1.4 mg/dL — ABNORMAL LOW (ref 1.5–2.5)

## 2014-08-20 LAB — I-STAT CG4 LACTIC ACID, ED
Lactic Acid, Venous: <0.3 mmol/L — ABNORMAL LOW (ref 0.5–2.0)
Lactic Acid, Venous: <0.3 mmol/L — ABNORMAL LOW (ref 0.5–2.0)

## 2014-08-20 LAB — PROCALCITONIN: Procalcitonin: 2.65 ng/mL

## 2014-08-20 LAB — MRSA PCR SCREENING: MRSA by PCR: NEGATIVE

## 2014-08-20 LAB — PHOSPHORUS: PHOSPHORUS: 3.1 mg/dL (ref 2.3–4.6)

## 2014-08-20 MED ORDER — DM-GUAIFENESIN ER 30-600 MG PO TB12
1.0000 | ORAL_TABLET | Freq: Two times a day (BID) | ORAL | Status: DC
  Administered 2014-08-20 – 2014-08-23 (×6): 1 via ORAL
  Filled 2014-08-20 (×7): qty 1

## 2014-08-20 MED ORDER — CEFTRIAXONE SODIUM IN DEXTROSE 20 MG/ML IV SOLN
1.0000 g | INTRAVENOUS | Status: DC
  Filled 2014-08-20: qty 50

## 2014-08-20 MED ORDER — LEVOTHYROXINE SODIUM 100 MCG PO TABS
100.0000 ug | ORAL_TABLET | Freq: Every day | ORAL | Status: DC
  Administered 2014-08-20 – 2014-08-26 (×6): 100 ug via ORAL
  Filled 2014-08-20 (×7): qty 1

## 2014-08-20 MED ORDER — METOPROLOL TARTRATE 100 MG PO TABS
100.0000 mg | ORAL_TABLET | Freq: Two times a day (BID) | ORAL | Status: DC
Start: 2014-08-20 — End: 2014-08-26
  Administered 2014-08-20 – 2014-08-26 (×12): 100 mg via ORAL
  Filled 2014-08-20 (×14): qty 1

## 2014-08-20 MED ORDER — GABAPENTIN 100 MG PO CAPS
200.0000 mg | ORAL_CAPSULE | Freq: Every day | ORAL | Status: DC
  Administered 2014-08-20 – 2014-08-25 (×6): 200 mg via ORAL
  Filled 2014-08-20 (×7): qty 2

## 2014-08-20 MED ORDER — ENSURE ENLIVE PO LIQD
237.0000 mL | Freq: Two times a day (BID) | ORAL | Status: DC
  Administered 2014-08-21 – 2014-08-26 (×9): 237 mL via ORAL

## 2014-08-20 MED ORDER — ACETAMINOPHEN 325 MG PO TABS: 650.0000 mg | ORAL_TABLET | Freq: Four times a day (QID) | ORAL | Status: DC | PRN

## 2014-08-20 MED ORDER — BENZONATATE 100 MG PO CAPS
100.0000 mg | ORAL_CAPSULE | Freq: Two times a day (BID) | ORAL | Status: DC | PRN
  Administered 2014-08-21 – 2014-08-24 (×3): 100 mg via ORAL
  Filled 2014-08-20 (×5): qty 1

## 2014-08-20 MED ORDER — HYDROCODONE-ACETAMINOPHEN 5-325 MG PO TABS
1.0000 | ORAL_TABLET | ORAL | Status: DC | PRN
  Administered 2014-08-20 – 2014-08-26 (×11): 1 via ORAL
  Filled 2014-08-20 (×11): qty 1

## 2014-08-20 MED ORDER — ONDANSETRON HCL 4 MG/2ML IJ SOLN
4.0000 mg | Freq: Once | INTRAMUSCULAR | Status: AC
  Administered 2014-08-20: 4 mg via INTRAVENOUS
  Filled 2014-08-20: qty 2

## 2014-08-20 MED ORDER — AMIODARONE HCL 100 MG PO TABS
100.0000 mg | ORAL_TABLET | Freq: Two times a day (BID) | ORAL | Status: DC
  Administered 2014-08-20 – 2014-08-26 (×12): 100 mg via ORAL
  Filled 2014-08-20 (×16): qty 1

## 2014-08-20 MED ORDER — PREDNISONE 10 MG PO TABS
10.0000 mg | ORAL_TABLET | Freq: Every day | ORAL | Status: DC
  Administered 2014-08-20 – 2014-08-26 (×7): 10 mg via ORAL
  Filled 2014-08-20 (×7): qty 1

## 2014-08-20 MED ORDER — IPRATROPIUM-ALBUTEROL 0.5-2.5 (3) MG/3ML IN SOLN
3.0000 mL | Freq: Three times a day (TID) | RESPIRATORY_TRACT | Status: DC
  Administered 2014-08-21 – 2014-08-22 (×5): 3 mL via RESPIRATORY_TRACT
  Filled 2014-08-20 (×5): qty 3

## 2014-08-20 MED ORDER — IPRATROPIUM-ALBUTEROL 0.5-2.5 (3) MG/3ML IN SOLN
3.0000 mL | RESPIRATORY_TRACT | Status: DC
  Administered 2014-08-20: 3 mL via RESPIRATORY_TRACT
  Filled 2014-08-20: qty 3

## 2014-08-20 MED ORDER — SODIUM CHLORIDE 0.9 % IV SOLN
INTRAVENOUS | Status: DC
  Administered 2014-08-20: 21:00:00 via INTRAVENOUS
  Administered 2014-08-21: 1000 mL via INTRAVENOUS
  Administered 2014-08-21 – 2014-08-23 (×4): via INTRAVENOUS
  Administered 2014-08-23 (×2): 1000 mL via INTRAVENOUS

## 2014-08-20 MED ORDER — IPRATROPIUM-ALBUTEROL 0.5-2.5 (3) MG/3ML IN SOLN
3.0000 mL | Freq: Once | RESPIRATORY_TRACT | Status: AC
  Administered 2014-08-20: 3 mL via RESPIRATORY_TRACT
  Filled 2014-08-20: qty 3

## 2014-08-20 MED ORDER — CALCITRIOL 0.25 MCG PO CAPS
0.2500 ug | ORAL_CAPSULE | ORAL | Status: DC
  Administered 2014-08-21 – 2014-08-25 (×3): 0.25 ug via ORAL
  Filled 2014-08-20 (×3): qty 1

## 2014-08-20 MED ORDER — CETYLPYRIDINIUM CHLORIDE 0.05 % MT LIQD
7.0000 mL | Freq: Two times a day (BID) | OROMUCOSAL | Status: DC
  Administered 2014-08-21 – 2014-08-26 (×11): 7 mL via OROMUCOSAL

## 2014-08-20 MED ORDER — SODIUM CHLORIDE 0.9 % IV BOLUS (SEPSIS)
1000.0000 mL | Freq: Once | INTRAVENOUS | Status: AC
  Administered 2014-08-20: 1000 mL via INTRAVENOUS

## 2014-08-20 MED ORDER — LORATADINE 10 MG PO TABS
10.0000 mg | ORAL_TABLET | Freq: Every day | ORAL | Status: DC
  Administered 2014-08-21 – 2014-08-26 (×6): 10 mg via ORAL
  Filled 2014-08-20 (×6): qty 1

## 2014-08-20 MED ORDER — MAGNESIUM SULFATE 2 GM/50ML IV SOLN
2.0000 g | Freq: Once | INTRAVENOUS | Status: AC
  Administered 2014-08-20: 2 g via INTRAVENOUS
  Filled 2014-08-20: qty 50

## 2014-08-20 MED ORDER — ONDANSETRON HCL 4 MG/2ML IJ SOLN
4.0000 mg | Freq: Four times a day (QID) | INTRAMUSCULAR | Status: DC | PRN
  Administered 2014-08-20 – 2014-08-23 (×2): 4 mg via INTRAVENOUS
  Filled 2014-08-20 (×2): qty 2

## 2014-08-20 MED ORDER — ACETAMINOPHEN 325 MG PO TABS
650.0000 mg | ORAL_TABLET | Freq: Four times a day (QID) | ORAL | Status: DC | PRN
  Administered 2014-08-20: 650 mg via ORAL
  Filled 2014-08-20: qty 2

## 2014-08-20 MED ORDER — ALBUTEROL SULFATE (2.5 MG/3ML) 0.083% IN NEBU: 5.0000 mg | INHALATION_SOLUTION | RESPIRATORY_TRACT | Status: DC | PRN

## 2014-08-20 MED ORDER — PREDNISONE 5 MG PO TABS
5.0000 mg | ORAL_TABLET | Freq: Every day | ORAL | Status: DC
  Filled 2014-08-20: qty 1

## 2014-08-20 MED ORDER — EPOETIN ALFA 40000 UNIT/ML IJ SOLN: 40000.0000 [IU] | INTRAMUSCULAR | Status: DC

## 2014-08-20 MED ORDER — TACROLIMUS 1 MG PO CAPS
5.0000 mg | ORAL_CAPSULE | Freq: Every morning | ORAL | Status: DC
  Administered 2014-08-21 – 2014-08-26 (×6): 5 mg via ORAL
  Filled 2014-08-20 (×7): qty 5

## 2014-08-20 MED ORDER — ACETAMINOPHEN 325 MG PO TABS
650.0000 mg | ORAL_TABLET | Freq: Once | ORAL | Status: AC
  Administered 2014-08-20: 650 mg via ORAL
  Filled 2014-08-20: qty 2

## 2014-08-20 MED ORDER — MYCOPHENOLATE SODIUM 180 MG PO TBEC
360.0000 mg | DELAYED_RELEASE_TABLET | Freq: Two times a day (BID) | ORAL | Status: DC
  Administered 2014-08-20 – 2014-08-26 (×12): 360 mg via ORAL
  Filled 2014-08-20 (×13): qty 2

## 2014-08-20 MED ORDER — DEXTROSE 5 % IV SOLN
1.0000 g | Freq: Once | INTRAVENOUS | Status: AC
  Administered 2014-08-20: 1 g via INTRAVENOUS
  Filled 2014-08-20: qty 10

## 2014-08-20 MED ORDER — TACROLIMUS 1 MG PO CAPS
4.0000 mg | ORAL_CAPSULE | Freq: Every evening | ORAL | Status: DC
Start: 2014-08-20 — End: 2014-08-26
  Administered 2014-08-20 – 2014-08-25 (×6): 4 mg via ORAL
  Filled 2014-08-20 (×8): qty 4

## 2014-08-20 MED ORDER — HEPARIN SODIUM (PORCINE) 5000 UNIT/ML IJ SOLN: 5000.0000 [IU] | Freq: Three times a day (TID) | INTRAMUSCULAR | Status: DC

## 2014-08-20 MED ORDER — PANTOPRAZOLE SODIUM 40 MG PO TBEC
40.0000 mg | DELAYED_RELEASE_TABLET | Freq: Every day | ORAL | Status: DC
  Administered 2014-08-21 – 2014-08-26 (×6): 40 mg via ORAL
  Filled 2014-08-20 (×4): qty 1

## 2014-08-20 NOTE — Progress Notes (Signed)
Patient arrived to nursing unit. Oriented to unit protocol and room. Made comfortable in bed and  MD Ahmednotified of patient presence.

## 2014-08-20 NOTE — ED Provider Notes (Signed)
Signed out by Dr Darl Householder at 1600 that patient has had workup ordered and antibiotics given for cap, and to call for admission once labs results.  Chemistry/cbc resulted.  Resident on call paged for unassigned admission - they request temp orders to med/surg bed, Dr Marinda Elk attending  (note no pcp, pt indicates only provider is [facility]).  Recheck pt ambulatory in hall, tolerating po fluids. bp normal.     Lajean Saver, MD 08/20/14 [phone]

## 2014-08-20 NOTE — ED Notes (Signed)
Attempted report 

## 2014-08-20 NOTE — Progress Notes (Signed)
ANTICOAGULATION CONSULT NOTE - Initial Consult  Pharmacy Consult for coumadin Indication: DVT  Allergies  Allergen Reactions  . Amoxicillin Anaphylaxis  . Ampicillin Anaphylaxis  . Ciprofloxacin Swelling    "of my throat"  . Erythromycin Anaphylaxis  . Penicillins Anaphylaxis  . Sulfa Antibiotics Itching  . Sulfamethoxazole-Trimethoprim Itching  . Tetracycline Anaphylaxis  . Labetalol Nausea And Vomiting    Patient Measurements: Height: 5' 3"  (160 cm) Weight: 133 lb 11.2 oz (60.646 kg) IBW/kg (Calculated) : 52.4  Vital Signs: Temp: 98.9 F (37.2 C) (04/13 1824) Temp Source: Oral (04/13 1824) BP: 142/53 mmHg (04/13 1824) Pulse Rate: 88 (04/13 1824)  Labs:  Recent Labs  08/20/14 1536 08/20/14 1549  HGB 10.1* 12.6  HCT 33.1* 37.0  PLT 117*  --   LABPROT 34.1*  --   INR 3.34*  --   CREATININE 2.40* 2.40*    Estimated Creatinine Clearance: 17.8 mL/min (by C-G formula based on Cr of 2.4).   Medical History: Past Medical History  Diagnosis Date  . Crohn's disease   . Hypertension   . Refusal of blood transfusion for reasons of conscience 12/20/2011    pt is Jehovah's Witness  . Atrial fibrillation   . DVT of leg (deep venous thrombosis) 2012-8./13/2013    "have had one in each leg now"  . Numbness and tingling of foot     bilaterally  . GERD (gastroesophageal reflux disease)   . Hypothyroidism   . Lower GI bleed 2012  . DVT of lower extremity, bilateral     "have had them in both legs; last one was on the RLE this year" (04/25/2012)  . OSA on CPAP   . Anemia     "when lupus flares up"  . SLE (systemic lupus erythematosus)   . Arthritis     "in my joints"  . Renal disorder     S/P nephrectomy; dialysis; "working fine now" (12/20/11)  . Renal cell carcinoma     Medications:  Prescriptions prior to admission  Medication Sig Dispense Refill Last Dose  . acetaminophen (TYLENOL) 325 MG tablet Take 650 mg by mouth every 6 (six) hours as needed for mild  pain.   08/20/2014 at Unknown time  . amiodarone (PACERONE) 200 MG tablet Take 100 mg by mouth 2 (two) times daily.    08/19/2014 at Unknown time  . calcitRIOL (ROCALTROL) 0.25 MCG capsule Take 0.25 mcg by mouth every other day.    08/19/2014 at Unknown time  . dextromethorphan-guaiFENesin (MUCINEX DM) 30-600 MG per 12 hr tablet Take 1 tablet by mouth 2 (two) times daily.   08/20/2014 at Unknown time  . epoetin alfa (EPOGEN,PROCRIT) 32951 UNIT/ML injection Inject 40,000 Units into the skin every 21 ( twenty-one) days. Due Thursday, 08/21/14   08/19/2014 at Unknown time  . gabapentin (NEURONTIN) 100 MG capsule Take 200 mg by mouth at bedtime.    08/19/2014 at Unknown time  . levothyroxine (SYNTHROID, LEVOTHROID) 100 MCG tablet Take 100 mcg by mouth daily.   08/19/2014 at Unknown time  . MAGNESIUM-OXIDE 400 (241.3 MG) MG tablet Take 400 mg by mouth 2 (two) times daily.  0 08/19/2014 at Unknown time  . metoprolol (LOPRESSOR) 100 MG tablet Take 100 mg by mouth 2 (two) times daily.   08/19/2014 at 1800  . mycophenolate (MYFORTIC) 180 MG EC tablet Take 360 mg by mouth 2 (two) times daily.     08/19/2014 at Unknown time  . omeprazole (PRILOSEC) 20 MG capsule Take 20 mg by  mouth daily.    08/19/2014 at Unknown time  . polyethylene glycol (MIRALAX / GLYCOLAX) packet Take 17 g by mouth 2 (two) times daily as needed. For constipation   Past Week at Unknown time  . polyvinyl alcohol-povidone (HYPOTEARS) 1.4-0.6 % ophthalmic solution Place 1-2 drops into both eyes 4 (four) times daily as needed (dry eyes).   Past Week at Unknown time  . predniSONE (DELTASONE) 5 MG tablet Take 5 mg by mouth daily.    08/19/2014 at Unknown time  . tacrolimus (PROGRAF) 1 MG capsule Take 4 mg by mouth every evening.    08/19/2014 at Unknown time  . tacrolimus (PROGRAF) 5 MG capsule Take 5 mg by mouth every morning.   08/19/2014 at Unknown time  . warfarin (COUMADIN) 5 MG tablet Take 1.25-5 mg by mouth every other day. Patient takes 1.78m every  other day, alternating with 2.5 mg   Past Week at Unknown time  . warfarin (COUMADIN) 2.5 MG tablet Take 1.25 mg by mouth every other day. Patient takes half of a 571mtablet every other day, alternating with half of a 2.5 mg tablet   Not Taking at Unknown time    Assessment: [age]o lady to continue coumadin for h/o DVT.  Her INR today is 3.34.  Her last dose of coumadin is unknown. Goal of Therapy:  INR 2-3 Monitor platelets by anticoagulation protocol: Yes   Plan:  No coumadin today F/u daily PT/INR Monitor for bleeding complications  Thanks for allowing pharmacy to be a part of this patient's care.  LoExcell SeltzerPharmD Clinical Pharmacist, [phone]/13/2016,6:51 PM

## 2014-08-20 NOTE — Progress Notes (Signed)
Notified MD oncall that pt's temp is 103.1. Will continue to monitor pt. Ranelle Oyster, Rn

## 2014-08-20 NOTE — ED Notes (Signed)
Pt states she has had fever, cold sweats and coughing up yellow and brown sputum for 2 days.  Pt states chest pain when coughing that radiates to her right side ribs and central back

## 2014-08-20 NOTE — ED Provider Notes (Signed)
CSN: [id number]     Arrival date & time 08/20/14  1340 History   First MD Initiated Contact with Patient 08/20/14 1341     Chief Complaint  Patient presents with  . Chest Pain     (Consider location/radiation/quality/duration/timing/severity/associated sxs/prior Treatment) The history is provided by the patient.  [name] is a 73 y.o. 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 Patient has been having fever and cough for the last 2 days. Coughing up some yellow and brownish sputum. Also has fever 101 this morning. Yesterday she felt weak and passed out in her bed at denies any head injury. Today she had another episode of syncope and felt lightheaded and dizzy. Has some chest pain only when she coughs. Of note, she is on prograf and coumadin. Suppose to get procrit tomorrow at oncology office. Denies melena.    Past Medical History  Diagnosis Date  . Crohn's disease   . Hypertension   . Refusal of blood transfusion for reasons of conscience 12/20/2011    pt is Jehovah's Witness  . Atrial fibrillation   . DVT of leg (deep venous thrombosis) 2012-8./13/2013    "have had one in each leg now"  . Numbness and tingling of foot     bilaterally  . GERD (gastroesophageal reflux disease)   . Hypothyroidism   . Lower GI bleed 2012  . DVT of lower extremity, bilateral     "have had them in both legs; last one was on the RLE this year" (04/25/2012)  . OSA on CPAP   . Anemia     "when lupus flares up"  . SLE (systemic lupus erythematosus)   . Arthritis     "in my joints"  . Renal disorder     S/P nephrectomy; dialysis; "working fine now" (12/20/11)  . Renal cell carcinoma    Past Surgical History  Procedure Laterality Date  . Cesarean section  1984  . Nephrectomy transplanted organ  2006    bilaterally  . Colectomy  1998    "removal of abscess" (04/25/2012)  . Excisional hemorrhoidectomy    . Cataract extraction  w/ intraocular lens  implant, bilateral  2005-2010  . Vena cava filter placement  2012  . Insertion of dialysis catheter  1988-2006    "peritoneal and hemodialysis; had multiple grafts and fistulas; last graft clotted off 2012"  . Cholecystectomy  1995  . Colostomy  1998  . Colostomy reversal  1999    "6 months after placed" (04/25/2012)  . Bone marrow biopsy  1993; 1995  . Abdominal exploration surgery  1995    "found sluggish bone marrow" (04/25/2012)  . Parathyroid implant removal Left     removed parathyroid from neck and implanted in arm  . Colonoscopy N/A 08/30/2013    Procedure: COLONOSCOPY;  Surgeon: Winfield Cunas., MD;  Location: [facility];  Service: Endoscopy;  Laterality: N/A;   Family History  Problem Relation Age of Onset  . Heart disease Mother   . Hypertension      runs in family  . Sleep apnea Sister    History  Substance Use Topics  . Smoking status: Never Smoker   . Smokeless tobacco: Never Used  . Alcohol Use: No   OB History    No data available     Review of Systems  Constitutional: Positive for fever and chills.  Respiratory: Positive for cough.   Cardiovascular:  Positive for chest pain.  All other systems reviewed and are negative.     Allergies  Amoxicillin; Ampicillin; Ciprofloxacin; Erythromycin; Penicillins; Sulfa antibiotics; Sulfamethoxazole-trimethoprim; Tetracycline; and Labetalol  Home Medications   Prior to Admission medications   Medication Sig Start Date End Date Taking? Authorizing Provider  acetaminophen (TYLENOL) 325 MG tablet Take 650 mg by mouth every 6 (six) hours as needed for mild pain.   Yes Historical Provider, MD  amiodarone (PACERONE) 200 MG tablet Take 100 mg by mouth 2 (two) times daily.    Yes Historical Provider, MD  calcitRIOL (ROCALTROL) 0.25 MCG capsule Take 0.25 mcg by mouth every other day.    Yes Historical Provider, MD  dextromethorphan-guaiFENesin (MUCINEX DM) 30-600 MG per 12 hr tablet Take 1 tablet  by mouth 2 (two) times daily.   Yes Historical Provider, MD  epoetin alfa (EPOGEN,PROCRIT) 92426 UNIT/ML injection Inject 40,000 Units into the skin every 21 ( twenty-one) days. Due Thursday, 08/21/14   Yes Historical Provider, MD  gabapentin (NEURONTIN) 100 MG capsule Take 200 mg by mouth at bedtime.    Yes Historical Provider, MD  levothyroxine (SYNTHROID, LEVOTHROID) 100 MCG tablet Take 100 mcg by mouth daily.   Yes Historical Provider, MD  MAGNESIUM-OXIDE 400 (241.3 MG) MG tablet Take 400 mg by mouth 2 (two) times daily. 05/19/14  Yes Historical Provider, MD  metoprolol (LOPRESSOR) 100 MG tablet Take 100 mg by mouth 2 (two) times daily.   Yes Historical Provider, MD  mycophenolate (MYFORTIC) 180 MG EC tablet Take 360 mg by mouth 2 (two) times daily.     Yes Historical Provider, MD  omeprazole (PRILOSEC) 20 MG capsule Take 20 mg by mouth daily.    Yes Historical Provider, MD  polyethylene glycol (MIRALAX / GLYCOLAX) packet Take 17 g by mouth 2 (two) times daily as needed. For constipation   Yes Historical Provider, MD  polyvinyl alcohol-povidone (HYPOTEARS) 1.4-0.6 % ophthalmic solution Place 1-2 drops into both eyes 4 (four) times daily as needed (dry eyes).   Yes Historical Provider, MD  predniSONE (DELTASONE) 5 MG tablet Take 5 mg by mouth daily.    Yes Historical Provider, MD  tacrolimus (PROGRAF) 1 MG capsule Take 4 mg by mouth every evening.    Yes Historical Provider, MD  tacrolimus (PROGRAF) 5 MG capsule Take 5 mg by mouth every morning.   Yes Historical Provider, MD  warfarin (COUMADIN) 5 MG tablet Take 1.25-5 mg by mouth every other day. Patient takes 1.17m every other day, alternating with 2.5 mg   Yes Historical Provider, MD  warfarin (COUMADIN) 2.5 MG tablet Take 1.25 mg by mouth every other day. Patient takes half of a 566mtablet every other day, alternating with half of a 2.5 mg tablet    Historical Provider, MD   BP 114/49 mmHg  Pulse 80  Temp(Src) 98.7 F (37.1 C) (Oral)  Resp  20  SpO2 97%  LMP 03/05/2014 Physical Exam  Constitutional: She is oriented to person, place, and time.  Uncomfortable, dehydrated   HENT:  Head: Normocephalic.  MM dry   Eyes: Conjunctivae are normal. Pupils are equal, round, and reactive to light.  Neck: Normal range of motion. Neck supple.  Cardiovascular: Regular rhythm and normal heart sounds.   Slightly tachy   Pulmonary/Chest: Effort normal.  Diminished breath sounds bilateral bases   Abdominal: Soft. Bowel sounds are normal. She exhibits no distension. There is no tenderness. There is no rebound and no guarding.  Musculoskeletal: Normal range of motion. She  exhibits no edema or tenderness.  Neurological: She is alert and oriented to person, place, and time. No cranial nerve deficit. Coordination normal.  Skin: Skin is warm and dry.  Psychiatric: She has a normal mood and affect. Her behavior is normal. Judgment and thought content normal.  Nursing note and vitals reviewed.   ED Course  Procedures (including critical care time) Labs Review Labs Reviewed  I-STAT CG4 LACTIC ACID, ED - Abnormal; Notable for the following:    Lactic Acid, Venous <0.30 (*)    All other components within normal limits  I-STAT CHEM 8, ED - Abnormal; Notable for the following:    BUN 44 (*)    Creatinine, Ser 2.40 (*)    All other components within normal limits  I-STAT CG4 LACTIC ACID, ED - Abnormal; Notable for the following:    Lactic Acid, Venous <0.30 (*)    All other components within normal limits  CULTURE, BLOOD (ROUTINE X 2)  CULTURE, BLOOD (ROUTINE X 2)  CBC WITH DIFFERENTIAL/PLATELET  URINALYSIS, ROUTINE W REFLEX MICROSCOPIC  PROTIME-INR  CBC WITH DIFFERENTIAL/PLATELET  BASIC METABOLIC PANEL  I-STAT TROPOININ, ED  I-STAT CHEM 8, ED    Imaging Review Dg Chest 2 View  08/20/2014   CLINICAL DATA:  Fever, cough, lower RIGHT chest pain during coughing for 2 days, history hypertension, DVT, Crohn's disease, lupus, renal cell  carcinoma  EXAM: CHEST  2 VIEW  COMPARISON:  05/29/2013  FINDINGS: Enlargement of cardiac silhouette.  Calcified tortuous aorta.  Pulmonary vascularity normal.  RIGHT lower lobe infiltrate consistent with pneumonia.  Mild central peribronchial thickening.  Minimal atelectasis at lung bases and scattered thickening of the fissures.  Upper lungs clear.  No definite pleural effusion or pneumothorax.  Numerous surgical clips and IVC filter noted in upper abdomen.  Bones demineralized.  IMPRESSION: RIGHT lower lobe pneumonia.  Enlargement of cardiac silhouette.  Bibasilar atelectasis.   Electronically Signed   By: Lavonia Dana M.D.   On: 08/20/2014 15:21     EKG Interpretation   Date/Time:  Wednesday August 20 2014 14:01:03 EDT Ventricular Rate:  81 PR Interval:  199 QRS Duration: 137 QT Interval:  408 QTC Calculation: 474 R Axis:   76 Text Interpretation:  Sinus rhythm Right bundle branch block No  significant change since last tracing Confirmed by YAO  MD, DAVID ([id number])  on 08/20/2014 2:10:24 PM      MDM   Final diagnoses:  None    [name] is a 74 y.o. female here with feer, cough. Likely viral syndrome vs flu vs pneumonia. Syncope likely due to dehydration vs sepsis. Will do sepsis workup. Will hydrate and reassess.   4:03 PM CXR showed pneumonia. Multiple allergies including amoxicillin, cipro, and erythromycin. Has had keflex in the past. Will try ceftriaxone. Labs pending. Still requiring oxygen. Signed out to Dr. Ashok Cordia in the ED to f/u labs and reassess.     Wandra Arthurs, MD 08/20/14 [phone]

## 2014-08-20 NOTE — ED Notes (Signed)
Pt requests to wait until her antibiotic is finished before going to the bathroom

## 2014-08-20 NOTE — H&P (Signed)
Date: 08/20/2014               Patient Name:  [name] MRN: [id number]  DOB: 04-11-1941 Age / Sex: 74 y.o., female   PCP: Mauricia Area, MD         Medical Service: Internal Medicine Teaching Service         Attending Physician: Dr. Bertha Stakes, MD    First Contact: Dr. Genene Churn Pager: [phone]  Second Contact: Dr. Naaman Plummer Pager: [phone]       After Hours (After 5p/  First Contact Pager: [phone]  weekends / holidays): Second Contact Pager: [phone]   Chief Complaint: fever, cough.  History of Present Illness:   74 yo female with hx of HTN, DVT coumadin, ESRD was on HD for SLE, but then had kidney tx on immunosuppression, comes in with fever, cough, malaise for 1-2 days. Reported fever 101 this morning, coughing up yellow sputum, has been feeling weak for 2 days. Has runny nose 1-2 days. Also having diarrhea, nausea, vomiting. Having rib pain from coughing severely. Also having some muscle ache. Did receive flut shot. No sick contacts. Denies any other problems. Has been following with Dr. Jimmy Footman for kidney transplant. No issues. Was doing well prior to last 2 days.   Follows with Dr. Percell [facility] for hx of crohn's. No recent flares. Not on meds for it. Has hx of allergic rxn to many abx.stated vanc and gent was ok in the past.  Meds: No current facility-administered medications for this encounter.   Medications Prior to Admission  Medication Sig Dispense Refill  . acetaminophen (TYLENOL) 325 MG tablet Take 650 mg by mouth every 6 (six) hours as needed for mild pain.    Marland Kitchen amiodarone (PACERONE) 200 MG tablet Take 100 mg by mouth 2 (two) times daily.     . calcitRIOL (ROCALTROL) 0.25 MCG capsule Take 0.25 mcg by mouth every other day.     Marland Kitchen dextromethorphan-guaiFENesin (MUCINEX DM) 30-600 MG per 12 hr tablet Take 1 tablet by mouth 2 (two) times daily.    Marland Kitchen epoetin alfa (EPOGEN,PROCRIT) 58850 UNIT/ML injection Inject 40,000 Units into the skin every 21 ( twenty-one) days. Due  Thursday, 08/21/14    . gabapentin (NEURONTIN) 100 MG capsule Take 200 mg by mouth at bedtime.     Marland Kitchen levothyroxine (SYNTHROID, LEVOTHROID) 100 MCG tablet Take 100 mcg by mouth daily.    Marland Kitchen MAGNESIUM-OXIDE 400 (241.3 MG) MG tablet Take 400 mg by mouth 2 (two) times daily.  0  . metoprolol (LOPRESSOR) 100 MG tablet Take 100 mg by mouth 2 (two) times daily.    . mycophenolate (MYFORTIC) 180 MG EC tablet Take 360 mg by mouth 2 (two) times daily.      Marland Kitchen omeprazole (PRILOSEC) 20 MG capsule Take 20 mg by mouth daily.     . polyethylene glycol (MIRALAX / GLYCOLAX) packet Take 17 g by mouth 2 (two) times daily as needed. For constipation    . polyvinyl alcohol-povidone (HYPOTEARS) 1.4-0.6 % ophthalmic solution Place 1-2 drops into both eyes 4 (four) times daily as needed (dry eyes).    . predniSONE (DELTASONE) 5 MG tablet Take 5 mg by mouth daily.     . tacrolimus (PROGRAF) 1 MG capsule Take 4 mg by mouth every evening.     . tacrolimus (PROGRAF) 5 MG capsule Take 5 mg by mouth every morning.    . warfarin (COUMADIN) 5 MG tablet Take 1.25-5 mg by mouth every other day. Patient  takes 1.43m every other day, alternating with 2.5 mg    . warfarin (COUMADIN) 2.5 MG tablet Take 1.25 mg by mouth every other day. Patient takes half of a 547mtablet every other day, alternating with half of a 2.5 mg tablet      Allergies: Allergies as of 08/20/2014 - Review Complete 08/20/2014  Allergen Reaction Noted  . Amoxicillin Anaphylaxis   . Ampicillin Anaphylaxis   . Ciprofloxacin Swelling   . Erythromycin Anaphylaxis   . Penicillins Anaphylaxis 07/28/2011  . Sulfa antibiotics Itching 12/20/2011  . Sulfamethoxazole-trimethoprim Itching   . Tetracycline Anaphylaxis   . Labetalol Nausea And Vomiting 07/28/2011   Past Medical History  Diagnosis Date  . Crohn's disease   . Hypertension   . Refusal of blood transfusion for reasons of conscience 12/20/2011    pt is [employer]'s [employer]  . Atrial fibrillation   . DVT of  leg (deep venous thrombosis) 2012-8./13/2013    "have had one in each leg now"  . Numbness and tingling of foot     bilaterally  . GERD (gastroesophageal reflux disease)   . Hypothyroidism   . Lower GI bleed 2012  . DVT of lower extremity, bilateral     "have had them in both legs; last one was on the RLE this year" (04/25/2012)  . OSA on CPAP   . Anemia     "when lupus flares up"  . SLE (systemic lupus erythematosus)   . Arthritis     "in my joints"  . Renal disorder     S/P nephrectomy; dialysis; "working fine now" (12/20/11)  . Renal cell carcinoma    Past Surgical History  Procedure Laterality Date  . Cesarean section  1984  . Nephrectomy transplanted organ  2006    bilaterally  . Colectomy  1998    "removal of abscess" (04/25/2012)  . Excisional hemorrhoidectomy    . Cataract extraction w/ intraocular lens  implant, bilateral  2005-2010  . Vena cava filter placement  2012  . Insertion of dialysis catheter  1988-2006    "peritoneal and hemodialysis; had multiple grafts and fistulas; last graft clotted off 2012"  . Cholecystectomy  1995  . Colostomy  1998  . Colostomy reversal  1999    "6 months after placed" (04/25/2012)  . Bone marrow biopsy  1993; 1995  . Abdominal exploration surgery  1995    "found sluggish bone marrow" (04/25/2012)  . Parathyroid implant removal Left     removed parathyroid from neck and implanted in arm  . Colonoscopy N/A 08/30/2013    Procedure: COLONOSCOPY;  Surgeon: JaWinfield Cunas MD;  [facility];  Service: Endoscopy;  Laterality: N/A;   Family History  Problem Relation Age of Onset  . Heart disease Mother   . Hypertension      runs in family  . Sleep apnea Sister    History   Social History  . Marital Status: Married    Spouse Name: N/A  . Number of Children: 3  . Years of Education: N/A   Occupational History  . retired-bank teller    Social History Main Topics  . Smoking status: Never Smoker   . Smokeless  tobacco: Never Used  . Alcohol Use: No  . Drug Use: No  . Sexual Activity: Yes    Birth Control/ Protection: Post-menopausal   Other Topics Concern  . Not on file   Social History Narrative    Review of Systems: Review of Systems -   +  generalized malaise, n/v/diarrhea. Has chest tightness from coughing. Malaise.   - no bleeding, dysuria, chest pain, headache, vision changes, weakness focally, numbness, tingling.   Physical Exam: Blood pressure 116/47, pulse 93, temperature 98.6 F (37 C), temperature source Oral, resp. rate 18, last menstrual period 03/05/2014, SpO2 99 %. General: lying comfortable, NAD HEENT: perrla, eomi, [place]/at, normal nose and throat, no LAD Lungs: crackles on RLL, clear otherwise. Breathing normally. No chest TTP. Heart: RRR, no m/r/g Abdomen: soft, nt, nd, good bs. Msk: no edema b/l.  Neuro: a&ox4. Follows commands, no deficits.  Lab results: Basic Metabolic Panel:  Recent Labs  08/20/14 1536 08/20/14 1549  NA 133* 135  K 4.6 4.6  CL 103 104  CO2 20  --   GLUCOSE 94 91  BUN 42* 44*  CREATININE 2.40* 2.40*  CALCIUM 8.6  --    Liver Function Tests: No results for input(s): AST, ALT, ALKPHOS, BILITOT, PROT, ALBUMIN in the last 72 hours. No results for input(s): LIPASE, AMYLASE in the last 72 hours. No results for input(s): AMMONIA in the last 72 hours. CBC:  Recent Labs  08/20/14 1536 08/20/14 1549  WBC 14.7*  --   NEUTROABS 12.0*  --   HGB 10.1* 12.6  HCT 33.1* 37.0  MCV 92.7  --   PLT 117*  --    Coagulation:  Recent Labs  08/20/14 1536  LABPROT 34.1*  INR 3.34*   Urine Drug Screen: Drugs of Abuse  No results found for: LABOPIA, COCAINSCRNUR, LABBENZ, AMPHETMU, THCU, LABBARB  Alcohol Level: No results for input(s): ETH in the last 72 hours. Urinalysis:  Recent Labs  08/20/14 1650  COLORURINE YELLOW  LABSPEC 1.021  PHURINE 5.0  GLUCOSEU NEGATIVE  HGBUR NEGATIVE  BILIRUBINUR NEGATIVE  KETONESUR NEGATIVE    PROTEINUR NEGATIVE  UROBILINOGEN 0.2  NITRITE NEGATIVE  LEUKOCYTESUR NEGATIVE   Misc. Labs:  Imaging results:  Dg Chest 2 View  08/20/2014   CLINICAL DATA:  Fever, cough, lower RIGHT chest pain during coughing for 2 days, history hypertension, DVT, Crohn's disease, lupus, renal cell carcinoma  EXAM: CHEST  2 VIEW  COMPARISON:  05/29/2013  FINDINGS: Enlargement of cardiac silhouette.  Calcified tortuous aorta.  Pulmonary vascularity normal.  RIGHT lower lobe infiltrate consistent with pneumonia.  Mild central peribronchial thickening.  Minimal atelectasis at lung bases and scattered thickening of the fissures.  Upper lungs clear.  No definite pleural effusion or pneumothorax.  Numerous surgical clips and IVC filter noted in upper abdomen.  Bones demineralized.  IMPRESSION: RIGHT lower lobe pneumonia.  Enlargement of cardiac silhouette.  Bibasilar atelectasis.   Electronically Signed   By: Lavonia Dana M.D.   On: 08/20/2014 15:21    Other results: EKG:  Normal. No changes.   Echo 2/14 [facility] mild to moderate AR normal EF  Echo 09/2013: LVH, mild AR and MR. Normal EF.  Assessment & Plan by Problem: Active Problems:   CAP (community acquired pneumonia)  74 yo female with kidney transplant on immunosuppresion, here with CAP.  CAP - leukocytosis (more than usual - on prednisone), fever, cough for 2 days and CXR showing RLL infiltrate.  - no recent hospitalization. Has allergies to many abx so option is very limited. Given has infiltrate, would just treat with Ceftriaxone and not do atypical coverage. - will check strep pneum, legionella - tylenol for fever - sputum cx, blood cx - O2 to keep sat >92%.  Longstanding PAF and hx of DVT - on chronic coumadin - cont  amio 25m and metoprolol. Echo 2/14 [facility] mild to moderate AR normal EF   HTN - cont metoprolol.  CKD IV - bl gfr around 20.   was on HD in the past, s/p kidney transplant - crt and gfr around baseline now. f/up - cont  prograf 5 mg morning, 4 mg evening. - cont prednisone 135mdaily (5 mg normally, for recent illness) - cont mycophenolate.  Chronic normocytic anemia -- of chronic disease. b/l hgb around 10.  - stable. Monitor. Gets Epo injections every 21 days. Due 08/21/14.   hypomag - cont mag ox Hypothyroidism - cont levothyroxine GERD - cont ppi.  Dispo: Disposition is deferred at this time, awaiting improvement of current medical problems. Anticipated discharge in approximately 1-2 day(s).   The patient does have a current PCP (JMauricia AreaMD) and [facility].  The patient does have transportation limitations that hinder transportation to clinic appointments.  Signed: TaDellia NimsMD 08/20/2014, 6:20 PM

## 2014-08-20 NOTE — Progress Notes (Signed)
Pt placed on CPAP via nasal mask with 2 LPM O2 bleed in.  Current settings are 10 CMH20 per Pt home settings.  RT to monitor and assess as needed.

## 2014-08-21 ENCOUNTER — Ambulatory Visit: Payer: Medicare Other | Admitting: Internal Medicine

## 2014-08-21 ENCOUNTER — Encounter (HOSPITAL_COMMUNITY): Payer: BLUE CROSS/BLUE SHIELD

## 2014-08-21 DIAGNOSIS — K219 Gastro-esophageal reflux disease without esophagitis: Secondary | ICD-10-CM

## 2014-08-21 DIAGNOSIS — I4891 Unspecified atrial fibrillation: Secondary | ICD-10-CM

## 2014-08-21 DIAGNOSIS — N184 Chronic kidney disease, stage 4 (severe): Secondary | ICD-10-CM

## 2014-08-21 DIAGNOSIS — G4733 Obstructive sleep apnea (adult) (pediatric): Secondary | ICD-10-CM

## 2014-08-21 DIAGNOSIS — R55 Syncope and collapse: Secondary | ICD-10-CM

## 2014-08-21 DIAGNOSIS — J189 Pneumonia, unspecified organism: Secondary | ICD-10-CM

## 2014-08-21 DIAGNOSIS — K5090 Crohn's disease, unspecified, without complications: Secondary | ICD-10-CM

## 2014-08-21 DIAGNOSIS — E039 Hypothyroidism, unspecified: Secondary | ICD-10-CM

## 2014-08-21 DIAGNOSIS — J13 Pneumonia due to Streptococcus pneumoniae: Secondary | ICD-10-CM

## 2014-08-21 DIAGNOSIS — Z940 Kidney transplant status: Secondary | ICD-10-CM

## 2014-08-21 DIAGNOSIS — I129 Hypertensive chronic kidney disease with stage 1 through stage 4 chronic kidney disease, or unspecified chronic kidney disease: Secondary | ICD-10-CM

## 2014-08-21 DIAGNOSIS — D72829 Elevated white blood cell count, unspecified: Secondary | ICD-10-CM

## 2014-08-21 DIAGNOSIS — D631 Anemia in chronic kidney disease: Secondary | ICD-10-CM

## 2014-08-21 DIAGNOSIS — M329 Systemic lupus erythematosus, unspecified: Secondary | ICD-10-CM

## 2014-08-21 DIAGNOSIS — A419 Sepsis, unspecified organism: Principal | ICD-10-CM

## 2014-08-21 LAB — CBC WITH DIFFERENTIAL/PLATELET
BASOS ABS: 0 10*3/uL (ref 0.0–0.1)
BASOS PCT: 0 % (ref 0–1)
Eosinophils Absolute: 0 10*3/uL (ref 0.0–0.7)
Eosinophils Relative: 0 % (ref 0–5)
HEMATOCRIT: 36.2 % (ref 36.0–46.0)
HEMOGLOBIN: 11.2 g/dL — AB (ref 12.0–15.0)
LYMPHS ABS: 0.8 10*3/uL (ref 0.7–4.0)
Lymphocytes Relative: 4 % — ABNORMAL LOW (ref 12–46)
MCH: 28.9 pg (ref 26.0–34.0)
MCHC: 30.9 g/dL (ref 30.0–36.0)
MCV: 93.5 fL (ref 78.0–100.0)
MONO ABS: 1.3 10*3/uL — AB (ref 0.1–1.0)
MONOS PCT: 7 % (ref 3–12)
Neutro Abs: 17.2 10*3/uL — ABNORMAL HIGH (ref 1.7–7.7)
Neutrophils Relative %: 89 % — ABNORMAL HIGH (ref 43–77)
Platelets: 100 10*3/uL — ABNORMAL LOW (ref 150–400)
RBC: 3.87 MIL/uL (ref 3.87–5.11)
RDW: 15.9 % — ABNORMAL HIGH (ref 11.5–15.5)
WBC: 19.4 10*3/uL — ABNORMAL HIGH (ref 4.0–10.5)

## 2014-08-21 LAB — PROTIME-INR
INR: 3.7 — ABNORMAL HIGH (ref 0.00–1.49)
Prothrombin Time: 37 seconds — ABNORMAL HIGH (ref 11.6–15.2)

## 2014-08-21 LAB — BASIC METABOLIC PANEL
ANION GAP: 14 (ref 5–15)
Anion gap: 9 (ref 5–15)
BUN: 41 mg/dL — ABNORMAL HIGH (ref 6–23)
BUN: 45 mg/dL — ABNORMAL HIGH (ref 6–23)
CHLORIDE: 104 mmol/L (ref 96–112)
CHLORIDE: 105 mmol/L (ref 96–112)
CO2: 17 mmol/L — AB (ref 19–32)
CO2: 19 mmol/L (ref 19–32)
Calcium: 8.9 mg/dL (ref 8.4–10.5)
Calcium: 8.4 mg/dL (ref 8.4–10.5)
Creatinine, Ser: 2.67 mg/dL — ABNORMAL HIGH (ref 0.50–1.10)
Creatinine, Ser: 2.78 mg/dL — ABNORMAL HIGH (ref 0.50–1.10)
GFR calc Af Amer: 20 mL/min — ABNORMAL LOW (ref >90)
GFR calc Af Amer: 19 mL/min — ABNORMAL LOW (ref >90)
GFR calc non Af Amer: 17 mL/min — ABNORMAL LOW (ref >90)
GFR, EST NON AFRICAN AMERICAN: 16 mL/min — AB (ref >90)
Glucose, Bld: 83 mg/dL (ref 70–99)
Glucose, Bld: 137 mg/dL — ABNORMAL HIGH (ref 70–99)
POTASSIUM: 5.2 mmol/L — AB (ref 3.5–5.1)
Potassium: 5.5 mmol/L — ABNORMAL HIGH (ref 3.5–5.1)
SODIUM: 133 mmol/L — AB (ref 135–145)
Sodium: 135 mmol/L (ref 135–145)

## 2014-08-21 LAB — EXPECTORATED SPUTUM ASSESSMENT W REFEX TO RESP CULTURE

## 2014-08-21 LAB — HIV ANTIBODY (ROUTINE TESTING W REFLEX): HIV Screen 4th Generation wRfx: NONREACTIVE

## 2014-08-21 LAB — INFLUENZA PANEL BY PCR (TYPE A & B)
H1N1 flu by pcr: NOT DETECTED
INFLAPCR: NEGATIVE
INFLBPCR: NEGATIVE

## 2014-08-21 LAB — MAGNESIUM: Magnesium: 2.1 mg/dL (ref 1.5–2.5)

## 2014-08-21 LAB — STREP PNEUMONIAE URINARY ANTIGEN: STREP PNEUMO URINARY ANTIGEN: POSITIVE — AB

## 2014-08-21 LAB — EXPECTORATED SPUTUM ASSESSMENT W GRAM STAIN, RFLX TO RESP C

## 2014-08-21 MED ORDER — FLUTICASONE PROPIONATE 50 MCG/ACT NA SUSP
2.0000 | Freq: Every day | NASAL | Status: DC
  Administered 2014-08-22 – 2014-08-26 (×5): 2 via NASAL
  Filled 2014-08-21: qty 16

## 2014-08-21 MED ORDER — CEFTRIAXONE SODIUM IN DEXTROSE 40 MG/ML IV SOLN
2.0000 g | INTRAVENOUS | Status: DC
  Administered 2014-08-21 – 2014-08-25 (×5): 2 g via INTRAVENOUS
  Filled 2014-08-21 (×6): qty 50

## 2014-08-21 MED ORDER — SODIUM POLYSTYRENE SULFONATE 15 GM/60ML PO SUSP
15.0000 g | Freq: Once | ORAL | Status: AC
  Administered 2014-08-21: 15 g via ORAL
  Filled 2014-08-21: qty 60

## 2014-08-21 MED ORDER — WARFARIN - PHARMACIST DOSING INPATIENT
Freq: Every day | Status: DC
  Administered 2014-08-25: 18:00:00

## 2014-08-21 NOTE — Consult Note (Signed)
  [facility]    Date of Admission:  08/20/2014  Date of Consult:  08/21/2014  Reason for Consult: pneumonia, and sepsis in a renal transplant patient Referring Physician: Dr. Joines   HPI: [name] is an 74 y.o. female past medical history significant for Crohn's disease DVT renal transplantation with chronic kidney disease on immunosuppressive's who presented to the hospital after a syncopal event with several day history of cough malaise and fever.   She was admitted to the teaching service , blood cultures were obtained urine pneumococcal antigen was obtained which has been socially found to be positive. She has been placed on ceftriaxone and seen to be improving clinically though she is still having some fevers.  Today she says she is having more productive cough and "getting all that nasty stuff out"   Past Medical History  Diagnosis Date  . Crohn's disease   . Hypertension   . Refusal of blood transfusion for reasons of conscience 12/20/2011    pt is Je[employer]'s Witness  . Atrial fibrillation   . DVT of leg (deep venous thrombosis) 2012-8./13/2013    "have had one in each leg now"  . Numbness and tingling of foot     bilaterally  . GERD (gastroesophageal reflux disease)   . Hypothyroidism   . Lower GI bleed 2012  . DVT of lower extremity, bilateral     "have had them in both legs; last one was on the RLE this year" (04/25/2012)  . OSA on CPAP   . Anemia     "when lupus flares up"  . SLE (systemic lupus erythematosus)   . Arthritis     "in my joints"  . Renal disorder     S/P nephrectomy; dialysis; "working fine now" (12/20/11)  . Renal cell carcinoma   . CAP (community acquired pneumonia) 08/20/2014    Past Surgical History  Procedure Laterality Date  . Cesarean section  1984  . Nephrectomy transplanted organ  2006    bilaterally  . Colectomy  1998    "removal of abscess" (04/25/2012)  . Excisional hemorrhoidectomy    .  Cataract extraction w/ intraocular lens  implant, bilateral  2005-2010  . Vena cava filter placement  2012  . Insertion of dialysis catheter  1988-2006    "peritoneal and hemodialysis; had multiple grafts and fistulas; last graft clotted off 2012"  . Cholecystectomy  1995  . Colostomy  1998  . Colostomy reversal  1999    "6 months after placed" (04/25/2012)  . Bone marrow biopsy  1993; 1995  . Abdominal exploration surgery  1995    "found sluggish bone marrow" (04/25/2012)  . Parathyroid implant removal Left     removed parathyroid from neck and implanted in arm  . Colonoscopy N/A 08/30/2013    Procedure: COLONOSCOPY;  Surgeon: [name] Edwards Jr., MD;  Location: [facility];  Service: Endoscopy;  Laterality: N/A;  ergies:   Allergies  Allergen Reactions  . Amoxicillin Anaphylaxis  . Ampicillin Anaphylaxis  . Ciprofloxacin Swelling    "of my throat"  . Erythromycin Anaphylaxis  . Penicillins Anaphylaxis  . Sulfa Antibiotics Itching  . Sulfamethoxazole-Trimethoprim Itching  . Tetracycline Anaphylaxis  . Labetalol Nausea And Vomiting     Medications: I have reviewed patients current medications as documented in Epic Anti-infectives    Start     Dose/Rate Route Frequency Ordered Stop   08/21/14 1800  cefTRIAXone (ROCEPHIN) 1 g in dextrose 5 % 50 mL   IVPB - Premix     1 g 100 mL/hr over 30 Minutes Intravenous Every 24 hours 08/20/14 1838     08/20/14 1545  cefTRIAXone (ROCEPHIN) 1 g in dextrose 5 % 50 mL IVPB     1 g 100 mL/hr over 30 Minutes Intravenous  Once 08/20/14 1532 08/20/14 1630      Social History:  reports that she has never smoked. She has never used smokeless tobacco. She reports that she does not drink alcohol or use illicit drugs.  Family History  Problem Relation Age of Onset  . Heart disease Mother   . Hypertension      runs in family  . Sleep apnea Sister     As in HPI and primary teams notes otherwise 12 point review of systems is negative  Blood  pressure 108/56, pulse 72, temperature 98 F (36.7 C), temperature source Oral, resp. rate 20, height 5' 3" (1.6 m), weight 129 lb 3 oz (58.6 kg), last menstrual period 03/05/2014, SpO2 96 %. General: Alert and awake, oriented x3, not in any acute distress. HEENT: anicteric sclera, pupils reactive to light and accommodation, EOMI, oropharynx clear and without exudate CVS regular rate, normal r,  no murmur rubs or gallops Chest: Diminished breath sounds in the right base with some rhonchii Abdomen: soft nontender, nondistended, normal bowel sounds, Extremities: no  clubbing or edema noted bilaterally Skin: no rashes Neuro: nonfocal, strength and sensation intact   Results for orders placed or performed during the hospital encounter of 08/20/14 (from the past 48 hour(s))  Blood culture (routine x 2)     Status: None (Preliminary result)   Collection Time: 08/20/14  2:34 PM  Result Value Ref Range   Specimen Description BLOOD RIGHT HAND    Special Requests BOTTLES DRAWN AEROBIC ONLY 2CC    Culture             BLOOD CULTURE RECEIVED NO GROWTH TO DATE CULTURE WILL BE HELD FOR 5 DAYS BEFORE ISSUING A FINAL NEGATIVE REPORT Performed at [facility]    Report Status PENDING   I-stat troponin, ED     Status: None   Collection Time: 08/20/14  2:40 PM  Result Value Ref Range   Troponin i, poc 0.04 0.00 - 0.08 ng/mL   Comment 3            Comment: Due to the release kinetics of cTnI, a negative result within the first hours of the onset of symptoms does not rule out myocardial infarction with certainty. If myocardial infarction is still suspected, repeat the test at appropriate intervals.   I-Stat CG4 Lactic Acid, ED     Status: Abnormal   Collection Time: 08/20/14  2:42 PM  Result Value Ref Range   Lactic Acid, Venous <0.30 (L) 0.5 - 2.0 mmol/L  I-Stat CG4 Lactic Acid, ED     Status: Abnormal   Collection Time: 08/20/14  2:48 PM  Result Value Ref Range   Lactic Acid, Venous  <0.30 (L) 0.5 - 2.0 mmol/L  Protime-INR     Status: Abnormal   Collection Time: 08/20/14  3:36 PM  Result Value Ref Range   Prothrombin Time 34.1 (H) 11.6 - 15.2 seconds   INR 3.34 (H) 0.00 - 1.49  CBC with Differential     Status: Abnormal   Collection Time: 08/20/14  3:36 PM  Result Value Ref Range   WBC 14.7 (H) 4.0 - 10.5 K/uL   RBC 3.57 (L) 3.87 - 5.11 MIL/uL     Hemoglobin 10.1 (L) 12.0 - 15.0 g/dL   HCT 33.1 (L) 36.0 - 46.0 %   MCV 92.7 78.0 - 100.0 fL   MCH 28.3 26.0 - 34.0 pg   MCHC 30.5 30.0 - 36.0 g/dL   RDW 15.5 11.5 - 15.5 %   Platelets 117 (L) 150 - 400 K/uL    Comment: REPEATED TO VERIFY SPECIMEN CHECKED FOR CLOTS PLATELET COUNT CONFIRMED BY SMEAR    Neutrophils Relative % 82 (H) 43 - 77 %   Neutro Abs 12.0 (H) 1.7 - 7.7 K/uL   Lymphocytes Relative 7 (L) 12 - 46 %   Lymphs Abs 1.1 0.7 - 4.0 K/uL   Monocytes Relative 11 3 - 12 %   Monocytes Absolute 1.6 (H) 0.1 - 1.0 K/uL   Eosinophils Relative 0 0 - 5 %   Eosinophils Absolute 0.1 0.0 - 0.7 K/uL   Basophils Relative 0 0 - 1 %   Basophils Absolute 0.0 0.0 - 0.1 K/uL  Basic metabolic panel     Status: Abnormal   Collection Time: 08/20/14  3:36 PM  Result Value Ref Range   Sodium 133 (L) 135 - 145 mmol/L   Potassium 4.6 3.5 - 5.1 mmol/L   Chloride 103 96 - 112 mmol/L   CO2 20 19 - 32 mmol/L   Glucose, Bld 94 70 - 99 mg/dL   BUN 42 (H) 6 - 23 mg/dL   Creatinine, Ser 2.40 (H) 0.50 - 1.10 mg/dL   Calcium 8.6 8.4 - 10.5 mg/dL   GFR calc non Af Amer 19 (L) >90 mL/min   GFR calc Af Amer 22 (L) >90 mL/min    Comment: (NOTE) The eGFR has been calculated using the CKD EPI equation. This calculation has not been validated in all clinical situations. eGFR's persistently <90 mL/min signify possible Chronic Kidney Disease.    Anion gap 10 5 - 15  I-stat Chem 8, ED     Status: Abnormal   Collection Time: 08/20/14  3:49 PM  Result Value Ref Range   Sodium 135 135 - 145 mmol/L   Potassium 4.6 3.5 - 5.1 mmol/L    Chloride 104 96 - 112 mmol/L   BUN 44 (H) 6 - 23 mg/dL   Creatinine, Ser 2.40 (H) 0.50 - 1.10 mg/dL   Glucose, Bld 91 70 - 99 mg/dL   Calcium, Ion 1.18 1.13 - 1.30 mmol/L   TCO2 19 0 - 100 mmol/L   Hemoglobin 12.6 12.0 - 15.0 g/dL   HCT 37.0 36.0 - 46.0 %  Urinalysis, Routine w reflex microscopic     Status: None   Collection Time: 08/20/14  4:50 PM  Result Value Ref Range   Color, Urine YELLOW YELLOW   APPearance CLEAR CLEAR   Specific Gravity, Urine 1.021 1.005 - 1.030   pH 5.0 5.0 - 8.0   Glucose, UA NEGATIVE NEGATIVE mg/dL   Hgb urine dipstick NEGATIVE NEGATIVE   Bilirubin Urine NEGATIVE NEGATIVE   Ketones, ur NEGATIVE NEGATIVE mg/dL   Protein, ur NEGATIVE NEGATIVE mg/dL   Urobilinogen, UA 0.2 0.0 - 1.0 mg/dL   Nitrite NEGATIVE NEGATIVE   Leukocytes, UA NEGATIVE NEGATIVE    Comment: MICROSCOPIC NOT DONE ON URINES WITH NEGATIVE PROTEIN, BLOOD, LEUKOCYTES, NITRITE, OR GLUCOSE <1000 mg/dL.  Influenza panel by PCR (type A & B, H1N1)     Status: None   Collection Time: 08/20/14  8:45 PM  Result Value Ref Range   Influenza A By PCR NEGATIVE NEGATIVE   Influenza   B By PCR NEGATIVE NEGATIVE   H1N1 flu by pcr NOT DETECTED NOT DETECTED    Comment:        The Xpert Flu assay (FDA approved for nasal aspirates or washes and nasopharyngeal swab specimens), is intended as an aid in the diagnosis of influenza and should not be used as a sole basis for treatment.   MRSA PCR Screening     Status: None   Collection Time: 08/20/14  8:45 PM  Result Value Ref Range   MRSA by PCR NEGATIVE NEGATIVE    Comment:        The GeneXpert MRSA Assay (FDA approved for NASAL specimens only), is one component of a comprehensive MRSA colonization surveillance program. It is not intended to diagnose MRSA infection nor to guide or monitor treatment for MRSA infections.   Hepatic function panel     Status: Abnormal   Collection Time: 08/20/14  8:51 PM  Result Value Ref Range   Total Protein 6.0  6.0 - 8.3 g/dL   Albumin 3.1 (L) 3.5 - 5.2 g/dL   AST 26 0 - 37 U/L   ALT 20 0 - 35 U/L   Alkaline Phosphatase 94 39 - 117 U/L   Total Bilirubin 0.7 0.3 - 1.2 mg/dL   Bilirubin, Direct 0.3 0.0 - 0.5 mg/dL   Indirect Bilirubin 0.4 0.3 - 0.9 mg/dL  Magnesium     Status: Abnormal   Collection Time: 08/20/14  8:51 PM  Result Value Ref Range   Magnesium 1.4 (L) 1.5 - 2.5 mg/dL  Phosphorus     Status: None   Collection Time: 08/20/14  8:51 PM  Result Value Ref Range   Phosphorus 3.1 2.3 - 4.6 mg/dL  Procalcitonin     Status: None   Collection Time: 08/20/14  8:51 PM  Result Value Ref Range   Procalcitonin 2.65 ng/mL    Comment:        Interpretation: PCT > 2 ng/mL: Systemic infection (sepsis) is likely, unless other causes are known. (NOTE)         ICU PCT Algorithm               Non ICU PCT Algorithm    ----------------------------     ------------------------------         PCT < 0.25 ng/mL                 PCT < 0.1 ng/mL     Stopping of antibiotics            Stopping of antibiotics       strongly encouraged.               strongly encouraged.    ----------------------------     ------------------------------       PCT level decrease by               PCT < 0.25 ng/mL       >= 80% from peak PCT       OR PCT 0.25 - 0.5 ng/mL          Stopping of antibiotics                                             encouraged.     Stopping of antibiotics           encouraged.    ----------------------------     ------------------------------         PCT level decrease by              PCT >= 0.25 ng/mL       < 80% from peak PCT        AND PCT >= 0.5 ng/mL            Continuing antibiotics                                               encouraged.       Continuing antibiotics            encouraged.    ----------------------------     ------------------------------     PCT level increase compared          PCT > 0.5 ng/mL         with peak PCT AND          PCT >= 0.5 ng/mL             Escalation  of antibiotics                                          strongly encouraged.      Escalation of antibiotics        strongly encouraged.   Technologist smear review     Status: None   Collection Time: 08/20/14  8:51 PM  Result Value Ref Range   Tech Review ELLIPTOCYTES     Comment: POLYCHROMASIA PRESENT MILD LEFT SHIFT (1-5% METAS, OCC MYELO, OCC BANDS)   CBC with Differential/Platelet     Status: Abnormal   Collection Time: 08/21/14  7:02 AM  Result Value Ref Range   WBC 19.4 (H) 4.0 - 10.5 K/uL   RBC 3.87 3.87 - 5.11 MIL/uL   Hemoglobin 11.2 (L) 12.0 - 15.0 g/dL   HCT 36.2 36.0 - 46.0 %   MCV 93.5 78.0 - 100.0 fL   MCH 28.9 26.0 - 34.0 pg   MCHC 30.9 30.0 - 36.0 g/dL   RDW 15.9 (H) 11.5 - 15.5 %   Platelets 100 (L) 150 - 400 K/uL    Comment: REPEATED TO VERIFY SPECIMEN CHECKED FOR CLOTS    Neutrophils Relative % 89 (H) 43 - 77 %   Neutro Abs 17.2 (H) 1.7 - 7.7 K/uL   Lymphocytes Relative 4 (L) 12 - 46 %   Lymphs Abs 0.8 0.7 - 4.0 K/uL   Monocytes Relative 7 3 - 12 %   Monocytes Absolute 1.3 (H) 0.1 - 1.0 K/uL   Eosinophils Relative 0 0 - 5 %   Eosinophils Absolute 0.0 0.0 - 0.7 K/uL   Basophils Relative 0 0 - 1 %   Basophils Absolute 0.0 0.0 - 0.1 K/uL  Basic metabolic panel     Status: Abnormal   Collection Time: 08/21/14  7:02 AM  Result Value Ref Range   Sodium 135 135 - 145 mmol/L   Potassium 5.5 (H) 3.5 - 5.1 mmol/L   Chloride 104 96 - 112 mmol/L   CO2 17 (L) 19 - 32 mmol/L   Glucose, Bld 83 70 - 99 mg/dL   BUN 41 (H) 6 - 23 mg/dL   Creatinine, Ser 2.67 (H) 0.50 - 1.10 mg/dL   Calcium 8.9 8.4 - 10.5 mg/dL  GFR calc non Af Amer 17 (L) >90 mL/min   GFR calc Af Amer 20 (L) >90 mL/min    Comment: (NOTE) The eGFR has been calculated using the CKD EPI equation. This calculation has not been validated in all clinical situations. eGFR's persistently <90 mL/min signify possible Chronic Kidney Disease.    Anion gap 14 5 - 15  Magnesium     Status: None    Collection Time: 08/21/14  7:02 AM  Result Value Ref Range   Magnesium 2.1 1.5 - 2.5 mg/dL  Protime-INR     Status: Abnormal   Collection Time: 08/21/14  7:02 AM  Result Value Ref Range   Prothrombin Time 37.0 (H) 11.6 - 15.2 seconds   INR 3.70 (H) 0.00 - 1.49  Strep pneumoniae urinary antigen     Status: Abnormal   Collection Time: 08/21/14  8:43 AM  Result Value Ref Range   Strep Pneumo Urinary Antigen POSITIVE (A) NEGATIVE   @BRIEFLABTABLE(sdes,specrequest,cult,reptstatus)   ) Recent Results (from the past 720 hour(s))  Blood culture (routine x 2)     Status: None (Preliminary result)   Collection Time: 08/20/14  2:34 PM  Result Value Ref Range Status   Specimen Description BLOOD RIGHT HAND  Final   Special Requests BOTTLES DRAWN AEROBIC ONLY 2CC  Final   Culture   Final           BLOOD CULTURE RECEIVED NO GROWTH TO DATE CULTURE WILL BE HELD FOR 5 DAYS BEFORE ISSUING A FINAL NEGATIVE REPORT Performed at [facility]    Report Status PENDING  Incomplete  MRSA PCR Screening     Status: None   Collection Time: 08/20/14  8:45 PM  Result Value Ref Range Status   MRSA by PCR NEGATIVE NEGATIVE Final    Comment:        The GeneXpert MRSA Assay (FDA approved for NASAL specimens only), is one component of a comprehensive MRSA colonization surveillance program. It is not intended to diagnose MRSA infection nor to guide or monitor treatment for MRSA infections.      Impression/Recommendation  Principal Problem:   CAP (community acquired pneumonia) Active Problems:   Atrial fibrillation   CKD (chronic kidney disease), stage IV   History of renal transplant   HTN (hypertension)   Hypothyroidism   Crohn's disease   Allergic rhinitis   History of DVT (deep vein thrombosis)   GERD (gastroesophageal reflux disease)   SLE (systemic lupus erythematosus)   OSA (obstructive sleep apnea)   Immunosuppressed status   Sepsis   Anemia of chronic disease    Syncope   Chayce D Dimmick is a 73 y.o. female with  Renal transplant patient with RLL pneumonia, sepsis, syncopal event with apparent pneumococcal PNA (Pneumococcal ag is + in urine)  #1 Pneumococcal PNA with sepis syncope --would increase Rocephin to 2g daily in case she is also bacteremic --agree with increase in her prednisone in setting of acute illness  #2 leukocytosis: Could be due to increase of her steroid dose  #3 loose stools her story is not at all consistent with C. Difficile colitis and she is also taking magnesium oxide which is known to cause diarrhea.  #4 She should be screened for HIV and HCV   08/21/2014, 1:40 PM   Thank you so much for this interesting consult  [facility] [phone] (pager) [phone] (office) 08/21/2014, 1:40 PM  Cornelius Van Dam 08/21/2014, 1:40 PM     

## 2014-08-21 NOTE — Progress Notes (Signed)
Subjective:  Feels coughing is better but has some rib pain from coughing. Pain is also better. Had fever 103.1 overnight, afebrile currently. No longer having nausea/vomitting/diarrhea.   Objective: Vital signs in last 24 hours: Filed Vitals:   08/21/14 0552 08/21/14 0724 08/21/14 0726 08/21/14 0845  BP: 109/53   108/56  Pulse: 62 63  72  Temp: 97.7 F (36.5 C)   98 F (36.7 C)  TempSrc: Oral   Oral  Resp: 18   Amanda  Height:      Weight: 129 lb 3 oz (58.6 kg)     SpO2: 100% 94% 94% 96%   Weight change:   Intake/Output Summary (Last 24 hours) at 08/21/14 1127 Last data filed at 08/21/14 0900  Gross per 24 hour  Intake   1260 ml  Output    250 ml  Net   1010 ml   Vitals reviewed. General: resting in bed, NAD HEENT: PERRL, EOMI, no scleral icterus Cardiac: RRR, no rubs, murmurs or gallops Pulm: some upper airway congestion, cleared with coughing, has crackles on RLL, no wheezing.  Abd: soft, nontender, nondistended, BS present, surgical hernia on RLQ. Ext: warm and well perfused, no pedal edema Neuro: alert and oriented X3, cranial nerves II-XII grossly intact, strength and sensation to light touch equal in bilateral upper and lower extremities  Lab Results: Basic Metabolic Panel:  Recent Labs Lab 08/20/14 1536 08/20/14 1549 08/20/14 2051 08/21/14 0702  NA 133* 135  --  135  K 4.6 4.6  --  5.5*  CL 103 104  --  104  CO2 Amanda  --   --  17*  GLUCOSE 94 91  --  83  BUN 42* 44*  --  41*  CREATININE 2.40* 2.40*  --  2.67*  CALCIUM 8.6  --   --  8.9  MG  --   --  1.4* 2.1  PHOS  --   --  3.1  --    Liver Function Tests:  Recent Labs Lab 08/20/14 2051  AST 26  ALT Amanda  ALKPHOS 94  BILITOT 0.7  PROT 6.0  ALBUMIN 3.1*   No results for input(s): LIPASE, AMYLASE in the last 168 hours. No results for input(s): AMMONIA in the last 168 hours. CBC:  Recent Labs Lab 08/20/14 1536 08/20/14 1549 08/21/14 0702  WBC 14.7*  --  19.4*  NEUTROABS 12.0*  --  17.2*    HGB 10.1* 12.6 11.2*  HCT 33.1* 37.0 36.2  MCV 92.7  --  93.5  PLT 117*  --  100*  Coagulation:  Recent Labs Lab 08/20/14 1536 08/21/14 0702  LABPROT 34.1* 37.0*  INR 3.34* 3.70*   Urinalysis:  Recent Labs Lab 08/20/14 1650  COLORURINE YELLOW  LABSPEC 1.021  PHURINE 5.0  GLUCOSEU NEGATIVE  HGBUR NEGATIVE  BILIRUBINUR NEGATIVE  KETONESUR NEGATIVE  PROTEINUR NEGATIVE  UROBILINOGEN 0.2  NITRITE NEGATIVE  LEUKOCYTESUR NEGATIVE   Micro Results: Recent Results (from the past 240 hour(s))  Blood culture (routine x 2)     Status: None (Preliminary result)   Collection Time: 08/20/14  2:34 PM  Result Value Ref Range Status   Specimen Description BLOOD RIGHT HAND  Final   Special Requests BOTTLES DRAWN AEROBIC ONLY 2CC  Final   Culture   Final           BLOOD CULTURE RECEIVED NO GROWTH TO DATE CULTURE WILL BE HELD FOR 5 DAYS BEFORE ISSUING A FINAL NEGATIVE REPORT Performed at [facility]    Report Status PENDING  Incomplete  MRSA PCR Screening     Status: None   Collection Time: 08/20/14  8:45 PM  Result Value Ref Range Status   MRSA by PCR NEGATIVE NEGATIVE Final    Comment:        The GeneXpert MRSA Assay (FDA approved for NASAL specimens only), is one component of a comprehensive MRSA colonization surveillance program. It is not intended to diagnose MRSA infection nor to guide or monitor treatment for MRSA infections.    Studies/Results: Dg Chest 2 View  08/20/2014   CLINICAL DATA:  Fever, cough, lower RIGHT chest pain during coughing for 2 days, history hypertension, DVT, Crohn's disease, lupus, renal cell carcinoma  EXAM: CHEST  2 VIEW  COMPARISON:  05/29/2013  FINDINGS: Enlargement of cardiac silhouette.  Calcified tortuous aorta.  Pulmonary vascularity normal.  RIGHT lower lobe infiltrate consistent with pneumonia.  Mild central peribronchial thickening.  Minimal atelectasis at lung bases and scattered thickening of the fissures.  Upper lungs clear.   No definite pleural effusion or pneumothorax.  Numerous surgical clips and IVC filter noted in upper abdomen.  Bones demineralized.  IMPRESSION: RIGHT lower lobe pneumonia.  Enlargement of cardiac silhouette.  Bibasilar atelectasis.   Electronically Signed   By: Lavonia Dana M.D.   On: 08/20/2014 15:21   Medications: I have reviewed the patient's current medications. Scheduled Meds: . amiodarone  100 mg Oral BID  . antiseptic oral rinse  7 mL Mouth Rinse BID  . calcitRIOL  0.25 mcg Oral QODAY  . cefTRIAXone (ROCEPHIN)  IV  1 g Intravenous Q24H  . dextromethorphan-guaiFENesin  1 tablet Oral BID  . feeding supplement (ENSURE ENLIVE)  237 mL Oral BID BM  . gabapentin  200 mg Oral QHS  . ipratropium-albuterol  3 mL Nebulization TID  . levothyroxine  100 mcg Oral QAC breakfast  . loratadine  10 mg Oral Daily  . metoprolol  100 mg Oral BID  . mycophenolate  360 mg Oral BID  . pantoprazole  40 mg Oral Daily  . predniSONE  10 mg Oral Daily  . tacrolimus  4 mg Oral QPM  . tacrolimus  5 mg Oral q morning - 10a  . Warfarin - Pharmacist Dosing Inpatient   Does not apply q1800   Continuous Infusions: . sodium chloride 1,000 mL (08/21/14 0818)   PRN Meds:.acetaminophen, albuterol, benzonatate, HYDROcodone-acetaminophen, ondansetron (ZOFRAN) IV Assessment/Plan: Principal Problem:   CAP (community acquired pneumonia) Active Problems:   Atrial fibrillation   CKD (chronic kidney disease), stage IV   History of renal transplant   HTN (hypertension)   Hypothyroidism   Crohn's disease   Allergic rhinitis   History of DVT (deep vein thrombosis)   GERD (gastroesophageal reflux disease)   SLE (systemic lupus erythematosus)   OSA (obstructive sleep apnea)   Immunosuppressed status   Sepsis   Anemia of chronic disease   Syncope  74 yo Mcfarland with kidney transplant CKD IV, on immunosuppresion, here with CAP.  CAP positive for Strep pneum urine antigen - has leukocytosis (going up to 14.7 >  19.4), febrile 103 overnight, CXR showing RLL infiltrate. Patient is complicated given she is on immunosuppression and also has allergies to lot of antibiotics. - continue ceftriaxone for now. Missing HCAP coverage (not sure if would meet definition of HCAP based on just her renal transplant hx). Could do Vanc and aztreonam for broaden coverage if worsens. - consulted ID for their advice about abx and also  duration.  - f/up sputum cx, blood cux, legionella. - o2 sat to keep sat >92%.  - cont duoneb to see if it helps. - tessalon.   HyperK+ today 5.5 - recheck BMET and treat if high.  Longstanding PAF and hx of DVT - on chronic coumadin - cont amio 74m and metoprolol. Echo 2/14 [place] mild to moderate AR normal EF   HTN - cont metoprolol.  CKD IV - bl gfr around Amanda. was on HD in the past, s/p kidney transplant - crt and gfr around baseline now. F/up BMET. - cont prograf 5 mg morning, 4 mg evening. - cont prednisone 123mdaily (5 mg normally, for recent illness) - cont mycophenolate.  Chronic normocytic anemia -- of chronic disease. b/l hgb around 10.  - stable. Monitor. Gets Epo injections every 21 days. Due 08/21/14.   hypomag - cont mag ox Hypothyroidism - cont levothyroxine GERD - cont ppi.  Dispo: Disposition is deferred at this time, awaiting improvement of current medical problems.  Anticipated discharge in approximately 1-2 day(s).   The patient does have a current PCP (JMauricia AreaMD) and [facility].  The patient does have transportation limitations that hinder transportation to clinic appointments.  .Services Needed at time of discharge: Y = Yes, Blank = No PT:   OT:   RN:   Equipment:   Other:     LOS: 1 day   TaDellia NimsMD 08/21/2014, 11:27 AM

## 2014-08-21 NOTE — Progress Notes (Signed)
Utilization review completed.  

## 2014-08-21 NOTE — Progress Notes (Signed)
Internal Medicine Attending  Date: 08/21/2014  Patient name: [name] Medical record number: [id number] Date of birth: February 01, 1941 Age: 74 y.o. Gender: female  I saw and evaluated the patient, and discussed her care with resident Dr. Genene Churn; please see attending admission note.

## 2014-08-21 NOTE — Consult Note (Signed)
Amanda Miner EdD

## 2014-08-21 NOTE — Progress Notes (Addendum)
INITIAL NUTRITION ASSESSMENT  DOCUMENTATION CODES Per approved criteria  -Not Applicable   INTERVENTION: -Continue Ensure Enlive po BID, each supplement provides 350 kcal and 20 grams of protein -Consider liberalizing as much as medically feasible to increase nutritional intake  NUTRITION DIAGNOSIS: Inadequate oral intake related to altered taste perception as evidenced by PO < 50%.   Goal: Pt will meet >90% of estimated nutrition needs  Monitor:  PO/supplement intake, labs, weight changes, I/O's  Reason for Assessment: MST=2  74 y.o. 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 Reported fever 101 this morning, coughing up yellow sputum, has been feeling weak for 2 days. Has runny nose 1-2 days. Also having diarrhea, nausea, vomiting. Having rib pain from coughing severely. Also having some muscle ache. Did receive flut shot. No sick contacts. Denies any other problems. Has been following with Dr. Jimmy Footman for kidney transplant. No issues. Was doing well prior to last 2 days. Follows with Dr. Percell Miller [facility] for hx of crohn's. No recent flares. Not on meds for it. Has hx of allergic rxn to many abx.stated vanc and gent was ok in the past.  ASSESSMENT: Pt admitted with CAP. 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  Attempted to examine pt x 3 today, however, pt was working with multiple providers at times of visit.  Pt is currently on a renal diet. Meal intake 50% per doc flowsheets. Noted a 17.6% wt loss x 6 months, however, suspect wt of 156# may be an outlier. UBE around 130#.  Spoke with RN who reports pt with poor appetite currently and PTA. Pt has altered taste perception. Per RN report, pt complains that "nothing tastes right". She reported that she ate only a few bites of her  chicken salad and refused Lorna Dune cookies. She is taking the chocolate Ensure very well. Labs reviewed. K: 5.5, Cl: 17, BUN/Creat: 41/2.67 Height: Ht Readings from Last 1 Encounters:  08/20/14 _0  (1.6 m)    Weight: Wt Readings from Last 1 Encounters:  08/21/14 129 lb 3 oz (58.6 kg)    Ideal Body Weight: 115#  % Ideal Body Weight: 112%  Wt Readings from Last 10 Encounters:  08/21/14 129 lb 3 oz (58.6 kg)  03/05/14 156 lb 1.4 oz (70.8 kg)  10/15/13 130 lb (58.968 kg)  09/04/13 130 lb (58.968 kg)  08/30/13 131 lb (59.421 kg)  08/12/13 131 lb 3.2 oz (59.512 kg)  07/23/13 137 lb (62.143 kg)  02/15/13 137 lb (62.143 kg)  04/23/12 138 lb (62.596 kg)  12/20/11 133 lb 3.2 oz (60.419 kg)    Usual Body Weight: 130#  % Usual Body Weight: 99%  BMI:  Body mass index is 22.89 kg/(m^2). Normal weight loss  Estimated Nutritional Needs: Kcal: 1700-1900 Protein: 70-80 grams Fluid: 1.7-1.9 L  Skin: WDL  Diet Order: Diet renal with fluid restriction Room service appropriate?: Yes; Fluid consistency:: Thin  EDUCATION NEEDS: -Education not appropriate at this time   Intake/Output Summary (Last 24 hours) at 08/21/14 1241 Last data filed at 08/21/14 0900  Gross per 24 hour  Intake   1260 ml  Output    250 ml  Net   1010 ml    Last BM: 08/20/14  Labs:   Recent Labs Lab 08/20/14 1536 08/20/14 1549 08/20/14  2051 08/21/14 0702  NA 133* 135  --  135  K 4.6 4.6  --  5.5*  CL 103 104  --  104  CO2 20  --   --  17*  BUN 42* 44*  --  41*  CREATININE 2.40* 2.40*  --  2.67*  CALCIUM 8.6  --   --  8.9  MG  --   --  1.4* 2.1  PHOS  --   --  3.1  --   GLUCOSE 94 91  --  83    CBG (last 3)  No results for input(s): GLUCAP in the last 72 hours.  Scheduled Meds: . amiodarone  100 mg Oral BID  . antiseptic oral rinse  7 mL Mouth Rinse BID  . calcitRIOL  0.25 mcg Oral QODAY  . cefTRIAXone (ROCEPHIN)  IV  1 g Intravenous Q24H  . dextromethorphan-guaiFENesin  1 tablet  Oral BID  . feeding supplement (ENSURE ENLIVE)  237 mL Oral BID BM  . gabapentin  200 mg Oral QHS  . ipratropium-albuterol  3 mL Nebulization TID  . levothyroxine  100 mcg Oral QAC breakfast  . loratadine  10 mg Oral Daily  . metoprolol  100 mg Oral BID  . mycophenolate  360 mg Oral BID  . pantoprazole  40 mg Oral Daily  . predniSONE  10 mg Oral Daily  . tacrolimus  4 mg Oral QPM  . tacrolimus  5 mg Oral q morning - 10a  . Warfarin - Pharmacist Dosing Inpatient   Does not apply q1800    Continuous Infusions: . sodium chloride 1,000 mL (08/21/14 0818)    Past Medical History  Diagnosis Date  . Crohn's disease   . Hypertension   . Refusal of blood transfusion for reasons of conscience 12/20/2011    pt is [employer]'s [employer]  . Atrial fibrillation   . DVT of leg (deep venous thrombosis) 2012-8./13/2013    "have had one in each leg now"  . Numbness and tingling of foot     bilaterally  . GERD (gastroesophageal reflux disease)   . Hypothyroidism   . Lower GI bleed 2012  . DVT of lower extremity, bilateral     "have had them in both legs; last one was on the RLE this year" (04/25/2012)  . OSA on CPAP   . Anemia     "when lupus flares up"  . SLE (systemic lupus erythematosus)   . Arthritis     "in my joints"  . Renal disorder     S/P nephrectomy; dialysis; "working fine now" (12/20/11)  . Renal cell carcinoma   . CAP (community acquired pneumonia) 08/20/2014    Past Surgical History  Procedure Laterality Date  . Cesarean section  1984  . Nephrectomy transplanted organ  2006    bilaterally  . Colectomy  1998    "removal of abscess" (04/25/2012)  . Excisional hemorrhoidectomy    . Cataract extraction w/ intraocular lens  implant, bilateral  2005-2010  . Vena cava filter placement  2012  . Insertion of dialysis catheter  1988-2006    "peritoneal and hemodialysis; had multiple grafts and fistulas; last graft clotted off 2012"  . Cholecystectomy  1995  . Colostomy  1998  .  Colostomy reversal  1999    "6 months after placed" (04/25/2012)  . Bone marrow biopsy  1993; 1995  . Abdominal exploration surgery  1995    "found sluggish bone marrow" (04/25/2012)  . Parathyroid implant removal Left  removed parathyroid from neck and implanted in arm  . Colonoscopy N/A 08/30/2013    Procedure: COLONOSCOPY;  Surgeon: Winfield Cunas., MD;  Location: [facility];  Service: Endoscopy;  Laterality: N/A;    Jadyn Brasher A. Jimmye Norman, RD, LDN, CDE Pager: [phone] After hours Pager: [phone]

## 2014-08-21 NOTE — Progress Notes (Addendum)
Pt. Left on enteric precautions since it hasn't been 48 hours since her last diarrhea episode, per protocol.  Will continue to monitor & collect stool if pt. Has one.  Alphonzo Lemmings, RN

## 2014-08-21 NOTE — Care Management Note (Addendum)
    Page 1 of 2   08/26/2014     4:38:41 PM CARE MANAGEMENT NOTE 08/26/2014  Patient:  [name], [name]   Account Number:  [id number]  Date Initiated:  08/21/2014  Documentation initiated by:  Carles Collet  Subjective/Objective Assessment:   PNU, IV antibioyics, from home, lives with spouse     Action/Plan:   will follow for any needs   Anticipated DC Date:  08/26/2014   Anticipated DC Plan:  [place]  In-house referral  Clinical Social Worker      DC Forensic scientist  CM consult      [facility] Choice  HOME HEALTH   Choice offered to / List presented to:  C-1 Patient        [place] arranged  HH-2 PT      [place].   Status of service:  Completed, signed off Medicare Important Message given?  YES (If response is "NO", the following Medicare IM given date fields will be blank) Date Medicare IM given:  08/25/2014 Medicare IM given by:  Tomi Bamberger Date Additional Medicare IM given:   Additional Medicare IM given by:    Discharge Disposition:  [place]  Per UR Regulation:  Reviewed for med. necessity/level of care/duration of stay  If discussed at [place] of Stay Meetings, dates discussed:   08/26/2014    Comments:  08/26/14 1242 Tomi Bamberger RN, BSN [phone] patient is for dc today, she chose [facility] for hhpt, referral made to [facility], [place] notified.  Soc will begin 24-48 hrs post dc. patient has a rolling walker at home.  08-21-14 will follow pt for any CM needs. Carles Collet RN BSN CM

## 2014-08-21 NOTE — Progress Notes (Signed)
[name] for coumadin Indication: hx DVT and PAF  Allergies  Allergen Reactions  . Amoxicillin Anaphylaxis  . Ampicillin Anaphylaxis  . Ciprofloxacin Swelling    "of my throat"  . Erythromycin Anaphylaxis  . Penicillins Anaphylaxis  . Sulfa Antibiotics Itching  . Sulfamethoxazole-Trimethoprim Itching  . Tetracycline Anaphylaxis  . Labetalol Nausea And Vomiting    Patient Measurements: Height: 5' 3"  (160 cm) Weight: 129 lb 3 oz (58.6 kg) IBW/kg (Calculated) : 52.4  Vital Signs: Temp: 98 F (36.7 C) (04/14 0845) Temp Source: Oral (04/14 0845) BP: 108/56 mmHg (04/14 0845) Pulse Rate: 72 (04/14 0845)  Labs:  Recent Labs  08/20/14 1536 08/20/14 1549 08/21/14 0702  HGB 10.1* 12.6 11.2*  HCT 33.1* 37.0 36.2  PLT 117*  --  100*  LABPROT 34.1*  --  37.0*  INR 3.34*  --  3.70*  CREATININE 2.40* 2.40* 2.67*    Estimated Creatinine Clearance: 16 mL/min (by C-G formula based on Cr of 2.67).   Assessment: 77 YOF on warfarin PTA for hx DVT and PAF. Admission INR elevated at 3.34 and dose was held last evening. INR this morning rose to 3.7.   Spoke with patient and she stated she takes alternating 2.26m and 1.267m However, she told me she uses the 77m4mablets and halves and then quarters the tablets for her dose. Concerned this regimen is potentially too complicated and/or she is having problems cutting the tablets (tablets are very difficult to quarter). Will try and get her on a more consistent warfarin regimen when she is ready for discharge.  She also told me her urine was a little red, but no overt blood and she questions if it is due to infection.  Goal of Therapy:  INR 2-3 Monitor platelets by anticoagulation protocol: Yes   Plan:  -hold warfarin today -daily PT/INR -Monitor for bleeding complications  Thanks for allowing pharmacy to be a part of this patient's care.  Maahir Horst D. Ruthanne Mcneish, PharmD, BCPS Clinical  Pharmacist Pager: [phone]/2016 10:48 AM

## 2014-08-22 LAB — CBC WITH DIFFERENTIAL/PLATELET
Basophils Absolute: 0 10*3/uL (ref 0.0–0.1)
Basophils Relative: 0 % (ref 0–1)
EOS ABS: 0.1 10*3/uL (ref 0.0–0.7)
Eosinophils Relative: 0 % (ref 0–5)
HCT: 28.5 % — ABNORMAL LOW (ref 36.0–46.0)
Hemoglobin: 9 g/dL — ABNORMAL LOW (ref 12.0–15.0)
Lymphocytes Relative: 5 % — ABNORMAL LOW (ref 12–46)
Lymphs Abs: 0.9 10*3/uL (ref 0.7–4.0)
MCH: 28.9 pg (ref 26.0–34.0)
MCHC: 31.2 g/dL (ref 30.0–36.0)
MCV: 92.5 fL (ref 78.0–100.0)
Monocytes Absolute: 1.5 10*3/uL — ABNORMAL HIGH (ref 0.1–1.0)
Monocytes Relative: 9 % (ref 3–12)
NEUTROS ABS: 14.5 10*3/uL — AB (ref 1.7–7.7)
NEUTROS PCT: 85 % — AB (ref 43–77)
PLATELETS: 104 10*3/uL — AB (ref 150–400)
RBC: 3.08 MIL/uL — ABNORMAL LOW (ref 3.87–5.11)
RDW: 15.8 % — AB (ref 11.5–15.5)
WBC: 17 10*3/uL — AB (ref 4.0–10.5)

## 2014-08-22 LAB — BASIC METABOLIC PANEL
ANION GAP: 13 (ref 5–15)
BUN: 48 mg/dL — ABNORMAL HIGH (ref 6–23)
CHLORIDE: 104 mmol/L (ref 96–112)
CO2: 19 mmol/L (ref 19–32)
CREATININE: 2.88 mg/dL — AB (ref 0.50–1.10)
Calcium: 8.5 mg/dL (ref 8.4–10.5)
GFR, EST AFRICAN AMERICAN: 18 mL/min — AB (ref >90)
GFR, EST NON AFRICAN AMERICAN: 15 mL/min — AB (ref >90)
Glucose, Bld: 99 mg/dL (ref 70–99)
Potassium: 4.1 mmol/L (ref 3.5–5.1)
Sodium: 136 mmol/L (ref 135–145)

## 2014-08-22 LAB — MAGNESIUM: MAGNESIUM: 2.1 mg/dL (ref 1.5–2.5)

## 2014-08-22 LAB — LEGIONELLA ANTIGEN, URINE

## 2014-08-22 LAB — CLOSTRIDIUM DIFFICILE BY PCR: CDIFFPCR: NEGATIVE

## 2014-08-22 LAB — PROTIME-INR
INR: 4.7 — ABNORMAL HIGH (ref 0.00–1.49)
Prothrombin Time: 44.6 seconds — ABNORMAL HIGH (ref 11.6–15.2)

## 2014-08-22 LAB — PROCALCITONIN: Procalcitonin: 27.99 ng/mL

## 2014-08-22 MED ORDER — IPRATROPIUM-ALBUTEROL 0.5-2.5 (3) MG/3ML IN SOLN
3.0000 mL | RESPIRATORY_TRACT | Status: DC | PRN
  Administered 2014-08-23 – 2014-08-24 (×2): 3 mL via RESPIRATORY_TRACT
  Filled 2014-08-22 (×2): qty 3

## 2014-08-22 MED ORDER — DIPHENHYDRAMINE HCL 25 MG PO CAPS
25.0000 mg | ORAL_CAPSULE | Freq: Once | ORAL | Status: AC
  Administered 2014-08-22: 25 mg via ORAL
  Filled 2014-08-22: qty 1

## 2014-08-22 MED ORDER — DARBEPOETIN ALFA 200 MCG/0.4ML IJ SOSY
200.0000 ug | PREFILLED_SYRINGE | Freq: Once | INTRAMUSCULAR | Status: AC
  Administered 2014-08-22: 200 ug via SUBCUTANEOUS
  Filled 2014-08-22: qty 0.4

## 2014-08-22 MED ORDER — DARBEPOETIN ALFA 200 MCG/0.4ML IJ SOSY
200.0000 ug | PREFILLED_SYRINGE | Freq: Once | INTRAMUSCULAR | Status: DC
  Filled 2014-08-22: qty 0.4

## 2014-08-22 NOTE — Progress Notes (Signed)
ANTICOAGULATION CONSULT NOTE - Follow Up Consult  Pharmacy Consult for Coumadin Indication: h/o DVT and afib   Allergies  Allergen Reactions  . Amoxicillin Anaphylaxis  . Ampicillin Anaphylaxis  . Ciprofloxacin Swelling    "of my throat"  . Erythromycin Anaphylaxis  . Penicillins Anaphylaxis  . Sulfa Antibiotics Itching  . Sulfamethoxazole-Trimethoprim Itching  . Tetracycline Anaphylaxis  . Labetalol Nausea And Vomiting    Patient Measurements: Height: 5' 3"  (160 cm) Weight: 135 lb 14.4 oz (61.644 kg) IBW/kg (Calculated) : 52.4 Heparin Dosing Weight:    Vital Signs: Temp: 97.7 F (36.5 C) (04/15 0609) Temp Source: Oral (04/15 0609) BP: 126/58 mmHg (04/15 0609) Pulse Rate: 58 (04/15 0609)  Labs:  Recent Labs  08/20/14 1536 08/20/14 1549 08/21/14 0702 08/21/14 1526 08/22/14 0545  HGB 10.1* 12.6 11.2*  --  9.0*  HCT 33.1* 37.0 36.2  --  28.5*  PLT 117*  --  100*  --  104*  LABPROT 34.1*  --  37.0*  --  44.6*  INR 3.34*  --  3.70*  --  4.70*  CREATININE 2.40* 2.40* 2.67* 2.78* 2.88*    Estimated Creatinine Clearance: 14.8 mL/min (by C-G formula based on Cr of 2.88).   Assessment: 74 yo lady with CAP, to continue coumadin for hx DVT and PAF  AC: INR 3.34 on admit, now up to 4.7 *home dose is alternating 2.73m with 1.231m(halves and quarters 7m17mabs). Thinks her last dose was 1.25 on 4/12. Simplify regimen (1.25 on specific days and recommend prescription for 2.7mg67mbs)  ID: CAP- extensive allergies, so not on azith, just CTX. Strep pneumo +, RLL infiltrate. Prednisone incr with acute illness to 10 daily (normally on 7mg/70mTA). Afebrile. WBC 17. CTX 4/13>>  Cards: HTN, BP ok but HR 58- amio, Lopressor  Endo: Synthroid  GI: renal diet, po PPI for GERD  Neuro: Gabbapentin  Renal: ESRD d/t SLE s/p transplant. On mycophen 360 BID, pred 5 daily, tacro 5 qAM/4 qPM- all resumed appropriately. Lat PTH from 2013, no vit D. calcitriol  Pulm: RA but O2 89%  this AM.  Heme/Onc: Epoalfa 40,000 units q21 days- last dose 4/12, next due on 5/3. Discontinued for now as we don't carry- if still here, would conversion to Aranesp  PTA Medication Issues: addressed   Plan: -hold warfarin -daily PT/INR   Goal of Therapy:  INR 2-3 Monitor platelets by anticoagulation protocol: Yes   Curby Carswell S. RoberAlford HighlandrmD, BCPS Clinical Staff Pharmacist Pager [phone] Ghazilinger 08/22/2014,8:59 AM

## 2014-08-22 NOTE — Progress Notes (Signed)
HPI: [name] is an 74 y.o. 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 She is felt to have RLL PNA poss due to pneumococcus( ag is pos in urine).  Creatinine on admission yesterday was 2.67 on 4/14, today 4/15 2.88, and on 08/20/14 it was 2.40, on 3/17 2.67 and 06/05/14 2.55m/dl.  She has been maintained on her regular doses of immunosuppressants except an increase in prednisone.   Past Medical History  Diagnosis Date  . Crohn's disease   . Hypertension   . Refusal of blood transfusion for reasons of conscience 12/20/2011    pt is Je[employer]'s Witness  . Atrial fibrillation   . DVT of leg (deep venous thrombosis) 2012-8./13/2013    "have had one in each leg now"  . Numbness and tingling of foot     bilaterally  . GERD (gastroesophageal reflux disease)   . Hypothyroidism   . Lower GI bleed 2012  . DVT of lower extremity, bilateral     "have had them in both legs; last one was on the RLE this year" (04/25/2012)  . OSA on CPAP   . Anemia     "when lupus flares up"  . SLE (systemic lupus erythematosus)   . Arthritis     "in my joints"  . Renal disorder     S/P nephrectomy; dialysis; "working fine now" (12/20/11)  . Renal cell carcinoma   . CAP (community acquired pneumonia) 08/20/2014   Past Surgical History  Procedure Laterality Date  . Cesarean section  1984  . Nephrectomy transplanted organ  2006    bilaterally  . Colectomy  1998    "removal of abscess" (04/25/2012)  . Excisional hemorrhoidectomy    . Cataract extraction w/ intraocular lens  implant, bilateral  2005-2010  . Vena cava filter placement  2012  . Insertion of dialysis catheter  1988-2006    "peritoneal and hemodialysis; had multiple  grafts and fistulas; last graft clotted off 2012"  . Cholecystectomy  1995  . Colostomy  1998  . Colostomy reversal  1999    "6 months after placed" (04/25/2012)  . Bone marrow biopsy  1993; 1995  . Abdominal exploration surgery  1995    "found sluggish bone marrow" (04/25/2012)  . Parathyroid implant removal Left     removed parathyroid from neck and implanted in arm  . Colonoscopy N/A 08/30/2013    Procedure: COLONOSCOPY;  Surgeon: JWinfield Cunas, MD;  [facility];  Service: Endoscopy;  Laterality: N/A;   Social History:  reports that she has never smoked. She has never used smokeless tobacco. She reports that she does not drink alcohol or use illicit drugs. Allergies:  Allergies  Allergen Reactions  . Amoxicillin Anaphylaxis  . Ampicillin Anaphylaxis  . Ciprofloxacin Swelling    "of my throat"  . Erythromycin Anaphylaxis  . Penicillins Anaphylaxis  . Sulfa Antibiotics Itching  . Sulfamethoxazole-Trimethoprim Itching  . Tetracycline Anaphylaxis  . Labetalol Nausea And Vomiting   Family History  Problem Relation Age of Onset  . Heart disease Mother   . Hypertension      runs in family  . Sleep apnea Sister     Medications:  Scheduled: . amiodarone  100 mg Oral BID  . antiseptic oral rinse  7 mL Mouth Rinse BID  . calcitRIOL  0.25 mcg Oral QODAY  . cefTRIAXone (ROCEPHIN)  IV  2 g Intravenous Q24H  . Darbepoetin Alfa  200 mcg Subcutaneous Once  . dextromethorphan-guaiFENesin  1 tablet Oral BID  . feeding supplement (ENSURE ENLIVE)  237 mL Oral BID BM  . fluticasone  2 spray Each Nare Daily  . gabapentin  200 mg Oral QHS  . ipratropium-albuterol  3 mL Nebulization TID  . levothyroxine  100 mcg Oral QAC breakfast  . loratadine  10 mg Oral Daily  . metoprolol  100 mg Oral BID  . mycophenolate  360 mg Oral BID  . pantoprazole  40 mg Oral Daily  . predniSONE  10 mg Oral Daily  . tacrolimus  4 mg Oral QPM  . tacrolimus  5 mg Oral q morning - 10a  .  Warfarin - Pharmacist Dosing Inpatient   Does not apply q1800    ROS: otherwise non contributory to current illness  Blood pressure 130/62, pulse 73, temperature 97.6 F (36.4 C), temperature source Oral, resp. rate 18, height 5' 3" (1.6 m), weight 61.644 kg (135 lb 14.4 oz), last menstrual period 03/05/2014, SpO2 95 %.  General appearance: alert and cooperative Head: Normocephalic, without obvious abnormality, atraumatic Eyes: negative Ears: normal TM's and external ear canals both ears Nose: Nares normal. Septum midline. Mucosa normal. No drainage or sinus tenderness. Throat: normal findings: lips normal without lesions Resp: clear to auscultation bilaterally Chest wall: no tenderness Cardio: regular rate and rhythm, S1, S2 normal, no murmur, click, rub or gallop GI: multiple surgical scar deformities Extremities: extremities normal, atraumatic, no cyanosis or edema, surgical scars on arms Neurologic: Ox3 Results for orders placed or performed during the hospital encounter of 08/20/14 (from the past 48 hour(s))  Urinalysis, Routine w reflex microscopic     Status: None   Collection Time: 08/20/14  4:50 PM  Result Value Ref Range   Color, Urine YELLOW YELLOW   APPearance CLEAR CLEAR   Specific Gravity, Urine 1.021 1.005 - 1.030   pH 5.0 5.0 - 8.0   Glucose, UA NEGATIVE NEGATIVE mg/dL   Hgb urine dipstick NEGATIVE NEGATIVE   Bilirubin Urine NEGATIVE NEGATIVE   Ketones, ur NEGATIVE NEGATIVE mg/dL   Protein, ur NEGATIVE NEGATIVE mg/dL   Urobilinogen, UA 0.2 0.0 - 1.0 mg/dL   Nitrite NEGATIVE NEGATIVE   Leukocytes, UA NEGATIVE NEGATIVE    Comment: MICROSCOPIC NOT DONE ON URINES WITH NEGATIVE PROTEIN, BLOOD, LEUKOCYTES, NITRITE, OR GLUCOSE <1000 mg/dL.  Influenza panel by PCR (type A & B, H1N1)     Status: None   Collection Time: 08/20/14  8:45 PM  Result Value Ref Range   Influenza A By PCR NEGATIVE NEGATIVE   Influenza B By PCR NEGATIVE NEGATIVE   H1N1 flu by pcr NOT DETECTED  NOT DETECTED    Comment:        The Xpert Flu assay (FDA approved for nasal aspirates or washes and nasopharyngeal swab specimens), is intended as an aid in the diagnosis of influenza and should not be used as a sole basis for treatment.   MRSA PCR Screening     Status: None   Collection Time: 08/20/14  8:45 PM  Result Value Ref Range   MRSA by PCR NEGATIVE NEGATIVE    Comment:        The GeneXpert MRSA Assay (FDA approved for NASAL specimens only), is one component of a comprehensive MRSA colonization surveillance program. It is not  intended to diagnose MRSA infection nor to guide or monitor treatment for MRSA infections.   Hepatic function panel     Status: Abnormal   Collection Time: 08/20/14  8:51 PM  Result Value Ref Range   Total Protein 6.0 6.0 - 8.3 g/dL   Albumin 3.1 (L) 3.5 - 5.2 g/dL   AST 26 0 - 37 U/L   ALT 20 0 - 35 U/L   Alkaline Phosphatase 94 39 - 117 U/L   Total Bilirubin 0.7 0.3 - 1.2 mg/dL   Bilirubin, Direct 0.3 0.0 - 0.5 mg/dL   Indirect Bilirubin 0.4 0.3 - 0.9 mg/dL  Magnesium     Status: Abnormal   Collection Time: 08/20/14  8:51 PM  Result Value Ref Range   Magnesium 1.4 (L) 1.5 - 2.5 mg/dL  Phosphorus     Status: None   Collection Time: 08/20/14  8:51 PM  Result Value Ref Range   Phosphorus 3.1 2.3 - 4.6 mg/dL  Procalcitonin     Status: None   Collection Time: 08/20/14  8:51 PM  Result Value Ref Range   Procalcitonin 2.65 ng/mL    Comment:        Interpretation: PCT > 2 ng/mL: Systemic infection (sepsis) is likely, unless other causes are known. (NOTE)         ICU PCT Algorithm               Non ICU PCT Algorithm    ----------------------------     ------------------------------         PCT < 0.25 ng/mL                 PCT < 0.1 ng/mL     Stopping of antibiotics            Stopping of antibiotics       strongly encouraged.               strongly encouraged.    ----------------------------     ------------------------------       PCT  level decrease by               PCT < 0.25 ng/mL       >= 80% from peak PCT       OR PCT 0.25 - 0.5 ng/mL          Stopping of antibiotics                                             encouraged.     Stopping of antibiotics           encouraged.    ----------------------------     ------------------------------       PCT level decrease by              PCT >= 0.25 ng/mL       < 80% from peak PCT        AND PCT >= 0.5 ng/mL            Continuing antibiotics                                               encouraged.       Continuing antibiotics  encouraged.    ----------------------------     ------------------------------     PCT level increase compared          PCT > 0.5 ng/mL         with peak PCT AND          PCT >= 0.5 ng/mL             Escalation of antibiotics                                          strongly encouraged.      Escalation of antibiotics        strongly encouraged.   Technologist smear review     Status: None   Collection Time: 08/20/14  8:51 PM  Result Value Ref Range   Tech Review ELLIPTOCYTES     Comment: POLYCHROMASIA PRESENT MILD LEFT SHIFT (1-5% METAS, OCC MYELO, OCC BANDS)   HIV antibody     Status: None   Collection Time: 08/20/14 10:05 PM  Result Value Ref Range   HIV Screen 4th Generation wRfx Non Reactive Non Reactive    Comment: (NOTE) Performed At: [facility] [place], [place] [id number] Lindon Romp MD PJ:[id number]   CBC with Differential/Platelet     Status: Abnormal   Collection Time: 08/21/14  7:02 AM  Result Value Ref Range   WBC 19.4 (H) 4.0 - 10.5 K/uL   RBC 3.87 3.87 - 5.11 MIL/uL   Hemoglobin 11.2 (L) 12.0 - 15.0 g/dL   HCT 36.2 36.0 - 46.0 %   MCV 93.5 78.0 - 100.0 fL   MCH 28.9 26.0 - 34.0 pg   MCHC 30.9 30.0 - 36.0 g/dL   RDW 15.9 (H) 11.5 - 15.5 %   Platelets 100 (L) 150 - 400 K/uL    Comment: REPEATED TO VERIFY SPECIMEN CHECKED FOR CLOTS    Neutrophils Relative % 89 (H) 43 - 77 %    Neutro Abs 17.2 (H) 1.7 - 7.7 K/uL   Lymphocytes Relative 4 (L) 12 - 46 %   Lymphs Abs 0.8 0.7 - 4.0 K/uL   Monocytes Relative 7 3 - 12 %   Monocytes Absolute 1.3 (H) 0.1 - 1.0 K/uL   Eosinophils Relative 0 0 - 5 %   Eosinophils Absolute 0.0 0.0 - 0.7 K/uL   Basophils Relative 0 0 - 1 %   Basophils Absolute 0.0 0.0 - 0.1 K/uL  Basic metabolic panel     Status: Abnormal   Collection Time: 08/21/14  7:02 AM  Result Value Ref Range   Sodium 135 135 - 145 mmol/L   Potassium 5.5 (H) 3.5 - 5.1 mmol/L   Chloride 104 96 - 112 mmol/L   CO2 17 (L) 19 - 32 mmol/L   Glucose, Bld 83 70 - 99 mg/dL   BUN 41 (H) 6 - 23 mg/dL   Creatinine, Ser 2.67 (H) 0.50 - 1.10 mg/dL   Calcium 8.9 8.4 - 10.5 mg/dL   GFR calc non Af Amer 17 (L) >90 mL/min   GFR calc Af Amer 20 (L) >90 mL/min    Comment: (NOTE) The eGFR has been calculated using the CKD EPI equation. This calculation has not been validated in all clinical situations. eGFR's persistently <90 mL/min signify possible Chronic Kidney Disease.    Anion gap 14 5 - 15  Magnesium  Status: None   Collection Time: 08/21/14  7:02 AM  Result Value Ref Range   Magnesium 2.1 1.5 - 2.5 mg/dL  Protime-INR     Status: Abnormal   Collection Time: 08/21/14  7:02 AM  Result Value Ref Range   Prothrombin Time 37.0 (H) 11.6 - 15.2 seconds   INR 3.70 (H) 0.00 - 1.49  Strep pneumoniae urinary antigen     Status: Abnormal   Collection Time: 08/21/14  8:43 AM  Result Value Ref Range   Strep Pneumo Urinary Antigen POSITIVE (A) NEGATIVE  Legionella antigen, urine     Status: None   Collection Time: 08/21/14  8:43 AM  Result Value Ref Range   Specimen Description URINE, RANDOM    Special Requests NONE    Legionella Antigen, Urine      Negative for Legionella pneumophila serogroup 1                                                              Legionella pneumophila serogroup 1 antigen can be detected in urine within 2 to 3 days of infection and may persist even  after treatment. This  assay does not detect other Legionella species or serogroups. Performed at [facility]    Report Status 08/22/2014 FINAL   Culture, expectorated sputum-assessment     Status: None   Collection Time: 08/21/14  3:07 PM  Result Value Ref Range   Specimen Description SPUTUM    Special Requests Immunocompromised    Sputum evaluation      THIS SPECIMEN IS ACCEPTABLE. RESPIRATORY CULTURE REPORT TO FOLLOW.   Report Status 08/21/2014 FINAL   Culture, respiratory (NON-Expectorated)     Status: None (Preliminary result)   Collection Time: 08/21/14  3:07 PM  Result Value Ref Range   Specimen Description SPUTUM    Special Requests NONE    Gram Stain      MODERATE WBC PRESENT,BOTH PMN AND MONONUCLEAR MODERATE SQUAMOUS EPITHELIAL CELLS PRESENT MODERATE GRAM POSITIVE COCCI IN PAIRS FEW GRAM NEGATIVE RODS RARE GRAM POSITIVE RODS    Culture PENDING    Report Status PENDING   Basic metabolic panel     Status: Abnormal   Collection Time: 08/21/14  3:26 PM  Result Value Ref Range   Sodium 133 (L) 135 - 145 mmol/L   Potassium 5.2 (H) 3.5 - 5.1 mmol/L   Chloride 105 96 - 112 mmol/L   CO2 19 19 - 32 mmol/L   Glucose, Bld 137 (H) 70 - 99 mg/dL   BUN 45 (H) 6 - 23 mg/dL   Creatinine, Ser 2.78 (H) 0.50 - 1.10 mg/dL   Calcium 8.4 8.4 - 10.5 mg/dL   GFR calc non Af Amer 16 (L) >90 mL/min   GFR calc Af Amer 19 (L) >90 mL/min    Comment: (NOTE) The eGFR has been calculated using the CKD EPI equation. This calculation has not been validated in all clinical situations. eGFR's persistently <90 mL/min signify possible Chronic Kidney Disease.    Anion gap 9 5 - 15  Clostridium Difficile by PCR     Status: None   Collection Time: 08/22/14  4:52 AM  Result Value Ref Range   C difficile by pcr NEGATIVE NEGATIVE  Protime-INR     Status: Abnormal   Collection  Time: 08/22/14  5:45 AM  Result Value Ref Range   Prothrombin Time 44.6 (H) 11.6 - 15.2 seconds   INR 4.70 (H)  0.00 - 2.95  Basic metabolic panel     Status: Abnormal   Collection Time: 08/22/14  5:45 AM  Result Value Ref Range   Sodium 136 135 - 145 mmol/L   Potassium 4.1 3.5 - 5.1 mmol/L   Chloride 104 96 - 112 mmol/L   CO2 19 19 - 32 mmol/L   Glucose, Bld 99 70 - 99 mg/dL   BUN 48 (H) 6 - 23 mg/dL   Creatinine, Ser 2.88 (H) 0.50 - 1.10 mg/dL   Calcium 8.5 8.4 - 10.5 mg/dL   GFR calc non Af Amer 15 (L) >90 mL/min   GFR calc Af Amer 18 (L) >90 mL/min    Comment: (NOTE) The eGFR has been calculated using the CKD EPI equation. This calculation has not been validated in all clinical situations. eGFR's persistently <90 mL/min signify possible Chronic Kidney Disease.    Anion gap 13 5 - 15  Procalcitonin     Status: None   Collection Time: 08/22/14  5:45 AM  Result Value Ref Range   Procalcitonin 27.99 ng/mL    Comment:        Interpretation: PCT >= 10 ng/mL: Important systemic inflammatory response, almost exclusively due to severe bacterial sepsis or septic shock. (NOTE)         ICU PCT Algorithm               Non ICU PCT Algorithm    ----------------------------     ------------------------------         PCT < 0.25 ng/mL                 PCT < 0.1 ng/mL     Stopping of antibiotics            Stopping of antibiotics       strongly encouraged.               strongly encouraged.    ----------------------------     ------------------------------       PCT level decrease by               PCT < 0.25 ng/mL       >= 80% from peak PCT       OR PCT 0.25 - 0.5 ng/mL          Stopping of antibiotics                                             encouraged.     Stopping of antibiotics           encouraged.    ----------------------------     ------------------------------       PCT level decrease by              PCT >= 0.25 ng/mL       < 80% from peak PCT        AND PCT >= 0.5 ng/mL             Continuing antibiotics  encouraged.       Continuing  antibiotics            encouraged.    ----------------------------     ------------------------------     PCT level increase compared          PCT > 0.5 ng/mL         with peak PCT AND          PCT >= 0.5 ng/mL             Escalation of antibiotics                                          strongly encouraged.      Escalation of antibiotics        strongly encouraged.   Magnesium     Status: None   Collection Time: 08/22/14  5:45 AM  Result Value Ref Range   Magnesium 2.1 1.5 - 2.5 mg/dL  CBC with Differential/Platelet     Status: Abnormal   Collection Time: 08/22/14  5:45 AM  Result Value Ref Range   WBC 17.0 (H) 4.0 - 10.5 K/uL   RBC 3.08 (L) 3.87 - 5.11 MIL/uL   Hemoglobin 9.0 (L) 12.0 - 15.0 g/dL    Comment: REPEATED TO VERIFY DELTA CHECK NOTED    HCT 28.5 (L) 36.0 - 46.0 %   MCV 92.5 78.0 - 100.0 fL   MCH 28.9 26.0 - 34.0 pg   MCHC 31.2 30.0 - 36.0 g/dL   RDW 15.8 (H) 11.5 - 15.5 %   Platelets 104 (L) 150 - 400 K/uL    Comment: REPEATED TO VERIFY CONSISTENT WITH PREVIOUS RESULT    Neutrophils Relative % 85 (H) 43 - 77 %   Neutro Abs 14.5 (H) 1.7 - 7.7 K/uL   Lymphocytes Relative 5 (L) 12 - 46 %   Lymphs Abs 0.9 0.7 - 4.0 K/uL   Monocytes Relative 9 3 - 12 %   Monocytes Absolute 1.5 (H) 0.1 - 1.0 K/uL   Eosinophils Relative 0 0 - 5 %   Eosinophils Absolute 0.1 0.0 - 0.7 K/uL   Basophils Relative 0 0 - 1 %   Basophils Absolute 0.0 0.0 - 0.1 K/uL   No results found.  Assessment:  1 Renal transplant with chronic renal dysfunction with possible AKI 2 Anemia and refuses blood products 3 PNA Plan: 1 Treat PNA 2 Supportive tx  Ahava Kissoon C 08/22/2014, 4:15 PM

## 2014-08-22 NOTE — Discharge Instructions (Addendum)
Please take coumadin 1.32m for now on a daily basis instead of doing 2.5 and 1.25 alternatively.  Ask Dr. Deterding's office to check your INR in 1 week. You don't nee to check in tomorrow.  Take lasix 477mdaily for now until you see Dr. DeJimmy Footman  Take the abx for 3 more days (ceftin).   Information on my medicine - Coumadin   (Warfarin)  This medication education was reviewed with me or my healthcare representative as part of my discharge preparation.  The pharmacist that spoke with me during my hospital stay was:  RoWayland SalinasRPWind[facility] you? Coumadin was prescribed for you because you have a blood clot or a medical condition that can cause an increased risk of forming blood clots. Blood clots can cause serious health problems by blocking the flow of blood to the heart, lung, or brain. Coumadin can prevent harmful blood clots from forming. As a reminder your indication for Coumadin is:   Stroke Prevention Because Of Atrial Fibrillation  What test will check on my response to Coumadin? While on Coumadin (warfarin) you will need to have an INR test regularly to ensure that your dose is keeping you in the desired range. The INR (international normalized ratio) number is calculated from the result of the laboratory test called prothrombin time (PT).  If an INR APPOINTMENT HAS NOT ALREADY BEEN MADE FOR YOU please schedule an appointment to have this lab work done by your health care provider within 7 days. Your INR goal is usually a number between:  2 to 3 or your provider may give you a more narrow range like 2-2.5.  Ask your health care provider during an office visit what your goal INR is.  What  do you need to  know  About  COUMADIN? Take Coumadin (warfarin) exactly as prescribed by your healthcare provider about the same time each day.  DO NOT stop taking without talking to the doctor who prescribed the medication.  Stopping without other  blood clot prevention medication to take the place of Coumadin may increase your risk of developing a new clot or stroke.  Get refills before you run out.  What do you do if you miss a dose? If you miss a dose, take it as soon as you remember on the same day then continue your regularly scheduled regimen the next day.  Do not take two doses of Coumadin at the same time.  Important Safety Information A possible side effect of Coumadin (Warfarin) is an increased risk of bleeding. You should call your healthcare provider right away if you experience any of the following: ? Bleeding from an injury or your nose that does not stop. ? Unusual colored urine (red or dark brown) or unusual colored stools (red or black). ? Unusual bruising for unknown reasons. ? A serious fall or if you hit your head (even if there is no bleeding).  Some foods or medicines interact with Coumadin (warfarin) and might alter your response to warfarin. To help avoid this: ? Eat a balanced diet, maintaining a consistent amount of Vitamin K. ? Notify your provider about major diet changes you plan to make. ? Avoid alcohol or limit your intake to 1 drink for women and 2 drinks for men per day. (1 drink is 5 oz. wine, 12 oz. beer, or 1.5 oz. liquor.)  Make sure that ANY health care provider who prescribes medication for you knows that you are taking  Coumadin (warfarin).  Also make sure the healthcare provider who is monitoring your Coumadin knows when you have started a new medication including herbals and non-prescription products.  Coumadin (Warfarin)  Major Drug Interactions  Increased Warfarin Effect Decreased Warfarin Effect  Alcohol (large quantities) Antibiotics (esp. Septra/Bactrim, Flagyl, Cipro) Amiodarone (Cordarone) Aspirin (ASA) Cimetidine (Tagamet) Megestrol (Megace) NSAIDs (ibuprofen, naproxen, etc.) Piroxicam (Feldene) Propafenone (Rythmol SR) Propranolol (Inderal) Isoniazid (INH) Posaconazole  (Noxafil) Barbiturates (Phenobarbital) Carbamazepine (Tegretol) Chlordiazepoxide (Librium) Cholestyramine (Questran) Griseofulvin Oral Contraceptives Rifampin Sucralfate (Carafate) Vitamin K   Coumadin (Warfarin) Major Herbal Interactions  Increased Warfarin Effect Decreased Warfarin Effect  Garlic Ginseng Ginkgo biloba Coenzyme Q10 Green tea St. Johns wort    Coumadin (Warfarin) FOOD Interactions  Eat a consistent number of servings per week of foods HIGH in Vitamin K (1 serving =  cup)  Collards (cooked, or boiled & drained) Kale (cooked, or boiled & drained) Mustard greens (cooked, or boiled & drained) Parsley *serving size only =  cup Spinach (cooked, or boiled & drained) Swiss chard (cooked, or boiled & drained) Turnip greens (cooked, or boiled & drained)  Eat a consistent number of servings per week of foods MEDIUM-HIGH in Vitamin K (1 serving = 1 cup)  Asparagus (cooked, or boiled & drained) Broccoli (cooked, boiled & drained, or raw & chopped) Brussel sprouts (cooked, or boiled & drained) *serving size only =  cup Lettuce, raw (green leaf, endive, romaine) Spinach, raw Turnip greens, raw & chopped   These websites have more information on Coumadin (warfarin):  FailFactory.se; VeganReport.com.au;

## 2014-08-22 NOTE — Progress Notes (Signed)
Internal Medicine Attending  Date: 08/22/2014  Patient name: [name] Medical record number: [id number] Date of birth: 1940/10/17 Age: 74 y.o. Gender: female  I saw and evaluated the patient, and discussed her care with resident on A.M rounds.  I reviewed the resident's note by Dr. Genene Churn and I agree with the resident's findings and plans as documented in his note.  Dr. Ellwood Dense will cover as attending physician this weekend, and Dr. Beryle Beams will take over on Monday 08/25/2014.

## 2014-08-22 NOTE — Progress Notes (Signed)
[facility]    Subjective: No new complaints, feels better   Antibiotics:  Anti-infectives    Start     Dose/Rate Route Frequency Ordered Stop   08/21/14 1800  cefTRIAXone (ROCEPHIN) 1 g in dextrose 5 % 50 mL IVPB - Premix  Status:  Discontinued     1 g 100 mL/hr over 30 Minutes Intravenous Every 24 hours 08/20/14 1838 08/21/14 1348   08/21/14 1800  cefTRIAXone (ROCEPHIN) 2 g in dextrose 5 % 50 mL IVPB - Premix     2 g 100 mL/hr over 30 Minutes Intravenous Every 24 hours 08/21/14 1348     08/20/14 1545  cefTRIAXone (ROCEPHIN) 1 g in dextrose 5 % 50 mL IVPB     1 g 100 mL/hr over 30 Minutes Intravenous  Once 08/20/14 1532 08/20/14 1630      Medications: Scheduled Meds: . amiodarone  100 mg Oral BID  . antiseptic oral rinse  7 mL Mouth Rinse BID  . calcitRIOL  0.25 mcg Oral QODAY  . cefTRIAXone (ROCEPHIN)  IV  2 g Intravenous Q24H  . dextromethorphan-guaiFENesin  1 tablet Oral BID  . feeding supplement (ENSURE ENLIVE)  237 mL Oral BID BM  . fluticasone  2 spray Each Nare Daily  . gabapentin  200 mg Oral QHS  . levothyroxine  100 mcg Oral QAC breakfast  . loratadine  10 mg Oral Daily  . metoprolol  100 mg Oral BID  . mycophenolate  360 mg Oral BID  . pantoprazole  40 mg Oral Daily  . predniSONE  10 mg Oral Daily  . tacrolimus  4 mg Oral QPM  . tacrolimus  5 mg Oral q morning - 10a  . Warfarin - Pharmacist Dosing Inpatient   Does not apply q1800   Continuous Infusions: . sodium chloride 100 mL/hr at 08/22/14 1711   PRN Meds:.acetaminophen, benzonatate, HYDROcodone-acetaminophen, ipratropium-albuterol, ondansetron (ZOFRAN) IV    Objective: Weight change: 2 lb 3.2 oz (0.998 kg)  Intake/Output Summary (Last 24 hours) at 08/22/14 2118 Last data filed at 08/22/14 2116  Gross per 24 hour  Intake   1860 ml  Output   1200 ml  Net    660 ml   Blood pressure 130/62, pulse 73, temperature 97.6 F (36.4 C), temperature source Oral, resp.  rate 18, height 5' 3"  (1.6 m), weight 135 lb 14.4 oz (61.644 kg), last menstrual period 03/05/2014, SpO2 95 %. Temp:  [97.6 F (36.4 C)-98.7 F (37.1 C)] 97.6 F (36.4 C) (04/15 1432) Pulse Rate:  [58-81] 73 (04/15 0943) Resp:  [18] 18 (04/15 0609) BP: (121-130)/(53-62) 130/62 mmHg (04/15 1432) SpO2:  [89 %-98 %] 95 % (04/15 1446) Weight:  [135 lb 14.4 oz (61.644 kg)] 135 lb 14.4 oz (61.644 kg) (04/15 5284)  Physical Exam: General: Alert and awake, oriented x3, not in any acute distress. HEENT: anicteric sclera, pupils reactive to light and accommodation, EOMI, oropharynx clear and without exudate CVS regular rate, normal r, no murmur rubs or gallops Chest: Diminished breath sounds in the right base with some rhonchii Abdomen: soft nontender, nondistended, normal bowel sounds, Extremities: no clubbing or edema noted bilaterally Skin: no rashes Neuro: nonfocal, strength and sensation intact  CBC:  CBC Latest Ref Rng 08/22/2014 08/21/2014 08/20/2014  WBC 4.0 - 10.5 K/uL 17.0(H) 19.4(H) -  Hemoglobin 12.0 - 15.0 g/dL 9.0(L) 11.2(L) 12.6  Hematocrit 36.0 - 46.0 % 28.5(L) 36.2 37.0  Platelets 150 - 400 K/uL 104(L) 100(L) -  BMET  Recent Labs  08/21/14 1526 08/22/14 0545  NA 133* 136  K 5.2* 4.1  CL 105 104  CO2 19 19  GLUCOSE 137* 99  BUN 45* 48*  CREATININE 2.78* 2.88*  CALCIUM 8.4 8.5     Liver Panel   Recent Labs  08/20/14 2051  PROT 6.0  ALBUMIN 3.1*  AST 26  ALT 20  ALKPHOS 94  BILITOT 0.7  BILIDIR 0.3  IBILI 0.4       Sedimentation Rate No results for input(s): ESRSEDRATE in the last 72 hours. C-Reactive Protein No results for input(s): CRP in the last 72 hours.  Micro Results: Recent Results (from the past 720 hour(s))  Blood culture (routine x 2)     Status: None (Preliminary result)   Collection Time: 08/20/14  2:34 PM  Result Value Ref Range Status   Specimen Description BLOOD RIGHT HAND  Final   Special Requests BOTTLES DRAWN  AEROBIC ONLY 2CC  Final   Culture   Final           BLOOD CULTURE RECEIVED NO GROWTH TO DATE CULTURE WILL BE HELD FOR 5 DAYS BEFORE ISSUING A FINAL NEGATIVE REPORT Performed at [facility]    Report Status PENDING  Incomplete  Blood culture (routine x 2)     Status: None (Preliminary result)   Collection Time: 08/20/14  3:40 PM  Result Value Ref Range Status   Specimen Description BLOOD HAND LEFT  Final   Special Requests   Final    BOTTLES DRAWN AEROBIC AND ANAEROBIC 10CCBLUE 5CCRED   Culture   Final           BLOOD CULTURE RECEIVED NO GROWTH TO DATE CULTURE WILL BE HELD FOR 5 DAYS BEFORE ISSUING A FINAL NEGATIVE REPORT Performed at [facility]    Report Status PENDING  Incomplete  MRSA PCR Screening     Status: None   Collection Time: 08/20/14  8:45 PM  Result Value Ref Range Status   MRSA by PCR NEGATIVE NEGATIVE Final    Comment:        The GeneXpert MRSA Assay (FDA approved for NASAL specimens only), is one component of a comprehensive MRSA colonization surveillance program. It is not intended to diagnose MRSA infection nor to guide or monitor treatment for MRSA infections.   Culture, expectorated sputum-assessment     Status: None   Collection Time: 08/21/14  3:07 PM  Result Value Ref Range Status   Specimen Description SPUTUM  Final   Special Requests Immunocompromised  Final   Sputum evaluation   Final    THIS SPECIMEN IS ACCEPTABLE. RESPIRATORY CULTURE REPORT TO FOLLOW.   Report Status 08/21/2014 FINAL  Final  Culture, respiratory (NON-Expectorated)     Status: None (Preliminary result)   Collection Time: 08/21/14  3:07 PM  Result Value Ref Range Status   Specimen Description SPUTUM  Final   Special Requests NONE  Final   Gram Stain   Final    MODERATE WBC PRESENT,BOTH PMN AND MONONUCLEAR MODERATE SQUAMOUS EPITHELIAL CELLS PRESENT MODERATE GRAM POSITIVE COCCI IN PAIRS FEW GRAM NEGATIVE RODS RARE GRAM POSITIVE RODS    Culture PENDING   Incomplete   Report Status PENDING  Incomplete  Clostridium Difficile by PCR     Status: None   Collection Time: 08/22/14  4:52 AM  Result Value Ref Range Status   C difficile by pcr NEGATIVE NEGATIVE Final    Studies/Results: No results found.    Assessment/Plan:  Principal Problem:   Streptococcal pneumonia Active Problems:   Atrial fibrillation   CKD (chronic kidney disease), stage IV   History of renal transplant   HTN (hypertension)   Hypothyroidism   Crohn's disease   Allergic rhinitis   History of DVT (deep vein thrombosis)   GERD (gastroesophageal reflux disease)   SLE (systemic lupus erythematosus)   OSA (obstructive sleep apnea)   Immunosuppressed status   Sepsis   Anemia of chronic disease   Syncope    [name] is a 74 y.o. female with with Renal transplant patient with RLL pneumonia, sepsis, syncopal event with apparent pneumococcal PNA (Pneumococcal ag is + in urine)  #1 Pneumococcal PNA with sepis syncope --continue Rocephin to 2g daily in case she is also bacteremic Make sure that she gets at least 5 days of therapy, (she is on day #3 already)  #2 leukocytosis: Could be due to increase of her steroid dose  #3 loose stools her story is not at all consistent with C. Difficile colitis and she is also taking magnesium oxide which is known to cause diarrhea. C diff pCR negative  #4r HIV negative  and HCV pending  I will sign off for now  Please call with further questions   LOS: 2 days   Alcide Evener 08/22/2014, 9:18 PM

## 2014-08-22 NOTE — Discharge Summary (Signed)
Name: [name] MRN: [id number] DOB: [birth year] 74 y.o. PCP: Mauricia Area, MD  Date of Admission: 08/20/2014  1:40 PM Date of Discharge: 08/26/2014 Attending Physician: Annia Belt, MD  Discharge Diagnosis: Principal Problem:   Streptococcal pneumonia Active Problems:   Atrial fibrillation   CKD (chronic kidney disease), stage IV   History of renal transplant   HTN (hypertension)   Hypothyroidism   Crohn's disease   Allergic rhinitis   History of DVT (deep vein thrombosis)   GERD (gastroesophageal reflux disease)   SLE (systemic lupus erythematosus)   OSA (obstructive sleep apnea)   Immunosuppressed status   Sepsis   Anemia of chronic disease   Syncope   Community acquired pneumonia   Pleural effusion, left  Discharge Medications:   Medication List    TAKE these medications        acetaminophen 325 MG tablet  Commonly known as:  TYLENOL  Take 650 mg by mouth every 6 (six) hours as needed for mild pain.     amiodarone 200 MG tablet  Commonly known as:  PACERONE  Take 100 mg by mouth 2 (two) times daily.     calcitRIOL 0.25 MCG capsule  Commonly known as:  ROCALTROL  Take 0.25 mcg by mouth every other day.     cefUROXime 500 MG tablet  Commonly known as:  CEFTIN  Take 1 tablet (500 mg total) by mouth daily.     dextromethorphan-guaiFENesin 30-600 MG per 12 hr tablet  Commonly known as:  MUCINEX DM  Take 1 tablet by mouth 2 (two) times daily.     epoetin alfa 40000 UNIT/ML injection  Commonly known as:  EPOGEN,PROCRIT  Inject 40,000 Units into the skin every 21 ( twenty-one) days. Due Thursday, 08/21/14     feeding supplement (ENSURE ENLIVE) Liqd  Take 237 mLs by mouth 2 (two) times daily between meals.     fluticasone 50 MCG/ACT nasal spray  Commonly known as:  FLONASE  Place 2 sprays into both nostrils daily.     furosemide 40 MG tablet  Commonly known as:  LASIX  Take 1 tablet (40 mg total) by mouth daily.     gabapentin 100 MG  capsule  Commonly known as:  NEURONTIN  Take 200 mg by mouth at bedtime.     levothyroxine 100 MCG tablet  Commonly known as:  SYNTHROID, LEVOTHROID  Take 100 mcg by mouth daily.     MAGNESIUM-OXIDE 400 (241.3 MG) MG tablet  Generic drug:  magnesium oxide  Take 400 mg by mouth 2 (two) times daily.     metoprolol 100 MG tablet  Commonly known as:  LOPRESSOR  Take 100 mg by mouth 2 (two) times daily.     mycophenolate 180 MG EC tablet  Commonly known as:  MYFORTIC  Take 360 mg by mouth 2 (two) times daily.     omeprazole 20 MG capsule  Commonly known as:  PRILOSEC  Take 20 mg by mouth daily.     polyethylene glycol packet  Commonly known as:  MIRALAX / GLYCOLAX  Take 17 g by mouth 2 (two) times daily as needed. For constipation     polyvinyl alcohol-povidone 1.4-0.6 % ophthalmic solution  Commonly known as:  HYPOTEARS  Place 1-2 drops into both eyes 4 (four) times daily as needed (dry eyes).     predniSONE 5 MG tablet  Commonly known as:  DELTASONE  Take 5 mg by mouth daily.     tacrolimus 5 MG  capsule  Commonly known as:  PROGRAF  Take 5 mg by mouth every morning.     tacrolimus 1 MG capsule  Commonly known as:  PROGRAF  Take 4 mg by mouth every evening.     warfarin 2.5 MG tablet  Commonly known as:  COUMADIN  Take 0.5 tablets (1.25 mg total) by mouth one time only at 6 PM.        Disposition and follow-up:   [name] was discharged from [facility] in Stable condition.  At the hospital follow up visit please address:  1.  Please assess her respiratory status. Please make sure to follow her BMET as we started lasix po 17m daily for recent volume overload.  Please make sure her INR is followed up outpatient. Asked he to take 1.23mcoumadin daily for now and to check INR in 1 week. Her INR was 4.7 on admission and we held coumadin in the hospital. On discharge in 2.13.   2.  Labs / imaging needed at time of follow-up: BMET,  INR  3.  Pending labs/ test needing follow-up: bcx, sputum cx.   Follow-up Appointments: Follow-up Information    Follow up with [facility]  Why:  hhpt   Contact information:   [address] [phone]     Follow up with DETERDING,[name], MD On 09/16/2014.   Specialty:  Nephrology   Why:  09/16/14 at 2:00pm   Contact information:   [id number] [phone]     Discharge Instructions:   Consultations: Treatment Team:  AlEstanislado EmmsMD  Procedures Performed:  Dg Chest 2 View  08/26/2014   CLINICAL DATA:  Dyspnea shortness of breath recent pneumonia possible over hydrated, chronic renal insufficiency with renal transplantation.  EXAM: CHEST  2 VIEW  COMPARISON:  PA and lateral chest x-ray of August 25, 2014  FINDINGS: The right lung is well-expanded. On the left retrocardiac density persists in the hemidiaphragm remains obscured. A small left pleural effusion is present. The pulmonary interstitial markings are mildly increased but have improved slightly since yesterday's study. The cardiac silhouette and pulmonary vascularity are normal. The bony thorax exhibits no acute abnormality.  IMPRESSION: Persistent left lower lobe atelectasis or pneumonia. There is also mild pulmonary interstitial edema which has improved somewhat since yesterday's study.   Electronically Signed   By: David  JoMartinique On: 08/26/2014 07:58   Dg Chest 2 View  08/25/2014   CLINICAL DATA:  Several day history of shortness of breath  EXAM: CHEST  2 VIEW  COMPARISON:  Chest x-ray of August 24, 2014 and August 20, 2014  FINDINGS: The lungs are reasonably well inflated. The interstitial markings remain increased at both lung bases. Confluent density in the left lower lobe persists. Trace amounts of pleural fluid are present. The cardiac silhouette is top-normal in size. The pulmonary vascularity is not engorged. The mediastinum is normal in  width. The bony thorax exhibits no acute abnormality.  IMPRESSION: COPD with bibasilar atelectasis or pneumonia. There has been slight improvement on the right since the previous studies, but the findings on the left are more conspicuous.   Electronically Signed   By: David  JoMartinique On: 08/25/2014 07:59   Dg Chest 2 View  08/24/2014   CLINICAL DATA:  Pt complains of mid-sternal chest pain, SOB, and loss of appetite for 2 weeks; pt states she has  HTN; non-smoker  EXAM: CHEST  2 VIEW  COMPARISON:  08/20/2014  FINDINGS: Since the prior exam, opacity has developed at the left lung base obscuring the left hemidiaphragm on the frontal view. There is also some increased interstitial and patchy airspace opacity at the right lung base and in a perihilar distribution on the right. Minimal right pleural effusion.  Lungs are mildly hyperexpanded.  No evidence of pulmonary edema.  Cardiac silhouette is mildly enlarged. No mediastinal or hilar masses or convincing adenopathy.  IMPRESSION: 1. Worsened lung aeration. 2. New opacity has developed a follow-up lung base, likely pneumonia. Patchy areas of opacity in the right perihilar and lower lung zone have increased, also likely due to infection.   Electronically Signed   By: Lajean Manes M.D.   On: 08/24/2014 08:40   Dg Chest 2 View  08/20/2014   CLINICAL DATA:  Fever, cough, lower RIGHT chest pain during coughing for 2 days, history hypertension, DVT, Crohn's disease, lupus, renal cell carcinoma  EXAM: CHEST  2 VIEW  COMPARISON:  05/29/2013  FINDINGS: Enlargement of cardiac silhouette.  Calcified tortuous aorta.  Pulmonary vascularity normal.  RIGHT lower lobe infiltrate consistent with pneumonia.  Mild central peribronchial thickening.  Minimal atelectasis at lung bases and scattered thickening of the fissures.  Upper lungs clear.  No definite pleural effusion or pneumothorax.  Numerous surgical clips and IVC filter noted in upper abdomen.  Bones demineralized.   IMPRESSION: RIGHT lower lobe pneumonia.  Enlargement of cardiac silhouette.  Bibasilar atelectasis.   Electronically Signed   By: Lavonia Dana M.D.   On: 08/20/2014 15:21    Admission HPI:   74 yo female with hx of HTN, DVT coumadin, ESRD was on HD for SLE, but then had kidney tx on immunosuppression, comes in with fever, cough, malaise for 1-2 days. Reported fever 101 this morning, coughing up yellow sputum, has been feeling weak for 2 days. Has runny nose 1-2 days. Also having diarrhea, nausea, vomiting. Having rib pain from coughing severely. Also having some muscle ache. Did receive flut shot. No sick contacts. Denies any other problems. Has been following with Dr. Jimmy Footman for kidney transplant. No issues. Was doing well prior to last 2 days.   Follows with Dr. Percell Miller GI for hx of crohn's. No recent flares. Not on meds for it. Has hx of allergic rxn to many abx.stated vanc and gent was ok in the past.   Hospital Course by problem list:  73 yo female with kidney transplant CKD IV, on immunosuppresion, here with strep pneum pneumonia.  Strep pneum pneumonia - + strep pneum urine antigen and also gram strain showing GPC in pairs -   leukocytosis improved (elevated b/c of prednisone at baseline), CXR showed RLL infiltrate initially but now showed improvement of that. However, shows left basilar effusion, likely 2/2 to volume overload - it has improved in serial CXR's with diuresis. She was initially volume depleted as she came in with low PO intake and illness. Given she has CKD 4, she had idiopathic volume overload from IVF admin. - was on lasix 13m q8hr here per nephro recs. Will d/c home with lasix 422mdaily.will cont this until sees Dr. DeJimmy Footmanor f/up.  - received 7 days of ceftriaxone. Will send home 3 more days of ceftin for total 10 days.  - need to f/up sputum cx (GPC in pairs), blood cx ngtd. - was hypoxic initially but now satting fine on room air now, even with  ambulation.  Acute hypoxic resp failure -resolved. likely 2/2 to volume overload as discussed above.  - CXR showed improvement of of LLL effusion with diruesis.  - cont lasix 23m po daily for now on discharge.  AKI?on CKD IV -bl gfr around ~20-24. Has hx of renal cell carcinoma s/p bilat nephrectomies, also hx of HD in the past, s/p kidney transplant 2006. Chronic immunosuppresion due to this. GFR now at baseline - kidney function worsened initially was likely 2/2 to dehydration, which we corrected with fluid. It's stable currently. - currently kidney function stable. - appreciate nephro recs. needs close nephrology follow up with Dr. DJimmy Footman - cont prograf 5 mg morning, 4 mg evening. - received prednisone 128mdaily for current illness. On 5 mg normally at home. will switch back to home regimen on discharge. - cont mycophenolate.  Lonstanding PAF and hx of DVT - on chronic coumadin - cont amio 5086mnd metoprolol. Echo 2/14 [facility] mild to moderate AR normal EF  - INR was 4.7 even after holding coumadin last few days but now improved. This could be 2/2 to ceftriaxone potentiation of coumadin.  - INR today 2.13. Will send out with 1.79m85mumadin daily ( was taking 2.5 and 1.79mg68mernatively). Asked her to check INR in 1 week.   HTN - cont metoprolol.  Chronic normocytic anemia -- of chronic disease. b/l hgb around 10.  - stable. Monitor. Gets Epo injections every 21 days. Gave Aranesp in hospital 08/22/14.   hypomag - cont mag ox Hypothyroidism - cont levothyroxine GERD - cont ppi.  Discharge Vitals:   BP 128/57 mmHg  Pulse 72  Temp(Src) 98.2 F (36.8 C) (Oral)  Resp 20  Ht 5' 3"  (1.6 m)  Wt 134 lb 7.7 oz (61 kg)  BMI 23.83 kg/m2  SpO2 96%  LMP 03/05/2014  Discharge Labs:  Results for orders placed or performed during the hospital encounter of 08/20/14 (from the past 24 hour(s))  Protime-INR     Status: Abnormal   Collection Time: 08/26/14  7:44 AM  Result  Value Ref Range   Prothrombin Time 24.0 (H) 11.6 - 15.2 seconds   INR 2.13 (H) 0.00 - 1.49  Procalcitonin     Status: None   Collection Time: 08/26/14  7:44 AM  Result Value Ref Range   Procalcitonin 2.68 ng/mL  CBC     Status: Abnormal   Collection Time: 08/26/14  7:44 AM  Result Value Ref Range   WBC 13.8 (H) 4.0 - 10.5 K/uL   RBC 3.32 (L) 3.87 - 5.11 MIL/uL   Hemoglobin 9.3 (L) 12.0 - 15.0 g/dL   HCT 30.3 (L) 36.0 - 46.0 %   MCV 91.3 78.0 - 100.0 fL   MCH 28.0 26.0 - 34.0 pg   MCHC 30.7 30.0 - 36.0 g/dL   RDW 15.7 (H) 11.5 - 15.5 %   Platelets 181 150 - 400 K/uL  Renal function panel     Status: Abnormal   Collection Time: 08/26/14  7:47 AM  Result Value Ref Range   Sodium 136 135 - 145 mmol/L   Potassium 4.1 3.5 - 5.1 mmol/L   Chloride 99 96 - 112 mmol/L   CO2 25 19 - 32 mmol/L   Glucose, Bld 94 70 - 99 mg/dL   BUN 49 (H) 6 - 23 mg/dL   Creatinine, Ser 2.89 (H) 0.50 - 1.10 mg/dL   Calcium 9.3 8.4 - 10.5 mg/dL   Phosphorus 3.0 2.3 - 4.6 mg/dL   Albumin 3.2 (L)  3.5 - 5.2 g/dL   GFR calc non Af Amer 15 (L) >90 mL/min   GFR calc Af Amer 18 (L) >90 mL/min   Anion gap 12 5 - 15    Signed: Dellia Nims, MD 08/26/2014, 3:06 PM    Services Ordered on Discharge: home pt Equipment Ordered on Discharge:

## 2014-08-22 NOTE — Progress Notes (Addendum)
Subjective:  Afebrile overnight. Coughing some but better, breathing is better. No complaints. Did see some orange urine upon questioning.   Objective: Vital signs in last 24 hours: Filed Vitals:   08/21/14 2231 08/21/14 2231 08/22/14 0609 08/22/14 0639  BP: 128/53 128/53 126/58   Pulse: 81 79 58   Temp:  98.7 F (37.1 C) 97.7 F (36.5 C)   TempSrc:  Oral Oral   Resp: 18 18 18    Height:      Weight:    135 lb 14.4 oz (61.644 kg)  SpO2: 95% 93% 98%    Weight change: 2 lb 3.2 oz (0.998 kg)  Intake/Output Summary (Last 24 hours) at 08/22/14 0740 Last data filed at 08/22/14 0429  Gross per 24 hour  Intake 1801.67 ml  Output   1250 ml  Net 551.67 ml   Vitals reviewed. General: resting in bed, NAD HEENT: PERRL, EOMI, no scleral icterus Cardiac: RRR, no rubs, murmurs or gallops Pulm: mild crackles on bases. No wheezing. Abd: soft, nontender, nondistended, BS present, surgical hernia on RLQ. Ext: warm and well perfused, no pedal edema Neuro: alert and oriented X3, cranial nerves II-XII grossly intact, strength and sensation to light touch equal in bilateral upper and lower extremities  Lab Results: Basic Metabolic Panel:  Recent Labs Lab 08/20/14 2051 08/21/14 0702 08/21/14 1526 08/22/14 0545  NA  --  135 133* 136  K  --  5.5* 5.2* 4.1  CL  --  104 105 104  CO2  --  17* 19 19  GLUCOSE  --  83 137* 99  BUN  --  41* 45* 48*  CREATININE  --  2.67* 2.78* 2.88*  CALCIUM  --  8.9 8.4 8.5  MG 1.4* 2.1  --  2.1  PHOS 3.1  --   --   --    Liver Function Tests:  Recent Labs Lab 08/20/14 2051  AST 26  ALT 20  ALKPHOS 94  BILITOT 0.7  PROT 6.0  ALBUMIN 3.1*   No results for input(s): LIPASE, AMYLASE in the last 168 hours. No results for input(s): AMMONIA in the last 168 hours. CBC:  Recent Labs Lab 08/21/14 0702 08/22/14 0545  WBC 19.4* 17.0*  NEUTROABS 17.2* 14.5*  HGB 11.2* 9.0*  HCT 36.2 28.5*  MCV 93.5 92.5  PLT 100* 104*  Coagulation:  Recent  Labs Lab 08/20/14 1536 08/21/14 0702 08/22/14 0545  LABPROT 34.1* 37.0* 44.6*  INR 3.34* 3.70* 4.70*   Urinalysis:  Recent Labs Lab 08/20/14 1650  COLORURINE YELLOW  LABSPEC 1.021  PHURINE 5.0  GLUCOSEU NEGATIVE  HGBUR NEGATIVE  BILIRUBINUR NEGATIVE  KETONESUR NEGATIVE  PROTEINUR NEGATIVE  UROBILINOGEN 0.2  NITRITE NEGATIVE  LEUKOCYTESUR NEGATIVE   Micro Results: Recent Results (from the past 240 hour(s))  Blood culture (routine x 2)     Status: None (Preliminary result)   Collection Time: 08/20/14  2:34 PM  Result Value Ref Range Status   Specimen Description BLOOD RIGHT HAND  Final   Special Requests BOTTLES DRAWN AEROBIC ONLY 2CC  Final   Culture   Final           BLOOD CULTURE RECEIVED NO GROWTH TO DATE CULTURE WILL BE HELD FOR 5 DAYS BEFORE ISSUING A FINAL NEGATIVE REPORT Performed at [facility]    Report Status PENDING  Incomplete  MRSA PCR Screening     Status: None   Collection Time: 08/20/14  8:45 PM  Result Value Ref Range Status  MRSA by PCR NEGATIVE NEGATIVE Final    Comment:        The GeneXpert MRSA Assay (FDA approved for NASAL specimens only), is one component of a comprehensive MRSA colonization surveillance program. It is not intended to diagnose MRSA infection nor to guide or monitor treatment for MRSA infections.   Culture, expectorated sputum-assessment     Status: None   Collection Time: 08/21/14  3:07 PM  Result Value Ref Range Status   Specimen Description SPUTUM  Final   Special Requests Immunocompromised  Final   Sputum evaluation   Final    THIS SPECIMEN IS ACCEPTABLE. RESPIRATORY CULTURE REPORT TO FOLLOW.   Report Status 08/21/2014 FINAL  Final   Studies/Results: Dg Chest 2 View  08/20/2014   CLINICAL DATA:  Fever, cough, lower RIGHT chest pain during coughing for 2 days, history hypertension, DVT, Crohn's disease, lupus, renal cell carcinoma  EXAM: CHEST  2 VIEW  COMPARISON:  05/29/2013  FINDINGS: Enlargement of  cardiac silhouette.  Calcified tortuous aorta.  Pulmonary vascularity normal.  RIGHT lower lobe infiltrate consistent with pneumonia.  Mild central peribronchial thickening.  Minimal atelectasis at lung bases and scattered thickening of the fissures.  Upper lungs clear.  No definite pleural effusion or pneumothorax.  Numerous surgical clips and IVC filter noted in upper abdomen.  Bones demineralized.  IMPRESSION: RIGHT lower lobe pneumonia.  Enlargement of cardiac silhouette.  Bibasilar atelectasis.   Electronically Signed   By: Lavonia Dana M.D.   On: 08/20/2014 15:21   Medications: I have reviewed the patient's current medications. Scheduled Meds: . amiodarone  100 mg Oral BID  . antiseptic oral rinse  7 mL Mouth Rinse BID  . calcitRIOL  0.25 mcg Oral QODAY  . cefTRIAXone (ROCEPHIN)  IV  2 g Intravenous Q24H  . dextromethorphan-guaiFENesin  1 tablet Oral BID  . feeding supplement (ENSURE ENLIVE)  237 mL Oral BID BM  . fluticasone  2 spray Each Nare Daily  . gabapentin  200 mg Oral QHS  . ipratropium-albuterol  3 mL Nebulization TID  . levothyroxine  100 mcg Oral QAC breakfast  . loratadine  10 mg Oral Daily  . metoprolol  100 mg Oral BID  . mycophenolate  360 mg Oral BID  . pantoprazole  40 mg Oral Daily  . predniSONE  10 mg Oral Daily  . tacrolimus  4 mg Oral QPM  . tacrolimus  5 mg Oral q morning - 10a  . Warfarin - Pharmacist Dosing Inpatient   Does not apply q1800   Continuous Infusions: . sodium chloride 100 mL/hr at 08/22/14 0725   PRN Meds:.acetaminophen, albuterol, benzonatate, HYDROcodone-acetaminophen, ondansetron (ZOFRAN) IV Assessment/Plan: Principal Problem:   Streptococcal pneumonia Active Problems:   Atrial fibrillation   CKD (chronic kidney disease), stage IV   History of renal transplant   HTN (hypertension)   Hypothyroidism   Crohn's disease   Allergic rhinitis   History of DVT (deep vein thrombosis)   GERD (gastroesophageal reflux disease)   SLE (systemic  lupus erythematosus)   OSA (obstructive sleep apnea)   Immunosuppressed status   Sepsis   Anemia of chronic disease   Syncope  74 yo female with kidney transplant CKD IV, on immunosuppresion, here with CAP.  CAP positive for Strep pneum urine antigen - has leukocytosis (going up to 14.7 > 19.4 but now 17) , CXR showing RLL infiltrate. Patient is complicated given she is on immunosuppression and also has allergies to lot of antibiotics. Afebrile now, was febrile  upto Tmax 103 2 nights ago. - continue ceftriaxone for now. Missing HCAP coverage (not sure if would meet definition of HCAP based on just her renal transplant hx). Could do Vanc and aztreonam for broaden coverage if worsens. - consulted ID for their advice. Agreed with cont ceftriaxone. - will likely switch to ceftin on discharge for total 7 days treatment. - f/up sputum cx, blood cux, legionella. - o2 sat to keep sat >92%.  - cont duoneb to see if it helps. - tessalon.   HyperK+ 5.5 yesterday - could be from worsening kidney function - treated with kayex. Improved.  - monitor Bmet.  AKI?on CKD IV - bl gfr around ~20-24. Came in 70, now around 35. was on HD in the past, s/p kidney transplant  - CRT and GFR worsening somehwat (GFR now 17). BUN also going up. Likely pre-renal or worsening in the setting of infection. Increase fluid to 150cc/hr.  - consulted nephrology for the AKI. F/up their rcs. - recheck BMET  - needs close nephrology follow up with Dr. Jimmy Footman. - cont prograf 5 mg morning, 4 mg evening. - cont prednisone 57m daily (5 mg normally, for recent illness) - cont mycophenolate. - no hematuria or other urinary symptoms, just stated having some orange urine. Will check CK to ruleout rhabdo.   Lonstanding PAF and hx of DVT - on chronic coumadin - cont amio 535mand metoprolol. Echo 2/14 [place] mild to moderate AR normal EF  - INR remains supra therapeutic 4.7 even after holding coumadin last few days. This  could be 2/2 to ceftriaxone potentiation of coumadin. Hold coumadin. Recheck INR daily.  HTN - cont metoprolol.  Chronic normocytic anemia -- of chronic disease. b/l hgb around 10.  - stable. Monitor. Gets Epo injections every 21 days. Due 08/21/14. Didn't get yesterday b/c hgb was 11, now 9, will ask pharmacy to give aranesp while in hospital.  hypomag - cont mag ox Hypothyroidism - cont levothyroxine GERD - cont ppi.  Dispo: Disposition is deferred at this time, awaiting improvement of current medical problems.  Anticipated discharge in approximately 1-2 day(s).   The patient does have a current PCP (JMauricia AreaMD) and [facility].  The patient does have transportation limitations that hinder transportation to clinic appointments.  .Services Needed at time of discharge: Y = Yes, Blank = No PT:   OT:   RN:   Equipment:   Other:     LOS: 2 days   TaDellia NimsMD 08/22/2014, 7:40 AM

## 2014-08-23 LAB — CBC
HCT: 28.4 % — ABNORMAL LOW (ref 36.0–46.0)
HEMOGLOBIN: 8.8 g/dL — AB (ref 12.0–15.0)
MCH: 28.4 pg (ref 26.0–34.0)
MCHC: 31 g/dL (ref 30.0–36.0)
MCV: 91.6 fL (ref 78.0–100.0)
Platelets: 117 10*3/uL — ABNORMAL LOW (ref 150–400)
RBC: 3.1 MIL/uL — ABNORMAL LOW (ref 3.87–5.11)
RDW: 16 % — ABNORMAL HIGH (ref 11.5–15.5)
WBC: 13.9 10*3/uL — ABNORMAL HIGH (ref 4.0–10.5)

## 2014-08-23 LAB — CREATININE, URINE, RANDOM: CREATININE, URINE: 84.96 mg/dL

## 2014-08-23 LAB — URINALYSIS, ROUTINE W REFLEX MICROSCOPIC
Bilirubin Urine: NEGATIVE
GLUCOSE, UA: NEGATIVE mg/dL
HGB URINE DIPSTICK: NEGATIVE
Ketones, ur: NEGATIVE mg/dL
LEUKOCYTES UA: NEGATIVE
Nitrite: NEGATIVE
PH: 5 (ref 5.0–8.0)
Protein, ur: NEGATIVE mg/dL
Specific Gravity, Urine: 1.012 (ref 1.005–1.030)
UROBILINOGEN UA: 0.2 mg/dL (ref 0.0–1.0)

## 2014-08-23 LAB — PROCALCITONIN: Procalcitonin: 18.83 ng/mL

## 2014-08-23 LAB — BASIC METABOLIC PANEL
ANION GAP: 10 (ref 5–15)
BUN: 45 mg/dL — ABNORMAL HIGH (ref 6–23)
CO2: 18 mmol/L — ABNORMAL LOW (ref 19–32)
CREATININE: 2.7 mg/dL — AB (ref 0.50–1.10)
Calcium: 8.5 mg/dL (ref 8.4–10.5)
Chloride: 106 mmol/L (ref 96–112)
GFR calc non Af Amer: 17 mL/min — ABNORMAL LOW (ref >90)
GFR, EST AFRICAN AMERICAN: 19 mL/min — AB (ref >90)
GLUCOSE: 86 mg/dL (ref 70–99)
POTASSIUM: 4 mmol/L (ref 3.5–5.1)
Sodium: 134 mmol/L — ABNORMAL LOW (ref 135–145)

## 2014-08-23 LAB — CK: Total CK: 103 U/L (ref 7–177)

## 2014-08-23 LAB — SODIUM, URINE, RANDOM: Sodium, Ur: 22 mmol/L

## 2014-08-23 LAB — PROTIME-INR
INR: 3.29 — ABNORMAL HIGH (ref 0.00–1.49)
PROTHROMBIN TIME: 33.8 s — AB (ref 11.6–15.2)

## 2014-08-23 MED ORDER — DIPHENHYDRAMINE HCL 25 MG PO CAPS
25.0000 mg | ORAL_CAPSULE | Freq: Once | ORAL | Status: AC
  Administered 2014-08-24: 25 mg via ORAL
  Filled 2014-08-23: qty 1

## 2014-08-23 MED ORDER — DM-GUAIFENESIN ER 30-600 MG PO TB12
1.0000 | ORAL_TABLET | Freq: Two times a day (BID) | ORAL | Status: DC | PRN
  Administered 2014-08-23 – 2014-08-24 (×2): 1 via ORAL
  Filled 2014-08-23 (×3): qty 1

## 2014-08-23 MED ORDER — DIPHENHYDRAMINE HCL 50 MG PO CAPS: 50.0000 mg | ORAL_CAPSULE | Freq: Once | ORAL | Status: DC

## 2014-08-23 NOTE — Progress Notes (Signed)
SATURATION QUALIFICATIONS: (This note is used to comply with regulatory documentation for home oxygen)  Patient Saturations on Room Air at Rest = 88%  Patient Saturations on Room Air while Ambulating = 80%  Patient Saturations on 2 Liters of oxygen while Ambulating = 93%  Please briefly explain why patient needs home oxygen:

## 2014-08-23 NOTE — Progress Notes (Signed)
[facility] for coumadin Indication: hx DVT and PAF  Allergies  Allergen Reactions  . Amoxicillin Anaphylaxis  . Ampicillin Anaphylaxis  . Ciprofloxacin Swelling    "of my throat"  . Erythromycin Anaphylaxis  . Penicillins Anaphylaxis  . Sulfa Antibiotics Itching  . Sulfamethoxazole-Trimethoprim Itching  . Tetracycline Anaphylaxis  . Labetalol Nausea And Vomiting    Patient Measurements: Height: 5' 3"  (160 cm) Weight: 134 lb 1 oz (60.81 kg) IBW/kg (Calculated) : 52.4  Vital Signs: Temp: 97.8 F (36.6 C) (04/16 0533) Temp Source: Oral (04/16 0533) BP: 134/62 mmHg (04/16 0533) Pulse Rate: 66 (04/16 0533)  Labs:  Recent Labs  08/21/14 0702 08/21/14 1526 08/22/14 0545 08/23/14 0723  HGB 11.2*  --  9.0* 8.8*  HCT 36.2  --  28.5* 28.4*  PLT 100*  --  104* 117*  LABPROT 37.0*  --  44.6* 33.8*  INR 3.70*  --  4.70* 3.29*  CREATININE 2.67* 2.78* 2.88* 2.70*  CKTOTAL  --   --   --  103    Estimated Creatinine Clearance: 15.8 mL/min (by C-G formula based on Cr of 2.7).   Assessment: 44 YOF on warfarin PTA for hx DVT and PAF. Admission INR elevated at 3.34. INR peaked at 4.7. This morning, it is down to 3.29. Patient has not received any warfarin since admission. Vitamin K has also not been given.  Home dose is 2.50m alternating with 1.276m Patient expressed to me that she was halving and then quarter-ing 59m22mablets. Recommend she is discharged on 2.59mg71mblets, and to simplify her regimen (ie specific days to take certain doses).  Goal of Therapy:  INR 2-3 Monitor platelets by anticoagulation protocol: Yes   Plan:  -hold warfarin today -daily PT/INR -Monitor for bleeding complications   Joban Colledge D. Ainsley Deakins, PharmD, BCPS Clinical Pharmacist Pager: [phone] 11:16 AM

## 2014-08-23 NOTE — Progress Notes (Addendum)
Subjective:  Pt seen and examined in AM. No acute events overnight. She denies fever or chills and has improved dyspnea and productive cough. She is making normal urine output and denies hematuria. She feels generalized weakness and would like to ambulate.    Objective: Vital signs in last 24 hours: Filed Vitals:   08/22/14 2146 08/22/14 2337 08/23/14 0533 08/23/14 0829  BP: 126/56  134/62   Pulse: 70 72 66   Temp: 98.3 F (36.8 C)  97.8 F (36.6 C)   TempSrc: Oral  Oral   Resp: 20 16 21    Height:      Weight:   134 lb 1 oz (60.81 kg)   SpO2: 97% 97% 97% 94%   Weight change: -1 lb 13.4 oz (-0.834 kg)  Intake/Output Summary (Last 24 hours) at 08/23/14 1150 Last data filed at 08/23/14 1003  Gross per 24 hour  Intake   2190 ml  Output   1200 ml  Net    990 ml   PHYSICAL EXAMINATION:  General: NAD Lungs: Bibasilar crackles (R>L), no wheezing or ronchi.  Heart: Normal rate and rhythm Abdomen: Soft, non-tender, non-distended, normal BS Extremities: No edema  Neuro: A & O x 3    Lab Results: Basic Metabolic Panel:  Recent Labs Lab 08/20/14 2051 08/21/14 0702  08/22/14 0545 08/23/14 0723  NA  --  135  < > 136 134*  K  --  5.5*  < > 4.1 4.0  CL  --  104  < > 104 106  CO2  --  17*  < > 19 18*  GLUCOSE  --  83  < > 99 86  BUN  --  41*  < > 48* 45*  CREATININE  --  2.67*  < > 2.88* 2.70*  CALCIUM  --  8.9  < > 8.5 8.5  MG 1.4* 2.1  --  2.1  --   PHOS 3.1  --   --   --   --   < > = values in this interval not displayed. Liver Function Tests:  Recent Labs Lab 08/20/14 2051  AST 26  ALT 20  ALKPHOS 94  BILITOT 0.7  PROT 6.0  ALBUMIN 3.1*   CBC:  Recent Labs Lab 08/21/14 0702 08/22/14 0545 08/23/14 0723  WBC 19.4* 17.0* 13.9*  NEUTROABS 17.2* 14.5*  --   HGB 11.2* 9.0* 8.8*  HCT 36.2 28.5* 28.4*  MCV 93.5 92.5 91.6  PLT 100* 104* 117*   Cardiac Enzymes:  Recent Labs Lab 08/23/14 0723  CKTOTAL 103    Coagulation:  Recent Labs Lab  08/20/14 1536 08/21/14 0702 08/22/14 0545 08/23/14 0723  LABPROT 34.1* 37.0* 44.6* 33.8*  INR 3.34* 3.70* 4.70* 3.29*   Urinalysis:  Recent Labs Lab 08/20/14 1650 08/23/14 0156  COLORURINE YELLOW YELLOW  LABSPEC 1.021 1.012  PHURINE 5.0 5.0  GLUCOSEU NEGATIVE NEGATIVE  HGBUR NEGATIVE NEGATIVE  BILIRUBINUR NEGATIVE NEGATIVE  KETONESUR NEGATIVE NEGATIVE  PROTEINUR NEGATIVE NEGATIVE  UROBILINOGEN 0.2 0.2  NITRITE NEGATIVE NEGATIVE  LEUKOCYTESUR NEGATIVE NEGATIVE     Micro Results: Recent Results (from the past 240 hour(s))  Blood culture (routine x 2)     Status: None (Preliminary result)   Collection Time: 08/20/14  2:34 PM  Result Value Ref Range Status   Specimen Description BLOOD RIGHT HAND  Final   Special Requests BOTTLES DRAWN AEROBIC ONLY Clute  Final   Culture   Final  BLOOD CULTURE RECEIVED NO GROWTH TO DATE CULTURE WILL BE HELD FOR 5 DAYS BEFORE ISSUING A FINAL NEGATIVE REPORT Performed at [facility]    Report Status PENDING  Incomplete  Blood culture (routine x 2)     Status: None (Preliminary result)   Collection Time: 08/20/14  3:40 PM  Result Value Ref Range Status   Specimen Description BLOOD HAND LEFT  Final   Special Requests   Final    BOTTLES DRAWN AEROBIC AND ANAEROBIC 10CCBLUE 5CCRED   Culture   Final           BLOOD CULTURE RECEIVED NO GROWTH TO DATE CULTURE WILL BE HELD FOR 5 DAYS BEFORE ISSUING A FINAL NEGATIVE REPORT Performed at [facility]    Report Status PENDING  Incomplete  MRSA PCR Screening     Status: None   Collection Time: 08/20/14  8:45 PM  Result Value Ref Range Status   MRSA by PCR NEGATIVE NEGATIVE Final    Comment:        The GeneXpert MRSA Assay (FDA approved for NASAL specimens only), is one component of a comprehensive MRSA colonization surveillance program. It is not intended to diagnose MRSA infection nor to guide or monitor treatment for MRSA infections.   Culture, expectorated  sputum-assessment     Status: None   Collection Time: 08/21/14  3:07 PM  Result Value Ref Range Status   Specimen Description SPUTUM  Final   Special Requests Immunocompromised  Final   Sputum evaluation   Final    THIS SPECIMEN IS ACCEPTABLE. RESPIRATORY CULTURE REPORT TO FOLLOW.   Report Status 08/21/2014 FINAL  Final  Culture, respiratory (NON-Expectorated)     Status: None (Preliminary result)   Collection Time: 08/21/14  3:07 PM  Result Value Ref Range Status   Specimen Description SPUTUM  Final   Special Requests NONE  Final   Gram Stain   Final    MODERATE WBC PRESENT,BOTH PMN AND MONONUCLEAR MODERATE SQUAMOUS EPITHELIAL CELLS PRESENT MODERATE GRAM POSITIVE COCCI IN PAIRS FEW GRAM NEGATIVE RODS RARE GRAM POSITIVE RODS    Culture PENDING  Incomplete   Report Status PENDING  Incomplete  Clostridium Difficile by PCR     Status: None   Collection Time: 08/22/14  4:52 AM  Result Value Ref Range Status   C difficile by pcr NEGATIVE NEGATIVE Final   Studies/Results: No results found. Medications: I have reviewed the patient's current medications. Scheduled Meds: . amiodarone  100 mg Oral BID  . antiseptic oral rinse  7 mL Mouth Rinse BID  . calcitRIOL  0.25 mcg Oral QODAY  . cefTRIAXone (ROCEPHIN)  IV  2 g Intravenous Q24H  . dextromethorphan-guaiFENesin  1 tablet Oral BID  . feeding supplement (ENSURE ENLIVE)  237 mL Oral BID BM  . fluticasone  2 spray Each Nare Daily  . gabapentin  200 mg Oral QHS  . levothyroxine  100 mcg Oral QAC breakfast  . loratadine  10 mg Oral Daily  . metoprolol  100 mg Oral BID  . mycophenolate  360 mg Oral BID  . pantoprazole  40 mg Oral Daily  . predniSONE  10 mg Oral Daily  . tacrolimus  4 mg Oral QPM  . tacrolimus  5 mg Oral q morning - 10a  . Warfarin - Pharmacist Dosing Inpatient   Does not apply q1800   Continuous Infusions: . sodium chloride 100 mL/hr at 08/23/14 0351   PRN Meds:.acetaminophen, benzonatate,  HYDROcodone-acetaminophen, ipratropium-albuterol, ondansetron (ZOFRAN) IV  Assessment/Plan:  Streptococcus Right Lower Lobe Community Acquired Pneumonia - Currently 94% on 2 L O2. Pt with improvement of dyspnea and productive cough. Pt afebrile with improving leukocytosis.  -Appreciate ID recommendations  -Ambulate and measure SpO2  -Oxygen therapy to keep SpO2 >92% -Continue IV ceftriaxone Day 4 (at least 5 days per ID) -Follow-up blood/sputum cultures and PCT level -Continue duoneb Q 4 hr PRN -Continue mucinex-DM BID PRN and tessalon 100 mg BID PRN -Obtain PT and OT consult for deconditioning and ambulate with every shift -Pt needs repeat CXR in 6-8 weeks to ensure resolution  Nonoliguric AKI on CKD Stage 4 in setting of renal transplant (2006) - Cr improved to 2.7 from 2.88 yesterday above baseline 2.5. Most likely pre-renal azotemia in setting of volume depletion from PNA with calculated FeNa of 0.5%. CK level normal. -Appreciate nephrology recommendations -Continue NS 100 mL/hr  -Avoid nephrotoxins and continue to monitor daily weights and strict I/O's -Continue stress dose prednisone 10 mg (increase from 5 mg) daily, prograf 5 mg AM/4 mg PM, and myfortic 360 mg BID  -Continue calcitriol 0.25 mcg daily  -IF does not improve will obtain renal transplant [place]  PAF - INR supratherapeutic at 3.29. Pt with normal rate and rhythm.  -Continue coumadin per pharmacy with INR goal 2-3 -Continue amiodarone 100 mg BID  -Continue lopressor 100 mg BID   Hypertension - Currently normotensive -Continue lopressor 100 mg BID   Hypothyroidism - Last TSH normal on 12/20/11. -Continue synthroid 100 mcg daily -Repeat TSH level as outpatient after resolution of acute illness   Anemia of chronic disease - Hg 8.8 below baseline 12 with no active bleeding or hemodynamic instability. Pt received Aranesp on 4/15. Last anemia panel on 07/24/14 consistent with ACD.  -Monitor for bleeding  -Continue outpatient  Epogen 40 K U every 21 days -Pt is Jehovah Witness and refuses blood products  Allergic Rhinitis - Symptoms currently controlled. -Continue flonase 2 sprays daily and claritin 10 mg daily   GERD - No current reflux symptoms.  -Continue protonix 40 mg daily  HIV and Hep C Screening - HIV negative. -Follow-up HCV Ab   Dispo: Disposition is deferred at this time, awaiting improvement of current medical problems.  Anticipated discharge in approximately 2-3 day(s).   The patient does have a current PCP Mauricia Area, MD) and does need an [facility] hospital follow-up appointment after discharge.  The patient does not have transportation limitations that hinder transportation to clinic appointments.  .Services Needed at time of discharge: Y = Yes, Blank = No PT:   OT:   RN:   Equipment:   Other:     LOS: 3 days   Juluis Mire, MD 08/23/2014, 11:50 AM

## 2014-08-23 NOTE — Progress Notes (Signed)
Assessment:  1 Renal transplant with chronic renal dysfunction  2 Anemia and refuses blood products 3 PNA Plan: 1 Cont current management, renal fct is stable  Subjective: Interval History: none  Objective: Vital signs in last 24 hours: Temp:  [97.5 F (36.4 C)-98.3 F (36.8 C)] 97.8 F (36.6 C) (04/16 0533) Pulse Rate:  [66-72] 66 (04/16 0533) Resp:  [16-21] 21 (04/16 0533) BP: (126-139)/(56-73) 134/62 mmHg (04/16 0533) SpO2:  [94 %-100 %] 94 % (04/16 0829) Weight:  [60.81 kg (134 lb 1 oz)] 60.81 kg (134 lb 1 oz) (04/16 0533) Weight change: -0.834 kg (-1 lb 13.4 oz)  Intake/Output from previous day: 04/15 0701 - 04/16 0700 In: 1620 [P.O.:320; I.V.:1200; IV Piggyback:100] Out: 1200 [Urine:1200] Intake/Output this shift: Total I/O In: 570 [P.O.:570] Out: -   PE Lungs clear COr RRR Abd soft mild RLQ superficial tenderness at hernia Ext old AVG LUE, no CCE  Lab Results:  Recent Labs  08/22/14 0545 08/23/14 0723  WBC 17.0* 13.9*  HGB 9.0* 8.8*  HCT 28.5* 28.4*  PLT 104* 117*   BMET:  Recent Labs  08/22/14 0545 08/23/14 0723  NA 136 134*  K 4.1 4.0  CL 104 106  CO2 19 18*  GLUCOSE 99 86  BUN 48* 45*  CREATININE 2.88* 2.70*  CALCIUM 8.5 8.5   No results for input(s): PTH in the last 72 hours. Iron Studies: No results for input(s): IRON, TIBC, TRANSFERRIN, FERRITIN in the last 72 hours. Studies/Results: No results found.   LOS: 3 days   Alexius Ellington C 08/23/2014,3:03 PM

## 2014-08-23 NOTE — Progress Notes (Signed)
Pt c/o insomnia . Paged to on call provider and order received for benadryl . Will monitor.

## 2014-08-24 ENCOUNTER — Inpatient Hospital Stay (HOSPITAL_COMMUNITY): Payer: Medicare Other

## 2014-08-24 LAB — PROTIME-INR
INR: 2.9 — ABNORMAL HIGH (ref 0.00–1.49)
Prothrombin Time: 30.6 seconds — ABNORMAL HIGH (ref 11.6–15.2)

## 2014-08-24 LAB — CBC
HCT: 29.8 % — ABNORMAL LOW (ref 36.0–46.0)
HEMOGLOBIN: 9.4 g/dL — AB (ref 12.0–15.0)
MCH: 28.6 pg (ref 26.0–34.0)
MCHC: 31.5 g/dL (ref 30.0–36.0)
MCV: 90.6 fL (ref 78.0–100.0)
Platelets: 141 10*3/uL — ABNORMAL LOW (ref 150–400)
RBC: 3.29 MIL/uL — ABNORMAL LOW (ref 3.87–5.11)
RDW: 15.8 % — ABNORMAL HIGH (ref 11.5–15.5)
WBC: 13.3 10*3/uL — AB (ref 4.0–10.5)

## 2014-08-24 LAB — CULTURE, RESPIRATORY

## 2014-08-24 LAB — RENAL FUNCTION PANEL
ANION GAP: 14 (ref 5–15)
Albumin: 2.9 g/dL — ABNORMAL LOW (ref 3.5–5.2)
BUN: 42 mg/dL — ABNORMAL HIGH (ref 6–23)
CO2: 17 mmol/L — AB (ref 19–32)
CREATININE: 2.62 mg/dL — AB (ref 0.50–1.10)
Calcium: 8.7 mg/dL (ref 8.4–10.5)
Chloride: 104 mmol/L (ref 96–112)
GFR calc Af Amer: 20 mL/min — ABNORMAL LOW (ref >90)
GFR calc non Af Amer: 17 mL/min — ABNORMAL LOW (ref >90)
Glucose, Bld: 80 mg/dL (ref 70–99)
PHOSPHORUS: 2.8 mg/dL (ref 2.3–4.6)
POTASSIUM: 3.9 mmol/L (ref 3.5–5.1)
SODIUM: 135 mmol/L (ref 135–145)

## 2014-08-24 LAB — CULTURE, RESPIRATORY W GRAM STAIN: Culture: NORMAL

## 2014-08-24 LAB — PROCALCITONIN: Procalcitonin: 6.72 ng/mL

## 2014-08-24 MED ORDER — WARFARIN SODIUM 1 MG PO TABS
1.0000 mg | ORAL_TABLET | Freq: Once | ORAL | Status: AC
  Administered 2014-08-24: 1 mg via ORAL
  Filled 2014-08-24: qty 1

## 2014-08-24 MED ORDER — FUROSEMIDE 10 MG/ML IJ SOLN
40.0000 mg | Freq: Three times a day (TID) | INTRAMUSCULAR | Status: DC
  Administered 2014-08-24 – 2014-08-26 (×7): 40 mg via INTRAVENOUS
  Filled 2014-08-24 (×9): qty 4

## 2014-08-24 MED ORDER — POTASSIUM CHLORIDE CRYS ER 20 MEQ PO TBCR
40.0000 meq | EXTENDED_RELEASE_TABLET | Freq: Once | ORAL | Status: AC
  Administered 2014-08-24: 40 meq via ORAL
  Filled 2014-08-24: qty 2

## 2014-08-24 MED ORDER — FUROSEMIDE 10 MG/ML IJ SOLN
20.0000 mg | Freq: Once | INTRAMUSCULAR | Status: AC
  Administered 2014-08-24: 20 mg via INTRAVENOUS
  Filled 2014-08-24: qty 2

## 2014-08-24 NOTE — Evaluation (Signed)
Occupational Therapy Evaluation Patient Details Name: [name] MRN: [id number] DOB: 11-30-1940 Today's Date: 08/24/2014    History of Present Illness Admitted with cough, fever, syncope. found to have CAP   Clinical Impression   This 74 yo female admitted with above presents to acute OT with decrease in O2 sats with activity and decreased balance--both affecting her ability to be independent with BADLs. She will benefit from one more session of OT to go over energy conservation strategies and AE/DME.    Follow Up Recommendations  No OT follow up    Equipment Recommendations   (TBD once husband checks at home)       Precautions / Restrictions Precautions Precautions: Fall Precaution Comments: watch O2 sats Restrictions Weight Bearing Restrictions: No      Mobility Bed Mobility Overal bed mobility: Modified Independent             General bed mobility comments: HOB up  Transfers Overall transfer level: Needs assistance Equipment used: Rolling walker (2 wheeled) Transfers: Sit to/from Stand Sit to Stand: Supervision         General transfer comment: Ambulated 100 feet with RW with drop in O2 sats to mid 70s on 2 liters of O2    Balance Overall balance assessment: Needs assistance Sitting-balance support: No upper extremity supported;Feet supported Sitting balance-Leahy Scale: Good     Standing balance support: Bilateral upper extremity supported Standing balance-Leahy Scale: Fair                              ADL Overall ADL's : Needs assistance/impaired Eating/Feeding: Independent;Sitting   Grooming: Set up;Sitting   Upper Body Bathing: Set up;Sitting   Lower Body Bathing: Set up;Sit to/from stand   Upper Body Dressing : Set up;Sitting   Lower Body Dressing: Set up;Sit to/from stand   Toilet Transfer: Supervision/safety;Ambulation;RW (bed>out door 50 feet and back to room)   Toileting- Clothing Manipulation and Hygiene:  Supervision/safety;Sit to/from stand         General ADL Comments: Educated on purse lipped breathing. We talked about the importance of using her tub seat/bench for now until she gets stronger and sats remain higher wtih activity     Vision Additional Comments: No change from baseline          Pertinent Vitals/Pain Pain Assessment: 0-10 Pain Score: 3  Pain Location: left hip (dissapatied the more she walked) Pain Descriptors / Indicators: Aching (with ambulation) Pain Intervention(s): Monitored during session     Hand Dominance Right   Extremity/Trunk Assessment Upper Extremity Assessment Upper Extremity Assessment: Overall WFL for tasks assessed   Lower Extremity Assessment Lower Extremity Assessment: Overall WFL for tasks assessed       Communication Communication Communication: No difficulties   Cognition Arousal/Alertness: Awake/alert Behavior During Therapy: WFL for tasks assessed/performed Overall Cognitive Status: Within Functional Limits for tasks assessed                                Home Living Family/patient expects to be discharged to:: Private residence Living Arrangements: Spouse/significant other Available Help at Discharge: Family;Available 24 hours/day Type of Home: House Home Access: Stairs to enter [employer] of Steps: 1 Entrance Stairs-Rails: None Home Layout: Multi-level Alternate Level Stairs-Number of Steps: split level (6-7 stairs up and 6-7 stairs down Alternate Level Stairs-Rails: Right Bathroom Shower/Tub: Tub/shower unit;Curtain Shower/tub characteristics: Architectural technologist: Standard  Home Equipment:  (checking to see if they have a RW and a tub seat/bench)          Prior Functioning/Environment Level of Independence: Independent (BADLs)        Comments: A with IADLs. "I know I probably need a walker or cane, but I just see it as a stigma"    OT Diagnosis: Generalized weakness   OT  Problem List: Cardiopulmonary status limiting activity;Impaired balance (sitting and/or standing)   OT Treatment/Interventions: Self-care/ADL training;Patient/family education;Balance training;DME and/or AE instruction    OT Goals(Current goals can be found in the care plan section) Acute Rehab OT Goals Patient Stated Goal: to go home (really do not want a walker, but now it will be safer) OT Goal Formulation: With patient/family Time For Goal Achievement: 08/31/14 Potential to Achieve Goals: Good  OT Frequency: Min 2X/week              End of Session Equipment Utilized During Treatment: Rolling walker;Oxygen (2 liters)  Activity Tolerance: Patient tolerated treatment well (however O2 dropped into mid 70s on 2 liters of O2 with ambulation) Patient left: in bed;with call bell/phone within reach;with family/visitor present   Time: 2010-0712 OT Time Calculation (min): 37 min Charges:  OT General Charges $OT Visit: 1 Procedure OT Evaluation $Initial OT Evaluation Tier I: 1 Procedure OT Treatments $Self Care/Home Management : 8-22 mins  Almon Register [phone] 08/24/2014, 12:33 PM

## 2014-08-24 NOTE — Progress Notes (Signed)
Pt refuses CPAP tonight. RT will monitor.

## 2014-08-24 NOTE — Progress Notes (Signed)
On-call provider notified of BP( 166/74 mm of hg)  and also made aware of weight difference  from yesterday and this morning. No new order received at this time. Will cont to monitor.

## 2014-08-24 NOTE — Evaluation (Signed)
Physical Therapy Evaluation Patient Details Name: [name] MRN: [id number] DOB: [birth year] Today's Date: 08/24/2014   History of Present Illness  Admitted with cough, fever, syncope. found to have CAP  Clinical Impression  Pt admitted with above diagnosis. Pt currently with functional limitations due to the deficits listed below (see PT Problem List). At the time of PT eval pt was able to perform transfers and ambulation with guarding for assist. Overall, tolerance for functional activity is low. Pt very fatigued at end of session and began feeling "bad". RN notified and vitals taken - BP 145/67 which pt states is normal for her. O2 in mid 90's on 2L/min supplemental O2 at rest. Pt was  Pt will benefit from skilled PT to increase their independence and safety with mobility to allow discharge to the venue listed below.       Follow Up Recommendations Home health PT;Supervision - Intermittent    Equipment Recommendations  Other (comment) (TBD when husband checks at home)    Recommendations for Other Services       Precautions / Restrictions Precautions Precautions: Fall Precaution Comments: Watch O2 sats Restrictions Weight Bearing Restrictions: No      Mobility  Bed Mobility Overal bed mobility: Modified Independent             General bed mobility comments: Pt was able to position herself back in the bed with HOB elevated. Pt states that she doesn't want to lay flatter due to difficulty breathing.   Transfers Overall transfer level: Needs assistance Equipment used: Rolling walker (2 wheeled) Transfers: Sit to/from Stand Sit to Stand: Supervision         General transfer comment: VC's for hand placement on seated surface for safety. Supervision for safety.   Ambulation/Gait Ambulation/Gait assistance: Min guard Ambulation Distance (Feet): 100 Feet Assistive device: Rolling walker (2 wheeled) Gait Pattern/deviations: Step-through pattern;Decreased stride  length;Trunk flexed Gait velocity: Decreased Gait velocity interpretation: Below normal speed for age/gender General Gait Details: Ambulated 100 feet with RW with drop in O2 sats to mid-high 80s on 2 liters of O2.  Stairs            Wheelchair Mobility    Modified Rankin (Stroke Patients Only)       Balance Overall balance assessment: Needs assistance Sitting-balance support: Feet supported;No upper extremity supported Sitting balance-Leahy Scale: Good     Standing balance support: Bilateral upper extremity supported Standing balance-Leahy Scale: Fair Standing balance comment: Do not feel pt requires UE support to maintain balance but used the walker since it was there.                              Pertinent Vitals/Pain Pain Assessment: No/denies pain Pain Score: 3  Pain Location: left hip (dissapatied the more she walked) Pain Descriptors / Indicators: Aching (with ambulation) Pain Intervention(s): Monitored during session    Home Living Family/patient expects to be discharged to:: Private residence Living Arrangements: Spouse/significant other Available Help at Discharge: Family;Available 24 hours/day Type of Home: House Home Access: Stairs to enter Entrance Stairs-Rails: None Entrance Stairs-Number of Steps: 1 Home Layout: Multi-level Home Equipment: Other (comment) (checking to see if they have a RW and a tub seat/bench)      Prior Function Level of Independence: Independent         Comments: A with IADLs. "I know I probably need a walker or cane, but I just see it as a stigma"  Hand Dominance   Dominant Hand: Right    Extremity/Trunk Assessment   Upper Extremity Assessment: Overall WFL for tasks assessed           Lower Extremity Assessment: Overall WFL for tasks assessed      Cervical / Trunk Assessment: Normal  Communication   Communication: No difficulties  Cognition Arousal/Alertness: Awake/alert Behavior During  Therapy: WFL for tasks assessed/performed Overall Cognitive Status: Within Functional Limits for tasks assessed                      General Comments      Exercises        Assessment/Plan    PT Assessment Patient needs continued PT services  PT Diagnosis Difficulty walking;Generalized weakness   PT Problem List Decreased strength;Decreased range of motion;Decreased activity tolerance;Decreased balance;Decreased mobility;Decreased knowledge of use of DME;Decreased safety awareness;Decreased knowledge of precautions;Cardiopulmonary status limiting activity  PT Treatment Interventions DME instruction;Gait training;Stair training;Functional mobility training;Therapeutic activities;Therapeutic exercise;Neuromuscular re-education;Patient/family education   PT Goals (Current goals can be found in the Care Plan section) Acute Rehab PT Goals Patient Stated Goal: to go home (really do not want a walker, but know it will be safer) PT Goal Formulation: With patient/family Time For Goal Achievement: 08/31/14 Potential to Achieve Goals: Good    Frequency Min 3X/week   Barriers to discharge        Co-evaluation               End of Session Equipment Utilized During Treatment: Gait belt;Oxygen Activity Tolerance: Patient limited by fatigue Patient left: in bed;with call bell/phone within reach;with family/visitor present Nurse Communication: Mobility status;Other (comment) (Notified RN that pt was not feeling well. )         Time: 6734-1937 PT Time Calculation (min) (ACUTE ONLY): 21 min   Charges:   PT Evaluation $Initial PT Evaluation Tier I: 1 Procedure     PT G Codes:        Rolinda Roan 09/09/14, 2:58 PM  Rolinda Roan, PT, DPT Acute Rehabilitation Services Pager: [phone]

## 2014-08-24 NOTE — Plan of Care (Signed)
Problem: Phase III Progression Outcomes Goal: Convert IV antibiotics to PO Outcome: Not Met (add Reason) Pt is on IV abx

## 2014-08-24 NOTE — Progress Notes (Signed)
Subjective:  Had some crackles overnight and fluids were stopped. Received 1x IV lasix 35m. She was having orthopnea and had hard time sleeping due to it. Was feeling congested. Now she feels better after receiving the lasix.    Afebrile overnight. Coughing some but better, breathing is better. No n/v/diarrhea.   Objective: Vital signs in last 24 hours: Filed Vitals:   08/24/14 0101 08/24/14 0117 08/24/14 0221 08/24/14 0630  BP:  177/78 163/80 166/74  Pulse: 77 71 73 74  Temp:    99.1 F (37.3 C)  TempSrc:    Oral  Resp: 20 19 18 20   Height:      Weight:    142 lb 12.8 oz (64.774 kg)  SpO2: 94% 95% 100% 100%   Weight change: 8 lb 11.8 oz (3.964 kg)  Intake/Output Summary (Last 24 hours) at 08/24/14 0911 Last data filed at 08/24/14 0741  Gross per 24 hour  Intake 1803.34 ml  Output   1000 ml  Net 803.34 ml   Vitals reviewed. General: resting in bed, NAD HEENT: PERRL, EOMI, no scleral icterus Cardiac: RRR, no rubs, murmurs or gallops Pulm: mild crackles on bases. No wheezing. Abd: soft, nontender, nondistended, BS present, surgical hernia on RLQ. Ext: warm and well perfused, no pedal edema Neuro: alert and oriented X3, cranial nerves II-XII grossly intact, strength and sensation to light touch equal in bilateral upper and lower extremities  Lab Results: Basic Metabolic Panel:  Recent Labs Lab 08/20/14 2051 08/21/14 0702  08/22/14 0545 08/23/14 0723 08/24/14 0655  NA  --  135  < > 136 134* 135  K  --  5.5*  < > 4.1 4.0 3.9  CL  --  104  < > 104 106 104  CO2  --  17*  < > 19 18* 17*  GLUCOSE  --  83  < > 99 86 80  BUN  --  41*  < > 48* 45* 42*  CREATININE  --  2.67*  < > 2.88* 2.70* 2.62*  CALCIUM  --  8.9  < > 8.5 8.5 8.7  MG 1.4* 2.1  --  2.1  --   --   PHOS 3.1  --   --   --   --  2.8  < > = values in this interval not displayed. Liver Function Tests:  Recent Labs Lab 08/20/14 2051 08/24/14 0655  AST 26  --   ALT 20  --   ALKPHOS 94  --     BILITOT 0.7  --   PROT 6.0  --   ALBUMIN 3.1* 2.9*   No results for input(s): LIPASE, AMYLASE in the last 168 hours. No results for input(s): AMMONIA in the last 168 hours. CBC:  Recent Labs Lab 08/21/14 0702 08/22/14 0545 08/23/14 0723 08/24/14 0655  WBC 19.4* 17.0* 13.9* 13.3*  NEUTROABS 17.2* 14.5*  --   --   HGB 11.2* 9.0* 8.8* 9.4*  HCT 36.2 28.5* 28.4* 29.8*  MCV 93.5 92.5 91.6 90.6  PLT 100* 104* 117* 141*  Coagulation:  Recent Labs Lab 08/21/14 0702 08/22/14 0545 08/23/14 0723 08/24/14 0655  LABPROT 37.0* 44.6* 33.8* 30.6*  INR 3.70* 4.70* 3.29* 2.90*   Urinalysis:  Recent Labs Lab 08/20/14 1650 08/23/14 0156  COLORURINE YELLOW YELLOW  LABSPEC 1.021 1.012  PHURINE 5.0 5.0  GLUCOSEU NEGATIVE NEGATIVE  HGBUR NEGATIVE NEGATIVE  BILIRUBINUR NEGATIVE NEGATIVE  KETONESUR NEGATIVE NEGATIVE  PROTEINUR NEGATIVE NEGATIVE  UROBILINOGEN 0.2 0.2  NITRITE NEGATIVE  NEGATIVE  LEUKOCYTESUR NEGATIVE NEGATIVE   Micro Results: Recent Results (from the past 240 hour(s))  Blood culture (routine x 2)     Status: None (Preliminary result)   Collection Time: 08/20/14  2:34 PM  Result Value Ref Range Status   Specimen Description BLOOD RIGHT HAND  Final   Special Requests BOTTLES DRAWN AEROBIC ONLY 2CC  Final   Culture   Final           BLOOD CULTURE RECEIVED NO GROWTH TO DATE CULTURE WILL BE HELD FOR 5 DAYS BEFORE ISSUING A FINAL NEGATIVE REPORT Performed at [facility]    Report Status PENDING  Incomplete  Blood culture (routine x 2)     Status: None (Preliminary result)   Collection Time: 08/20/14  3:40 PM  Result Value Ref Range Status   Specimen Description BLOOD HAND LEFT  Final   Special Requests   Final    BOTTLES DRAWN AEROBIC AND ANAEROBIC 10CCBLUE 5CCRED   Culture   Final           BLOOD CULTURE RECEIVED NO GROWTH TO DATE CULTURE WILL BE HELD FOR 5 DAYS BEFORE ISSUING A FINAL NEGATIVE REPORT Performed at [facility]    Report Status  PENDING  Incomplete  MRSA PCR Screening     Status: None   Collection Time: 08/20/14  8:45 PM  Result Value Ref Range Status   MRSA by PCR NEGATIVE NEGATIVE Final    Comment:        The GeneXpert MRSA Assay (FDA approved for NASAL specimens only), is one component of a comprehensive MRSA colonization surveillance program. It is not intended to diagnose MRSA infection nor to guide or monitor treatment for MRSA infections.   Culture, expectorated sputum-assessment     Status: None   Collection Time: 08/21/14  3:07 PM  Result Value Ref Range Status   Specimen Description SPUTUM  Final   Special Requests Immunocompromised  Final   Sputum evaluation   Final    THIS SPECIMEN IS ACCEPTABLE. RESPIRATORY CULTURE REPORT TO FOLLOW.   Report Status 08/21/2014 FINAL  Final  Culture, respiratory (NON-Expectorated)     Status: None (Preliminary result)   Collection Time: 08/21/14  3:07 PM  Result Value Ref Range Status   Specimen Description SPUTUM  Final   Special Requests NONE  Final   Gram Stain   Final    MODERATE WBC PRESENT,BOTH PMN AND MONONUCLEAR MODERATE SQUAMOUS EPITHELIAL CELLS PRESENT MODERATE GRAM POSITIVE COCCI IN PAIRS FEW GRAM NEGATIVE RODS RARE GRAM POSITIVE RODS    Culture   Final    NORMAL OROPHARYNGEAL FLORA Performed at [facility]    Report Status PENDING  Incomplete  Clostridium Difficile by PCR     Status: None   Collection Time: 08/22/14  4:52 AM  Result Value Ref Range Status   C difficile by pcr NEGATIVE NEGATIVE Final   Studies/Results: Dg Chest 2 View  08/24/2014   CLINICAL DATA:  Pt complains of mid-sternal chest pain, SOB, and loss of appetite for 2 weeks; pt states she has HTN; non-smoker  EXAM: CHEST  2 VIEW  COMPARISON:  08/20/2014  FINDINGS: Since the prior exam, opacity has developed at the left lung base obscuring the left hemidiaphragm on the frontal view. There is also some increased interstitial and patchy airspace opacity at the right  lung base and in a perihilar distribution on the right. Minimal right pleural effusion.  Lungs are mildly hyperexpanded.  No evidence  of pulmonary edema.  Cardiac silhouette is mildly enlarged. No mediastinal or hilar masses or convincing adenopathy.  IMPRESSION: 1. Worsened lung aeration. 2. New opacity has developed a follow-up lung base, likely pneumonia. Patchy areas of opacity in the right perihilar and lower lung zone have increased, also likely due to infection.   Electronically Signed   By: Lajean Manes M.D.   On: 08/24/2014 08:40   Medications: I have reviewed the patient's current medications. Scheduled Meds: . amiodarone  100 mg Oral BID  . antiseptic oral rinse  7 mL Mouth Rinse BID  . calcitRIOL  0.25 mcg Oral QODAY  . cefTRIAXone (ROCEPHIN)  IV  2 g Intravenous Q24H  . feeding supplement (ENSURE ENLIVE)  237 mL Oral BID BM  . fluticasone  2 spray Each Nare Daily  . gabapentin  200 mg Oral QHS  . levothyroxine  100 mcg Oral QAC breakfast  . loratadine  10 mg Oral Daily  . metoprolol  100 mg Oral BID  . mycophenolate  360 mg Oral BID  . pantoprazole  40 mg Oral Daily  . predniSONE  10 mg Oral Daily  . tacrolimus  4 mg Oral QPM  . tacrolimus  5 mg Oral q morning - 10a  . Warfarin - Pharmacist Dosing Inpatient   Does not apply q1800   Continuous Infusions:   PRN Meds:.acetaminophen, benzonatate, dextromethorphan-guaiFENesin, HYDROcodone-acetaminophen, ipratropium-albuterol, ondansetron (ZOFRAN) IV Assessment/Plan: Principal Problem:   Streptococcal pneumonia Active Problems:   Atrial fibrillation   CKD (chronic kidney disease), stage IV   History of renal transplant   HTN (hypertension)   Hypothyroidism   Crohn's disease   Allergic rhinitis   History of DVT (deep vein thrombosis)   GERD (gastroesophageal reflux disease)   SLE (systemic lupus erythematosus)   OSA (obstructive sleep apnea)   Immunosuppressed status   Sepsis   Anemia of chronic disease    Syncope  74 yo female with kidney transplant CKD IV, on immunosuppresion, here with CAP.  Strep pneum + pneumonia - + strep pneum urine antigen and also sputum culture showing gram + cocci in apirs  -   leukocytosis improving (going up to 14.7 > 19.4>17>13) , CXR showing RLL infiltrate initially. Patient is complicated given she is on immunosuppression and also has allergies to lot of antibiotics. CXR today shows worsening LLL PNA but clinically she is improving. I think she is volume overloaded and her PNA is well controlled. Lasix improved her orthopnea so it supports volume overload more than pneumonia and she remains afebrile. - continue ceftriaxone for now (ID recommended at least 5 days IV ceftriaxone).  - will likely switch to ceftin on discharge for total 7-10days treatment. - f/up sputum cx (GPC in pairs), blood cx. - o2 sat to keep sat >92%.  - cont duoneb to see if it helps. - tessalon.   Acute hypoxic resp failure - likely 2/2 to volume overload - has hypoxia on room air during ambulation . This is likely 2/2 to volume overload caused by fluid and she had crackles on exam.  - stopped fluid, gave 1x IV lasix 56m. Dr. PFlorene [place] patient and ordered 470mIV lasix q8hr.  - reassess ambulatory oxygen tomorrow, repeat CXR tomorrow. - strict i/o's  AKI?on CKD IV -bl gfr around ~20-24.   Has hx of renal cell carcinoma s/p bilat nephrectomies, also hx of HD in the past, s/p kidney transplant      2006. Chronic immunosuppresion due to this. GFR now at  baseline - kidney function worsening was likely 2/2 to dehydration, which we corrected with fluid. But now she is volume overloaded with crackles so stopped the fluid. - monitor BMET. - appreciate nephro recs.  - needs close nephrology follow up with Dr. Jimmy Footman. - cont prograf 5 mg morning, 4 mg evening. - cont prednisone 71m daily (5 mg normally, for recent illness). - cont mycophenolate.  Lonstanding PAF and hx of DVT - on chronic  coumadin - cont amio 510mand metoprolol. Echo 2/14 [place] mild to moderate AR normal EF  - INR was 4.7 even after holding coumadin last few days but now improved to 2.90. This could be 2/2 to ceftriaxone potentiation of coumadin. Hold coumadin. Recheck INR daily.  HTN - cont metoprolol.  Chronic normocytic anemia -- of chronic disease. b/l hgb around 10.  - stable. Monitor. Gets Epo injections every 21 days. Due 08/21/14. Didn't get yesterday b/c hgb was 11, now 9, will ask pharmacy to give aranesp while in hospital.  hypomag - cont mag ox Hypothyroidism - cont levothyroxine GERD - cont ppi.  Dispo: Disposition is deferred at this time, awaiting improvement of current medical problems.  Anticipated discharge in approximately 1-2 day(s).   The patient does have a current PCP (JMauricia AreaMD) and [facility].  The patient does have transportation limitations that hinder transportation to clinic appointments.  .Services Needed at time of discharge: Y = Yes, Blank = No PT:   OT:   RN:   Equipment:   Other:     LOS: 4 days   TaDellia NimsMD 08/24/2014, 9:11 AM

## 2014-08-24 NOTE — Progress Notes (Addendum)
Rechecked BP is 163/80 mm of hg. Pt continues to have coarse crackles on auscultation and  is coughing more frequently than usual. On-call provider Dr Arcelia Jew paged and made aware. MD will be at bedside to see pt. Will monitor. Pt is resting in recliners. No s/s of distress noted. Will cont to monitor.

## 2014-08-24 NOTE — Progress Notes (Signed)
Assessment:  1 Renal transplant with chronic renal dysfunction  2 Anemia and refuses blood products 3 PNA 4 Volume overload Plan: 1 Diurese  Subjective: Interval History: SOB, swelling and orthopnea last PM.  Given lasix IV with response and fluids stopped  Objective: Vital signs in last 24 hours: Temp:  [98.3 F (36.8 C)-99.1 F (37.3 C)] 99.1 F (37.3 C) (04/17 0630) Pulse Rate:  [64-79] 79 (04/17 1223) Resp:  [18-21] 20 (04/17 0630) BP: (137-177)/(62-80) 166/74 mmHg (04/17 0630) SpO2:  [75 %-100 %] 75 % (04/17 1223) Weight:  [64.774 kg (142 lb 12.8 oz)] 64.774 kg (142 lb 12.8 oz) (04/17 0630) Weight change: 3.964 kg (8 lb 11.8 oz)  Intake/Output from previous day: 04/16 0701 - 04/17 0700 In: 2043.3 [P.O.:670; I.V.:1373.3] Out: 1000 [Urine:1000] Intake/Output this shift: Total I/O In: 540 [P.O.:540] Out: -   General appearance: alert and cooperative Resp: diminished breath sounds bilaterally Cardio: regular rate and rhythm, S1, S2 normal, no murmur, click, rub or gallop Extremities: edema 1+1+ presacral   Lab Results:  Recent Labs  08/23/14 0723 08/24/14 0655  WBC 13.9* 13.3*  HGB 8.8* 9.4*  HCT 28.4* 29.8*  PLT 117* 141*   BMET:  Recent Labs  08/23/14 0723 08/24/14 0655  NA 134* 135  K 4.0 3.9  CL 106 104  CO2 18* 17*  GLUCOSE 86 80  BUN 45* 42*  CREATININE 2.70* 2.62*  CALCIUM 8.5 8.7   No results for input(s): PTH in the last 72 hours. Iron Studies: No results for input(s): IRON, TIBC, TRANSFERRIN, FERRITIN in the last 72 hours. Studies/Results: Dg Chest 2 View  08/24/2014   CLINICAL DATA:  Pt complains of mid-sternal chest pain, SOB, and loss of appetite for 2 weeks; pt states she has HTN; non-smoker  EXAM: CHEST  2 VIEW  COMPARISON:  08/20/2014  FINDINGS: Since the prior exam, opacity has developed at the left lung base obscuring the left hemidiaphragm on the frontal view. There is also some increased interstitial and patchy airspace  opacity at the right lung base and in a perihilar distribution on the right. Minimal right pleural effusion.  Lungs are mildly hyperexpanded.  No evidence of pulmonary edema.  Cardiac silhouette is mildly enlarged. No mediastinal or hilar masses or convincing adenopathy.  IMPRESSION: 1. Worsened lung aeration. 2. New opacity has developed a follow-up lung base, likely pneumonia. Patchy areas of opacity in the right perihilar and lower lung zone have increased, also likely due to infection.   Electronically Signed   By: Lajean Manes M.D.   On: 08/24/2014 08:40   Scheduled: . amiodarone  100 mg Oral BID  . antiseptic oral rinse  7 mL Mouth Rinse BID  . calcitRIOL  0.25 mcg Oral QODAY  . cefTRIAXone (ROCEPHIN)  IV  2 g Intravenous Q24H  . feeding supplement (ENSURE ENLIVE)  237 mL Oral BID BM  . fluticasone  2 spray Each Nare Daily  . furosemide  40 mg Intravenous Q8H  . gabapentin  200 mg Oral QHS  . levothyroxine  100 mcg Oral QAC breakfast  . loratadine  10 mg Oral Daily  . metoprolol  100 mg Oral BID  . mycophenolate  360 mg Oral BID  . pantoprazole  40 mg Oral Daily  . potassium chloride  40 mEq Oral Once  . predniSONE  10 mg Oral Daily  . tacrolimus  4 mg Oral QPM  . tacrolimus  5 mg Oral q morning - 10a  . warfarin  1 mg Oral ONCE-1800  . Warfarin - Pharmacist Dosing Inpatient   Does not apply q1800     LOS: 4 days   [name] C 08/24/2014,12:50 PM

## 2014-08-24 NOTE — Progress Notes (Signed)
[facility] for coumadin Indication: hx DVT and PAF  Allergies  Allergen Reactions  . Amoxicillin Anaphylaxis  . Ampicillin Anaphylaxis  . Ciprofloxacin Swelling    "of my throat"  . Erythromycin Anaphylaxis  . Penicillins Anaphylaxis  . Sulfa Antibiotics Itching  . Sulfamethoxazole-Trimethoprim Itching  . Tetracycline Anaphylaxis  . Labetalol Nausea And Vomiting    Patient Measurements: Height: 5' 3"  (160 cm) Weight: 142 lb 12.8 oz (64.774 kg) IBW/kg (Calculated) : 52.4  Vital Signs: Temp: 99.1 F (37.3 C) (04/17 0630) Temp Source: Oral (04/17 0630) BP: 166/74 mmHg (04/17 0630) Pulse Rate: 74 (04/17 0630)  Labs:  Recent Labs  08/22/14 0545 08/23/14 0723 08/24/14 0655  HGB 9.0* 8.8* 9.4*  HCT 28.5* 28.4* 29.8*  PLT 104* 117* 141*  LABPROT 44.6* 33.8* 30.6*  INR 4.70* 3.29* 2.90*  CREATININE 2.88* 2.70* 2.62*  CKTOTAL  --  103  --     Estimated Creatinine Clearance: 17.8 mL/min (by C-G formula based on Cr of 2.62).   Assessment: 25 YOF on warfarin PTA for hx DVT and PAF. Admission INR elevated at 3.34. INR peaked at 4.7, this morning it is now in therapeutic range at 2.9. Patient has not received any warfarin since admission. Vitamin K has also not been given.  Home dose is 2.12m alternating with 1.227m Patient expressed to me that she was halving and then quarter-ing 87m43mablets. Recommend she is discharged on 2.87mg60mblets, and to simplify her regimen (ie specific days to take certain doses).  Goal of Therapy:  INR 2-3 Monitor platelets by anticoagulation protocol: Yes   Plan:  -warfarin 1mg 40mx1 tonight  -daily PT/INR -Monitor for bleeding complications   Diogo Anne D. Minervia Osso, PharmD, BCPS Clinical Pharmacist Pager: [phone]/2016 10:43 AM

## 2014-08-25 ENCOUNTER — Inpatient Hospital Stay (HOSPITAL_COMMUNITY): Payer: Medicare Other

## 2014-08-25 DIAGNOSIS — J154 Pneumonia due to other streptococci: Secondary | ICD-10-CM

## 2014-08-25 DIAGNOSIS — D649 Anemia, unspecified: Secondary | ICD-10-CM

## 2014-08-25 DIAGNOSIS — Z7901 Long term (current) use of anticoagulants: Secondary | ICD-10-CM

## 2014-08-25 DIAGNOSIS — D899 Disorder involving the immune mechanism, unspecified: Secondary | ICD-10-CM

## 2014-08-25 DIAGNOSIS — Z86718 Personal history of other venous thrombosis and embolism: Secondary | ICD-10-CM

## 2014-08-25 DIAGNOSIS — J90 Pleural effusion, not elsewhere classified: Secondary | ICD-10-CM

## 2014-08-25 DIAGNOSIS — J189 Pneumonia, unspecified organism: Secondary | ICD-10-CM | POA: Diagnosis present

## 2014-08-25 DIAGNOSIS — I480 Paroxysmal atrial fibrillation: Secondary | ICD-10-CM

## 2014-08-25 DIAGNOSIS — J9601 Acute respiratory failure with hypoxia: Secondary | ICD-10-CM

## 2014-08-25 LAB — PROTIME-INR
INR: 2.68 — AB (ref 0.00–1.49)
PROTHROMBIN TIME: 28.7 s — AB (ref 11.6–15.2)

## 2014-08-25 LAB — RENAL FUNCTION PANEL
Albumin: 2.7 g/dL — ABNORMAL LOW (ref 3.5–5.2)
Anion gap: 12 (ref 5–15)
BUN: 44 mg/dL — ABNORMAL HIGH (ref 6–23)
CO2: 19 mmol/L (ref 19–32)
Calcium: 8.7 mg/dL (ref 8.4–10.5)
Chloride: 105 mmol/L (ref 96–112)
Creatinine, Ser: 2.68 mg/dL — ABNORMAL HIGH (ref 0.50–1.10)
GFR calc Af Amer: 19 mL/min — ABNORMAL LOW (ref >90)
GFR calc non Af Amer: 17 mL/min — ABNORMAL LOW (ref >90)
Glucose, Bld: 87 mg/dL (ref 70–99)
PHOSPHORUS: 2.8 mg/dL (ref 2.3–4.6)
POTASSIUM: 4.6 mmol/L (ref 3.5–5.1)
Sodium: 136 mmol/L (ref 135–145)

## 2014-08-25 LAB — PROCALCITONIN: Procalcitonin: 4.41 ng/mL

## 2014-08-25 MED ORDER — ZOLPIDEM TARTRATE 5 MG PO TABS
5.0000 mg | ORAL_TABLET | Freq: Every evening | ORAL | Status: DC | PRN
  Administered 2014-08-25: 5 mg via ORAL
  Filled 2014-08-25: qty 1

## 2014-08-25 MED ORDER — DIPHENHYDRAMINE HCL 25 MG PO CAPS: 25.0000 mg | ORAL_CAPSULE | Freq: Every evening | ORAL | Status: DC | PRN

## 2014-08-25 MED ORDER — WARFARIN SODIUM 2 MG PO TABS
2.0000 mg | ORAL_TABLET | Freq: Once | ORAL | Status: AC
Start: 2014-08-25 — End: 2014-08-25
  Administered 2014-08-25: 2 mg via ORAL
  Filled 2014-08-25: qty 1

## 2014-08-25 NOTE — Progress Notes (Signed)
Patient ID: [name], female   DOB: 1940/07/15, 74 y.o.   MRN: [id number] Medicine attending: I personally interviewed and examined this patient today and reviewed pertinent clinical data with the housestaff and reviewed available radiographs. The patient has responded nicely to antibiotic therapy for streptococcal pneumonia. Right-sided pulmonary infiltrate is clearing. However, there is a new small pleural effusion blunting the left hemidiaphragm. This likely relates to iatrogenic fluid administration in somebody with advanced renal dysfunction. Fluids given at time of admission when she was running a high fever. She is currently being diuresed with parenteral furosemide 40 mg every 8 hours and we are making progress. Initial fluid balance 2.9 L ahead now down to 1.6 L. Nephrology following and assisting in management. She is clinically stable and afebrile. Exam remarkable only for decreased breath sounds and dullness to percussion at the left base with no egophony but decreased transmission of sounds. One of her daughters is present. Status and management plan reviewed.

## 2014-08-25 NOTE — Progress Notes (Signed)
Patient ID: [name], female   DOB: 1941-01-04, 74 y.o.   MRN: [id number] S: feels much better after IV lasix for sob yesterday O:BP 125/62 mmHg  Pulse 72  Temp(Src) 98.9 F (37.2 C) (Oral)  Resp 16  Ht 5' 3"  (1.6 m)  Wt 62.687 kg (138 lb 3.2 oz)  BMI 24.49 kg/m2  SpO2 100%  LMP 03/05/2014  Intake/Output Summary (Last 24 hours) at 08/25/14 1200 Last data filed at 08/25/14 1044  Gross per 24 hour  Intake    980 ml  Output   2950 ml  Net  -1970 ml   Intake/Output: I/O last 3 completed shifts: In: 1520 [P.O.:1100; I.V.:420] Out: 3150 [Urine:3150]  Intake/Output this shift:  Total I/O In: 420 [P.O.:420] Out: 800 [Urine:800] Weight change: -2.087 kg (-4 lb 9.6 oz) Gen:WD chronically ill-appearing AAF in NAd CVS:no rub Resp:cta KDT:OIZTIW Ext:tr edema   Recent Labs Lab 08/20/14 1536 08/20/14 1549 08/20/14 2051 08/21/14 0702 08/21/14 1526 08/22/14 0545 08/23/14 0723 08/24/14 0655 08/25/14 0529  NA 133* 135  --  135 133* 136 134* 135 136  K 4.6 4.6  --  5.5* 5.2* 4.1 4.0 3.9 4.6  CL 103 104  --  104 105 104 106 104 105  CO2 20  --   --  17* 19 19 18* 17* 19  GLUCOSE 94 91  --  83 137* 99 86 80 87  BUN 42* 44*  --  41* 45* 48* 45* 42* 44*  CREATININE 2.40* 2.40*  --  2.67* 2.78* 2.88* 2.70* 2.62* 2.68*  ALBUMIN  --   --  3.1*  --   --   --   --  2.9* 2.7*  CALCIUM 8.6  --   --  8.9 8.4 8.5 8.5 8.7 8.7  PHOS  --   --  3.1  --   --   --   --  2.8 2.8  AST  --   --  26  --   --   --   --   --   --   ALT  --   --  20  --   --   --   --   --   --    Liver Function Tests:  Recent Labs Lab 08/20/14 2051 08/24/14 0655 08/25/14 0529  AST 26  --   --   ALT 20  --   --   ALKPHOS 94  --   --   BILITOT 0.7  --   --   PROT 6.0  --   --   ALBUMIN 3.1* 2.9* 2.7*   No results for input(s): LIPASE, AMYLASE in the last 168 hours. No results for input(s): AMMONIA in the last 168 hours. CBC:  Recent Labs Lab 08/20/14 1536  08/21/14 0702 08/22/14 0545  08/23/14 0723 08/24/14 0655  WBC 14.7*  --  19.4* 17.0* 13.9* 13.3*  NEUTROABS 12.0*  --  17.2* 14.5*  --   --   HGB 10.1*  < > 11.2* 9.0* 8.8* 9.4*  HCT 33.1*  < > 36.2 28.5* 28.4* 29.8*  MCV 92.7  --  93.5 92.5 91.6 90.6  PLT 117*  --  100* 104* 117* 141*  < > = values in this interval not displayed. Cardiac Enzymes:  Recent Labs Lab 08/23/14 0723  CKTOTAL 103   CBG: No results for input(s): GLUCAP in the last 168 hours.  Iron Studies: No results for input(s): IRON, TIBC, TRANSFERRIN, FERRITIN in the last  72 hours. Studies/Results: Dg Chest 2 View  08/25/2014   CLINICAL DATA:  Several day history of shortness of breath  EXAM: CHEST  2 VIEW  COMPARISON:  Chest x-ray of August 24, 2014 and August 20, 2014  FINDINGS: The lungs are reasonably well inflated. The interstitial markings remain increased at both lung bases. Confluent density in the left lower lobe persists. Trace amounts of pleural fluid are present. The cardiac silhouette is top-normal in size. The pulmonary vascularity is not engorged. The mediastinum is normal in width. The bony thorax exhibits no acute abnormality.  IMPRESSION: COPD with bibasilar atelectasis or pneumonia. There has been slight improvement on the right since the previous studies, but the findings on the left are more conspicuous.   Electronically Signed   By: David  Martinique   On: 08/25/2014 07:59   Dg Chest 2 View  08/24/2014   CLINICAL DATA:  Pt complains of mid-sternal chest pain, SOB, and loss of appetite for 2 weeks; pt states she has HTN; non-smoker  EXAM: CHEST  2 VIEW  COMPARISON:  08/20/2014  FINDINGS: Since the prior exam, opacity has developed at the left lung base obscuring the left hemidiaphragm on the frontal view. There is also some increased interstitial and patchy airspace opacity at the right lung base and in a perihilar distribution on the right. Minimal right pleural effusion.  Lungs are mildly hyperexpanded.  No evidence of pulmonary edema.   Cardiac silhouette is mildly enlarged. No mediastinal or hilar masses or convincing adenopathy.  IMPRESSION: 1. Worsened lung aeration. 2. New opacity has developed a follow-up lung base, likely pneumonia. Patchy areas of opacity in the right perihilar and lower lung zone have increased, also likely due to infection.   Electronically Signed   By: Lajean Manes M.D.   On: 08/24/2014 08:40   . amiodarone  100 mg Oral BID  . antiseptic oral rinse  7 mL Mouth Rinse BID  . calcitRIOL  0.25 mcg Oral QODAY  . cefTRIAXone (ROCEPHIN)  IV  2 g Intravenous Q24H  . feeding supplement (ENSURE ENLIVE)  237 mL Oral BID BM  . fluticasone  2 spray Each Nare Daily  . furosemide  40 mg Intravenous Q8H  . gabapentin  200 mg Oral QHS  . levothyroxine  100 mcg Oral QAC breakfast  . loratadine  10 mg Oral Daily  . metoprolol  100 mg Oral BID  . mycophenolate  360 mg Oral BID  . pantoprazole  40 mg Oral Daily  . predniSONE  10 mg Oral Daily  . tacrolimus  4 mg Oral QPM  . tacrolimus  5 mg Oral q morning - 10a  . warfarin  2 mg Oral ONCE-1800  . Warfarin - Pharmacist Dosing Inpatient   Does not apply q1800    BMET    Component Value Date/Time   NA 136 08/25/2014 0529   K 4.6 08/25/2014 0529   CL 105 08/25/2014 0529   CO2 19 08/25/2014 0529   GLUCOSE 87 08/25/2014 0529   BUN 44* 08/25/2014 0529   CREATININE 2.68* 08/25/2014 0529   CALCIUM 8.7 08/25/2014 0529   CALCIUM 10.0 02/16/2012 0827   GFRNONAA 17* 08/25/2014 0529   GFRAA 19* 08/25/2014 0529   CBC    Component Value Date/Time   WBC 13.3* 08/24/2014 0655   RBC 3.29* 08/24/2014 0655   RBC 3.87 04/23/2008 1926   HGB 9.4* 08/24/2014 0655   HCT 29.8* 08/24/2014 0655   PLT 141* 08/24/2014 1540  MCV 90.6 08/24/2014 0655   MCH 28.6 08/24/2014 0655   MCHC 31.5 08/24/2014 0655   RDW 15.8* 08/24/2014 0655   LYMPHSABS 0.9 08/22/2014 0545   MONOABS 1.5* 08/22/2014 0545   EOSABS 0.1 08/22/2014 0545   BASOSABS 0.0 08/22/2014 0545      Assessment/Plan:  1. AKI/CKD- Scr has reached a plateau but not far off from her baseline (2.4-2.6).  It had been trending down but rose today likely due to bolus of IV lasix.  Will continue to follow. 2. 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 Continue with prograf/prednisone/cellcept 3. Volume overload- improved with IV lasix 4. HCAP- on rocephin 5. Anemia of chronic disease- cont with ESA (will not accept blood transfusions for religious reasons) 6. Protein malnutrition 7. Metabolic bone disease- cont with vit D and binders  [name] A

## 2014-08-25 NOTE — Progress Notes (Signed)
Subjective:  Continuing to improve with lasix. She felt congested in her chest and could not lie flat because of SOB yesterday but now feels lot better. No cp/n/v/fever/chills. Had good urine output and weight is coming down.  Objective: Vital signs in last 24 hours: Filed Vitals:   08/25/14 1052 08/25/14 1156 08/25/14 1359 08/25/14 2215  BP: 125/62  147/68 113/59  Pulse: 72  69 76  Temp:   97.9 F (36.6 C) 97.7 F (36.5 C)  TempSrc:   Oral   Resp: 16   18  Height:      Weight:      SpO2: 100% 100% 92% 93%   Weight change: -4 lb 9.6 oz (-2.087 kg)  Intake/Output Summary (Last 24 hours) at 08/25/14 2257 Last data filed at 08/25/14 1846  Gross per 24 hour  Intake   1140 ml  Output   1200 ml  Net    -60 ml   Vitals reviewed. General: resting in bed, NAD HEENT: PERRL, EOMI, no scleral icterus Cardiac: RRR, no rubs, murmurs or gallops Pulm: mild crackles on bases. No wheezing. Abd: soft, nontender, nondistended, BS present, surgical hernia on RLQ. Ext: warm and well perfused, no pedal edema Neuro: alert and oriented X3, cranial nerves II-XII grossly intact, strength and sensation to light touch equal in bilateral upper and lower extremities  Lab Results: Basic Metabolic Panel:  Recent Labs Lab 08/21/14 0702  08/22/14 0545  08/24/14 0655 08/25/14 0529  NA 135  < > 136  < > 135 136  K 5.5*  < > 4.1  < > 3.9 4.6  CL 104  < > 104  < > 104 105  CO2 17*  < > 19  < > 17* 19  GLUCOSE 83  < > 99  < > 80 87  BUN 41*  < > 48*  < > 42* 44*  CREATININE 2.67*  < > 2.88*  < > 2.62* 2.68*  CALCIUM 8.9  < > 8.5  < > 8.7 8.7  MG 2.1  --  2.1  --   --   --   PHOS  --   --   --   --  2.8 2.8  < > = values in this interval not displayed. Liver Function Tests:  Recent Labs Lab 08/20/14 2051 08/24/14 0655 08/25/14 0529  AST 26  --   --   ALT 20  --   --   ALKPHOS 94  --   --   BILITOT 0.7  --   --   PROT 6.0  --   --   ALBUMIN 3.1* 2.9* 2.7*   No results for input(s):  LIPASE, AMYLASE in the last 168 hours. No results for input(s): AMMONIA in the last 168 hours. CBC:  Recent Labs Lab 08/21/14 0702 08/22/14 0545 08/23/14 0723 08/24/14 0655  WBC 19.4* 17.0* 13.9* 13.3*  NEUTROABS 17.2* 14.5*  --   --   HGB 11.2* 9.0* 8.8* 9.4*  HCT 36.2 28.5* 28.4* 29.8*  MCV 93.5 92.5 91.6 90.6  PLT 100* 104* 117* 141*  Coagulation:  Recent Labs Lab 08/22/14 0545 08/23/14 0723 08/24/14 0655 08/25/14 0529  LABPROT 44.6* 33.8* 30.6* 28.7*  INR 4.70* 3.29* 2.90* 2.68*   Urinalysis:  Recent Labs Lab 08/20/14 1650 08/23/14 0156  COLORURINE YELLOW YELLOW  LABSPEC 1.021 1.012  PHURINE 5.0 5.0  GLUCOSEU NEGATIVE NEGATIVE  HGBUR NEGATIVE NEGATIVE  BILIRUBINUR NEGATIVE NEGATIVE  KETONESUR NEGATIVE NEGATIVE  PROTEINUR NEGATIVE NEGATIVE  UROBILINOGEN 0.2 0.2  NITRITE NEGATIVE NEGATIVE  LEUKOCYTESUR NEGATIVE NEGATIVE   Micro Results: Recent Results (from the past 240 hour(s))  Blood culture (routine x 2)     Status: None (Preliminary result)   Collection Time: 08/20/14  2:34 PM  Result Value Ref Range Status   Specimen Description BLOOD RIGHT HAND  Final   Special Requests BOTTLES DRAWN AEROBIC ONLY 2CC  Final   Culture   Final           BLOOD CULTURE RECEIVED NO GROWTH TO DATE CULTURE WILL BE HELD FOR 5 DAYS BEFORE ISSUING A FINAL NEGATIVE REPORT Performed at [facility]    Report Status PENDING  Incomplete  Blood culture (routine x 2)     Status: None (Preliminary result)   Collection Time: 08/20/14  3:40 PM  Result Value Ref Range Status   Specimen Description BLOOD HAND LEFT  Final   Special Requests   Final    BOTTLES DRAWN AEROBIC AND ANAEROBIC 10CCBLUE 5CCRED   Culture   Final           BLOOD CULTURE RECEIVED NO GROWTH TO DATE CULTURE WILL BE HELD FOR 5 DAYS BEFORE ISSUING A FINAL NEGATIVE REPORT Performed at [facility]    Report Status PENDING  Incomplete  MRSA PCR Screening     Status: None   Collection Time:  08/20/14  8:45 PM  Result Value Ref Range Status   MRSA by PCR NEGATIVE NEGATIVE Final    Comment:        The GeneXpert MRSA Assay (FDA approved for NASAL specimens only), is one component of a comprehensive MRSA colonization surveillance program. It is not intended to diagnose MRSA infection nor to guide or monitor treatment for MRSA infections.   Culture, expectorated sputum-assessment     Status: None   Collection Time: 08/21/14  3:07 PM  Result Value Ref Range Status   Specimen Description SPUTUM  Final   Special Requests Immunocompromised  Final   Sputum evaluation   Final    THIS SPECIMEN IS ACCEPTABLE. RESPIRATORY CULTURE REPORT TO FOLLOW.   Report Status 08/21/2014 FINAL  Final  Culture, respiratory (NON-Expectorated)     Status: None   Collection Time: 08/21/14  3:07 PM  Result Value Ref Range Status   Specimen Description SPUTUM  Final   Special Requests NONE  Final   Gram Stain   Final    MODERATE WBC PRESENT,BOTH PMN AND MONONUCLEAR MODERATE SQUAMOUS EPITHELIAL CELLS PRESENT MODERATE GRAM POSITIVE COCCI IN PAIRS FEW GRAM NEGATIVE RODS RARE GRAM POSITIVE RODS    Culture   Final    NORMAL OROPHARYNGEAL FLORA Performed at [facility]    Report Status 08/24/2014 FINAL  Final  Clostridium Difficile by PCR     Status: None   Collection Time: 08/22/14  4:52 AM  Result Value Ref Range Status   C difficile by pcr NEGATIVE NEGATIVE Final   Studies/Results: Dg Chest 2 View  08/25/2014   CLINICAL DATA:  Several day history of shortness of breath  EXAM: CHEST  2 VIEW  COMPARISON:  Chest x-ray of August 24, 2014 and August 20, 2014  FINDINGS: The lungs are reasonably well inflated. The interstitial markings remain increased at both lung bases. Confluent density in the left lower lobe persists. Trace amounts of pleural fluid are present. The cardiac silhouette is top-normal in size. The pulmonary vascularity is not engorged. The mediastinum is normal in width. The  bony thorax exhibits no  acute abnormality.  IMPRESSION: COPD with bibasilar atelectasis or pneumonia. There has been slight improvement on the right since the previous studies, but the findings on the left are more conspicuous.   Electronically Signed   By: David  Martinique   On: 08/25/2014 07:59   Dg Chest 2 View  08/24/2014   CLINICAL DATA:  Pt complains of mid-sternal chest pain, SOB, and loss of appetite for 2 weeks; pt states she has HTN; non-smoker  EXAM: CHEST  2 VIEW  COMPARISON:  08/20/2014  FINDINGS: Since the prior exam, opacity has developed at the left lung base obscuring the left hemidiaphragm on the frontal view. There is also some increased interstitial and patchy airspace opacity at the right lung base and in a perihilar distribution on the right. Minimal right pleural effusion.  Lungs are mildly hyperexpanded.  No evidence of pulmonary edema.  Cardiac silhouette is mildly enlarged. No mediastinal or hilar masses or convincing adenopathy.  IMPRESSION: 1. Worsened lung aeration. 2. New opacity has developed a follow-up lung base, likely pneumonia. Patchy areas of opacity in the right perihilar and lower lung zone have increased, also likely due to infection.   Electronically Signed   By: Lajean Manes M.D.   On: 08/24/2014 08:40   Medications: I have reviewed the patient's current medications. Scheduled Meds: . amiodarone  100 mg Oral BID  . antiseptic oral rinse  7 mL Mouth Rinse BID  . calcitRIOL  0.25 mcg Oral QODAY  . cefTRIAXone (ROCEPHIN)  IV  2 g Intravenous Q24H  . feeding supplement (ENSURE ENLIVE)  237 mL Oral BID BM  . fluticasone  2 spray Each Nare Daily  . furosemide  40 mg Intravenous Q8H  . gabapentin  200 mg Oral QHS  . levothyroxine  100 mcg Oral QAC breakfast  . loratadine  10 mg Oral Daily  . metoprolol  100 mg Oral BID  . mycophenolate  360 mg Oral BID  . pantoprazole  40 mg Oral Daily  . predniSONE  10 mg Oral Daily  . tacrolimus  4 mg Oral QPM  . tacrolimus  5  mg Oral q morning - 10a  . Warfarin - Pharmacist Dosing Inpatient   Does not apply q1800   Continuous Infusions:   PRN Meds:.acetaminophen, benzonatate, dextromethorphan-guaiFENesin, diphenhydrAMINE, HYDROcodone-acetaminophen, ipratropium-albuterol, ondansetron (ZOFRAN) IV Assessment/Plan: Principal Problem:   Streptococcal pneumonia Active Problems:   Atrial fibrillation   CKD (chronic kidney disease), stage IV   History of renal transplant   HTN (hypertension)   Hypothyroidism   Crohn's disease   Allergic rhinitis   History of DVT (deep vein thrombosis)   GERD (gastroesophageal reflux disease)   SLE (systemic lupus erythematosus)   OSA (obstructive sleep apnea)   Immunosuppressed status   Sepsis   Anemia of chronic disease   Syncope   Community acquired pneumonia   Pleural effusion, left  74 yo female with kidney transplant CKD IV, on immunosuppresion, here with CAP.  Strep pneum + pneumonia - + strep pneum urine antigen and also sputum culture showing gram + cocci in apirs  -   leukocytosis improved (elevated b/c of prednisone at baseline)  CXR showing RLL infiltrate initially but now showed improvement of that. However, shows left basilar effusion, likely 2/2 to volume overload. She was initially volume depleted as she came in with low PO intake and illness. Given she has CKD 4, she had idiopathic volume overload from IVF admin. - continue lasix 55m q8hr.  - daily i/o, strict  i/os. - continue ceftriaxone will likely switch to ceftin on discharge for total 10days treatment. - f/up sputum cx (GPC in pairs), blood cx ngtd. - o2 sat to keep sat >92%.  - cont duoneb to see if it helps. - tessalon for cough.  Acute hypoxic resp failure - likely 2/2 to volume overload -  - continue diuresis. - reassess ambulatory oxygen tomorrow, repeat CXR tomorrow. - strict i/o's  AKI?on CKD IV -bl gfr around ~20-24.   Has hx of renal cell carcinoma s/p bilat nephrectomies, also hx of HD  in the past, s/p kidney transplant      2006. Chronic immunosuppresion due to this. GFR now at baseline - kidney function worsened initially was likely 2/2 to dehydration, which we corrected with fluid. But now she is volume overloaded. - currently kidney function stable. - monitor BMET. - appreciate nephro recs.  - needs close nephrology follow up with Dr. Jimmy Footman. - cont prograf 5 mg morning, 4 mg evening. - cont prednisone 49m daily (5 mg normally, for recent illness). - cont mycophenolate.  Lonstanding PAF and hx of DVT - on chronic coumadin - cont amio 562mand metoprolol. Echo 2/14 [facility] mild to moderate AR normal EF  - INR was 4.7 even after holding coumadin last few days but now improved to 2.68. This could be 2/2 to ceftriaxone potentiation of coumadin. Hold coumadin. Recheck INR daily.  HTN - cont metoprolol.  Chronic normocytic anemia -- of chronic disease. b/l hgb around 10.  - stable. Monitor. Gets Epo injections every 21 days. Gave Aranesp in hospital 08/22/14.   hypomag - cont mag ox Hypothyroidism - cont levothyroxine GERD - cont ppi.  Dispo: Disposition is deferred at this time, awaiting improvement of current medical problems.  Anticipated discharge in approximately 1-2 day(s).   The patient does have a current PCP (JMauricia AreaMD) and [facility].  The patient does have transportation limitations that hinder transportation to clinic appointments.  .Services Needed at time of discharge: Y = Yes, Blank = No PT:   OT:   RN:   Equipment:   Other:     LOS: 5 days   TaDellia NimsMD 08/25/2014, 10:57 PM

## 2014-08-25 NOTE — Progress Notes (Signed)
Occupational Therapy Treatment Patient Details Name: [name] MRN: [id number] DOB: 1941/02/03 Today's Date: 08/25/2014    History of present illness Admitted with cough, fever, syncope. found to have CAP   OT comments  Pt. Seen for DME, A/E, and energy conservation review.  Discussed pts. Existing DME and how to incorporate safely into home environment ie: shower chair and transfer tech. Also provided information on purchasing A/E and provided energy conservation handouts and reviewed examples of them during her ADLS.  Pt. Had no further questions and is clear for d/c from OT standpoint.    Follow Up Recommendations  No OT follow up    Equipment Recommendations    pt. States she will need a RW and 3n1 if able to have.     Recommendations for Other Services      Precautions / Restrictions Precautions Precautions: Fall Precaution Comments: Watch O2 sats       Mobility Bed Mobility Overal bed mobility: Modified Independent             General bed mobility comments: transitioned from supine to sit duirng session as b.fast tray arrived and she requested to eat sitting eob vs. in recliner  Transfers                      Balance                                   ADL                                         General ADL Comments: met with pt. for contiued education on energy conservation and safe use of DME.  pt. states husband did not take pics but had reported to her she has a walker but it has weather damage from storage building so she needs a new one.  also states she has a shower chair but due to older narrow based tub the chair has to sit sideways.  reviewed tech. for this type of placement in tub and also eduated pt. on benefits of installing a hand held shower head to aide with washing the opposite side of body to the faucet.  reviewed a/e kit in gift shop. pt. eager for husband to purchase that for her.  provided info. for  puchase for her.  provided energy conservation handouts for pt. to review and went over several examples with her as well.  pt. verbalized understanding and states she has no further questions.        Vision                     Perception     Praxis      Cognition   Behavior During Therapy: WFL for tasks assessed/performed Overall Cognitive Status: Within Functional Limits for tasks assessed                       Extremity/Trunk Assessment               Exercises     Shoulder Instructions       General Comments      Pertinent Vitals/ Pain       Pain Assessment: No/denies pain  Home Living  Prior Functioning/Environment              Frequency Min 2X/week     Progress Toward Goals  OT Goals(current goals can now be found in the care plan section)  Progress towards OT goals: Goals met/education completed, patient discharged from [facility] Discharge plan remains appropriate    Co-evaluation                 End of Session Equipment Utilized During Treatment: Oxygen   Activity Tolerance Patient tolerated treatment well   Patient Left in bed;with call bell/phone within reach   Nurse Communication          Time: 3235-5732 OT Time Calculation (min): 25 min  Charges: OT General Charges $OT Visit: 1 Procedure OT Treatments $Self Care/Home Management : 23-37 mins  Janice Coffin, COTA/L 08/25/2014, 9:17 AM   Agree with note Maurie Boettcher, OTR/L  [phone] 08/25/2014

## 2014-08-25 NOTE — Progress Notes (Signed)
Physical Therapy Treatment Patient Details Name: [name] MRN: [id number] DOB: April 01, 1941 Today's Date: 08/25/2014    History of Present Illness Admitted with cough, fever, syncope. found to have CAP    PT Comments    Pt feeling much better today and progressing with mobility. O2 sats in 90's at rest, upper 80's with exertion, on RA. Pt with balance deficits limiting safety, practiced balance exercises in session today and instructed her in performance at home. PT will continue to follow.   Follow Up Recommendations  Home health PT;Supervision - Intermittent     Equipment Recommendations  Rolling walker with 5" wheels    Recommendations for Other Services       Precautions / Restrictions Precautions Precautions: Fall Precaution Comments: Watch O2 sats Restrictions Weight Bearing Restrictions: No    Mobility  Bed Mobility Overal bed mobility: Modified Independent             General bed mobility comments: transitioned from supine to sit duirng session as b.fast tray arrived and she requested to eat sitting eob vs. in recliner  Transfers Overall transfer level: Needs assistance Equipment used: None Transfers: Sit to/from Stand Sit to Stand: Supervision         General transfer comment: no physical assist needed but pt slightly unsteady so close supervision given for potential LOB  Ambulation/Gait Ambulation/Gait assistance: Min assist;Min guard Ambulation Distance (Feet): 200 Feet Assistive device: None;Rolling walker (2 wheeled) Gait Pattern/deviations: Step-through pattern;Drifts right/left Gait velocity: Decreased   General Gait Details: Began ambulation without RW and pt with left LOB with self correction, min A given for short period after this as well as use of RW. Pt with one left LOB with RW as well with self-correction. Balance improved with distance. But pt with overall delayed balance reactions. Education given regarding balance and  safety.   Stairs Stairs: Yes Stairs assistance: Min guard   Number of Stairs: 4 General stair comments: pt requires rail for safety, decreased control on descent, instructed pt to go directly back to using RW from support of stair rail.   Wheelchair Mobility    Modified Rankin (Stroke Patients Only)       Balance Overall balance assessment: Needs assistance Sitting-balance support: Feet supported;No upper extremity supported Sitting balance-Leahy Scale: Good     Standing balance support: No upper extremity supported;During functional activity Standing balance-Leahy Scale: Fair Standing balance comment: pt can maintain standing with eyes open and closed in hip width stance as well as rhomberg stance, however, pt has difficulty achieving and maintain tandem stance without UE support as well as SL stance. Gave pt progression of these positions for exercises at home, eyes open and eyes closed.                     Cognition Arousal/Alertness: Awake/alert Behavior During Therapy: WFL for tasks assessed/performed Overall Cognitive Status: Within Functional Limits for tasks assessed                      Exercises      General Comments General comments (skin integrity, edema, etc.): O2 sats 99% on RA to begin, 87% after ambulation, 2L O2 applied and O2 sats back into 90's, O2 taken back off at rest. Do not anticipate that she will need O2 for home as she keeps improving but will continue to monitor      Pertinent Vitals/Pain Pain Assessment: 0-10 Pain Score: 3  Pain Location: sternum Pain Intervention(s): Monitored  during session;Premedicated before session    Home Living                      Prior Function            PT Goals (current goals can now be found in the care plan section) Acute Rehab PT Goals Patient Stated Goal: to go home (really do not want a walker, but know it will be safer) PT Goal Formulation: With patient/family Time For Goal  Achievement: 08/31/14 Potential to Achieve Goals: Good Progress towards PT goals: Progressing toward goals    Frequency  Min 3X/week    PT Plan Current plan remains appropriate    Co-evaluation             End of Session Equipment Utilized During Treatment: Oxygen Activity Tolerance: Patient tolerated treatment well Patient left: in bed;with call bell/phone within reach     Time: 1153-1225 PT Time Calculation (min) (ACUTE ONLY): 32 min  Charges:  $Gait Training: 8-22 mins $Therapeutic Exercise: 8-22 mins                    G Codes:     Leighton Roach, PT  Acute Rehab Services  [place], [place] 08/25/2014, 1:00 PM

## 2014-08-25 NOTE — Progress Notes (Signed)
Patient refused CPAP for the tonight.  Patient is aware to call RT if she changes her mind.

## 2014-08-25 NOTE — Progress Notes (Signed)
[place] for coumadin Indication: hx DVT and PAF  Allergies  Allergen Reactions  . Amoxicillin Anaphylaxis  . Ampicillin Anaphylaxis  . Ciprofloxacin Swelling    "of my throat"  . Erythromycin Anaphylaxis  . Penicillins Anaphylaxis  . Sulfa Antibiotics Itching  . Sulfamethoxazole-Trimethoprim Itching  . Tetracycline Anaphylaxis  . Labetalol Nausea And Vomiting    Patient Measurements: Height: 5' 3"  (160 cm) Weight: 138 lb 3.2 oz (62.687 kg) IBW/kg (Calculated) : 52.4  Vital Signs: Temp: 98.9 F (37.2 C) (04/18 0550) Temp Source: Oral (04/18 0550) BP: 125/62 mmHg (04/18 1052) Pulse Rate: 72 (04/18 1052)  Labs:  Recent Labs  08/23/14 0723 08/24/14 0655 08/25/14 0529  HGB 8.8* 9.4*  --   HCT 28.4* 29.8*  --   PLT 117* 141*  --   LABPROT 33.8* 30.6* 28.7*  INR 3.29* 2.90* 2.68*  CREATININE 2.70* 2.62* 2.68*  CKTOTAL 103  --   --     Estimated Creatinine Clearance: 15.9 mL/min (by C-G formula based on Cr of 2.68).   Assessment: 47 YOF on warfarin PTA for hx DVT and PAF. Admission INR elevated at 3.34. INR peaked at 4.7, this morning it is now in therapeutic range at 2.68. Vitamin K has also not been given. Received coumadin 73m yesterday. Remains on amiodarone home dose.  Home dose is 2.516malternating with 1.2555mPatient expressed to me that she was halving and then quarter-ing 5mg26mblets. Recommend she is discharged on 2.5mg 13mlets, and to simplify her regimen (ie specific days to take certain doses).  Goal of Therapy:  INR 2-3 Monitor platelets by anticoagulation protocol: Yes   Plan:  -warfarin 2mg p40m1 tonight  -daily PT/INR -Monitor for bleeding complications  [name] Amanda ShapemD, BCPS  Clinical Pharmacist  Pager: [phone]/2016 11:31 AM

## 2014-08-26 ENCOUNTER — Inpatient Hospital Stay (HOSPITAL_COMMUNITY): Payer: Medicare Other

## 2014-08-26 LAB — RENAL FUNCTION PANEL
ALBUMIN: 3.2 g/dL — AB (ref 3.5–5.2)
Anion gap: 12 (ref 5–15)
BUN: 49 mg/dL — ABNORMAL HIGH (ref 6–23)
CALCIUM: 9.3 mg/dL (ref 8.4–10.5)
CO2: 25 mmol/L (ref 19–32)
Chloride: 99 mmol/L (ref 96–112)
Creatinine, Ser: 2.89 mg/dL — ABNORMAL HIGH (ref 0.50–1.10)
GFR calc non Af Amer: 15 mL/min — ABNORMAL LOW (ref >90)
GFR, EST AFRICAN AMERICAN: 18 mL/min — AB (ref >90)
Glucose, Bld: 94 mg/dL (ref 70–99)
POTASSIUM: 4.1 mmol/L (ref 3.5–5.1)
Phosphorus: 3 mg/dL (ref 2.3–4.6)
SODIUM: 136 mmol/L (ref 135–145)

## 2014-08-26 LAB — CBC
HCT: 30.3 % — ABNORMAL LOW (ref 36.0–46.0)
Hemoglobin: 9.3 g/dL — ABNORMAL LOW (ref 12.0–15.0)
MCH: 28 pg (ref 26.0–34.0)
MCHC: 30.7 g/dL (ref 30.0–36.0)
MCV: 91.3 fL (ref 78.0–100.0)
PLATELETS: 181 10*3/uL (ref 150–400)
RBC: 3.32 MIL/uL — ABNORMAL LOW (ref 3.87–5.11)
RDW: 15.7 % — ABNORMAL HIGH (ref 11.5–15.5)
WBC: 13.8 10*3/uL — ABNORMAL HIGH (ref 4.0–10.5)

## 2014-08-26 LAB — PROCALCITONIN: Procalcitonin: 2.68 ng/mL

## 2014-08-26 LAB — PROTIME-INR
INR: 2.13 — AB (ref 0.00–1.49)
Prothrombin Time: 24 seconds — ABNORMAL HIGH (ref 11.6–15.2)

## 2014-08-26 LAB — HEPATITIS C ANTIBODY (REFLEX): HCV AB: NEGATIVE

## 2014-08-26 LAB — CULTURE, BLOOD (ROUTINE X 2): CULTURE: NO GROWTH

## 2014-08-26 MED ORDER — WARFARIN SODIUM 2.5 MG PO TABS: 2.5000 mg | ORAL_TABLET | Freq: Once | ORAL | Status: DC

## 2014-08-26 MED ORDER — WARFARIN SODIUM 2.5 MG PO TABS: 1.2500 mg | ORAL_TABLET | Freq: Once | ORAL | Status: DC

## 2014-08-26 MED ORDER — WARFARIN SODIUM 2.5 MG PO TABS
2.5000 mg | ORAL_TABLET | Freq: Once | ORAL | Status: DC
  Filled 2014-08-26: qty 1

## 2014-08-26 MED ORDER — FLUTICASONE PROPIONATE 50 MCG/ACT NA SUSP: 2.0000 | Freq: Every day | NASAL | Status: DC

## 2014-08-26 MED ORDER — FUROSEMIDE 40 MG PO TABS: 40.0000 mg | ORAL_TABLET | Freq: Every day | ORAL | Status: DC

## 2014-08-26 MED ORDER — CEFTRIAXONE SODIUM IN DEXTROSE 40 MG/ML IV SOLN
2.0000 g | INTRAVENOUS | Status: DC
  Filled 2014-08-26: qty 50

## 2014-08-26 MED ORDER — ENSURE ENLIVE PO LIQD: 237.0000 mL | Freq: Two times a day (BID) | ORAL | Status: DC

## 2014-08-26 MED ORDER — CEFUROXIME AXETIL 500 MG PO TABS: 500.0000 mg | ORAL_TABLET | Freq: Every day | ORAL | Status: DC

## 2014-08-26 MED ORDER — POLYETHYLENE GLYCOL 3350 17 G PO PACK
17.0000 g | PACK | Freq: Every day | ORAL | Status: DC
  Administered 2014-08-26: 17 g via ORAL
  Filled 2014-08-26: qty 1

## 2014-08-26 NOTE — Progress Notes (Signed)
Patient ID: [name], female   DOB: July 26, 1940, 74 y.o.   MRN: [id number] S:feels better O:BP 145/73 mmHg  Pulse 69  Temp(Src) 97.7 F (36.5 C) (Oral)  Resp 18  Ht 5' 3"  (1.6 m)  Wt 61 kg (134 lb 7.7 oz)  BMI 23.83 kg/m2  SpO2 91%  LMP 03/05/2014  Intake/Output Summary (Last 24 hours) at 08/26/14 1214 Last data filed at 08/26/14 0900  Gross per 24 hour  Intake   1437 ml  Output   1400 ml  Net     37 ml   Intake/Output: I/O last 3 completed shifts: In: 4163 [P.O.:1737] Out: 3300 [Urine:3300]  Intake/Output this shift:  Total I/O In: 360 [P.O.:360] Out: 300 [Urine:300] Weight change: -1.687 kg (-3 lb 11.5 oz) Gen:WD WN AAF in NAD CVS:no rub Resp:cta AGT:XMIWOE Ext:no edema   Recent Labs Lab 08/20/14 2051 08/21/14 0702 08/21/14 1526 08/22/14 0545 08/23/14 0723 08/24/14 0655 08/25/14 0529 08/26/14 0747  NA  --  135 133* 136 134* 135 136 136  K  --  5.5* 5.2* 4.1 4.0 3.9 4.6 4.1  CL  --  104 105 104 106 104 105 99  CO2  --  17* 19 19 18* 17* 19 25  GLUCOSE  --  83 137* 99 86 80 87 94  BUN  --  41* 45* 48* 45* 42* 44* 49*  CREATININE  --  2.67* 2.78* 2.88* 2.70* 2.62* 2.68* 2.89*  ALBUMIN 3.1*  --   --   --   --  2.9* 2.7* 3.2*  CALCIUM  --  8.9 8.4 8.5 8.5 8.7 8.7 9.3  PHOS 3.1  --   --   --   --  2.8 2.8 3.0  AST 26  --   --   --   --   --   --   --   ALT 20  --   --   --   --   --   --   --    Liver Function Tests:  Recent Labs Lab 08/20/14 2051 08/24/14 0655 08/25/14 0529 08/26/14 0747  AST 26  --   --   --   ALT 20  --   --   --   ALKPHOS 94  --   --   --   BILITOT 0.7  --   --   --   PROT 6.0  --   --   --   ALBUMIN 3.1* 2.9* 2.7* 3.2*   No results for input(s): LIPASE, AMYLASE in the last 168 hours. No results for input(s): AMMONIA in the last 168 hours. CBC:  Recent Labs Lab 08/20/14 1536  08/21/14 0702 08/22/14 0545 08/23/14 0723 08/24/14 0655 08/26/14 0744  WBC 14.7*  --  19.4* 17.0* 13.9* 13.3* 13.8*  NEUTROABS 12.0*   --  17.2* 14.5*  --   --   --   HGB 10.1*  < > 11.2* 9.0* 8.8* 9.4* 9.3*  HCT 33.1*  < > 36.2 28.5* 28.4* 29.8* 30.3*  MCV 92.7  --  93.5 92.5 91.6 90.6 91.3  PLT 117*  --  100* 104* 117* 141* 181  < > = values in this interval not displayed. Cardiac Enzymes:  Recent Labs Lab 08/23/14 0723  CKTOTAL 103   CBG: No results for input(s): GLUCAP in the last 168 hours.  Iron Studies: No results for input(s): IRON, TIBC, TRANSFERRIN, FERRITIN in the last 72 hours. Studies/Results: Dg Chest 2 View  08/26/2014  CLINICAL DATA:  Dyspnea shortness of breath recent pneumonia possible over hydrated, chronic renal insufficiency with renal transplantation.  EXAM: CHEST  2 VIEW  COMPARISON:  PA and lateral chest x-ray of August 25, 2014  FINDINGS: The right lung is well-expanded. On the left retrocardiac density persists in the hemidiaphragm remains obscured. A small left pleural effusion is present. The pulmonary interstitial markings are mildly increased but have improved slightly since yesterday's study. The cardiac silhouette and pulmonary vascularity are normal. The bony thorax exhibits no acute abnormality.  IMPRESSION: Persistent left lower lobe atelectasis or pneumonia. There is also mild pulmonary interstitial edema which has improved somewhat since yesterday's study.   Electronically Signed   By: David  [place]   On: 08/26/2014 07:58   Dg Chest 2 View  08/25/2014   CLINICAL DATA:  Several day history of shortness of breath  EXAM: CHEST  2 VIEW  COMPARISON:  Chest x-ray of August 24, 2014 and August 20, 2014  FINDINGS: The lungs are reasonably well inflated. The interstitial markings remain increased at both lung bases. Confluent density in the left lower lobe persists. Trace amounts of pleural fluid are present. The cardiac silhouette is top-normal in size. The pulmonary vascularity is not engorged. The mediastinum is normal in width. The bony thorax exhibits no acute abnormality.  IMPRESSION: COPD with  bibasilar atelectasis or pneumonia. There has been slight improvement on the right since the previous studies, but the findings on the left are more conspicuous.   Electronically Signed   By: David  [place]   On: 08/25/2014 07:59   . amiodarone  100 mg Oral BID  . antiseptic oral rinse  7 mL Mouth Rinse BID  . calcitRIOL  0.25 mcg Oral QODAY  . cefTRIAXone (ROCEPHIN)  IV  2 g Intravenous Q24H  . feeding supplement (ENSURE ENLIVE)  237 mL Oral BID BM  . fluticasone  2 spray Each Nare Daily  . furosemide  40 mg Intravenous Q8H  . gabapentin  200 mg Oral QHS  . levothyroxine  100 mcg Oral QAC breakfast  . loratadine  10 mg Oral Daily  . metoprolol  100 mg Oral BID  . mycophenolate  360 mg Oral BID  . pantoprazole  40 mg Oral Daily  . polyethylene glycol  17 g Oral Daily  . predniSONE  10 mg Oral Daily  . tacrolimus  4 mg Oral QPM  . tacrolimus  5 mg Oral q morning - 10a  . Warfarin - Pharmacist Dosing Inpatient   Does not apply q1800    BMET    Component Value Date/Time   NA 136 08/26/2014 0747   K 4.1 08/26/2014 0747   CL 99 08/26/2014 0747   CO2 25 08/26/2014 0747   GLUCOSE 94 08/26/2014 0747   BUN 49* 08/26/2014 0747   CREATININE 2.89* 08/26/2014 0747   CALCIUM 9.3 08/26/2014 0747   CALCIUM 10.0 02/16/2012 0827   GFRNONAA 15* 08/26/2014 0747   GFRAA 18* 08/26/2014 0747   CBC    Component Value Date/Time   WBC 13.8* 08/26/2014 0744   RBC 3.32* 08/26/2014 0744   RBC 3.87 04/23/2008 1926   HGB 9.3* 08/26/2014 0744   HCT 30.3* 08/26/2014 0744   PLT 181 08/26/2014 0744   MCV 91.3 08/26/2014 0744   MCH 28.0 08/26/2014 0744   MCHC 30.7 08/26/2014 0744   RDW 15.7* 08/26/2014 0744   LYMPHSABS 0.9 08/22/2014 0545   MONOABS 1.5* 08/22/2014 0545   EOSABS 0.1 08/22/2014 0545  BASOSABS 0.0 08/22/2014 0545     Assessment/Plan:  1. AKI/CKD- Scr has reached a plateau but not far off from her baseline (2.4-2.6). It had been trending down but rose today likely due to bolus  of IV lasix. Continues to fluctuate but overall she feels well and not much off her baseline.  D/w teaching service and ok for discharge with lasix 38m daily and f/u with Dr. DJimmy Footmanon 09/16/14 at 2:00pm.  Will follow outpt [facility] next week. 2. 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 Continue with prograf/prednisone/cellcept 3. Volume overload- improved with IV lasix 4. HCAP- on rocephin 5. Anemia of chronic disease- cont with ESA (will not accept blood transfusions for religious reasons) 6. Protein malnutrition 7. Metabolic bone disease- cont with vit D and binders Jadriel Saxer A

## 2014-08-26 NOTE — Progress Notes (Signed)
Subjective:  Patient feels lot better today. Was able to sleep at night without getting SOB. No cp, SOB has improved significantly. No fever/chills/n/v/. Feels ready to go home today. Remains on room air without desatting.  Objective: Vital signs in last 24 hours: Filed Vitals:   08/25/14 1156 08/25/14 1359 08/25/14 2215 08/26/14 0456  BP:  147/68 113/59 139/61  Pulse:  69 76 69  Temp:  97.9 F (36.6 C) 97.7 F (36.5 C) 97.7 F (36.5 C)  TempSrc:  Oral  Oral  Resp:   18 18  Height:      Weight:    134 lb 7.7 oz (61 kg)  SpO2: 100% 92% 93% 91%   Weight change: -3 lb 11.5 oz (-1.687 kg)  Intake/Output Summary (Last 24 hours) at 08/26/14 0812 Last data filed at 08/26/14 0500  Gross per 24 hour  Intake   1317 ml  Output   1900 ml  Net   -583 ml   Vitals reviewed. General: resting in bed, NAD HEENT: PERRL, EOMI, no scleral icterus Cardiac: RRR, no rubs, murmurs or gallops Pulm:CTAB today. No wheezing. Abd: soft, nontender, nondistended, BS present, surgical hernia on RLQ. Ext: warm and well perfused, no pedal edema Neuro: alert and oriented X3, cranial nerves II-XII grossly intact, strength and sensation to light touch equal in bilateral upper and lower extremities  Lab Results: Basic Metabolic Panel:  Recent Labs Lab 08/21/14 0702  08/22/14 0545  08/24/14 0655 08/25/14 0529  NA 135  < > 136  < > 135 136  K 5.5*  < > 4.1  < > 3.9 4.6  CL 104  < > 104  < > 104 105  CO2 17*  < > 19  < > 17* 19  GLUCOSE 83  < > 99  < > 80 87  BUN 41*  < > 48*  < > 42* 44*  CREATININE 2.67*  < > 2.88*  < > 2.62* 2.68*  CALCIUM 8.9  < > 8.5  < > 8.7 8.7  MG 2.1  --  2.1  --   --   --   PHOS  --   --   --   --  2.8 2.8  < > = values in this interval not displayed. Liver Function Tests:  Recent Labs Lab 08/20/14 2051 08/24/14 0655 08/25/14 0529  AST 26  --   --   ALT 20  --   --   ALKPHOS 94  --   --   BILITOT 0.7  --   --   PROT 6.0  --   --   ALBUMIN 3.1* 2.9* 2.7*    No results for input(s): LIPASE, AMYLASE in the last 168 hours. No results for input(s): AMMONIA in the last 168 hours. CBC:  Recent Labs Lab 08/21/14 0702 08/22/14 0545  08/24/14 0655 08/26/14 0744  WBC 19.4* 17.0*  < > 13.3* 13.8*  NEUTROABS 17.2* 14.5*  --   --   --   HGB 11.2* 9.0*  < > 9.4* 9.3*  HCT 36.2 28.5*  < > 29.8* 30.3*  MCV 93.5 92.5  < > 90.6 91.3  PLT 100* 104*  < > 141* 181  < > = values in this interval not displayed.Coagulation:  Recent Labs Lab 08/22/14 0545 08/23/14 0723 08/24/14 0655 08/25/14 0529  LABPROT 44.6* 33.8* 30.6* 28.7*  INR 4.70* 3.29* 2.90* 2.68*   Urinalysis:  Recent Labs Lab 08/20/14 1650 08/23/14 0156  COLORURINE YELLOW YELLOW  LABSPEC 1.021 1.012  PHURINE 5.0 5.0  GLUCOSEU NEGATIVE NEGATIVE  HGBUR NEGATIVE NEGATIVE  BILIRUBINUR NEGATIVE NEGATIVE  KETONESUR NEGATIVE NEGATIVE  PROTEINUR NEGATIVE NEGATIVE  UROBILINOGEN 0.2 0.2  NITRITE NEGATIVE NEGATIVE  LEUKOCYTESUR NEGATIVE NEGATIVE   Micro Results: Recent Results (from the past 240 hour(s))  Blood culture (routine x 2)     Status: None (Preliminary result)   Collection Time: 08/20/14  2:34 PM  Result Value Ref Range Status   Specimen Description BLOOD RIGHT HAND  Final   Special Requests BOTTLES DRAWN AEROBIC ONLY 2CC  Final   Culture   Final           BLOOD CULTURE RECEIVED NO GROWTH TO DATE CULTURE WILL BE HELD FOR 5 DAYS BEFORE ISSUING A FINAL NEGATIVE REPORT Performed at [facility]    Report Status PENDING  Incomplete  Blood culture (routine x 2)     Status: None (Preliminary result)   Collection Time: 08/20/14  3:40 PM  Result Value Ref Range Status   Specimen Description BLOOD HAND LEFT  Final   Special Requests   Final    BOTTLES DRAWN AEROBIC AND ANAEROBIC 10CCBLUE 5CCRED   Culture   Final           BLOOD CULTURE RECEIVED NO GROWTH TO DATE CULTURE WILL BE HELD FOR 5 DAYS BEFORE ISSUING A FINAL NEGATIVE REPORT Performed at [facility]    Report Status PENDING  Incomplete  MRSA PCR Screening     Status: None   Collection Time: 08/20/14  8:45 PM  Result Value Ref Range Status   MRSA by PCR NEGATIVE NEGATIVE Final    Comment:        The GeneXpert MRSA Assay (FDA approved for NASAL specimens only), is one component of a comprehensive MRSA colonization surveillance program. It is not intended to diagnose MRSA infection nor to guide or monitor treatment for MRSA infections.   Culture, expectorated sputum-assessment     Status: None   Collection Time: 08/21/14  3:07 PM  Result Value Ref Range Status   Specimen Description SPUTUM  Final   Special Requests Immunocompromised  Final   Sputum evaluation   Final    THIS SPECIMEN IS ACCEPTABLE. RESPIRATORY CULTURE REPORT TO FOLLOW.   Report Status 08/21/2014 FINAL  Final  Culture, respiratory (NON-Expectorated)     Status: None   Collection Time: 08/21/14  3:07 PM  Result Value Ref Range Status   Specimen Description SPUTUM  Final   Special Requests NONE  Final   Gram Stain   Final    MODERATE WBC PRESENT,BOTH PMN AND MONONUCLEAR MODERATE SQUAMOUS EPITHELIAL CELLS PRESENT MODERATE GRAM POSITIVE COCCI IN PAIRS FEW GRAM NEGATIVE RODS RARE GRAM POSITIVE RODS    Culture   Final    NORMAL OROPHARYNGEAL FLORA Performed at [facility]    Report Status 08/24/2014 FINAL  Final  Clostridium Difficile by PCR     Status: None   Collection Time: 08/22/14  4:52 AM  Result Value Ref Range Status   C difficile by pcr NEGATIVE NEGATIVE Final   Studies/Results: Dg Chest 2 View  08/26/2014   CLINICAL DATA:  Dyspnea shortness of breath recent pneumonia possible over hydrated, chronic renal insufficiency with renal transplantation.  EXAM: CHEST  2 VIEW  COMPARISON:  PA and lateral chest x-ray of August 25, 2014  FINDINGS: The right lung is well-expanded. On the left retrocardiac density persists in the hemidiaphragm remains obscured. A small left pleural  effusion  is present. The pulmonary interstitial markings are mildly increased but have improved slightly since yesterday's study. The cardiac silhouette and pulmonary vascularity are normal. The bony thorax exhibits no acute abnormality.  IMPRESSION: Persistent left lower lobe atelectasis or pneumonia. There is also mild pulmonary interstitial edema which has improved somewhat since yesterday's study.   Electronically Signed   By: David  Martinique   On: 08/26/2014 07:58   Dg Chest 2 View  08/25/2014   CLINICAL DATA:  Several day history of shortness of breath  EXAM: CHEST  2 VIEW  COMPARISON:  Chest x-ray of August 24, 2014 and August 20, 2014  FINDINGS: The lungs are reasonably well inflated. The interstitial markings remain increased at both lung bases. Confluent density in the left lower lobe persists. Trace amounts of pleural fluid are present. The cardiac silhouette is top-normal in size. The pulmonary vascularity is not engorged. The mediastinum is normal in width. The bony thorax exhibits no acute abnormality.  IMPRESSION: COPD with bibasilar atelectasis or pneumonia. There has been slight improvement on the right since the previous studies, but the findings on the left are more conspicuous.   Electronically Signed   By: David  Martinique   On: 08/25/2014 07:59   Dg Chest 2 View  08/24/2014   CLINICAL DATA:  Pt complains of mid-sternal chest pain, SOB, and loss of appetite for 2 weeks; pt states she has HTN; non-smoker  EXAM: CHEST  2 VIEW  COMPARISON:  08/20/2014  FINDINGS: Since the prior exam, opacity has developed at the left lung base obscuring the left hemidiaphragm on the frontal view. There is also some increased interstitial and patchy airspace opacity at the right lung base and in a perihilar distribution on the right. Minimal right pleural effusion.  Lungs are mildly hyperexpanded.  No evidence of pulmonary edema.  Cardiac silhouette is mildly enlarged. No mediastinal or hilar masses or convincing adenopathy.   IMPRESSION: 1. Worsened lung aeration. 2. New opacity has developed a follow-up lung base, likely pneumonia. Patchy areas of opacity in the right perihilar and lower lung zone have increased, also likely due to infection.   Electronically Signed   By: Lajean Manes M.D.   On: 08/24/2014 08:40   Medications: I have reviewed the patient's current medications. Scheduled Meds: . amiodarone  100 mg Oral BID  . antiseptic oral rinse  7 mL Mouth Rinse BID  . calcitRIOL  0.25 mcg Oral QODAY  . cefTRIAXone (ROCEPHIN)  IV  2 g Intravenous Q24H  . feeding supplement (ENSURE ENLIVE)  237 mL Oral BID BM  . fluticasone  2 spray Each Nare Daily  . furosemide  40 mg Intravenous Q8H  . gabapentin  200 mg Oral QHS  . levothyroxine  100 mcg Oral QAC breakfast  . loratadine  10 mg Oral Daily  . metoprolol  100 mg Oral BID  . mycophenolate  360 mg Oral BID  . pantoprazole  40 mg Oral Daily  . predniSONE  10 mg Oral Daily  . tacrolimus  4 mg Oral QPM  . tacrolimus  5 mg Oral q morning - 10a  . Warfarin - Pharmacist Dosing Inpatient   Does not apply q1800   Continuous Infusions:   PRN Meds:.acetaminophen, benzonatate, dextromethorphan-guaiFENesin, HYDROcodone-acetaminophen, ipratropium-albuterol, ondansetron (ZOFRAN) IV, zolpidem Assessment/Plan: Principal Problem:   Streptococcal pneumonia Active Problems:   Atrial fibrillation   CKD (chronic kidney disease), stage IV   History of renal transplant   HTN (hypertension)   Hypothyroidism  Crohn's disease   Allergic rhinitis   History of DVT (deep vein thrombosis)   GERD (gastroesophageal reflux disease)   SLE (systemic lupus erythematosus)   OSA (obstructive sleep apnea)   Immunosuppressed status   Sepsis   Anemia of chronic disease   Syncope   Community acquired pneumonia   Pleural effusion, left  74 yo female with kidney transplant CKD IV, on immunosuppresion, here with CAP.  Strep pneum + pneumonia - + strep pneum urine antigen and also  gram strain showing GPC in apirs  -   leukocytosis improved (elevated b/c of prednisone at baseline), CXR showed RLL infiltrate initially but now showed improvement of that. However, shows left basilar effusion, likely 2/2 to volume overload - it has improved in serial CXR's with diuresis. She was initially volume depleted as she came in with low PO intake and illness. Given she has CKD 4, she had idiopathic volume overload from IVF admin. - was on lasix 32m q8hr. Will d/c home with lasix 467mdaily.will cont this until sees Dr. DeJimmy Footmanor f/up.  - daily i/o, strict i/os. - received 7 days of ceftriaxone. Will send home 3 more days of ceftin for total 10 days.  - f/up sputum cx (GPC in pairs), blood cx ngtd. - satting fine on room air now, even with ambulation.  - cont duoneb  Acute hypoxic resp failure -resolved. likely 2/2 to volume overload as discussed above.  - CXR showed improvement of of LLL effusion with diruesis.  - cont lasix 402mo daily for now on discharge.  AKI?on CKD IV -bl gfr around ~20-24.   Has hx of renal cell carcinoma s/p bilat nephrectomies, also hx of HD in the past, s/p kidney transplant 2006. Chronic immunosuppresion due to this. GFR now at baseline - kidney function worsened initially was likely 2/2 to dehydration, which we corrected with fluid. It's stable currently. - currently kidney function stable. - appreciate nephro recs.  needs close nephrology follow up with Dr. DetJimmy Footman cont prograf 5 mg morning, 4 mg evening. - received prednisone 56m98mily for current illness. On 5 mg normally at home. will switch back to home regimen on discharge. - cont mycophenolate.  Lonstanding PAF and hx of DVT - on chronic coumadin - cont amio 50mg64m metoprolol. Echo 2/14 [facility] mild to moderate AR normal EF  - INR was 4.7 even after holding coumadin last few days but now improved. This could be 2/2 to ceftriaxone potentiation of coumadin.  - INR today 2.13. Will  send out with 1.25mg 71madin daily ( was taking 2.5 and 1.25mg a102mnatively).  Asked her to check INR in 1 week.   HTN - cont metoprolol.  Chronic normocytic anemia -- of chronic disease. b/l hgb around 10.  - stable. Monitor. Gets Epo injections every 21 days. Gave Aranesp in hospital 08/22/14.   hypomag - cont mag ox Hypothyroidism - cont levothyroxine GERD - cont ppi.  Dispo: Disposition is deferred at this time, awaiting improvement of current medical problems.  Anticipated discharge in approximately 1-2 day(s).   The patient does have a current PCP ([facility] Mauricia [facility].  The patient does have transportation limitations that hinder transportation to clinic appointments.  .Services Needed at time of discharge: Y = Yes, Blank = No PT:   OT:   RN:   Equipment:   Other:     LOS: 6 days   Kerin Kren Dellia Nims19/2016, 8:12  AM

## 2014-08-26 NOTE — Progress Notes (Signed)
NURSING PROGRESS NOTE  MAYA ARCAND [id number] Discharge Data: 08/26/2014 3:04 PM Attending Provider: Annia Belt, MD XNT:[id number],HQPRF Carlean Jews, MD     Brendia Sacks to be D/C'd Home per MD order.  Discussed with the patient the After Visit Summary and all questions fully answered. All IV's discontinued with no bleeding noted. All belongings returned to patient for patient to take home.   Last Vital Signs:  Blood pressure 128/57, pulse 72, temperature 98.2 F (36.8 C), temperature source Oral, resp. rate 20, height 5' 3"  (1.6 m), weight 61 kg (134 lb 7.7 oz), last menstrual period 03/05/2014, SpO2 96 %.  Discharge Medication List   Medication List    TAKE these medications        acetaminophen 325 MG tablet  Commonly known as:  TYLENOL  Take 650 mg by mouth every 6 (six) hours as needed for mild pain.     amiodarone 200 MG tablet  Commonly known as:  PACERONE  Take 100 mg by mouth 2 (two) times daily.     calcitRIOL 0.25 MCG capsule  Commonly known as:  ROCALTROL  Take 0.25 mcg by mouth every other day.     cefUROXime 500 MG tablet  Commonly known as:  CEFTIN  Take 1 tablet (500 mg total) by mouth daily.     dextromethorphan-guaiFENesin 30-600 MG per 12 hr tablet  Commonly known as:  MUCINEX DM  Take 1 tablet by mouth 2 (two) times daily.     epoetin alfa 40000 UNIT/ML injection  Commonly known as:  EPOGEN,PROCRIT  Inject 40,000 Units into the skin every 21 ( twenty-one) days. Due Thursday, 08/21/14     feeding supplement (ENSURE ENLIVE) Liqd  Take 237 mLs by mouth 2 (two) times daily between meals.     fluticasone 50 MCG/ACT nasal spray  Commonly known as:  FLONASE  Place 2 sprays into both nostrils daily.     furosemide 40 MG tablet  Commonly known as:  LASIX  Take 1 tablet (40 mg total) by mouth daily.     gabapentin 100 MG capsule  Commonly known as:  NEURONTIN  Take 200 mg by mouth at bedtime.     levothyroxine 100 MCG tablet  Commonly known as:   SYNTHROID, LEVOTHROID  Take 100 mcg by mouth daily.     MAGNESIUM-OXIDE 400 (241.3 MG) MG tablet  Generic drug:  magnesium oxide  Take 400 mg by mouth 2 (two) times daily.     metoprolol 100 MG tablet  Commonly known as:  LOPRESSOR  Take 100 mg by mouth 2 (two) times daily.     mycophenolate 180 MG EC tablet  Commonly known as:  MYFORTIC  Take 360 mg by mouth 2 (two) times daily.     omeprazole 20 MG capsule  Commonly known as:  PRILOSEC  Take 20 mg by mouth daily.     polyethylene glycol packet  Commonly known as:  MIRALAX / GLYCOLAX  Take 17 g by mouth 2 (two) times daily as needed. For constipation     polyvinyl alcohol-povidone 1.4-0.6 % ophthalmic solution  Commonly known as:  HYPOTEARS  Place 1-2 drops into both eyes 4 (four) times daily as needed (dry eyes).     predniSONE 5 MG tablet  Commonly known as:  DELTASONE  Take 5 mg by mouth daily.     tacrolimus 5 MG capsule  Commonly known as:  PROGRAF  Take 5 mg by mouth every morning.     tacrolimus  1 MG capsule  Commonly known as:  PROGRAF  Take 4 mg by mouth every evening.     warfarin 2.5 MG tablet  Commonly known as:  COUMADIN  Take 0.5 tablets (1.25 mg total) by mouth one time only at 6 PM.         Wallie Renshaw, RN

## 2014-08-26 NOTE — Progress Notes (Signed)
Physical Therapy Treatment Patient Details Name: [name] MRN: [id number] DOB: 02/02/1941 Today's Date: 08/26/2014    History of Present Illness Admitted with cough, fever, syncope. found to have CAP    PT Comments    Progressing well.  Some gait instability that may cause some safety issues more out in the community.  Will try a cane next session.  Follow Up Recommendations  Home health PT;Supervision - Intermittent     Equipment Recommendations       Recommendations for Other Services       Precautions / Restrictions Precautions Precautions: Fall Precaution Comments: Watch O2 sats Restrictions Weight Bearing Restrictions: No    Mobility  Bed Mobility Overal bed mobility: Modified Independent                Transfers Overall transfer level: Modified independent                  Ambulation/Gait Ambulation/Gait assistance: Supervision Ambulation Distance (Feet): 300 Feet Assistive device: None Gait Pattern/deviations: Step-through pattern Gait velocity: Decreased Gait velocity interpretation: Below normal speed for age/gender General Gait Details: generally steady, but occasional episodes of unsteadiness with scissoring or mild wandering to maintain balance.   Stairs            Wheelchair Mobility    Modified Rankin (Stroke Patients Only)       Balance Overall balance assessment: Needs assistance Sitting-balance support: No upper extremity supported Sitting balance-Leahy Scale: Good       Standing balance-Leahy Scale: Fair                      Cognition Arousal/Alertness: Awake/alert Behavior During Therapy: WFL for tasks assessed/performed Overall Cognitive Status: Within Functional Limits for tasks assessed                      Exercises General Exercises - Lower Extremity Hip ABduction/ADduction: AROM;Strengthening;Both;10 reps;Standing Hip Flexion/Marching: AROM;Strengthening;Both;10  reps;Standing Toe Raises: AROM;Strengthening;Both;10 reps;Standing Heel Raises: AROM;Both;10 reps;Standing Mini-Sqauts: AROM;Both;10 reps;Standing Other Exercises Other Exercises: modified pushups in standing x 10 reps.    General Comments General comments (skin integrity, edema, etc.): sats at 95% on RA at rest; 92% on RA during exercise; 89-91% on RA during gait;  EHR 70's/80's      Pertinent Vitals/Pain Pain Assessment: No/denies pain    Home Living                      Prior Function            PT Goals (current goals can now be found in the care plan section) Acute Rehab PT Goals Patient Stated Goal: to go home (really do not want a walker, but know it will be safer) PT Goal Formulation: With patient/family Time For Goal Achievement: 08/31/14 Potential to Achieve Goals: Good Progress towards PT goals: Progressing toward goals    Frequency  Min 3X/week    PT Plan Current plan remains appropriate    Co-evaluation             End of Session   Activity Tolerance: Patient tolerated treatment well Patient left: in bed;with call bell/phone within reach     Time: 1045-1109 PT Time Calculation (min) (ACUTE ONLY): 24 min  Charges:  $Gait Training: 8-22 mins $Therapeutic Exercise: 8-22 mins                    G Codes:  Armaan Pond, Tessie Fass 08/26/2014, 11:21 AM 08/26/2014  Donnella Sham, PT [phone]  (pager)

## 2014-08-26 NOTE — Progress Notes (Signed)
[facility] for coumadin Indication: hx DVT and PAF  Allergies  Allergen Reactions  . Amoxicillin Anaphylaxis  . Ampicillin Anaphylaxis  . Ciprofloxacin Swelling    "of my throat"  . Erythromycin Anaphylaxis  . Penicillins Anaphylaxis  . Sulfa Antibiotics Itching  . Sulfamethoxazole-Trimethoprim Itching  . Tetracycline Anaphylaxis  . Labetalol Nausea And Vomiting    Patient Measurements: Height: 5' 3"  (160 cm) Weight: 134 lb 7.7 oz (61 kg) IBW/kg (Calculated) : 52.4  Vital Signs: Temp: 98.2 F (36.8 C) (04/19 1343) Temp Source: Oral (04/19 1343) BP: 128/57 mmHg (04/19 1343) Pulse Rate: 72 (04/19 1343)  Labs:  Recent Labs  08/24/14 0655 08/25/14 0529 08/26/14 0744 08/26/14 0747  HGB 9.4*  --  9.3*  --   HCT 29.8*  --  30.3*  --   PLT 141*  --  181  --   LABPROT 30.6* 28.7* 24.0*  --   INR 2.90* 2.68* 2.13*  --   CREATININE 2.62* 2.68*  --  2.89*    Estimated Creatinine Clearance: 14.8 mL/min (by C-G formula based on Cr of 2.89).   Assessment: 12 YOF on warfarin PTA for hx DVT and PAF. Admission INR elevated at 3.34. INR peaked at 4.7. INR down to 2.13 today. Remains on amiodarone home dose.  Home dose is 2.84m alternating with 1.244m Patient expressed to me that she was halving and then quarter-ing 53m67mablets. Recommend she is discharged on 2.53mg41mblets  Goal of Therapy:  INR 2-3 Monitor platelets by anticoagulation protocol: Yes   Plan:  -warfarin 2.5 mg po x1 tonight  -daily PT/INR -Monitor for bleeding complications  BenjAlbertina ParrarmD., BCPS Clinical Pharmacist Pager [phone]

## 2014-08-27 DIAGNOSIS — K5090 Crohn's disease, unspecified, without complications: Secondary | ICD-10-CM | POA: Diagnosis not present

## 2014-08-27 DIAGNOSIS — K219 Gastro-esophageal reflux disease without esophagitis: Secondary | ICD-10-CM | POA: Diagnosis not present

## 2014-08-27 DIAGNOSIS — Z85528 Personal history of other malignant neoplasm of kidney: Secondary | ICD-10-CM | POA: Diagnosis not present

## 2014-08-27 DIAGNOSIS — Z86718 Personal history of other venous thrombosis and embolism: Secondary | ICD-10-CM | POA: Diagnosis not present

## 2014-08-27 DIAGNOSIS — I129 Hypertensive chronic kidney disease with stage 1 through stage 4 chronic kidney disease, or unspecified chronic kidney disease: Secondary | ICD-10-CM | POA: Diagnosis not present

## 2014-08-27 DIAGNOSIS — I480 Paroxysmal atrial fibrillation: Secondary | ICD-10-CM | POA: Diagnosis not present

## 2014-08-27 DIAGNOSIS — M329 Systemic lupus erythematosus, unspecified: Secondary | ICD-10-CM | POA: Diagnosis not present

## 2014-08-27 DIAGNOSIS — J154 Pneumonia due to other streptococci: Secondary | ICD-10-CM | POA: Diagnosis not present

## 2014-08-27 DIAGNOSIS — Z940 Kidney transplant status: Secondary | ICD-10-CM | POA: Diagnosis not present

## 2014-08-27 DIAGNOSIS — E039 Hypothyroidism, unspecified: Secondary | ICD-10-CM | POA: Diagnosis not present

## 2014-08-27 DIAGNOSIS — N184 Chronic kidney disease, stage 4 (severe): Secondary | ICD-10-CM | POA: Diagnosis not present

## 2014-08-27 LAB — CULTURE, BLOOD (ROUTINE X 2): CULTURE: NO GROWTH

## 2014-08-29 DIAGNOSIS — M10472 Other secondary gout, left ankle and foot: Secondary | ICD-10-CM | POA: Diagnosis not present

## 2014-09-02 DIAGNOSIS — I82409 Acute embolism and thrombosis of unspecified deep veins of unspecified lower extremity: Secondary | ICD-10-CM | POA: Diagnosis not present

## 2014-09-03 DIAGNOSIS — I480 Paroxysmal atrial fibrillation: Secondary | ICD-10-CM | POA: Diagnosis not present

## 2014-09-03 DIAGNOSIS — N184 Chronic kidney disease, stage 4 (severe): Secondary | ICD-10-CM | POA: Diagnosis not present

## 2014-09-03 DIAGNOSIS — J154 Pneumonia due to other streptococci: Secondary | ICD-10-CM | POA: Diagnosis not present

## 2014-09-03 DIAGNOSIS — M329 Systemic lupus erythematosus, unspecified: Secondary | ICD-10-CM | POA: Diagnosis not present

## 2014-09-03 DIAGNOSIS — K5090 Crohn's disease, unspecified, without complications: Secondary | ICD-10-CM | POA: Diagnosis not present

## 2014-09-03 DIAGNOSIS — I129 Hypertensive chronic kidney disease with stage 1 through stage 4 chronic kidney disease, or unspecified chronic kidney disease: Secondary | ICD-10-CM | POA: Diagnosis not present

## 2014-09-05 DIAGNOSIS — I129 Hypertensive chronic kidney disease with stage 1 through stage 4 chronic kidney disease, or unspecified chronic kidney disease: Secondary | ICD-10-CM | POA: Diagnosis not present

## 2014-09-05 DIAGNOSIS — M329 Systemic lupus erythematosus, unspecified: Secondary | ICD-10-CM | POA: Diagnosis not present

## 2014-09-05 DIAGNOSIS — K5090 Crohn's disease, unspecified, without complications: Secondary | ICD-10-CM | POA: Diagnosis not present

## 2014-09-05 DIAGNOSIS — I480 Paroxysmal atrial fibrillation: Secondary | ICD-10-CM | POA: Diagnosis not present

## 2014-09-05 DIAGNOSIS — J154 Pneumonia due to other streptococci: Secondary | ICD-10-CM | POA: Diagnosis not present

## 2014-09-05 DIAGNOSIS — N184 Chronic kidney disease, stage 4 (severe): Secondary | ICD-10-CM | POA: Diagnosis not present

## 2014-09-08 DIAGNOSIS — I129 Hypertensive chronic kidney disease with stage 1 through stage 4 chronic kidney disease, or unspecified chronic kidney disease: Secondary | ICD-10-CM | POA: Diagnosis not present

## 2014-09-08 DIAGNOSIS — I480 Paroxysmal atrial fibrillation: Secondary | ICD-10-CM | POA: Diagnosis not present

## 2014-09-08 DIAGNOSIS — N184 Chronic kidney disease, stage 4 (severe): Secondary | ICD-10-CM | POA: Diagnosis not present

## 2014-09-08 DIAGNOSIS — K5090 Crohn's disease, unspecified, without complications: Secondary | ICD-10-CM | POA: Diagnosis not present

## 2014-09-08 DIAGNOSIS — J154 Pneumonia due to other streptococci: Secondary | ICD-10-CM | POA: Diagnosis not present

## 2014-09-08 DIAGNOSIS — M329 Systemic lupus erythematosus, unspecified: Secondary | ICD-10-CM | POA: Diagnosis not present

## 2014-09-12 DIAGNOSIS — K5090 Crohn's disease, unspecified, without complications: Secondary | ICD-10-CM | POA: Diagnosis not present

## 2014-09-12 DIAGNOSIS — K5902 Outlet dysfunction constipation: Secondary | ICD-10-CM | POA: Diagnosis not present

## 2014-09-16 DIAGNOSIS — I480 Paroxysmal atrial fibrillation: Secondary | ICD-10-CM | POA: Diagnosis not present

## 2014-09-16 DIAGNOSIS — K5090 Crohn's disease, unspecified, without complications: Secondary | ICD-10-CM | POA: Diagnosis not present

## 2014-09-16 DIAGNOSIS — D631 Anemia in chronic kidney disease: Secondary | ICD-10-CM | POA: Diagnosis not present

## 2014-09-16 DIAGNOSIS — N2581 Secondary hyperparathyroidism of renal origin: Secondary | ICD-10-CM | POA: Diagnosis not present

## 2014-09-16 DIAGNOSIS — I129 Hypertensive chronic kidney disease with stage 1 through stage 4 chronic kidney disease, or unspecified chronic kidney disease: Secondary | ICD-10-CM | POA: Diagnosis not present

## 2014-09-16 DIAGNOSIS — M109 Gout, unspecified: Secondary | ICD-10-CM | POA: Diagnosis not present

## 2014-09-16 DIAGNOSIS — Z86718 Personal history of other venous thrombosis and embolism: Secondary | ICD-10-CM | POA: Diagnosis not present

## 2014-09-16 DIAGNOSIS — J154 Pneumonia due to other streptococci: Secondary | ICD-10-CM | POA: Diagnosis not present

## 2014-09-16 DIAGNOSIS — M329 Systemic lupus erythematosus, unspecified: Secondary | ICD-10-CM | POA: Diagnosis not present

## 2014-09-16 DIAGNOSIS — Z940 Kidney transplant status: Secondary | ICD-10-CM | POA: Diagnosis not present

## 2014-09-16 DIAGNOSIS — N184 Chronic kidney disease, stage 4 (severe): Secondary | ICD-10-CM | POA: Diagnosis not present

## 2014-09-18 ENCOUNTER — Encounter (HOSPITAL_COMMUNITY)
Admission: RE | Admit: 2014-09-18 | Discharge: 2014-09-18 | Disposition: A | Discharge status: Home / Self Care | Payer: Medicare Other | Source: Ambulatory Visit | Attending: Nephrology | Admitting: Nephrology

## 2014-09-18 DIAGNOSIS — N183 Chronic kidney disease, stage 3 (moderate): Secondary | ICD-10-CM | POA: Insufficient documentation

## 2014-09-18 DIAGNOSIS — D631 Anemia in chronic kidney disease: Secondary | ICD-10-CM | POA: Diagnosis not present

## 2014-09-18 LAB — POCT HEMOGLOBIN-HEMACUE: HEMOGLOBIN: 9.4 g/dL — AB (ref 12.0–15.0)

## 2014-09-18 MED ORDER — EPOETIN ALFA 10000 UNIT/ML IJ SOLN: 40000.0000 [IU] | INTRAMUSCULAR | Status: DC

## 2014-09-18 MED ORDER — EPOETIN ALFA 40000 UNIT/ML IJ SOLN
INTRAMUSCULAR | Status: AC
  Administered 2014-09-18: 40000 [IU] via SUBCUTANEOUS
  Filled 2014-09-18: qty 1

## 2014-09-19 DIAGNOSIS — K5090 Crohn's disease, unspecified, without complications: Secondary | ICD-10-CM | POA: Diagnosis not present

## 2014-09-19 DIAGNOSIS — N184 Chronic kidney disease, stage 4 (severe): Secondary | ICD-10-CM | POA: Diagnosis not present

## 2014-09-19 DIAGNOSIS — Z86718 Personal history of other venous thrombosis and embolism: Secondary | ICD-10-CM | POA: Diagnosis not present

## 2014-09-19 DIAGNOSIS — M329 Systemic lupus erythematosus, unspecified: Secondary | ICD-10-CM | POA: Diagnosis not present

## 2014-09-19 DIAGNOSIS — J154 Pneumonia due to other streptococci: Secondary | ICD-10-CM | POA: Diagnosis not present

## 2014-09-19 DIAGNOSIS — I129 Hypertensive chronic kidney disease with stage 1 through stage 4 chronic kidney disease, or unspecified chronic kidney disease: Secondary | ICD-10-CM | POA: Diagnosis not present

## 2014-09-19 DIAGNOSIS — I480 Paroxysmal atrial fibrillation: Secondary | ICD-10-CM | POA: Diagnosis not present

## 2014-09-22 DIAGNOSIS — J154 Pneumonia due to other streptococci: Secondary | ICD-10-CM | POA: Diagnosis not present

## 2014-09-22 DIAGNOSIS — K5090 Crohn's disease, unspecified, without complications: Secondary | ICD-10-CM | POA: Diagnosis not present

## 2014-09-22 DIAGNOSIS — I480 Paroxysmal atrial fibrillation: Secondary | ICD-10-CM | POA: Diagnosis not present

## 2014-09-22 DIAGNOSIS — I129 Hypertensive chronic kidney disease with stage 1 through stage 4 chronic kidney disease, or unspecified chronic kidney disease: Secondary | ICD-10-CM | POA: Diagnosis not present

## 2014-09-22 DIAGNOSIS — N184 Chronic kidney disease, stage 4 (severe): Secondary | ICD-10-CM | POA: Diagnosis not present

## 2014-09-22 DIAGNOSIS — M329 Systemic lupus erythematosus, unspecified: Secondary | ICD-10-CM | POA: Diagnosis not present

## 2014-09-23 ENCOUNTER — Other Ambulatory Visit: Payer: Self-pay | Admitting: Internal Medicine

## 2014-09-23 NOTE — Progress Notes (Signed)
Patient ID: [name], female   DOB: [birth year], 74 y.o.   MRN: [id number] 74 y.o.  with HTN and PAF Previously seen by [facility] and most recently by [facility] Dr Matthew Saras. Reviewed over 35 pages of records. Longstanding PAF and history of DVT On chronic coumadin. CRF with previous dialysis and transplant 2006 Baseline Cr 2.1 She is on low dose amiodarone 50 bid noted labs 1/14 wit suppressed TSH .24 ( LLN .34) She has been maint NSR with no dyspnea chest pain or palpitations No echo's but patient indicates normal LV function. She uses Dr Dederding as primary.   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 Wall thickness was increased in a pattern of moderate LVH. - Aortic valve: There was mild regurgitation. - Mitral valve: Calcified annulus. There was mild regurgitation. - Left atrium: The atrium was mildly dilated. - Atrial septum: No defect or patent foramen ovale was identified.  Recent pneumonia with elevated Cr with dehydration Improved post hospital d/c Episode of gout in feet improved now      ROS: Denies fever, malais, weight loss, blurry vision, decreased visual acuity, cough, sputum, SOB, hemoptysis, pleuritic pain, palpitaitons, heartburn, abdominal pain, melena, lower extremity edema, claudication, or rash.  All other systems reviewed and negative  General: Affect appropriate Mildly cushingoid  HEENT: normal Neck supple with no adenopathy JVP normal no bruits no thyromegaly Lungs clear with no wheezing and good diaphragmatic motion Heart:  S1/S2 AR  murmur, no rub, gallop or click PMI normal Abdomen: benighn, BS positve, no tenderness, no AAA no bruit.  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  allopurinol (ZYLOPRIM) 100 MG tablet Take 1 tablet by mouth for 1 week. There after take 2 tablets daily    . amiodarone (PACERONE) 100 MG tablet Take 0.5 tablets by mouth 2 (two) times daily.  0  . acetaminophen (TYLENOL) 325 MG tablet Take 650 mg by mouth every 6 (six) hours as needed for mild pain.    . calcitRIOL (ROCALTROL) 0.25 MCG capsule Take 0.25 mcg by mouth every other day.     Marland Kitchen dextromethorphan-guaiFENesin (MUCINEX DM) 30-600 MG per 12 hr tablet Take 1 tablet by mouth 2 (two) times daily.    Marland Kitchen epoetin alfa (EPOGEN,PROCRIT) 11941 UNIT/ML injection Inject 40,000 Units into the skin every 21 ( twenty-one) days. Due Thursday, 08/21/14    . feeding supplement, ENSURE ENLIVE, (ENSURE ENLIVE) LIQD Take 237 mLs by mouth 2 (two) times daily between meals. 60 Bottle 12  . fluticasone (FLONASE) 50 MCG/ACT nasal spray Place 2 sprays into both nostrils daily. 16 g 2  . gabapentin (NEURONTIN) 100 MG capsule Take 200 mg by mouth at bedtime.     Marland Kitchen levothyroxine (SYNTHROID, LEVOTHROID) 100 MCG tablet Take 100 mcg by mouth daily.    Marland Kitchen MAGNESIUM-OXIDE 400 (241.3 MG) MG tablet Take 400 mg by mouth 2 (two) times daily.  0  . metoprolol tartrate (LOPRESSOR) 25 MG tablet Take 1 tablet by mouth 2 (two) times daily.    . mycophenolate (MYFORTIC) 180 MG EC tablet Take 360 mg by mouth 2 (two) times daily.      Marland Kitchen omeprazole (PRILOSEC) 20 MG capsule Take 20 mg by mouth daily.     Marland Kitchen  polyethylene glycol (MIRALAX / GLYCOLAX) packet Take 17 g by mouth 2 (two) times daily as needed. For constipation    . polyvinyl alcohol-povidone (HYPOTEARS) 1.4-0.6 % ophthalmic solution Place 1-2 drops into both eyes 4 (four) times daily as needed (dry eyes).    . predniSONE (DELTASONE) 5 MG tablet Take 5 mg by mouth daily.     . tacrolimus (PROGRAF) 1 MG capsule Take 4 mg by mouth every evening.     . tacrolimus (PROGRAF) 5 MG capsule Take 5 mg by mouth every morning.    . warfarin (COUMADIN) 2.5 MG tablet Take 0.5 tablets (1.25 mg  total) by mouth one time only at 6 PM. 30 tablet 0   No current facility-administered medications for this visit.    Allergies  Amoxicillin; Ampicillin; Ciprofloxacin; Erythromycin; Penicillins; Sulfa antibiotics; Sulfamethoxazole-trimethoprim; Tetracycline; and Labetalol  Electrocardiogram:  NSR RBBB   09/04/13   NSR RBBB rate 77 no change from 2014  Assessment and Plan AR:  Mild no change in murmur consider echo in a year CRF:  F/U Dr Dederding  Baseline Cr mid 2's on prograf and deltasone Gout:  On allopurinol  Had pulse dose prednisone for Rx PAF: maint NSR  On low dose amiodarone will need TSH and LFTls next visit continue coumadin   Jenkins Rouge

## 2014-09-24 ENCOUNTER — Encounter: Payer: Self-pay | Admitting: Cardiovascular Disease

## 2014-09-24 ENCOUNTER — Ambulatory Visit (INDEPENDENT_AMBULATORY_CARE_PROVIDER_SITE_OTHER): Payer: Medicare Other | Admitting: Cardiovascular Disease

## 2014-09-24 VITALS — BP 138/68 | HR 72 | Ht 63.0 in | Wt 130.0 lb

## 2014-09-24 DIAGNOSIS — I480 Paroxysmal atrial fibrillation: Secondary | ICD-10-CM

## 2014-09-24 NOTE — Patient Instructions (Signed)
Medication Instructions:  NO CHANGES  Labwork: NONE  Testing/Procedures: NONE  Follow-Up: Your physician wants you to follow-up in: [place] will receive a reminder letter in the mail two months in advance. If you don't receive a letter, please call our office to schedule the follow-up appointment.   Any Other Special Instructions Will Be Listed Below (If Applicable).

## 2014-09-29 DIAGNOSIS — J154 Pneumonia due to other streptococci: Secondary | ICD-10-CM | POA: Diagnosis not present

## 2014-09-29 DIAGNOSIS — N184 Chronic kidney disease, stage 4 (severe): Secondary | ICD-10-CM | POA: Diagnosis not present

## 2014-09-29 DIAGNOSIS — I129 Hypertensive chronic kidney disease with stage 1 through stage 4 chronic kidney disease, or unspecified chronic kidney disease: Secondary | ICD-10-CM | POA: Diagnosis not present

## 2014-09-29 DIAGNOSIS — I480 Paroxysmal atrial fibrillation: Secondary | ICD-10-CM | POA: Diagnosis not present

## 2014-09-29 DIAGNOSIS — M329 Systemic lupus erythematosus, unspecified: Secondary | ICD-10-CM | POA: Diagnosis not present

## 2014-09-29 DIAGNOSIS — K5090 Crohn's disease, unspecified, without complications: Secondary | ICD-10-CM | POA: Diagnosis not present

## 2014-09-30 DIAGNOSIS — Z940 Kidney transplant status: Secondary | ICD-10-CM | POA: Diagnosis not present

## 2014-09-30 DIAGNOSIS — M109 Gout, unspecified: Secondary | ICD-10-CM | POA: Diagnosis not present

## 2014-10-09 ENCOUNTER — Encounter (HOSPITAL_COMMUNITY): Payer: BLUE CROSS/BLUE SHIELD

## 2014-10-16 ENCOUNTER — Encounter (HOSPITAL_COMMUNITY)
Admission: RE | Admit: 2014-10-16 | Discharge: 2014-10-16 | Disposition: A | Discharge status: Home / Self Care | Payer: Medicare Other | Source: Ambulatory Visit | Attending: Nephrology | Admitting: Nephrology

## 2014-10-16 DIAGNOSIS — N183 Chronic kidney disease, stage 3 (moderate): Secondary | ICD-10-CM | POA: Insufficient documentation

## 2014-10-16 DIAGNOSIS — D631 Anemia in chronic kidney disease: Secondary | ICD-10-CM | POA: Insufficient documentation

## 2014-10-16 LAB — RENAL FUNCTION PANEL
Albumin: 3.9 g/dL (ref 3.5–5.0)
Anion gap: 10 (ref 5–15)
BUN: 41 mg/dL — ABNORMAL HIGH (ref 6–20)
CO2: 22 mmol/L (ref 22–32)
Calcium: 9 mg/dL (ref 8.9–10.3)
Chloride: 104 mmol/L (ref 101–111)
Creatinine, Ser: 1.94 mg/dL — ABNORMAL HIGH (ref 0.44–1.00)
GFR calc Af Amer: 29 mL/min — ABNORMAL LOW (ref >60)
GFR calc non Af Amer: 25 mL/min — ABNORMAL LOW (ref >60)
Glucose, Bld: 105 mg/dL — ABNORMAL HIGH (ref 65–99)
POTASSIUM: 4.2 mmol/L (ref 3.5–5.1)
Phosphorus: 3.2 mg/dL (ref 2.5–4.6)
Sodium: 136 mmol/L (ref 135–145)

## 2014-10-16 LAB — PROTIME-INR
INR: 3.21 — ABNORMAL HIGH (ref 0.00–1.49)
Prothrombin Time: 32.2 seconds — ABNORMAL HIGH (ref 11.6–15.2)

## 2014-10-16 LAB — IRON AND TIBC
IRON: 57 ug/dL (ref 28–170)
Saturation Ratios: 24 % (ref 10.4–31.8)
TIBC: 238 ug/dL — ABNORMAL LOW (ref 250–450)
UIBC: 181 ug/dL

## 2014-10-16 LAB — FERRITIN: Ferritin: 626 ng/mL — ABNORMAL HIGH (ref 11–307)

## 2014-10-16 MED ORDER — EPOETIN ALFA 10000 UNIT/ML IJ SOLN: 40000.0000 [IU] | INTRAMUSCULAR | Status: DC

## 2014-10-16 MED ORDER — EPOETIN ALFA 40000 UNIT/ML IJ SOLN
INTRAMUSCULAR | Status: AC
  Administered 2014-10-16: 40000 [IU]
  Filled 2014-10-16: qty 1

## 2014-10-17 LAB — POCT HEMOGLOBIN-HEMACUE: HEMOGLOBIN: 10.2 g/dL — AB (ref 12.0–15.0)

## 2014-10-28 DIAGNOSIS — Z7901 Long term (current) use of anticoagulants: Secondary | ICD-10-CM | POA: Diagnosis not present

## 2014-11-04 ENCOUNTER — Other Ambulatory Visit (HOSPITAL_COMMUNITY): Payer: Self-pay | Admitting: *Deleted

## 2014-11-05 ENCOUNTER — Encounter (HOSPITAL_COMMUNITY)
Admission: RE | Admit: 2014-11-05 | Discharge: 2014-11-05 | Disposition: A | Discharge status: Home / Self Care | Payer: Medicare Other | Source: Ambulatory Visit | Attending: Nephrology | Admitting: Nephrology

## 2014-11-05 ENCOUNTER — Encounter (HOSPITAL_COMMUNITY): Payer: BLUE CROSS/BLUE SHIELD

## 2014-11-05 DIAGNOSIS — D509 Iron deficiency anemia, unspecified: Secondary | ICD-10-CM | POA: Insufficient documentation

## 2014-11-05 MED ORDER — SODIUM CHLORIDE 0.9 % IV SOLN
510.0000 mg | Freq: Once | INTRAVENOUS | Status: AC
  Administered 2014-11-05: 510 mg via INTRAVENOUS
  Filled 2014-11-05: qty 17

## 2014-11-05 MED ORDER — EPOETIN ALFA 10000 UNIT/ML IJ SOLN: 40000.0000 [IU] | INTRAMUSCULAR | Status: DC

## 2014-11-05 NOTE — Progress Notes (Signed)
Pt here for procrit, we were unable to obtain hemocue prior to iron infusion. Crystal at Dr Deterding office notified.  [place] for patient to  come back next week for injection

## 2014-11-06 LAB — POCT HEMOGLOBIN-HEMACUE: Hemoglobin: <4 g/dL — CL (ref 12.0–15.0)

## 2014-11-12 ENCOUNTER — Encounter (HOSPITAL_COMMUNITY): Payer: Self-pay | Admitting: Physical Medicine and Rehabilitation

## 2014-11-12 ENCOUNTER — Other Ambulatory Visit: Payer: Self-pay

## 2014-11-12 ENCOUNTER — Encounter (HOSPITAL_COMMUNITY)
Admission: RE | Admit: 2014-11-12 | Discharge: 2014-11-12 | Disposition: A | Discharge status: Home / Self Care | Payer: Medicare Other | Source: Ambulatory Visit | Attending: Nephrology | Admitting: Nephrology

## 2014-11-12 ENCOUNTER — Observation Stay (HOSPITAL_COMMUNITY)
Admission: EM | Admit: 2014-11-12 | Discharge: 2014-11-13 | Disposition: A | Discharge status: Home / Self Care | Payer: Medicare Other | Attending: Internal Medicine | Admitting: Internal Medicine

## 2014-11-12 ENCOUNTER — Emergency Department (HOSPITAL_COMMUNITY): Payer: Medicare Other

## 2014-11-12 DIAGNOSIS — Z86718 Personal history of other venous thrombosis and embolism: Secondary | ICD-10-CM | POA: Diagnosis not present

## 2014-11-12 DIAGNOSIS — I4891 Unspecified atrial fibrillation: Secondary | ICD-10-CM | POA: Diagnosis not present

## 2014-11-12 DIAGNOSIS — N184 Chronic kidney disease, stage 4 (severe): Secondary | ICD-10-CM

## 2014-11-12 DIAGNOSIS — K5090 Crohn's disease, unspecified, without complications: Secondary | ICD-10-CM | POA: Insufficient documentation

## 2014-11-12 DIAGNOSIS — R0789 Other chest pain: Secondary | ICD-10-CM | POA: Diagnosis not present

## 2014-11-12 DIAGNOSIS — C649 Malignant neoplasm of unspecified kidney, except renal pelvis: Secondary | ICD-10-CM | POA: Insufficient documentation

## 2014-11-12 DIAGNOSIS — D631 Anemia in chronic kidney disease: Secondary | ICD-10-CM | POA: Insufficient documentation

## 2014-11-12 DIAGNOSIS — D638 Anemia in other chronic diseases classified elsewhere: Secondary | ICD-10-CM | POA: Diagnosis not present

## 2014-11-12 DIAGNOSIS — Z7952 Long term (current) use of systemic steroids: Secondary | ICD-10-CM | POA: Insufficient documentation

## 2014-11-12 DIAGNOSIS — R0602 Shortness of breath: Secondary | ICD-10-CM | POA: Diagnosis not present

## 2014-11-12 DIAGNOSIS — K219 Gastro-esophageal reflux disease without esophagitis: Secondary | ICD-10-CM | POA: Diagnosis not present

## 2014-11-12 DIAGNOSIS — N183 Chronic kidney disease, stage 3 unspecified: Secondary | ICD-10-CM | POA: Diagnosis present

## 2014-11-12 DIAGNOSIS — Z905 Acquired absence of kidney: Secondary | ICD-10-CM | POA: Diagnosis not present

## 2014-11-12 DIAGNOSIS — J189 Pneumonia, unspecified organism: Principal | ICD-10-CM | POA: Insufficient documentation

## 2014-11-12 DIAGNOSIS — G4733 Obstructive sleep apnea (adult) (pediatric): Secondary | ICD-10-CM | POA: Diagnosis not present

## 2014-11-12 DIAGNOSIS — Z940 Kidney transplant status: Secondary | ICD-10-CM | POA: Diagnosis not present

## 2014-11-12 DIAGNOSIS — I120 Hypertensive chronic kidney disease with stage 5 chronic kidney disease or end stage renal disease: Secondary | ICD-10-CM | POA: Diagnosis not present

## 2014-11-12 DIAGNOSIS — Z7901 Long term (current) use of anticoagulants: Secondary | ICD-10-CM | POA: Diagnosis not present

## 2014-11-12 DIAGNOSIS — Z79899 Other long term (current) drug therapy: Secondary | ICD-10-CM | POA: Insufficient documentation

## 2014-11-12 DIAGNOSIS — Z9049 Acquired absence of other specified parts of digestive tract: Secondary | ICD-10-CM | POA: Diagnosis not present

## 2014-11-12 DIAGNOSIS — I482 Chronic atrial fibrillation: Secondary | ICD-10-CM

## 2014-11-12 DIAGNOSIS — E039 Hypothyroidism, unspecified: Secondary | ICD-10-CM | POA: Insufficient documentation

## 2014-11-12 DIAGNOSIS — M329 Systemic lupus erythematosus, unspecified: Secondary | ICD-10-CM | POA: Insufficient documentation

## 2014-11-12 DIAGNOSIS — Z992 Dependence on renal dialysis: Secondary | ICD-10-CM | POA: Insufficient documentation

## 2014-11-12 DIAGNOSIS — R05 Cough: Secondary | ICD-10-CM | POA: Diagnosis not present

## 2014-11-12 DIAGNOSIS — D649 Anemia, unspecified: Secondary | ICD-10-CM | POA: Diagnosis not present

## 2014-11-12 DIAGNOSIS — N186 End stage renal disease: Secondary | ICD-10-CM | POA: Insufficient documentation

## 2014-11-12 DIAGNOSIS — N289 Disorder of kidney and ureter, unspecified: Secondary | ICD-10-CM | POA: Diagnosis not present

## 2014-11-12 HISTORY — DX: Anxiety disorder, unspecified: F41.9

## 2014-11-12 LAB — COMPREHENSIVE METABOLIC PANEL
ALK PHOS: 154 U/L — AB (ref 38–126)
ALT: 12 U/L — ABNORMAL LOW (ref 14–54)
AST: 16 U/L (ref 15–41)
Albumin: 3.5 g/dL (ref 3.5–5.0)
Anion gap: 10 (ref 5–15)
BILIRUBIN TOTAL: 0.4 mg/dL (ref 0.3–1.2)
BUN: 46 mg/dL — ABNORMAL HIGH (ref 6–20)
CHLORIDE: 106 mmol/L (ref 101–111)
CO2: 21 mmol/L — ABNORMAL LOW (ref 22–32)
Calcium: 9.2 mg/dL (ref 8.9–10.3)
Creatinine, Ser: 2.63 mg/dL — ABNORMAL HIGH (ref 0.44–1.00)
GFR, EST AFRICAN AMERICAN: 20 mL/min — AB (ref >60)
GFR, EST NON AFRICAN AMERICAN: 17 mL/min — AB (ref >60)
GLUCOSE: 136 mg/dL — AB (ref 65–99)
POTASSIUM: 3.9 mmol/L (ref 3.5–5.1)
SODIUM: 137 mmol/L (ref 135–145)
Total Protein: 6.8 g/dL (ref 6.5–8.1)

## 2014-11-12 LAB — CBC WITH DIFFERENTIAL/PLATELET
Basophils Absolute: 0 10*3/uL (ref 0.0–0.1)
Basophils Relative: 0 % (ref 0–1)
Eosinophils Absolute: 0.4 10*3/uL (ref 0.0–0.7)
Eosinophils Relative: 4 % (ref 0–5)
HCT: 34 % — ABNORMAL LOW (ref 36.0–46.0)
Hemoglobin: 10.5 g/dL — ABNORMAL LOW (ref 12.0–15.0)
LYMPHS ABS: 1.4 10*3/uL (ref 0.7–4.0)
LYMPHS PCT: 14 % (ref 12–46)
MCH: 28.7 pg (ref 26.0–34.0)
MCHC: 30.9 g/dL (ref 30.0–36.0)
MCV: 92.9 fL (ref 78.0–100.0)
MONOS PCT: 10 % (ref 3–12)
Monocytes Absolute: 1 10*3/uL (ref 0.1–1.0)
NEUTROS ABS: 7.2 10*3/uL (ref 1.7–7.7)
NEUTROS PCT: 72 % (ref 43–77)
Platelets: 153 10*3/uL (ref 150–400)
RBC: 3.66 MIL/uL — ABNORMAL LOW (ref 3.87–5.11)
RDW: 15 % (ref 11.5–15.5)
WBC: 10 10*3/uL (ref 4.0–10.5)

## 2014-11-12 LAB — TSH: TSH: 0.706 u[IU]/mL (ref 0.350–4.500)

## 2014-11-12 LAB — I-STAT TROPONIN, ED: Troponin i, poc: 0 ng/mL (ref 0.00–0.08)

## 2014-11-12 LAB — PROTIME-INR
INR: 1.65 — AB (ref 0.00–1.49)
PROTHROMBIN TIME: 19.5 s — AB (ref 11.6–15.2)

## 2014-11-12 MED ORDER — SODIUM CHLORIDE 0.9 % IV SOLN
INTRAVENOUS | Status: DC
  Administered 2014-11-12: 17:00:00 via INTRAVENOUS

## 2014-11-12 MED ORDER — ACETAMINOPHEN 325 MG PO TABS: 650.0000 mg | ORAL_TABLET | Freq: Four times a day (QID) | ORAL | Status: DC | PRN

## 2014-11-12 MED ORDER — HYDROCODONE-ACETAMINOPHEN 5-325 MG PO TABS: 1.0000 | ORAL_TABLET | ORAL | Status: DC | PRN

## 2014-11-12 MED ORDER — VANCOMYCIN HCL IN DEXTROSE 1-5 GM/200ML-% IV SOLN
1000.0000 mg | Freq: Once | INTRAVENOUS | Status: AC
  Administered 2014-11-12: 1000 mg via INTRAVENOUS
  Filled 2014-11-12: qty 200

## 2014-11-12 MED ORDER — METOPROLOL TARTRATE 25 MG PO TABS
25.0000 mg | ORAL_TABLET | Freq: Two times a day (BID) | ORAL | Status: DC
  Administered 2014-11-12: 25 mg via ORAL
  Filled 2014-11-12 (×4): qty 1

## 2014-11-12 MED ORDER — WARFARIN - PHARMACIST DOSING INPATIENT: Freq: Every day | Status: DC

## 2014-11-12 MED ORDER — EPOETIN ALFA 40000 UNIT/ML IJ SOLN: 40000.0000 [IU] | INTRAMUSCULAR | Status: DC

## 2014-11-12 MED ORDER — ENSURE ENLIVE PO LIQD
237.0000 mL | Freq: Two times a day (BID) | ORAL | Status: DC
Start: 2014-11-13 — End: 2014-11-13
  Administered 2014-11-13: 237 mL via ORAL

## 2014-11-12 MED ORDER — HEPARIN SODIUM (PORCINE) 5000 UNIT/ML IJ SOLN
5000.0000 [IU] | Freq: Three times a day (TID) | INTRAMUSCULAR | Status: DC
  Filled 2014-11-12 (×4): qty 1

## 2014-11-12 MED ORDER — PREDNISONE 5 MG PO TABS
5.0000 mg | ORAL_TABLET | Freq: Every day | ORAL | Status: DC
  Administered 2014-11-12 – 2014-11-13 (×2): 5 mg via ORAL
  Filled 2014-11-12 (×3): qty 1

## 2014-11-12 MED ORDER — IPRATROPIUM-ALBUTEROL 0.5-2.5 (3) MG/3ML IN SOLN
3.0000 mL | Freq: Two times a day (BID) | RESPIRATORY_TRACT | Status: DC
  Administered 2014-11-13: 3 mL via RESPIRATORY_TRACT
  Filled 2014-11-12: qty 3

## 2014-11-12 MED ORDER — GUAIFENESIN ER 600 MG PO TB12: 1200.0000 mg | ORAL_TABLET | Freq: Two times a day (BID) | ORAL | Status: DC

## 2014-11-12 MED ORDER — SODIUM CHLORIDE 0.9 % IJ SOLN: 3.0000 mL | Freq: Two times a day (BID) | INTRAMUSCULAR | Status: DC

## 2014-11-12 MED ORDER — GABAPENTIN 100 MG PO CAPS
200.0000 mg | ORAL_CAPSULE | Freq: Every day | ORAL | Status: DC
  Administered 2014-11-12: 200 mg via ORAL
  Filled 2014-11-12 (×2): qty 2

## 2014-11-12 MED ORDER — ONDANSETRON HCL 4 MG/2ML IJ SOLN: 4.0000 mg | Freq: Four times a day (QID) | INTRAMUSCULAR | Status: DC | PRN

## 2014-11-12 MED ORDER — DEXTROSE 5 % IV SOLN
1.0000 g | Freq: Once | INTRAVENOUS | Status: AC
  Administered 2014-11-12: 1 g via INTRAVENOUS
  Filled 2014-11-12: qty 10

## 2014-11-12 MED ORDER — PANTOPRAZOLE SODIUM 40 MG PO TBEC
40.0000 mg | DELAYED_RELEASE_TABLET | Freq: Every day | ORAL | Status: DC
  Administered 2014-11-12 – 2014-11-13 (×2): 40 mg via ORAL
  Filled 2014-11-12 (×2): qty 1

## 2014-11-12 MED ORDER — GUAIFENESIN-DM 100-10 MG/5ML PO SYRP: 5.0000 mL | ORAL_SOLUTION | ORAL | Status: DC | PRN

## 2014-11-12 MED ORDER — MORPHINE SULFATE 2 MG/ML IJ SOLN
1.0000 mg | INTRAMUSCULAR | Status: DC | PRN
  Administered 2014-11-13: 1 mg via INTRAVENOUS
  Filled 2014-11-12: qty 1

## 2014-11-12 MED ORDER — DM-GUAIFENESIN ER 30-600 MG PO TB12
1.0000 | ORAL_TABLET | Freq: Two times a day (BID) | ORAL | Status: DC
  Administered 2014-11-12 – 2014-11-13 (×2): 1 via ORAL
  Filled 2014-11-12 (×4): qty 1

## 2014-11-12 MED ORDER — FUROSEMIDE 40 MG PO TABS
40.0000 mg | ORAL_TABLET | Freq: Every day | ORAL | Status: DC
  Filled 2014-11-12 (×3): qty 1

## 2014-11-12 MED ORDER — ACETAMINOPHEN 650 MG RE SUPP: 650.0000 mg | Freq: Four times a day (QID) | RECTAL | Status: DC | PRN

## 2014-11-12 MED ORDER — LEVOTHYROXINE SODIUM 100 MCG PO TABS
100.0000 ug | ORAL_TABLET | Freq: Every day | ORAL | Status: DC
  Administered 2014-11-12 – 2014-11-13 (×2): 100 ug via ORAL
  Filled 2014-11-12 (×2): qty 1

## 2014-11-12 MED ORDER — WARFARIN 1.25 MG HALF TABLET
1.2500 mg | ORAL_TABLET | Freq: Once | ORAL | Status: DC
  Administered 2014-11-12: 1.25 mg via ORAL
  Filled 2014-11-12: qty 1

## 2014-11-12 MED ORDER — TACROLIMUS 1 MG PO CAPS
5.0000 mg | ORAL_CAPSULE | Freq: Every morning | ORAL | Status: DC
Start: 2014-11-13 — End: 2014-11-13
  Administered 2014-11-13: 5 mg via ORAL
  Filled 2014-11-12: qty 5

## 2014-11-12 MED ORDER — MYCOPHENOLATE SODIUM 180 MG PO TBEC
360.0000 mg | DELAYED_RELEASE_TABLET | Freq: Two times a day (BID) | ORAL | Status: DC
  Administered 2014-11-12 – 2014-11-13 (×2): 360 mg via ORAL
  Filled 2014-11-12 (×3): qty 2

## 2014-11-12 MED ORDER — WARFARIN - PHYSICIAN DOSING INPATIENT: Freq: Every day | Status: DC

## 2014-11-12 MED ORDER — FLUTICASONE PROPIONATE 50 MCG/ACT NA SUSP: 2.0000 | Freq: Every day | NASAL | Status: DC

## 2014-11-12 MED ORDER — MAGNESIUM OXIDE 400 (241.3 MG) MG PO TABS
400.0000 mg | ORAL_TABLET | Freq: Two times a day (BID) | ORAL | Status: DC
  Administered 2014-11-12 – 2014-11-13 (×2): 400 mg via ORAL
  Filled 2014-11-12 (×4): qty 1

## 2014-11-12 MED ORDER — TACROLIMUS 1 MG PO CAPS
4.0000 mg | ORAL_CAPSULE | Freq: Every evening | ORAL | Status: DC
  Administered 2014-11-12: 4 mg via ORAL
  Filled 2014-11-12 (×2): qty 4

## 2014-11-12 MED ORDER — ONDANSETRON HCL 4 MG PO TABS: 4.0000 mg | ORAL_TABLET | Freq: Four times a day (QID) | ORAL | Status: DC | PRN

## 2014-11-12 MED ORDER — IPRATROPIUM-ALBUTEROL 0.5-2.5 (3) MG/3ML IN SOLN
3.0000 mL | RESPIRATORY_TRACT | Status: DC
  Administered 2014-11-12: 3 mL via RESPIRATORY_TRACT
  Filled 2014-11-12: qty 3

## 2014-11-12 MED ORDER — LEVOFLOXACIN 750 MG PO TABS
750.0000 mg | ORAL_TABLET | ORAL | Status: DC
  Administered 2014-11-13: 750 mg via ORAL
  Filled 2014-11-12 (×2): qty 1

## 2014-11-12 MED ORDER — WARFARIN 1.25 MG HALF TABLET
1.2500 mg | ORAL_TABLET | ORAL | Status: AC
  Administered 2014-11-12: 1.25 mg via ORAL
  Filled 2014-11-12: qty 1

## 2014-11-12 MED ORDER — AMIODARONE HCL 100 MG PO TABS
50.0000 mg | ORAL_TABLET | Freq: Two times a day (BID) | ORAL | Status: DC
  Administered 2014-11-12 – 2014-11-13 (×2): 50 mg via ORAL
  Filled 2014-11-12 (×3): qty 1

## 2014-11-12 MED ORDER — CALCITRIOL 0.25 MCG PO CAPS
0.2500 ug | ORAL_CAPSULE | ORAL | Status: DC
  Administered 2014-11-13: 0.25 ug via ORAL
  Filled 2014-11-12: qty 1

## 2014-11-12 NOTE — Progress Notes (Signed)
Pt placed on CPAP at 10 CMH20 via nasal mask per Pt home regimen.  Pt tolerating well at this time, RT to monitor and assess as needed.

## 2014-11-12 NOTE — Progress Notes (Signed)
[name] [id number] Admission Data: 11/12/2014 5:17 PM Attending Provider: Verlee Monte, MD  WUJ:[email or web address],NFAOZ L, MD Consults/ Treatment Team:    [name] is a 74 y.o. female patient admitted from ED awake, alert  & orientated  X 3,  Full Code, VSS - Blood pressure 155/66, pulse 68, temperature 97.5 F (36.4 C), temperature source Oral, resp. rate 18, height 5' 3"  (1.6 m), weight 60.737 kg (133 lb 14.4 oz), last menstrual period 03/05/2014, SpO2 99 %.,no c/o shortness of breath, no c/o chest pain, no distress noted. Tele # 4  Placed.  IV site WDL: Right upper shoulder, SL.  Allergies:   Allergies  Allergen Reactions  . Amoxicillin Anaphylaxis  . Ampicillin Anaphylaxis  . Ciprofloxacin Swelling    "of my throat"  . Erythromycin Anaphylaxis  . Penicillins Anaphylaxis  . Sulfa Antibiotics Itching  . Sulfamethoxazole-Trimethoprim Itching  . Tetracycline Anaphylaxis  . Labetalol Nausea And Vomiting     Past Medical History  Diagnosis Date  . Crohn's disease   . Hypertension   . Refusal of blood transfusion for reasons of conscience 12/20/2011    pt is Jehov[employer]'s Witness  . Atrial fibrillation   . DVT of leg (deep venous thrombosis) 2012-8./13/2013    "have had one in each leg now"  . Numbness and tingling of foot     bilaterally  . GERD (gastroesophageal reflux disease)   . Hypothyroidism   . Lower GI bleed 2012  . DVT of lower extremity, bilateral     "have had them in both legs; last one was on the RLE this year" (04/25/2012)  . OSA on CPAP   . Anemia     "when lupus flares up"  . SLE (systemic lupus erythematosus)   . Arthritis     "in my joints"  . Renal disorder     S/P nephrectomy; dialysis; "working fine now" (12/20/11)  . Renal cell carcinoma   . CAP (community acquired pneumonia) 08/20/2014     Pt orientation to unit, room and routine. Information packet given to patient/family and safety video watched.  Admission INP armband ID verified with  patient/family, and in place. SR up x 2, fall risk assessment complete with Patient and family verbalizing understanding of risks associated with falls. Pt verbalizes an understanding of how to use the call bell and to call for help before getting out of bed.  Skin, clean-dry- intact without evidence of bruising, or skin tears.   No evidence of skin break down noted on exam.    Will cont to monitor and assist as needed.  Dayle Points, RN 11/12/2014 5:17 PM

## 2014-11-12 NOTE — Consult Note (Addendum)
[place] for :  Coumadin  :  Vancomycin Indication:  Hx atrial fibrillation, DVT  :  CAP  Hospital Problems: Principal Problem:   Community acquired pneumonia Active Problems:   Atrial fibrillation   CKD (chronic kidney disease), stage IV   History of DVT (deep vein thrombosis)   Anemia of chronic disease  Allergies  Allergen Reactions  . Amoxicillin Anaphylaxis  . Ampicillin Anaphylaxis  . Ciprofloxacin Swelling    "of my throat"  . Erythromycin Anaphylaxis  . Penicillins Anaphylaxis  . Sulfa Antibiotics Itching  . Sulfamethoxazole-Trimethoprim Itching  . Tetracycline Anaphylaxis  . Labetalol Nausea And Vomiting   CURRENTLY: Vancomycin Dosing Weight: 61 kg 97.5 F (36.4 C) (Oral) Lab Results  Component Value Date   WBC 10.0 11/12/2014   Past Medical History  Diagnosis Date  . Crohn's disease   . Hypertension   . Refusal of blood transfusion for reasons of conscience 12/20/2011    pt is Jehovah's Witness  . Atrial fibrillation   . DVT of leg (deep venous thrombosis) 2012-8./13/2013    "have had one in each leg now"  . Numbness and tingling of foot     bilaterally  . GERD (gastroesophageal reflux disease)   . Hypothyroidism   . Lower GI bleed 2012  . DVT of lower extremity, bilateral     "have had them in both legs; last one was on the RLE this year" (04/25/2012)  . OSA on CPAP   . Anemia     "when lupus flares up"  . SLE (systemic lupus erythematosus)   . Arthritis     "in my joints"  . Renal disorder     S/P nephrectomy; dialysis; "working fine now" (12/20/11)  . Renal cell carcinoma   . CAP (community acquired pneumonia) 08/20/2014    Past Surgical History  Procedure Laterality Date  . Cesarean section  1984  . Nephrectomy transplanted organ  2006    bilaterally  . Colectomy  1998    "removal of abscess" (04/25/2012)  . Excisional hemorrhoidectomy    . Cataract extraction w/ intraocular lens  implant, bilateral   2005-2010  . Vena cava filter placement  2012  . Insertion of dialysis catheter  1988-2006    "peritoneal and hemodialysis; had multiple grafts and fistulas; last graft clotted off 2012"  . Cholecystectomy  1995  . Colostomy  1998  . Colostomy reversal  1999    "6 months after placed" (04/25/2012)  . Bone marrow biopsy  1993; 1995  . Abdominal exploration surgery  1995    "found sluggish bone marrow" (04/25/2012)  . Parathyroid implant removal Left     removed parathyroid from neck and implanted in arm  . Colonoscopy N/A 08/30/2013    Procedure: COLONOSCOPY;  Surgeon: Winfield Cunas., MD;  Location: [facility];  Service: Endoscopy;  Laterality: N/A;     LABS:  Recent Labs Lab 11/12/14 1413  NA 137  K 3.9  CL 106  CO2 21*  GLUCOSE 136*  BUN 46*  CREATININE 2.63*  CALCIUM 9.2  NEUTROABS 7.2  HGB 10.5*  HCT 34.0*  MCV 92.9  PLT 153    CrCl: Estimated Creatinine Clearance: 16.2 mL/min (by C-G formula based on Cr of 2.63).  MICROBIOLOGY: Lab Results  Component Value Date   CULT  08/21/2014    NORMAL OROPHARYNGEAL FLORA Performed at [facility]  08/20/2014    NO GROWTH 5 DAYS Performed at [facility]    CULT  08/20/2014    NO GROWTH 5 DAYS Performed at [facility]  01/23/2014    Multiple bacterial morphotypes present, none predominant. Suggest appropriate recollection if clinically indicated. Performed at [place] Performed at [facility] 06/13/2013   No results for input(s): CULT, SDES in the last 168 hours.  MEDICATIONS: Prior to Admission: Prescriptions prior to admission  Medication Sig Dispense Refill Last Dose  . acetaminophen (TYLENOL) 325 MG tablet Take 650 mg by mouth every 6 (six) hours as needed for mild pain.   11/11/2014 at Unknown time  . allopurinol (ZYLOPRIM) 100 MG tablet Take 100 mg by mouth 2 (two) times daily.    11/11/2014 at Unknown time  . amiodarone  (PACERONE) 100 MG tablet Take 0.5 tablets by mouth 2 (two) times daily.  0 11/11/2014 at Unknown time  . calcitRIOL (ROCALTROL) 0.25 MCG capsule Take 0.25 mcg by mouth every other day.    11/11/2014 at Unknown time  . dextromethorphan-guaiFENesin (MUCINEX DM) 30-600 MG per 12 hr tablet Take 1 tablet by mouth 2 (two) times daily.   Past Week at Unknown time  . epoetin alfa (EPOGEN,PROCRIT) 58527 UNIT/ML injection Inject 40,000 Units into the skin every 21 ( twenty-one) days.    Past Month at Unknown time  . feeding supplement, ENSURE ENLIVE, (ENSURE ENLIVE) LIQD Take 237 mLs by mouth 2 (two) times daily between meals. 60 Bottle 12 Past Week at Unknown time  . furosemide (LASIX) 40 MG tablet Take 40 mg by mouth daily.  0 11/11/2014 at Unknown time  . gabapentin (NEURONTIN) 100 MG capsule Take 200 mg by mouth at bedtime.    11/11/2014 at Unknown time  . levofloxacin (LEVAQUIN) 750 MG tablet Take 750 mg by mouth every other day. Started medication on 11-08-14  0 11/11/2014 at Unknown time  . levothyroxine (SYNTHROID, LEVOTHROID) 100 MCG tablet Take 100 mcg by mouth daily.   11/11/2014 at Unknown time  . MAGNESIUM-OXIDE 400 (241.3 MG) MG tablet Take 400 mg by mouth 2 (two) times daily.  0 11/11/2014 at Unknown time  . metoprolol tartrate (LOPRESSOR) 25 MG tablet Take 1 tablet by mouth 2 (two) times daily.   11/11/2014 at 0800  . mycophenolate (MYFORTIC) 180 MG EC tablet Take 360 mg by mouth 2 (two) times daily.     11/11/2014 at Unknown time  . omeprazole (PRILOSEC) 20 MG capsule Take 20 mg by mouth daily.    11/11/2014 at Unknown time  . polyethylene glycol (MIRALAX / GLYCOLAX) packet Take 17 g by mouth 2 (two) times daily as needed. For constipation   Past Week at Unknown time  . polyvinyl alcohol-povidone (HYPOTEARS) 1.4-0.6 % ophthalmic solution Place 1-2 drops into both eyes 4 (four) times daily as needed (dry eyes).   Past Week at Unknown time  . predniSONE (DELTASONE) 5 MG tablet Take 5 mg by mouth daily.    11/11/2014 at  Unknown time  . tacrolimus (PROGRAF) 1 MG capsule Take 4 mg by mouth every evening.    11/11/2014 at Unknown time  . tacrolimus (PROGRAF) 5 MG capsule Take 5 mg by mouth every morning.   11/11/2014 at Unknown time  . warfarin (COUMADIN) 2.5 MG tablet Take 1.25-2.5 mg by mouth See admin instructions. Take 2.78m tablet every day except on Thursdays take 1/2 of the 2.557m  Past Week at Unknown time   Scheduled:  Scheduled:  .  amiodarone  50 mg Oral BID  . [START ON 11/13/2014] calcitRIOL  0.25 mcg Oral QODAY  . dextromethorphan-guaiFENesin  1 tablet Oral BID  . epoetin alfa  40,000 Units Subcutaneous Q21 days  . [START ON 11/13/2014] feeding supplement (ENSURE ENLIVE)  237 mL Oral BID BM  . furosemide  40 mg Oral Daily  . gabapentin  200 mg Oral QHS  . heparin  5,000 Units Subcutaneous 3 times per day  . ipratropium-albuterol  3 mL Nebulization Q4H  . levofloxacin  750 mg Oral Q48H  . levothyroxine  100 mcg Oral QAC breakfast  . magnesium oxide  400 mg Oral BID  . metoprolol tartrate  25 mg Oral BID  . mycophenolate  360 mg Oral BID  . pantoprazole  40 mg Oral Daily  . predniSONE  5 mg Oral Daily  . sodium chloride  3 mL Intravenous Q12H  . tacrolimus  4 mg Oral QPM  . [START ON 11/13/2014] tacrolimus  5 mg Oral q morning - 10a  . vancomycin  1,000 mg Intravenous Once  . warfarin  1.25 mg Oral ONCE-1800   Infusion[s]: Infusions:  . sodium chloride     Antibiotic[s]: Anti-infectives    Start     Dose/Rate Route Frequency Ordered Stop   11/12/14 1715  levofloxacin (LEVAQUIN) tablet 750 mg     750 mg Oral Every 48 hours 11/12/14 1709     11/12/14 1700  vancomycin (VANCOCIN) IVPB 1000 mg/200 mL premix     1,000 mg 200 mL/hr over 60 Minutes Intravenous  Once 11/12/14 1648     11/12/14 1530  cefTRIAXone (ROCEPHIN) 1 g in dextrose 5 % 50 mL IVPB     1 g 100 mL/hr over 30 Minutes Intravenous  Once 11/12/14 1534 11/12/14 1643      ASSESSMENT:  74 y.o. female with an extensive and  complicated medical and surgical history is admitted with CAP.  She has been taking Levaquin Prior to Admission.  Vancomycin is to be added per Pharmacy Consult.  She has Vancomycin 1 gm IV x 1 ordered.  She has ESRD following a renal transplant, history of renal cell carcinoma.  Current estimated CrCl ~ 16 ml/min.  Pharmacy is to manage Coumadin while patient is admitted.  Last dose of Coumadin sometime last week.  Baseline INR pending.  GOAL:    TARGET INR 2-3   Vancomycin trough level 15-20 mcg/ml Levaquin dosed for clinical indication and adjusted for renal function as required.  PLAN: 1. Continue Levaquin as ordered 2. Continue Vancomycin 1 gm IV q 48 hours.  Next dose 11/14/14. 3. Will dose Coumadin pending results of baseline INR. 4. Daily INR's, CBC. Monitor for bleeding complications.   Follow Platelet counts 5. Monitor renal function, WBC, fever curve, any cultures/sensitivities, Vancomycin levels as clinically indicated, length of therapy, and follow clinical progression.   Marthenia Rolling,  Pharm.D   11/12/2014,  5:34 PM   ADDN: Baseline INR is subtherapeutic at 1.65. Pt's home regimen is 1.66m on Thursday and 2.53mAODs.  Plan: Warfarin 2.85m24monight x1 Daily INR Monitor s/sx of bleeding  CorAndrey CotaalDiona FoleyharmD Clinical Pharmacist Pager [phone]

## 2014-11-12 NOTE — ED Notes (Addendum)
Pt returned from xray to the room. Dr. Milagros Evener at the bedside.

## 2014-11-12 NOTE — H&P (Signed)
[facility] History and Physical  [name] XLK:[id number] DOB: 1941/03/04 DOA: 11/12/2014  Referring physician: EDP PCP: Placido Sou, MD   Chief Complaint: Cough  HPI: [name] is a 74 y.o. female past medical history of renal transplant, patient is on Myfortic, Prograf and prednisone, she also has Crohn's disease, history of atrial fibrillation and DVT she is on anticoagulation, she is a Restaurant manager, fast food. Patient came into the hospital for Procrit injection and directed by the short stay nursing staff to the ED to get admitted. Patient reported that she had pneumonia back in April treated for that with antibiotics, patient about 2 weeks ago developed same cough with some sputum production and shortness of breath. Levofloxacin was prescribed, she started taking that on Friday, she thought she is doing better but she came in earlier today to the short stay to get Procrit injection and the nursing staff directed her to the ED for further evaluation. In the ED chest x-ray showed left-sided recurrent pneumonia, patient denies fever but she still has cough, sputum production and SOB.    Review of Systems:  Constitutional: negative for anorexia, fevers and sweats Eyes: negative for irritation, redness and visual disturbance Ears, nose, mouth, throat, and face: negative for earaches, epistaxis, nasal congestion and sore throat Resp: Per history of present illness diovascular: negative for chest pain, dyspnea, lower extremity edema, orthopnea, palpitations and syncope Gastrointestinal: negative for abdominal pain, constipation, diarrhea, melena, nausea and vomiting Genitourinary:negative for dysuria, frequency and hematuria Hematologic/lymphatic: negative for bleeding, easy bruising and lymphadenopathy Musculoskeletal:negative for arthralgias, muscle weakness and stiff joints Neurological: negative for coordination problems, gait problems, headaches and weakness Endocrine:  negative for diabetic symptoms including polydipsia, polyuria and weight loss Allergic/Immunologic: negative for anaphylaxis, hay fever and urticaria  Past Medical History  Diagnosis Date  . Crohn's disease   . Hypertension   . Refusal of blood transfusion for reasons of conscience 12/20/2011    pt is Jehovah's Witness  . Atrial fibrillation   . DVT of leg (deep venous thrombosis) 2012-8./13/2013    "have had one in each leg now"  . Numbness and tingling of foot     bilaterally  . GERD (gastroesophageal reflux disease)   . Hypothyroidism   . Lower GI bleed 2012  . DVT of lower extremity, bilateral     "have had them in both legs; last one was on the RLE this year" (04/25/2012)  . OSA on CPAP   . Anemia     "when lupus flares up"  . SLE (systemic lupus erythematosus)   . Arthritis     "in my joints"  . Renal disorder     S/P nephrectomy; dialysis; "working fine now" (12/20/11)  . Renal cell carcinoma   . CAP (community acquired pneumonia) 08/20/2014   Past Surgical History  Procedure Laterality Date  . Cesarean section  1984  . Nephrectomy transplanted organ  2006    bilaterally  . Colectomy  1998    "removal of abscess" (04/25/2012)  . Excisional hemorrhoidectomy    . Cataract extraction w/ intraocular lens  implant, bilateral  2005-2010  . Vena cava filter placement  2012  . Insertion of dialysis catheter  1988-2006    "peritoneal and hemodialysis; had multiple grafts and fistulas; last graft clotted off 2012"  . Cholecystectomy  1995  . Colostomy  1998  . Colostomy reversal  1999    "6 months after placed" (04/25/2012)  . Bone marrow biopsy  1993; 1995  .  Abdominal exploration surgery  1995    "found sluggish bone marrow" (04/25/2012)  . Parathyroid implant removal Left     removed parathyroid from neck and implanted in arm  . Colonoscopy N/A 08/30/2013    Procedure: COLONOSCOPY;  Surgeon: Winfield Cunas., MD;  Location: [facility];  Service: Endoscopy;   Laterality: N/A;   Social History:   reports that she has never smoked. She has never used smokeless tobacco. She reports that she does not drink alcohol or use illicit drugs.  Allergies  Allergen Reactions  . Amoxicillin Anaphylaxis  . Ampicillin Anaphylaxis  . Ciprofloxacin Swelling    "of my throat"  . Erythromycin Anaphylaxis  . Penicillins Anaphylaxis  . Sulfa Antibiotics Itching  . Sulfamethoxazole-Trimethoprim Itching  . Tetracycline Anaphylaxis  . Labetalol Nausea And Vomiting    Family History  Problem Relation Age of Onset  . Heart disease Mother   . Hypertension      runs in family  . Sleep apnea Sister     Prior to Admission medications   Medication Sig Start Date End Date Taking? Authorizing Provider  acetaminophen (TYLENOL) 325 MG tablet Take 650 mg by mouth every 6 (six) hours as needed for mild pain.    Historical Provider, MD  allopurinol (ZYLOPRIM) 100 MG tablet Take 1 tablet by mouth for 1 week. There after take 2 tablets daily 09/22/14   Historical Provider, MD  amiodarone (PACERONE) 100 MG tablet Take 0.5 tablets by mouth 2 (two) times daily. 07/22/14   Historical Provider, MD  calcitRIOL (ROCALTROL) 0.25 MCG capsule Take 0.25 mcg by mouth every other day.     Historical Provider, MD  dextromethorphan-guaiFENesin (MUCINEX DM) 30-600 MG per 12 hr tablet Take 1 tablet by mouth 2 (two) times daily.    Historical Provider, MD  epoetin alfa (EPOGEN,PROCRIT) 41287 UNIT/ML injection Inject 40,000 Units into the skin every 21 ( twenty-one) days. Due Thursday, 08/21/14    Historical Provider, MD  feeding supplement, ENSURE ENLIVE, (ENSURE ENLIVE) LIQD Take 237 mLs by mouth 2 (two) times daily between meals. 08/26/14   Tasrif Ahmed, MD  fluticasone (FLONASE) 50 MCG/ACT nasal spray Place 2 sprays into both nostrils daily. 08/26/14   Tasrif Ahmed, MD  furosemide (LASIX) 40 MG tablet Take 40 mg by mouth daily. 08/26/14   Historical Provider, MD  gabapentin (NEURONTIN) 100 MG  capsule Take 200 mg by mouth at bedtime.     Historical Provider, MD  levofloxacin (LEVAQUIN) 750 MG tablet Take 750 mg by mouth every other day. 11/07/14   Historical Provider, MD  levothyroxine (SYNTHROID, LEVOTHROID) 100 MCG tablet Take 100 mcg by mouth daily.    Historical Provider, MD  MAGNESIUM-OXIDE 400 (241.3 MG) MG tablet Take 400 mg by mouth 2 (two) times daily. 05/19/14   Historical Provider, MD  metoprolol tartrate (LOPRESSOR) 25 MG tablet Take 1 tablet by mouth 2 (two) times daily. 09/18/14   Historical Provider, MD  mycophenolate (MYFORTIC) 180 MG EC tablet Take 360 mg by mouth 2 (two) times daily.      Historical Provider, MD  omeprazole (PRILOSEC) 20 MG capsule Take 20 mg by mouth daily.     Historical Provider, MD  polyethylene glycol (MIRALAX / GLYCOLAX) packet Take 17 g by mouth 2 (two) times daily as needed. For constipation    Historical Provider, MD  polyvinyl alcohol-povidone (HYPOTEARS) 1.4-0.6 % ophthalmic solution Place 1-2 drops into both eyes 4 (four) times daily as needed (dry eyes).    Historical Provider,  MD  predniSONE (DELTASONE) 5 MG tablet Take 5 mg by mouth daily.     Historical Provider, MD  tacrolimus (PROGRAF) 1 MG capsule Take 4 mg by mouth every evening.     Historical Provider, MD  tacrolimus (PROGRAF) 5 MG capsule Take 5 mg by mouth every morning.    Historical Provider, MD  warfarin (COUMADIN) 2.5 MG tablet Take 0.5 tablets (1.25 mg total) by mouth one time only at 6 PM. 08/26/14   Dellia Nims, MD   Physical Exam: Filed Vitals:   11/12/14 1600  BP: 154/78  Pulse: 71  Temp:   Resp: 21   Constitutional: Oriented to person, place, and time. Well-developed and well-nourished. Cooperative.  Head: Normocephalic and atraumatic.  Nose: Nose normal.  Mouth/Throat: Uvula is midline, oropharynx is clear and moist and mucous membranes are normal.  Eyes: Conjunctivae and EOM are normal. Pupils are equal, round, and reactive to light.  Neck: Trachea normal and  normal range of motion. Neck supple.  Cardiovascular: Normal rate, regular rhythm, S1 normal, S2 normal, normal heart sounds and intact distal pulses.   Pulmonary/Chest: Effort normal and breath sounds normal.  Abdominal: Soft. Bowel sounds are normal. There is no hepatosplenomegaly. There is no tenderness.  Musculoskeletal: Normal range of motion.  Neurological: Alert and oriented to person, place, and time. Has normal strength. No cranial nerve deficit or sensory deficit.  Skin: Skin is warm, dry and intact.  Psychiatric: Has a normal mood and affect. Speech is normal and behavior is normal.   Labs on Admission:  Basic Metabolic Panel:  Recent Labs Lab 11/12/14 1413  NA 137  K 3.9  CL 106  CO2 21*  GLUCOSE 136*  BUN 46*  CREATININE 2.63*  CALCIUM 9.2   Liver Function Tests:  Recent Labs Lab 11/12/14 1413  AST 16  ALT 12*  ALKPHOS 154*  BILITOT 0.4  PROT 6.8  ALBUMIN 3.5   No results for input(s): LIPASE, AMYLASE in the last 168 hours. No results for input(s): AMMONIA in the last 168 hours. CBC:  Recent Labs Lab 11/12/14 1413  WBC 10.0  NEUTROABS 7.2  HGB 10.5*  HCT 34.0*  MCV 92.9  PLT 153   Cardiac Enzymes: No results for input(s): CKTOTAL, CKMB, CKMBINDEX, TROPONINI in the last 168 hours.  BNP (last 3 results) No results for input(s): BNP in the last 8760 hours.  ProBNP (last 3 results) No results for input(s): PROBNP in the last 8760 hours.  CBG: No results for input(s): GLUCAP in the last 168 hours.  Radiological Exams on Admission: Dg Chest 2 View  11/12/2014   CLINICAL DATA:  Productive cough, diffuse chest pressure, and shortness of breath since 10/27/2014. History of recent pneumonia.  EXAM: CHEST  2 VIEW  COMPARISON:  08/26/2014  FINDINGS: The cardiomediastinal silhouette is unchanged and within normal limits. Thoracic aortic calcification is noted. There is improved aeration of the left lower lobe, however mild basilar retrocardiac opacity  persists. There is no evidence of new airspace opacity elsewhere. No pulmonary edema, pleural effusion, or pneumothorax is identified. Scattered areas of scarring and fissural thickening are again noted. Calcified pleural plaque is noted along the right hemidiaphragm. Numerous surgical clips and an IVC filter are partially visualized in the upper abdomen. Surgical clips are also noted in the lower neck.  IMPRESSION: Improved but not resolved left lower lobe airspace opacity. This may reflect residual/ recurrent pneumonia in the appropriate clinical setting, in which case continued radiographic follow-up after additional treatment  would be recommended to ensure complete resolution. If this does not fully resolve, chest CT would be suggested.   Electronically Signed   By: Logan Bores   On: 11/12/2014 14:54    EKG: Independently reviewed.   Assessment/Plan Principal Problem:   Community acquired pneumonia Active Problems:   Atrial fibrillation   CKD (chronic kidney disease), stage IV   History of DVT (deep vein thrombosis)   Anemia of chronic disease   Pneumonia Left lower lobe community-acquired pneumonia, likely recurrent. Patient had 2 or 3 doses of her levofloxacin, continue same dose of 750 every other day. Add vancomycin per pharmacy. Continue supportive management with broncho-dilators, mucolytics and antitussives as well as oxygen. I think patient can probably go home in the morning if she is doing okay.  ESRD status post renal transplant Currently on Myfortic, Prograf and prednisone, doing okay. Creatinine at baseline, check BMP p.m. a.m.  Atrial fibrillation Rate is controlled with beta blockers, she is on Coumadin. Pharmacy for dosage, INR not checked yet. This patients CHA2DS2-VASc Score and unadjusted Ischemic Stroke Rate (% per year) is equal to 3.2 % stroke rate/year from a score of 3 for HTN, female gender and age of 60. Above score calculated as 1 point each if present  [CHF, HTN, DM, Vascular=MI/PAD/Aortic Plaque, Age if 65-74, or Female] Above score calculated as 2 points each if present [Age > 75, or Stroke/TIA/TE]  History of DVT Patient is on Coumadin anyway for her atrial fibrillation.  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 Supposed to get Procrit shot today, ordered.    Code Status: Full code Family Communication:  plan discussed with the patient  Disposition Plan: observation, MedSurg, can probably be discharged in a.m. if doing okay.   Time spent: 70 minutes  Shanyah Gattuso A, MD [facility] Pager [phone]

## 2014-11-12 NOTE — Progress Notes (Signed)
B/P 168/70 pulse 80 sat 100 and c/o shortness of breath and chest tightness and Dr Deterding's office called and per office client sent to ER

## 2014-11-12 NOTE — ED Provider Notes (Addendum)
CSN: [id number]     Arrival date & time 11/12/14  1401 History   First MD Initiated Contact with Patient 11/12/14 1415     Chief Complaint  Patient presents with  . Cough  . Chest Pain  . Shortness of Breath     (Consider location/radiation/quality/duration/timing/severity/associated sxs/prior Treatment) HPI Complains of cough productive of yellow sputum onset 10/27/2014. So stated symptoms include mild shortness of breath and "chest tightness" she's been treated with over-the-counter cough medicines and started on Levaquin every other day last week of which she's had 3 doses. She presents as her sputum has become thicker over the past 3 days. And more short of breath. No vomiting no other associated symptoms No vomiting no fever no other associated symptoms productive Past Medical History  Diagnosis Date  . Crohn's disease   . Hypertension   . Refusal of blood transfusion for reasons of conscience 12/20/2011    pt is Jehovah's Witness  . Atrial fibrillation   . DVT of leg (deep venous thrombosis) 2012-8./13/2013    "have had one in each leg now"  . Numbness and tingling of foot     bilaterally  . GERD (gastroesophageal reflux disease)   . Hypothyroidism   . Lower GI bleed 2012  . DVT of lower extremity, bilateral     "have had them in both legs; last one was on the RLE this year" (04/25/2012)  . OSA on CPAP   . Anemia     "when lupus flares up"  . SLE (systemic lupus erythematosus)   . Arthritis     "in my joints"  . Renal disorder     S/P nephrectomy; dialysis; "working fine now" (12/20/11)  . Renal cell carcinoma   . CAP (community acquired pneumonia) 08/20/2014   Past Surgical History  Procedure Laterality Date  . Cesarean section  1984  . Nephrectomy transplanted organ  2006    bilaterally  . Colectomy  1998    "removal of abscess" (04/25/2012)  . Excisional hemorrhoidectomy    . Cataract extraction w/ intraocular lens  implant, bilateral  2005-2010  . Vena cava  filter placement  2012  . Insertion of dialysis catheter  1988-2006    "peritoneal and hemodialysis; had multiple grafts and fistulas; last graft clotted off 2012"  . Cholecystectomy  1995  . Colostomy  1998  . Colostomy reversal  1999    "6 months after placed" (04/25/2012)  . Bone marrow biopsy  1993; 1995  . Abdominal exploration surgery  1995    "found sluggish bone marrow" (04/25/2012)  . Parathyroid implant removal Left     removed parathyroid from neck and implanted in arm  . Colonoscopy N/A 08/30/2013    Procedure: COLONOSCOPY;  Surgeon: Winfield Cunas., MD;  Location: [facility];  Service: Endoscopy;  Laterality: N/A;   Family History  Problem Relation Age of Onset  . Heart disease Mother   . Hypertension      runs in family  . Sleep apnea Sister    History  Substance Use Topics  . Smoking status: Never Smoker   . Smokeless tobacco: Never Used  . Alcohol Use: No   OB History    No data available     Review of Systems  Constitutional: Negative.   HENT: Negative.   Respiratory: Positive for cough, chest tightness and shortness of breath.   Cardiovascular: Negative.   Gastrointestinal: Negative.   Musculoskeletal: Negative.   Skin: Negative.   Neurological: Negative.  Psychiatric/Behavioral: Negative.   All other systems reviewed and are negative.     Allergies  Amoxicillin; Ampicillin; Ciprofloxacin; Erythromycin; Penicillins; Sulfa antibiotics; Sulfamethoxazole-trimethoprim; Tetracycline; and Labetalol  Home Medications   Prior to Admission medications   Medication Sig Start Date End Date Taking? Authorizing Provider  acetaminophen (TYLENOL) 325 MG tablet Take 650 mg by mouth every 6 (six) hours as needed for mild pain.    Historical Provider, MD  allopurinol (ZYLOPRIM) 100 MG tablet Take 1 tablet by mouth for 1 week. There after take 2 tablets daily 09/22/14   Historical Provider, MD  amiodarone (PACERONE) 100 MG tablet Take 0.5 tablets by mouth 2  (two) times daily. 07/22/14   Historical Provider, MD  calcitRIOL (ROCALTROL) 0.25 MCG capsule Take 0.25 mcg by mouth every other day.     Historical Provider, MD  dextromethorphan-guaiFENesin (MUCINEX DM) 30-600 MG per 12 hr tablet Take 1 tablet by mouth 2 (two) times daily.    Historical Provider, MD  epoetin alfa (EPOGEN,PROCRIT) 55374 UNIT/ML injection Inject 40,000 Units into the skin every 21 ( twenty-one) days. Due Thursday, 08/21/14    Historical Provider, MD  feeding supplement, ENSURE ENLIVE, (ENSURE ENLIVE) LIQD Take 237 mLs by mouth 2 (two) times daily between meals. 08/26/14   Tasrif Ahmed, MD  fluticasone (FLONASE) 50 MCG/ACT nasal spray Place 2 sprays into both nostrils daily. 08/26/14   Tasrif Ahmed, MD  gabapentin (NEURONTIN) 100 MG capsule Take 200 mg by mouth at bedtime.     Historical Provider, MD  levothyroxine (SYNTHROID, LEVOTHROID) 100 MCG tablet Take 100 mcg by mouth daily.    Historical Provider, MD  MAGNESIUM-OXIDE 400 (241.3 MG) MG tablet Take 400 mg by mouth 2 (two) times daily. 05/19/14   Historical Provider, MD  metoprolol tartrate (LOPRESSOR) 25 MG tablet Take 1 tablet by mouth 2 (two) times daily. 09/18/14   Historical Provider, MD  mycophenolate (MYFORTIC) 180 MG EC tablet Take 360 mg by mouth 2 (two) times daily.      Historical Provider, MD  omeprazole (PRILOSEC) 20 MG capsule Take 20 mg by mouth daily.     Historical Provider, MD  polyethylene glycol (MIRALAX / GLYCOLAX) packet Take 17 g by mouth 2 (two) times daily as needed. For constipation    Historical Provider, MD  polyvinyl alcohol-povidone (HYPOTEARS) 1.4-0.6 % ophthalmic solution Place 1-2 drops into both eyes 4 (four) times daily as needed (dry eyes).    Historical Provider, MD  predniSONE (DELTASONE) 5 MG tablet Take 5 mg by mouth daily.     Historical Provider, MD  tacrolimus (PROGRAF) 1 MG capsule Take 4 mg by mouth every evening.     Historical Provider, MD  tacrolimus (PROGRAF) 5 MG capsule Take 5 mg by  mouth every morning.    Historical Provider, MD  warfarin (COUMADIN) 2.5 MG tablet Take 0.5 tablets (1.25 mg total) by mouth one time only at 6 PM. 08/26/14   Tasrif Ahmed, MD   BP 152/70 mmHg  Pulse 73  Temp(Src) 98.7 F (37.1 C) (Oral)  Resp 18  SpO2 99%  LMP 03/05/2014 Physical Exam  Constitutional: She appears well-developed and well-nourished. No distress.  HENT:  Head: Normocephalic and atraumatic.  Eyes: Conjunctivae are normal. Pupils are equal, round, and reactive to light.  Neck: Neck supple. No tracheal deviation present. No thyromegaly present.  Cardiovascular: Normal rate and regular rhythm.   No murmur heard. Pulmonary/Chest: Effort normal and breath sounds normal.  Coughing frequently  Abdominal: Soft. Bowel sounds are normal.  She exhibits no distension. There is no tenderness.  Musculoskeletal: Normal range of motion. She exhibits no edema or tenderness.  Neurological: She is alert. Coordination normal.  Skin: Skin is warm and dry. No rash noted.  Psychiatric: She has a normal mood and affect.  Nursing note and vitals reviewed.   ED Course  Procedures (including critical care time) Labs Review Labs Reviewed  CBC WITH DIFFERENTIAL/PLATELET  COMPREHENSIVE METABOLIC PANEL  I-STAT [place], ED    Imaging Review No results found.   EKG Interpretation   Date/Time:  Wednesday November 12 2014 14:07:00 EDT Ventricular Rate:  77 PR Interval:  202 QRS Duration: 130 QT Interval:  430 QTC Calculation: 486 R Axis:   93 Text Interpretation:  Normal sinus rhythm Right bundle branch block  Abnormal ECG No significant change since last tracing Confirmed by  Winfred Leeds  MD, Tylar Amborn [phone]) on 11/12/2014 3:39:15 PM        chest x-ray viewed by me  Results for orders placed or performed during the hospital encounter of 11/12/14  CBC with Differential  Result Value Ref Range   WBC 10.0 4.0 - 10.5 K/uL   RBC 3.66 (L) 3.87 - 5.11 MIL/uL   Hemoglobin 10.5 (L) 12.0 - 15.0  g/dL   HCT 34.0 (L) 36.0 - 46.0 %   MCV 92.9 78.0 - 100.0 fL   MCH 28.7 26.0 - 34.0 pg   MCHC 30.9 30.0 - 36.0 g/dL   RDW 15.0 11.5 - 15.5 %   Platelets 153 150 - 400 K/uL   Neutrophils Relative % 72 43 - 77 %   Neutro Abs 7.2 1.7 - 7.7 K/uL   Lymphocytes Relative 14 12 - 46 %   Lymphs Abs 1.4 0.7 - 4.0 K/uL   Monocytes Relative 10 3 - 12 %   Monocytes Absolute 1.0 0.1 - 1.0 K/uL   Eosinophils Relative 4 0 - 5 %   Eosinophils Absolute 0.4 0.0 - 0.7 K/uL   Basophils Relative 0 0 - 1 %   Basophils Absolute 0.0 0.0 - 0.1 K/uL  Comprehensive metabolic panel  Result Value Ref Range   Sodium 137 135 - 145 mmol/L   Potassium 3.9 3.5 - 5.1 mmol/L   Chloride 106 101 - 111 mmol/L   CO2 21 (L) 22 - 32 mmol/L   Glucose, Bld 136 (H) 65 - 99 mg/dL   BUN 46 (H) 6 - 20 mg/dL   Creatinine, Ser 2.63 (H) 0.44 - 1.00 mg/dL   Calcium 9.2 8.9 - 10.3 mg/dL   Total Protein 6.8 6.5 - 8.1 g/dL   Albumin 3.5 3.5 - 5.0 g/dL   AST 16 15 - 41 U/L   ALT 12 (L) 14 - 54 U/L   Alkaline Phosphatase 154 (H) 38 - 126 U/L   Total Bilirubin 0.4 0.3 - 1.2 mg/dL   GFR calc non Af Amer 17 (L) >60 mL/min   GFR calc Af Amer 20 (L) >60 mL/min   Anion gap 10 5 - 15  I-Stat Troponin, ED (not at [facility])  Result Value Ref Range   Troponin i, poc 0.00 0.00 - 0.08 ng/mL   Comment 3           Dg Chest 2 View  11/12/2014   CLINICAL DATA:  Productive cough, diffuse chest pressure, and shortness of breath since 10/27/2014. History of recent pneumonia.  EXAM: CHEST  2 VIEW  COMPARISON:  08/26/2014  FINDINGS: The cardiomediastinal silhouette is unchanged and within normal limits. Thoracic  aortic calcification is noted. There is improved aeration of the left lower lobe, however mild basilar retrocardiac opacity persists. There is no evidence of new airspace opacity elsewhere. No pulmonary edema, pleural effusion, or pneumothorax is identified. Scattered areas of scarring and fissural thickening are again noted. Calcified pleural plaque  is noted along the right hemidiaphragm. Numerous surgical clips and an IVC filter are partially visualized in the upper abdomen. Surgical clips are also noted in the lower neck.  IMPRESSION: Improved but not resolved left lower lobe airspace opacity. This may reflect residual/ recurrent pneumonia in the appropriate clinical setting, in which case continued radiographic follow-up after additional treatment would be recommended to ensure complete resolution. If this does not fully resolve, chest CT would be suggested.   Electronically Signed   By: Logan Bores   On: 11/12/2014 14:54    MDM   light of patient's worsening shortness of breath and worsening cough she's failed outpatient antibiotics therapy. Case discussed with hospital pharmacist. Suggest continue Levaquin 750 mg every other day we will add intravenous ceftriaxone and treat .  Final diagnoses:  None   Spoke with Dr.Elmahi. Plan 23 hour observation medical surgical floor intravenous antibiotics. Diagnoses #1 community-acquired pneumonia #2 anemia #3 renal insufficiency     Orlie Dakin, MD 11/12/14 1635  Orlie Dakin, MD 11/12/14 [id number]

## 2014-11-12 NOTE — ED Notes (Signed)
Pt reports productive cough, diffuse chest pressure and SOB. Ongoing for several days. Respirations unlabored upon arrival. Speaking complete sentences. Pt is alert and oriented x4. Reports 7/10 chest pressure at the time.

## 2014-11-13 DIAGNOSIS — J189 Pneumonia, unspecified organism: Secondary | ICD-10-CM

## 2014-11-13 LAB — CBC
HEMATOCRIT: 33.1 % — AB (ref 36.0–46.0)
HEMOGLOBIN: 9.9 g/dL — AB (ref 12.0–15.0)
MCH: 27.8 pg (ref 26.0–34.0)
MCHC: 29.9 g/dL — ABNORMAL LOW (ref 30.0–36.0)
MCV: 93 fL (ref 78.0–100.0)
PLATELETS: 156 10*3/uL (ref 150–400)
RBC: 3.56 MIL/uL — ABNORMAL LOW (ref 3.87–5.11)
RDW: 15.1 % (ref 11.5–15.5)
WBC: 11.1 10*3/uL — ABNORMAL HIGH (ref 4.0–10.5)

## 2014-11-13 LAB — HEMOGLOBIN A1C
HEMOGLOBIN A1C: 6.5 % — AB (ref 4.8–5.6)
MEAN PLASMA GLUCOSE: 140 mg/dL

## 2014-11-13 LAB — BASIC METABOLIC PANEL
Anion gap: 8 (ref 5–15)
BUN: 40 mg/dL — ABNORMAL HIGH (ref 6–20)
CALCIUM: 9.1 mg/dL (ref 8.9–10.3)
CO2: 23 mmol/L (ref 22–32)
Chloride: 105 mmol/L (ref 101–111)
Creatinine, Ser: 2.31 mg/dL — ABNORMAL HIGH (ref 0.44–1.00)
GFR calc non Af Amer: 20 mL/min — ABNORMAL LOW (ref >60)
GFR, EST AFRICAN AMERICAN: 23 mL/min — AB (ref >60)
GLUCOSE: 144 mg/dL — AB (ref 65–99)
Potassium: 5.2 mmol/L — ABNORMAL HIGH (ref 3.5–5.1)
Sodium: 136 mmol/L (ref 135–145)

## 2014-11-13 LAB — PROTIME-INR
INR: 1.61 — AB (ref 0.00–1.49)
PROTHROMBIN TIME: 19.2 s — AB (ref 11.6–15.2)

## 2014-11-13 MED ORDER — WARFARIN SODIUM 2.5 MG PO TABS
2.5000 mg | ORAL_TABLET | Freq: Once | ORAL | Status: DC
  Filled 2014-11-13: qty 1

## 2014-11-13 MED ORDER — EPOETIN ALFA 40000 UNIT/ML IJ SOLN
40000.0000 [IU] | INTRAMUSCULAR | Status: DC
  Administered 2014-11-13: 40000 [IU] via SUBCUTANEOUS
  Filled 2014-11-13 (×2): qty 1

## 2014-11-13 MED ORDER — SODIUM POLYSTYRENE SULFONATE 15 GM/60ML PO SUSP
30.0000 g | Freq: Once | ORAL | Status: AC
  Administered 2014-11-13: 30 g via ORAL
  Filled 2014-11-13 (×2): qty 120

## 2014-11-13 MED ORDER — GUAIFENESIN-DM 100-10 MG/5ML PO SYRP: 5.0000 mL | ORAL_SOLUTION | ORAL | Status: DC | PRN

## 2014-11-13 MED ORDER — LEVOFLOXACIN 750 MG PO TABS: 750.0000 mg | ORAL_TABLET | ORAL | Status: DC

## 2014-11-13 NOTE — Progress Notes (Signed)
ANTICOAGULATION CONSULT NOTE - Follow Up Consult  Pharmacy Consult for Coumadin Indication: atrial fibrillation, hx of dvt  Allergies  Allergen Reactions  . Amoxicillin Anaphylaxis  . Ampicillin Anaphylaxis  . Ciprofloxacin Swelling    "of my throat"  . Erythromycin Anaphylaxis  . Penicillins Anaphylaxis  . Sulfa Antibiotics Itching  . Sulfamethoxazole-Trimethoprim Itching  . Tetracycline Anaphylaxis  . Labetalol Nausea And Vomiting    Patient Measurements: Height: 5' 3"  (160 cm) Weight: 133 lb 14.4 oz (60.737 kg) IBW/kg (Calculated) : 52.4  Vital Signs: Temp: 98.2 F (36.8 C) (07/07 0522) Temp Source: Oral (07/07 0522) BP: 92/50 mmHg (07/07 1018) Pulse Rate: 82 (07/07 1016)  Labs:  Recent Labs  11/12/14 1413 11/12/14 1818 11/13/14 0357  HGB 10.5*  --  9.9*  HCT 34.0*  --  33.1*  PLT 153  --  156  LABPROT  --  19.5* 19.2*  INR  --  1.65* 1.61*  CREATININE 2.63*  --  2.31*    Estimated Creatinine Clearance: 18.5 mL/min (by C-G formula based on Cr of 2.31).   Medications:  Scheduled:  . amiodarone  50 mg Oral BID  . calcitRIOL  0.25 mcg Oral QODAY  . dextromethorphan-guaiFENesin  1 tablet Oral BID  . [START ON 11/29/2014] epoetin alfa  40,000 Units Subcutaneous Q21 days  . feeding supplement (ENSURE ENLIVE)  237 mL Oral BID BM  . furosemide  40 mg Oral Daily  . gabapentin  200 mg Oral QHS  . heparin  5,000 Units Subcutaneous 3 times per day  . ipratropium-albuterol  3 mL Nebulization BID  . levofloxacin  750 mg Oral Q48H  . levothyroxine  100 mcg Oral QAC breakfast  . magnesium oxide  400 mg Oral BID  . metoprolol tartrate  25 mg Oral BID  . mycophenolate  360 mg Oral BID  . pantoprazole  40 mg Oral Daily  . predniSONE  5 mg Oral Daily  . sodium chloride  3 mL Intravenous Q12H  . sodium polystyrene  30 g Oral Once  . tacrolimus  4 mg Oral QPM  . tacrolimus  5 mg Oral q morning - 10a  . warfarin  2.5 mg Oral ONCE-1800  . Warfarin - Pharmacist Dosing  Inpatient   Does not apply q1800    Assessment: Pt is a 74 YO female admitted for CAP. Pharmacy to dose coumadin for afib and hx of DVT. INR 1.61, Hgb 9.9, Plts 156. No s/sx of bleeding reported. Levaquin may potentiate INR.   Goal of Therapy:  INR 2-3 Monitor platelets by anticoagulation protocol: Yes   Plan:  - Administer coumadin 2.5 mg x1. - F/u INR daily.   Roma Schanz 11/13/2014,10:47 AM

## 2014-11-13 NOTE — Care Management Note (Signed)
Case Management Note  Patient Details  Name: MAZIYAH VESSEL MRN: [id number] Date of Birth: Sep 30, 1940  Subjective/Objective:                    Action/Plan: No needs identified  Expected Discharge Date:                  Expected Discharge Plan:  Home/Self Care (Lives with spouse)  In-House Referral:     Discharge planning Services  CM Consult  Post Acute Care Choice:    Choice offered to:     DME Arranged:    DME Agency:     HH Arranged:    [place] Agency:     Status of Service:  Completed, signed off  Medicare Important Message Given:    Date Medicare IM Given:    Medicare IM give by:    Date Additional Medicare IM Given:    Additional Medicare Important Message give by:     If discussed at [place] of Stay Meetings, dates discussed:    Additional Comments:  Carles Collet, RN 11/13/2014, 10:47 AM

## 2014-11-13 NOTE — Discharge Summary (Signed)
Physician Discharge Summary  ALEA RYER MRN: [id number] DOB/AGE: April 19, 1941 74 y.o.  PCP: Placido Sou, MD   Admit date: 11/12/2014 Discharge date: 11/13/2014  Discharge Diagnoses:   Principal Problem:   Community acquired pneumonia Active Problems:   Atrial fibrillation   CKD (chronic kidney disease), stage IV   History of DVT (deep vein thrombosis)   Anemia of chronic disease    Follow-up recommendations Follow-up with PCP in 3-5 days , including although additional recommended appointments as below Follow-up CBC, CMP, INR in 3-5 days      Medication List    TAKE these medications        acetaminophen 325 MG tablet  Commonly known as:  TYLENOL  Take 650 mg by mouth every 6 (six) hours as needed for mild pain.     allopurinol 100 MG tablet  Commonly known as:  ZYLOPRIM  Take 100 mg by mouth 2 (two) times daily.     amiodarone 100 MG tablet  Commonly known as:  PACERONE  Take 0.5 tablets by mouth 2 (two) times daily.     calcitRIOL 0.25 MCG capsule  Commonly known as:  ROCALTROL  Take 0.25 mcg by mouth every other day.     dextromethorphan-guaiFENesin 30-600 MG per 12 hr tablet  Commonly known as:  MUCINEX DM  Take 1 tablet by mouth 2 (two) times daily.     guaiFENesin-dextromethorphan 100-10 MG/5ML syrup  Commonly known as:  ROBITUSSIN DM  Take 5 mLs by mouth every 4 (four) hours as needed for cough.     epoetin alfa 40000 UNIT/ML injection  Commonly known as:  EPOGEN,PROCRIT  Inject 40,000 Units into the skin every 21 ( twenty-one) days.     feeding supplement (ENSURE ENLIVE) Liqd  Take 237 mLs by mouth 2 (two) times daily between meals.     furosemide 40 MG tablet  Commonly known as:  LASIX  Take 40 mg by mouth daily.     gabapentin 100 MG capsule  Commonly known as:  NEURONTIN  Take 200 mg by mouth at bedtime.     levofloxacin 750 MG tablet  Commonly known as:  LEVAQUIN  Take 1 tablet (750 mg total) by mouth every other day. Started  medication on 11-08-14     levothyroxine 100 MCG tablet  Commonly known as:  SYNTHROID, LEVOTHROID  Take 100 mcg by mouth daily.     MAGNESIUM-OXIDE 400 (241.3 MG) MG tablet  Generic drug:  magnesium oxide  Take 400 mg by mouth 2 (two) times daily.     metoprolol tartrate 25 MG tablet  Commonly known as:  LOPRESSOR  Take 1 tablet by mouth 2 (two) times daily.     mycophenolate 180 MG EC tablet  Commonly known as:  MYFORTIC  Take 360 mg by mouth 2 (two) times daily.     omeprazole 20 MG capsule  Commonly known as:  PRILOSEC  Take 20 mg by mouth daily.     polyethylene glycol packet  Commonly known as:  MIRALAX / GLYCOLAX  Take 17 g by mouth 2 (two) times daily as needed. For constipation     polyvinyl alcohol-povidone 1.4-0.6 % ophthalmic solution  Commonly known as:  HYPOTEARS  Place 1-2 drops into both eyes 4 (four) times daily as needed (dry eyes).     predniSONE 5 MG tablet  Commonly known as:  DELTASONE  Take 5 mg by mouth daily.     tacrolimus 5 MG capsule  Commonly known as:  PROGRAF  Take 5 mg by mouth every morning.     tacrolimus 1 MG capsule  Commonly known as:  PROGRAF  Take 4 mg by mouth every evening.     warfarin 2.5 MG tablet  Commonly known as:  COUMADIN  Take 1.25-2.5 mg by mouth See admin instructions. Take 2.26m tablet every day except on Thursdays take 1/2 of the 2.549m        Discharge Condition: Stable    Disposition: 01-Home or Self Care   Consults: None     Significant Diagnostic Studies:  Dg Chest 2 View  11/12/2014   CLINICAL DATA:  Productive cough, diffuse chest pressure, and shortness of breath since 10/27/2014. History of recent pneumonia.  EXAM: CHEST  2 VIEW  COMPARISON:  08/26/2014  FINDINGS: The cardiomediastinal silhouette is unchanged and within normal limits. Thoracic aortic calcification is noted. There is improved aeration of the left lower lobe, however mild basilar retrocardiac opacity persists. There is no  evidence of new airspace opacity elsewhere. No pulmonary edema, pleural effusion, or pneumothorax is identified. Scattered areas of scarring and fissural thickening are again noted. Calcified pleural plaque is noted along the right hemidiaphragm. Numerous surgical clips and an IVC filter are partially visualized in the upper abdomen. Surgical clips are also noted in the lower neck.  IMPRESSION: Improved but not resolved left lower lobe airspace opacity. This may reflect residual/ recurrent pneumonia in the appropriate clinical setting, in which case continued radiographic follow-up after additional treatment would be recommended to ensure complete resolution. If this does not fully resolve, chest CT would be suggested.   Electronically Signed   By: AlLogan Bores On: 11/12/2014 14:54        Filed Weights   11/12/14 1711  Weight: 60.737 kg (133 lb 14.4 oz)     Microbiology: No results found for this or any previous visit (from the past 240 hour(s)).     Blood Culture    Component Value Date/Time   SDES SPUTUM 08/21/2014 1507   SDES SPUTUM 08/21/2014 1507   SPECREQUEST Immunocompromised 08/21/2014 1507   SPECREQUEST NONE 08/21/2014 1507   CULT  08/21/2014 1507    NORMAL OROPHARYNGEAL FLORA Performed at [facility] FINAL 08/21/2014 1507   REPTSTATUS 08/24/2014 FINAL 08/21/2014 1507      Labs: Results for orders placed or performed during the hospital encounter of 11/12/14 (from the past 48 hour(s))  CBC with Differential     Status: Abnormal   Collection Time: 11/12/14  2:13 PM  Result Value Ref Range   WBC 10.0 4.0 - 10.5 K/uL   RBC 3.66 (L) 3.87 - 5.11 MIL/uL   Hemoglobin 10.5 (L) 12.0 - 15.0 g/dL   HCT 34.0 (L) 36.0 - 46.0 %   MCV 92.9 78.0 - 100.0 fL   MCH 28.7 26.0 - 34.0 pg   MCHC 30.9 30.0 - 36.0 g/dL   RDW 15.0 11.5 - 15.5 %   Platelets 153 150 - 400 K/uL   Neutrophils Relative % 72 43 - 77 %   Neutro Abs 7.2 1.7 - 7.7 K/uL    Lymphocytes Relative 14 12 - 46 %   Lymphs Abs 1.4 0.7 - 4.0 K/uL   Monocytes Relative 10 3 - 12 %   Monocytes Absolute 1.0 0.1 - 1.0 K/uL   Eosinophils Relative 4 0 - 5 %   Eosinophils Absolute 0.4 0.0 - 0.7 K/uL   Basophils Relative 0 0 -  1 %   Basophils Absolute 0.0 0.0 - 0.1 K/uL  Comprehensive metabolic panel     Status: Abnormal   Collection Time: 11/12/14  2:13 PM  Result Value Ref Range   Sodium 137 135 - 145 mmol/L   Potassium 3.9 3.5 - 5.1 mmol/L   Chloride 106 101 - 111 mmol/L   CO2 21 (L) 22 - 32 mmol/L   Glucose, Bld 136 (H) 65 - 99 mg/dL   BUN 46 (H) 6 - 20 mg/dL   Creatinine, Ser 2.63 (H) 0.44 - 1.00 mg/dL   Calcium 9.2 8.9 - 10.3 mg/dL   Total Protein 6.8 6.5 - 8.1 g/dL   Albumin 3.5 3.5 - 5.0 g/dL   AST 16 15 - 41 U/L   ALT 12 (L) 14 - 54 U/L   Alkaline Phosphatase 154 (H) 38 - 126 U/L   Total Bilirubin 0.4 0.3 - 1.2 mg/dL   GFR calc non Af Amer 17 (L) >60 mL/min   GFR calc Af Amer 20 (L) >60 mL/min    Comment: (NOTE) The eGFR has been calculated using the CKD EPI equation. This calculation has not been validated in all clinical situations. eGFR's persistently <60 mL/min signify possible Chronic Kidney Disease.    Anion gap 10 5 - 15  I-Stat Troponin, ED (not at [facility])     Status: None   Collection Time: 11/12/14  2:21 PM  Result Value Ref Range   Troponin i, poc 0.00 0.00 - 0.08 ng/mL   Comment 3            Comment: Due to the release kinetics of cTnI, a negative result within the first hours of the onset of symptoms does not rule out myocardial infarction with certainty. If myocardial infarction is still suspected, repeat the test at appropriate intervals.   Protime-INR     Status: Abnormal   Collection Time: 11/12/14  6:18 PM  Result Value Ref Range   Prothrombin Time 19.5 (H) 11.6 - 15.2 seconds   INR 1.65 (H) 0.00 - 1.49  TSH     Status: None   Collection Time: 11/12/14  6:18 PM  Result Value Ref Range   TSH 0.706 0.350 - 4.500 uIU/mL   Hemoglobin A1c     Status: Abnormal   Collection Time: 11/12/14  6:18 PM  Result Value Ref Range   Hgb A1c MFr Bld 6.5 (H) 4.8 - 5.6 %    Comment: (NOTE)         Pre-diabetes: 5.7 - 6.4         Diabetes: >6.4         Glycemic control for adults with diabetes: <7.0    Mean Plasma Glucose 140 mg/dL    Comment: (NOTE) Performed At: [facility] [place], [place] [id number] Lindon Romp MD [id number]   Basic metabolic panel     Status: Abnormal   Collection Time: 11/13/14  3:57 AM  Result Value Ref Range   Sodium 136 135 - 145 mmol/L   Potassium 5.2 (H) 3.5 - 5.1 mmol/L    Comment: DELTA CHECK NOTED   Chloride 105 101 - 111 mmol/L   CO2 23 22 - 32 mmol/L   Glucose, Bld 144 (H) 65 - 99 mg/dL   BUN 40 (H) 6 - 20 mg/dL   Creatinine, Ser 2.31 (H) 0.44 - 1.00 mg/dL   Calcium 9.1 8.9 - 10.3 mg/dL   GFR calc non Af Amer 20 (L) >60 mL/min  GFR calc Af Amer 23 (L) >60 mL/min    Comment: (NOTE) The eGFR has been calculated using the CKD EPI equation. This calculation has not been validated in all clinical situations. eGFR's persistently <60 mL/min signify possible Chronic Kidney Disease.    Anion gap 8 5 - 15  CBC     Status: Abnormal   Collection Time: 11/13/14  3:57 AM  Result Value Ref Range   WBC 11.1 (H) 4.0 - 10.5 K/uL   RBC 3.56 (L) 3.87 - 5.11 MIL/uL   Hemoglobin 9.9 (L) 12.0 - 15.0 g/dL   HCT 33.1 (L) 36.0 - 46.0 %   MCV 93.0 78.0 - 100.0 fL   MCH 27.8 26.0 - 34.0 pg   MCHC 29.9 (L) 30.0 - 36.0 g/dL   RDW 15.1 11.5 - 15.5 %   Platelets 156 150 - 400 K/uL  Protime-INR     Status: Abnormal   Collection Time: 11/13/14  3:57 AM  Result Value Ref Range   Prothrombin Time 19.2 (H) 11.6 - 15.2 seconds   INR 1.61 (H) 0.00 - 1.49     Lipid Panel     Component Value Date/Time   CHOL  07/19/2008 0200    124        ATP III CLASSIFICATION:  <200     mg/dL   Desirable  200-239  mg/dL   Borderline High  >=240    mg/dL   High          TRIG  89 07/19/2008 0200   HDL 34* 07/19/2008 0200   CHOLHDL 3.6 07/19/2008 0200   VLDL 18 07/19/2008 0200   LDLCALC  07/19/2008 0200    72        Total Cholesterol/HDL:CHD Risk Coronary Heart Disease Risk Table                     Men   Women  1/2 Average Risk   3.4   3.3  Average Risk       5.0   4.4  2 X Average Risk   9.6   7.1  3 X Average Risk  23.4   11.0        Use the calculated Patient Ratio above and the CHD Risk Table to determine the patient's CHD Risk.        ATP III CLASSIFICATION (LDL):  <100     mg/dL   Optimal  100-129  mg/dL   Near or Above                    Optimal  130-159  mg/dL   Borderline  160-189  mg/dL   High  >190     mg/dL   Very High     Lab Results  Component Value Date   HGBA1C 6.5* 11/12/2014   HGBA1C  04/23/2008    5.7 (NOTE)   The ADA recommends the following therapeutic goal for glycemic   control related to Hgb A1C measurement:   Goal of Therapy:   < 7.0% Hgb A1C   Reference: American Diabetes Association: Clinical Practice   Recommendations 2008, Diabetes Care,  2008, 31:(Suppl 1).     Lab Results  Component Value Date   [facility]  07/19/2008    72        Total Cholesterol/HDL:CHD Risk Coronary Heart Disease Risk Table                     Men  Women  1/2 Average Risk   3.4   3.3  Average Risk       5.0   4.4  2 X Average Risk   9.6   7.1  3 X Average Risk  23.4   11.0        Use the calculated Patient Ratio above and the CHD Risk Table to determine the patient's CHD Risk.        ATP III CLASSIFICATION (LDL):  <100     mg/dL   Optimal  100-129  mg/dL   Near or Above                    Optimal  130-159  mg/dL   Borderline  160-189  mg/dL   High  >190     mg/dL   Very High   CREATININE 2.31* 11/13/2014     HPI :[name] is a 74 y.o. female past medical history of renal transplant, patient is on Myfortic, Prograf and prednisone, she also has Crohn's disease, history of atrial fibrillation and DVT she is on  anticoagulation, she is a Restaurant manager, fast food. Patient came into the hospital for Procrit injection and directed by the short stay nursing staff to the ED to get admitted. Patient reported that she had pneumonia back in April treated for that with antibiotics, patient about 2 weeks ago developed same cough with some sputum production and shortness of breath. Levofloxacin was prescribed, she started taking that on Friday, she thought she is doing better but she came in earlier today to the short stay to get Procrit injection and the nursing staff directed her to the ED for further evaluation. In the ED chest x-ray showed left-sided recurrent pneumonia, patient denies fever but she still has cough, sputum production and SOB.   HOSPITAL COURSE:  *Pneumonia Left lower lobe community-acquired pneumonia, likely recurrent. Patient had 2 or 3 doses of her levofloxacin, continue same dose of 750 every other day. Add vancomycin per pharmacy. Continue supportive management with broncho-dilators, mucolytics and antitussives as well as oxygen. Blood culture no growth so far, patient remains afebrile, DC vancomycin, continue Levaquin for another 3 doses every 48 hours to completely treat the pneumonia   ESRD status post renal transplant Currently on Myfortic, Prograf and prednisone, doing okay. Creatinine stable   Atrial fibrillation Rate is controlled with beta blockers, she is on Coumadin. Pharmacy for dosage, check INR in 2-3 days given interaction with Levaquin This patients CHA2DS2-VASc Score and unadjusted Ischemic Stroke Rate (% per year) is equal to 3.2 % stroke rate/year from a score of 3 for HTN, female gender and age of 39. Above score calculated as 1 point each if present [CHF, HTN, DM, Vascular=MI/PAD/Aortic Plaque, Age if 65-74, or Female] Above score calculated as 2 points each if present [Age > 75, or Stroke/TIA/TE]  History of DVT Patient is on Coumadin anyway for her atrial  fibrillation.  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 Supposed to get Procrit shot today, ordered.       Discharge Exam:  Blood pressure 92/50, pulse 82, temperature 98.2 F (36.8 C), temperature source Oral, resp. rate 15, height 5' 3"  (1.6 m), weight 60.737 kg (133 lb 14.4 oz), last menstrual period 03/05/2014, SpO2 98 %. Cardiovascular: Normal rate, regular rhythm, S1 normal, S2 normal, normal heart sounds and intact distal pulses.  Pulmonary/Chest: Effort normal and breath sounds normal.  Abdominal: Soft. Bowel  sounds are normal. There is no hepatosplenomegaly. There is no tenderness.  Musculoskeletal: Normal range of motion.  Neurological: Alert and oriented to person, place, and time. Has normal strength. No cranial nerve deficit or sensory deficit.  Skin: Skin is warm, dry and intact.  Psychiatric: Has a normal mood and affect. Speech is normal and behavior is normal.          Discharge Instructions    Diet - low sodium heart healthy    Complete by:  As directed      Increase activity slowly    Complete by:  As directed            Follow-up Information    Follow up with DETERDING,[name], MD. Schedule an appointment as soon as possible for a visit in 1 week.   Specialty:  Nephrology   Contact information:   [place] [address] [phone]       Follow up with DETERDING,[name], MD.   Specialty:  Nephrology   Contact information:   [place] [address] [phone]       Signed: Reyne Dumas 11/13/2014, 10:19 AM        Time spent >45 mins

## 2014-11-13 NOTE — Progress Notes (Signed)
[name] to be D/C'd Home per MD order.  Discussed with the patient and all questions fully answered.  VSS, Skin clean, dry and intact without evidence of skin break down, no evidence of skin tears noted. IV catheter discontinued intact. Site without signs and symptoms of complications. Dressing and pressure applied.  An After Visit Summary was printed and given to the patient. Patient received prescription.  D/c education completed with patient/family including follow up instructions, medication list, d/c activities limitations if indicated, with other d/c instructions as indicated by MD - patient able to verbalize understanding, all questions fully answered.   Patient instructed to return to ED, call 911, or call MD for any changes in condition.   Patient escorted via [place], and D/C home via private auto.  L'ESPERANCE, Areen Trautner C 11/13/2014 12:35 PM

## 2014-11-13 NOTE — Progress Notes (Signed)
  Patient Saturations on Room Air at Rest = 99%  Patient Saturations on [employer] while Ambulating = 98%

## 2014-11-17 LAB — CULTURE, BLOOD (ROUTINE X 2)
Culture: NO GROWTH
Culture: NO GROWTH

## 2014-11-24 DIAGNOSIS — I129 Hypertensive chronic kidney disease with stage 1 through stage 4 chronic kidney disease, or unspecified chronic kidney disease: Secondary | ICD-10-CM | POA: Diagnosis not present

## 2014-11-24 DIAGNOSIS — Z940 Kidney transplant status: Secondary | ICD-10-CM | POA: Diagnosis not present

## 2014-11-24 DIAGNOSIS — D631 Anemia in chronic kidney disease: Secondary | ICD-10-CM | POA: Diagnosis not present

## 2014-11-24 DIAGNOSIS — N184 Chronic kidney disease, stage 4 (severe): Secondary | ICD-10-CM | POA: Diagnosis not present

## 2014-12-03 ENCOUNTER — Encounter (HOSPITAL_COMMUNITY)
Admission: RE | Admit: 2014-12-03 | Discharge: 2014-12-03 | Disposition: A | Discharge status: Home / Self Care | Payer: Medicare Other | Source: Ambulatory Visit | Attending: Nephrology | Admitting: Nephrology

## 2014-12-03 DIAGNOSIS — H1132 Conjunctival hemorrhage, left eye: Secondary | ICD-10-CM | POA: Diagnosis not present

## 2014-12-03 DIAGNOSIS — D631 Anemia in chronic kidney disease: Secondary | ICD-10-CM | POA: Diagnosis not present

## 2014-12-03 DIAGNOSIS — N183 Chronic kidney disease, stage 3 (moderate): Secondary | ICD-10-CM | POA: Diagnosis not present

## 2014-12-03 LAB — RENAL FUNCTION PANEL
Albumin: 3.8 g/dL (ref 3.5–5.0)
Anion gap: 8 (ref 5–15)
BUN: 46 mg/dL — ABNORMAL HIGH (ref 6–20)
CALCIUM: 9.1 mg/dL (ref 8.9–10.3)
CHLORIDE: 107 mmol/L (ref 101–111)
CO2: 22 mmol/L (ref 22–32)
Creatinine, Ser: 2.11 mg/dL — ABNORMAL HIGH (ref 0.44–1.00)
GFR calc non Af Amer: 22 mL/min — ABNORMAL LOW (ref >60)
GFR, EST AFRICAN AMERICAN: 26 mL/min — AB (ref >60)
Glucose, Bld: 119 mg/dL — ABNORMAL HIGH (ref 65–99)
PHOSPHORUS: 4.1 mg/dL (ref 2.5–4.6)
Potassium: 5.1 mmol/L (ref 3.5–5.1)
SODIUM: 137 mmol/L (ref 135–145)

## 2014-12-03 LAB — FERRITIN: FERRITIN: 454 ng/mL — AB (ref 11–307)

## 2014-12-03 LAB — IRON AND TIBC
Iron: 107 ug/dL (ref 28–170)
SATURATION RATIOS: 37 % — AB (ref 10.4–31.8)
TIBC: 287 ug/dL (ref 250–450)
UIBC: 180 ug/dL

## 2014-12-03 LAB — POCT HEMOGLOBIN-HEMACUE: HEMOGLOBIN: 11.3 g/dL — AB (ref 12.0–15.0)

## 2014-12-03 MED ORDER — EPOETIN ALFA 40000 UNIT/ML IJ SOLN
INTRAMUSCULAR | Status: AC
Start: 2014-12-03 — End: 2014-12-03
  Administered 2014-12-03: 40000 [IU]
  Filled 2014-12-03: qty 1

## 2014-12-03 MED ORDER — EPOETIN ALFA 10000 UNIT/ML IJ SOLN: 40000.0000 [IU] | INTRAMUSCULAR | Status: DC

## 2014-12-08 DIAGNOSIS — Z940 Kidney transplant status: Secondary | ICD-10-CM | POA: Diagnosis not present

## 2014-12-08 DIAGNOSIS — I82409 Acute embolism and thrombosis of unspecified deep veins of unspecified lower extremity: Secondary | ICD-10-CM | POA: Diagnosis not present

## 2014-12-08 LAB — PROTIME-INR: INR: 2.1 — AB (ref 0.9–1.1)

## 2014-12-11 ENCOUNTER — Ambulatory Visit (INDEPENDENT_AMBULATORY_CARE_PROVIDER_SITE_OTHER): Payer: Medicare Other | Admitting: Internal Medicine

## 2014-12-11 ENCOUNTER — Encounter: Payer: Self-pay | Admitting: Internal Medicine

## 2014-12-11 VITALS — BP 158/66 | HR 64 | Ht 63.0 in | Wt 134.0 lb

## 2014-12-11 DIAGNOSIS — G4733 Obstructive sleep apnea (adult) (pediatric): Secondary | ICD-10-CM | POA: Diagnosis not present

## 2014-12-11 NOTE — Patient Instructions (Signed)
We can continue CPAP 10/ [facility]   Please call as needed

## 2014-12-11 NOTE — Progress Notes (Signed)
08/12/13- 19 yoF Former patient-sleep study back in 90's. Currently on CPAP set on 10 through [place]. Medical problems include lupus, hypertension, chronic kidney disease/transplant, blood clots, Crohn's. She is using a nasal mask. Wakes with dry mouth despite humidifier. Bedtime 10 PM, sleep latency 30-40 minutes, waking several times before up at 7 AM. Epworth sleepiness score 9/24  12/11/14- 23 yoF Former patient-sleep study back in 90's. Currently on CPAP set on 10 through [place]. Medical problems include lupus, hypertension, chronic kidney disease/transplant, blood clots, Crohn's. FOLLOWS FOR: Review sleep study results with patient. NPSG 10/15/13 58.5/ hr. Weight 130 lbs   We had [facility] restart CPAP 10 we reviewed sleep study. She does qualified to continue CPAP. [facility]/CPAP 10  ROS-see HPI Constitutional:   No-   weight loss, night sweats, fevers, chills, fatigue, lassitude. HEENT:   No-  headaches, difficulty swallowing, tooth/dental problems, sore throat,       No-  sneezing, itching, ear ache, nasal congestion, post nasal drip,  CV:  No-   chest pain, orthopnea, PND, swelling in lower extremities, anasarca,                                                           dizziness, palpitations Resp: No-   shortness of breath with exertion or at rest.              No-   productive cough,  No non-productive cough,  No- coughing up of blood.              No-   change in color of mucus.  No- wheezing.   Skin: No-   rash or lesions. GI:  No-   heartburn, indigestion, abdominal pain, nausea, vomiting,  GU:  MS:  No-   joint pain or swelling.   Neuro-     nothing unusual Psych:  No- change in mood or affect. No depression or anxiety.  No memory loss.  OBJ- Physical Exam General- Alert, Oriented, Affect-appropriate, Distress- none acute Skin- rash-none, lesions- none, excoriation- none Lymphadenopathy- none Head- atraumatic            Eyes- Gross vision intact, PERRLA, conjunctivae and  secretions clear            Ears- Hearing, canals-normal            Nose- +mucus bridging, no-Septal dev, mucus, polyps, erosion, perforation             Throat- Mallampati II-III , mucosa -Not dry , drainage- none, tonsils- atrophic Neck- flexible , trachea midline, no stridor , thyroid nl, carotid no bruit Chest - symmetrical excursion , unlabored           Heart/CV- RRR , no murmur , no gallop  , no rub, nl s1 s2                           - JVD- none , edema- none, stasis changes- none, varices- none           Lung- clear to P&A, wheeze- none, cough- none , dullness-none, rub- none           Chest wall-  Abd- Br/ Gen/ Rectal- Not done, not indicated Extrem- cyanosis- none, clubbing, none, atrophy- none, strength- nl Neuro- grossly  intact to observation

## 2014-12-15 DIAGNOSIS — N390 Urinary tract infection, site not specified: Secondary | ICD-10-CM | POA: Diagnosis not present

## 2014-12-15 DIAGNOSIS — R399 Unspecified symptoms and signs involving the genitourinary system: Secondary | ICD-10-CM | POA: Diagnosis not present

## 2014-12-21 NOTE — Assessment & Plan Note (Signed)
She has been compliant and comfortable with CPAP long-term. Now requalified based on updated sleep study. Plan-download for documentation

## 2014-12-21 NOTE — Addendum Note (Signed)
Addended by: Baird Lyons D on: 12/21/2014 04:53 PM   Modules accepted: Miquel Dunn

## 2014-12-22 DIAGNOSIS — Z940 Kidney transplant status: Secondary | ICD-10-CM | POA: Diagnosis not present

## 2014-12-22 DIAGNOSIS — N184 Chronic kidney disease, stage 4 (severe): Secondary | ICD-10-CM | POA: Diagnosis not present

## 2014-12-22 DIAGNOSIS — I129 Hypertensive chronic kidney disease with stage 1 through stage 4 chronic kidney disease, or unspecified chronic kidney disease: Secondary | ICD-10-CM | POA: Diagnosis not present

## 2014-12-22 DIAGNOSIS — Z86718 Personal history of other venous thrombosis and embolism: Secondary | ICD-10-CM | POA: Diagnosis not present

## 2014-12-22 DIAGNOSIS — N2581 Secondary hyperparathyroidism of renal origin: Secondary | ICD-10-CM | POA: Diagnosis not present

## 2014-12-22 DIAGNOSIS — D631 Anemia in chronic kidney disease: Secondary | ICD-10-CM | POA: Diagnosis not present

## 2014-12-24 ENCOUNTER — Encounter (HOSPITAL_COMMUNITY): Payer: Medicare Other

## 2014-12-31 DIAGNOSIS — Z86718 Personal history of other venous thrombosis and embolism: Secondary | ICD-10-CM | POA: Diagnosis not present

## 2014-12-31 DIAGNOSIS — Z940 Kidney transplant status: Secondary | ICD-10-CM | POA: Diagnosis not present

## 2015-01-13 ENCOUNTER — Other Ambulatory Visit (HOSPITAL_COMMUNITY): Payer: Self-pay | Admitting: *Deleted

## 2015-01-14 ENCOUNTER — Encounter (HOSPITAL_COMMUNITY)
Admission: RE | Admit: 2015-01-14 | Discharge: 2015-01-14 | Disposition: A | Discharge status: Home / Self Care | Payer: Medicare Other | Source: Ambulatory Visit | Attending: Nephrology | Admitting: Nephrology

## 2015-01-14 DIAGNOSIS — D631 Anemia in chronic kidney disease: Secondary | ICD-10-CM | POA: Diagnosis not present

## 2015-01-14 DIAGNOSIS — N183 Chronic kidney disease, stage 3 (moderate): Secondary | ICD-10-CM | POA: Insufficient documentation

## 2015-01-14 LAB — RENAL FUNCTION PANEL
ANION GAP: 9 (ref 5–15)
Albumin: 3.7 g/dL (ref 3.5–5.0)
BUN: 50 mg/dL — ABNORMAL HIGH (ref 6–20)
CO2: 23 mmol/L (ref 22–32)
Calcium: 9.2 mg/dL (ref 8.9–10.3)
Chloride: 105 mmol/L (ref 101–111)
Creatinine, Ser: 2.39 mg/dL — ABNORMAL HIGH (ref 0.44–1.00)
GFR calc Af Amer: 22 mL/min — ABNORMAL LOW (ref >60)
GFR calc non Af Amer: 19 mL/min — ABNORMAL LOW (ref >60)
GLUCOSE: 89 mg/dL (ref 65–99)
PHOSPHORUS: 3.8 mg/dL (ref 2.5–4.6)
Potassium: 4.6 mmol/L (ref 3.5–5.1)
Sodium: 137 mmol/L (ref 135–145)

## 2015-01-14 LAB — IRON AND TIBC
Iron: 85 ug/dL (ref 28–170)
SATURATION RATIOS: 38 % — AB (ref 10.4–31.8)
TIBC: 224 ug/dL — AB (ref 250–450)
UIBC: 139 ug/dL

## 2015-01-14 LAB — FERRITIN: Ferritin: 948 ng/mL — ABNORMAL HIGH (ref 11–307)

## 2015-01-14 LAB — PROTIME-INR
INR: 1.86 — ABNORMAL HIGH (ref 0.00–1.49)
Prothrombin Time: 21.3 seconds — ABNORMAL HIGH (ref 11.6–15.2)

## 2015-01-14 LAB — POCT HEMOGLOBIN-HEMACUE: Hemoglobin: 10.9 g/dL — ABNORMAL LOW (ref 12.0–15.0)

## 2015-01-14 MED ORDER — EPOETIN ALFA 40000 UNIT/ML IJ SOLN
INTRAMUSCULAR | Status: AC
  Administered 2015-01-14: 40000 [IU] via SUBCUTANEOUS
  Filled 2015-01-14: qty 1

## 2015-01-14 MED ORDER — EPOETIN ALFA 10000 UNIT/ML IJ SOLN: 40000.0000 [IU] | INTRAMUSCULAR | Status: DC

## 2015-02-04 ENCOUNTER — Encounter (HOSPITAL_COMMUNITY)
Admission: RE | Admit: 2015-02-04 | Discharge: 2015-02-04 | Disposition: A | Discharge status: Home / Self Care | Payer: Medicare Other | Source: Ambulatory Visit | Attending: Nephrology | Admitting: Nephrology

## 2015-02-04 DIAGNOSIS — D631 Anemia in chronic kidney disease: Secondary | ICD-10-CM | POA: Diagnosis not present

## 2015-02-04 DIAGNOSIS — N183 Chronic kidney disease, stage 3 (moderate): Secondary | ICD-10-CM | POA: Diagnosis not present

## 2015-02-04 LAB — POCT HEMOGLOBIN-HEMACUE: Hemoglobin: 11.1 g/dL — ABNORMAL LOW (ref 12.0–15.0)

## 2015-02-04 MED ORDER — EPOETIN ALFA 40000 UNIT/ML IJ SOLN
INTRAMUSCULAR | Status: AC
  Administered 2015-02-04: 40000 [IU] via SUBCUTANEOUS
  Filled 2015-02-04: qty 1

## 2015-02-04 MED ORDER — EPOETIN ALFA 10000 UNIT/ML IJ SOLN: 40000.0000 [IU] | INTRAMUSCULAR | Status: DC

## 2015-02-25 ENCOUNTER — Encounter (HOSPITAL_COMMUNITY): Payer: BLUE CROSS/BLUE SHIELD

## 2015-02-25 ENCOUNTER — Encounter (HOSPITAL_COMMUNITY)
Admission: RE | Admit: 2015-02-25 | Discharge: 2015-02-25 | Disposition: A | Discharge status: Home / Self Care | Payer: Medicare Other | Source: Ambulatory Visit | Attending: Nephrology | Admitting: Nephrology

## 2015-02-25 DIAGNOSIS — D631 Anemia in chronic kidney disease: Secondary | ICD-10-CM | POA: Diagnosis not present

## 2015-02-25 DIAGNOSIS — N183 Chronic kidney disease, stage 3 (moderate): Secondary | ICD-10-CM | POA: Diagnosis not present

## 2015-02-25 LAB — RENAL FUNCTION PANEL
ALBUMIN: 3.9 g/dL (ref 3.5–5.0)
ANION GAP: 9 (ref 5–15)
BUN: 44 mg/dL — ABNORMAL HIGH (ref 6–20)
CALCIUM: 9.5 mg/dL (ref 8.9–10.3)
CO2: 22 mmol/L (ref 22–32)
Chloride: 107 mmol/L (ref 101–111)
Creatinine, Ser: 2.3 mg/dL — ABNORMAL HIGH (ref 0.44–1.00)
GFR, EST AFRICAN AMERICAN: 23 mL/min — AB (ref >60)
GFR, EST NON AFRICAN AMERICAN: 20 mL/min — AB (ref >60)
Glucose, Bld: 74 mg/dL (ref 65–99)
PHOSPHORUS: 3.6 mg/dL (ref 2.5–4.6)
Potassium: 4.5 mmol/L (ref 3.5–5.1)
SODIUM: 138 mmol/L (ref 135–145)

## 2015-02-25 LAB — IRON AND TIBC
Iron: 138 ug/dL (ref 28–170)
Saturation Ratios: 55 % — ABNORMAL HIGH (ref 10.4–31.8)
TIBC: 252 ug/dL (ref 250–450)
UIBC: 114 ug/dL

## 2015-02-25 LAB — POCT HEMOGLOBIN-HEMACUE: HEMOGLOBIN: 11.7 g/dL — AB (ref 12.0–15.0)

## 2015-02-25 LAB — FERRITIN: FERRITIN: 740 ng/mL — AB (ref 11–307)

## 2015-02-25 MED ORDER — EPOETIN ALFA 40000 UNIT/ML IJ SOLN
INTRAMUSCULAR | Status: AC
  Administered 2015-02-25: 40000 [IU] via SUBCUTANEOUS
  Filled 2015-02-25: qty 1

## 2015-02-25 MED ORDER — EPOETIN ALFA 10000 UNIT/ML IJ SOLN: 40000.0000 [IU] | INTRAMUSCULAR | Status: DC

## 2015-03-04 ENCOUNTER — Encounter (HOSPITAL_COMMUNITY): Payer: BLUE CROSS/BLUE SHIELD

## 2015-03-17 DIAGNOSIS — Z940 Kidney transplant status: Secondary | ICD-10-CM | POA: Diagnosis not present

## 2015-03-17 DIAGNOSIS — E782 Mixed hyperlipidemia: Secondary | ICD-10-CM | POA: Diagnosis not present

## 2015-03-17 DIAGNOSIS — I129 Hypertensive chronic kidney disease with stage 1 through stage 4 chronic kidney disease, or unspecified chronic kidney disease: Secondary | ICD-10-CM | POA: Diagnosis not present

## 2015-03-17 DIAGNOSIS — M109 Gout, unspecified: Secondary | ICD-10-CM | POA: Diagnosis not present

## 2015-03-17 DIAGNOSIS — N2581 Secondary hyperparathyroidism of renal origin: Secondary | ICD-10-CM | POA: Diagnosis not present

## 2015-03-17 DIAGNOSIS — N2589 Other disorders resulting from impaired renal tubular function: Secondary | ICD-10-CM | POA: Diagnosis not present

## 2015-03-17 DIAGNOSIS — D631 Anemia in chronic kidney disease: Secondary | ICD-10-CM | POA: Diagnosis not present

## 2015-03-17 DIAGNOSIS — N184 Chronic kidney disease, stage 4 (severe): Secondary | ICD-10-CM | POA: Diagnosis not present

## 2015-03-17 DIAGNOSIS — I4891 Unspecified atrial fibrillation: Secondary | ICD-10-CM | POA: Diagnosis not present

## 2015-03-17 DIAGNOSIS — Z7901 Long term (current) use of anticoagulants: Secondary | ICD-10-CM | POA: Diagnosis not present

## 2015-03-17 DIAGNOSIS — Z23 Encounter for immunization: Secondary | ICD-10-CM | POA: Diagnosis not present

## 2015-03-17 DIAGNOSIS — M329 Systemic lupus erythematosus, unspecified: Secondary | ICD-10-CM | POA: Diagnosis not present

## 2015-03-18 ENCOUNTER — Encounter (HOSPITAL_COMMUNITY): Payer: BLUE CROSS/BLUE SHIELD

## 2015-03-23 DIAGNOSIS — M2041 Other hammer toe(s) (acquired), right foot: Secondary | ICD-10-CM | POA: Diagnosis not present

## 2015-04-01 ENCOUNTER — Encounter (HOSPITAL_COMMUNITY)
Admission: RE | Admit: 2015-04-01 | Discharge: 2015-04-01 | Disposition: A | Discharge status: Home / Self Care | Payer: Medicare Other | Source: Ambulatory Visit | Attending: Nephrology | Admitting: Nephrology

## 2015-04-01 DIAGNOSIS — N183 Chronic kidney disease, stage 3 (moderate): Secondary | ICD-10-CM | POA: Diagnosis not present

## 2015-04-01 DIAGNOSIS — D631 Anemia in chronic kidney disease: Secondary | ICD-10-CM | POA: Diagnosis not present

## 2015-04-01 LAB — IRON AND TIBC
IRON: 112 ug/dL (ref 28–170)
Saturation Ratios: 47 % — ABNORMAL HIGH (ref 10.4–31.8)
TIBC: 241 ug/dL — AB (ref 250–450)
UIBC: 129 ug/dL

## 2015-04-01 LAB — RENAL FUNCTION PANEL
ALBUMIN: 3.7 g/dL (ref 3.5–5.0)
ANION GAP: 7 (ref 5–15)
BUN: 50 mg/dL — ABNORMAL HIGH (ref 6–20)
CALCIUM: 9.3 mg/dL (ref 8.9–10.3)
CO2: 24 mmol/L (ref 22–32)
CREATININE: 2.24 mg/dL — AB (ref 0.44–1.00)
Chloride: 108 mmol/L (ref 101–111)
GFR, EST AFRICAN AMERICAN: 24 mL/min — AB (ref >60)
GFR, EST NON AFRICAN AMERICAN: 21 mL/min — AB (ref >60)
Glucose, Bld: 103 mg/dL — ABNORMAL HIGH (ref 65–99)
PHOSPHORUS: 3.7 mg/dL (ref 2.5–4.6)
Potassium: 5.7 mmol/L — ABNORMAL HIGH (ref 3.5–5.1)
Sodium: 139 mmol/L (ref 135–145)

## 2015-04-01 LAB — FERRITIN: Ferritin: 729 ng/mL — ABNORMAL HIGH (ref 11–307)

## 2015-04-01 MED ORDER — EPOETIN ALFA 10000 UNIT/ML IJ SOLN: 40000.0000 [IU] | INTRAMUSCULAR | Status: DC

## 2015-04-01 MED ORDER — EPOETIN ALFA 40000 UNIT/ML IJ SOLN
INTRAMUSCULAR | Status: AC
  Administered 2015-04-01: 40000 [IU]
  Filled 2015-04-01: qty 1

## 2015-04-03 LAB — POCT HEMOGLOBIN-HEMACUE: HEMOGLOBIN: 10 g/dL — AB (ref 12.0–15.0)

## 2015-04-16 ENCOUNTER — Encounter (HOSPITAL_COMMUNITY)
Admission: RE | Admit: 2015-04-16 | Discharge: 2015-04-16 | Disposition: A | Discharge status: Home / Self Care | Payer: Medicare Other | Source: Ambulatory Visit | Attending: Nephrology | Admitting: Nephrology

## 2015-04-16 DIAGNOSIS — N183 Chronic kidney disease, stage 3 (moderate): Secondary | ICD-10-CM | POA: Diagnosis not present

## 2015-04-16 DIAGNOSIS — D631 Anemia in chronic kidney disease: Secondary | ICD-10-CM | POA: Diagnosis not present

## 2015-04-16 LAB — PROTIME-INR
INR: 2.24 — ABNORMAL HIGH (ref 0.00–1.49)
PROTHROMBIN TIME: 24.6 s — AB (ref 11.6–15.2)

## 2015-04-16 LAB — RENAL FUNCTION PANEL
ANION GAP: 10 (ref 5–15)
Albumin: 3.8 g/dL (ref 3.5–5.0)
BUN: 49 mg/dL — AB (ref 6–20)
CHLORIDE: 107 mmol/L (ref 101–111)
CO2: 22 mmol/L (ref 22–32)
Calcium: 9.2 mg/dL (ref 8.9–10.3)
Creatinine, Ser: 2.22 mg/dL — ABNORMAL HIGH (ref 0.44–1.00)
GFR calc Af Amer: 24 mL/min — ABNORMAL LOW (ref >60)
GFR calc non Af Amer: 21 mL/min — ABNORMAL LOW (ref >60)
GLUCOSE: 112 mg/dL — AB (ref 65–99)
POTASSIUM: 4.6 mmol/L (ref 3.5–5.1)
Phosphorus: 3.8 mg/dL (ref 2.5–4.6)
Sodium: 139 mmol/L (ref 135–145)

## 2015-04-16 LAB — POCT HEMOGLOBIN-HEMACUE: HEMOGLOBIN: 11 g/dL — AB (ref 12.0–15.0)

## 2015-04-16 MED ORDER — EPOETIN ALFA 10000 UNIT/ML IJ SOLN
40000.0000 [IU] | INTRAMUSCULAR | Status: DC
Start: 2015-04-16 — End: 2015-04-17

## 2015-04-16 MED ORDER — EPOETIN ALFA 40000 UNIT/ML IJ SOLN
INTRAMUSCULAR | Status: AC
  Administered 2015-04-16: 40000 [IU] via SUBCUTANEOUS
  Filled 2015-04-16: qty 1

## 2015-05-05 ENCOUNTER — Encounter (HOSPITAL_BASED_OUTPATIENT_CLINIC_OR_DEPARTMENT_OTHER): Payer: Self-pay | Admitting: *Deleted

## 2015-05-05 ENCOUNTER — Emergency Department (HOSPITAL_BASED_OUTPATIENT_CLINIC_OR_DEPARTMENT_OTHER)
Admission: EM | Admit: 2015-05-05 | Discharge: 2015-05-05 | Disposition: A | Discharge status: Home / Self Care | Payer: Medicare Other | Attending: Emergency Medicine | Admitting: Emergency Medicine

## 2015-05-05 ENCOUNTER — Emergency Department (HOSPITAL_BASED_OUTPATIENT_CLINIC_OR_DEPARTMENT_OTHER): Payer: Medicare Other

## 2015-05-05 DIAGNOSIS — M25531 Pain in right wrist: Secondary | ICD-10-CM | POA: Diagnosis not present

## 2015-05-05 DIAGNOSIS — I10 Essential (primary) hypertension: Secondary | ICD-10-CM | POA: Insufficient documentation

## 2015-05-05 DIAGNOSIS — M1120 Other chondrocalcinosis, unspecified site: Secondary | ICD-10-CM | POA: Diagnosis not present

## 2015-05-05 DIAGNOSIS — M79641 Pain in right hand: Secondary | ICD-10-CM

## 2015-05-05 DIAGNOSIS — Z85528 Personal history of other malignant neoplasm of kidney: Secondary | ICD-10-CM | POA: Diagnosis not present

## 2015-05-05 DIAGNOSIS — G4733 Obstructive sleep apnea (adult) (pediatric): Secondary | ICD-10-CM | POA: Insufficient documentation

## 2015-05-05 DIAGNOSIS — Z880 Allergy status to penicillin: Secondary | ICD-10-CM | POA: Diagnosis not present

## 2015-05-05 DIAGNOSIS — Z862 Personal history of diseases of the blood and blood-forming organs and certain disorders involving the immune mechanism: Secondary | ICD-10-CM | POA: Insufficient documentation

## 2015-05-05 DIAGNOSIS — D480 Neoplasm of uncertain behavior of bone and articular cartilage: Secondary | ICD-10-CM | POA: Diagnosis not present

## 2015-05-05 DIAGNOSIS — Z86718 Personal history of other venous thrombosis and embolism: Secondary | ICD-10-CM | POA: Diagnosis not present

## 2015-05-05 DIAGNOSIS — Z87448 Personal history of other diseases of urinary system: Secondary | ICD-10-CM | POA: Diagnosis not present

## 2015-05-05 DIAGNOSIS — M11241 Other chondrocalcinosis, right hand: Secondary | ICD-10-CM | POA: Insufficient documentation

## 2015-05-05 DIAGNOSIS — Z8659 Personal history of other mental and behavioral disorders: Secondary | ICD-10-CM | POA: Insufficient documentation

## 2015-05-05 DIAGNOSIS — D481 Neoplasm of uncertain behavior of connective and other soft tissue: Secondary | ICD-10-CM

## 2015-05-05 DIAGNOSIS — D219 Benign neoplasm of connective and other soft tissue, unspecified: Secondary | ICD-10-CM

## 2015-05-05 DIAGNOSIS — E039 Hypothyroidism, unspecified: Secondary | ICD-10-CM | POA: Diagnosis not present

## 2015-05-05 DIAGNOSIS — K219 Gastro-esophageal reflux disease without esophagitis: Secondary | ICD-10-CM | POA: Insufficient documentation

## 2015-05-05 DIAGNOSIS — Z8701 Personal history of pneumonia (recurrent): Secondary | ICD-10-CM | POA: Diagnosis not present

## 2015-05-05 MED ORDER — PREDNISONE 20 MG PO TABS
20.0000 mg | ORAL_TABLET | Freq: Once | ORAL | Status: AC
  Administered 2015-05-05: 20 mg via ORAL
  Filled 2015-05-05: qty 1

## 2015-05-05 MED ORDER — OXYCODONE-ACETAMINOPHEN 5-325 MG PO TABS: 1.0000 | ORAL_TABLET | ORAL | Status: DC | PRN

## 2015-05-05 MED ORDER — OXYCODONE-ACETAMINOPHEN 5-325 MG PO TABS
1.0000 | ORAL_TABLET | Freq: Once | ORAL | Status: AC
  Administered 2015-05-05: 1 via ORAL
  Filled 2015-05-05: qty 1

## 2015-05-05 MED ORDER — PREDNISONE 20 MG PO TABS: 20.0000 mg | ORAL_TABLET | Freq: Every day | ORAL | Status: DC

## 2015-05-05 NOTE — ED Notes (Signed)
C/o soreness to right wrist pain. No injury. States has difficulty opening jars and moving it d/t soreness. Soreness is mostly in wrist.

## 2015-05-05 NOTE — Discharge Instructions (Signed)

## 2015-05-05 NOTE — ED Provider Notes (Signed)
CSN: [id number]     Arrival date & time 05/05/15  0762 History   First MD Initiated Contact with Patient 05/05/15 1109     No chief complaint on file.    (Consider location/radiation/quality/duration/timing/severity/associated sxs/prior Treatment) HPI   74yF with atraumatic right wrist and hand pain. Gradual onset a couple days ago and persistent since then. Mild pain at rest which is notably worse with movement and touch. No numbness or tingling.  Has not tried taking anything for her symptoms. She has a past history of lupus and "arthritis," but says that she has never had symptoms like this in her hand/wrist before.  Past Medical History  Diagnosis Date  . Crohn's disease ([place])   . Hypertension   . Refusal of blood transfusion for reasons of conscience 12/20/2011    pt is Jehovah's Witness  . Atrial fibrillation ([place])   . DVT of leg (deep venous thrombosis) ([place]) 2012-8./13/2013    "have had one in each leg now"  . Numbness and tingling of foot     bilaterally  . GERD (gastroesophageal reflux disease)   . Hypothyroidism   . Lower GI bleed 2012  . DVT of lower extremity, bilateral ([place])     "have had them in both legs; last one was on the RLE this year" (04/25/2012)  . OSA on CPAP   . Anemia     "when lupus flares up"  . SLE (systemic lupus erythematosus) ([place])   . Arthritis     "in my joints"  . Renal disorder     S/P nephrectomy; dialysis; "working fine now" (12/20/11)  . Renal cell carcinoma   . CAP (community acquired pneumonia) 08/20/2014  . Anxiety    Past Surgical History  Procedure Laterality Date  . Cesarean section  1984  . Nephrectomy transplanted organ  2006    bilaterally  . Colectomy  1998    "removal of abscess" (04/25/2012)  . Excisional hemorrhoidectomy    . Cataract extraction w/ intraocular lens  implant, bilateral  2005-2010  . Vena cava filter placement  2012  . Insertion of dialysis catheter  1988-2006    "peritoneal and hemodialysis; had  multiple grafts and fistulas; last graft clotted off 2012"  . Cholecystectomy  1995  . Colostomy  1998  . Colostomy reversal  1999    "6 months after placed" (04/25/2012)  . Bone marrow biopsy  1993; 1995  . Abdominal exploration surgery  1995    "found sluggish bone marrow" (04/25/2012)  . Parathyroid implant removal Left     removed parathyroid from neck and implanted in arm  . Colonoscopy N/A 08/30/2013    Procedure: COLONOSCOPY;  Surgeon: Winfield Cunas., MD;  Location: [facility];  Service: Endoscopy;  Laterality: N/A;   Family History  Problem Relation Age of Onset  . Heart disease Mother   . Hypertension      runs in family  . Sleep apnea Sister    Social History  Substance Use Topics  . Smoking status: Never Smoker   . Smokeless tobacco: Never Used  . Alcohol Use: No   OB History    No data available     Review of Systems  All systems reviewed and negative, other than as noted in HPI.   Allergies  Amoxicillin; Ampicillin; Ciprofloxacin; Erythromycin; Penicillins; Sulfa antibiotics; Sulfamethoxazole-trimethoprim; Tetracycline; and Labetalol  Home Medications   Prior to Admission medications   Medication Sig Start Date End Date Taking? Authorizing Provider  acetaminophen (TYLENOL)  325 MG tablet Take 650 mg by mouth every 6 (six) hours as needed for mild pain.   Yes Historical Provider, MD  allopurinol (ZYLOPRIM) 100 MG tablet Take 100 mg by mouth 2 (two) times daily.  09/22/14  Yes Historical Provider, MD  amiodarone (PACERONE) 100 MG tablet Take 0.5 tablets by mouth 2 (two) times daily. 07/22/14  Yes Historical Provider, MD  calcitRIOL (ROCALTROL) 0.25 MCG capsule Take 0.25 mcg by mouth every other day.    Yes Historical Provider, MD  dextromethorphan-guaiFENesin (MUCINEX DM) 30-600 MG per 12 hr tablet Take 1 tablet by mouth 2 (two) times daily.   Yes Historical Provider, MD  epoetin alfa (EPOGEN,PROCRIT) 16606 UNIT/ML injection Inject 40,000 Units into the  skin every 21 ( twenty-one) days.    Yes Historical Provider, MD  feeding supplement, ENSURE ENLIVE, (ENSURE ENLIVE) LIQD Take 237 mLs by mouth 2 (two) times daily between meals. 08/26/14  Yes Tasrif Ahmed, MD  gabapentin (NEURONTIN) 100 MG capsule Take 200 mg by mouth at bedtime.    Yes Historical Provider, MD  levothyroxine (SYNTHROID, LEVOTHROID) 100 MCG tablet Take 100 mcg by mouth daily.   Yes Historical Provider, MD  MAGNESIUM-OXIDE 400 (241.3 MG) MG tablet Take 400 mg by mouth 2 (two) times daily. 05/19/14  Yes Historical Provider, MD  metoprolol tartrate (LOPRESSOR) 25 MG tablet Take 1 tablet by mouth 2 (two) times daily. 09/18/14  Yes Historical Provider, MD  mycophenolate (MYFORTIC) 180 MG EC tablet Take 360 mg by mouth 2 (two) times daily.     Yes Historical Provider, MD  omeprazole (PRILOSEC) 20 MG capsule Take 20 mg by mouth daily.    Yes Historical Provider, MD  polyethylene glycol (MIRALAX / GLYCOLAX) packet Take 17 g by mouth 2 (two) times daily as needed. For constipation   Yes Historical Provider, MD  polyvinyl alcohol-povidone (HYPOTEARS) 1.4-0.6 % ophthalmic solution Place 1-2 drops into both eyes 4 (four) times daily as needed (dry eyes).   Yes Historical Provider, MD  predniSONE (DELTASONE) 5 MG tablet Take 5 mg by mouth daily.    Yes Historical Provider, MD  tacrolimus (PROGRAF) 1 MG capsule Take 4 mg by mouth every evening.    Yes Historical Provider, MD  tacrolimus (PROGRAF) 5 MG capsule Take 5 mg by mouth every morning.   Yes Historical Provider, MD  warfarin (COUMADIN) 2.5 MG tablet Take 1.25-2.5 mg by mouth See admin instructions. Take 2.46m tablet every day except on Thursdays take 1/2 of the 2.555m  Yes Historical Provider, MD   LMP 03/05/2014 Physical Exam  Constitutional: She appears well-developed and well-nourished. No distress.  HENT:  Head: Normocephalic and atraumatic.  Eyes: Conjunctivae are normal. Right eye exhibits no discharge. Left eye exhibits no  discharge.  Neck: Neck supple.  Cardiovascular: Normal rate, regular rhythm and normal heart sounds.  Exam reveals no gallop and no friction rub.   No murmur heard. Pulmonary/Chest: Effort normal and breath sounds normal. No respiratory distress.  Abdominal: Soft. She exhibits no distension. There is no tenderness.  Musculoskeletal: She exhibits edema and tenderness.  Diffuse, mild swelling to R hand/wrist. No rash. NVI. Mild tenderness to palpation. Pain is increased range of motion particularly at the wrist. Patient can actively range her wrist though.  Neurological: She is alert.  Skin: Skin is warm and dry.  Psychiatric: She has a normal mood and affect. Her behavior is normal. Thought content normal.  Nursing note and vitals reviewed.   ED Course  Procedures (including  critical care time) Labs Review Labs Reviewed - No data to display  Imaging Review Dg Wrist Complete Right  05/05/2015  CLINICAL DATA:  74 year old female with right wrist pain, tenderness and decreased range of motion. No known injury. EXAM: RIGHT WRIST - COMPLETE 3+ VIEW COMPARISON:  None. FINDINGS: No evidence of acute fracture or malalignment. Chondrocalcinosis is present in the radial carpal joint space and in the region of the triangular fibrocartilage. Atherosclerotic calcifications present throughout the radial and ulnar arteries. Normal bony mineralization. No lytic or blastic osseous lesion. On the lateral view, there is more prominent ossification in the anterior carpal joint space concerning for synovial osteochondromatosis. No significant degenerative osteoarthritis or evidence of inflammatory arthritis. IMPRESSION: 1. Moderate to advanced chondrocalcinosis in the radiocarpal and intercarpal joints as well as at the triangular fibrocartilage. 2. 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 3. Atherosclerotic vascular calcifications. Electronically Signed   By: Jacqulynn Cadet M.D.   On: 05/05/2015 10:48   I have personally reviewed and evaluated these images and lab results as part of my medical decision-making.   EKG Interpretation None      MDM   Final diagnoses:  Right hand pain  Chondrocalcinosis  Osteochondromatosis, synovial    Imaging as above. Hx of renal transplant. NSAIDs avoided for this reason. Chronic low dose steroid use. Will increase temporarily. PRN percocet. Hand follow-up.     Virgel Manifold, MD 05/15/15 1034

## 2015-05-06 ENCOUNTER — Ambulatory Visit: Payer: BLUE CROSS/BLUE SHIELD | Admitting: Medical

## 2015-05-06 ENCOUNTER — Encounter: Payer: Self-pay | Admitting: *Deleted

## 2015-05-06 DIAGNOSIS — M25531 Pain in right wrist: Secondary | ICD-10-CM | POA: Diagnosis not present

## 2015-05-07 ENCOUNTER — Encounter (HOSPITAL_COMMUNITY)
Admission: RE | Admit: 2015-05-07 | Discharge: 2015-05-07 | Disposition: A | Discharge status: Home / Self Care | Payer: Medicare Other | Source: Ambulatory Visit | Attending: Nephrology | Admitting: Nephrology

## 2015-05-07 DIAGNOSIS — D631 Anemia in chronic kidney disease: Secondary | ICD-10-CM | POA: Diagnosis not present

## 2015-05-07 DIAGNOSIS — N183 Chronic kidney disease, stage 3 (moderate): Secondary | ICD-10-CM | POA: Diagnosis not present

## 2015-05-07 LAB — IRON AND TIBC
Iron: 127 ug/dL (ref 28–170)
SATURATION RATIOS: 59 % — AB (ref 10.4–31.8)
TIBC: 214 ug/dL — AB (ref 250–450)
UIBC: 87 ug/dL

## 2015-05-07 LAB — RENAL FUNCTION PANEL
ANION GAP: 9 (ref 5–15)
Albumin: 3.6 g/dL (ref 3.5–5.0)
BUN: 41 mg/dL — ABNORMAL HIGH (ref 6–20)
CALCIUM: 9.2 mg/dL (ref 8.9–10.3)
CHLORIDE: 106 mmol/L (ref 101–111)
CO2: 25 mmol/L (ref 22–32)
Creatinine, Ser: 2.09 mg/dL — ABNORMAL HIGH (ref 0.44–1.00)
GFR calc non Af Amer: 23 mL/min — ABNORMAL LOW (ref >60)
GFR, EST AFRICAN AMERICAN: 26 mL/min — AB (ref >60)
GLUCOSE: 122 mg/dL — AB (ref 65–99)
POTASSIUM: 4.7 mmol/L (ref 3.5–5.1)
Phosphorus: 3.8 mg/dL (ref 2.5–4.6)
Sodium: 140 mmol/L (ref 135–145)

## 2015-05-07 LAB — POCT HEMOGLOBIN-HEMACUE: HEMOGLOBIN: 11.3 g/dL — AB (ref 12.0–15.0)

## 2015-05-07 LAB — FERRITIN: FERRITIN: 636 ng/mL — AB (ref 11–307)

## 2015-05-07 MED ORDER — EPOETIN ALFA 10000 UNIT/ML IJ SOLN: 40000.0000 [IU] | INTRAMUSCULAR | Status: DC

## 2015-05-07 MED ORDER — EPOETIN ALFA 40000 UNIT/ML IJ SOLN
INTRAMUSCULAR | Status: AC
  Administered 2015-05-07: 40000 [IU] via SUBCUTANEOUS
  Filled 2015-05-07: qty 1

## 2015-05-28 ENCOUNTER — Encounter (HOSPITAL_COMMUNITY)
Admission: RE | Admit: 2015-05-28 | Discharge: 2015-05-28 | Disposition: A | Discharge status: Home / Self Care | Payer: Medicare Other | Source: Ambulatory Visit | Attending: Nephrology | Admitting: Nephrology

## 2015-05-28 DIAGNOSIS — N183 Chronic kidney disease, stage 3 (moderate): Secondary | ICD-10-CM | POA: Insufficient documentation

## 2015-05-28 DIAGNOSIS — D631 Anemia in chronic kidney disease: Secondary | ICD-10-CM | POA: Insufficient documentation

## 2015-05-28 LAB — POCT HEMOGLOBIN-HEMACUE: HEMOGLOBIN: 11.3 g/dL — AB (ref 12.0–15.0)

## 2015-05-28 MED ORDER — EPOETIN ALFA 10000 UNIT/ML IJ SOLN: 40000.0000 [IU] | INTRAMUSCULAR | Status: DC

## 2015-05-28 MED ORDER — EPOETIN ALFA 40000 UNIT/ML IJ SOLN
INTRAMUSCULAR | Status: AC
Start: 2015-05-28 — End: 2015-05-28
  Administered 2015-05-28: 40000 [IU] via SUBCUTANEOUS
  Filled 2015-05-28: qty 1

## 2015-06-12 DIAGNOSIS — E039 Hypothyroidism, unspecified: Secondary | ICD-10-CM | POA: Diagnosis not present

## 2015-06-12 DIAGNOSIS — I129 Hypertensive chronic kidney disease with stage 1 through stage 4 chronic kidney disease, or unspecified chronic kidney disease: Secondary | ICD-10-CM | POA: Diagnosis not present

## 2015-06-12 DIAGNOSIS — Z8719 Personal history of other diseases of the digestive system: Secondary | ICD-10-CM | POA: Diagnosis not present

## 2015-06-12 DIAGNOSIS — M329 Systemic lupus erythematosus, unspecified: Secondary | ICD-10-CM | POA: Diagnosis not present

## 2015-06-12 DIAGNOSIS — M109 Gout, unspecified: Secondary | ICD-10-CM | POA: Diagnosis not present

## 2015-06-12 DIAGNOSIS — I4891 Unspecified atrial fibrillation: Secondary | ICD-10-CM | POA: Diagnosis not present

## 2015-06-12 DIAGNOSIS — E782 Mixed hyperlipidemia: Secondary | ICD-10-CM | POA: Diagnosis not present

## 2015-06-12 DIAGNOSIS — Z7901 Long term (current) use of anticoagulants: Secondary | ICD-10-CM | POA: Diagnosis not present

## 2015-06-12 DIAGNOSIS — D631 Anemia in chronic kidney disease: Secondary | ICD-10-CM | POA: Diagnosis not present

## 2015-06-12 DIAGNOSIS — N2581 Secondary hyperparathyroidism of renal origin: Secondary | ICD-10-CM | POA: Diagnosis not present

## 2015-06-12 DIAGNOSIS — N184 Chronic kidney disease, stage 4 (severe): Secondary | ICD-10-CM | POA: Diagnosis not present

## 2015-06-12 DIAGNOSIS — Z940 Kidney transplant status: Secondary | ICD-10-CM | POA: Diagnosis not present

## 2015-06-12 DIAGNOSIS — N2589 Other disorders resulting from impaired renal tubular function: Secondary | ICD-10-CM | POA: Diagnosis not present

## 2015-06-13 LAB — PROTIME-INR: INR: 1.1 (ref 0.9–1.1)

## 2015-06-17 DIAGNOSIS — N390 Urinary tract infection, site not specified: Secondary | ICD-10-CM | POA: Diagnosis not present

## 2015-06-18 ENCOUNTER — Encounter (HOSPITAL_COMMUNITY): Payer: BLUE CROSS/BLUE SHIELD

## 2015-06-25 ENCOUNTER — Encounter: Payer: Self-pay | Admitting: *Deleted

## 2015-06-25 ENCOUNTER — Telehealth: Payer: Self-pay | Admitting: *Deleted

## 2015-06-25 DIAGNOSIS — Z7901 Long term (current) use of anticoagulants: Secondary | ICD-10-CM | POA: Diagnosis not present

## 2015-06-25 LAB — PROTIME-INR: INR: 1.8 — AB (ref 0.9–1.1)

## 2015-06-25 NOTE — Telephone Encounter (Signed)
Pre-Visit Call completed with patient and chart updated.   Pre-Visit Info documented in Specialty Comments under [employer].    

## 2015-06-26 ENCOUNTER — Ambulatory Visit (INDEPENDENT_AMBULATORY_CARE_PROVIDER_SITE_OTHER): Payer: Medicare Other | Admitting: Family Medicine

## 2015-06-26 ENCOUNTER — Encounter: Payer: Self-pay | Admitting: Family Medicine

## 2015-06-26 VITALS — BP 140/68 | HR 70 | Temp 98.1°F | Ht 63.0 in | Wt 137.2 lb

## 2015-06-26 DIAGNOSIS — I10 Essential (primary) hypertension: Secondary | ICD-10-CM | POA: Diagnosis not present

## 2015-06-26 DIAGNOSIS — E2839 Other primary ovarian failure: Secondary | ICD-10-CM

## 2015-06-26 DIAGNOSIS — I482 Chronic atrial fibrillation, unspecified: Secondary | ICD-10-CM

## 2015-06-26 DIAGNOSIS — Z1231 Encounter for screening mammogram for malignant neoplasm of breast: Secondary | ICD-10-CM

## 2015-06-26 DIAGNOSIS — N289 Disorder of kidney and ureter, unspecified: Secondary | ICD-10-CM | POA: Diagnosis not present

## 2015-06-26 NOTE — Patient Instructions (Addendum)
Please call the [facility] to schedule your Mammogram and Bone Density Scan Address: [address], [place], [place] [address]  Phone: [phone]  Pneumococcal Vaccine, Polyvalent suspension for injection What is this medicine? PNEUMOCOCCAL VACCINE (NEU mo KOK al vak SEEN) is a vaccine used to prevent pneumococcus bacterial infections. These bacteria can cause serious infections like pneumonia, meningitis, and blood infections. This vaccine will lower your chance of getting pneumonia. If you do get pneumonia, it can make your symptoms milder and your illness shorter. This vaccine will not treat an infection and will not cause infection. This vaccine is recommended for infants and young children, adults with certain medical conditions, and adults 33 years or older. This medicine may be used for other purposes; ask your health care provider or pharmacist if you have questions. What should I tell my health care provider before I take this medicine? They need to know if you have any of these conditions: -bleeding problems -fever -immune system problems -an unusual or allergic reaction to pneumococcal vaccine, diphtheria toxoid, other vaccines, latex, other medicines, foods, dyes, or preservatives -pregnant or trying to get pregnant -breast-feeding How should I use this medicine? This vaccine is for injection into a muscle. It is given by a health care professional. A copy of Vaccine Information Statements will be given before each vaccination. Read this sheet carefully each time. The sheet may change frequently. Talk to your pediatrician regarding the use of this medicine in children. While this drug may be prescribed for children as young as 77 weeks old for selected conditions, precautions do apply. Overdosage: If you think you have taken too much of this medicine contact a poison control center or emergency room at once. NOTE: This medicine is only for you. Do not share this  medicine with others. What if I miss a dose? It is important not to miss your dose. Call your doctor or health care professional if you are unable to keep an appointment. What may interact with this medicine? -medicines for cancer chemotherapy -medicines that suppress your immune function -steroid medicines like prednisone or cortisone This list may not describe all possible interactions. Give your health care provider a list of all the medicines, herbs, non-prescription drugs, or dietary supplements you use. Also tell them if you smoke, drink alcohol, or use illegal drugs. Some items may interact with your medicine. What should I watch for while using this medicine? Mild fever and pain should go away in 3 days or less. Report any unusual symptoms to your doctor or health care professional. What side effects may I notice from receiving this medicine? Side effects that you should report to your doctor or health care professional as soon as possible: -allergic reactions like skin rash, itching or hives, swelling of the face, lips, or tongue -breathing problems -confused -fast or irregular heartbeat -fever over 102 degrees F -seizures -unusual bleeding or bruising -unusual muscle weakness Side effects that usually do not require medical attention (report to your doctor or health care professional if they continue or are bothersome): -aches and pains -diarrhea -fever of 102 degrees F or less -headache -irritable -loss of appetite -pain, tender at site where injected -trouble sleeping This list may not describe all possible side effects. Call your doctor for medical advice about side effects. You may report side effects to FDA at 1-800-FDA-1088. Where should I keep my medicine? This does not apply. This vaccine is given in a clinic, pharmacy, doctor's office, or other health  care setting and will not be stored at home. NOTE: This sheet is a summary. It may not cover all possible  information. If you have questions about this medicine, talk to your doctor, pharmacist, or health care provider.    2016, Elsevier/Gold Standard. (2014-01-30 10:27:27)

## 2015-06-26 NOTE — Progress Notes (Signed)
Patient presents today for follow up of multiple medical problems.    Lab Results  Component Value Date   HGBA1C 6.5* 11/12/2014   HGBA1C  04/23/2008    5.7 (NOTE)   The ADA recommends the following therapeutic goal for glycemic   control related to Hgb A1C measurement:   Goal of Therapy:   < 7.0% Hgb A1C   Reference: American Diabetes Association: Clinical Practice   Recommendations 2008, Diabetes Care,  2008, 31:(Suppl 1).    Lab Results  Component Value Date   [facility]  07/19/2008    72        Total Cholesterol/HDL:CHD Risk Coronary Heart Disease Risk Table                     Men   Women  1/2 Average Risk   3.4   3.3  Average Risk       5.0   4.4  2 X Average Risk   9.6   7.1  3 X Average Risk  23.4   11.0        Use the calculated Patient Ratio above and the CHD Risk Table to determine the patient's CHD Risk.        ATP III CLASSIFICATION (LDL):  <100     mg/dL   Optimal  100-129  mg/dL   Near or Above                    Optimal  130-159  mg/dL   Borderline  160-189  mg/dL   High  >190     mg/dL   Very High   CREATININE 2.09* 05/07/2015    Hyperlipidemia  Patient is currently maintained on the following medication for hyperlipidemia: none Last lipid panel as follows:  Lab Results  Component Value Date   CHOL  07/19/2008    124        ATP III CLASSIFICATION:  <200     mg/dL   Desirable  200-239  mg/dL   Borderline High  >=240    mg/dL   High          HDL 34* 07/19/2008   LDLCALC  07/19/2008    72        Total Cholesterol/HDL:CHD Risk Coronary Heart Disease Risk Table                     Men   Women  1/2 Average Risk   3.4   3.3  Average Risk       5.0   4.4  2 X Average Risk   9.6   7.1  3 X Average Risk  23.4   11.0        Use the calculated Patient Ratio above and the CHD Risk Table to determine the patient's CHD Risk.        ATP III CLASSIFICATION (LDL):  <100     mg/dL   Optimal  100-129  mg/dL   Near or Above                    Optimal  130-159  mg/dL   Borderline  160-189  mg/dL   High  >190     mg/dL   Very High   TRIG 89 07/19/2008   CHOLHDL 3.6 07/19/2008   Patient denies myalgia. Patient reports good compliance with low fat/low cholesterol diet.   Hypertension  Patient is currently maintained on the following medications  for blood pressure: metoprolol Patient reports good compliance with blood pressure medications. Patient denies chest pain, shortness of breath or swelling. Last 3 blood pressure readings in our office are as follows: BP Readings from Last 3 Encounters:  06/26/15 140/68  05/28/15 141/58  05/07/15 137/57      Patient ID: [name], female    DOB: [birth year]  Age: 75 y.o. MRN: [id number]    Subjective:  Subjective HPI [name] presents to establish.  No complaints.    Review of Systems  Constitutional: Negative for fever, diaphoresis, appetite change, fatigue and unexpected weight change.  Eyes: Negative for pain, redness and visual disturbance.  Respiratory: Negative for cough, chest tightness, shortness of breath and wheezing.   Cardiovascular: Negative for chest pain, palpitations and leg swelling.  Endocrine: Negative for cold intolerance, heat intolerance, polydipsia, polyphagia and polyuria.  Genitourinary: Negative for dysuria, frequency and difficulty urinating.  Allergic/Immunologic: Negative for environmental allergies.  Neurological: Negative for dizziness, light-headedness, numbness and headaches.    History Past Medical History  Diagnosis Date  . Crohn's disease ([place])   . Hypertension   . Refusal of blood transfusion for reasons of conscience 12/20/2011    pt is Jehovah's Witness  . Atrial fibrillation ([place])   . DVT of leg (deep venous thrombosis) ([place]) 2012-8./13/2013    "have had one in each leg now"  . Numbness and tingling of foot     bilaterally  . GERD (gastroesophageal reflux disease)   . Hypothyroidism   . Lower GI bleed 2012  . DVT of lower  extremity, bilateral ([place])     "have had them in both legs; last one was on the RLE this year" (04/25/2012)  . OSA on CPAP   . Anemia     "when lupus flares up"  . SLE (systemic lupus erythematosus) ([place])   . Arthritis     "in my joints"  . Renal disorder     S/P nephrectomy; dialysis; "working fine now" (12/20/11)  . Renal cell carcinoma   . CAP (community acquired pneumonia) 08/20/2014  . Anxiety   . Gout 2016    She has past surgical history that includes Cesarean section (1984); Nephrectomy transplanted organ (2006); Colectomy (1998); Excisional hemorrhoidectomy; Cataract extraction w/ intraocular lens  implant, bilateral (2005-2010); Vena cava filter placement (2012); Insertion of dialysis catheter (8676-1950); Cholecystectomy (1995); Colostomy (1998); Colostomy reversal (1999); Bone marrow biopsy (1993; 1995); Abdominal exploration surgery (1995); Parathyroid implant removal (Left); and Colonoscopy (N/A, 08/30/2013).   Her family history includes Heart disease in her mother; Sleep apnea in her sister.She reports that she has never smoked. She has never used smokeless tobacco. She reports that she does not drink alcohol or use illicit drugs.  Current Outpatient Prescriptions on File Prior to Visit  Medication Sig Dispense Refill  . acetaminophen (TYLENOL) 325 MG tablet Take 650 mg by mouth every 6 (six) hours as needed for mild pain.    [place] Kitchen allopurinol (ZYLOPRIM) 100 MG tablet Take 100 mg by mouth 2 (two) times daily.     [place] Kitchen amiodarone (PACERONE) 100 MG tablet Take 0.5 tablets by mouth 2 (two) times daily.  0  . calcitRIOL (ROCALTROL) 0.25 MCG capsule Take 0.25 mcg by mouth daily.     [place] Kitchen dextromethorphan-guaiFENesin (MUCINEX DM) 30-600 MG per 12 hr tablet Take 1 tablet by mouth 2 (two) times daily as needed. Reported on 06/25/2015    . epoetin alfa (EPOGEN,PROCRIT) 93267 UNIT/ML injection Inject 40,000 Units into the skin every  21 ( twenty-one) days.     . feeding supplement, ENSURE ENLIVE,  (ENSURE ENLIVE) LIQD Take 237 mLs by mouth 2 (two) times daily between meals. (Patient taking differently: Take 237 mLs by mouth 2 (two) times daily as needed. ) 60 Bottle 12  . gabapentin (NEURONTIN) 100 MG capsule Take 200 mg by mouth at bedtime.     Marland Kitchen levothyroxine (SYNTHROID, LEVOTHROID) 100 MCG tablet Take 100 mcg by mouth daily.    Mar[place] Kitchen MAGNESIUM-OXIDE 400 (241.3 MG) MG tablet Take 400 mg by mouth 2 (two) times daily.  0  . metoprolol tartrate (LOPRESSOR) 25 MG tablet Take 1 tablet by mouth 2 (two) times daily.    . mycophenolate (MYFORTIC) 180 MG EC tablet Take 360 mg by mouth 2 (two) times daily.      Mar[place] Kitchen omeprazole (PRILOSEC) 20 MG capsule Take 20 mg by mouth daily.     . polyethylene glycol (MIRALAX / GLYCOLAX) packet Take 17 g by mouth 2 (two) times daily as needed. For constipation    . polyvinyl alcohol-povidone (HYPOTEARS) 1.4-0.6 % ophthalmic solution Place 1-2 drops into both eyes 4 (four) times daily as needed (dry eyes).    . predniSONE (DELTASONE) 5 MG tablet Take 5 mg by mouth daily.     . tacrolimus (PROGRAF) 1 MG capsule Take 4 mg by mouth 2 (two) times daily.     [place] Kitchen warfarin (COUMADIN) 2.5 MG tablet Take 2.5 mg by mouth daily.      No current facility-administered medications on file prior to visit.     Objective:  Objective Physical Exam  Constitutional: She is oriented to person, place, and time. She appears well-developed and well-nourished.  HENT:  Head: Normocephalic and atraumatic.  Eyes: Conjunctivae and EOM are normal.  Neck: Normal range of motion. Neck supple. No JVD present. Carotid bruit is not present. No thyromegaly present.  Cardiovascular: Normal rate, regular rhythm and normal heart sounds.   No murmur heard. Pulmonary/Chest: Effort normal and breath sounds normal. No respiratory distress. She has no wheezes. She has no rales. She exhibits no tenderness.  Musculoskeletal: She exhibits no edema.  Neurological: She is alert and oriented to person, place,  and time.  Psychiatric: She has a normal mood and affect. Her behavior is normal. Judgment and thought content normal.  Nursing note and vitals reviewed.  BP 140/68 mmHg  Pulse 70  Temp(Src) 98.1 F (36.7 C) (Oral)  Ht 5' 3"  (1.6 m)  Wt 137 lb 3.2 oz (62.234 kg)  BMI 24.31 kg/m2  SpO2 95%  LMP 03/05/2014 Wt Readings from Last 3 Encounters:  06/26/15 137 lb 3.2 oz (62.234 kg)  12/11/14 134 lb (60.782 kg)  11/12/14 133 lb 14.4 oz (60.737 kg)     Lab Results  Component Value Date   WBC 11.1* 11/13/2014   HGB 11.3* 05/28/2015   HCT 33.1* 11/13/2014   PLT 156 11/13/2014   GLUCOSE 122* 05/07/2015   CHOL  07/19/2008    124        ATP III CLASSIFICATION:  <200     mg/dL   Desirable  200-239  mg/dL   Borderline High  >=240    mg/dL   High          TRIG 89 07/19/2008   HDL 34* 07/19/2008   LDLCALC  07/19/2008    72        Total Cholesterol/HDL:CHD Risk Coronary Heart Disease Risk Table  Men   Women  1/2 Average Risk   3.4   3.3  Average Risk       5.0   4.4  2 X Average Risk   9.6   7.1  3 X Average Risk  23.4   11.0        Use the calculated Patient Ratio above and the CHD Risk Table to determine the patient's CHD Risk.        ATP III CLASSIFICATION (LDL):  <100     mg/dL   Optimal  100-129  mg/dL   Near or Above                    Optimal  130-159  mg/dL   Borderline  160-189  mg/dL   High  >190     mg/dL   Very High   ALT 12* 11/12/2014   AST 16 11/12/2014   NA 140 05/07/2015   K 4.7 05/07/2015   CL 106 05/07/2015   CREATININE 2.09* 05/07/2015   BUN 41* 05/07/2015   CO2 25 05/07/2015   TSH 0.706 11/12/2014   INR 2.24* 04/16/2015   HGBA1C 6.5* 11/12/2014    No results found.   Assessment & Plan:  Plan I am having [name] maintain her calcitRIOL, gabapentin, polyethylene glycol, mycophenolate, predniSONE, omeprazole, levothyroxine, epoetin alfa, tacrolimus, polyvinyl alcohol-povidone, acetaminophen, dextromethorphan-guaiFENesin,  MAGNESIUM-OXIDE, feeding supplement (ENSURE ENLIVE), allopurinol, metoprolol tartrate, amiodarone, and warfarin.  No orders of the defined types were placed in this encounter.    Problem List Items Addressed This Visit      Unprioritized   Renal disorder    Per nephrology      HTN (hypertension) (Chronic)    con't metoprolol stable      Atrial fibrillation (HCC) (Chronic)    Per cardiology  on coumadin       Other Visit Diagnoses    Estrogen deficiency    -  Primary    Relevant Orders    DG Bone Density    Visit for screening mammogram        Relevant Orders    MM DIGITAL SCREENING BILATERAL       Follow-up: Return in about 6 months (around 12/24/2015), or if symptoms worsen or fail to improve.  Garnet Koyanagi, DO

## 2015-06-26 NOTE — Progress Notes (Signed)
Pre visit review using our clinic review tool, if applicable. No additional management support is needed unless otherwise documented below in the visit note. 

## 2015-06-28 ENCOUNTER — Encounter: Payer: Self-pay | Admitting: Family Medicine

## 2015-06-28 NOTE — Assessment & Plan Note (Signed)
con't metoprolol stable

## 2015-06-28 NOTE — Assessment & Plan Note (Signed)
Per nephrology 

## 2015-06-28 NOTE — Assessment & Plan Note (Signed)
Per cardiology  on coumadin

## 2015-07-08 ENCOUNTER — Other Ambulatory Visit (HOSPITAL_COMMUNITY): Payer: Self-pay | Admitting: *Deleted

## 2015-07-09 ENCOUNTER — Encounter (HOSPITAL_COMMUNITY)
Admission: RE | Admit: 2015-07-09 | Discharge: 2015-07-09 | Disposition: A | Discharge status: Home / Self Care | Payer: Medicare Other | Source: Ambulatory Visit | Attending: Nephrology | Admitting: Nephrology

## 2015-07-09 DIAGNOSIS — N183 Chronic kidney disease, stage 3 (moderate): Secondary | ICD-10-CM | POA: Diagnosis not present

## 2015-07-09 DIAGNOSIS — D631 Anemia in chronic kidney disease: Secondary | ICD-10-CM | POA: Diagnosis not present

## 2015-07-09 LAB — RENAL FUNCTION PANEL
Albumin: 3.8 g/dL (ref 3.5–5.0)
Anion gap: 13 (ref 5–15)
BUN: 44 mg/dL — AB (ref 6–20)
CHLORIDE: 109 mmol/L (ref 101–111)
CO2: 20 mmol/L — ABNORMAL LOW (ref 22–32)
Calcium: 9.6 mg/dL (ref 8.9–10.3)
Creatinine, Ser: 2.16 mg/dL — ABNORMAL HIGH (ref 0.44–1.00)
GFR calc Af Amer: 25 mL/min — ABNORMAL LOW (ref >60)
GFR, EST NON AFRICAN AMERICAN: 22 mL/min — AB (ref >60)
Glucose, Bld: 81 mg/dL (ref 65–99)
POTASSIUM: 4.6 mmol/L (ref 3.5–5.1)
Phosphorus: 3.3 mg/dL (ref 2.5–4.6)
Sodium: 142 mmol/L (ref 135–145)

## 2015-07-09 LAB — IRON AND TIBC
Iron: 73 ug/dL (ref 28–170)
SATURATION RATIOS: 32 % — AB (ref 10.4–31.8)
TIBC: 231 ug/dL — ABNORMAL LOW (ref 250–450)
UIBC: 158 ug/dL

## 2015-07-09 LAB — FERRITIN: FERRITIN: 834 ng/mL — AB (ref 11–307)

## 2015-07-09 LAB — POCT HEMOGLOBIN-HEMACUE: HEMOGLOBIN: 10.8 g/dL — AB (ref 12.0–15.0)

## 2015-07-09 MED ORDER — EPOETIN ALFA 10000 UNIT/ML IJ SOLN: 40000.0000 [IU] | INTRAMUSCULAR | Status: DC

## 2015-07-09 MED ORDER — EPOETIN ALFA 40000 UNIT/ML IJ SOLN
INTRAMUSCULAR | Status: AC
  Administered 2015-07-09: 40000 [IU] via SUBCUTANEOUS
  Filled 2015-07-09: qty 1

## 2015-07-16 DIAGNOSIS — Z940 Kidney transplant status: Secondary | ICD-10-CM | POA: Diagnosis not present

## 2015-07-16 DIAGNOSIS — Z7901 Long term (current) use of anticoagulants: Secondary | ICD-10-CM | POA: Diagnosis not present

## 2015-07-16 DIAGNOSIS — N2581 Secondary hyperparathyroidism of renal origin: Secondary | ICD-10-CM | POA: Diagnosis not present

## 2015-07-30 ENCOUNTER — Encounter (HOSPITAL_COMMUNITY)
Admission: RE | Admit: 2015-07-30 | Discharge: 2015-07-30 | Disposition: A | Discharge status: Home / Self Care | Payer: Medicare Other | Source: Ambulatory Visit | Attending: Nephrology | Admitting: Nephrology

## 2015-07-30 DIAGNOSIS — D631 Anemia in chronic kidney disease: Secondary | ICD-10-CM | POA: Diagnosis not present

## 2015-07-30 DIAGNOSIS — N183 Chronic kidney disease, stage 3 (moderate): Secondary | ICD-10-CM | POA: Diagnosis not present

## 2015-07-30 LAB — POCT HEMOGLOBIN-HEMACUE: HEMOGLOBIN: 11 g/dL — AB (ref 12.0–15.0)

## 2015-07-30 LAB — PROTIME-INR
INR: 2.44 — ABNORMAL HIGH (ref 0.00–1.49)
Prothrombin Time: 26.2 seconds — ABNORMAL HIGH (ref 11.6–15.2)

## 2015-07-30 MED ORDER — EPOETIN ALFA 40000 UNIT/ML IJ SOLN
INTRAMUSCULAR | Status: AC
  Administered 2015-07-30: 40000 [IU]
  Filled 2015-07-30: qty 1

## 2015-07-30 MED ORDER — EPOETIN ALFA 10000 UNIT/ML IJ SOLN: 40000.0000 [IU] | INTRAMUSCULAR | Status: DC

## 2015-08-20 ENCOUNTER — Encounter (HOSPITAL_COMMUNITY)
Admission: RE | Admit: 2015-08-20 | Discharge: 2015-08-20 | Disposition: A | Discharge status: Home / Self Care | Payer: Medicare Other | Source: Ambulatory Visit | Attending: Nephrology | Admitting: Nephrology

## 2015-08-20 DIAGNOSIS — N183 Chronic kidney disease, stage 3 (moderate): Secondary | ICD-10-CM | POA: Diagnosis not present

## 2015-08-20 DIAGNOSIS — D631 Anemia in chronic kidney disease: Secondary | ICD-10-CM | POA: Insufficient documentation

## 2015-08-20 LAB — RENAL FUNCTION PANEL
Albumin: 3.7 g/dL (ref 3.5–5.0)
Anion gap: 11 (ref 5–15)
BUN: 44 mg/dL — AB (ref 6–20)
CHLORIDE: 107 mmol/L (ref 101–111)
CO2: 22 mmol/L (ref 22–32)
CREATININE: 2.46 mg/dL — AB (ref 0.44–1.00)
Calcium: 9.3 mg/dL (ref 8.9–10.3)
GFR calc Af Amer: 21 mL/min — ABNORMAL LOW (ref >60)
GFR, EST NON AFRICAN AMERICAN: 18 mL/min — AB (ref >60)
Glucose, Bld: 108 mg/dL — ABNORMAL HIGH (ref 65–99)
Phosphorus: 4 mg/dL (ref 2.5–4.6)
Potassium: 4.8 mmol/L (ref 3.5–5.1)
SODIUM: 140 mmol/L (ref 135–145)

## 2015-08-20 LAB — IRON AND TIBC
IRON: 115 ug/dL (ref 28–170)
Saturation Ratios: 50 % — ABNORMAL HIGH (ref 10.4–31.8)
TIBC: 231 ug/dL — ABNORMAL LOW (ref 250–450)
UIBC: 116 ug/dL

## 2015-08-20 LAB — FERRITIN: FERRITIN: 599 ng/mL — AB (ref 11–307)

## 2015-08-20 MED ORDER — EPOETIN ALFA 10000 UNIT/ML IJ SOLN: 40000.0000 [IU] | INTRAMUSCULAR | Status: DC

## 2015-08-20 MED ORDER — EPOETIN ALFA 40000 UNIT/ML IJ SOLN
INTRAMUSCULAR | Status: AC
  Administered 2015-08-20: 40000 [IU] via SUBCUTANEOUS
  Filled 2015-08-20: qty 1

## 2015-08-21 ENCOUNTER — Ambulatory Visit
Admission: RE | Admit: 2015-08-21 | Discharge: 2015-08-21 | Disposition: A | Discharge status: Home / Self Care | Payer: Medicare Other | Source: Ambulatory Visit | Attending: Family Medicine | Admitting: Family Medicine

## 2015-08-21 DIAGNOSIS — Z1231 Encounter for screening mammogram for malignant neoplasm of breast: Secondary | ICD-10-CM

## 2015-08-21 DIAGNOSIS — E2839 Other primary ovarian failure: Secondary | ICD-10-CM

## 2015-08-21 DIAGNOSIS — M810 Age-related osteoporosis without current pathological fracture: Secondary | ICD-10-CM | POA: Diagnosis not present

## 2015-08-21 DIAGNOSIS — Z780 Asymptomatic menopausal state: Secondary | ICD-10-CM | POA: Diagnosis not present

## 2015-08-21 LAB — POCT HEMOGLOBIN-HEMACUE: HEMOGLOBIN: 11 g/dL — AB (ref 12.0–15.0)

## 2015-09-02 ENCOUNTER — Ambulatory Visit (INDEPENDENT_AMBULATORY_CARE_PROVIDER_SITE_OTHER): Payer: Medicare Other | Admitting: Physician Assistant

## 2015-09-02 ENCOUNTER — Encounter: Payer: Self-pay | Admitting: Physician Assistant

## 2015-09-02 ENCOUNTER — Other Ambulatory Visit: Payer: Self-pay | Admitting: Behavioral Health

## 2015-09-02 VITALS — BP 160/70 | HR 67 | Temp 97.5°F | Ht 63.0 in | Wt 139.2 lb

## 2015-09-02 DIAGNOSIS — R300 Dysuria: Secondary | ICD-10-CM | POA: Diagnosis not present

## 2015-09-02 LAB — POC URINALSYSI DIPSTICK (AUTOMATED)
Bilirubin, UA: NEGATIVE
Glucose, UA: NEGATIVE
KETONES UA: NEGATIVE
Nitrite, UA: NEGATIVE
PH UA: 5.5
Spec Grav, UA: 1.025
UROBILINOGEN UA: 0.2

## 2015-09-02 MED ORDER — CEFUROXIME AXETIL 250 MG PO TABS: 250.0000 mg | ORAL_TABLET | Freq: Two times a day (BID) | ORAL | Status: DC

## 2015-09-02 MED ORDER — CEFUROXIME AXETIL 250 MG PO TABS: 250.0000 mg | ORAL_TABLET | Freq: Every day | ORAL | Status: DC

## 2015-09-02 NOTE — Progress Notes (Signed)
Patient presents to clinic today c/o 2 days of urinary urgency, frequency, incomplete bladder emptying and dysuria. Patient endorses history of UTI, stating this feels similar. Denies fever, chills, nausea or vomiting. Denies back pain. History of Renal cell carcinoma s/p bilateral nephrectomy and renal transplant (2006). Is followed by Nephrology ([place]).  Endorses renal  Past Medical History  Diagnosis Date  . Crohn's disease ([place])   . Hypertension   . Refusal of blood transfusion for reasons of conscience 12/20/2011    pt is Jehovah's Witness  . Atrial fibrillation ([place])   . DVT of leg (deep venous thrombosis) ([place]) 2012-8./13/2013    "have had one in each leg now"  . Numbness and tingling of foot     bilaterally  . GERD (gastroesophageal reflux disease)   . Hypothyroidism   . Lower GI bleed 2012  . DVT of lower extremity, bilateral ([place])     "have had them in both legs; last one was on the RLE this year" (04/25/2012)  . OSA on CPAP   . Anemia     "when lupus flares up"  . SLE (systemic lupus erythematosus) ([place])   . Arthritis     "in my joints"  . Renal disorder     S/P nephrectomy; dialysis; "working fine now" (12/20/11)  . Renal cell carcinoma   . CAP (community acquired pneumonia) 08/20/2014  . Anxiety   . Gout 2016    Current Outpatient Prescriptions on File Prior to Visit  Medication Sig Dispense Refill  . acetaminophen (TYLENOL) 325 MG tablet Take 650 mg by mouth every 6 (six) hours as needed for mild pain.    Marland Kitchen allopurinol (ZYLOPRIM) 100 MG tablet Take 100 mg by mouth 2 (two) times daily.     Marland Kitchen amiodarone (PACERONE) 100 MG tablet Take 0.5 tablets by mouth 2 (two) times daily.  0  . calcitRIOL (ROCALTROL) 0.25 MCG capsule Take 0.25 mcg by mouth daily.     Marland Kitchen dextromethorphan-guaiFENesin (MUCINEX DM) 30-600 MG per 12 hr tablet Take 1 tablet by mouth 2 (two) times daily as needed. Reported on 06/25/2015    . epoetin alfa (EPOGEN,PROCRIT) 71062 UNIT/ML injection  Inject 40,000 Units into the skin every 21 ( twenty-one) days.     . feeding supplement, ENSURE ENLIVE, (ENSURE ENLIVE) LIQD Take 237 mLs by mouth 2 (two) times daily between meals. (Patient taking differently: Take 237 mLs by mouth 2 (two) times daily as needed. ) 60 Bottle 12  . gabapentin (NEURONTIN) 100 MG capsule Take 200 mg by mouth at bedtime.     Marland Kitchen levothyroxine (SYNTHROID, LEVOTHROID) 100 MCG tablet Take 100 mcg by mouth daily.    Marland Kitchen MAGNESIUM-OXIDE 400 (241.3 MG) MG tablet Take 400 mg by mouth 2 (two) times daily.  0  . metoprolol tartrate (LOPRESSOR) 25 MG tablet Take 1 tablet by mouth 2 (two) times daily.    . mycophenolate (MYFORTIC) 180 MG EC tablet Take 360 mg by mouth 2 (two) times daily.      Marland Kitchen omeprazole (PRILOSEC) 20 MG capsule Take 20 mg by mouth daily.     . polyethylene glycol (MIRALAX / GLYCOLAX) packet Take 17 g by mouth 2 (two) times daily as needed. For constipation    . polyvinyl alcohol-povidone (HYPOTEARS) 1.4-0.6 % ophthalmic solution Place 1-2 drops into both eyes 4 (four) times daily as needed (dry eyes).    . predniSONE (DELTASONE) 5 MG tablet Take 5 mg by mouth daily.     . tacrolimus (  PROGRAF) 1 MG capsule Take 4 mg by mouth 2 (two) times daily.     Marland Kitchen warfarin (COUMADIN) 2.5 MG tablet Take 2.5 mg by mouth daily.      No current facility-administered medications on file prior to visit.    Allergies  Allergen Reactions  . Amoxicillin Anaphylaxis  . Ampicillin Anaphylaxis  . Ciprofloxacin Swelling    "of my throat"  . Erythromycin Anaphylaxis  . Penicillins Anaphylaxis  . Sulfa Antibiotics Itching  . Sulfamethoxazole-Trimethoprim Itching  . Tetracycline Anaphylaxis  . Labetalol Nausea And Vomiting    Family History  Problem Relation Age of Onset  . Heart disease Mother   . Hypertension      runs in family  . Sleep apnea Sister     Social History   Social History  . Marital Status: Married    Spouse Name: N/A  . Number of Children: 3  . Years  of Education: N/A   Occupational History  . retired-bank teller    Social History Main Topics  . Smoking status: Never Smoker   . Smokeless tobacco: Never Used  . Alcohol Use: No  . Drug Use: No  . Sexual Activity: Yes    Birth Control/ Protection: Post-menopausal   Other Topics Concern  . Not on file   Social History Narrative    Review of Systems - See HPI.  All other ROS are negative.  LMP 03/05/2014  Physical Exam  Recent Results (from the past 2160 hour(s))  Hemoglobin-hemacue, POC     Status: Abnormal   Collection Time: 07/09/15  1:46 PM  Result Value Ref Range   Hemoglobin 10.8 (L) 12.0 - 15.0 g/dL  Renal function panel     Status: Abnormal   Collection Time: 07/09/15  1:57 PM  Result Value Ref Range   Sodium 142 135 - 145 mmol/L   Potassium 4.6 3.5 - 5.1 mmol/L   Chloride 109 101 - 111 mmol/L   CO2 20 (L) 22 - 32 mmol/L   Glucose, Bld 81 65 - 99 mg/dL   BUN 44 (H) 6 - 20 mg/dL   Creatinine, Ser 2.16 (H) 0.44 - 1.00 mg/dL   Calcium 9.6 8.9 - 10.3 mg/dL   Phosphorus 3.3 2.5 - 4.6 mg/dL   Albumin 3.8 3.5 - 5.0 g/dL   GFR calc non Af Amer 22 (L) >60 mL/min   GFR calc Af Amer 25 (L) >60 mL/min    Comment: (NOTE) The eGFR has been calculated using the CKD EPI equation. This calculation has not been validated in all clinical situations. eGFR's persistently <60 mL/min signify possible Chronic Kidney Disease.    Anion gap 13 5 - 15  Iron and TIBC     Status: Abnormal   Collection Time: 07/09/15  1:57 PM  Result Value Ref Range   Iron 73 28 - 170 ug/dL   TIBC 231 (L) 250 - 450 ug/dL   Saturation Ratios 32 (H) 10.4 - 31.8 %   UIBC 158 ug/dL  Ferritin     Status: Abnormal   Collection Time: 07/09/15  1:57 PM  Result Value Ref Range   Ferritin 834 (H) 11 - 307 ng/mL  Hemoglobin-hemacue, POC     Status: Abnormal   Collection Time: 07/30/15 12:52 PM  Result Value Ref Range   Hemoglobin 11.0 (L) 12.0 - 15.0 g/dL  Protime-INR     Status: Abnormal    Collection Time: 07/30/15  1:01 PM  Result Value Ref Range   Prothrombin Time  26.2 (H) 11.6 - 15.2 seconds   INR 2.44 (H) 0.00 - 1.49  Hemoglobin-hemacue, POC     Status: Abnormal   Collection Time: 08/20/15  1:07 PM  Result Value Ref Range   Hemoglobin 11.0 (L) 12.0 - 15.0 g/dL  Renal function panel     Status: Abnormal   Collection Time: 08/20/15  1:10 PM  Result Value Ref Range   Sodium 140 135 - 145 mmol/L   Potassium 4.8 3.5 - 5.1 mmol/L   Chloride 107 101 - 111 mmol/L   CO2 22 22 - 32 mmol/L   Glucose, Bld 108 (H) 65 - 99 mg/dL   BUN 44 (H) 6 - 20 mg/dL   Creatinine, Ser 2.46 (H) 0.44 - 1.00 mg/dL   Calcium 9.3 8.9 - 10.3 mg/dL   Phosphorus 4.0 2.5 - 4.6 mg/dL   Albumin 3.7 3.5 - 5.0 g/dL   GFR calc non Af Amer 18 (L) >60 mL/min   GFR calc Af Amer 21 (L) >60 mL/min    Comment: (NOTE) The eGFR has been calculated using the CKD EPI equation. This calculation has not been validated in all clinical situations. eGFR's persistently <60 mL/min signify possible Chronic Kidney Disease.    Anion gap 11 5 - 15  Iron and TIBC     Status: Abnormal   Collection Time: 08/20/15  1:10 PM  Result Value Ref Range   Iron 115 28 - 170 ug/dL   TIBC 231 (L) 250 - 450 ug/dL   Saturation Ratios 50 (H) 10.4 - 31.8 %   UIBC 116 ug/dL  Ferritin     Status: Abnormal   Collection Time: 08/20/15  1:10 PM  Result Value Ref Range   Ferritin 599 (H) 11 - 307 ng/mL    Assessment/Plan: 1. Dysuria Urine dip with LE and blood. Classic symptoms and history significant for UTI. Urine sent for culture. Supportive measures reviewed. Will begin ABX. Patient CrCl at 21. Is allergic to penicillins, tetracyclines, sulfa antibiotics and FQs. Has tolerated Ceftin well before. Will Rx Ceftin 250 mg once daily for 5 days while culture pending.   - POCT Urinalysis Dipstick (Automated) - CULTURE, URINE COMPREHENSIVE - cefUROXime (CEFTIN) 250 MG tablet; Take 1 tablet (250 mg total) by mouth 2 (two) times daily  with a meal.  Dispense: 5 tablet; Refill: 0

## 2015-09-02 NOTE — Patient Instructions (Signed)
Your symptoms are consistent with a bladder infection, also called acute cystitis. Please take your antibiotic (Ceftin) once daily as directed until all pills are gone.  Stay very well hydrated.  Consider a daily probiotic (Align, Culturelle, or Activia) to help prevent stomach upset caused by the antibiotic.  Taking a probiotic daily may also help prevent recurrent UTIs.  Also consider taking AZO (Phenazopyridine) tablets to help decrease pain with urination.  I will call you with your urine testing results.  We will change antibiotics if indicated.  Call or return to clinic if symptoms are not resolved by completion of antibiotic.   Urinary Tract Infection A urinary tract infection (UTI) can occur any place along the urinary tract. The tract includes the kidneys, ureters, bladder, and urethra. A type of germ called bacteria often causes a UTI. UTIs are often helped with antibiotic medicine.  HOME CARE   If given, take antibiotics as told by your doctor. Finish them even if you start to feel better.  Drink enough fluids to keep your pee (urine) clear or pale yellow.  Avoid tea, drinks with caffeine, and bubbly (carbonated) drinks.  Pee often. Avoid holding your pee in for a long time.  Pee before and after having sex (intercourse).  Wipe from front to back after you poop (bowel movement) if you are a woman. Use each tissue only once. GET HELP RIGHT AWAY IF:   You have back pain.  You have lower belly (abdominal) pain.  You have chills.  You feel sick to your stomach (nauseous).  You throw up (vomit).  Your burning or discomfort with peeing does not go away.  You have a fever.  Your symptoms are not better in 3 days. MAKE SURE YOU:   Understand these instructions.  Will watch your condition.  Will get help right away if you are not doing well or get worse. Document Released: 10/12/2007 Document Revised: 01/18/2012 Document Reviewed: 11/24/2011 [facility] Patient Information  2015 [place], [place]. This information is not intended to replace advice given to you by your health care provider. Make sure you discuss any questions you have with your health care provider.

## 2015-09-02 NOTE — Progress Notes (Signed)
Pre visit review using our clinic review tool, if applicable. No additional management support is needed unless otherwise documented below in the visit note. 

## 2015-09-04 ENCOUNTER — Telehealth: Payer: Self-pay | Admitting: Physician Assistant

## 2015-09-04 LAB — CULTURE, URINE COMPREHENSIVE

## 2015-09-04 NOTE — Telephone Encounter (Signed)
Opened in error

## 2015-09-07 DIAGNOSIS — E782 Mixed hyperlipidemia: Secondary | ICD-10-CM | POA: Diagnosis not present

## 2015-09-07 DIAGNOSIS — N2589 Other disorders resulting from impaired renal tubular function: Secondary | ICD-10-CM | POA: Diagnosis not present

## 2015-09-07 DIAGNOSIS — I4891 Unspecified atrial fibrillation: Secondary | ICD-10-CM | POA: Diagnosis not present

## 2015-09-07 DIAGNOSIS — I129 Hypertensive chronic kidney disease with stage 1 through stage 4 chronic kidney disease, or unspecified chronic kidney disease: Secondary | ICD-10-CM | POA: Diagnosis not present

## 2015-09-07 DIAGNOSIS — D631 Anemia in chronic kidney disease: Secondary | ICD-10-CM | POA: Diagnosis not present

## 2015-09-07 DIAGNOSIS — Z8719 Personal history of other diseases of the digestive system: Secondary | ICD-10-CM | POA: Diagnosis not present

## 2015-09-07 DIAGNOSIS — M109 Gout, unspecified: Secondary | ICD-10-CM | POA: Diagnosis not present

## 2015-09-07 DIAGNOSIS — N184 Chronic kidney disease, stage 4 (severe): Secondary | ICD-10-CM | POA: Diagnosis not present

## 2015-09-07 DIAGNOSIS — N2581 Secondary hyperparathyroidism of renal origin: Secondary | ICD-10-CM | POA: Diagnosis not present

## 2015-09-07 DIAGNOSIS — Z940 Kidney transplant status: Secondary | ICD-10-CM | POA: Diagnosis not present

## 2015-09-07 DIAGNOSIS — Z7901 Long term (current) use of anticoagulants: Secondary | ICD-10-CM | POA: Diagnosis not present

## 2015-09-07 DIAGNOSIS — M329 Systemic lupus erythematosus, unspecified: Secondary | ICD-10-CM | POA: Diagnosis not present

## 2015-09-24 ENCOUNTER — Ambulatory Visit (HOSPITAL_COMMUNITY)
Admission: RE | Admit: 2015-09-24 | Discharge: 2015-09-24 | Disposition: A | Discharge status: Home / Self Care | Payer: Medicare Other | Source: Ambulatory Visit | Attending: Nephrology | Admitting: Nephrology

## 2015-09-24 DIAGNOSIS — Z5181 Encounter for therapeutic drug level monitoring: Secondary | ICD-10-CM | POA: Insufficient documentation

## 2015-09-24 DIAGNOSIS — N183 Chronic kidney disease, stage 3 (moderate): Secondary | ICD-10-CM | POA: Insufficient documentation

## 2015-09-24 DIAGNOSIS — D631 Anemia in chronic kidney disease: Secondary | ICD-10-CM | POA: Diagnosis not present

## 2015-09-24 DIAGNOSIS — Z79899 Other long term (current) drug therapy: Secondary | ICD-10-CM | POA: Diagnosis not present

## 2015-09-24 LAB — IRON AND TIBC
Iron: 56 ug/dL (ref 28–170)
SATURATION RATIOS: 26 % (ref 10.4–31.8)
TIBC: 213 ug/dL — AB (ref 250–450)
UIBC: 157 ug/dL

## 2015-09-24 LAB — RENAL FUNCTION PANEL
ANION GAP: 9 (ref 5–15)
Albumin: 3.5 g/dL (ref 3.5–5.0)
BUN: 42 mg/dL — ABNORMAL HIGH (ref 6–20)
CHLORIDE: 105 mmol/L (ref 101–111)
CO2: 22 mmol/L (ref 22–32)
Calcium: 9 mg/dL (ref 8.9–10.3)
Creatinine, Ser: 2.58 mg/dL — ABNORMAL HIGH (ref 0.44–1.00)
GFR calc non Af Amer: 17 mL/min — ABNORMAL LOW (ref >60)
GFR, EST AFRICAN AMERICAN: 20 mL/min — AB (ref >60)
GLUCOSE: 115 mg/dL — AB (ref 65–99)
POTASSIUM: 4.9 mmol/L (ref 3.5–5.1)
Phosphorus: 3.9 mg/dL (ref 2.5–4.6)
Sodium: 136 mmol/L (ref 135–145)

## 2015-09-24 LAB — PROTIME-INR
INR: 3.35 — ABNORMAL HIGH (ref 0.00–1.49)
PROTHROMBIN TIME: 33.3 s — AB (ref 11.6–15.2)

## 2015-09-24 LAB — FERRITIN: FERRITIN: 578 ng/mL — AB (ref 11–307)

## 2015-09-24 LAB — POCT HEMOGLOBIN-HEMACUE: Hemoglobin: 9.6 g/dL — ABNORMAL LOW (ref 12.0–15.0)

## 2015-09-24 MED ORDER — EPOETIN ALFA 10000 UNIT/ML IJ SOLN: 40000.0000 [IU] | INTRAMUSCULAR | Status: DC

## 2015-09-24 MED ORDER — EPOETIN ALFA 40000 UNIT/ML IJ SOLN
INTRAMUSCULAR | Status: AC
Start: 2015-09-24 — End: 2015-09-24
  Administered 2015-09-24: 40000 [IU] via SUBCUTANEOUS
  Filled 2015-09-24: qty 1

## 2015-09-25 ENCOUNTER — Ambulatory Visit (INDEPENDENT_AMBULATORY_CARE_PROVIDER_SITE_OTHER): Payer: Medicare Other | Admitting: Medical

## 2015-09-25 ENCOUNTER — Encounter: Payer: Self-pay | Admitting: Medical

## 2015-09-25 VITALS — BP 130/66 | HR 88 | Temp 98.1°F | Ht 63.0 in | Wt 139.0 lb

## 2015-09-25 DIAGNOSIS — J309 Allergic rhinitis, unspecified: Secondary | ICD-10-CM

## 2015-09-25 DIAGNOSIS — R05 Cough: Secondary | ICD-10-CM | POA: Diagnosis not present

## 2015-09-25 DIAGNOSIS — R42 Dizziness and giddiness: Secondary | ICD-10-CM | POA: Diagnosis not present

## 2015-09-25 DIAGNOSIS — J209 Acute bronchitis, unspecified: Secondary | ICD-10-CM

## 2015-09-25 DIAGNOSIS — R059 Cough, unspecified: Secondary | ICD-10-CM

## 2015-09-25 LAB — TACROLIMUS LEVEL: TACROLIMUS (FK506) - LABCORP: 6.1 ng/mL (ref 2.0–20.0)

## 2015-09-25 MED ORDER — CEPHALEXIN 500 MG PO CAPS: 500.0000 mg | ORAL_CAPSULE | Freq: Two times a day (BID) | ORAL | Status: DC

## 2015-09-25 MED ORDER — BENZONATATE 100 MG PO CAPS: 100.0000 mg | ORAL_CAPSULE | Freq: Three times a day (TID) | ORAL | Status: DC | PRN

## 2015-09-25 MED ORDER — FLUTICASONE PROPIONATE 50 MCG/ACT NA SUSP: 2.0000 | Freq: Every day | NASAL | Status: DC

## 2015-09-25 NOTE — Progress Notes (Signed)
Pre visit review using our clinic review tool, if applicable. No additional management support is needed unless otherwise documented below in the visit note. 

## 2015-09-25 NOTE — Patient Instructions (Addendum)
For dizziness I offered meclizine but you declined. If dizziness becomes more constant then could rx. If you get dizziness with neurologic signs or  symptoms then  ED evaluation. If your dizziness persists with head movement may refer to PT  For nasal congestion flonase.  For cough benzonatate.  For bronchitis keflex.  Follow up in 7-10 days or as needed

## 2015-09-25 NOTE — Progress Notes (Signed)
Subjective:    Patient ID: [name], female    DOB: 1941/04/11, 75 y.o.   MRN: [id number]  HPI  Pt in feeling some slight dizziness just recently. Last week she had some cough and congestion.(some sneezing).   Pt states cough is still persistent and bringing up some mucous. Pt has been on ceftin, keflex and rocephin various times in past  and no reaction. Pt is sure of this. She is concerned since illness so long she may get bronchitis.  Pt states dizziness just started past week around same time as congestion. Some ear pain last week. Pt flew to [place].   Pt this week when she lays down and closes her eyes will feel dizzy and room spins very briefly. States changes positions and will get dizzy transient.  No ha,no vomiting, no visoin changes, no slurred speech and no gross motor or sensory function deficits.   Review of Systems  Constitutional: Negative for fever, chills and fatigue.  HENT: Positive for congestion and sneezing. Negative for ear discharge, postnasal drip, rhinorrhea and sinus pressure.        Early on.  Respiratory: Positive for cough. Negative for choking, shortness of breath and wheezing.   Cardiovascular: Negative for chest pain and palpitations.  Gastrointestinal: Negative for abdominal pain.  Musculoskeletal: Negative for back pain.  Neurological: Positive for dizziness. Negative for speech difficulty, weakness, light-headedness, numbness and headaches.  Hematological: Negative for adenopathy. Does not bruise/bleed easily.  Psychiatric/Behavioral: Negative for behavioral problems and confusion.   Past Medical History  Diagnosis Date  . Crohn's disease ([place])   . Hypertension   . Refusal of blood transfusion for reasons of conscience 12/20/2011    pt is Jehovah's Witness  . Atrial fibrillation ([place])   . DVT of leg (deep venous thrombosis) ([place]) 2012-8./13/2013    "have had one in each leg now"  . Numbness and tingling of foot     bilaterally  .  GERD (gastroesophageal reflux disease)   . Hypothyroidism   . Lower GI bleed 2012  . DVT of lower extremity, bilateral ([place])     "have had them in both legs; last one was on the RLE this year" (04/25/2012)  . OSA on CPAP   . Anemia     "when lupus flares up"  . SLE (systemic lupus erythematosus) ([place])   . Arthritis     "in my joints"  . Renal disorder     S/P nephrectomy; dialysis; "working fine now" (12/20/11)  . Renal cell carcinoma   . CAP (community acquired pneumonia) 08/20/2014  . Anxiety   . Gout 2016     Social History   Social History  . Marital Status: Married    Spouse Name: N/A  . Number of Children: 3  . Years of Education: N/A   Occupational History  . retired-bank teller    Social History Main Topics  . Smoking status: Never Smoker   . Smokeless tobacco: Never Used  . Alcohol Use: No  . Drug Use: No  . Sexual Activity: Yes    Birth Control/ Protection: Post-menopausal   Other Topics Concern  . Not on file   Social History Narrative    Past Surgical History  Procedure Laterality Date  . Cesarean section  1984  . Nephrectomy transplanted organ  2006    bilaterally  . Colectomy  1998    "removal of abscess" (04/25/2012)  . Excisional hemorrhoidectomy    . Cataract extraction w/ intraocular  lens  implant, bilateral  2005-2010  . Vena cava filter placement  2012  . Insertion of dialysis catheter  1988-2006    "peritoneal and hemodialysis; had multiple grafts and fistulas; last graft clotted off 2012"  . Cholecystectomy  1995  . Colostomy  1998  . Colostomy reversal  1999    "6 months after placed" (04/25/2012)  . Bone marrow biopsy  1993; 1995  . Abdominal exploration surgery  1995    "found sluggish bone marrow" (04/25/2012)  . Parathyroid implant removal Left     removed parathyroid from neck and implanted in arm  . Colonoscopy N/A 08/30/2013    Procedure: COLONOSCOPY;  Surgeon: Winfield Cunas., MD;  Location: [facility];  Service:  Endoscopy;  Laterality: N/A;    Family History  Problem Relation Age of Onset  . Heart disease Mother   . Hypertension      runs in family  . Sleep apnea Sister     Allergies  Allergen Reactions  . Amoxicillin Anaphylaxis  . Ampicillin Anaphylaxis  . Ciprofloxacin Swelling    "of my throat"  . Erythromycin Anaphylaxis  . Penicillins Anaphylaxis  . Sulfa Antibiotics Itching  . Sulfamethoxazole-Trimethoprim Itching  . Tetracycline Anaphylaxis  . Labetalol Nausea And Vomiting    Current Outpatient Prescriptions on File Prior to Visit  Medication Sig Dispense Refill  . acetaminophen (TYLENOL) 325 MG tablet Take 650 mg by mouth every 6 (six) hours as needed for mild pain.    Marland Kitchen allopurinol (ZYLOPRIM) 100 MG tablet Take 100 mg by mouth 2 (two) times daily.     Marland Kitchen amiodarone (PACERONE) 100 MG tablet Take 0.5 tablets by mouth 2 (two) times daily.  0  . calcitRIOL (ROCALTROL) 0.25 MCG capsule Take 0.25 mcg by mouth daily.     Marland Kitchen epoetin alfa (EPOGEN,PROCRIT) 46659 UNIT/ML injection Inject 40,000 Units into the skin every 21 ( twenty-one) days.     . feeding supplement, ENSURE ENLIVE, (ENSURE ENLIVE) LIQD Take 237 mLs by mouth 2 (two) times daily between meals. (Patient taking differently: Take 237 mLs by mouth 2 (two) times daily as needed. ) 60 Bottle 12  . gabapentin (NEURONTIN) 100 MG capsule Take 200 mg by mouth at bedtime.     Marland Kitchen levothyroxine (SYNTHROID, LEVOTHROID) 100 MCG tablet Take 100 mcg by mouth daily.    Marland Kitchen MAGNESIUM-OXIDE 400 (241.3 MG) MG tablet Take 400 mg by mouth 2 (two) times daily.  0  . metoprolol tartrate (LOPRESSOR) 25 MG tablet Take 1 tablet by mouth 2 (two) times daily.    . mycophenolate (MYFORTIC) 180 MG EC tablet Take 360 mg by mouth 2 (two) times daily.      . polyethylene glycol (MIRALAX / GLYCOLAX) packet Take 17 g by mouth 2 (two) times daily as needed. For constipation    . polyvinyl alcohol-povidone (HYPOTEARS) 1.4-0.6 % ophthalmic solution Place 1-2 drops  into both eyes 4 (four) times daily as needed (dry eyes).    . predniSONE (DELTASONE) 5 MG tablet Take 5 mg by mouth daily.     . tacrolimus (PROGRAF) 1 MG capsule Take 4 mg by mouth 2 (two) times daily.     Marland Kitchen warfarin (COUMADIN) 2.5 MG tablet Take 2.5 mg by mouth daily.      No current facility-administered medications on file prior to visit.    BP 130/66 mmHg  Pulse 88  Temp(Src) 98.1 F (36.7 C) (Oral)  Ht _0  (1.6 m)  Wt 139 lb (63.05 kg)  BMI 24.63 kg/m2  SpO2 96%  LMP 03/05/2014       Objective:   Physical Exam  General  Mental Status - Alert. General Appearance - Well groomed. Not in acute distress.  Skin Rashes- No Rashes.  HEENT Head- Normal. Ear Auditory Canal - Left- Normal. Right - Normal.Tympanic Membrane- Left- Normal. Right- Normal. Eye Sclera/Conjunctiva- Left- Normal. Right- Normal. Nose & Sinuses Nasal Mucosa- Left-  Boggy and Congested. Right-  Boggy and  Congested.Bilateral maxillary and frontal sinus pressure. Mouth & Throat Lips: Upper Lip- Normal: no dryness, cracking, pallor, cyanosis, or vesicular eruption. Lower Lip-Normal: no dryness, cracking, pallor, cyanosis or vesicular eruption. Buccal Mucosa- Bilateral- No Aphthous ulcers. Oropharynx- No Discharge or Erythema. Tonsils: Characteristics- Bilateral- No Erythema or Congestion. Size/Enlargement- Bilateral- No enlargement. Discharge- bilateral-None.    Neck Neck- Supple. No Masses.   Chest and Lung Exam Auscultation: Breath Sounds:-Clear even and unlabored.  Cardiovascular Auscultation:Rythm- Regular, rate and rhythm. Murmurs & Other Heart Sounds:Ausculatation of the heart reveal- No Murmurs.  Lymphatic Head & Neck General Head & Neck Lymphatics: Bilateral: Description- No Localized lymphadenopathy.    Neurologic Cranial Nerve exam:- CN III-XII intact(No nystagmus), symmetric smile. Drift Test:- No drift. Romberg Exam:- Negative.  Heal to Toe Gait exam:-Normal. Finger  to Nose:- Normal/Intact Strength:- 5/5 equal and symmetric strength both upper and lower extremities.. Faint transient vertigo for 1-2 seconds lying flat and turning head to left then resolved. Otherwise no dizziness or vertigo during interview and exam.       Assessment & Plan:  For dizziness I offered meclizine but you declined. If dizziness becomes more constant then could rx. If you get dizziness with neurologic signs or  symptoms then  ED evaluation.If your dizziness persists with head movement may refer to PT  For nasal congestion flonase.  For cough benzonatate.  For bronchitis keflex.  Follow up in 7-10 days or as needed  Conita Amenta, Percell Miller, [employer]

## 2015-09-28 NOTE — Progress Notes (Signed)
Patient ID: [name], female   DOB: [birth year], 75 y.o.   MRN: [id number]   75 y.o.  with HTN and PAF  Initially seen 09/2014 Previously seen by [facility] and most recently by [facility] Dr Matthew Saras. Reviewed over 35 pages of records. Longstanding PAF and history of DVT On chronic coumadin. CRF with previous dialysis and transplant 2006 Baseline Cr 2.1 She is on low dose amiodarone 50 bid noted labs 1/14 wit suppressed TSH .24 ( LLN .34) She has been maint NSR with no dyspnea chest pain or palpitations No echo's but patient indicates normal LV function. She uses Dr Dederding as primary.   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 Wall thickness was increased in a pattern of moderate LVH. - Aortic valve: There was mild regurgitation. - Mitral valve: Calcified annulus. There was mild regurgitation. - Left atrium: The atrium was mildly dilated. - Atrial septum: No defect or patent foramen ovale was identified.  Having exertional dyspnea.  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  All other systems reviewed and negative  General: Affect appropriate Mildly cushingoid  HEENT: normal Neck supple with no adenopathy JVP normal no bruits no thyromegaly Lungs clear with no wheezing and good diaphragmatic motion Heart:  S1/S2 AR  murmur, no rub, gallop or click old dialysis graft LUE PMI normal Abdomen: benighn, BS positve, no tenderness, no AAA old dialysis graft left groin no bruit.  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 acetaminophen (TYLENOL) 325 MG tablet Take 650 mg by mouth every 6 (six) hours as needed for mild pain.    Marland Kitchen allopurinol (ZYLOPRIM) 100 MG tablet Take 100 mg by mouth 2 (two) times daily.     Marland Kitchen amiodarone (PACERONE) 100 MG tablet Take 0.5 tablets by mouth 2 (two) times daily.  0  . benzonatate (TESSALON) 100 MG capsule Take 1 capsule (100 mg total) by mouth 3 (three) times daily as needed for cough. 21 capsule 0  . calcitRIOL (ROCALTROL) 0.25 MCG capsule Take 0.25 mcg by mouth daily.     . cephALEXin (KEFLEX) 500 MG capsule Take 1 capsule (500 mg total) by mouth 2 (two) times daily. 14 capsule 0  . epoetin alfa (EPOGEN,PROCRIT) 70177 UNIT/ML injection Inject 40,000 Units into the skin every 21 ( twenty-one) days.     . feeding supplement, ENSURE ENLIVE, (ENSURE ENLIVE) LIQD Take 237 mLs by mouth 2 (two) times daily between meals. 60 Bottle 12  . fluticasone (FLONASE) 50 MCG/ACT nasal spray Place 2 sprays into both nostrils daily. 16 g 1  . gabapentin (NEURONTIN) 100 MG capsule Take 200 mg by mouth at bedtime.     Marland Kitchen levothyroxine (SYNTHROID, LEVOTHROID) 100 MCG tablet Take 100 mcg by mouth daily.    Marland Kitchen MAGNESIUM-OXIDE 400 (241.3 MG) MG tablet Take 400 mg by mouth 2 (two) times daily.  0  . metoprolol tartrate (LOPRESSOR) 25 MG tablet Take 1 tablet by mouth 2 (two) times daily.    . mycophenolate (MYFORTIC)  180 MG EC tablet Take 360 mg by mouth 2 (two) times daily.      . polyethylene glycol (MIRALAX / GLYCOLAX) packet Take 17 g by mouth 2 (two) times daily as needed. For constipation    . polyvinyl alcohol-povidone (HYPOTEARS) 1.4-0.6 % ophthalmic solution Place 1-2 drops into both eyes 4 (four) times daily as needed (dry eyes).    . predniSONE (DELTASONE) 5 MG tablet Take 5 mg by mouth daily.     . tacrolimus (PROGRAF) 1 MG capsule Take 4 mg by mouth 2 (two) times daily.     Marland Kitchen warfarin (COUMADIN) 2.5 MG tablet Take  2.5 mg by mouth daily.      No current facility-administered medications for this visit.    Allergies  Amoxicillin; Ampicillin; Ciprofloxacin; Erythromycin; Penicillins; Sulfa antibiotics; Sulfamethoxazole-trimethoprim; Tetracycline; and Labetalol  Electrocardiogram:  NSR RBBB   09/04/13   NSR RBBB rate 77 no change from 2014  Assessment and Plan AR:  Mild no change in murmur f/u echo given dyspnea  CRF:  F/U Dr Dederding  Baseline Cr mid 2's on prograf and deltasone Gout:  On allopurinol  Had pulse dose prednisone for Rx PAF: maint NSR  On low dose amiodarone check PFTls and DLCO given dyspnea  DVT:  History of 12/2011 extending from common femoral to calf veins  RBBB:  Chronic no change no high grade AV block yearly ECG   Jenkins Rouge

## 2015-09-30 ENCOUNTER — Ambulatory Visit (HOSPITAL_BASED_OUTPATIENT_CLINIC_OR_DEPARTMENT_OTHER)
Admission: RE | Admit: 2015-09-30 | Discharge: 2015-09-30 | Disposition: A | Discharge status: Home / Self Care | Payer: Medicare Other | Source: Ambulatory Visit | Attending: Cardiovascular Disease | Admitting: Cardiovascular Disease

## 2015-09-30 ENCOUNTER — Encounter: Payer: Self-pay | Admitting: Cardiovascular Disease

## 2015-09-30 ENCOUNTER — Ambulatory Visit (INDEPENDENT_AMBULATORY_CARE_PROVIDER_SITE_OTHER): Payer: Medicare Other | Admitting: Cardiovascular Disease

## 2015-09-30 VITALS — BP 144/68 | HR 63 | Ht 62.0 in | Wt 139.8 lb

## 2015-09-30 DIAGNOSIS — Z09 Encounter for follow-up examination after completed treatment for conditions other than malignant neoplasm: Secondary | ICD-10-CM

## 2015-09-30 DIAGNOSIS — I351 Nonrheumatic aortic (valve) insufficiency: Secondary | ICD-10-CM | POA: Insufficient documentation

## 2015-09-30 DIAGNOSIS — R0602 Shortness of breath: Secondary | ICD-10-CM | POA: Diagnosis not present

## 2015-09-30 DIAGNOSIS — R0789 Other chest pain: Secondary | ICD-10-CM

## 2015-09-30 LAB — ECHOCARDIOGRAM COMPLETE
HEIGHTINCHES: 62 in
Weight: 2236.8 oz

## 2015-09-30 NOTE — Progress Notes (Signed)
Echocardiogram 2D Echocardiogram has been performed.  Joelene Millin 09/30/2015, 12:23 PM

## 2015-09-30 NOTE — Patient Instructions (Addendum)
Medication Instructions:  Your physician recommends that you continue on your current medications as directed. Please refer to the Current Medication list given to you today.  Labwork: NONE  Testing/Procedures: Your physician has requested that you have an echocardiogram. Echocardiography is a painless test that uses sound waves to create images of your heart. It provides your doctor with information about the size and shape of your heart and how well your heart's chambers and valves are working. This procedure takes approximately one hour. There are no restrictions for this procedure.  Your physician has requested that you have en exercise stress myoview. For further information please visit [email or web address]. Please follow instruction sheet, as given.  Your physician has requested that you have a pulmonary function test.   Follow-Up: Your physician wants you to follow-up in: 6 months with Dr. Johnsie Cancel. You will receive a reminder letter in the mail two months in advance. If you don't receive a letter, please call our office to schedule the follow-up appointment.   If you need a refill on your cardiac medications before your next appointment, please call your pharmacy.

## 2015-10-01 ENCOUNTER — Ambulatory Visit (INDEPENDENT_AMBULATORY_CARE_PROVIDER_SITE_OTHER): Payer: Medicare Other | Admitting: Internal Medicine

## 2015-10-01 DIAGNOSIS — R0602 Shortness of breath: Secondary | ICD-10-CM

## 2015-10-01 LAB — PULMONARY FUNCTION TEST
DL/VA % pred: 73 %
DL/VA: 3.34 ml/min/mmHg/L
DLCO cor % pred: 48 %
DLCO cor: 10.45 ml/min/mmHg
DLCO unc % pred: 46 %
DLCO unc: 9.97 ml/min/mmHg
FEF 25-75 Post: 1.89 L/s
FEF 25-75 Pre: 2.21 L/s
FEF2575-%Change-Post: -14 %
FEF2575-%Pred-Post: 129 %
FEF2575-%Pred-Pre: 151 %
FEV1-%Change-Post: -2 %
FEV1-%Pred-Post: 102 %
FEV1-%Pred-Pre: 105 %
FEV1-Post: 1.64 L
FEV1-Pre: 1.69 L
FEV1FVC-%Change-Post: 3 %
FEV1FVC-%Pred-Pre: 110 %
FEV6-%Change-Post: -6 %
FEV6-%Pred-Post: 94 %
FEV6-%Pred-Pre: 100 %
FEV6-Post: 1.87 L
FEV6-Pre: 1.99 L
FEV6FVC-%Pred-Post: 104 %
FEV6FVC-%Pred-Pre: 104 %
FVC-%Change-Post: -6 %
FVC-%Pred-Post: 90 %
FVC-%Pred-Pre: 95 %
FVC-Post: 1.87 L
FVC-Pre: 1.99 L
Post FEV1/FVC ratio: 88 %
Post FEV6/FVC ratio: 100 %
Pre FEV1/FVC ratio: 85 %
Pre FEV6/FVC Ratio: 100 %
RV % pred: 60 %
RV: 1.3 L
TLC % pred: 68 %
TLC: 3.26 L

## 2015-10-01 NOTE — Progress Notes (Signed)
PFT done today. 

## 2015-10-02 ENCOUNTER — Telehealth: Payer: Self-pay

## 2015-10-02 NOTE — Telephone Encounter (Signed)
-----   Message from Josue Hector, MD sent at 10/02/2015  8:59 AM EDT ----- DLCO significantly decreased stop amiodarone and have her f/u with pulmonary to follow

## 2015-10-02 NOTE — Telephone Encounter (Signed)
Patient aware of PFT results. Per Dr. Johnsie Cancel, DLCO significantly decreased stop amiodarone and have her f/u with pulmonary to follow. Patient verbalized understanding.

## 2015-10-06 ENCOUNTER — Telehealth (HOSPITAL_COMMUNITY): Payer: Self-pay | Admitting: *Deleted

## 2015-10-06 NOTE — Telephone Encounter (Signed)
Patient given detailed instructions per Myocardial Perfusion Study Information Sheet for the test on 10/08/15 at 1000. Patient notified to arrive 15 minutes early and that it is imperative to arrive on time for appointment to keep from having the test rescheduled.  If you need to cancel or reschedule your appointment, please call the office within 24 hours of your appointment. Failure to do so may result in a cancellation of your appointment, and a $50 no show fee. Patient verbalized understanding.[facility], Ranae Palms

## 2015-10-08 ENCOUNTER — Ambulatory Visit (HOSPITAL_COMMUNITY): Payer: Medicare Other | Attending: Cardiology

## 2015-10-08 DIAGNOSIS — R0602 Shortness of breath: Secondary | ICD-10-CM | POA: Diagnosis not present

## 2015-10-08 DIAGNOSIS — I4510 Unspecified right bundle-branch block: Secondary | ICD-10-CM | POA: Diagnosis not present

## 2015-10-08 DIAGNOSIS — I351 Nonrheumatic aortic (valve) insufficiency: Secondary | ICD-10-CM | POA: Diagnosis not present

## 2015-10-08 DIAGNOSIS — I4891 Unspecified atrial fibrillation: Secondary | ICD-10-CM | POA: Diagnosis not present

## 2015-10-08 DIAGNOSIS — R55 Syncope and collapse: Secondary | ICD-10-CM | POA: Insufficient documentation

## 2015-10-08 DIAGNOSIS — R0789 Other chest pain: Secondary | ICD-10-CM

## 2015-10-08 DIAGNOSIS — I10 Essential (primary) hypertension: Secondary | ICD-10-CM | POA: Diagnosis not present

## 2015-10-08 LAB — MYOCARDIAL PERFUSION IMAGING
CHL CUP MPHR: 148 {beats}/min
CHL CUP NUCLEAR SDS: 9
CHL CUP NUCLEAR SRS: 3
CHL CUP NUCLEAR SSS: 12
CHL CUP RESTING HR STRESS: 60 {beats}/min
CSEPEDS: 0 s
CSEPHR: 70 %
Estimated workload: 5.3 METS
Exercise duration (min): 3 min
LV sys vol: 24 mL
LVDIAVOL: 90 mL (ref 46–106)
Peak HR: 103 {beats}/min
RATE: 0.27
RPE: 19
TID: 0.95

## 2015-10-08 MED ORDER — TECHNETIUM TC 99M TETROFOSMIN IV KIT
31.7000 | PACK | Freq: Once | INTRAVENOUS | Status: AC | PRN
  Administered 2015-10-08: 31.7 via INTRAVENOUS
  Filled 2015-10-08: qty 32

## 2015-10-08 MED ORDER — TECHNETIUM TC 99M TETROFOSMIN IV KIT
11.0000 | PACK | Freq: Once | INTRAVENOUS | Status: AC | PRN
Start: 2015-10-08 — End: 2015-10-08
  Administered 2015-10-08: 11 via INTRAVENOUS
  Filled 2015-10-08: qty 11

## 2015-10-08 MED ORDER — REGADENOSON 0.4 MG/5ML IV SOLN
0.4000 mg | Freq: Once | INTRAVENOUS | Status: AC
  Administered 2015-10-08: 0.4 mg via INTRAVENOUS

## 2015-10-09 ENCOUNTER — Telehealth: Payer: Self-pay | Admitting: Cardiovascular Disease

## 2015-10-09 NOTE — Telephone Encounter (Signed)
New message   Pt is returning call for rn

## 2015-10-09 NOTE — Telephone Encounter (Signed)
Patient given stress test results

## 2015-10-12 DIAGNOSIS — Z7901 Long term (current) use of anticoagulants: Secondary | ICD-10-CM | POA: Diagnosis not present

## 2015-10-13 LAB — PROTIME-INR: INR: 2.3 — AB (ref 0.9–1.1)

## 2015-10-15 ENCOUNTER — Encounter (HOSPITAL_COMMUNITY)
Admission: RE | Admit: 2015-10-15 | Discharge: 2015-10-15 | Disposition: A | Discharge status: Home / Self Care | Payer: Medicare Other | Source: Ambulatory Visit | Attending: Nephrology | Admitting: Nephrology

## 2015-10-15 DIAGNOSIS — D631 Anemia in chronic kidney disease: Secondary | ICD-10-CM | POA: Insufficient documentation

## 2015-10-15 DIAGNOSIS — N183 Chronic kidney disease, stage 3 (moderate): Secondary | ICD-10-CM | POA: Diagnosis not present

## 2015-10-15 LAB — POCT HEMOGLOBIN-HEMACUE: Hemoglobin: 10.6 g/dL — ABNORMAL LOW (ref 12.0–15.0)

## 2015-10-15 MED ORDER — EPOETIN ALFA 10000 UNIT/ML IJ SOLN
40000.0000 [IU] | INTRAMUSCULAR | Status: DC
  Administered 2015-10-15: 40000 [IU] via SUBCUTANEOUS

## 2015-10-15 MED ORDER — EPOETIN ALFA 40000 UNIT/ML IJ SOLN
INTRAMUSCULAR | Status: AC
  Filled 2015-10-15: qty 1

## 2015-10-16 MED FILL — Epoetin Alfa Inj 40000 Unit/ML: INTRAMUSCULAR | Qty: 1 | Status: AC

## 2015-10-31 ENCOUNTER — Encounter (HOSPITAL_BASED_OUTPATIENT_CLINIC_OR_DEPARTMENT_OTHER): Payer: Self-pay | Admitting: *Deleted

## 2015-10-31 ENCOUNTER — Emergency Department (HOSPITAL_BASED_OUTPATIENT_CLINIC_OR_DEPARTMENT_OTHER): Payer: Medicare Other

## 2015-10-31 ENCOUNTER — Emergency Department (HOSPITAL_BASED_OUTPATIENT_CLINIC_OR_DEPARTMENT_OTHER)
Admission: EM | Admit: 2015-10-31 | Discharge: 2015-10-31 | Disposition: A | Discharge status: Home / Self Care | Payer: Medicare Other | Attending: Emergency Medicine | Admitting: Emergency Medicine

## 2015-10-31 DIAGNOSIS — N390 Urinary tract infection, site not specified: Secondary | ICD-10-CM | POA: Diagnosis not present

## 2015-10-31 DIAGNOSIS — R509 Fever, unspecified: Secondary | ICD-10-CM | POA: Diagnosis not present

## 2015-10-31 DIAGNOSIS — E039 Hypothyroidism, unspecified: Secondary | ICD-10-CM | POA: Diagnosis not present

## 2015-10-31 DIAGNOSIS — M1990 Unspecified osteoarthritis, unspecified site: Secondary | ICD-10-CM | POA: Insufficient documentation

## 2015-10-31 DIAGNOSIS — I4891 Unspecified atrial fibrillation: Secondary | ICD-10-CM | POA: Diagnosis not present

## 2015-10-31 DIAGNOSIS — Z79899 Other long term (current) drug therapy: Secondary | ICD-10-CM | POA: Insufficient documentation

## 2015-10-31 DIAGNOSIS — Z7901 Long term (current) use of anticoagulants: Secondary | ICD-10-CM | POA: Diagnosis not present

## 2015-10-31 DIAGNOSIS — I10 Essential (primary) hypertension: Secondary | ICD-10-CM | POA: Insufficient documentation

## 2015-10-31 DIAGNOSIS — Z86718 Personal history of other venous thrombosis and embolism: Secondary | ICD-10-CM | POA: Diagnosis not present

## 2015-10-31 DIAGNOSIS — R918 Other nonspecific abnormal finding of lung field: Secondary | ICD-10-CM | POA: Diagnosis not present

## 2015-10-31 DIAGNOSIS — R05 Cough: Secondary | ICD-10-CM | POA: Diagnosis not present

## 2015-10-31 LAB — CBC WITH DIFFERENTIAL/PLATELET
BASOS PCT: 0 %
Basophils Absolute: 0 10*3/uL (ref 0.0–0.1)
EOS ABS: 0 10*3/uL (ref 0.0–0.7)
Eosinophils Relative: 0 %
HCT: 35.6 % — ABNORMAL LOW (ref 36.0–46.0)
HEMOGLOBIN: 10.9 g/dL — AB (ref 12.0–15.0)
LYMPHS ABS: 0.2 10*3/uL — AB (ref 0.7–4.0)
Lymphocytes Relative: 2 %
MCH: 28.7 pg (ref 26.0–34.0)
MCHC: 30.6 g/dL (ref 30.0–36.0)
MCV: 93.7 fL (ref 78.0–100.0)
MONO ABS: 0.4 10*3/uL (ref 0.1–1.0)
MONOS PCT: 6 %
NEUTROS PCT: 92 %
Neutro Abs: 6.2 10*3/uL (ref 1.7–7.7)
Platelets: 101 10*3/uL — ABNORMAL LOW (ref 150–400)
RBC: 3.8 MIL/uL — ABNORMAL LOW (ref 3.87–5.11)
RDW: 15.6 % — ABNORMAL HIGH (ref 11.5–15.5)
WBC: 6.8 10*3/uL (ref 4.0–10.5)

## 2015-10-31 LAB — COMPREHENSIVE METABOLIC PANEL
ALK PHOS: 162 U/L — AB (ref 38–126)
ALT: 16 U/L (ref 14–54)
ANION GAP: 9 (ref 5–15)
AST: 23 U/L (ref 15–41)
Albumin: 4.1 g/dL (ref 3.5–5.0)
BUN: 49 mg/dL — ABNORMAL HIGH (ref 6–20)
CALCIUM: 8.9 mg/dL (ref 8.9–10.3)
CO2: 21 mmol/L — ABNORMAL LOW (ref 22–32)
CREATININE: 2.29 mg/dL — AB (ref 0.44–1.00)
Chloride: 103 mmol/L (ref 101–111)
GFR, EST AFRICAN AMERICAN: 23 mL/min — AB (ref >60)
GFR, EST NON AFRICAN AMERICAN: 20 mL/min — AB (ref >60)
Glucose, Bld: 84 mg/dL (ref 65–99)
Potassium: 4.8 mmol/L (ref 3.5–5.1)
SODIUM: 133 mmol/L — AB (ref 135–145)
TOTAL PROTEIN: 6.8 g/dL (ref 6.5–8.1)
Total Bilirubin: 0.9 mg/dL (ref 0.3–1.2)

## 2015-10-31 LAB — URINALYSIS, ROUTINE W REFLEX MICROSCOPIC
Bilirubin Urine: NEGATIVE
Glucose, UA: NEGATIVE mg/dL
HGB URINE DIPSTICK: NEGATIVE
Ketones, ur: NEGATIVE mg/dL
Nitrite: NEGATIVE
Protein, ur: NEGATIVE mg/dL
Specific Gravity, Urine: 1.017 (ref 1.005–1.030)
pH: 5.5 (ref 5.0–8.0)

## 2015-10-31 LAB — URINE MICROSCOPIC-ADD ON

## 2015-10-31 LAB — PROTIME-INR
INR: 1.7 — AB (ref 0.00–1.49)
Prothrombin Time: 20 seconds — ABNORMAL HIGH (ref 11.6–15.2)

## 2015-10-31 MED ORDER — ACETAMINOPHEN 325 MG PO TABS
650.0000 mg | ORAL_TABLET | ORAL | Status: DC | PRN
  Administered 2015-10-31: 650 mg via ORAL
  Filled 2015-10-31: qty 2

## 2015-10-31 MED ORDER — DEXTROSE 5 % IV SOLN
1.0000 g | Freq: Once | INTRAVENOUS | Status: AC
  Administered 2015-10-31: 1 g via INTRAVENOUS
  Filled 2015-10-31: qty 10

## 2015-10-31 MED ORDER — CEFUROXIME AXETIL 500 MG PO TABS: 500.0000 mg | ORAL_TABLET | Freq: Two times a day (BID) | ORAL | Status: DC

## 2015-10-31 NOTE — Discharge Instructions (Signed)
Fever, Adult °A fever is an increase in the body's temperature. It is usually defined as a temperature of 100°F (38°C) or higher. Brief mild or moderate fevers generally have no long-term effects, and they often do not require treatment. Moderate or high fevers may make you feel uncomfortable and can sometimes be a sign of a serious illness or disease. The sweating that may occur with repeated or prolonged fever may also cause dehydration. °Fever is confirmed by taking a temperature with a thermometer. A measured temperature can vary with: °· Age. °· Time of day. °· Location of the thermometer: °· Mouth (oral). °· Rectum (rectal). °· Ear (tympanic). °· Underarm (axillary). °· Forehead (temporal). °HOME CARE INSTRUCTIONS °Pay attention to any changes in your symptoms. Take these actions to help with your condition: °· Take over-the counter and prescription medicines only as told by your health care provider. Follow the dosing instructions carefully. °· If you were prescribed an antibiotic medicine, take it as told by your health care provider. Do not stop taking the antibiotic even if you start to feel better. °· Rest as needed. °· Drink enough fluid to keep your urine clear or pale yellow. This helps to prevent dehydration. °· Sponge yourself or bathe with room-temperature water to help reduce your body temperature as needed. Do not use ice water. °· Do not overbundle yourself in blankets or heavy clothes. °SEEK MEDICAL CARE IF: °· You vomit. °· You cannot eat or drink without vomiting. °· You have diarrhea. °· You have pain when you urinate. °· Your symptoms do not improve with treatment. °· You develop new symptoms. °· You develop excessive weakness. °SEEK IMMEDIATE MEDICAL CARE IF: °· You have shortness of breath or have trouble breathing. °· You are dizzy or you faint. °· You are disoriented or confused. °· You develop signs of dehydration, such as a dry mouth, decreased urination, or paleness. °· You develop  severe pain in your abdomen. °· You have persistent vomiting or diarrhea. °· You develop a skin rash. °· Your symptoms suddenly get worse. °  °This information is not intended to replace advice given to you by your health care provider. Make sure you discuss any questions you have with your health care provider. °  °Document Released: 10/19/2000 Document Revised: 01/14/2015 Document Reviewed: 06/19/2014 °Elsevier Interactive Patient Education ©2016 Else[employer] Inc[employer] ° °Urinary Tract Infection °Urinary tract infections (UTIs) can develop anywhere along your urinary tract. Your urinary tract is your body's drainage system for removing wastes and extra water. Your urinary tract includes two kidneys, two ureters, a bladder, and a urethra. Your kidneys are a pair of bean-shaped organs. Each kidney is about the size of your fist. They are located below your ribs, one on each side of your spine. °CAUSES °Infections are caused by microbes, which are microscopic organisms, including fungi, viruses, and bacteria. These organisms are so small that they can only be seen through a microscope. Bacteria are the microbes that most commonly cause UTIs. °SYMPTOMS  °Symptoms of UTIs may vary by age and gender of the patient and by the location of the infection. Symptoms in young women typically include a frequent and intense urge to urinate and a painful, burning feeling in the bladder or urethra during urination. Older women and men are more likely to be tired, shaky, and weak and have muscle aches and abdominal pain. A fever may mean the infection is in your kidneys. Other symptoms of a kidney infection include pain in your back or   sides below the ribs, nausea, and vomiting. °DIAGNOSIS °To diagnose a UTI, your caregiver will ask you about your symptoms. Your caregiver will also ask you to provide a urine sample. The urine sample will be tested for bacteria and white blood cells. White blood cells are made by your body to help fight  infection. °TREATMENT  °Typically, UTIs can be treated with medication. Because most UTIs are caused by a bacterial infection, they usually can be treated with the use of antibiotics. The choice of antibiotic and length of treatment depend on your symptoms and the type of bacteria causing your infection. °HOME CARE INSTRUCTIONS °· If you were prescribed antibiotics, take them exactly as your caregiver instructs you. Finish the medication even if you feel better after you have only taken some of the medication. °· Drink enough water and fluids to keep your urine clear or pale yellow. °· Avoid caffeine, tea, and carbonated beverages. They tend to irritate your bladder. °· Empty your bladder often. Avoid holding urine for long periods of time. °· Empty your bladder before and after sexual intercourse. °· After a bowel movement, women should cleanse from front to back. Use each tissue only once. °SEEK MEDICAL CARE IF:  °· You have back pain. °· You develop a fever. °· Your symptoms do not begin to resolve within 3 days. °SEEK IMMEDIATE MEDICAL CARE IF:  °· You have severe back pain or lower abdominal pain. °· You develop chills. °· You have nausea or vomiting. °· You have continued burning or discomfort with urination. °MAKE SURE YOU:  °· Understand these instructions. °· Will watch your condition. °· Will get help right away if you are not doing well or get worse. °  °This information is not intended to replace advice given to you by your health care provider. Make sure you discuss any questions you have with your health care provider. °  °Document Released: 02/02/2005 Document Revised: 01/14/2015 Document Reviewed: 06/03/2011 °Elsevier Interactive Patient Education ©2016 Elsevier Inc. ° °

## 2015-10-31 NOTE — ED Provider Notes (Signed)
CSN: [id number]     Arrival date & time 10/31/15  3532 History   First MD Initiated Contact with Patient 10/31/15 1016     Chief Complaint  Patient presents with  . Fever     (Consider location/radiation/quality/duration/timing/severity/associated sxs/prior Treatment) HPI Patient presents with fever starting yesterday evening. States it was up to 101.9. Had associated fatigue and mild headache with it. Denies upper respiratory symptoms. Denies shortness of breath or chest pain. No abdominal pain, nausea, vomiting or diarrhea. Patient did have increased urinary frequency yesterday but denied dysuria, urgency or hematuria. No known sick contacts. Patient has a history previous kidney transplant in 2006. She takes prednisone 5 mg daily and Prograf. Patient is also on Coumadin for atrial fibrillation. Past Medical History  Diagnosis Date  . Crohn's disease ([place])   . Hypertension   . Refusal of blood transfusion for reasons of conscience 12/20/2011    pt is Jehovah's Witness  . Atrial fibrillation ([place])   . DVT of leg (deep venous thrombosis) ([place]) 2012-8./13/2013    "have had one in each leg now"  . Numbness and tingling of foot     bilaterally  . GERD (gastroesophageal reflux disease)   . Hypothyroidism   . Lower GI bleed 2012  . DVT of lower extremity, bilateral ([place])     "have had them in both legs; last one was on the RLE this year" (04/25/2012)  . OSA on CPAP   . Anemia     "when lupus flares up"  . SLE (systemic lupus erythematosus) ([place])   . Arthritis     "in my joints"  . Renal disorder     S/P nephrectomy; dialysis; "working fine now" (12/20/11)  . Renal cell carcinoma   . CAP (community acquired pneumonia) 08/20/2014  . Anxiety   . Gout 2016   Past Surgical History  Procedure Laterality Date  . Cesarean section  1984  . Nephrectomy transplanted organ  2006    bilaterally  . Colectomy  1998    "removal of abscess" (04/25/2012)  . Excisional hemorrhoidectomy    .  Cataract extraction w/ intraocular lens  implant, bilateral  2005-2010  . Vena cava filter placement  2012  . Insertion of dialysis catheter  1988-2006    "peritoneal and hemodialysis; had multiple grafts and fistulas; last graft clotted off 2012"  . Cholecystectomy  1995  . Colostomy  1998  . Colostomy reversal  1999    "6 months after placed" (04/25/2012)  . Bone marrow biopsy  1993; 1995  . Abdominal exploration surgery  1995    "found sluggish bone marrow" (04/25/2012)  . Parathyroid implant removal Left     removed parathyroid from neck and implanted in arm  . Colonoscopy N/A 08/30/2013    Procedure: COLONOSCOPY;  Surgeon: Winfield Cunas., MD;  Location: [facility];  Service: Endoscopy;  Laterality: N/A;   Family History  Problem Relation Age of Onset  . Heart disease Mother   . Hypertension      runs in family  . Sleep apnea Sister    Social History  Substance Use Topics  . Smoking status: Never Smoker   . Smokeless tobacco: Never Used  . Alcohol Use: No   OB History    No data available     Review of Systems  Constitutional: Positive for fever, chills, appetite change and fatigue.  HENT: Positive for congestion. Negative for sinus pressure and sore throat.   Respiratory: Negative for cough and  shortness of breath.   Cardiovascular: Negative for chest pain.  Gastrointestinal: Negative for nausea, vomiting, abdominal pain, diarrhea and constipation.  Genitourinary: Positive for frequency. Negative for dysuria, hematuria and flank pain.  Musculoskeletal: Negative for myalgias, back pain, arthralgias, neck pain and neck stiffness.  Skin: Negative for rash and wound.  Neurological: Positive for headaches. Negative for dizziness, weakness, light-headedness and numbness.  All other systems reviewed and are negative.     Allergies  Amoxicillin; Ampicillin; Ciprofloxacin; Erythromycin; Penicillins; Sulfa antibiotics; Sulfamethoxazole-trimethoprim; Tetracycline; and  Labetalol  Home Medications   Prior to Admission medications   Medication Sig Start Date End Date Taking? Authorizing Provider  acetaminophen (TYLENOL) 325 MG tablet Take 650 mg by mouth every 6 (six) hours as needed for mild pain.   Yes Historical Provider, MD  allopurinol (ZYLOPRIM) 100 MG tablet Take 100 mg by mouth 2 (two) times daily.  09/22/14  Yes Historical Provider, MD  calcitRIOL (ROCALTROL) 0.25 MCG capsule Take 0.25 mcg by mouth daily.    Yes Historical Provider, MD  epoetin alfa (EPOGEN,PROCRIT) 25053 UNIT/ML injection Inject 40,000 Units into the skin every 21 ( twenty-one) days.    Yes Historical Provider, MD  feeding supplement, ENSURE ENLIVE, (ENSURE ENLIVE) LIQD Take 237 mLs by mouth 2 (two) times daily between meals. 08/26/14  Yes Tasrif Ahmed, MD  gabapentin (NEURONTIN) 100 MG capsule Take 200 mg by mouth at bedtime.    Yes Historical Provider, MD  levothyroxine (SYNTHROID, LEVOTHROID) 100 MCG tablet Take 100 mcg by mouth daily.   Yes Historical Provider, MD  MAGNESIUM-OXIDE 400 (241.3 MG) MG tablet Take 400 mg by mouth 2 (two) times daily. 05/19/14  Yes Historical Provider, MD  metoprolol tartrate (LOPRESSOR) 25 MG tablet Take 1 tablet by mouth 2 (two) times daily. 09/18/14  Yes Historical Provider, MD  mycophenolate (MYFORTIC) 180 MG EC tablet Take 360 mg by mouth 2 (two) times daily.     Yes Historical Provider, MD  polyethylene glycol (MIRALAX / GLYCOLAX) packet Take 17 g by mouth 2 (two) times daily as needed. For constipation   Yes Historical Provider, MD  predniSONE (DELTASONE) 5 MG tablet Take 5 mg by mouth daily.    Yes Historical Provider, MD  tacrolimus (PROGRAF) 1 MG capsule Take 4 mg by mouth 2 (two) times daily.    Yes Historical Provider, MD  warfarin (COUMADIN) 2.5 MG tablet Take 2.5 mg by mouth daily.    Yes Historical Provider, MD  cefUROXime (CEFTIN) 500 MG tablet Take 1 tablet (500 mg total) by mouth 2 (two) times daily with a meal. 10/31/15   Julianne Rice,  MD  fluticasone [facility]) 50 MCG/ACT nasal spray Place 2 sprays into both nostrils daily. 09/25/15   Edward Saguier, PA-C   BP 113/65 mmHg  Pulse 92  Temp(Src) 99.6 F (37.6 C) (Oral)  Resp 18  Ht _0  (1.6 m)  Wt 135 lb (61.236 kg)  BMI 23.92 kg/m2  SpO2 96%  LMP 03/05/2014 Physical Exam  Constitutional: She is oriented to person, place, and time. She appears well-developed and well-nourished. No distress.  HENT:  Head: Normocephalic and atraumatic.  Mouth/Throat: Oropharynx is clear and moist. No oropharyngeal exudate.  Eyes: EOM are normal. Pupils are equal, round, and reactive to light.  Neck: Normal range of motion. Neck supple.  No meningismus  Cardiovascular: Normal rate and regular rhythm.  Exam reveals no gallop and no friction rub.   No murmur heard. Pulmonary/Chest: Effort normal and breath sounds normal. No respiratory distress.  She has no wheezes. She has no rales. She exhibits no tenderness.  Abdominal: Soft. Bowel sounds are normal. She exhibits no distension and no mass. There is no tenderness. There is no rebound and no guarding.  Musculoskeletal: Normal range of motion. She exhibits no edema or tenderness.  No CVA tenderness.   Neurological: She is alert and oriented to person, place, and time.  Moves all extremities without deficit. Sensation is fully intact.  Skin: Skin is warm and dry. No rash noted. No erythema.  Psychiatric: She has a normal mood and affect. Her behavior is normal.  Nursing note and vitals reviewed.   ED Course  Procedures (including critical care time) Labs Review Labs Reviewed  URINALYSIS, ROUTINE W REFLEX MICROSCOPIC (NOT AT [facility]) - Abnormal; Notable for the following:    Leukocytes, UA SMALL (*)    All other components within normal limits  CBC WITH DIFFERENTIAL/PLATELET - Abnormal; Notable for the following:    RBC 3.80 (*)    Hemoglobin 10.9 (*)    HCT 35.6 (*)    RDW 15.6 (*)    Platelets 101 (*)    Lymphs Abs 0.2 (*)     All other components within normal limits  COMPREHENSIVE METABOLIC PANEL - Abnormal; Notable for the following:    Sodium 133 (*)    CO2 21 (*)    BUN 49 (*)    Creatinine, Ser 2.29 (*)    Alkaline Phosphatase 162 (*)    GFR calc non Af Amer 20 (*)    GFR calc Af Amer 23 (*)    All other components within normal limits  PROTIME-INR - Abnormal; Notable for the following:    Prothrombin Time 20.0 (*)    INR 1.70 (*)    All other components within normal limits  URINE MICROSCOPIC-ADD ON - Abnormal; Notable for the following:    Squamous Epithelial / LPF 0-5 (*)    Bacteria, UA FEW (*)    All other components within normal limits  CULTURE, BLOOD (ROUTINE X 2)  CULTURE, BLOOD (ROUTINE X 2)    Imaging Review Dg Chest 2 View  10/31/2015  CLINICAL DATA:  Fever, cough, nausea, vomiting, diarrhea, nasal congestion and sore throat, BILATERAL ear pain, history hypertension, Crohn's disease, atrial fibrillation, renal cell carcinoma, lupus EXAM: CHEST  2 VIEW COMPARISON:  11/12/2014 FINDINGS: Enlargement of cardiac silhouette with pulmonary vascular congestion. Atherosclerotic calcification and tortuosity of thoracic aorta. Minimal subsegmental atelectasis versus scarring in the lungs. Calcified pleural plaque at RIGHT diaphragm. Question new nodular density in the mid LEFT lung 16 x 13 mm. No acute infiltrate, pleural effusion, or pneumothorax. Bones demineralized. IMPRESSION: Enlargement of cardiac silhouette with pulmonary vascular congestion. Question new 16 x 13 mm diameter a LEFT mid lung nodular density; CT chest recommended for further assessment. Aortic atherosclerosis. Electronically Signed   By: Lavonia Dana M.D.   On: 10/31/2015 11:48   Ct Chest Wo Contrast  10/31/2015  CLINICAL DATA:  Abnormal chest radiograph with questionable nodular density lateral mid LEFT lung EXAM: CT CHEST WITHOUT CONTRAST TECHNIQUE: Multidetector CT imaging of the chest was performed following the standard protocol  without IV contrast. Sagittal and coronal MPR images reconstructed from axial data set. COMPARISON:  Chest radiograph 10/31/2015 FINDINGS: Cardiovascular: Atherosclerotic calcifications aorta proximal great vessels, and coronary arteries. Mediastinum: No thoracic adenopathy. Minimal esophageal dilatation. Collateral vessels in the anterior chest wall through both breasts for the axillae. Visualized base of cervical region shows surgical clips from prior thyroid/parathyroid  surgery. Lungs/Pleura: Pleural thickening at LEFT major fissure roughly corresponding with site of radiographic opacity. No discrete pulmonary mass/nodule. Upper Abdomen: Question BILATERAL prior nephrectomies. Scattered atherosclerotic calcifications. Remaining visualized upper abdomen unremarkable. Musculoskeletal: Bones appear demineralized. Small bone islands in LEFT glenoid and LEFT fourth rib. Soft tissue calcifications at the shoulder joints appears to follow the course of the rotator cuff tendons question combination of calcific tendinitis and calcific debris/loose bodies. IMPRESSION: No evidence of pulmonary mass/nodule. Radiographic finding corresponds in position to an area of pleural thickening on CT. Electronically Signed   By: Lavonia Dana M.D.   On: 10/31/2015 14:20   I have personally reviewed and evaluated these images and lab results as part of my medical decision-making.   EKG Interpretation None      MDM   Final diagnoses:  Fever, unspecified fever cause  UTI (lower urinary tract infection)    Patient is very well-appearing. Questionable infiltrate on chest x-ray. CT chest without evidence of pneumonia or masses. Patient does have some white blood cells in her urine and small amount of bacteria. Concern for possible UTI. Given Rocephin in the emergency department since she's had this before without any reaction. We'll discharge with a course of Ceftin which she's had previously without reaction. She knows that  she is to follow-up with her primary physician on Monday. She understands the need to return immediately for any worsening symptoms or concerns.    Julianne Rice, MD 10/31/15 [phone]

## 2015-10-31 NOTE — ED Notes (Signed)
Vital signs update given to Dr. Lita Mains.

## 2015-10-31 NOTE — ED Notes (Signed)
Pt reports she developed a fever of 99.9 last night around 2130 (pt took Tylenol at that time). Reports this morning her temp was 101.9 and that she took Tylenol 1g around 0800. Pt denies cough, n/v/d, nasal congestion, sore throat. Reports bil ear pain while feverish. Reports urinary frequency; denies dysuria, urgency, hematuria.

## 2015-10-31 NOTE — ED Notes (Signed)
PT unable to void at this time. Pt reminded of the need for sample.

## 2015-11-02 LAB — URINE CULTURE

## 2015-11-04 ENCOUNTER — Other Ambulatory Visit (HOSPITAL_COMMUNITY): Payer: Self-pay | Admitting: *Deleted

## 2015-11-05 ENCOUNTER — Encounter (HOSPITAL_COMMUNITY)
Admission: RE | Admit: 2015-11-05 | Discharge: 2015-11-05 | Disposition: A | Discharge status: Home / Self Care | Payer: Medicare Other | Source: Ambulatory Visit | Attending: Nephrology | Admitting: Nephrology

## 2015-11-05 DIAGNOSIS — D631 Anemia in chronic kidney disease: Secondary | ICD-10-CM | POA: Diagnosis not present

## 2015-11-05 DIAGNOSIS — N183 Chronic kidney disease, stage 3 (moderate): Secondary | ICD-10-CM | POA: Diagnosis not present

## 2015-11-05 LAB — RENAL FUNCTION PANEL
Albumin: 3.5 g/dL (ref 3.5–5.0)
Anion gap: 7 (ref 5–15)
BUN: 46 mg/dL — ABNORMAL HIGH (ref 6–20)
CHLORIDE: 108 mmol/L (ref 101–111)
CO2: 21 mmol/L — AB (ref 22–32)
CREATININE: 2.02 mg/dL — AB (ref 0.44–1.00)
Calcium: 9.2 mg/dL (ref 8.9–10.3)
GFR calc non Af Amer: 23 mL/min — ABNORMAL LOW (ref >60)
GFR, EST AFRICAN AMERICAN: 27 mL/min — AB (ref >60)
Glucose, Bld: 87 mg/dL (ref 65–99)
POTASSIUM: 4.9 mmol/L (ref 3.5–5.1)
Phosphorus: 4 mg/dL (ref 2.5–4.6)
Sodium: 136 mmol/L (ref 135–145)

## 2015-11-05 LAB — CULTURE, BLOOD (ROUTINE X 2)
CULTURE: NO GROWTH
Culture: NO GROWTH

## 2015-11-05 LAB — IRON AND TIBC
Iron: 116 ug/dL (ref 28–170)
SATURATION RATIOS: 51 % — AB (ref 10.4–31.8)
TIBC: 230 ug/dL — AB (ref 250–450)
UIBC: 114 ug/dL

## 2015-11-05 LAB — PROTIME-INR
INR: 2 — AB (ref 0.00–1.49)
Prothrombin Time: 22.5 seconds — ABNORMAL HIGH (ref 11.6–15.2)

## 2015-11-05 LAB — POCT HEMOGLOBIN-HEMACUE: Hemoglobin: 10.3 g/dL — ABNORMAL LOW (ref 12.0–15.0)

## 2015-11-05 LAB — FERRITIN: FERRITIN: 738 ng/mL — AB (ref 11–307)

## 2015-11-05 MED ORDER — EPOETIN ALFA 10000 UNIT/ML IJ SOLN: 40000.0000 [IU] | INTRAMUSCULAR | Status: DC

## 2015-11-05 MED ORDER — EPOETIN ALFA 40000 UNIT/ML IJ SOLN
INTRAMUSCULAR | Status: AC
  Administered 2015-11-05: 40000 [IU] via SUBCUTANEOUS
  Filled 2015-11-05: qty 1

## 2015-11-13 DIAGNOSIS — B351 Tinea unguium: Secondary | ICD-10-CM | POA: Diagnosis not present

## 2015-11-13 DIAGNOSIS — L03032 Cellulitis of left toe: Secondary | ICD-10-CM | POA: Diagnosis not present

## 2015-11-26 ENCOUNTER — Encounter (HOSPITAL_COMMUNITY)
Admission: RE | Admit: 2015-11-26 | Discharge: 2015-11-26 | Disposition: A | Discharge status: Home / Self Care | Payer: Medicare Other | Source: Ambulatory Visit | Attending: Nephrology | Admitting: Nephrology

## 2015-11-26 DIAGNOSIS — D631 Anemia in chronic kidney disease: Secondary | ICD-10-CM | POA: Diagnosis not present

## 2015-11-26 DIAGNOSIS — N183 Chronic kidney disease, stage 3 (moderate): Secondary | ICD-10-CM | POA: Insufficient documentation

## 2015-11-26 LAB — POCT HEMOGLOBIN-HEMACUE: HEMOGLOBIN: 11.3 g/dL — AB (ref 12.0–15.0)

## 2015-11-26 MED ORDER — EPOETIN ALFA 40000 UNIT/ML IJ SOLN
INTRAMUSCULAR | Status: AC
  Filled 2015-11-26: qty 1

## 2015-11-26 MED ORDER — EPOETIN ALFA 10000 UNIT/ML IJ SOLN
40000.0000 [IU] | INTRAMUSCULAR | Status: DC
  Administered 2015-11-26: 40000 [IU] via SUBCUTANEOUS

## 2015-11-27 MED FILL — Epoetin Alfa Inj 40000 Unit/ML: INTRAMUSCULAR | Qty: 1 | Status: AC

## 2015-11-30 DIAGNOSIS — L03012 Cellulitis of left finger: Secondary | ICD-10-CM | POA: Diagnosis not present

## 2015-12-08 DIAGNOSIS — D631 Anemia in chronic kidney disease: Secondary | ICD-10-CM | POA: Diagnosis not present

## 2015-12-08 DIAGNOSIS — N184 Chronic kidney disease, stage 4 (severe): Secondary | ICD-10-CM | POA: Diagnosis not present

## 2015-12-08 DIAGNOSIS — M329 Systemic lupus erythematosus, unspecified: Secondary | ICD-10-CM | POA: Diagnosis not present

## 2015-12-08 DIAGNOSIS — I129 Hypertensive chronic kidney disease with stage 1 through stage 4 chronic kidney disease, or unspecified chronic kidney disease: Secondary | ICD-10-CM | POA: Diagnosis not present

## 2015-12-08 DIAGNOSIS — M109 Gout, unspecified: Secondary | ICD-10-CM | POA: Diagnosis not present

## 2015-12-08 DIAGNOSIS — I4891 Unspecified atrial fibrillation: Secondary | ICD-10-CM | POA: Diagnosis not present

## 2015-12-08 DIAGNOSIS — Z86718 Personal history of other venous thrombosis and embolism: Secondary | ICD-10-CM | POA: Diagnosis not present

## 2015-12-08 DIAGNOSIS — E782 Mixed hyperlipidemia: Secondary | ICD-10-CM | POA: Diagnosis not present

## 2015-12-08 DIAGNOSIS — Z7901 Long term (current) use of anticoagulants: Secondary | ICD-10-CM | POA: Diagnosis not present

## 2015-12-08 DIAGNOSIS — Z940 Kidney transplant status: Secondary | ICD-10-CM | POA: Diagnosis not present

## 2015-12-08 DIAGNOSIS — N2589 Other disorders resulting from impaired renal tubular function: Secondary | ICD-10-CM | POA: Diagnosis not present

## 2015-12-08 DIAGNOSIS — N2581 Secondary hyperparathyroidism of renal origin: Secondary | ICD-10-CM | POA: Diagnosis not present

## 2015-12-17 ENCOUNTER — Encounter (HOSPITAL_COMMUNITY)
Admission: RE | Admit: 2015-12-17 | Discharge: 2015-12-17 | Disposition: A | Discharge status: Home / Self Care | Payer: Medicare Other | Source: Ambulatory Visit | Attending: Nephrology | Admitting: Nephrology

## 2015-12-17 DIAGNOSIS — N183 Chronic kidney disease, stage 3 (moderate): Secondary | ICD-10-CM | POA: Insufficient documentation

## 2015-12-17 DIAGNOSIS — D631 Anemia in chronic kidney disease: Secondary | ICD-10-CM | POA: Insufficient documentation

## 2015-12-17 DIAGNOSIS — N184 Chronic kidney disease, stage 4 (severe): Secondary | ICD-10-CM

## 2015-12-17 LAB — IRON AND TIBC
IRON: 116 ug/dL (ref 28–170)
Saturation Ratios: 51 % — ABNORMAL HIGH (ref 10.4–31.8)
TIBC: 227 ug/dL — AB (ref 250–450)
UIBC: 111 ug/dL

## 2015-12-17 LAB — RENAL FUNCTION PANEL
ALBUMIN: 3.8 g/dL (ref 3.5–5.0)
ANION GAP: 7 (ref 5–15)
BUN: 39 mg/dL — ABNORMAL HIGH (ref 6–20)
CALCIUM: 9.2 mg/dL (ref 8.9–10.3)
CO2: 23 mmol/L (ref 22–32)
CREATININE: 2.19 mg/dL — AB (ref 0.44–1.00)
Chloride: 106 mmol/L (ref 101–111)
GFR, EST AFRICAN AMERICAN: 25 mL/min — AB (ref >60)
GFR, EST NON AFRICAN AMERICAN: 21 mL/min — AB (ref >60)
Glucose, Bld: 97 mg/dL (ref 65–99)
PHOSPHORUS: 4.1 mg/dL (ref 2.5–4.6)
Potassium: 4.4 mmol/L (ref 3.5–5.1)
SODIUM: 136 mmol/L (ref 135–145)

## 2015-12-17 LAB — POCT HEMOGLOBIN-HEMACUE: HEMOGLOBIN: 11.8 g/dL — AB (ref 12.0–15.0)

## 2015-12-17 LAB — FERRITIN: Ferritin: 619 ng/mL — ABNORMAL HIGH (ref 11–307)

## 2015-12-17 MED ORDER — EPOETIN ALFA 10000 UNIT/ML IJ SOLN: 40000.0000 [IU] | INTRAMUSCULAR | Status: DC

## 2015-12-17 MED ORDER — EPOETIN ALFA 40000 UNIT/ML IJ SOLN
INTRAMUSCULAR | Status: AC
  Administered 2015-12-17: 40000 [IU] via SUBCUTANEOUS
  Filled 2015-12-17: qty 1

## 2015-12-24 ENCOUNTER — Ambulatory Visit: Payer: BLUE CROSS/BLUE SHIELD | Admitting: Family Medicine

## 2016-01-01 LAB — PROTIME-INR: INR: 1.3 — AB (ref 0.9–1.1)

## 2016-01-06 ENCOUNTER — Other Ambulatory Visit (HOSPITAL_COMMUNITY): Payer: Self-pay | Admitting: *Deleted

## 2016-01-07 ENCOUNTER — Encounter (HOSPITAL_COMMUNITY)
Admission: RE | Admit: 2016-01-07 | Discharge: 2016-01-07 | Disposition: A | Discharge status: Home / Self Care | Payer: Medicare Other | Source: Ambulatory Visit | Attending: Nephrology | Admitting: Nephrology

## 2016-01-07 DIAGNOSIS — N183 Chronic kidney disease, stage 3 (moderate): Secondary | ICD-10-CM | POA: Diagnosis not present

## 2016-01-07 DIAGNOSIS — N184 Chronic kidney disease, stage 4 (severe): Secondary | ICD-10-CM

## 2016-01-07 DIAGNOSIS — D631 Anemia in chronic kidney disease: Secondary | ICD-10-CM | POA: Diagnosis not present

## 2016-01-07 LAB — POCT HEMOGLOBIN-HEMACUE: HEMOGLOBIN: 11.7 g/dL — AB (ref 12.0–15.0)

## 2016-01-07 MED ORDER — EPOETIN ALFA 10000 UNIT/ML IJ SOLN
40000.0000 [IU] | INTRAMUSCULAR | Status: DC
  Administered 2016-01-07: 40000 [IU] via SUBCUTANEOUS

## 2016-01-07 MED ORDER — EPOETIN ALFA 40000 UNIT/ML IJ SOLN
INTRAMUSCULAR | Status: AC
  Filled 2016-01-07: qty 1

## 2016-01-08 MED FILL — Epoetin Alfa Inj 40000 Unit/ML: INTRAMUSCULAR | Qty: 1 | Status: AC

## 2016-01-18 ENCOUNTER — Encounter: Payer: Self-pay | Admitting: Family Medicine

## 2016-01-18 ENCOUNTER — Ambulatory Visit (INDEPENDENT_AMBULATORY_CARE_PROVIDER_SITE_OTHER): Payer: Medicare Other | Admitting: Family Medicine

## 2016-01-18 VITALS — BP 142/68 | HR 73 | Temp 98.0°F | Resp 16 | Ht 63.0 in | Wt 137.6 lb

## 2016-01-18 DIAGNOSIS — E039 Hypothyroidism, unspecified: Secondary | ICD-10-CM

## 2016-01-18 DIAGNOSIS — N184 Chronic kidney disease, stage 4 (severe): Secondary | ICD-10-CM | POA: Diagnosis not present

## 2016-01-18 DIAGNOSIS — I10 Essential (primary) hypertension: Secondary | ICD-10-CM | POA: Diagnosis not present

## 2016-01-18 DIAGNOSIS — I83893 Varicose veins of bilateral lower extremities with other complications: Secondary | ICD-10-CM | POA: Diagnosis not present

## 2016-01-18 MED ORDER — NONFORMULARY OR COMPOUNDED ITEM: 0 refills | Status: DC

## 2016-01-18 NOTE — Patient Instructions (Signed)
Varicose Veins Varicose veins are veins that have become enlarged and twisted. They are usually seen in the legs but can occur in other parts of the body as well. CAUSES This condition is the result of valves in the veins not working properly. Valves in the veins help to return blood from the leg to the heart. If these valves are damaged, blood flows backward and backs up into the veins in the leg near the skin. This causes the veins to become larger. RISK FACTORS People who are on their feet a lot, who are pregnant, or who are overweight are more likely to develop varicose veins. SIGNS AND SYMPTOMS  Bulging, twisted-appearing, bluish veins, most commonly found on the legs.  Leg pain or a feeling of heaviness. These symptoms may be worse at the end of the day.  Leg swelling.  Changes in skin color. DIAGNOSIS A health care provider can usually diagnose varicose veins by examining your legs. Your health care provider may also recommend an ultrasound of your leg veins. TREATMENT Most varicose veins can be treated at home.However, other treatments are available for people who have persistent symptoms or want to improve the cosmetic appearance of the varicose veins. These treatment options include:  Sclerotherapy. A solution is injected into the vein to close it off.  Laser treatment. A laser is used to heat the vein to close it off.  Radiofrequency vein ablation. An electrical current produced by radio waves is used to close off the vein.  Phlebectomy. The vein is surgically removed through small incisions made over the varicose vein.  Vein ligation and stripping. The vein is surgically removed through incisions made over the varicose vein after the vein has been tied (ligated). HOME CARE INSTRUCTIONS  Do not stand or sit in one position for long periods of time. Do not sit with your legs crossed. Rest with your legs raised during the day.  Wear compression stockings as directed by your  health care provider. These stockings help to prevent blood clots and reduce swelling in your legs.  Do not wear other tight, encircling garments around your legs, pelvis, or waist.  Walk as much as possible to increase blood flow.  Raise the foot of your bed at night with 2-inch blocks.  If you get a cut in the skin over the vein and the vein bleeds, lie down with your leg raised and press on it with a clean cloth until the bleeding stops. Then place a bandage (dressing) on the cut. See your health care provider if it continues to bleed. SEEK MEDICAL CARE IF:  The skin around your ankle starts to break down.  You have pain, redness, tenderness, or hard swelling in your leg over a vein.  You are uncomfortable because of leg pain.   This information is not intended to replace advice given to you by your health care provider. Make sure you discuss any questions you have with your health care provider.   Document Released: 02/02/2005 Document Revised: 05/16/2014 Document Reviewed: 09/10/2013 Elsevier Interactive Patient Education [employer].

## 2016-01-18 NOTE — Progress Notes (Signed)
Pre visit review using our clinic review tool, if applicable. No additional management support is needed unless otherwise documented below in the visit note. 

## 2016-01-20 ENCOUNTER — Encounter: Payer: Self-pay | Admitting: Family Medicine

## 2016-01-20 MED ORDER — GABAPENTIN 100 MG PO CAPS: 200.0000 mg | ORAL_CAPSULE | Freq: Every day | ORAL | Status: DC

## 2016-01-20 MED ORDER — LEVOTHYROXINE SODIUM 100 MCG PO TABS: 100.0000 ug | ORAL_TABLET | Freq: Every day | ORAL | Status: DC

## 2016-01-20 MED ORDER — METOPROLOL TARTRATE 25 MG PO TABS: 25.0000 mg | ORAL_TABLET | Freq: Two times a day (BID) | ORAL | Status: DC

## 2016-01-20 NOTE — Progress Notes (Signed)
Patient ID: [name], female    DOB: 02/19/1941  Age: 75 y.o. MRN: [id number]    Subjective:  Subjective  HPI [name] presents for f/u bp and c/o varicose veins b/l --she is on her feet all day.  No calf pain.    Review of Systems  Constitutional: Negative for appetite change, diaphoresis, fatigue and unexpected weight change.  Eyes: Negative for pain, redness and visual disturbance.  Respiratory: Negative for cough, chest tightness, shortness of breath and wheezing.   Cardiovascular: Negative for chest pain, palpitations and leg swelling.  Endocrine: Negative for cold intolerance, heat intolerance, polydipsia, polyphagia and polyuria.  Genitourinary: Negative for difficulty urinating, dysuria and frequency.  Neurological: Negative for dizziness, light-headedness, numbness and headaches.    History Past Medical History:  Diagnosis Date  . Anemia    "when lupus flares up"  . Anxiety   . Arthritis    "in my joints"  . Atrial fibrillation ([place])   . CAP (community acquired pneumonia) 08/20/2014  . Crohn's disease ([place])   . DVT of leg (deep venous thrombosis) ([place]) 2012-8./13/2013   "have had one in each leg now"  . DVT of lower extremity, bilateral ([place])    "have had them in both legs; last one was on the RLE this year" (04/25/2012)  . GERD (gastroesophageal reflux disease)   . Gout 2016  . Hypertension   . Hypothyroidism   . Lower GI bleed 2012  . Numbness and tingling of foot    bilaterally  . OSA on CPAP   . Refusal of blood transfusion for reasons of conscience 12/20/2011   pt is Jehovah's Witness  . Renal cell carcinoma   . Renal disorder    S/P nephrectomy; dialysis; "working fine now" (12/20/11)  . SLE (systemic lupus erythematosus) ([place])     She has a past surgical history that includes Cesarean section (1984); Nephrectomy transplanted organ (2006); Colectomy (1998); Excisional hemorrhoidectomy; Cataract extraction w/ intraocular lens  implant,  bilateral (2005-2010); Vena cava filter placement (2012); Insertion of dialysis catheter (4098-1191); Cholecystectomy (1995); Colostomy (1998); Colostomy reversal (1999); Bone marrow biopsy (1993; 1995); Abdominal exploration surgery (1995); Parathyroid implant removal (Left); and Colonoscopy (N/A, 08/30/2013).   Her family history includes Heart disease in her mother; Sleep apnea in her sister.She reports that she has never smoked. She has never used smokeless tobacco. She reports that she does not drink alcohol or use drugs.  Current Outpatient Prescriptions on File Prior to Visit  Medication Sig Dispense Refill  . acetaminophen (TYLENOL) 325 MG tablet Take 650 mg by mouth every 6 (six) hours as needed for mild pain.    Marland Kitchen allopurinol (ZYLOPRIM) 100 MG tablet Take 100 mg by mouth 2 (two) times daily.     . calcitRIOL (ROCALTROL) 0.25 MCG capsule Take 0.25 mcg by mouth daily.     . cefUROXime (CEFTIN) 500 MG tablet Take 1 tablet (500 mg total) by mouth 2 (two) times daily with a meal. 14 tablet 0  . epoetin alfa (EPOGEN,PROCRIT) 47829 UNIT/ML injection Inject 40,000 Units into the skin every 21 ( twenty-one) days.     . feeding supplement, ENSURE ENLIVE, (ENSURE ENLIVE) LIQD Take 237 mLs by mouth 2 (two) times daily between meals. 60 Bottle 12  . fluticasone (FLONASE) 50 MCG/ACT nasal spray Place 2 sprays into both nostrils daily. 16 g 1  . MAGNESIUM-OXIDE 400 (241.3 MG) MG tablet Take 400 mg by mouth 2 (two) times daily.  0  . mycophenolate (MYFORTIC)  180 MG EC tablet Take 360 mg by mouth 2 (two) times daily.      . polyethylene glycol (MIRALAX / GLYCOLAX) packet Take 17 g by mouth 2 (two) times daily as needed. For constipation    . predniSONE (DELTASONE) 5 MG tablet Take 5 mg by mouth daily.     . tacrolimus (PROGRAF) 1 MG capsule Take 4 mg by mouth 2 (two) times daily.     Marland Kitchen warfarin (COUMADIN) 2.5 MG tablet Take 2.5 mg by mouth daily.      No current facility-administered medications on file  prior to visit.      Objective:  Objective  Physical Exam  Constitutional: She is oriented to person, place, and time. She appears well-developed and well-nourished.  HENT:  Head: Normocephalic and atraumatic.  Eyes: Conjunctivae and EOM are normal.  Neck: Normal range of motion. Neck supple. No JVD present. Carotid bruit is not present. No thyromegaly present.  Cardiovascular: Normal rate, regular rhythm and normal heart sounds.   No murmur heard. Pulmonary/Chest: Effort normal and breath sounds normal. No respiratory distress. She has no wheezes. She has no rales. She exhibits no tenderness.  Musculoskeletal: She exhibits no edema.  Neurological: She is alert and oriented to person, place, and time.  Skin:     Psychiatric: She has a normal mood and affect.  Nursing note and vitals reviewed.  BP (!) 142/68 (BP Location: Right Arm, Patient Position: Sitting, Cuff Size: Normal)   Pulse 73   Temp 98 F (36.7 C) (Oral)   Resp 16   Ht _0  (1.6 m)   Wt 137 lb 9.6 oz (62.4 kg)   LMP 03/05/2014   SpO2 95%   BMI 24.37 kg/m  Wt Readings from Last 3 Encounters:  01/18/16 137 lb 9.6 oz (62.4 kg)  10/31/15 135 lb (61.2 kg)  09/30/15 139 lb 12.8 oz (63.4 kg)     Lab Results  Component Value Date   WBC 6.8 10/31/2015   HGB 11.7 (L) 01/07/2016   HCT 35.6 (L) 10/31/2015   PLT 101 (L) 10/31/2015   GLUCOSE 97 12/17/2015   CHOL  07/19/2008    124        ATP III CLASSIFICATION:  <200     mg/dL   Desirable  200-239  mg/dL   Borderline High  >=240    mg/dL   High          TRIG 89 07/19/2008   HDL 34 (L) 07/19/2008   LDLCALC  07/19/2008    72        Total Cholesterol/HDL:CHD Risk Coronary Heart Disease Risk Table                     Men   Women  1/2 Average Risk   3.4   3.3  Average Risk       5.0   4.4  2 X Average Risk   9.6   7.1  3 X Average Risk  23.4   11.0        Use the calculated Patient Ratio above and the CHD Risk Table to determine the patient's CHD Risk.         ATP III CLASSIFICATION (LDL):  <100     mg/dL   Optimal  100-129  mg/dL   Near or Above                    Optimal  130-159  mg/dL  Borderline  160-189  mg/dL   High  >190     mg/dL   Very High   ALT 16 10/31/2015   AST 23 10/31/2015   NA 136 12/17/2015   K 4.4 12/17/2015   CL 106 12/17/2015   CREATININE 2.19 (H) 12/17/2015   BUN 39 (H) 12/17/2015   CO2 23 12/17/2015   TSH 0.706 11/12/2014   INR 2.00 (H) 11/05/2015   HGBA1C 6.5 (H) 11/12/2014    No results found.   Assessment & Plan:  Plan  I have changed [name] metoprolol tartrate, levothyroxine, and gabapentin. I am also having her start on NONFORMULARY OR COMPOUNDED ITEM and NONFORMULARY OR COMPOUNDED ITEM. Additionally, I am having her maintain her calcitRIOL, polyethylene glycol, mycophenolate, predniSONE, epoetin alfa, tacrolimus, acetaminophen, MAGNESIUM-OXIDE, feeding supplement (ENSURE ENLIVE), allopurinol, warfarin, fluticasone, and cefUROXime.  Meds ordered this encounter  Medications  . NONFORMULARY OR COMPOUNDED ITEM    Sig: Lipid , TSH----  DX hypothyroid, chronic kidney disease, stage 4    Dispense:  1 each    Refill:  0  . NONFORMULARY OR COMPOUNDED ITEM    Sig: Compression hose-- thigh high  20-30 mm hg  #1 as directed Dx-- varicose veins b/l with pain    Dispense:  1 each    Refill:  0  . metoprolol tartrate (LOPRESSOR) 25 MG tablet    Sig: Take 1 tablet (25 mg total) by mouth 2 (two) times daily.  Marland Kitchen levothyroxine (SYNTHROID, LEVOTHROID) 100 MCG tablet    Sig: Take 1 tablet (100 mcg total) by mouth daily.  Marland Kitchen gabapentin (NEURONTIN) 100 MG capsule    Sig: Take 2 capsules (200 mg total) by mouth at bedtime.    Problem List Items Addressed This Visit      Unprioritized   Hypothyroidism   Relevant Medications   NONFORMULARY OR COMPOUNDED ITEM   metoprolol tartrate (LOPRESSOR) 25 MG tablet   levothyroxine (SYNTHROID, LEVOTHROID) 100 MCG tablet   HTN (hypertension) (Chronic)   Relevant  Medications   metoprolol tartrate (LOPRESSOR) 25 MG tablet    Other Visit Diagnoses    Varicose veins of bilateral lower extremities with other complications    -  Primary   Relevant Medications   NONFORMULARY OR COMPOUNDED ITEM   Chronic kidney disease (CKD), stage IV (severe) (HCC)       Relevant Medications   NONFORMULARY OR COMPOUNDED ITEM      Follow-up: Return in about 6 months (around 07/17/2016) for annual exam, fasting.  Ann Held, DO

## 2016-01-28 ENCOUNTER — Encounter (HOSPITAL_COMMUNITY)
Admission: RE | Admit: 2016-01-28 | Discharge: 2016-01-28 | Disposition: A | Discharge status: Home / Self Care | Payer: Medicare Other | Source: Ambulatory Visit | Attending: Nephrology | Admitting: Nephrology

## 2016-01-28 DIAGNOSIS — D631 Anemia in chronic kidney disease: Secondary | ICD-10-CM | POA: Diagnosis not present

## 2016-01-28 DIAGNOSIS — N183 Chronic kidney disease, stage 3 (moderate): Secondary | ICD-10-CM | POA: Diagnosis not present

## 2016-01-28 DIAGNOSIS — N184 Chronic kidney disease, stage 4 (severe): Secondary | ICD-10-CM

## 2016-01-28 LAB — IRON AND TIBC
IRON: 113 ug/dL (ref 28–170)
Saturation Ratios: 55 % — ABNORMAL HIGH (ref 10.4–31.8)
TIBC: 206 ug/dL — ABNORMAL LOW (ref 250–450)
UIBC: 93 ug/dL

## 2016-01-28 LAB — RENAL FUNCTION PANEL
ALBUMIN: 3.5 g/dL (ref 3.5–5.0)
Anion gap: 9 (ref 5–15)
BUN: 36 mg/dL — AB (ref 6–20)
CALCIUM: 9.3 mg/dL (ref 8.9–10.3)
CO2: 23 mmol/L (ref 22–32)
CREATININE: 1.7 mg/dL — AB (ref 0.44–1.00)
Chloride: 106 mmol/L (ref 101–111)
GFR calc Af Amer: 34 mL/min — ABNORMAL LOW (ref >60)
GFR, EST NON AFRICAN AMERICAN: 29 mL/min — AB (ref >60)
GLUCOSE: 86 mg/dL (ref 65–99)
PHOSPHORUS: 3.7 mg/dL (ref 2.5–4.6)
POTASSIUM: 4.1 mmol/L (ref 3.5–5.1)
SODIUM: 138 mmol/L (ref 135–145)

## 2016-01-28 LAB — FERRITIN: Ferritin: 589 ng/mL — ABNORMAL HIGH (ref 11–307)

## 2016-01-28 LAB — POCT HEMOGLOBIN-HEMACUE: Hemoglobin: 12.4 g/dL (ref 12.0–15.0)

## 2016-01-28 MED ORDER — EPOETIN ALFA 10000 UNIT/ML IJ SOLN: 40000.0000 [IU] | INTRAMUSCULAR | Status: DC

## 2016-02-11 ENCOUNTER — Encounter (HOSPITAL_COMMUNITY)
Admission: RE | Admit: 2016-02-11 | Discharge: 2016-02-11 | Disposition: A | Discharge status: Home / Self Care | Payer: Medicare Other | Source: Ambulatory Visit | Attending: Nephrology | Admitting: Nephrology

## 2016-02-11 DIAGNOSIS — N183 Chronic kidney disease, stage 3 (moderate): Secondary | ICD-10-CM | POA: Diagnosis not present

## 2016-02-11 DIAGNOSIS — N184 Chronic kidney disease, stage 4 (severe): Secondary | ICD-10-CM

## 2016-02-11 DIAGNOSIS — D631 Anemia in chronic kidney disease: Secondary | ICD-10-CM | POA: Insufficient documentation

## 2016-02-11 LAB — POCT HEMOGLOBIN-HEMACUE: HEMOGLOBIN: 12.3 g/dL (ref 12.0–15.0)

## 2016-02-11 MED ORDER — EPOETIN ALFA 10000 UNIT/ML IJ SOLN: 40000.0000 [IU] | INTRAMUSCULAR | Status: DC

## 2016-02-25 ENCOUNTER — Encounter (HOSPITAL_COMMUNITY)
Admission: RE | Admit: 2016-02-25 | Discharge: 2016-02-25 | Disposition: A | Discharge status: Home / Self Care | Payer: Medicare Other | Source: Ambulatory Visit | Attending: Nephrology | Admitting: Nephrology

## 2016-02-25 DIAGNOSIS — D631 Anemia in chronic kidney disease: Secondary | ICD-10-CM | POA: Diagnosis not present

## 2016-02-25 DIAGNOSIS — N184 Chronic kidney disease, stage 4 (severe): Secondary | ICD-10-CM

## 2016-02-25 DIAGNOSIS — N183 Chronic kidney disease, stage 3 (moderate): Secondary | ICD-10-CM | POA: Diagnosis not present

## 2016-02-25 LAB — RENAL FUNCTION PANEL
Albumin: 3.8 g/dL (ref 3.5–5.0)
Anion gap: 9 (ref 5–15)
BUN: 45 mg/dL — ABNORMAL HIGH (ref 6–20)
CALCIUM: 9.3 mg/dL (ref 8.9–10.3)
CO2: 23 mmol/L (ref 22–32)
CREATININE: 2.06 mg/dL — AB (ref 0.44–1.00)
Chloride: 106 mmol/L (ref 101–111)
GFR calc Af Amer: 27 mL/min — ABNORMAL LOW (ref >60)
GFR calc non Af Amer: 23 mL/min — ABNORMAL LOW (ref >60)
GLUCOSE: 100 mg/dL — AB (ref 65–99)
Phosphorus: 3.9 mg/dL (ref 2.5–4.6)
Potassium: 5.3 mmol/L — ABNORMAL HIGH (ref 3.5–5.1)
SODIUM: 138 mmol/L (ref 135–145)

## 2016-02-25 LAB — FERRITIN: Ferritin: 860 ng/mL — ABNORMAL HIGH (ref 11–307)

## 2016-02-25 LAB — IRON AND TIBC
Iron: 95 ug/dL (ref 28–170)
SATURATION RATIOS: 45 % — AB (ref 10.4–31.8)
TIBC: 211 ug/dL — ABNORMAL LOW (ref 250–450)
UIBC: 116 ug/dL

## 2016-02-25 MED ORDER — EPOETIN ALFA 10000 UNIT/ML IJ SOLN
40000.0000 [IU] | INTRAMUSCULAR | Status: DC
  Administered 2016-02-25: 40000 [IU] via SUBCUTANEOUS

## 2016-02-25 MED ORDER — EPOETIN ALFA 40000 UNIT/ML IJ SOLN
INTRAMUSCULAR | Status: AC
  Filled 2016-02-25: qty 1

## 2016-02-26 LAB — POCT HEMOGLOBIN-HEMACUE: Hemoglobin: 11.2 g/dL — ABNORMAL LOW (ref 12.0–15.0)

## 2016-02-26 MED FILL — Epoetin Alfa Inj 40000 Unit/ML: INTRAMUSCULAR | Qty: 1 | Status: AC

## 2016-03-03 ENCOUNTER — Telehealth: Payer: Self-pay | Admitting: Family Medicine

## 2016-03-03 ENCOUNTER — Ambulatory Visit (HOSPITAL_BASED_OUTPATIENT_CLINIC_OR_DEPARTMENT_OTHER)
Admission: RE | Admit: 2016-03-03 | Discharge: 2016-03-03 | Disposition: A | Discharge status: Home / Self Care | Payer: Medicare Other | Source: Ambulatory Visit | Attending: Medical | Admitting: Medical

## 2016-03-03 ENCOUNTER — Encounter: Payer: Self-pay | Admitting: Medical

## 2016-03-03 ENCOUNTER — Other Ambulatory Visit: Payer: Self-pay | Admitting: Medical

## 2016-03-03 ENCOUNTER — Ambulatory Visit: Payer: Self-pay | Admitting: Medical

## 2016-03-03 ENCOUNTER — Ambulatory Visit (INDEPENDENT_AMBULATORY_CARE_PROVIDER_SITE_OTHER): Payer: Medicare Other | Admitting: Medical

## 2016-03-03 VITALS — BP 130/68 | HR 82 | Temp 98.0°F | Ht 63.0 in | Wt 135.6 lb

## 2016-03-03 DIAGNOSIS — M25551 Pain in right hip: Secondary | ICD-10-CM | POA: Diagnosis not present

## 2016-03-03 DIAGNOSIS — R1031 Right lower quadrant pain: Secondary | ICD-10-CM

## 2016-03-03 DIAGNOSIS — H8111 Benign paroxysmal vertigo, right ear: Secondary | ICD-10-CM | POA: Diagnosis not present

## 2016-03-03 DIAGNOSIS — M160 Bilateral primary osteoarthritis of hip: Secondary | ICD-10-CM | POA: Insufficient documentation

## 2016-03-03 DIAGNOSIS — M1611 Unilateral primary osteoarthritis, right hip: Secondary | ICD-10-CM | POA: Diagnosis not present

## 2016-03-03 MED ORDER — HYDROCODONE-ACETAMINOPHEN 5-325 MG PO TABS: 1.0000 | ORAL_TABLET | Freq: Four times a day (QID) | ORAL | 0 refills | Status: DC | PRN

## 2016-03-03 NOTE — Telephone Encounter (Signed)
[facility] - Client [facility]     Patient Name: [name] Initial Comment Caller states, the patient is having groin pain that cause her issues walking.   DOB: 06/07/1940      Nurse Assessment  Nurse: Luther Parody, RN, Malachy Mood Date/Time (Eastern Time): 03/03/2016 1:44:50 PM  Confirm and document reason for call. If symptomatic, describe symptoms. You must click the next button to save text entered. ---Caller states that she woke this am with right groin and is having difficulty ambulating. Denies swelling or injury.  Has the patient traveled out of the country within the last 30 days? ---Not Applicable  Does the patient have any new or worsening symptoms? ---Yes  Will a triage be completed? ---Yes  Related visit to physician within the last 2 weeks? ---No  Does the PT have any chronic conditions? (i.e. diabetes, asthma, etc.) ---Yes  List chronic conditions. ---kidney transplant, Lupus, htn, Crohn's  Is this a behavioral health or substance abuse call? ---No    Guidelines     Guideline Title Affirmed Question Affirmed Notes   Leg Pain [1] Thigh or calf pain AND [2] only 1 side AND [3] present > 1 hour    Final Disposition User   See Physician within 4 Hours (or PCP triage) Luther Parody, RN, Cheryl     Referrals   REFERRED TO PCP OFFICE   Disagree/Comply: Leta Baptist

## 2016-03-03 NOTE — Telephone Encounter (Signed)
Patient called with groin pain that is so bad she is unable to walk. Sent to [facility].

## 2016-03-03 NOTE — Progress Notes (Signed)
Pre visit review using our clinic tool,if applicable. No additional management support is needed unless otherwise documented below in the visit note.  

## 2016-03-03 NOTE — Progress Notes (Signed)
Subjective:    Patient ID: [name], female    DOB: [birth year], 75 y.o.   MRN: [id number]  Groin Pain  Pertinent negatives include no abdominal pain, chills, constipation, diarrhea, fever, headaches, nausea, sore throat or vomiting.  Dizziness  Pertinent negatives include no abdominal pain, chest pain, chills, congestion, coughing, diaphoresis, fatigue, fever, headaches, nausea, numbness, sore throat, vomiting or weakness.    Pt in with some rt groin area pain that started today. Pain is worse when she walks. Some pain rt her rt leg area as well. Pain is worse with walking and standing. No pain in rt lower quadrant Pt states baseline low appetite baseline. No fevers. No chills and no sweats. No nausea, no vomiting. No back pain and not uti signs or symptoms.    Pt had some dizziness earlier in the day as well. Describes transient brief second fo light headed. Abcense of any gross motor or sensory function deficits. Also some mild vertigo one time brief as well.  Review of Systems  Constitutional: Negative for chills, diaphoresis, fatigue and fever.  HENT: Negative for congestion, ear pain and sore throat.   Respiratory: Negative for cough and wheezing.   Cardiovascular: Negative for chest pain and palpitations.  Gastrointestinal: Negative for abdominal pain, constipation, diarrhea, nausea and vomiting.  Musculoskeletal:       Rt groin and hip pain.  Neurological: Positive for dizziness. Negative for tremors, seizures, syncope, facial asymmetry, speech difficulty, weakness, numbness and headaches.  Hematological: Negative for adenopathy. Does not bruise/bleed easily.  Psychiatric/Behavioral: Negative for agitation, behavioral problems, confusion, dysphoric mood, sleep disturbance and suicidal ideas.   Past Medical History:  Diagnosis Date  . Anemia    "when lupus flares up"  . Anxiety   . Arthritis    "in my joints"  . Atrial fibrillation ([place])   . CAP (community acquired  pneumonia) 08/20/2014  . Crohn's disease ([place])   . DVT of leg (deep venous thrombosis) ([place]) 2012-8./13/2013   "have had one in each leg now"  . DVT of lower extremity, bilateral ([place])    "have had them in both legs; last one was on the RLE this year" (04/25/2012)  . GERD (gastroesophageal reflux disease)   . Gout 2016  . Hypertension   . Hypothyroidism   . Lower GI bleed 2012  . Numbness and tingling of foot    bilaterally  . OSA on CPAP   . Refusal of blood transfusion for reasons of conscience 12/20/2011   pt is Jehovah's Witness  . Renal cell carcinoma   . Renal disorder    S/P nephrectomy; dialysis; "working fine now" (12/20/11)  . SLE (systemic lupus erythematosus) ([place])      Social History   Social History  . Marital status: Married    Spouse name: N/A  . Number of children: 3  . Years of education: N/A   Occupational History  . retired-bank teller Retired   Social History Main Topics  . Smoking status: Never Smoker  . Smokeless tobacco: Never Used  . Alcohol use No  . Drug use: No  . Sexual activity: Yes    Birth control/ protection: Post-menopausal   Other Topics Concern  . Not on file   Social History Narrative  . No narrative on file    Past Surgical History:  Procedure Laterality Date  . [place]   "found sluggish bone marrow" (04/25/2012)  . BONE MARROW BIOPSY  1993; 1995  . CATARACT  EXTRACTION W/ INTRAOCULAR LENS  IMPLANT, BILATERAL  2005-2010  . CESAREAN SECTION  1984  . CHOLECYSTECTOMY  1995  . COLECTOMY  1998   "removal of abscess" (04/25/2012)  . COLONOSCOPY N/A 08/30/2013   Procedure: COLONOSCOPY;  Surgeon: Winfield Cunas., MD;  Location: [facility];  Service: Endoscopy;  Laterality: N/A;  . COLOSTOMY  1998  . COLOSTOMY REVERSAL  1999   "6 months after placed" (04/25/2012)  . EXCISIONAL HEMORRHOIDECTOMY    . INSERTION OF DIALYSIS CATHETER  1988-2006   "peritoneal and hemodialysis; had multiple grafts and  fistulas; last graft clotted off 2012"  . NEPHRECTOMY TRANSPLANTED ORGAN  2006   bilaterally  . PARATHYROID IMPLANT REMOVAL Left    removed parathyroid from neck and implanted in arm  . VENA CAVA FILTER PLACEMENT  2012    Family History  Problem Relation Age of Onset  . Heart disease Mother   . Hypertension      runs in family  . Sleep apnea Sister     Allergies  Allergen Reactions  . Amoxicillin Anaphylaxis  . Ampicillin Anaphylaxis  . Ciprofloxacin Swelling    "of my throat"  . Erythromycin Anaphylaxis  . Penicillins Anaphylaxis  . Sulfa Antibiotics Itching  . Sulfamethoxazole-Trimethoprim Itching  . Tetracycline Anaphylaxis  . Labetalol Nausea And Vomiting    Current Outpatient Prescriptions on File Prior to Visit  Medication Sig Dispense Refill  . acetaminophen (TYLENOL) 325 MG tablet Take 650 mg by mouth every 6 (six) hours as needed for mild pain.    Marland Kitchen allopurinol (ZYLOPRIM) 100 MG tablet Take 100 mg by mouth 2 (two) times daily.     . calcitRIOL (ROCALTROL) 0.25 MCG capsule Take 0.25 mcg by mouth daily.     . cefUROXime (CEFTIN) 500 MG tablet Take 1 tablet (500 mg total) by mouth 2 (two) times daily with a meal. 14 tablet 0  . epoetin alfa (EPOGEN,PROCRIT) 00867 UNIT/ML injection Inject 40,000 Units into the skin every 21 ( twenty-one) days.     . feeding supplement, ENSURE ENLIVE, (ENSURE ENLIVE) LIQD Take 237 mLs by mouth 2 (two) times daily between meals. 60 Bottle 12  . fluticasone (FLONASE) 50 MCG/ACT nasal spray Place 2 sprays into both nostrils daily. 16 g 1  . gabapentin (NEURONTIN) 100 MG capsule Take 2 capsules (200 mg total) by mouth at bedtime.    Marland Kitchen levothyroxine (SYNTHROID, LEVOTHROID) 100 MCG tablet Take 1 tablet (100 mcg total) by mouth daily.    Marland Kitchen MAGNESIUM-OXIDE 400 (241.3 MG) MG tablet Take 400 mg by mouth 2 (two) times daily.  0  . metoprolol tartrate (LOPRESSOR) 25 MG tablet Take 1 tablet (25 mg total) by mouth 2 (two) times daily.    .  mycophenolate (MYFORTIC) 180 MG EC tablet Take 360 mg by mouth 2 (two) times daily.      . NONFORMULARY OR COMPOUNDED ITEM Lipid , TSH----  DX hypothyroid, chronic kidney disease, stage 4 1 each 0  . NONFORMULARY OR COMPOUNDED ITEM Compression hose-- thigh high  20-30 mm hg  #1 as directed Dx-- varicose veins b/l with pain 1 each 0  . polyethylene glycol (MIRALAX / GLYCOLAX) packet Take 17 g by mouth 2 (two) times daily as needed. For constipation    . predniSONE (DELTASONE) 5 MG tablet Take 5 mg by mouth daily.     . tacrolimus (PROGRAF) 1 MG capsule Take 4 mg by mouth 2 (two) times daily.     Marland Kitchen warfarin (COUMADIN) 2.5 MG  tablet Take 2.5 mg by mouth daily.      No current facility-administered medications on file prior to visit.     BP 130/68   Pulse 82   Temp 98 F (36.7 C) (Oral)   Ht _0  (1.6 m)   Wt 135 lb 9.6 oz (61.5 kg)   LMP 03/05/2014   SpO2 95%   BMI 24.02 kg/m   Objective:   Physical Exam  General Appearance- Not in acute distress.  HEENT Eyes- Scleraeral/Conjuntiva-bilat- Not Yellow. Mouth & Throat- Normal.  Chest and Lung Exam Auscultation: Breath sounds:-Normal. Adventitious sounds:- No Adventitious sounds.  Cardiovascular Auscultation:Rythm - Regular. Heart Sounds -Normal heart sounds.  Abdomen Inspection:-Inspection Normal.  Palpation/Perucssion: Palpation and Percussion of the abdomen reveal- Non Tender, No Rebound tenderness, No rigidity(Guarding) and No Palpable abdominal masses.  Liver:-Normal.  Spleen:- Normal.   General Mental Status- Alert. General Appearance- Not in acute distress.   Skin General: Color- Normal Color. Moisture- Normal Moisture.  Neck Carotid Arteries- Normal color. Moisture- Normal Moisture. No carotid bruits. No JVD.  Chest and Lung Exam Auscultation: Breath Sounds:-Normal.  Cardiovascular Auscultation:Rythm- Regular. Murmurs & Other Heart Sounds:Auscultation of the heart reveals- No  Murmurs.  Abdomen Inspection:-Inspeection Normal. Palpation/Percussion:Note:No mass. Palpation and Percussion of the abdomen reveal- Non Tender(no rt lower quadrant pain), Non Distended + BS, no rebound or guarding.   Neurologic Cranial Nerve exam:- CN III-XII intact(No nystagmus), symmetric smile. Drift Test:- No drift. .Finger to Nose:- Normal/Intact Strength:- 5/5 equal and symmetric strength both upper and lower extremities. Lying supine and turning head to left caused vertigo for about 10 seconds then subsided quickly.  Back-no cva tenderness.  Rt hip- mild pain on rom. Pain mostly in groin. On palpation groin region no mass or hernia felt.     Assessment & Plan:  For your rt hip/groin area will rx norco low dose and low number. Use sparingly. When you run out of norco then use tylenol. Get xray today. If pain worsens or changes notify us.  Note nsaid not good option with her low gfr. Pt states prior norco use and can tolerate.  For vertigo can refer you to PT if this persists(notify me by next week). If you have dizziness or vertigo with associated neuro signs or symptoms as discussed then ED evaluation.  If you dizziness or vertigo lasting for more than 5 minutes can use meclizine. But if short duration would not use the meclizine.   Follow up in 6-7 days or as needed.   Jerome Otter, Percell Miller, PA-C

## 2016-03-03 NOTE — Patient Instructions (Signed)
For your rt hip/groin area will rx norco low dose and low number. Use sparingly. When you run out of norco then use tylenol. Get xray today. If pain worsens or changes notify us.  For vertigo can refer you to PT if this persists(notify me by next week). If you have dizziness or vertigo with associated neuro signs or symptoms as discussed then ED evaluation.  If you dizziness or vertigo lasting for more than 5 minutes can use meclizine. But if short duration would not use the meclizine.   Follow up in 6-7 days or as needed.

## 2016-03-03 NOTE — Telephone Encounter (Signed)
[facility] - Client [facility]     Patient Name: [name] Initial Comment Caller states, the patient is having groin pain that cause her issues walking.   DOB: 01-Aug-1940      Nurse Assessment  Nurse: Luther Parody, RN, Malachy Mood Date/Time (Eastern Time): 03/03/2016 1:44:50 PM  Confirm and document reason for call. If symptomatic, describe symptoms. You must click the next button to save text entered. ---Caller states that she woke this am with right groin and is having difficulty ambulating. Denies swelling or injury.  Has the patient traveled out of the country within the last 30 days? ---Not Applicable  Does the patient have any new or worsening symptoms? ---Yes  Will a triage be completed? ---Yes  Related visit to physician within the last 2 weeks? ---No  Does the PT have any chronic conditions? (i.e. diabetes, asthma, etc.) ---Yes  List chronic conditions. ---kidney transplant, Lupus, htn, Crohn's  Is this a behavioral health or substance abuse call? ---No    Guidelines     Guideline Title Affirmed Question Affirmed Notes   Leg Pain [1] Thigh or calf pain AND [2] only 1 side AND [3] present > 1 hour    Final Disposition User   See Physician within 4 Hours (or PCP triage) Luther Parody, RN, Cheryl     Referrals   REFERRED TO PCP OFFICE   Disagree/Comply: Leta Baptist

## 2016-03-08 DIAGNOSIS — M109 Gout, unspecified: Secondary | ICD-10-CM | POA: Diagnosis not present

## 2016-03-08 DIAGNOSIS — Z7901 Long term (current) use of anticoagulants: Secondary | ICD-10-CM | POA: Diagnosis not present

## 2016-03-08 DIAGNOSIS — N390 Urinary tract infection, site not specified: Secondary | ICD-10-CM | POA: Diagnosis not present

## 2016-03-08 DIAGNOSIS — N2589 Other disorders resulting from impaired renal tubular function: Secondary | ICD-10-CM | POA: Diagnosis not present

## 2016-03-08 DIAGNOSIS — Z940 Kidney transplant status: Secondary | ICD-10-CM | POA: Diagnosis not present

## 2016-03-08 DIAGNOSIS — N2581 Secondary hyperparathyroidism of renal origin: Secondary | ICD-10-CM | POA: Diagnosis not present

## 2016-03-08 DIAGNOSIS — Z86718 Personal history of other venous thrombosis and embolism: Secondary | ICD-10-CM | POA: Diagnosis not present

## 2016-03-08 DIAGNOSIS — M329 Systemic lupus erythematosus, unspecified: Secondary | ICD-10-CM | POA: Diagnosis not present

## 2016-03-08 DIAGNOSIS — D631 Anemia in chronic kidney disease: Secondary | ICD-10-CM | POA: Diagnosis not present

## 2016-03-08 DIAGNOSIS — Z23 Encounter for immunization: Secondary | ICD-10-CM | POA: Diagnosis not present

## 2016-03-08 DIAGNOSIS — E039 Hypothyroidism, unspecified: Secondary | ICD-10-CM | POA: Diagnosis not present

## 2016-03-08 DIAGNOSIS — N184 Chronic kidney disease, stage 4 (severe): Secondary | ICD-10-CM | POA: Diagnosis not present

## 2016-03-08 DIAGNOSIS — I4891 Unspecified atrial fibrillation: Secondary | ICD-10-CM | POA: Diagnosis not present

## 2016-03-08 DIAGNOSIS — I129 Hypertensive chronic kidney disease with stage 1 through stage 4 chronic kidney disease, or unspecified chronic kidney disease: Secondary | ICD-10-CM | POA: Diagnosis not present

## 2016-03-17 ENCOUNTER — Encounter (HOSPITAL_COMMUNITY)
Admission: RE | Admit: 2016-03-17 | Discharge: 2016-03-17 | Disposition: A | Discharge status: Home / Self Care | Payer: Medicare Other | Source: Ambulatory Visit | Attending: Nephrology | Admitting: Nephrology

## 2016-03-17 DIAGNOSIS — N183 Chronic kidney disease, stage 3 (moderate): Secondary | ICD-10-CM | POA: Diagnosis not present

## 2016-03-17 DIAGNOSIS — N184 Chronic kidney disease, stage 4 (severe): Secondary | ICD-10-CM

## 2016-03-17 DIAGNOSIS — D631 Anemia in chronic kidney disease: Secondary | ICD-10-CM | POA: Diagnosis not present

## 2016-03-17 LAB — POCT HEMOGLOBIN-HEMACUE: HEMOGLOBIN: 11.2 g/dL — AB (ref 12.0–15.0)

## 2016-03-17 MED ORDER — EPOETIN ALFA 10000 UNIT/ML IJ SOLN: 40000.0000 [IU] | INTRAMUSCULAR | Status: DC

## 2016-03-17 MED ORDER — EPOETIN ALFA 40000 UNIT/ML IJ SOLN
INTRAMUSCULAR | Status: AC
  Administered 2016-03-17: 40000 [IU] via SUBCUTANEOUS
  Filled 2016-03-17: qty 1

## 2016-03-22 DIAGNOSIS — Z7901 Long term (current) use of anticoagulants: Secondary | ICD-10-CM | POA: Diagnosis not present

## 2016-04-06 ENCOUNTER — Other Ambulatory Visit (HOSPITAL_COMMUNITY): Payer: Self-pay | Admitting: *Deleted

## 2016-04-07 ENCOUNTER — Encounter (HOSPITAL_COMMUNITY)
Admission: RE | Admit: 2016-04-07 | Discharge: 2016-04-07 | Disposition: A | Discharge status: Home / Self Care | Payer: Medicare Other | Source: Ambulatory Visit | Attending: Nephrology | Admitting: Nephrology

## 2016-04-07 DIAGNOSIS — D631 Anemia in chronic kidney disease: Secondary | ICD-10-CM | POA: Diagnosis not present

## 2016-04-07 DIAGNOSIS — N184 Chronic kidney disease, stage 4 (severe): Secondary | ICD-10-CM

## 2016-04-07 DIAGNOSIS — N183 Chronic kidney disease, stage 3 (moderate): Secondary | ICD-10-CM | POA: Diagnosis not present

## 2016-04-07 LAB — RENAL FUNCTION PANEL
ALBUMIN: 3.9 g/dL (ref 3.5–5.0)
Anion gap: 11 (ref 5–15)
BUN: 48 mg/dL — AB (ref 6–20)
CO2: 23 mmol/L (ref 22–32)
CREATININE: 2.08 mg/dL — AB (ref 0.44–1.00)
Calcium: 9.3 mg/dL (ref 8.9–10.3)
Chloride: 105 mmol/L (ref 101–111)
GFR calc Af Amer: 26 mL/min — ABNORMAL LOW (ref >60)
GFR calc non Af Amer: 23 mL/min — ABNORMAL LOW (ref >60)
GLUCOSE: 99 mg/dL (ref 65–99)
PHOSPHORUS: 4.1 mg/dL (ref 2.5–4.6)
POTASSIUM: 4.6 mmol/L (ref 3.5–5.1)
SODIUM: 139 mmol/L (ref 135–145)

## 2016-04-07 LAB — PROTIME-INR
INR: 1.92
Prothrombin Time: 22.3 seconds — ABNORMAL HIGH (ref 11.4–15.2)

## 2016-04-07 LAB — IRON AND TIBC
Iron: 114 ug/dL (ref 28–170)
Saturation Ratios: 53 % — ABNORMAL HIGH (ref 10.4–31.8)
TIBC: 217 ug/dL — ABNORMAL LOW (ref 250–450)
UIBC: 103 ug/dL

## 2016-04-07 LAB — POCT HEMOGLOBIN-HEMACUE: HEMOGLOBIN: 11.1 g/dL — AB (ref 12.0–15.0)

## 2016-04-07 LAB — FERRITIN: Ferritin: 824 ng/mL — ABNORMAL HIGH (ref 11–307)

## 2016-04-07 MED ORDER — EPOETIN ALFA 40000 UNIT/ML IJ SOLN
INTRAMUSCULAR | Status: AC
Start: 2016-04-07 — End: 2016-04-07
  Administered 2016-04-07: 40000 [IU] via SUBCUTANEOUS
  Filled 2016-04-07: qty 1

## 2016-04-07 MED ORDER — EPOETIN ALFA 10000 UNIT/ML IJ SOLN: 40000.0000 [IU] | INTRAMUSCULAR | Status: DC

## 2016-04-22 DIAGNOSIS — Z0000 Encounter for general adult medical examination without abnormal findings: Secondary | ICD-10-CM | POA: Diagnosis not present

## 2016-04-28 ENCOUNTER — Inpatient Hospital Stay (HOSPITAL_COMMUNITY): Admission: RE | Admit: 2016-04-28 | Payer: Medicare Other | Source: Ambulatory Visit

## 2016-06-02 DIAGNOSIS — M329 Systemic lupus erythematosus, unspecified: Secondary | ICD-10-CM | POA: Diagnosis not present

## 2016-06-02 DIAGNOSIS — N184 Chronic kidney disease, stage 4 (severe): Secondary | ICD-10-CM | POA: Diagnosis not present

## 2016-06-02 DIAGNOSIS — D631 Anemia in chronic kidney disease: Secondary | ICD-10-CM | POA: Diagnosis not present

## 2016-06-02 DIAGNOSIS — Z940 Kidney transplant status: Secondary | ICD-10-CM | POA: Diagnosis not present

## 2016-06-02 DIAGNOSIS — Z7901 Long term (current) use of anticoagulants: Secondary | ICD-10-CM | POA: Diagnosis not present

## 2016-06-02 DIAGNOSIS — I129 Hypertensive chronic kidney disease with stage 1 through stage 4 chronic kidney disease, or unspecified chronic kidney disease: Secondary | ICD-10-CM | POA: Diagnosis not present

## 2016-06-02 DIAGNOSIS — N2589 Other disorders resulting from impaired renal tubular function: Secondary | ICD-10-CM | POA: Diagnosis not present

## 2016-06-02 DIAGNOSIS — Z86718 Personal history of other venous thrombosis and embolism: Secondary | ICD-10-CM | POA: Diagnosis not present

## 2016-06-02 DIAGNOSIS — M109 Gout, unspecified: Secondary | ICD-10-CM | POA: Diagnosis not present

## 2016-06-02 DIAGNOSIS — N390 Urinary tract infection, site not specified: Secondary | ICD-10-CM | POA: Diagnosis not present

## 2016-06-02 DIAGNOSIS — N2581 Secondary hyperparathyroidism of renal origin: Secondary | ICD-10-CM | POA: Diagnosis not present

## 2016-06-02 DIAGNOSIS — I4891 Unspecified atrial fibrillation: Secondary | ICD-10-CM | POA: Diagnosis not present

## 2016-06-08 ENCOUNTER — Other Ambulatory Visit (HOSPITAL_COMMUNITY): Payer: Self-pay | Admitting: *Deleted

## 2016-06-09 ENCOUNTER — Encounter (HOSPITAL_COMMUNITY)
Admission: RE | Admit: 2016-06-09 | Discharge: 2016-06-09 | Disposition: A | Discharge status: Home / Self Care | Payer: Medicare Other | Source: Ambulatory Visit | Attending: Nephrology | Admitting: Nephrology

## 2016-06-09 DIAGNOSIS — D631 Anemia in chronic kidney disease: Secondary | ICD-10-CM | POA: Diagnosis not present

## 2016-06-09 DIAGNOSIS — N184 Chronic kidney disease, stage 4 (severe): Secondary | ICD-10-CM

## 2016-06-09 DIAGNOSIS — N183 Chronic kidney disease, stage 3 (moderate): Secondary | ICD-10-CM | POA: Diagnosis not present

## 2016-06-09 LAB — IRON AND TIBC
Iron: 83 ug/dL (ref 28–170)
Saturation Ratios: 38 % — ABNORMAL HIGH (ref 10.4–31.8)
TIBC: 218 ug/dL — ABNORMAL LOW (ref 250–450)
UIBC: 135 ug/dL

## 2016-06-09 LAB — RENAL FUNCTION PANEL
Albumin: 3.9 g/dL (ref 3.5–5.0)
Anion gap: 9 (ref 5–15)
BUN: 44 mg/dL — AB (ref 6–20)
CALCIUM: 9.4 mg/dL (ref 8.9–10.3)
CHLORIDE: 106 mmol/L (ref 101–111)
CO2: 23 mmol/L (ref 22–32)
CREATININE: 2.1 mg/dL — AB (ref 0.44–1.00)
GFR calc Af Amer: 26 mL/min — ABNORMAL LOW (ref >60)
GFR, EST NON AFRICAN AMERICAN: 22 mL/min — AB (ref >60)
Glucose, Bld: 106 mg/dL — ABNORMAL HIGH (ref 65–99)
Phosphorus: 4.6 mg/dL (ref 2.5–4.6)
Potassium: 4.8 mmol/L (ref 3.5–5.1)
SODIUM: 138 mmol/L (ref 135–145)

## 2016-06-09 LAB — PROTIME-INR
INR: 1.8
PROTHROMBIN TIME: 21.1 s — AB (ref 11.4–15.2)

## 2016-06-09 LAB — POCT HEMOGLOBIN-HEMACUE: HEMOGLOBIN: 10.1 g/dL — AB (ref 12.0–15.0)

## 2016-06-09 LAB — FERRITIN: FERRITIN: 1011 ng/mL — AB (ref 11–307)

## 2016-06-09 MED ORDER — EPOETIN ALFA 10000 UNIT/ML IJ SOLN: 40000.0000 [IU] | INTRAMUSCULAR | Status: DC

## 2016-06-09 MED ORDER — EPOETIN ALFA 40000 UNIT/ML IJ SOLN
INTRAMUSCULAR | Status: AC
Start: 2016-06-09 — End: 2016-06-09
  Administered 2016-06-09: 40000 [IU] via SUBCUTANEOUS
  Filled 2016-06-09: qty 1

## 2016-06-27 DIAGNOSIS — N390 Urinary tract infection, site not specified: Secondary | ICD-10-CM | POA: Diagnosis not present

## 2016-06-30 ENCOUNTER — Encounter (HOSPITAL_COMMUNITY)
Admission: RE | Admit: 2016-06-30 | Discharge: 2016-06-30 | Disposition: A | Discharge status: Home / Self Care | Payer: Medicare Other | Source: Ambulatory Visit | Attending: Nephrology | Admitting: Nephrology

## 2016-06-30 DIAGNOSIS — N183 Chronic kidney disease, stage 3 (moderate): Secondary | ICD-10-CM | POA: Diagnosis not present

## 2016-06-30 DIAGNOSIS — N184 Chronic kidney disease, stage 4 (severe): Secondary | ICD-10-CM

## 2016-06-30 DIAGNOSIS — D631 Anemia in chronic kidney disease: Secondary | ICD-10-CM | POA: Diagnosis not present

## 2016-06-30 LAB — POCT HEMOGLOBIN-HEMACUE: HEMOGLOBIN: 10.5 g/dL — AB (ref 12.0–15.0)

## 2016-06-30 MED ORDER — EPOETIN ALFA 40000 UNIT/ML IJ SOLN
INTRAMUSCULAR | Status: AC
  Administered 2016-06-30: 40000 [IU] via SUBCUTANEOUS
  Filled 2016-06-30: qty 1

## 2016-06-30 MED ORDER — EPOETIN ALFA 10000 UNIT/ML IJ SOLN: 40000.0000 [IU] | INTRAMUSCULAR | Status: DC

## 2016-06-30 NOTE — Progress Notes (Signed)
Called [facility] and spoke with Stacy regarding time frame for lab orders on therapy plan and new PT/INR order. Orders received to do all labs every 6 weeks except for the hemocue that is to be done every 3 weeks at the injection appointment.

## 2016-07-07 IMAGING — MG MM DIGITAL SCREENING BILAT
4 series · 4 of 4 positions shown · non-contrast
Comparison: Previous exam(s).

CLINICAL DATA: Screening.

EXAM:
DIGITAL SCREENING BILATERAL MAMMOGRAM WITH CAD

[L MLO]
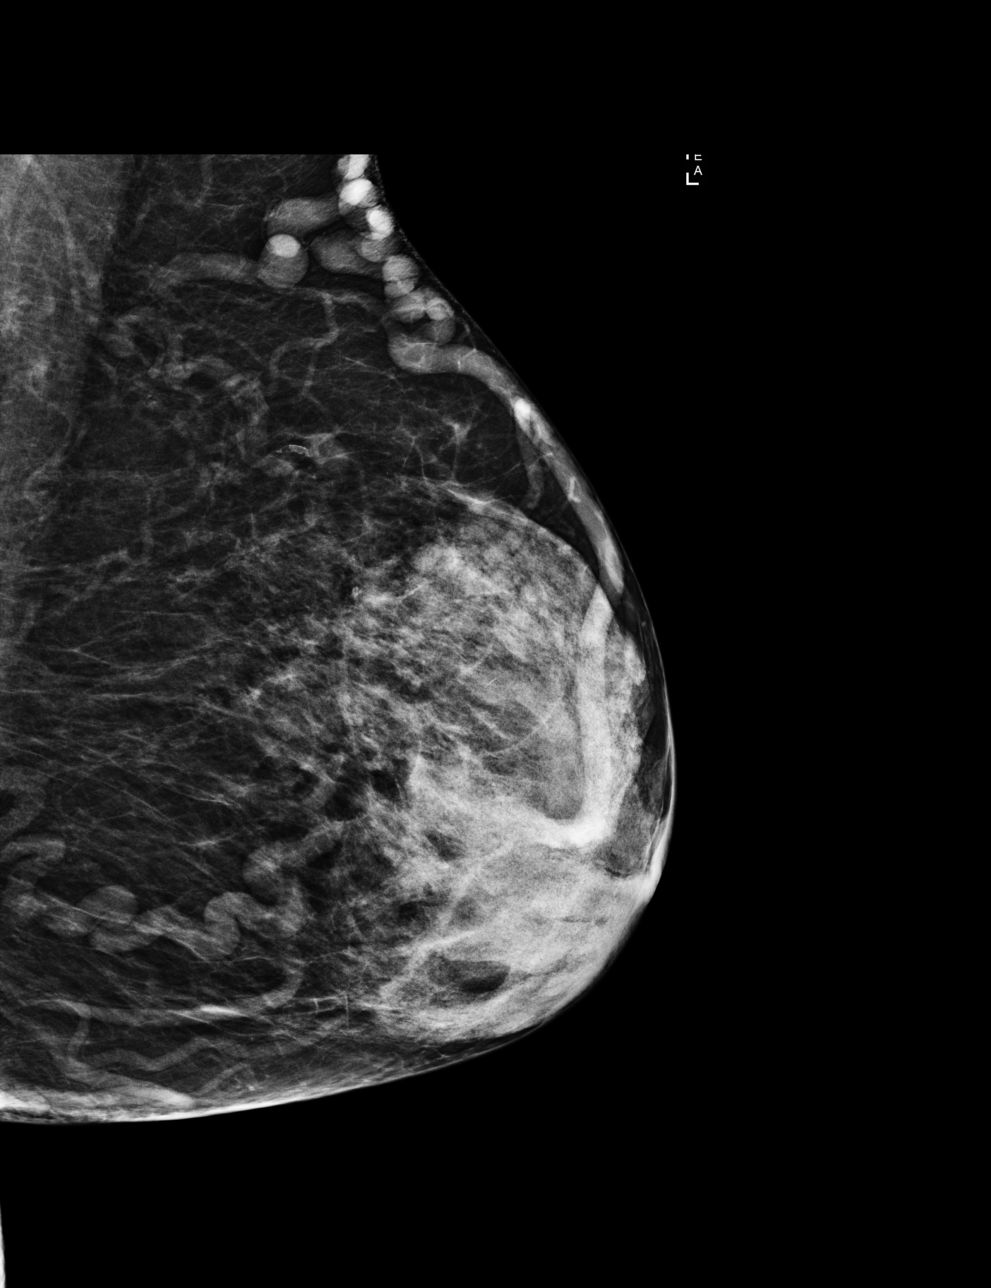

[R MLO]
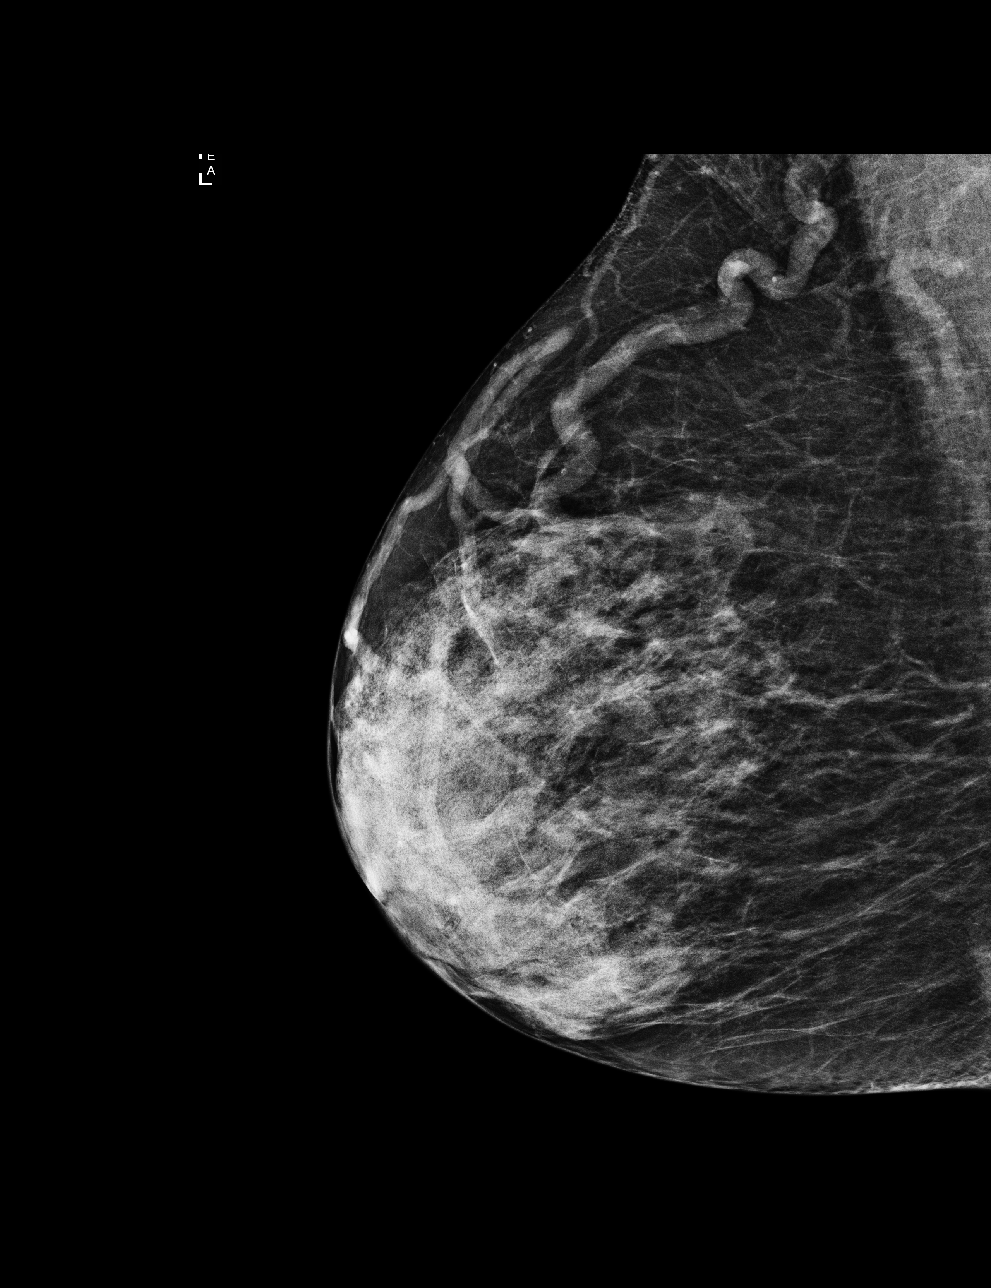

[L CC]
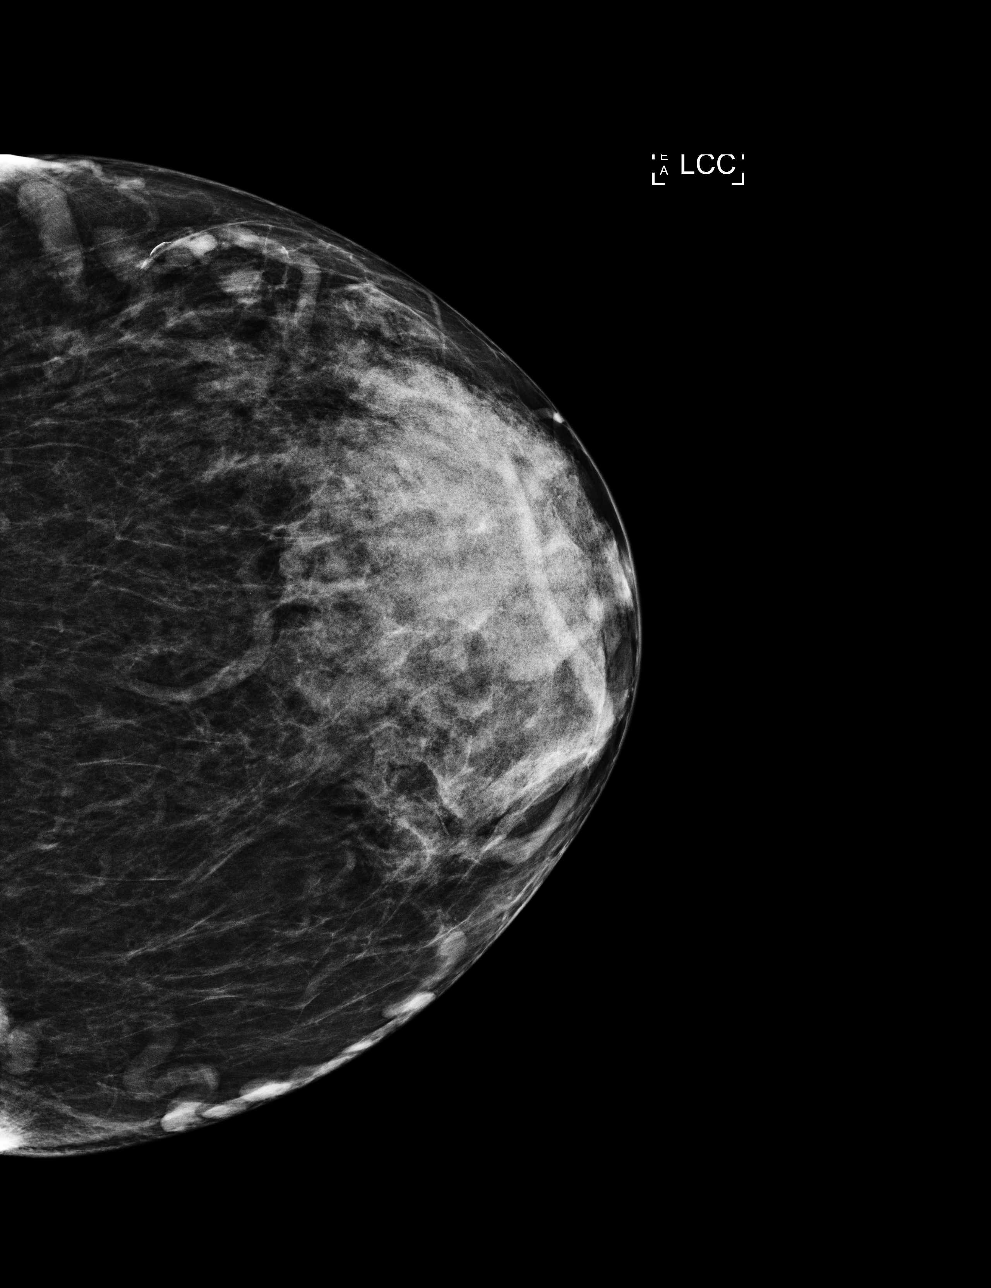

[R CC]
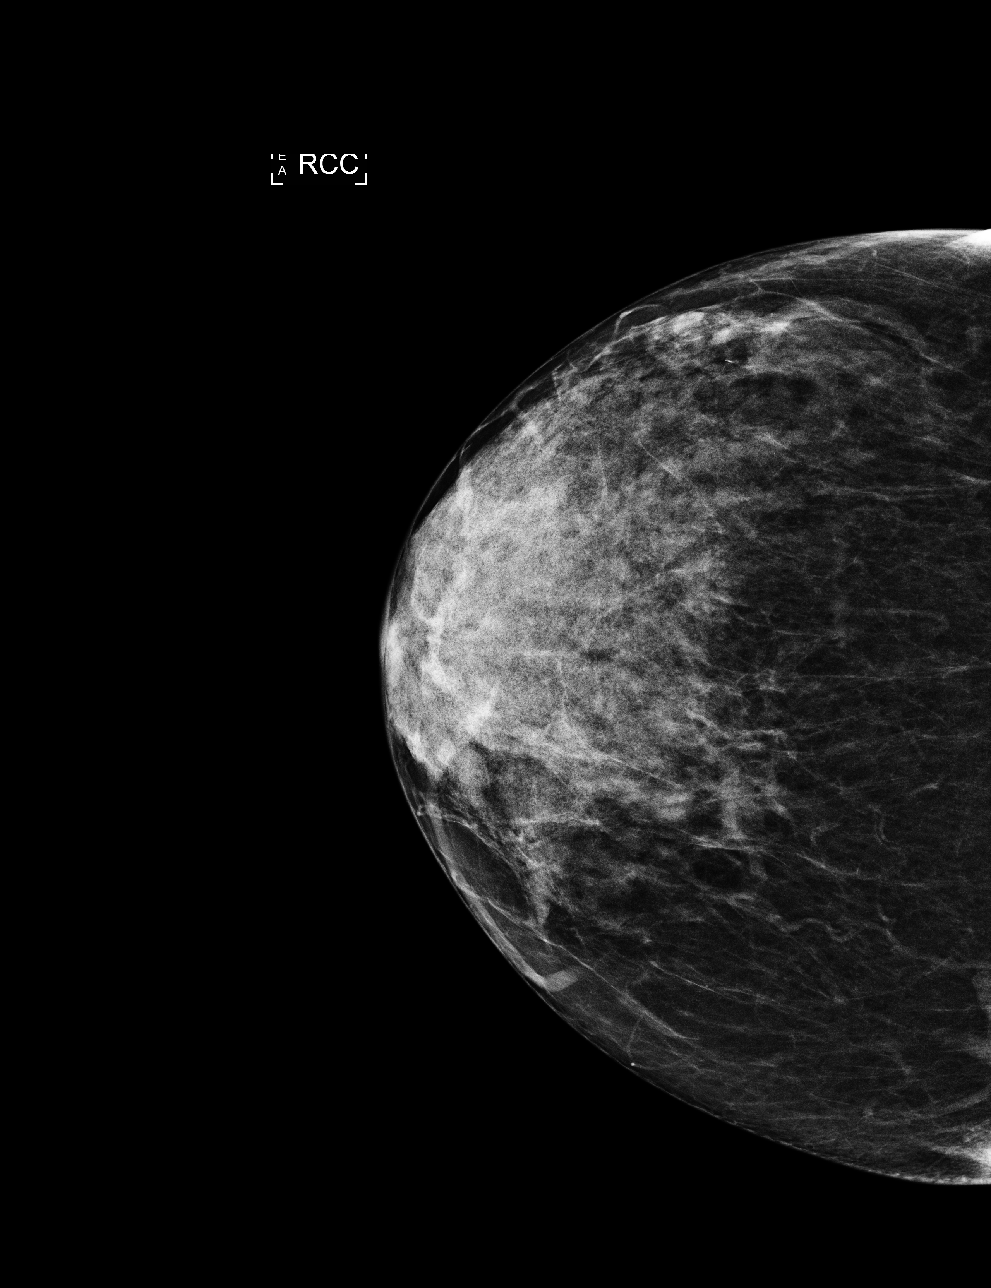

[4 of 4 positions shown; findings below may reference images not displayed]

ACR Breast Density Category c: The breast tissue is heterogeneously
dense, which may obscure small masses.
FINDINGS: There are no findings suspicious for malignancy. Images were
processed with CAD.
IMPRESSION: No mammographic evidence of malignancy. A result letter of this
screening mammogram will be mailed directly to the patient.

RECOMMENDATION:
Screening mammogram in one year. (Code:YJ-[id number])

BI-RADS CATEGORY  1: Negative.

## 2016-07-11 ENCOUNTER — Ambulatory Visit: Payer: Medicare Other | Admitting: Dietician

## 2016-07-13 ENCOUNTER — Ambulatory Visit: Payer: Self-pay

## 2016-07-14 ENCOUNTER — Other Ambulatory Visit: Payer: Self-pay

## 2016-07-14 NOTE — Patient Outreach (Addendum)
[name] [facility]) Care Management  07/14/2016  LASHAWNDA Mcfarland [birth year] [id number]   Telephonic Screening and Initial Assessment.    Referral 07/12/16   Source: [name] @ [facility] Issue:   Issue:  Unable to afford outpatient nutrition services.  Medicare will not cover nutritional services after 3 years.  RN & CSW referral - "ask them to call [name]".   Subjective:    Outreach call #1 to patient.  Patient reached and completed call.  [place] [phone] (W) [phone] (M)  Providers:   Dr. Lyndal Pulley - next appt 6 month evaluation scheduled for Monday 3/12. Insurance:  Medicare and [employer]   Psycho/Social: Patient lives in her home with husband.  Mobility: ambulates with cane and reports balance issues managed with use of cane.  Falls: none : Advance Directive: yes Consent:  06-26-15 pt signed DPR allowing access to [name] to Cincere Deprey (husband) [phone] and [name] (son) [phone], ok to leave detailed message on cell phone/gd DME: cane, BP cuff, scales.   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  HTN, OSA, GERD, Crohn's disease  Admissions: 0 ER visits: 0 Patient states her conditions are managed but she needs a review on her Renal Diet and Nutritional needs.   BP 153/60 06/30/2016 Weight 136 lb (62 kg) 03/03/2016 Height 63 in (160 cm) 03/03/2016 BMI 24.10 (Normal) 03/03/2016  Lipid Panel completed 07/19/2008 HDL 53.000 M 12/08/2015 LDL 72.000 07/19/2008 Cholesterol, total 158.000 M 12/08/2015 Triglycerides 96.000 M 12/08/2015 A1C 6.500 11/12/2014 Glucose Random 106.000 06/09/2016  Medications:   Co-pay cost issues: none Flu Vaccine: 2017 Pneumovax (PPS 12/21/2010  Encounter Medications:  Outpatient Encounter Prescriptions as of 07/14/2016  Medication Sig  . acetaminophen (TYLENOL) 325 MG tablet Take 650 mg by  mouth every 6 (six) hours as needed for mild pain.  Marland Kitchen allopurinol (ZYLOPRIM) 100 MG tablet Take 100 mg by mouth 2 (two) times daily.   . calcitRIOL (ROCALTROL) 0.25 MCG capsule Take 0.25 mcg by mouth daily.   . cefUROXime (CEFTIN) 500 MG tablet Take 1 tablet (500 mg total) by mouth 2 (two) times daily with a meal.  . cephALEXin (KEFLEX) 500 MG capsule Take 500 mg by mouth 2 (two) times daily.  Marland Kitchen epoetin alfa (EPOGEN,PROCRIT) 94496 UNIT/ML injection Inject 40,000 Units into the skin every 21 ( twenty-one) days.   . feeding supplement, ENSURE ENLIVE, (ENSURE ENLIVE) LIQD Take 237 mLs by mouth 2 (two) times daily between meals.  . fluticasone (FLONASE) 50 MCG/ACT nasal spray Place 2 sprays into both nostrils daily.  Marland Kitchen gabapentin (NEURONTIN) 100 MG capsule Take 2 capsules (200 mg total) by mouth at bedtime.  Marland Kitchen HYDROcodone-acetaminophen (NORCO) 5-325 MG tablet Take 1 tablet by mouth every 6 (six) hours as needed for moderate pain.  Marland Kitchen levothyroxine (SYNTHROID, LEVOTHROID) 100 MCG tablet Take 1 tablet (100 mcg total) by mouth daily.  Marland Kitchen MAGNESIUM-OXIDE 400 (241.3 MG) MG tablet Take 400 mg by mouth 2 (two) times daily.  . metoprolol tartrate (LOPRESSOR) 25 MG tablet Take 1 tablet (25 mg total) by mouth 2 (two) times daily.  . mycophenolate (MYFORTIC) 180 MG EC tablet Take 360 mg by mouth 2 (two) times daily.    . NONFORMULARY OR COMPOUNDED ITEM Lipid , TSH----  DX hypothyroid, chronic kidney disease, stage 4  . NONFORMULARY OR COMPOUNDED ITEM Compression hose-- thigh high  20-30 mm hg  #1 as directed  Dx-- varicose veins b/l with pain  . polyethylene glycol (MIRALAX / GLYCOLAX) packet Take 17 g by mouth 2 (two) times daily as needed. For constipation  . predniSONE (DELTASONE) 5 MG tablet Take 5 mg by mouth daily.   . tacrolimus (PROGRAF) 1 MG capsule Take 4 mg by mouth 2 (two) times daily.   Marland Kitchen warfarin (COUMADIN) 2.5 MG tablet Take 2.5 mg by mouth daily.    No facility-administered encounter medications  on file as of 07/14/2016.     Functional Status:  In your present state of health, do you have any difficulty performing the following activities: 07/14/2016 02/25/2016  Hearing? N N  Vision? N N  Difficulty concentrating or making decisions? N N  Walking or climbing stairs? N N  Dressing or bathing? N N  Doing errands, shopping? N -  Preparing Food and eating ? N -  Using the Toilet? N -  In the past six months, have you accidently leaked urine? N -  Do you have problems with loss of bowel control? N -  Managing your Medications? N -  Managing your Finances? N -  Housekeeping or managing your Housekeeping? N -  Some recent data might be hidden    Fall/Depression Screening: PHQ 2/9 Scores 07/14/2016 06/26/2015  PHQ - 2 Score 0 0   Fall Risk  07/14/2016 06/26/2015  Falls in the past year? No No  Risk for fall due to : Impaired mobility -     Preventives: Hearing None  Eyes - 2017  Eye glasses  Past cataract surgery  Pulm - Spirometry 06/19/2012 Flexible Sigmoidoscopy 11/12/2014 Mammogram 08/26/2015   Assessment / Plan:    Referral 07/12/16   Source: [name] @ [facility] Issue:   Issue:  Unable to afford outpatient nutrition services.  Medicare will not cover nutritional services after 3 years.  RN & CSW referral - "ask them to call [name]". 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  Patient unable to afford outpatient renal nutrition services.  Medicare will not cover nutritional services after 3 years post transplant (2006).   [facility] Manager, Bary Castilla notified of  special need 07/14/16.     RN CM advised in next [facility] scheduled contact call within next 30 days for monthly assessment and / or care coordination services as needed. RN CM advised to please notify MD of any changes in condition prior to scheduled appt's.   RN CM provided contact name and # [phone] or main office # [phone] and 24-hour nurse line # [phone].  RN CM confirmed patient is aware of 911 services for urgent emergency needs.  RN CM sent successful outreach letter and  [facility] Introductory package. RN CM sent Physician Enrollment/Barriers Letter and Initial Assessment to Primary MD RN CM notified [place] Management Assistant: agreed to services/case opened.  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    Role Documenting the Problem One  Care Management Telephonic [place] for Problem One  Active  [facility] Long  Term Goal (31-90 days)  Patient will review self management interventions for chronic kidney disease management and diet over the next 31 - 90 days.   [facility] Long Term Goal Start Date  07/14/16  Interventions for Problem One Long Term Goal  RN CM will provide education and review over the next 31-90 days.   [facility] CM Short Term Goal #1 (0-30 days)  Patient will review renal diet needs over the next 30 days.   [facility] CM Short Term Goal #1 Start Date  07/14/16  Interventions for Short Term Goal #1  RN CM will provide [place] Renal Diet education over the next 30 days.     [facility] CM Care Plan Problem Two   Flowsheet Row Most Recent Value  Care Plan Problem Two  Resource needs for Renal / Nutritional Classes due to no Medicare / BCBS Benefit.   Role Documenting the Problem Two  Care Management Telephonic Coordinator  Care Plan for Problem Two  Active  [facility] CM Short Term Goal #1 (0-30 days)  Patient will engage with [facility] SW services over the next  30 days.   [facility] CM Short Term Goal #1 Start Date  07/14/16  Interventions for Short Term Goal #2   RN CM will send SW referral over the next 30 days.     [facility] CM Care Plan Problem Three   Flowsheet Row Most Recent Value  Care Plan Problem Three  Risk of Falls associated to poor balance.    Role Documenting the Problem Three  Care Management Telephonic Coordinator  Care Plan for Problem Three  Active  [facility] CM Short Term Goal #1 (0-30 days)  Patient will use cane and report any worsening balance or mobility issues to her MD over the next 30 days.   [facility] CM Short Term Goal #1 Start Date  07/14/16  Interventions for Short Term Goal #1  RN CM will provide education on safety/fall prevention over the next 30 days.       Nathaneil Canary, BSN, RN, [place] Care Management Care Management Coordinator [phone] Direct [phone] Cell [phone] Office [phone] Fax [email or web address]

## 2016-07-15 ENCOUNTER — Ambulatory Visit: Payer: Self-pay

## 2016-07-15 ENCOUNTER — Encounter: Payer: Self-pay | Admitting: *Deleted

## 2016-07-15 NOTE — Progress Notes (Signed)
Pre visit review using our clinic review tool, if applicable. No additional management support is needed unless otherwise documented below in the visit note. 

## 2016-07-15 NOTE — Progress Notes (Deleted)
Subjective:   [name] is a 76 y.o. female who presents for an Initial Medicare Annual Wellness Visit.  The Patient was informed that the wellness visit is to identify future health risk and educate and initiate measures that can reduce risk for increased disease through the lifespan.   Describes health as fair, good or great?    Review of Systems  Physical assessment deferred to PCP.    Sleep patterns: {SX; SLEEP PATTERNS:18802::"feels rested on waking","does not get up to void","gets up *** times nightly to void","sleeps *** hours nightly"}.   Home Safety/Smoke Alarms:   Living environment; residence and Firearm Safety: {Rehab home environment / accessibility:30080::"no firearms","firearms stored safely"}. Seat Belt Safety/Bike Helmet: Wears seat belt.   Counseling:   Eye Exam-  Dental-  Female:   CCS- Last 08/30/13: hx of Crohn's dx. No f/u on file. PSA- No results found for: PSA     Objective:    There were no vitals filed for this visit. There is no height or weight on file to calculate BMI.  Current Medications (verified) Outpatient Encounter Prescriptions as of 07/18/2016  Medication Sig  . acetaminophen (TYLENOL) 325 MG tablet Take 650 mg by mouth every 6 (six) hours as needed for mild pain.  Marland Kitchen allopurinol (ZYLOPRIM) 100 MG tablet Take 100 mg by mouth 2 (two) times daily.   . calcitRIOL (ROCALTROL) 0.25 MCG capsule Take 0.25 mcg by mouth daily.   . cefUROXime (CEFTIN) 500 MG tablet Take 1 tablet (500 mg total) by mouth 2 (two) times daily with a meal.  . cephALEXin (KEFLEX) 500 MG capsule Take 500 mg by mouth 2 (two) times daily.  Marland Kitchen epoetin alfa (EPOGEN,PROCRIT) 08657 UNIT/ML injection Inject 40,000 Units into the skin every 21 ( twenty-one) days.   . feeding supplement, ENSURE ENLIVE, (ENSURE ENLIVE) LIQD Take 237 mLs by mouth 2 (two) times daily between meals.  . fluticasone (FLONASE) 50 MCG/ACT nasal spray Place 2 sprays into both nostrils daily.  Marland Kitchen  gabapentin (NEURONTIN) 100 MG capsule Take 2 capsules (200 mg total) by mouth at bedtime.  Marland Kitchen HYDROcodone-acetaminophen (NORCO) 5-325 MG tablet Take 1 tablet by mouth every 6 (six) hours as needed for moderate pain.  Marland Kitchen levothyroxine (SYNTHROID, LEVOTHROID) 100 MCG tablet Take 1 tablet (100 mcg total) by mouth daily.  Marland Kitchen MAGNESIUM-OXIDE 400 (241.3 MG) MG tablet Take 400 mg by mouth 2 (two) times daily.  . metoprolol tartrate (LOPRESSOR) 25 MG tablet Take 1 tablet (25 mg total) by mouth 2 (two) times daily.  . mycophenolate (MYFORTIC) 180 MG EC tablet Take 360 mg by mouth 2 (two) times daily.    . NONFORMULARY OR COMPOUNDED ITEM Lipid , TSH----  DX hypothyroid, chronic kidney disease, stage 4  . NONFORMULARY OR COMPOUNDED ITEM Compression hose-- thigh high  20-30 mm hg  #1 as directed Dx-- varicose veins b/l with pain  . polyethylene glycol (MIRALAX / GLYCOLAX) packet Take 17 g by mouth 2 (two) times daily as needed. For constipation  . predniSONE (DELTASONE) 5 MG tablet Take 5 mg by mouth daily.   . tacrolimus (PROGRAF) 1 MG capsule Take 4 mg by mouth 2 (two) times daily.   Marland Kitchen warfarin (COUMADIN) 2.5 MG tablet Take 2.5 mg by mouth daily.    No facility-administered encounter medications on file as of 07/18/2016.     Allergies (verified) Amoxicillin; Ampicillin; Ciprofloxacin; Erythromycin; Penicillins; Sulfa antibiotics; Sulfamethoxazole-trimethoprim; Tetracycline; and Labetalol   History: Past Medical History:  Diagnosis Date  . Anemia    "  when lupus flares up"  . Anxiety   . Arthritis    "in my joints"  . Atrial fibrillation ([place])   . CAP (community acquired pneumonia) 08/20/2014  . Crohn's disease ([place])   . DVT of leg (deep venous thrombosis) ([place]) 2012-8./13/2013   "have had one in each leg now"  . DVT of lower extremity, bilateral ([place])    "have had them in both legs; last one was on the RLE this year" (04/25/2012)  . GERD (gastroesophageal reflux disease)   . Gout 2016  .  Hypertension   . Hypothyroidism   . Lower GI bleed 2012  . Numbness and tingling of foot    bilaterally  . OSA on CPAP   . Refusal of blood transfusion for reasons of conscience 12/20/2011   pt is Jehovah's Witness  . Renal cell carcinoma   . Renal disorder    S/P nephrectomy; dialysis; "working fine now" (12/20/11)  . SLE (systemic lupus erythematosus) ([place])    Past Surgical History:  Procedure Laterality Date  . [place]   "found sluggish bone marrow" (04/25/2012)  . BONE MARROW BIOPSY  1993; 1995  . CATARACT EXTRACTION W/ INTRAOCULAR LENS  IMPLANT, BILATERAL  2005-2010  . CESAREAN SECTION  1984  . CHOLECYSTECTOMY  1995  . COLECTOMY  1998   "removal of abscess" (04/25/2012)  . COLONOSCOPY N/A 08/30/2013   Procedure: COLONOSCOPY;  Surgeon: Winfield Cunas., MD;  Location: [facility];  Service: Endoscopy;  Laterality: N/A;  . COLOSTOMY  1998  . COLOSTOMY REVERSAL  1999   "6 months after placed" (04/25/2012)  . EXCISIONAL HEMORRHOIDECTOMY    . INSERTION OF DIALYSIS CATHETER  1988-2006   "peritoneal and hemodialysis; had multiple grafts and fistulas; last graft clotted off 2012"  . NEPHRECTOMY TRANSPLANTED ORGAN  2006   bilaterally  . PARATHYROID IMPLANT REMOVAL Left    removed parathyroid from neck and implanted in arm  . VENA CAVA FILTER PLACEMENT  2012   Family History  Problem Relation Age of Onset  . Heart disease Mother   . Hypertension      runs in family  . Sleep apnea Sister    Social History   Occupational History  . retired-bank teller Retired   Social History Main Topics  . Smoking status: Never Smoker  . Smokeless tobacco: Never Used  . Alcohol use No  . Drug use: No  . Sexual activity: Yes    Birth control/ protection: Post-menopausal   Tobacco Counseling Counseling given: Not Answered   Activities of Daily Living In your present state of health, do you have any difficulty performing the following activities:  07/14/2016 02/25/2016  Hearing? N N  Vision? N N  Difficulty concentrating or making decisions? N N  Walking or climbing stairs? N N  Dressing or bathing? N N  Doing errands, shopping? N -  Preparing Food and eating ? N -  Using the Toilet? N -  In the past six months, have you accidently leaked urine? N -  Do you have problems with loss of bowel control? N -  Managing your Medications? N -  Managing your Finances? N -  Housekeeping or managing your Housekeeping? N -  Some recent data might be hidden    Immunizations and Health Maintenance Immunization History  Administered Date(s) Administered  . Influenza Split 03/09/2013, 03/09/2014  . Influenza-Unspecified 03/10/2015  . Pneumococcal Polysaccharide-23 12/21/2010   There are no preventive care reminders to display for  this patient.  Patient Care Team: Ann Held, DO as PCP - General (Family Medicine) Mauricia Area, MD as Consulting Physician (Nephrology) Deneise Lever, MD as Consulting Physician (Pulmonary Disease) Laurence Spates, MD as Consulting Physician (Gastroenterology) Josue Hector, MD as Consulting Physician (Cardiology) Naoma Diener, MD (Neurology) Standley Brooking, RN as [place], LCSW as Coin any recent [place] you may have received from other than [facility] providers in the past year (date may be approximate).    Assessment:   This is a routine wellness examination for [place]. Physical assessment deferred to PCP.   Hearing/Vision screen No exam data present  Dietary issues and exercise activities discussed:   Diet (meal preparation, eat out, water intake, caffeinated beverages, dairy products, fruits and vegetables): {Desc; diets:16563} Breakfast: Lunch:  Dinner:      Goals    None     Depression Screen PHQ 2/9 Scores 07/14/2016 06/26/2015  PHQ - 2 Score 0 0    Fall Risk Fall Risk   07/14/2016 06/26/2015  Falls in the past year? No No  Risk for fall due to : Impaired mobility -    Cognitive Function:        Screening Tests Health Maintenance  Topic Date Due  . INFLUENZA VACCINE  01/17/2017 (Originally 12/08/2015)  . TETANUS/TDAP  01/17/2017 (Originally [birth year]1962)  . PNA vac Low Risk Adult (2 of 2 - PCV13) 01/17/2017 (Originally 12/21/2011)  . MAMMOGRAM  08/20/2017  . COLONOSCOPY  08/31/2023  . DEXA SCAN  Completed        Plan:   ***  During the course of the visit Ziya was educated and counseled about the following appropriate screening and preventive services:   Vaccines to include Pneumoccal, Influenza, Hepatitis B, Td, Zostavax, HCV  Electrocardiogram  Colorectal cancer screening  Cardiovascular disease screening  Diabetes screening  Glaucoma screening  Nutrition counseling  Prostate cancer screening  Smoking cessation counseling  Patient Instructions (the written plan) were given to the patient.   Shela Nevin, [place]   07/15/2016

## 2016-07-18 ENCOUNTER — Ambulatory Visit (INDEPENDENT_AMBULATORY_CARE_PROVIDER_SITE_OTHER): Payer: Medicare Other | Admitting: Family Medicine

## 2016-07-18 ENCOUNTER — Other Ambulatory Visit: Payer: Self-pay | Admitting: *Deleted

## 2016-07-18 ENCOUNTER — Ambulatory Visit: Payer: Self-pay

## 2016-07-18 ENCOUNTER — Encounter: Payer: Self-pay | Admitting: Family Medicine

## 2016-07-18 VITALS — BP 126/70 | HR 73 | Temp 98.1°F | Resp 16 | Ht 63.0 in | Wt 137.2 lb

## 2016-07-18 DIAGNOSIS — I10 Essential (primary) hypertension: Secondary | ICD-10-CM

## 2016-07-18 DIAGNOSIS — M109 Gout, unspecified: Secondary | ICD-10-CM

## 2016-07-18 DIAGNOSIS — G629 Polyneuropathy, unspecified: Secondary | ICD-10-CM | POA: Diagnosis not present

## 2016-07-18 DIAGNOSIS — E039 Hypothyroidism, unspecified: Secondary | ICD-10-CM | POA: Diagnosis not present

## 2016-07-18 LAB — COMPREHENSIVE METABOLIC PANEL
ALT: 10 U/L (ref 0–35)
AST: 15 U/L (ref 0–37)
Albumin: 4 g/dL (ref 3.5–5.2)
Alkaline Phosphatase: 144 U/L — ABNORMAL HIGH (ref 39–117)
BILIRUBIN TOTAL: 0.4 mg/dL (ref 0.2–1.2)
BUN: 54 mg/dL — ABNORMAL HIGH (ref 6–23)
CHLORIDE: 107 meq/L (ref 96–112)
CO2: 24 meq/L (ref 19–32)
Calcium: 9.7 mg/dL (ref 8.4–10.5)
Creatinine, Ser: 1.87 mg/dL — ABNORMAL HIGH (ref 0.40–1.20)
GFR: 33.92 mL/min — AB (ref >60.00)
Glucose, Bld: 103 mg/dL — ABNORMAL HIGH (ref 70–99)
POTASSIUM: 4.4 meq/L (ref 3.5–5.1)
Sodium: 139 mEq/L (ref 135–145)
Total Protein: 6.8 g/dL (ref 6.0–8.3)

## 2016-07-18 LAB — LIPID PANEL
CHOL/HDL RATIO: 3
Cholesterol: 165 mg/dL (ref 0–200)
HDL: 47.5 mg/dL (ref >39.00)
LDL CALC: 87 mg/dL (ref 0–99)
NONHDL: 117.6
Triglycerides: 151 mg/dL — ABNORMAL HIGH (ref 0.0–149.0)
VLDL: 30.2 mg/dL (ref 0.0–40.0)

## 2016-07-18 LAB — URIC ACID: Uric Acid, Serum: 3.9 mg/dL (ref 2.4–7.0)

## 2016-07-18 MED ORDER — GABAPENTIN 100 MG PO CAPS: 200.0000 mg | ORAL_CAPSULE | Freq: Every day | ORAL | Status: AC

## 2016-07-18 MED ORDER — ALLOPURINOL 100 MG PO TABS: 100.0000 mg | ORAL_TABLET | Freq: Two times a day (BID) | ORAL | 2 refills | Status: DC

## 2016-07-18 MED ORDER — LEVOTHYROXINE SODIUM 100 MCG PO TABS: 100.0000 ug | ORAL_TABLET | Freq: Every day | ORAL | Status: AC

## 2016-07-18 MED ORDER — METOPROLOL TARTRATE 25 MG PO TABS: 25.0000 mg | ORAL_TABLET | Freq: Two times a day (BID) | ORAL | Status: DC

## 2016-07-18 NOTE — Progress Notes (Deleted)
Subjective:   [name] is a 76 y.o. female who presents for an Initial Medicare Annual Wellness Visit.  Review of Systems    No ROS.  Medicare Wellness Visit.   Sleep patterns: {SX; SLEEP PATTERNS:18802::"feels rested on waking","does not get up to void","gets up *** times nightly to void","sleeps *** hours nightly"}.   Home Safety/Smoke Alarms:   Living environment; residence and Firearm Safety: {Rehab home environment / accessibility:30080::"no firearms","firearms stored safely"}. Seat Belt Safety/Bike Helmet: Wears seat belt.   Counseling:   Eye Exam-  Dental-  Female:   Pap-       Mammo- Last 08/21/15: BI-RADS CATEGORY  1: Negative.     Dexa scan- Last 08/21/15:Osteoporosis.       CCS- Last 08/30/13: hx of Crohn's dx.     Objective:    Today's Vitals   07/18/16 1053  BP: 126/70  Pulse: 73  Resp: 16  Temp: 98.1 F (36.7 C)  TempSrc: Oral  SpO2: 93%  Weight: 137 lb 3.2 oz (62.2 kg)  Height: 5' 3"  (1.6 m)   Body mass index is 24.3 kg/m.   Current Medications (verified) Outpatient Encounter Prescriptions as of 07/18/2016  Medication Sig  . acetaminophen (TYLENOL) 325 MG tablet Take 650 mg by mouth every 6 (six) hours as needed for mild pain.  Marland Kitchen allopurinol (ZYLOPRIM) 100 MG tablet Take 100 mg by mouth 2 (two) times daily.   . calcitRIOL (ROCALTROL) 0.25 MCG capsule Take 0.25 mcg by mouth daily.   . cefUROXime (CEFTIN) 500 MG tablet Take 1 tablet (500 mg total) by mouth 2 (two) times daily with a meal.  . epoetin alfa (EPOGEN,PROCRIT) 03500 UNIT/ML injection Inject 40,000 Units into the skin every 21 ( twenty-one) days.   . feeding supplement, ENSURE ENLIVE, (ENSURE ENLIVE) LIQD Take 237 mLs by mouth 2 (two) times daily between meals.  . fluticasone (FLONASE) 50 MCG/ACT nasal spray Place 2 sprays into both nostrils daily.  Marland Kitchen gabapentin (NEURONTIN) 100 MG capsule Take 2 capsules (200 mg total) by mouth at bedtime.  Marland Kitchen HYDROcodone-acetaminophen (NORCO) 5-325 MG  tablet Take 1 tablet by mouth every 6 (six) hours as needed for moderate pain.  Marland Kitchen levothyroxine (SYNTHROID, LEVOTHROID) 100 MCG tablet Take 1 tablet (100 mcg total) by mouth daily.  Marland Kitchen MAGNESIUM-OXIDE 400 (241.3 MG) MG tablet Take 400 mg by mouth 2 (two) times daily.  . metoprolol tartrate (LOPRESSOR) 25 MG tablet Take 1 tablet (25 mg total) by mouth 2 (two) times daily.  . mycophenolate (MYFORTIC) 180 MG EC tablet Take 360 mg by mouth 2 (two) times daily.    . NONFORMULARY OR COMPOUNDED ITEM Lipid , TSH----  DX hypothyroid, chronic kidney disease, stage 4  . NONFORMULARY OR COMPOUNDED ITEM Compression hose-- thigh high  20-30 mm hg  #1 as directed Dx-- varicose veins b/l with pain  . polyethylene glycol (MIRALAX / GLYCOLAX) packet Take 17 g by mouth 2 (two) times daily as needed. For constipation  . predniSONE (DELTASONE) 5 MG tablet Take 5 mg by mouth daily.   . tacrolimus (PROGRAF) 1 MG capsule Take 4 mg by mouth 2 (two) times daily.   Marland Kitchen warfarin (COUMADIN) 2.5 MG tablet Take 2.5 mg by mouth daily.   . [DISCONTINUED] cephALEXin (KEFLEX) 500 MG capsule Take 500 mg by mouth 2 (two) times daily.   No facility-administered encounter medications on file as of 07/18/2016.     Allergies (verified) Amoxicillin; Ampicillin; Ciprofloxacin; Erythromycin; Penicillins; Sulfa antibiotics; Sulfamethoxazole-trimethoprim; Tetracycline; and Labetalol  History: Past Medical History:  Diagnosis Date  . Anemia    "when lupus flares up"  . Anxiety   . Arthritis    "in my joints"  . Atrial fibrillation ([place])   . CAP (community acquired pneumonia) 08/20/2014  . Crohn's disease ([place])   . DVT of leg (deep venous thrombosis) ([place]) 2012-8./13/2013   "have had one in each leg now"  . DVT of lower extremity, bilateral ([place])    "have had them in both legs; last one was on the RLE this year" (04/25/2012)  . GERD (gastroesophageal reflux disease)   . Gout 2016  . Hypertension   . Hypothyroidism   . Lower GI  bleed 2012  . Numbness and tingling of foot    bilaterally  . OSA on CPAP   . Refusal of blood transfusion for reasons of conscience 12/20/2011   pt is Jehovah's Witness  . Renal cell carcinoma   . Renal disorder    S/P nephrectomy; dialysis; "working fine now" (12/20/11)  . SLE (systemic lupus erythematosus) ([place])    Past Surgical History:  Procedure Laterality Date  . [place]   "found sluggish bone marrow" (04/25/2012)  . BONE MARROW BIOPSY  1993; 1995  . CATARACT EXTRACTION W/ INTRAOCULAR LENS  IMPLANT, BILATERAL  2005-2010  . CESAREAN SECTION  1984  . CHOLECYSTECTOMY  1995  . COLECTOMY  1998   "removal of abscess" (04/25/2012)  . COLONOSCOPY N/A 08/30/2013   Procedure: COLONOSCOPY;  Surgeon: Winfield Cunas., MD;  Location: [facility];  Service: Endoscopy;  Laterality: N/A;  . COLOSTOMY  1998  . COLOSTOMY REVERSAL  1999   "6 months after placed" (04/25/2012)  . EXCISIONAL HEMORRHOIDECTOMY    . INSERTION OF DIALYSIS CATHETER  1988-2006   "peritoneal and hemodialysis; had multiple grafts and fistulas; last graft clotted off 2012"  . NEPHRECTOMY TRANSPLANTED ORGAN  2006   bilaterally  . PARATHYROID IMPLANT REMOVAL Left    removed parathyroid from neck and implanted in arm  . VENA CAVA FILTER PLACEMENT  2012   Family History  Problem Relation Age of Onset  . Heart disease Mother   . Hypertension      runs in family  . Sleep apnea Sister    Social History   Occupational History  . retired-bank teller Retired   Social History Main Topics  . Smoking status: Never Smoker  . Smokeless tobacco: Never Used  . Alcohol use No  . Drug use: No  . Sexual activity: Yes    Birth control/ protection: Post-menopausal    Tobacco Counseling Counseling given: Not Answered   Activities of Daily Living In your present state of health, do you have any difficulty performing the following activities: 07/14/2016 02/25/2016  Hearing? N N  Vision? N N    Difficulty concentrating or making decisions? N N  Walking or climbing stairs? N N  Dressing or bathing? N N  Doing errands, shopping? N -  Preparing Food and eating ? N -  Using the Toilet? N -  In the past six months, have you accidently leaked urine? N -  Do you have problems with loss of bowel control? N -  Managing your Medications? N -  Managing your Finances? N -  Housekeeping or managing your Housekeeping? N -  Some recent data might be hidden    Immunizations and Health Maintenance Immunization History  Administered Date(s) Administered  . Influenza Split 03/09/2013, 03/09/2014  . Influenza-Unspecified 03/10/2015  .  Pneumococcal Polysaccharide-23 12/21/2010   There are no preventive care reminders to display for this patient.  Patient Care Team: Ann Held, DO as PCP - General (Family Medicine) Mauricia Area, MD as Consulting Physician (Nephrology) Deneise Lever, MD as Consulting Physician (Pulmonary Disease) Laurence Spates, MD as Consulting Physician (Gastroenterology) Josue Hector, MD as Consulting Physician (Cardiology) Naoma Diener, MD (Neurology) Standley Brooking, RN as [place], LCSW as [place] any recent [place] you may have received from other than [facility] providers in the past year (date may be approximate).     Assessment:   This is a routine wellness examination for [place]. Physical assessment deferred to PCP.  Hearing/Vision screen No exam data present  Dietary issues and exercise activities discussed:   Diet (meal preparation, eat out, water intake, caffeinated beverages, dairy products, fruits and vegetables): {Desc; diets:16563} Breakfast: Lunch:  Dinner:      Goals    None     Depression Screen PHQ 2/9 Scores 07/14/2016 06/26/2015  PHQ - 2 Score 0 0    Fall Risk Fall Risk  07/14/2016 06/26/2015  Falls in the past year? No  No  Risk for fall due to : Impaired mobility -    Cognitive Function:        Screening Tests Health Maintenance  Topic Date Due  . INFLUENZA VACCINE  01/17/2017 (Originally 12/08/2015)  . TETANUS/TDAP  01/17/2017 (Originally 05/01/1962)  . PNA vac Low Risk Adult (2 of 2 - PCV13) 01/17/2017 (Originally 12/21/2011)  . MAMMOGRAM  08/20/2017  . COLONOSCOPY  08/31/2023  . DEXA SCAN  Completed      Plan:   ***  During the course of the visit, Elene was educated and counseled about the following appropriate screening and preventive services:   Vaccines to include Pneumoccal, Influenza, Hepatitis B, Td, Zostavax, HCV  Cardiovascular disease screening  Colorectal cancer screening  Bone density screening  Diabetes screening  Glaucoma screening  Mammography/PAP  Nutrition counseling  Smoking cessation counseling  Patient Instructions (the written plan) were given to the patient.    Shela Nevin, [place]   07/18/2016

## 2016-07-18 NOTE — Patient Outreach (Signed)
[place] [facility]) Care Management  07/18/2016  [name] 1941/02/23 [id number]  CSW made an initial attempt to try and contact patient today to perform phone assessment, as well as assess and assist with social needs and services, without success.  A HIPAA complaint message was left for patient on voicemail.  CSW is currently awaiting a return call.  CSW will make a second outreach attempt in one week, if CSW does not receive a return call from patient in the meantime. Nat Christen, BSW, MSW, LCSW  Licensed Education officer, [facility]  Mailing [place] [address], [place], [place] [address] Physical Address-[address], [place], [place] [address] Toll Free Main # [phone] Fax # [phone] Cell # [phone]  Office # [phone] [email or web address]

## 2016-07-18 NOTE — Assessment & Plan Note (Signed)
Well controlled, no changes to meds. Encouraged heart healthy diet such as the DASH diet and exercise as tolerated.  °

## 2016-07-18 NOTE — Patient Instructions (Signed)

## 2016-07-18 NOTE — Progress Notes (Signed)
Patient ID: [name], female   DOB: 20-Jan-1941, 76 y.o.   MRN: [id number]     Subjective:  I acted as a Education administrator for Dr. Carollee Herter.  Guerry Bruin, [place]   Patient ID: [name], female    DOB: [birth year], 76 y.o.   MRN: [id number]  Chief Complaint  Patient presents with  . Hypertension  . Hypothyroidism     HPI  Patient is in today for follow up blood pressure and thyroid.   Patient Care Team: Ann Held, DO as PCP - General (Family Medicine) Mauricia Area, MD as Consulting Physician (Nephrology) Deneise Lever, MD as Consulting Physician (Pulmonary Disease) Laurence Spates, MD as Consulting Physician (Gastroenterology) Josue Hector, MD as Consulting Physician (Cardiology) Naoma Diener, MD (Neurology) Standley Brooking, RN as [place], LCSW as [place] Management   Past Medical History:  Diagnosis Date  . Anemia    "when lupus flares up"  . Anxiety   . Arthritis    "in my joints"  . Atrial fibrillation ([place])   . CAP (community acquired pneumonia) 08/20/2014  . Crohn's disease ([place])   . DVT of leg (deep venous thrombosis) ([place]) 2012-8./13/2013   "have had one in each leg now"  . DVT of lower extremity, bilateral ([place])    "have had them in both legs; last one was on the RLE this year" (04/25/2012)  . GERD (gastroesophageal reflux disease)   . Gout 2016  . Hypertension   . Hypothyroidism   . Lower GI bleed 2012  . Numbness and tingling of foot    bilaterally  . OSA on CPAP   . Refusal of blood transfusion for reasons of conscience 12/20/2011   pt is Jehovah's Witness  . Renal cell carcinoma   . Renal disorder    S/P nephrectomy; dialysis; "working fine now" (12/20/11)  . SLE (systemic lupus erythematosus) ([place])     Past Surgical History:  Procedure Laterality Date  . [place]   "found sluggish bone marrow" (04/25/2012)  . BONE MARROW BIOPSY   1993; 1995  . CATARACT EXTRACTION W/ INTRAOCULAR LENS  IMPLANT, BILATERAL  2005-2010  . CESAREAN SECTION  1984  . CHOLECYSTECTOMY  1995  . COLECTOMY  1998   "removal of abscess" (04/25/2012)  . COLONOSCOPY N/A 08/30/2013   Procedure: COLONOSCOPY;  Surgeon: Winfield Cunas., MD;  Location: [facility];  Service: Endoscopy;  Laterality: N/A;  . COLOSTOMY  1998  . COLOSTOMY REVERSAL  1999   "6 months after placed" (04/25/2012)  . EXCISIONAL HEMORRHOIDECTOMY    . INSERTION OF DIALYSIS CATHETER  1988-2006   "peritoneal and hemodialysis; had multiple grafts and fistulas; last graft clotted off 2012"  . NEPHRECTOMY TRANSPLANTED ORGAN  2006   bilaterally  . PARATHYROID IMPLANT REMOVAL Left    removed parathyroid from neck and implanted in arm  . VENA CAVA FILTER PLACEMENT  2012    Family History  Problem Relation Age of Onset  . Heart disease Mother   . Hypertension      runs in family  . Sleep apnea Sister     Social History   Social History  . Marital status: Married    Spouse name: N/A  . Number of children: 3  . Years of education: N/A   Occupational History  . retired-bank teller Retired   Social History Main Topics  . Smoking status: Never Smoker  . Smokeless  tobacco: Never Used  . Alcohol use No  . Drug use: No  . Sexual activity: Yes    Birth control/ protection: Post-menopausal   Other Topics Concern  . Not on file   Social History Narrative  . No narrative on file    Outpatient Medications Prior to Visit  Medication Sig Dispense Refill  . acetaminophen (TYLENOL) 325 MG tablet Take 650 mg by mouth every 6 (six) hours as needed for mild pain.    . calcitRIOL (ROCALTROL) 0.25 MCG capsule Take 0.25 mcg by mouth daily.     . cefUROXime (CEFTIN) 500 MG tablet Take 1 tablet (500 mg total) by mouth 2 (two) times daily with a meal. 14 tablet 0  . epoetin alfa (EPOGEN,PROCRIT) 09628 UNIT/ML injection Inject 40,000 Units into the skin every 21 ( twenty-one) days.      . feeding supplement, ENSURE ENLIVE, (ENSURE ENLIVE) LIQD Take 237 mLs by mouth 2 (two) times daily between meals. 60 Bottle 12  . fluticasone (FLONASE) 50 MCG/ACT nasal spray Place 2 sprays into both nostrils daily. 16 g 1  . HYDROcodone-acetaminophen (NORCO) 5-325 MG tablet Take 1 tablet by mouth every 6 (six) hours as needed for moderate pain. 6 tablet 0  . MAGNESIUM-OXIDE 400 (241.3 MG) MG tablet Take 400 mg by mouth 2 (two) times daily.  0  . mycophenolate (MYFORTIC) 180 MG EC tablet Take 360 mg by mouth 2 (two) times daily.      . NONFORMULARY OR COMPOUNDED ITEM Lipid , TSH----  DX hypothyroid, chronic kidney disease, stage 4 1 each 0  . NONFORMULARY OR COMPOUNDED ITEM Compression hose-- thigh high  20-30 mm hg  #1 as directed Dx-- varicose veins b/l with pain 1 each 0  . polyethylene glycol (MIRALAX / GLYCOLAX) packet Take 17 g by mouth 2 (two) times daily as needed. For constipation    . predniSONE (DELTASONE) 5 MG tablet Take 5 mg by mouth daily.     . tacrolimus (PROGRAF) 1 MG capsule Take 4 mg by mouth 2 (two) times daily.     Marland Kitchen warfarin (COUMADIN) 2.5 MG tablet Take 2.5 mg by mouth daily.     Marland Kitchen allopurinol (ZYLOPRIM) 100 MG tablet Take 100 mg by mouth 2 (two) times daily.     . cephALEXin (KEFLEX) 500 MG capsule Take 500 mg by mouth 2 (two) times daily.    Marland Kitchen gabapentin (NEURONTIN) 100 MG capsule Take 2 capsules (200 mg total) by mouth at bedtime.    Marland Kitchen levothyroxine (SYNTHROID, LEVOTHROID) 100 MCG tablet Take 1 tablet (100 mcg total) by mouth daily.    . metoprolol tartrate (LOPRESSOR) 25 MG tablet Take 1 tablet (25 mg total) by mouth 2 (two) times daily.     No facility-administered medications prior to visit.     Allergies  Allergen Reactions  . Amoxicillin Anaphylaxis  . Ampicillin Anaphylaxis  . Ciprofloxacin Swelling    "of my throat"  . Erythromycin Anaphylaxis  . Penicillins Anaphylaxis  . Sulfa Antibiotics Itching  . Sulfamethoxazole-Trimethoprim Itching  .  Tetracycline Anaphylaxis  . Labetalol Nausea And Vomiting    Review of Systems  Constitutional: Negative for fever and malaise/fatigue.  HENT: Negative for congestion.   Eyes: Negative for blurred vision.  Respiratory: Negative for cough and shortness of breath.   Cardiovascular: Negative for chest pain, palpitations and leg swelling.  Gastrointestinal: Negative for vomiting.  Musculoskeletal: Negative for back pain.  Skin: Negative for rash.  Neurological: Negative for loss of consciousness and  headaches.       Objective:    Physical Exam  Constitutional: She is oriented to person, place, and time. She appears well-developed and well-nourished. No distress.  HENT:  Head: Normocephalic and atraumatic.  Eyes: Conjunctivae are normal.  Neck: Normal range of motion. No thyromegaly present.  Cardiovascular: Normal rate and regular rhythm.   Pulmonary/Chest: Effort normal and breath sounds normal. She has no wheezes.  Abdominal: Soft. Bowel sounds are normal. There is no tenderness.  Musculoskeletal: Normal range of motion. She exhibits no edema or deformity.  Neurological: She is alert and oriented to person, place, and time.  Skin: Skin is warm and dry. She is not diaphoretic.  Psychiatric: She has a normal mood and affect.    BP 126/70 (BP Location: Right Arm, Cuff Size: Normal)   Pulse 73   Temp 98.1 F (36.7 C) (Oral)   Resp 16   Ht _0  (1.6 m)   Wt 137 lb 3.2 oz (62.2 kg)   LMP 03/05/2014   SpO2 93%   BMI 24.30 kg/m  Wt Readings from Last 3 Encounters:  07/18/16 137 lb 3.2 oz (62.2 kg)  07/14/16 136 lb (61.7 kg)  03/03/16 135 lb 9.6 oz (61.5 kg)     Lab Results  Component Value Date   WBC 6.8 10/31/2015   HGB 10.5 (L) 06/30/2016   HCT 35.6 (L) 10/31/2015   PLT 101 (L) 10/31/2015   GLUCOSE 106 (H) 06/09/2016   CHOL  07/19/2008    124        ATP III CLASSIFICATION:  <200     mg/dL   Desirable  200-239  mg/dL   Borderline High  >=240    mg/dL   High           TRIG 89 07/19/2008   HDL 34 (L) 07/19/2008   LDLCALC  07/19/2008    72        Total Cholesterol/HDL:CHD Risk Coronary Heart Disease Risk Table                     Men   Women  1/2 Average Risk   3.4   3.3  Average Risk       5.0   4.4  2 X Average Risk   9.6   7.1  3 X Average Risk  23.4   11.0        Use the calculated Patient Ratio above and the CHD Risk Table to determine the patient's CHD Risk.        ATP III CLASSIFICATION (LDL):  <100     mg/dL   Optimal  100-129  mg/dL   Near or Above                    Optimal  130-159  mg/dL   Borderline  160-189  mg/dL   High  >190     mg/dL   Very High   ALT 16 10/31/2015   AST 23 10/31/2015   NA 138 06/09/2016   K 4.8 06/09/2016   CL 106 06/09/2016   CREATININE 2.10 (H) 06/09/2016   BUN 44 (H) 06/09/2016   CO2 23 06/09/2016   TSH 0.706 11/12/2014   INR 1.80 06/09/2016   HGBA1C 6.5 (H) 11/12/2014    Lab Results  Component Value Date   TSH 0.706 11/12/2014   Lab Results  Component Value Date   WBC 6.8 10/31/2015   HGB 10.5 (L) 06/30/2016  HCT 35.6 (L) 10/31/2015   MCV 93.7 10/31/2015   PLT 101 (L) 10/31/2015   Lab Results  Component Value Date   NA 138 06/09/2016   K 4.8 06/09/2016   CO2 23 06/09/2016   GLUCOSE 106 (H) 06/09/2016   BUN 44 (H) 06/09/2016   CREATININE 2.10 (H) 06/09/2016   BILITOT 0.9 10/31/2015   ALKPHOS 162 (H) 10/31/2015   AST 23 10/31/2015   ALT 16 10/31/2015   PROT 6.8 10/31/2015   ALBUMIN 3.9 06/09/2016   CALCIUM 9.4 06/09/2016   ANIONGAP 9 06/09/2016   Lab Results  Component Value Date   CHOL  07/19/2008    124        ATP III CLASSIFICATION:  <200     mg/dL   Desirable  200-239  mg/dL   Borderline High  >=240    mg/dL   High          Lab Results  Component Value Date   HDL 34 (L) 07/19/2008   Lab Results  Component Value Date   LDLCALC  07/19/2008    72        Total Cholesterol/HDL:CHD Risk Coronary Heart Disease Risk Table                     Men   Women   1/2 Average Risk   3.4   3.3  Average Risk       5.0   4.4  2 X Average Risk   9.6   7.1  3 X Average Risk  23.4   11.0        Use the calculated Patient Ratio above and the CHD Risk Table to determine the patient's CHD Risk.        ATP III CLASSIFICATION (LDL):  <100     mg/dL   Optimal  100-129  mg/dL   Near or Above                    Optimal  130-159  mg/dL   Borderline  160-189  mg/dL   High  >190     mg/dL   Very High   Lab Results  Component Value Date   TRIG 89 07/19/2008   Lab Results  Component Value Date   CHOLHDL 3.6 07/19/2008   Lab Results  Component Value Date   HGBA1C 6.5 (H) 11/12/2014       Assessment & Plan:   Problem List Items Addressed This Visit      Unprioritized   HTN (hypertension) - Primary (Chronic)    Well controlled, no changes to meds. Encouraged heart healthy diet such as the DASH diet and exercise as tolerated.       Relevant Medications   metoprolol tartrate (LOPRESSOR) 25 MG tablet   Other Relevant Orders   Comprehensive metabolic panel   Lipid panel   Hypothyroidism    Check tsh con't synthroid      Relevant Medications   levothyroxine (SYNTHROID, LEVOTHROID) 100 MCG tablet   metoprolol tartrate (LOPRESSOR) 25 MG tablet   Other Relevant Orders   TSH    Other Visit Diagnoses    Gout of right foot, unspecified cause, unspecified chronicity       Relevant Medications   allopurinol (ZYLOPRIM) 100 MG tablet   Other Relevant Orders   Uric acid   Neuropathy (HCC)       Relevant Medications   gabapentin (NEURONTIN) 100 MG capsule  I have discontinued Ms. Caputo cephALEXin. I have also changed her allopurinol. Additionally, I am having her maintain her calcitRIOL, polyethylene glycol, mycophenolate, predniSONE, epoetin alfa, tacrolimus, acetaminophen, MAGNESIUM-OXIDE, feeding supplement (ENSURE ENLIVE), warfarin, fluticasone, cefUROXime, NONFORMULARY OR COMPOUNDED ITEM, NONFORMULARY OR COMPOUNDED ITEM,  HYDROcodone-acetaminophen, gabapentin, levothyroxine, and metoprolol tartrate.  Meds ordered this encounter  Medications  . allopurinol (ZYLOPRIM) 100 MG tablet    Sig: Take 1 tablet (100 mg total) by mouth 2 (two) times daily.    Dispense:  30 tablet    Refill:  2  . gabapentin (NEURONTIN) 100 MG capsule    Sig: Take 2 capsules (200 mg total) by mouth at bedtime.  Marland Kitchen levothyroxine (SYNTHROID, LEVOTHROID) 100 MCG tablet    Sig: Take 1 tablet (100 mcg total) by mouth daily.  . metoprolol tartrate (LOPRESSOR) 25 MG tablet    Sig: Take 1 tablet (25 mg total) by mouth 2 (two) times daily.    CMA served as Education administrator during this visit. History, Physical and Plan performed by medical provider. Documentation and orders reviewed and attested to.  Ann Held, DO

## 2016-07-18 NOTE — Assessment & Plan Note (Signed)
Per nephrology 

## 2016-07-18 NOTE — Assessment & Plan Note (Signed)
Check tsh con't synthroid

## 2016-07-19 ENCOUNTER — Other Ambulatory Visit: Payer: Self-pay

## 2016-07-19 LAB — TSH: TSH: 0.35 u[IU]/mL (ref 0.35–4.50)

## 2016-07-19 NOTE — Patient Outreach (Signed)
[facility]) Care Management  07/19/2016  [name] April 26, 1941 [id number]   Communication with [facility] Interdisciplinary Team: Bary Castilla, [place] and SW, [place], [facility] has no resources for copayment. [facility] no longer offers nutritional supplements.  SW will need to assess the need.  [facility] SW active with case and will follow-up with patient.   [name], BSN, RN, [facility] Care Management Coordinator [phone] Direct [phone] Cell [phone] Office [phone] Fax [name]

## 2016-07-20 NOTE — Progress Notes (Signed)
Subjective:   [name] is a 76 y.o. female who presents for an Initial Medicare Annual Wellness Visit. She is accompanied by her husband.   Review of Systems    No ROS.  Medicare Wellness Visit. Cardiac Risk Factors include: advanced age (>55mn, >>25women);hypertension Sleep patterns:  At least 8 hrs per night. Uses CPAP. Feels rested. Urinates at least twice. Home Safety/Smoke Alarms:  Feels safe in home. Smoke alarms in place.  Living environment; residence and Firearm Safety: Lives at home with husband. No guns Seat Belt Safety/Bike Helmet: Wears seat belt.   Counseling:   Eye Exam- Wears glasses. Dr.Fontain annually. Dental- Dr.Kwan annually.  Female:   Pap- Pt states no longer due to age.    Mammo- Last 08/21/15: BI-RADS CATEGORY  1: Negative.       Dexa scan- Last 08/21/15: osteoporosis.        CCS- Last 08/30/13: hx of crohn's dx.     Objective:    Today's Vitals   07/22/16 1410 07/22/16 1412  BP: 126/70   Pulse: 95   SpO2: 98%   Weight: 137 lb (62.1 kg)   Height: 5' 3" (1.6 m)   PainSc:  0-No pain   Body mass index is 24.27 kg/m.   Current Medications (verified) Outpatient Encounter Prescriptions as of 07/22/2016  Medication Sig  . acetaminophen (TYLENOL) 325 MG tablet Take 650 mg by mouth every 6 (six) hours as needed for mild pain.  .[place] Kitchenallopurinol (ZYLOPRIM) 100 MG tablet Take 1 tablet (100 mg total) by mouth 2 (two) times daily.  . calcitRIOL (ROCALTROL) 0.25 MCG capsule Take 0.25 mcg by mouth daily.   .[place] Kitchenepoetin alfa (EPOGEN,PROCRIT) 401561UNIT/ML injection Inject 40,000 Units into the skin every 21 ( twenty-one) days.   . feeding supplement, ENSURE ENLIVE, (ENSURE ENLIVE) LIQD Take 237 mLs by mouth 2 (two) times daily between meals.  . fluticasone (FLONASE) 50 MCG/ACT nasal spray Place 2 sprays into both nostrils daily.  .[place] Kitchengabapentin (NEURONTIN) 100 MG capsule Take 2 capsules (200 mg total) by mouth at bedtime.  .Marland Kitchenlevothyroxine (SYNTHROID,  LEVOTHROID) 100 MCG tablet Take 1 tablet (100 mcg total) by mouth daily.  .Marland KitchenMAGNESIUM-OXIDE 400 (241.3 MG) MG tablet Take 400 mg by mouth 2 (two) times daily.  . metoprolol tartrate (LOPRESSOR) 25 MG tablet Take 1 tablet (25 mg total) by mouth 2 (two) times daily.  . polyethylene glycol (MIRALAX / GLYCOLAX) packet Take 17 g by mouth 2 (two) times daily as needed. For constipation  . predniSONE (DELTASONE) 5 MG tablet Take 5 mg by mouth daily.   . tacrolimus (PROGRAF) 1 MG capsule Take 4 mg by mouth 2 (two) times daily.   .Marland Kitchenwarfarin (COUMADIN) 2.5 MG tablet Take 2.5 mg by mouth daily.   .Marland KitchenHYDROcodone-acetaminophen (NORCO) 5-325 MG tablet Take 1 tablet by mouth every 6 (six) hours as needed for moderate pain. (Patient not taking: Reported on 07/22/2016)  . mycophenolate (MYFORTIC) 180 MG EC tablet Take 360 mg by mouth 2 (two) times daily.    . NONFORMULARY OR COMPOUNDED ITEM Lipid , TSH----  DX hypothyroid, chronic kidney disease, stage 4  . NONFORMULARY OR COMPOUNDED ITEM Compression hose-- thigh high  20-30 mm hg  #1 as directed Dx-- varicose veins b/l with pain  . [DISCONTINUED] cefUROXime (CEFTIN) 500 MG tablet Take 1 tablet (500 mg total) by mouth 2 (two) times daily with a meal.   No facility-administered encounter medications on file as of 07/22/2016.  Allergies (verified) Amoxicillin; Ampicillin; Ciprofloxacin; Erythromycin; Penicillins; Sulfa antibiotics; Sulfamethoxazole-trimethoprim; Tetracycline; and Labetalol   History: Past Medical History:  Diagnosis Date  . Anemia    "when lupus flares up"  . Anxiety   . Arthritis    "in my joints"  . Atrial fibrillation ([place])   . CAP (community acquired pneumonia) 08/20/2014  . Crohn's disease ([place])   . DVT of leg (deep venous thrombosis) ([place]) 2012-8./13/2013   "have had one in each leg now"  . DVT of lower extremity, bilateral ([place])    "have had them in both legs; last one was on the RLE this year" (04/25/2012)  . GERD  (gastroesophageal reflux disease)   . Gout 2016  . Hypertension   . Hypothyroidism   . Lower GI bleed 2012  . Numbness and tingling of foot    bilaterally  . OSA on CPAP   . Refusal of blood transfusion for reasons of conscience 12/20/2011   pt is Jehovah's Witness  . Renal cell carcinoma   . Renal disorder    S/P nephrectomy; dialysis; "working fine now" (12/20/11)  . SLE (systemic lupus erythematosus) ([place])    Past Surgical History:  Procedure Laterality Date  . [place]   "found sluggish bone marrow" (04/25/2012)  . BONE MARROW BIOPSY  1993; 1995  . CATARACT EXTRACTION W/ INTRAOCULAR LENS  IMPLANT, BILATERAL  2005-2010  . CESAREAN SECTION  1984  . CHOLECYSTECTOMY  1995  . COLECTOMY  1998   "removal of abscess" (04/25/2012)  . COLONOSCOPY N/A 08/30/2013   Procedure: COLONOSCOPY;  Surgeon: Winfield Cunas., MD;  Location: [facility];  Service: Endoscopy;  Laterality: N/A;  . COLOSTOMY  1998  . COLOSTOMY REVERSAL  1999   "6 months after placed" (04/25/2012)  . EXCISIONAL HEMORRHOIDECTOMY    . INSERTION OF DIALYSIS CATHETER  1988-2006   "peritoneal and hemodialysis; had multiple grafts and fistulas; last graft clotted off 2012"  . NEPHRECTOMY TRANSPLANTED ORGAN  2006   bilaterally  . PARATHYROID IMPLANT REMOVAL Left    removed parathyroid from neck and implanted in arm  . VENA CAVA FILTER PLACEMENT  2012   Family History  Problem Relation Age of Onset  . Heart disease Mother   . Hypertension      runs in family  . Sleep apnea Sister    Social History   Occupational History  . retired-bank teller Retired   Social History Main Topics  . Smoking status: Never Smoker  . Smokeless tobacco: Never Used  . Alcohol use No  . Drug use: No  . Sexual activity: Yes    Birth control/ protection: Post-menopausal    Tobacco Counseling Counseling given: No   Activities of Daily Living In your present state of health, do you have any  difficulty performing the following activities: 07/22/2016 07/14/2016  Hearing? N N  Vision? N N  Difficulty concentrating or making decisions? N N  Walking or climbing stairs? N N  Dressing or bathing? N N  Doing errands, shopping? N N  Preparing Food and eating ? N N  Using the Toilet? N N  In the past six months, have you accidently leaked urine? N N  Do you have problems with loss of bowel control? N N  Managing your Medications? N N  Managing your Finances? N N  Housekeeping or managing your Housekeeping? N N  Some recent data might be hidden    Immunizations and Health Maintenance Immunization History  Administered  Date(s) Administered  . Influenza Split 03/09/2013, 03/09/2014  . Influenza-Unspecified 03/10/2015  . Pneumococcal Polysaccharide-23 12/21/2010   There are no preventive care reminders to display for this patient.  Patient Care Team: Ann Held, DO as PCP - General (Family Medicine) Mauricia Area, MD as Consulting Physician (Nephrology) Deneise Lever, MD as Consulting Physician (Pulmonary Disease) Laurence Spates, MD as Consulting Physician (Gastroenterology) Josue Hector, MD as Consulting Physician (Cardiology) Naoma Diener, MD (Neurology) Standley Brooking, RN as [place], LCSW as [place] any recent [place] you may have received from other than [facility] providers in the past year (date may be approximate).     Assessment:   This is a routine wellness examination for [place]. Physical assessment deferred to PCP.   Hearing/Vision screen  Visual Acuity Screening   Right eye Left eye Both eyes  Without correction:     With correction: 20/20 20/20 20/20    Dietary issues and exercise activities discussed: Current Exercise Habits: Home exercise routine, Time (Minutes): 15, Frequency (Times/Week): 3, Weekly Exercise (Minutes/Week): 45 Diet (meal  preparation, eat out, water intake, caffeinated beverages, dairy products, fruits and vegetables): in general, a "healthy" diet      Goals    None     Depression Screen PHQ 2/9 Scores 07/22/2016 07/14/2016 06/26/2015  PHQ - 2 Score 0 0 0    Fall Risk Fall Risk  07/22/2016 07/14/2016 06/26/2015  Falls in the past year? No No No  Risk for fall due to : - Impaired mobility -    Cognitive Function: MMSE - Mini Mental State Exam 07/22/2016  Orientation to time 5  Orientation to Place 5  Registration 3  Attention/ Calculation 5  Recall 2  Language- name 2 objects 2  Language- repeat 1  Language- follow 3 step command 3  Language- read & follow direction 1  Write a sentence 1  Copy design 1  Total score 29        Screening Tests Health Maintenance  Topic Date Due  . INFLUENZA VACCINE  01/17/2017 (Originally 12/08/2015)  . TETANUS/TDAP  01/17/2017 (Originally [birth year]/1962)  . PNA vac Low Risk Adult (2 of 2 - PCV13) 01/17/2017 (Originally 12/21/2011)  . MAMMOGRAM  08/20/2017  . COLONOSCOPY  08/31/2023  . DEXA SCAN  Completed      Plan:    Continue to eat heart healthy diet (full of fruits, vegetables, whole grains, lean protein, water--limit salt, fat, and sugar intake) and increase physical activity as tolerated.  Continue doing brain stimulating activities (puzzles, reading, adult coloring books, staying active) to keep memory sharp.   Bring a copy of your advance directives to your next office visit.   During the course of the visit, Liddie was educated and counseled about the following appropriate screening and preventive services:   Vaccines to include Pneumoccal, Influenza, Hepatitis B, Td, Zostavax, HCV  Cardiovascular disease screening  Colorectal cancer screening  Bone density screening  Diabetes screening  Glaucoma screening  Mammography/PAP  Nutrition counseling   Patient Instructions (the written plan) were given to the patient.    Shela Nevin, [place]   07/22/2016

## 2016-07-20 NOTE — Progress Notes (Signed)
Pre visit review using our clinic review tool, if applicable. No additional management support is needed unless otherwise documented below in the visit note. 

## 2016-07-21 ENCOUNTER — Encounter (HOSPITAL_COMMUNITY)
Admission: RE | Admit: 2016-07-21 | Discharge: 2016-07-21 | Disposition: A | Discharge status: Home / Self Care | Payer: Medicare Other | Source: Ambulatory Visit | Attending: Nephrology | Admitting: Nephrology

## 2016-07-21 DIAGNOSIS — D631 Anemia in chronic kidney disease: Secondary | ICD-10-CM | POA: Diagnosis not present

## 2016-07-21 DIAGNOSIS — N183 Chronic kidney disease, stage 3 (moderate): Secondary | ICD-10-CM | POA: Diagnosis not present

## 2016-07-21 DIAGNOSIS — N184 Chronic kidney disease, stage 4 (severe): Secondary | ICD-10-CM

## 2016-07-21 LAB — RENAL FUNCTION PANEL
Albumin: 3.9 g/dL (ref 3.5–5.0)
Anion gap: 7 (ref 5–15)
BUN: 46 mg/dL — AB (ref 6–20)
CALCIUM: 9.3 mg/dL (ref 8.9–10.3)
CHLORIDE: 108 mmol/L (ref 101–111)
CO2: 23 mmol/L (ref 22–32)
CREATININE: 1.66 mg/dL — AB (ref 0.44–1.00)
GFR, EST AFRICAN AMERICAN: 34 mL/min — AB (ref >60)
GFR, EST NON AFRICAN AMERICAN: 30 mL/min — AB (ref >60)
Glucose, Bld: 91 mg/dL (ref 65–99)
Phosphorus: 4.1 mg/dL (ref 2.5–4.6)
Potassium: 4.6 mmol/L (ref 3.5–5.1)
SODIUM: 138 mmol/L (ref 135–145)

## 2016-07-21 LAB — FERRITIN: FERRITIN: 996 ng/mL — AB (ref 11–307)

## 2016-07-21 LAB — PROTIME-INR
INR: 1.37
PROTHROMBIN TIME: 17 s — AB (ref 11.4–15.2)

## 2016-07-21 LAB — IRON AND TIBC
IRON: 131 ug/dL (ref 28–170)
Saturation Ratios: 62 % — ABNORMAL HIGH (ref 10.4–31.8)
TIBC: 211 ug/dL — AB (ref 250–450)
UIBC: 80 ug/dL

## 2016-07-21 MED ORDER — EPOETIN ALFA 40000 UNIT/ML IJ SOLN
INTRAMUSCULAR | Status: AC
  Filled 2016-07-21: qty 1

## 2016-07-21 MED ORDER — EPOETIN ALFA 10000 UNIT/ML IJ SOLN
40000.0000 [IU] | INTRAMUSCULAR | Status: DC
  Administered 2016-07-21: 40000 [IU] via SUBCUTANEOUS

## 2016-07-22 ENCOUNTER — Encounter: Payer: Self-pay | Admitting: *Deleted

## 2016-07-22 ENCOUNTER — Ambulatory Visit (INDEPENDENT_AMBULATORY_CARE_PROVIDER_SITE_OTHER): Payer: Medicare Other | Admitting: *Deleted

## 2016-07-22 VITALS — BP 126/70 | HR 95 | Ht 63.0 in | Wt 137.0 lb

## 2016-07-22 DIAGNOSIS — Z0000 Encounter for general adult medical examination without abnormal findings: Secondary | ICD-10-CM

## 2016-07-22 DIAGNOSIS — Z1239 Encounter for other screening for malignant neoplasm of breast: Secondary | ICD-10-CM

## 2016-07-22 LAB — POCT HEMOGLOBIN-HEMACUE: HEMOGLOBIN: 11.2 g/dL — AB (ref 12.0–15.0)

## 2016-07-22 MED FILL — Epoetin Alfa Inj 40000 Unit/ML: INTRAMUSCULAR | Qty: 1 | Status: AC

## 2016-07-22 NOTE — Progress Notes (Signed)
Reviewed  Yvonne R Lowne Chase, DO  

## 2016-07-22 NOTE — Patient Instructions (Signed)
Continue to eat heart healthy diet (full of fruits, vegetables, whole grains, lean protein, water--limit salt, fat, and sugar intake) and increase physical activity as tolerated.  Continue doing brain stimulating activities (puzzles, reading, adult coloring books, staying active) to keep memory sharp.   Bring a copy of your advance directives to your next office visit.Low-Purine Diet Purines are compounds that affect the level of uric acid in your body. A low-purine diet is a diet that is low in purines. Eating a low-purine diet can prevent the level of uric acid in your body from getting too high and causing gout or kidney stones or both. What do I need to know about this diet?  Choose low-purine foods. Examples of low-purine foods are listed in the next section.  Drink plenty of fluids, especially water. Fluids can help remove uric acid from your body. Try to drink 8-16 cups (1.9-3.8 L) a day.  Limit foods high in fat, especially saturated fat, as fat makes it harder for the body to get rid of uric acid. Foods high in saturated fat include pizza, cheese, ice cream, whole milk, fried foods, and gravies. Choose foods that are lower in fat and lean sources of protein. Use olive oil when cooking as it contains healthy fats that are not high in saturated fat.  Limit alcohol. Alcohol interferes with the elimination of uric acid from your body. If you are having a gout attack, avoid all alcohol.  Keep in mind that different people's bodies react differently to different foods. You will probably learn over time which foods do or do not affect you. If you discover that a food tends to cause your gout to flare up, avoid eating that food. You can more freely enjoy foods that do not cause problems. If you have any questions about a food item, talk to your dietitian or health care provider. Which foods are low, moderate, and high in purines? The following is a list of foods that are low, moderate, and high in  purines. You can eat any amount of the foods that are low in purines. You may be able to have small amounts of foods that are moderate in purines. Ask your health care provider how much of a food moderate in purines you can have. Avoid foods high in purines. Grains   Foods low in purines: Enriched white bread, pasta, rice, cake, cornbread, popcorn.  Foods moderate in purines: Whole-grain breads and cereals, wheat germ, bran, oatmeal. Uncooked oatmeal. Dry wheat bran or wheat germ.  Foods high in purines: Pancakes, [name] toast, biscuits, muffins. Vegetables   Foods low in purines: All vegetables, except those that are moderate in purines.  Foods moderate in purines: Asparagus, cauliflower, spinach, mushrooms, green peas. Fruits   All fruits are low in purines. Meats and other Protein Foods   Foods low in purines: Eggs, nuts, peanut butter.  Foods moderate in purines: 80-90% lean beef, lamb, veal, pork, poultry, fish, eggs, peanut butter, nuts. Crab, lobster, oysters, and shrimp. Cooked dried beans, peas, and lentils.  Foods high in purines: Anchovies, sardines, herring, mussels, tuna, codfish, scallops, trout, and haddock. Berniece Salines. Organ meats (such as liver or kidney). [place]. Game meat. Goose. Sweetbreads. Dairy   All dairy foods are low in purines. Low-fat and fat-free dairy products are best because they are low in saturated fat. Beverages   Drinks low in purines: Water, carbonated beverages, tea, coffee, cocoa.  Drinks moderate in purines: Soft drinks and other drinks sweetened with high-fructose corn syrup.  Juices. To find whether a food or drink is sweetened with high-fructose corn syrup, look at the ingredients list.  Drinks high in purines: Alcoholic beverages (such as beer). Condiments   Foods low in purines: Salt, herbs, olives, pickles, relishes, vinegar.  Foods moderate in purines: Butter, margarine, oils, mayonnaise. Fats and Oils   Foods low in purines: All types,  except gravies and sauces made with meat.  Foods high in purines: Gravies and sauces made with meat. Other Foods   Foods low in purines: Sugars, sweets, gelatin. Cake. Soups made without meat.  Foods moderate in purines: Meat-based or fish-based soups, broths, or bouillons. Foods and drinks sweetened with high-fructose corn syrup.  Foods high in purines: High-fat desserts (such as ice cream, cookies, cakes, pies, doughnuts, and chocolate). Contact your dietitian for more information on foods that are not listed here.  This information is not intended to replace advice given to you by your health care provider. Make sure you discuss any questions you have with your health care provider. Document Released: 08/20/2010 Document Revised: 10/01/2015 Document Reviewed: 04/01/2013 Elsevier Interactive Patient Education  2017 [employer].

## 2016-07-25 ENCOUNTER — Other Ambulatory Visit: Payer: Self-pay | Admitting: *Deleted

## 2016-07-25 NOTE — Patient Outreach (Signed)
[place] [facility]) Care Management  07/25/2016  [name] Apr 18, 1941 [id number]   CSW made a second attempt to try and contact patient today to perform the initial phone assessment, as well as assess and assist with social needs and services, without success.  A HIPAA complaint message was left for patient on voicemail.  CSW is currently awaiting a return call.  If CSW does not receive a return call from patient within the next week, CSW will make a third and final outreach attempt. Nat Christen, BSW, MSW, LCSW  Licensed Education officer, environmental Health System  Mailing [place] [address], [place], [place] [address] Physical Address-[address], [place], [place] [address] Toll Free Main # [phone] Fax # [phone] Cell # [phone]  Office # [phone] [email or web address]

## 2016-08-01 ENCOUNTER — Encounter: Payer: Self-pay | Admitting: *Deleted

## 2016-08-01 ENCOUNTER — Other Ambulatory Visit: Payer: Self-pay | Admitting: *Deleted

## 2016-08-01 NOTE — Patient Outreach (Signed)
[facility] [facility]) Care Management  08/01/2016  [name] 1940-06-03 [id number]   CSW made a third and final attempt to try and contact patient today to perform phone assessment, as well as assess and assist with social work needs and services, without success.  A HIPAA compliant message was left for patient on voicemail.  CSW  continues to await a return call.  CSW will mail an outreach letter to patient's home, encouraging patient to contact CSW at their earliest convenience, if patient is interested in receiving social work services through [place] with [facility].  If CSW does not receive a return call from patient within the next 10 business days, CSW will proceed with case closure.  Required number of phone attempts will have been made and outreach letter mailed.   [name], BSW, MSW, LCSW  Licensed Education officer, environmental Health System  Mailing [place] [address], [place], [place] [address] Physical Address-[address], [place], [place] [address] Toll Free Main # [phone] Fax # [phone] Cell # [phone]  Office # [phone] [name]

## 2016-08-06 ENCOUNTER — Inpatient Hospital Stay (HOSPITAL_BASED_OUTPATIENT_CLINIC_OR_DEPARTMENT_OTHER)
Admission: EM | Admit: 2016-08-06 | Discharge: 2016-08-08 | DRG: 386 | Disposition: A | Discharge status: Home / Self Care | Payer: Medicare Other | Attending: Internal Medicine | Admitting: Internal Medicine

## 2016-08-06 ENCOUNTER — Emergency Department (HOSPITAL_BASED_OUTPATIENT_CLINIC_OR_DEPARTMENT_OTHER): Payer: Medicare Other

## 2016-08-06 ENCOUNTER — Encounter (HOSPITAL_BASED_OUTPATIENT_CLINIC_OR_DEPARTMENT_OTHER): Payer: Self-pay | Admitting: Emergency Medicine

## 2016-08-06 DIAGNOSIS — K219 Gastro-esophageal reflux disease without esophagitis: Secondary | ICD-10-CM | POA: Diagnosis not present

## 2016-08-06 DIAGNOSIS — F419 Anxiety disorder, unspecified: Secondary | ICD-10-CM | POA: Diagnosis not present

## 2016-08-06 DIAGNOSIS — Z940 Kidney transplant status: Secondary | ICD-10-CM

## 2016-08-06 DIAGNOSIS — M329 Systemic lupus erythematosus, unspecified: Secondary | ICD-10-CM | POA: Diagnosis present

## 2016-08-06 DIAGNOSIS — K5090 Crohn's disease, unspecified, without complications: Secondary | ICD-10-CM | POA: Diagnosis present

## 2016-08-06 DIAGNOSIS — I4891 Unspecified atrial fibrillation: Secondary | ICD-10-CM | POA: Diagnosis present

## 2016-08-06 DIAGNOSIS — K56609 Unspecified intestinal obstruction, unspecified as to partial versus complete obstruction: Secondary | ICD-10-CM

## 2016-08-06 DIAGNOSIS — Z961 Presence of intraocular lens: Secondary | ICD-10-CM | POA: Diagnosis present

## 2016-08-06 DIAGNOSIS — R1031 Right lower quadrant pain: Secondary | ICD-10-CM | POA: Diagnosis not present

## 2016-08-06 DIAGNOSIS — Z880 Allergy status to penicillin: Secondary | ICD-10-CM

## 2016-08-06 DIAGNOSIS — E039 Hypothyroidism, unspecified: Secondary | ICD-10-CM | POA: Diagnosis present

## 2016-08-06 DIAGNOSIS — Z9049 Acquired absence of other specified parts of digestive tract: Secondary | ICD-10-CM

## 2016-08-06 DIAGNOSIS — I700 Atherosclerosis of aorta: Secondary | ICD-10-CM | POA: Diagnosis not present

## 2016-08-06 DIAGNOSIS — Z86718 Personal history of other venous thrombosis and embolism: Secondary | ICD-10-CM

## 2016-08-06 DIAGNOSIS — M1990 Unspecified osteoarthritis, unspecified site: Secondary | ICD-10-CM | POA: Diagnosis present

## 2016-08-06 DIAGNOSIS — Z85528 Personal history of other malignant neoplasm of kidney: Secondary | ICD-10-CM

## 2016-08-06 DIAGNOSIS — Z7901 Long term (current) use of anticoagulants: Secondary | ICD-10-CM

## 2016-08-06 DIAGNOSIS — I480 Paroxysmal atrial fibrillation: Secondary | ICD-10-CM | POA: Diagnosis not present

## 2016-08-06 DIAGNOSIS — Z7952 Long term (current) use of systemic steroids: Secondary | ICD-10-CM

## 2016-08-06 DIAGNOSIS — I10 Essential (primary) hypertension: Secondary | ICD-10-CM | POA: Diagnosis present

## 2016-08-06 DIAGNOSIS — N183 Chronic kidney disease, stage 3 unspecified: Secondary | ICD-10-CM | POA: Diagnosis present

## 2016-08-06 DIAGNOSIS — I129 Hypertensive chronic kidney disease with stage 1 through stage 4 chronic kidney disease, or unspecified chronic kidney disease: Secondary | ICD-10-CM | POA: Diagnosis present

## 2016-08-06 DIAGNOSIS — Z888 Allergy status to other drugs, medicaments and biological substances status: Secondary | ICD-10-CM

## 2016-08-06 DIAGNOSIS — Z531 Procedure and treatment not carried out because of patient's decision for reasons of belief and group pressure: Secondary | ICD-10-CM | POA: Diagnosis present

## 2016-08-06 DIAGNOSIS — Z8249 Family history of ischemic heart disease and other diseases of the circulatory system: Secondary | ICD-10-CM

## 2016-08-06 DIAGNOSIS — M109 Gout, unspecified: Secondary | ICD-10-CM | POA: Diagnosis present

## 2016-08-06 DIAGNOSIS — Z881 Allergy status to other antibiotic agents status: Secondary | ICD-10-CM

## 2016-08-06 DIAGNOSIS — G4733 Obstructive sleep apnea (adult) (pediatric): Secondary | ICD-10-CM | POA: Diagnosis present

## 2016-08-06 DIAGNOSIS — K439 Ventral hernia without obstruction or gangrene: Secondary | ICD-10-CM | POA: Diagnosis present

## 2016-08-06 DIAGNOSIS — Z79899 Other long term (current) drug therapy: Secondary | ICD-10-CM

## 2016-08-06 DIAGNOSIS — Z905 Acquired absence of kidney: Secondary | ICD-10-CM

## 2016-08-06 LAB — COMPREHENSIVE METABOLIC PANEL
ALK PHOS: 169 U/L — AB (ref 38–126)
ALT: 12 U/L — AB (ref 14–54)
AST: 19 U/L (ref 15–41)
Albumin: 4.2 g/dL (ref 3.5–5.0)
Anion gap: 9 (ref 5–15)
BILIRUBIN TOTAL: 0.6 mg/dL (ref 0.3–1.2)
BUN: 47 mg/dL — AB (ref 6–20)
CALCIUM: 9.6 mg/dL (ref 8.9–10.3)
CHLORIDE: 105 mmol/L (ref 101–111)
CO2: 22 mmol/L (ref 22–32)
CREATININE: 1.7 mg/dL — AB (ref 0.44–1.00)
GFR, EST AFRICAN AMERICAN: 33 mL/min — AB (ref >60)
GFR, EST NON AFRICAN AMERICAN: 29 mL/min — AB (ref >60)
Glucose, Bld: 154 mg/dL — ABNORMAL HIGH (ref 65–99)
Potassium: 5.3 mmol/L — ABNORMAL HIGH (ref 3.5–5.1)
Sodium: 136 mmol/L (ref 135–145)
Total Protein: 7.4 g/dL (ref 6.5–8.1)

## 2016-08-06 LAB — URINALYSIS, ROUTINE W REFLEX MICROSCOPIC
BILIRUBIN URINE: NEGATIVE
Glucose, UA: NEGATIVE mg/dL
Hgb urine dipstick: NEGATIVE
KETONES UR: NEGATIVE mg/dL
NITRITE: NEGATIVE
Protein, ur: NEGATIVE mg/dL
SPECIFIC GRAVITY, URINE: 1.012 (ref 1.005–1.030)
pH: 6.5 (ref 5.0–8.0)

## 2016-08-06 LAB — CBC WITH DIFFERENTIAL/PLATELET
BASOS PCT: 0 %
Basophils Absolute: 0 10*3/uL (ref 0.0–0.1)
EOS ABS: 0.2 10*3/uL (ref 0.0–0.7)
Eosinophils Relative: 1 %
HCT: 41.7 % (ref 36.0–46.0)
Hemoglobin: 12.9 g/dL (ref 12.0–15.0)
LYMPHS ABS: 1.3 10*3/uL (ref 0.7–4.0)
Lymphocytes Relative: 10 %
MCH: 29.5 pg (ref 26.0–34.0)
MCHC: 30.9 g/dL (ref 30.0–36.0)
MCV: 95.2 fL (ref 78.0–100.0)
MONOS PCT: 5 %
Monocytes Absolute: 0.6 10*3/uL (ref 0.1–1.0)
NEUTROS PCT: 84 %
Neutro Abs: 10.3 10*3/uL — ABNORMAL HIGH (ref 1.7–7.7)
PLATELETS: 156 10*3/uL (ref 150–400)
RBC: 4.38 MIL/uL (ref 3.87–5.11)
RDW: 16.2 % — ABNORMAL HIGH (ref 11.5–15.5)
WBC: 12.4 10*3/uL — AB (ref 4.0–10.5)

## 2016-08-06 LAB — PROTIME-INR
INR: 1.24
PROTHROMBIN TIME: 15.7 s — AB (ref 11.4–15.2)

## 2016-08-06 LAB — NO BLOOD PRODUCTS

## 2016-08-06 LAB — OCCULT BLOOD X 1 CARD TO LAB, STOOL: Fecal Occult Bld: NEGATIVE

## 2016-08-06 LAB — URINALYSIS, MICROSCOPIC (REFLEX)

## 2016-08-06 LAB — LIPASE, BLOOD: Lipase: 31 U/L (ref 11–51)

## 2016-08-06 MED ORDER — MORPHINE SULFATE (PF) 2 MG/ML IV SOLN: 1.0000 mg | INTRAVENOUS | Status: DC | PRN

## 2016-08-06 MED ORDER — ONDANSETRON HCL 4 MG/2ML IJ SOLN
4.0000 mg | Freq: Once | INTRAMUSCULAR | Status: AC
  Administered 2016-08-06: 4 mg via INTRAVENOUS
  Filled 2016-08-06: qty 2

## 2016-08-06 MED ORDER — FLUTICASONE PROPIONATE 50 MCG/ACT NA SUSP
2.0000 | Freq: Every day | NASAL | Status: DC | PRN
  Filled 2016-08-06: qty 16

## 2016-08-06 MED ORDER — PROMETHAZINE HCL 25 MG/ML IJ SOLN
12.5000 mg | Freq: Once | INTRAMUSCULAR | Status: AC
Start: 2016-08-06 — End: 2016-08-06
  Administered 2016-08-06: 12.5 mg via INTRAVENOUS
  Filled 2016-08-06: qty 1

## 2016-08-06 MED ORDER — METHYLPREDNISOLONE SODIUM SUCC 40 MG IJ SOLR
40.0000 mg | Freq: Every day | INTRAMUSCULAR | Status: DC
  Administered 2016-08-06 – 2016-08-08 (×3): 40 mg via INTRAVENOUS
  Filled 2016-08-06 (×3): qty 1

## 2016-08-06 MED ORDER — LEVOTHYROXINE SODIUM 100 MCG IV SOLR
50.0000 ug | Freq: Every day | INTRAVENOUS | Status: DC
  Administered 2016-08-06 – 2016-08-08 (×3): 50 ug via INTRAVENOUS
  Filled 2016-08-06 (×3): qty 5

## 2016-08-06 MED ORDER — ENOXAPARIN SODIUM 60 MG/0.6ML ~~LOC~~ SOLN
1.0000 mg/kg | SUBCUTANEOUS | Status: DC
  Administered 2016-08-06: 60 mg via SUBCUTANEOUS
  Filled 2016-08-06: qty 0.6

## 2016-08-06 MED ORDER — METOPROLOL TARTRATE 5 MG/5ML IV SOLN
10.0000 mg | Freq: Four times a day (QID) | INTRAVENOUS | Status: DC
  Administered 2016-08-06 – 2016-08-07 (×4): 10 mg via INTRAVENOUS
  Filled 2016-08-06 (×4): qty 10

## 2016-08-06 MED ORDER — ONDANSETRON HCL 4 MG/2ML IJ SOLN: 4.0000 mg | Freq: Four times a day (QID) | INTRAMUSCULAR | Status: DC | PRN

## 2016-08-06 MED ORDER — PROMETHAZINE HCL 25 MG/ML IJ SOLN: 12.5000 mg | Freq: Four times a day (QID) | INTRAMUSCULAR | Status: DC | PRN

## 2016-08-06 MED ORDER — FENTANYL CITRATE (PF) 100 MCG/2ML IJ SOLN
50.0000 ug | INTRAMUSCULAR | Status: DC | PRN
  Administered 2016-08-06 (×3): 50 ug via INTRAVENOUS
  Filled 2016-08-06 (×3): qty 2

## 2016-08-06 MED ORDER — SODIUM CHLORIDE 0.9 % IV SOLN
INTRAVENOUS | Status: DC
  Administered 2016-08-06 – 2016-08-08 (×5): via INTRAVENOUS

## 2016-08-06 MED ORDER — SODIUM CHLORIDE 0.9 % IV SOLN
Freq: Once | INTRAVENOUS | Status: AC
Start: 2016-08-06 — End: 2016-08-06
  Administered 2016-08-06: 07:00:00 via INTRAVENOUS

## 2016-08-06 NOTE — ED Notes (Addendum)
Report given to Ramond Dial, receiving nurse at [facility]. Instructed on NPO

## 2016-08-06 NOTE — ED Triage Notes (Signed)
Pt presents to ED with complaints of right side abdominal pain that started last night. PT states she had one bloody stool last night and some nausea.

## 2016-08-06 NOTE — ED Provider Notes (Signed)
7:40 AM patient resting comfortably. Feels much improved after treatment with intravenous Phenergan. She appears in no distress. Denies complaint presently. Dr. Aggie Moats from hospitalist service consulted. Accepts patient in transfer Results for orders placed or performed during the hospital encounter of 08/06/16  Lipase, blood  Result Value Ref Range   Lipase 31 11 - 51 U/L  Comprehensive metabolic panel  Result Value Ref Range   Sodium 136 135 - 145 mmol/L   Potassium 5.3 (H) 3.5 - 5.1 mmol/L   Chloride 105 101 - 111 mmol/L   CO2 22 22 - 32 mmol/L   Glucose, Bld 154 (H) 65 - 99 mg/dL   BUN 47 (H) 6 - 20 mg/dL   Creatinine, Ser 1.70 (H) 0.44 - 1.00 mg/dL   Calcium 9.6 8.9 - 10.3 mg/dL   Total Protein 7.4 6.5 - 8.1 g/dL   Albumin 4.2 3.5 - 5.0 g/dL   AST 19 15 - 41 U/L   ALT 12 (L) 14 - 54 U/L   Alkaline Phosphatase 169 (H) 38 - 126 U/L   Total Bilirubin 0.6 0.3 - 1.2 mg/dL   GFR calc non Af Amer 29 (L) >60 mL/min   GFR calc Af Amer 33 (L) >60 mL/min   Anion gap 9 5 - 15  Urinalysis, Routine w reflex microscopic  Result Value Ref Range   Color, Urine YELLOW YELLOW   APPearance CLEAR CLEAR   Specific Gravity, Urine 1.012 1.005 - 1.030   pH 6.5 5.0 - 8.0   Glucose, UA NEGATIVE NEGATIVE mg/dL   Hgb urine dipstick NEGATIVE NEGATIVE   Bilirubin Urine NEGATIVE NEGATIVE   Ketones, ur NEGATIVE NEGATIVE mg/dL   Protein, ur NEGATIVE NEGATIVE mg/dL   Nitrite NEGATIVE NEGATIVE   Leukocytes, UA TRACE (A) NEGATIVE  CBC with Differential  Result Value Ref Range   WBC 12.4 (H) 4.0 - 10.5 K/uL   RBC 4.38 3.87 - 5.11 MIL/uL   Hemoglobin 12.9 12.0 - 15.0 g/dL   HCT 41.7 36.0 - 46.0 %   MCV 95.2 78.0 - 100.0 fL   MCH 29.5 26.0 - 34.0 pg   MCHC 30.9 30.0 - 36.0 g/dL   RDW 16.2 (H) 11.5 - 15.5 %   Platelets 156 150 - 400 K/uL   Neutrophils Relative % 84 %   Neutro Abs 10.3 (H) 1.7 - 7.7 K/uL   Lymphocytes Relative 10 %   Lymphs Abs 1.3 0.7 - 4.0 K/uL   Monocytes Relative 5 %   Monocytes  Absolute 0.6 0.1 - 1.0 K/uL   Eosinophils Relative 1 %   Eosinophils Absolute 0.2 0.0 - 0.7 K/uL   Basophils Relative 0 %   Basophils Absolute 0.0 0.0 - 0.1 K/uL  Protime-INR  Result Value Ref Range   Prothrombin Time 15.7 (H) 11.4 - 15.2 seconds   INR 1.24   Occult blood card to lab, stool  Result Value Ref Range   Fecal Occult Bld NEGATIVE NEGATIVE  Urinalysis, Microscopic (reflex)  Result Value Ref Range   RBC / HPF 0-5 0 - 5 RBC/hpf   WBC, UA 0-5 0 - 5 WBC/hpf   Bacteria, UA RARE (A) NONE SEEN   Squamous Epithelial / LPF 0-5 (A) NONE SEEN   Ct Abdomen Pelvis Wo Contrast  Result Date: 08/06/2016 CLINICAL DATA:  Right lower quadrant pain. History of kidney transplant, Crohn's disease and small-bowel obstruction. EXAM: CT ABDOMEN AND PELVIS WITHOUT CONTRAST TECHNIQUE: Multidetector CT imaging of the abdomen and pelvis was performed following the standard  protocol without IV contrast. COMPARISON:  CT 03/04/2014 FINDINGS: Lower chest: Coronary artery calcifications.  No pleural fluid. Hepatobiliary: Stable linear capsular versus diaphragmatic calcifications. Subcentimeter subcapsular hypodensity in the right lobe of the liver, unchanged from prior exam. Postcholecystectomy. Unchanged biliary dilatation measuring 10 mm at the porta hepatis. No calcified choledocholithiasis. Pancreas: Parenchymal atrophy. No ductal dilatation or inflammation. Spleen: Normal in size. Scattered granuloma. Linear subcapsular calcifications are stable. Adrenals/Urinary Tract: Bilateral nephrectomies. No abnormal soft tissue density in the nephrectomy bed. Normal adrenal glands. Right lower quadrant transplant kidney without transplant hydronephrosis. No transplant perinephric edema. Urinary bladder is physiologically distended. Stomach/Bowel: Small hiatal hernia. Stomach physiologically distended. Progressive small bowel dilatation with dilated pelvic bowel loops measuring 4.9 cm. No definite transition point. More  distal small bowel loops are less dilated but remain fluid filled. Faint mesenteric edema about fluid-filled small bowel in the right abdomen. Mild colonic diverticulosis without acute inflammation. The appendix is not visualized. Vascular/Lymphatic: IVC filter in place. Advanced atherosclerosis of the abdominal aorta and its branches. No aneurysm. Reproductive: Atrophic uterus, normal for age.  No adnexal mass. Other: No ascites or free air. Postsurgical change of the anterior abdominal wall small fat containing upper abdominal ventral abdominal wall hernia. Severely atrophic right lateral abdominal wall musculature. Subcutaneous granulomas. Musculoskeletal: Abnormal appearance of T10-T11 with endplate irregularity, probable erosions and surrounding scleroses, stable from chest CT 12/31/2015. Stable trabecular thickening of posterior elements of L1. Chronic findings about both hips with periarticular calcifications/intra-articular bodies. There are no acute or suspicious osseous abnormalities. IMPRESSION: 1. Dilated fluid-filled small bowel without transition point. This may reflect early small bowel obstruction versus ileus. 2. 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 Post bilateral nephrectomies of native kidneys. 3. Aortic and branch atherosclerosis. Electronically Signed   By: Jeb Levering M.D.   On: 08/06/2016 07:02     Orlie Dakin, MD 08/06/16 [phone]

## 2016-08-06 NOTE — H&P (Signed)
History and Physical    [name] TKW:[id number] DOB: 06-Oct-1940 DOA: 08/06/2016   PCP: Ann Held, DO   Patient coming from/Resides with: Private residence/husband  Admission status: Observation/FLOOR-it may be medically necessary to stay a minimum 2 midnights to rule out impending and/or unexpected changes in physiologic status that may differ from initial evaluation performed in the ER and/or at time of admission therefore consider reevaluation of admission status 24 hours.   Chief Complaint: Abdominal pain with nausea and vomiting  HPI: [name] is a 76 y.o. female with medical history significant for Crohns dz, SLE, HTN, DVT, PAF, renal tx w/ CKD 3 , OSA on CPAP, hypothyroidism presented to the ER with abrupt onset of right-sided abdominal pain associated with nausea and vomiting and 1 blood-streaked stool. CT revealed ileus versus early small bowel obstruction. Patient reports no flatus for greater than 24 hours. She's been having some diarrheal stools since onset of sx's. Patient reports about one week ago stool consistency and quality changed and she was having intermittent diarrhea stools which is not her baseline. FOB was negative for blood.  ED Course:  Vital Signs: BP (!) 148/72 (BP Location: Right Arm)   Pulse 74   Temp 98 F (36.7 C) (Oral)   Resp 18   Ht 5' 3"  (1.6 m)   Wt 61.2 kg (135 lb)   LMP 03/05/2014   SpO2 99%   BMI 23.91 kg/m  CT abdomen and pelvis without contrast: Dilated fluid-filled small bowel without transition zone consistent with ileus versus early small bowel obstruction. No other acute abnormalities Lab data: Sodium 136, potassium 5.3, chloride 105, CO2 22, glucose 154, BUN 47, creatinine 1.7, anion gap 9, alkaline phosphatase 169, LFTs not elevated, white count 12,400 with neutrophils 84% and absolute neutrophils 10.3%, hemoglobin 12.9, platelets 156,000, PT 15.7, INR 1.24, urinalysis not consistent with UTI, FOB neg Medications  and treatments: Zofran 4 mg IV 1, fentanyl 50 g 3 doses, Phenergan 12.5 mg IV 1  Review of Systems:  In addition to the HPI above,  No Fever-chills, myalgias or other constitutional symptoms No Headache, changes with Vision or hearing, new weakness, tingling, numbness in any extremity, dizziness, dysarthria or word finding difficulty, gait disturbance or imbalance, tremors or seizure activity No problems swallowing food or Liquids, indigestion/reflux, choking or coughing while eating, abdominal pain with or after eating No Chest pain, Cough or Shortness of Breath, palpitations, orthopnea or DOE No melena,hematochezia, dark tarry stools, constipation No dysuria, malodorous urine, hematuria or flank pain No new skin rashes, lesions, masses or bruises, No new joint pains, aches, swelling or redness No recent unintentional weight gain or loss No polyuria, polydypsia or polyphagia   Past Medical History:  Diagnosis Date  . Anemia    "when lupus flares up"  . Anxiety   . Arthritis    "in my joints"  . Atrial fibrillation ([place])   . CAP (community acquired pneumonia) 08/20/2014  . Crohn's disease ([address])   . DVT of leg (deep venous thrombosis) ([place]) 2012-8./13/2013   "have had one in each leg now"  . DVT of lower extremity, bilateral ([place])    "have had them in both legs; last one was on the RLE this year" (04/25/2012)  . GERD (gastroesophageal reflux disease)   . Gout 2016  . Hypertension   . Hypothyroidism   . Lower GI bleed 2012  . Numbness and tingling of foot    bilaterally  . OSA on CPAP   .  Refusal of blood transfusion for reasons of conscience 12/20/2011   pt is Jehovah's Witness  . Renal cell carcinoma   . Renal disorder    S/P nephrectomy; dialysis; "working fine now" (12/20/11)  . SLE (systemic lupus erythematosus) ([place])     Past Surgical History:  Procedure Laterality Date  . [place]   "found sluggish bone marrow" (04/25/2012)  . BONE  MARROW BIOPSY  1993; 1995  . CATARACT EXTRACTION W/ INTRAOCULAR LENS  IMPLANT, BILATERAL  2005-2010  . CESAREAN SECTION  1984  . CHOLECYSTECTOMY  1995  . COLECTOMY  1998   "removal of abscess" (04/25/2012)  . COLONOSCOPY N/A 08/30/2013   Procedure: COLONOSCOPY;  Surgeon: Winfield Cunas., MD;  Location: [facility];  Service: Endoscopy;  Laterality: N/A;  . COLOSTOMY  1998  . COLOSTOMY REVERSAL  1999   "6 months after placed" (04/25/2012)  . EXCISIONAL HEMORRHOIDECTOMY    . INSERTION OF DIALYSIS CATHETER  1988-2006   "peritoneal and hemodialysis; had multiple grafts and fistulas; last graft clotted off 2012"  . NEPHRECTOMY TRANSPLANTED ORGAN  2006   bilaterally  . PARATHYROID IMPLANT REMOVAL Left    removed parathyroid from neck and implanted in arm  . VENA CAVA FILTER PLACEMENT  2012    Social History   Social History  . Marital status: Married    Spouse name: N/A  . Number of children: 3  . Years of education: N/A   Occupational History  . retired-bank teller Retired   Social History Main Topics  . Smoking status: Never Smoker  . Smokeless tobacco: Never Used  . Alcohol use No  . Drug use: No  . Sexual activity: Yes    Birth control/ protection: Post-menopausal   Other Topics Concern  . Not on file   Social History Narrative  . No narrative on file    Mobility: Ambulates independently without assistive devices Work history: Not obtained   Allergies  Allergen Reactions  . Amoxicillin Anaphylaxis  . Ampicillin Anaphylaxis  . Ciprofloxacin Swelling and Other (See Comments)    "of my throat"  . Erythromycin Anaphylaxis  . Penicillins Anaphylaxis  . Sulfa Antibiotics Itching  . Sulfamethoxazole-Trimethoprim Itching  . Tetracycline Anaphylaxis  . Labetalol Nausea And Vomiting    Family History  Problem Relation Age of Onset  . Heart disease Mother   . Hypertension      runs in family  . Sleep apnea Sister      Prior to Admission medications     Medication Sig Start Date End Date Taking? Authorizing Provider  acetaminophen (TYLENOL) 325 MG tablet Take 650 mg by mouth every 6 (six) hours as needed for mild pain.   Yes Historical Provider, MD  allopurinol (ZYLOPRIM) 100 MG tablet Take 1 tablet (100 mg total) by mouth 2 (two) times daily. 07/18/16  Yes Yvonne R Lowne Chase, DO  calcitRIOL (ROCALTROL) 0.25 MCG capsule Take 0.25 mcg by mouth daily.    Yes Historical Provider, MD  epoetin alfa (EPOGEN,PROCRIT) 50388 UNIT/ML injection Inject 40,000 Units into the skin every 21 ( twenty-one) days.    Yes Historical Provider, MD  feeding supplement, ENSURE ENLIVE, (ENSURE ENLIVE) LIQD Take 237 mLs by mouth 2 (two) times daily between meals. 08/26/14  Yes Tasrif Ahmed, MD  fluticasone (FLONASE) 50 MCG/ACT nasal spray Place 2 sprays into both nostrils daily. Patient taking differently: Place 2 sprays into both nostrils daily as needed for allergies.  09/25/15  Yes Mackie Pai, PA-C  gabapentin (NEURONTIN) 100 MG capsule Take 2 capsules (200 mg total) by mouth at bedtime. 07/18/16  Yes Yvonne R Lowne Chase, DO  levothyroxine (SYNTHROID, LEVOTHROID) 100 MCG tablet Take 1 tablet (100 mcg total) by mouth daily. 07/18/16  Yes Yvonne R Lowne Chase, DO  MAGNESIUM-OXIDE 400 (241.3 MG) MG tablet Take 400 mg by mouth 2 (two) times daily. 05/19/14  Yes Historical Provider, MD  metoprolol tartrate (LOPRESSOR) 25 MG tablet Take 1 tablet (25 mg total) by mouth 2 (two) times daily. 07/18/16  Yes Yvonne R Lowne Chase, DO  mycophenolate (MYFORTIC) 180 MG EC tablet Take 360 mg by mouth 2 (two) times daily.     Yes Historical Provider, MD  NON FORMULARY 1 each by Other route See admin instructions. CPAP nightly   Yes Historical Provider, MD  omeprazole (PRILOSEC) 20 MG capsule Take 20 mg by mouth daily. 07/20/16  Yes Historical Provider, MD  polyethylene glycol (MIRALAX / GLYCOLAX) packet Take 17 g by mouth 2 (two) times daily as needed for moderate constipation.    Yes  Historical Provider, MD  predniSONE (DELTASONE) 5 MG tablet Take 5 mg by mouth daily.    Yes Historical Provider, MD  tacrolimus (PROGRAF) 1 MG capsule Take 4 mg by mouth 2 (two) times daily.    Yes Historical Provider, MD  warfarin (COUMADIN) 2.5 MG tablet Take 2.5-5 mg by mouth See admin instructions. Take 50m by mouth on Tuesday and Thursday. Take 2.5 mg by mouth on all other days   Yes Historical Provider, MD  HYDROcodone-acetaminophen (NORCO) 5-325 MG tablet Take 1 tablet by mouth every 6 (six) hours as needed for moderate pain. Patient not taking: Reported on 07/22/2016 03/03/16   EMackie Pai PA-C  NONFORMULARY OR COMPOUNDED ITEM Lipid , TSH----  DX hypothyroid, chronic kidney disease, stage 4 01/18/16   YAnn Held DO  NONFORMULARY OR COMPOUNDED ITEM Compression hose-- thigh high  20-30 mm hg  #1 as directed Dx-- varicose veins b/l with pain 01/18/16   YAnn Held DO    Physical Exam: Vitals:   08/06/16 0600 08/06/16 0644 08/06/16 0821 08/06/16 0938  BP: (!) 160/80 (!) 166/99 (!) 157/82 (!) 148/72  Pulse: 73  69 74  Resp:   20 18  Temp:   97.8 F (36.6 C) 98 F (36.7 C)  TempSrc:   Oral Oral  SpO2: 91%  100% 99%  Weight:      Height:          Constitutional: NAD, calm, comfortable Eyes: PERRL, lids and conjunctivae normal ENMT: Mucous membranes are dry. Posterior pharynx clear of any exudate or lesions.Normal dentition.  Neck: normal, supple, no masses, no thyromegaly Respiratory: clear to auscultation bilaterally, no wheezing, no crackles. Normal respiratory effort. No accessory muscle use.  Cardiovascular: Regular rate and rhythm, no murmurs / rubs / gallops. No extremity edema. 2+ pedal pulses. No carotid bruits.  Abdomen: Focal tenderness over suprapubic region without guarding or rebounding, no masses palpated. No hepatosplenomegaly. Hyperactive somewhat high-pitched Bowel sounds positive. Abdomen soft slightly distended, multiple abdominal scars  from previous surgical procedures, no flatus Musculoskeletal: no clubbing / cyanosis. No joint deformity upper and lower extremities. Good ROM, no contractures. Normal muscle tone.  Skin: no rashes, lesions, ulcers. No induration Neurologic: CN 2-12 grossly intact. Sensation intact, DTR normal. Strength 5/5 x all 4 extremities.  Psychiatric: Normal judgment and insight. Alert and oriented x 3. Normal mood.    Labs on Admission: I have personally reviewed  following labs and imaging studies  CBC:  Recent Labs Lab 08/06/16 0424  WBC 12.4*  NEUTROABS 10.3*  HGB 12.9  HCT 41.7  MCV 95.2  PLT 283   Basic Metabolic Panel:  Recent Labs Lab 08/06/16 0424  NA 136  K 5.3*  CL 105  CO2 22  GLUCOSE 154*  BUN 47*  CREATININE 1.70*  CALCIUM 9.6   GFR: Estimated Creatinine Clearance: 24.4 mL/min (A) (by C-G formula based on SCr of 1.7 mg/dL (H)). Liver Function Tests:  Recent Labs Lab 08/06/16 0424  AST 19  ALT 12*  ALKPHOS 169*  BILITOT 0.6  PROT 7.4  ALBUMIN 4.2    Recent Labs Lab 08/06/16 0424  LIPASE 31   No results for input(s): AMMONIA in the last 168 hours. Coagulation Profile:  Recent Labs Lab 08/06/16 0424  INR 1.24   Cardiac Enzymes: No results for input(s): CKTOTAL, CKMB, CKMBINDEX, TROPONINI in the last 168 hours. BNP (last 3 results) No results for input(s): PROBNP in the last 8760 hours. HbA1C: No results for input(s): HGBA1C in the last 72 hours. CBG: No results for input(s): GLUCAP in the last 168 hours. Lipid Profile: No results for input(s): CHOL, HDL, LDLCALC, TRIG, CHOLHDL, LDLDIRECT in the last 72 hours. Thyroid Function Tests: No results for input(s): TSH, T4TOTAL, FREET4, T3FREE, THYROIDAB in the last 72 hours. Anemia Panel: No results for input(s): VITAMINB12, FOLATE, FERRITIN, TIBC, IRON, RETICCTPCT in the last 72 hours. Urine analysis:    Component Value Date/Time   COLORURINE YELLOW 08/06/2016 0424   APPEARANCEUR CLEAR  08/06/2016 0424   LABSPEC 1.012 08/06/2016 0424   PHURINE 6.5 08/06/2016 0424   GLUCOSEU NEGATIVE 08/06/2016 0424   HGBUR NEGATIVE 08/06/2016 0424   BILIRUBINUR NEGATIVE 08/06/2016 0424   BILIRUBINUR neg 09/02/2015 1124   KETONESUR NEGATIVE 08/06/2016 0424   PROTEINUR NEGATIVE 08/06/2016 0424   UROBILINOGEN 0.2 09/02/2015 1124   UROBILINOGEN 0.2 08/23/2014 0156   NITRITE NEGATIVE 08/06/2016 0424   LEUKOCYTESUR TRACE (A) 08/06/2016 0424   Sepsis Labs: @LABRCNTIP (procalcitonin:4,lacticidven:4) )No results found for this or any previous visit (from the past 240 hour(s)).   Radiological Exams on Admission: Ct Abdomen Pelvis Wo Contrast  Result Date: 08/06/2016 CLINICAL DATA:  Right lower quadrant pain. History of kidney transplant, Crohn's disease and small-bowel obstruction. EXAM: CT ABDOMEN AND PELVIS WITHOUT CONTRAST TECHNIQUE: Multidetector CT imaging of the abdomen and pelvis was performed following the standard protocol without IV contrast. COMPARISON:  CT 03/04/2014 FINDINGS: Lower chest: Coronary artery calcifications.  No pleural fluid. Hepatobiliary: Stable linear capsular versus diaphragmatic calcifications. Subcentimeter subcapsular hypodensity in the right lobe of the liver, unchanged from prior exam. Postcholecystectomy. Unchanged biliary dilatation measuring 10 mm at the porta hepatis. No calcified choledocholithiasis. Pancreas: Parenchymal atrophy. No ductal dilatation or inflammation. Spleen: Normal in size. Scattered granuloma. Linear subcapsular calcifications are stable. Adrenals/Urinary Tract: Bilateral nephrectomies. No abnormal soft tissue density in the nephrectomy bed. Normal adrenal glands. Right lower quadrant transplant kidney without transplant hydronephrosis. No transplant perinephric edema. Urinary bladder is physiologically distended. Stomach/Bowel: Small hiatal hernia. Stomach physiologically distended. Progressive small bowel dilatation with dilated pelvic bowel  loops measuring 4.9 cm. No definite transition point. More distal small bowel loops are less dilated but remain fluid filled. Faint mesenteric edema about fluid-filled small bowel in the right abdomen. Mild colonic diverticulosis without acute inflammation. The appendix is not visualized. Vascular/Lymphatic: IVC filter in place. Advanced atherosclerosis of the abdominal aorta and its branches. No aneurysm. Reproductive: Atrophic uterus, normal for age.  No adnexal mass. Other: No ascites or free air. Postsurgical change of the anterior abdominal wall small fat containing upper abdominal ventral abdominal wall hernia. Severely atrophic right lateral abdominal wall musculature. Subcutaneous granulomas. Musculoskeletal: Abnormal appearance of T10-T11 with endplate irregularity, probable erosions and surrounding scleroses, stable from chest CT 12/31/2015. Stable trabecular thickening of posterior elements of L1. Chronic findings about both hips with periarticular calcifications/intra-articular bodies. There are no acute or suspicious osseous abnormalities. IMPRESSION: 1. Dilated fluid-filled small bowel without transition point. This may reflect early small bowel obstruction versus ileus. 2. 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 Post bilateral nephrectomies of native kidneys. 3. Aortic and branch atherosclerosis. Electronically Signed   By: Jeb Levering M.D.   On: 08/06/2016 07:02     Assessment/Plan Principal Problem:   SBO (small bowel obstruction) -Presents with acute onset of abdominal pain with nausea, vomiting and diarrhea without flatus consistent with early SBO -Abdomen soft, no recurrent emesis-no indication for NG tube -No NG tube indicated therefore will not institute SBO order set-  If sx's worsen/develops need for NG tube can add these orders -NPO w/ ice chips -Zofran and Phenergan for nausea -Low-dose morphine for severe abdominal pain -Encourage  mobilization -NS IVFs @150 /hr -2 view abdominal films in a.m. 4/1  Active Problems:   CKD (chronic kidney disease) stage 3, GFR 30-59 ml/min -Renal function stable and at baseline: 47 and 1.7    Crohn's disease  -Patient reports current symptoms not consistent with Crohn's flare -Solu-Medrol daily to replace prednisone    Atrial fibrillation  -Rate controlled on beta blocker -Scheduled IV Lopressor -CHADVASc=5 -Full dose Lovenox to replace warfarin    History of renal transplant -Solu-Medrol as above -Mycophenolate and tacrolimus on hold until able to tolerate POs    HTN (hypertension) -Current blood pressure controlled -IV Lopressor    History of DVT (deep vein thrombosis) -Lovenox as above    Hypothyroidism -IV Synthroid-use half PO dose    SLE (systemic lupus erythematosus)  -Patient denies symptoms consistent with flare -Solu-Medrol as above    OSA (obstructive sleep apnea) -Internal CPAP      DVT prophylaxis: Full dose Lovenox until can resume warfarin  Code Status: Full Family Communication: No family at bedside Disposition Plan: Home Consults called: None    ELLIS,ALLISON L. ANP-BC [facility] Pager [phone]   If 7PM-7AM, please contact night-coverage [email or web address] Password [facility]  08/06/2016, 10:41 AM

## 2016-08-06 NOTE — Progress Notes (Signed)
Pt has home CPAP set up at bedside with everything that she needs. She will place herself on later tonight.

## 2016-08-06 NOTE — ED Notes (Signed)
PT returned from CT

## 2016-08-06 NOTE — ED Provider Notes (Signed)
[place] DEPT [facility] Provider Note: [name], MD, FACEP  CSN: [id number] MRN: [id number] ARRIVAL: 08/06/16 at [place]: [id number]   CHIEF COMPLAINT  Abdominal Pain   HISTORY OF PRESENT ILLNESS  [name] is a 76 y.o. female with a complicated past medical history including kidney transplant, Crohn's disease and lupus. She is here with abdominal pain that began yesterday afternoon. The pain is intermittent and occurs in brief waves. The pain can be severe during these waves. The pain is primarily located in the right lower quadrant and is described as feeling leg gas. She has a known abdominal wall hernia in the right lower quadrant adjacent to her transplanted kidney. She has had nausea and several episodes of vomiting with this. Her most recent episode of vomiting was just prior to arrival. She also had a bowel movement which was of normal color but she noted drops of bright red blood accompanying it.    Past Medical History:  Diagnosis Date  . Anemia    "when lupus flares up"  . Anxiety   . Arthritis    "in my joints"  . Atrial fibrillation ([place])   . CAP (community acquired pneumonia) 08/20/2014  . Crohn's disease ([place])   . DVT of leg (deep venous thrombosis) ([place]) 2012-8./13/2013   "have had one in each leg now"  . DVT of lower extremity, bilateral ([place])    "have had them in both legs; last one was on the RLE this year" (04/25/2012)  . GERD (gastroesophageal reflux disease)   . Gout 2016  . Hypertension   . Hypothyroidism   . Lower GI bleed 2012  . Numbness and tingling of foot    bilaterally  . OSA on CPAP   . Refusal of blood transfusion for reasons of conscience 12/20/2011   pt is Jehovah's Witness  . Renal cell carcinoma   . Renal disorder    S/P nephrectomy; dialysis; "working fine now" (12/20/11)  . SLE (systemic lupus erythematosus) ([place])     Past Surgical History:  Procedure Laterality Date  . [place]   "found  sluggish bone marrow" (04/25/2012)  . BONE MARROW BIOPSY  1993; 1995  . CATARACT EXTRACTION W/ INTRAOCULAR LENS  IMPLANT, BILATERAL  2005-2010  . CESAREAN SECTION  1984  . CHOLECYSTECTOMY  1995  . COLECTOMY  1998   "removal of abscess" (04/25/2012)  . COLONOSCOPY N/A 08/30/2013   Procedure: COLONOSCOPY;  Surgeon: Winfield Cunas., MD;  Location: [facility];  Service: Endoscopy;  Laterality: N/A;  . COLOSTOMY  1998  . COLOSTOMY REVERSAL  1999   "6 months after placed" (04/25/2012)  . EXCISIONAL HEMORRHOIDECTOMY    . INSERTION OF DIALYSIS CATHETER  1988-2006   "peritoneal and hemodialysis; had multiple grafts and fistulas; last graft clotted off 2012"  . NEPHRECTOMY TRANSPLANTED ORGAN  2006   bilaterally  . PARATHYROID IMPLANT REMOVAL Left    removed parathyroid from neck and implanted in arm  . VENA CAVA FILTER PLACEMENT  2012    Family History  Problem Relation Age of Onset  . Heart disease Mother   . Hypertension      runs in family  . Sleep apnea Sister     Social History  Substance Use Topics  . Smoking status: Never Smoker  . Smokeless tobacco: Never Used  . Alcohol use No    Prior to Admission medications   Medication Sig Start Date End Date Taking? Authorizing Provider  acetaminophen (TYLENOL)  325 MG tablet Take 650 mg by mouth every 6 (six) hours as needed for mild pain.   Yes Historical Provider, MD  allopurinol (ZYLOPRIM) 100 MG tablet Take 1 tablet (100 mg total) by mouth 2 (two) times daily. 07/18/16  Yes Yvonne R Lowne Chase, DO  calcitRIOL (ROCALTROL) 0.25 MCG capsule Take 0.25 mcg by mouth daily.    Yes Historical Provider, MD  epoetin alfa (EPOGEN,PROCRIT) 30076 UNIT/ML injection Inject 40,000 Units into the skin every 21 ( twenty-one) days.    Yes Historical Provider, MD  feeding supplement, ENSURE ENLIVE, (ENSURE ENLIVE) LIQD Take 237 mLs by mouth 2 (two) times daily between meals. 08/26/14  Yes Tasrif Ahmed, MD  fluticasone (FLONASE) 50 MCG/ACT nasal  spray Place 2 sprays into both nostrils daily. Patient taking differently: Place 2 sprays into both nostrils daily as needed for allergies.  09/25/15  Yes Edward Saguier, PA-C  gabapentin (NEURONTIN) 100 MG capsule Take 2 capsules (200 mg total) by mouth at bedtime. 07/18/16  Yes Yvonne R Lowne Chase, DO  levothyroxine (SYNTHROID, LEVOTHROID) 100 MCG tablet Take 1 tablet (100 mcg total) by mouth daily. 07/18/16  Yes Yvonne R Lowne Chase, DO  MAGNESIUM-OXIDE 400 (241.3 MG) MG tablet Take 400 mg by mouth 2 (two) times daily. 05/19/14  Yes Historical Provider, MD  metoprolol tartrate (LOPRESSOR) 25 MG tablet Take 1 tablet (25 mg total) by mouth 2 (two) times daily. 07/18/16  Yes Yvonne R Lowne Chase, DO  mycophenolate (MYFORTIC) 180 MG EC tablet Take 360 mg by mouth 2 (two) times daily.     Yes Historical Provider, MD  NON FORMULARY 1 each by Other route See admin instructions. CPAP nightly   Yes Historical Provider, MD  omeprazole (PRILOSEC) 20 MG capsule Take 20 mg by mouth daily. 07/20/16  Yes Historical Provider, MD  polyethylene glycol (MIRALAX / GLYCOLAX) packet Take 17 g by mouth 2 (two) times daily as needed for moderate constipation.    Yes Historical Provider, MD  predniSONE (DELTASONE) 5 MG tablet Take 5 mg by mouth daily.    Yes Historical Provider, MD  tacrolimus (PROGRAF) 1 MG capsule Take 4 mg by mouth 2 (two) times daily.    Yes Historical Provider, MD  warfarin (COUMADIN) 2.5 MG tablet Take 2.5-5 mg by mouth See admin instructions. Take 54m by mouth on Tuesday and Thursday. Take 2.5 mg by mouth on all other days   Yes Historical Provider, MD  HYDROcodone-acetaminophen (NORCO) 5-325 MG tablet Take 1 tablet by mouth every 6 (six) hours as needed for moderate pain. Patient not taking: Reported on 07/22/2016 03/03/16   EMackie Pai PA-C  NONFORMULARY OR COMPOUNDED ITEM Lipid , TSH----  DX hypothyroid, chronic kidney disease, stage 4 01/18/16   YAnn Held DO  NONFORMULARY OR COMPOUNDED  ITEM Compression hose-- thigh high  20-30 mm hg  #1 as directed Dx-- varicose veins b/l with pain 01/18/16   YRosalita ChessmanChase, DO    Allergies Amoxicillin; Ampicillin; Ciprofloxacin; Erythromycin; Penicillins; Sulfa antibiotics; Sulfamethoxazole-trimethoprim; Tetracycline; and Labetalol   REVIEW OF SYSTEMS  Negative except as noted here or in the History of Present Illness.   PHYSICAL EXAMINATION  Initial Vital Signs Blood pressure (!) 176/86, pulse 73, temperature 97.6 F (36.4 C), temperature source Oral, height 5' 3"  (1.6 m), weight 135 lb (61.2 kg), last menstrual period 03/05/2014, SpO2 96 %.  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; decreased bowel sounds Rectal: Scarring and external hemorrhoids; stool on examining glove light brown, no gross blood seen Extremities: No deformity; full range of motion; pulses normal except decreased in left wrist Neurologic: Awake, alert and oriented; motor function intact in all extremities and symmetric; no facial droop Skin: Warm and dry Psychiatric: Normal mood and affect   RESULTS  Summary of this visit's results, reviewed by myself:   EKG Interpretation  Date/Time:    Ventricular Rate:    PR Interval:    QRS Duration:   QT Interval:    QTC Calculation:   R Axis:     Text Interpretation:        Laboratory Studies: Results for orders placed or performed during the hospital encounter of 08/06/16 (from the past 24 hour(s))  Lipase, blood     Status: None   Collection Time: 08/06/16  4:24 AM    Result Value Ref Range   Lipase 31 11 - 51 U/L  Comprehensive metabolic panel     Status: Abnormal   Collection Time: 08/06/16  4:24 AM  Result Value Ref Range   Sodium 136 135 - 145 mmol/L   Potassium 5.3 (H) 3.5 - 5.1 mmol/L   Chloride 105 101 - 111 mmol/L   CO2 22 22 - 32 mmol/L   Glucose, Bld 154 (H) 65 - 99 mg/dL   BUN 47 (H) 6 - 20 mg/dL   Creatinine, Ser 1.70 (H) 0.44 - 1.00 mg/dL   Calcium 9.6 8.9 - 10.3 mg/dL   Total Protein 7.4 6.5 - 8.1 g/dL   Albumin 4.2 3.5 - 5.0 g/dL   AST 19 15 - 41 U/L   ALT 12 (L) 14 - 54 U/L   Alkaline Phosphatase 169 (H) 38 - 126 U/L   Total Bilirubin 0.6 0.3 - 1.2 mg/dL   GFR calc non Af Amer 29 (L) >60 mL/min   GFR calc Af Amer 33 (L) >60 mL/min   Anion gap 9 5 - 15  Urinalysis, Routine w reflex microscopic     Status: Abnormal   Collection Time: 08/06/16  4:24 AM  Result Value Ref Range   Color, Urine YELLOW YELLOW   APPearance CLEAR CLEAR   Specific Gravity, Urine 1.012 1.005 - 1.030   pH 6.5 5.0 - 8.0   Glucose, UA NEGATIVE NEGATIVE mg/dL   Hgb urine dipstick NEGATIVE NEGATIVE   Bilirubin Urine NEGATIVE NEGATIVE   Ketones, ur NEGATIVE NEGATIVE mg/dL   Protein, ur NEGATIVE NEGATIVE mg/dL   Nitrite NEGATIVE NEGATIVE   Leukocytes, UA TRACE (A) NEGATIVE  CBC with Differential     Status: Abnormal   Collection Time: 08/06/16  4:24 AM  Result Value Ref Range   WBC 12.4 (H) 4.0 - 10.5 K/uL   RBC 4.38 3.87 - 5.11 MIL/uL   Hemoglobin 12.9 12.0 - 15.0 g/dL   HCT 41.7 36.0 - 46.0 %   MCV 95.2 78.0 - 100.0 fL   MCH 29.5 26.0 - 34.0 pg   MCHC 30.9 30.0 - 36.0 g/dL   RDW 16.2 (H) 11.5 - 15.5 %   Platelets 156 150 - 400 K/uL   Neutrophils Relative % 84 %   Neutro Abs 10.3 (H) 1.7 - 7.7 K/uL   Lymphocytes Relative 10 %  Lymphs Abs 1.3 0.7 - 4.0 K/uL   Monocytes Relative 5 %   Monocytes Absolute 0.6 0.1 - 1.0 K/uL   Eosinophils Relative 1 %   Eosinophils Absolute 0.2 0.0 - 0.7 K/uL   Basophils Relative 0 %   Basophils Absolute 0.0  0.0 - 0.1 K/uL  Protime-INR     Status: Abnormal   Collection Time: 08/06/16  4:24 AM  Result Value Ref Range   Prothrombin Time 15.7 (H) 11.4 - 15.2 seconds   INR 1.24   Urinalysis, Microscopic (reflex)     Status: Abnormal   Collection Time: 08/06/16  4:24 AM  Result Value Ref Range   RBC / HPF 0-5 0 - 5 RBC/hpf   WBC, UA 0-5 0 - 5 WBC/hpf   Bacteria, UA RARE (A) NONE SEEN   Squamous Epithelial / LPF 0-5 (A) NONE SEEN  Occult blood card to lab, stool     Status: None   Collection Time: 08/06/16  4:30 AM  Result Value Ref Range   Fecal Occult Bld NEGATIVE NEGATIVE  No blood products     Status: None   Collection Time: 08/06/16  1:29 PM  Result Value Ref Range   Transfuse no blood products      TRANSFUSE NO BLOOD PRODUCTS, VERIFIED BY REBECCA GREENAWALT,RN   Imaging Studies: Ct Abdomen Pelvis Wo Contrast  Result Date: 08/06/2016 CLINICAL DATA:  Right lower quadrant pain. History of kidney transplant, Crohn's disease and small-bowel obstruction. EXAM: CT ABDOMEN AND PELVIS WITHOUT CONTRAST TECHNIQUE: Multidetector CT imaging of the abdomen and pelvis was performed following the standard protocol without IV contrast. COMPARISON:  CT 03/04/2014 FINDINGS: Lower chest: Coronary artery calcifications.  No pleural fluid. Hepatobiliary: Stable linear capsular versus diaphragmatic calcifications. Subcentimeter subcapsular hypodensity in the right lobe of the liver, unchanged from prior exam. Postcholecystectomy. Unchanged biliary dilatation measuring 10 mm at the porta hepatis. No calcified choledocholithiasis. Pancreas: Parenchymal atrophy. No ductal dilatation or inflammation. Spleen: Normal in size. Scattered granuloma. Linear subcapsular calcifications are stable. Adrenals/Urinary Tract: Bilateral nephrectomies. No abnormal soft tissue density in the nephrectomy bed. Normal adrenal glands. Right lower quadrant transplant kidney without transplant hydronephrosis. No transplant perinephric edema.  Urinary bladder is physiologically distended. Stomach/Bowel: Small hiatal hernia. Stomach physiologically distended. Progressive small bowel dilatation with dilated pelvic bowel loops measuring 4.9 cm. No definite transition point. More distal small bowel loops are less dilated but remain fluid filled. Faint mesenteric edema about fluid-filled small bowel in the right abdomen. Mild colonic diverticulosis without acute inflammation. The appendix is not visualized. Vascular/Lymphatic: IVC filter in place. Advanced atherosclerosis of the abdominal aorta and its branches. No aneurysm. Reproductive: Atrophic uterus, normal for age.  No adnexal mass. Other: No ascites or free air. Postsurgical change of the anterior abdominal wall small fat containing upper abdominal ventral abdominal wall hernia. Severely atrophic right lateral abdominal wall musculature. Subcutaneous granulomas. Musculoskeletal: Abnormal appearance of T10-T11 with endplate irregularity, probable erosions and surrounding scleroses, stable from chest CT 12/31/2015. Stable trabecular thickening of posterior elements of L1. Chronic findings about both hips with periarticular calcifications/intra-articular bodies. There are no acute or suspicious osseous abnormalities. IMPRESSION: 1. Dilated fluid-filled small bowel without transition point. This may reflect early small bowel obstruction versus ileus. 2. 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 Post bilateral nephrectomies of native kidneys. 3. Aortic and branch atherosclerosis. Electronically Signed   By: Jeb Levering M.D.   On: 08/06/2016 07:02    ED COURSE  Nursing notes and  initial vitals signs, including pulse oximetry, reviewed.  Vitals:   08/06/16 0644 08/06/16 0821 08/06/16 0938 08/06/16 1723  BP: (!) 166/99 (!) 157/82 (!) 148/72 140/73  Pulse:  69 74 67  Resp:  20 18 18   Temp:  97.8 F (36.6 C) 98 F (36.7 C) 98 F (36.7 C)  TempSrc:  Oral  Oral Oral  SpO2:  100% 99% 98%  Weight:      Height:       7:13 AM CT scan consistent with small bowel obstruction versus ileus. Patient does have a history of small bowel obstruction in the past. We'll have her admitted to [facility]. IV fluids are infusing. Antiemetics have been administered.  PROCEDURES    ED DIAGNOSES     ICD-9-CM ICD-10-CM   1. Right lower quadrant abdominal pain 789.03 R10.31   2. SBO (small bowel obstruction) 560.9 K56.609 DG Abd 2 Views     DG Abd 2 Views       Shanon Rosser, MD 08/06/16 2237

## 2016-08-06 NOTE — Progress Notes (Signed)
ANTICOAGULATION CONSULT NOTE - Initial Consult  Pharmacy Consult for lovenox Indication: atrial fibrillation  Allergies  Allergen Reactions  . Amoxicillin Anaphylaxis  . Ampicillin Anaphylaxis  . Ciprofloxacin Swelling and Other (See Comments)    "of my throat"  . Erythromycin Anaphylaxis  . Penicillins Anaphylaxis  . Sulfa Antibiotics Itching  . Sulfamethoxazole-Trimethoprim Itching  . Tetracycline Anaphylaxis  . Labetalol Nausea And Vomiting    Patient Measurements: Height: 5' 3"  (160 cm) Weight: 135 lb (61.2 kg) IBW/kg (Calculated) : 52.4  Vital Signs: Temp: 98 F (36.7 C) (03/31 0938) Temp Source: Oral (03/31 0938) BP: 148/72 (03/31 0938) Pulse Rate: 74 (03/31 0938)  Labs:  Recent Labs  08/06/16 0424  HGB 12.9  HCT 41.7  PLT 156  LABPROT 15.7*  INR 1.24  CREATININE 1.70*    Estimated Creatinine Clearance: 24.4 mL/min (A) (by C-G formula based on SCr of 1.7 mg/dL (H)).   Medical History: Past Medical History:  Diagnosis Date  . Anemia    "when lupus flares up"  . Anxiety   . Arthritis    "in my joints"  . Atrial fibrillation ([place])   . CAP (community acquired pneumonia) 08/20/2014  . Crohn's disease ([place])   . DVT of leg (deep venous thrombosis) ([place]) 2012-8./13/2013   "have had one in each leg now"  . DVT of lower extremity, bilateral ([place])    "have had them in both legs; last one was on the RLE this year" (04/25/2012)  . GERD (gastroesophageal reflux disease)   . Gout 2016  . Hypertension   . Hypothyroidism   . Lower GI bleed 2012  . Numbness and tingling of foot    bilaterally  . OSA on CPAP   . Refusal of blood transfusion for reasons of conscience 12/20/2011   pt is Jehovah's Witness  . Renal cell carcinoma   . Renal disorder    S/P nephrectomy; dialysis; "working fine now" (12/20/11)  . SLE (systemic lupus erythematosus) ([place])      Assessment: 7 yoF admitted with n/v and 1 blood-streaked stool that patient states 'was not a lot.'  She is on coumadin PTA for afib (last dose 3/30 at 2130). INR is 1.24 today, pltc 156, and hgb 12.9. Per patient, no bleeding. Pharmacy consulted to start lovenox as bridge while patient is NPO 2/2 SBO. Renal function at baseline, but patient has hx of CKD3.   Per patient: Home dose 334m on TuTh and 2.515mAOD.  Goal of Therapy:  Anti-Xa level 0.6-1 units/ml 4hrs after LMWH dose given Monitor platelets by anticoagulation protocol: Yes   Plan:  Enoxaparin 34m64mg q24h Monitor s/sx of bleeding and anti-xa as indicated CBC q72h  JosDierdre HarnessS, PharmD Clinical Pharmacy Resident [phone]) 08/06/2016 11:52 AM

## 2016-08-06 NOTE — Plan of Care (Addendum)
76yo F PMHX significant for CKD followed by [facility] with hx of Crohn's, kidney transplant and venous thromboembolism who presented to [facility] with N/V and diagnosed with ileus vs SBO on CT exam. Patient has been hemodynamically stable.  Status: Med-surg Obs  Will need gen surgery consult on admit & NGT  Elwin Mocha MD

## 2016-08-07 ENCOUNTER — Observation Stay (HOSPITAL_COMMUNITY): Payer: Medicare Other

## 2016-08-07 DIAGNOSIS — I480 Paroxysmal atrial fibrillation: Secondary | ICD-10-CM | POA: Diagnosis not present

## 2016-08-07 DIAGNOSIS — R1031 Right lower quadrant pain: Secondary | ICD-10-CM | POA: Diagnosis not present

## 2016-08-07 DIAGNOSIS — Z86718 Personal history of other venous thrombosis and embolism: Secondary | ICD-10-CM | POA: Diagnosis not present

## 2016-08-07 DIAGNOSIS — Z940 Kidney transplant status: Secondary | ICD-10-CM | POA: Diagnosis not present

## 2016-08-07 DIAGNOSIS — M329 Systemic lupus erythematosus, unspecified: Secondary | ICD-10-CM | POA: Diagnosis present

## 2016-08-07 DIAGNOSIS — Z9049 Acquired absence of other specified parts of digestive tract: Secondary | ICD-10-CM | POA: Diagnosis not present

## 2016-08-07 DIAGNOSIS — K5090 Crohn's disease, unspecified, without complications: Secondary | ICD-10-CM | POA: Diagnosis not present

## 2016-08-07 DIAGNOSIS — E038 Other specified hypothyroidism: Secondary | ICD-10-CM

## 2016-08-07 DIAGNOSIS — M1990 Unspecified osteoarthritis, unspecified site: Secondary | ICD-10-CM | POA: Diagnosis present

## 2016-08-07 DIAGNOSIS — E039 Hypothyroidism, unspecified: Secondary | ICD-10-CM | POA: Diagnosis present

## 2016-08-07 DIAGNOSIS — I700 Atherosclerosis of aorta: Secondary | ICD-10-CM | POA: Diagnosis not present

## 2016-08-07 DIAGNOSIS — K439 Ventral hernia without obstruction or gangrene: Secondary | ICD-10-CM | POA: Diagnosis present

## 2016-08-07 DIAGNOSIS — Z961 Presence of intraocular lens: Secondary | ICD-10-CM | POA: Diagnosis present

## 2016-08-07 DIAGNOSIS — F419 Anxiety disorder, unspecified: Secondary | ICD-10-CM | POA: Diagnosis present

## 2016-08-07 DIAGNOSIS — M109 Gout, unspecified: Secondary | ICD-10-CM | POA: Diagnosis present

## 2016-08-07 DIAGNOSIS — Z531 Procedure and treatment not carried out because of patient's decision for reasons of belief and group pressure: Secondary | ICD-10-CM | POA: Diagnosis present

## 2016-08-07 DIAGNOSIS — Z79899 Other long term (current) drug therapy: Secondary | ICD-10-CM | POA: Diagnosis not present

## 2016-08-07 DIAGNOSIS — Z8249 Family history of ischemic heart disease and other diseases of the circulatory system: Secondary | ICD-10-CM | POA: Diagnosis not present

## 2016-08-07 DIAGNOSIS — N183 Chronic kidney disease, stage 3 (moderate): Secondary | ICD-10-CM | POA: Diagnosis not present

## 2016-08-07 DIAGNOSIS — Z881 Allergy status to other antibiotic agents status: Secondary | ICD-10-CM | POA: Diagnosis not present

## 2016-08-07 DIAGNOSIS — K56609 Unspecified intestinal obstruction, unspecified as to partial versus complete obstruction: Secondary | ICD-10-CM | POA: Diagnosis not present

## 2016-08-07 DIAGNOSIS — Z85528 Personal history of other malignant neoplasm of kidney: Secondary | ICD-10-CM | POA: Diagnosis not present

## 2016-08-07 DIAGNOSIS — G4733 Obstructive sleep apnea (adult) (pediatric): Secondary | ICD-10-CM

## 2016-08-07 DIAGNOSIS — Z7901 Long term (current) use of anticoagulants: Secondary | ICD-10-CM | POA: Diagnosis not present

## 2016-08-07 DIAGNOSIS — K219 Gastro-esophageal reflux disease without esophagitis: Secondary | ICD-10-CM | POA: Diagnosis present

## 2016-08-07 DIAGNOSIS — I129 Hypertensive chronic kidney disease with stage 1 through stage 4 chronic kidney disease, or unspecified chronic kidney disease: Secondary | ICD-10-CM | POA: Diagnosis present

## 2016-08-07 DIAGNOSIS — Z905 Acquired absence of kidney: Secondary | ICD-10-CM | POA: Diagnosis not present

## 2016-08-07 DIAGNOSIS — Z7952 Long term (current) use of systemic steroids: Secondary | ICD-10-CM | POA: Diagnosis not present

## 2016-08-07 DIAGNOSIS — I4891 Unspecified atrial fibrillation: Secondary | ICD-10-CM | POA: Diagnosis not present

## 2016-08-07 LAB — COMPREHENSIVE METABOLIC PANEL
ALT: 11 U/L — ABNORMAL LOW (ref 14–54)
AST: 17 U/L (ref 15–41)
Albumin: 3.3 g/dL — ABNORMAL LOW (ref 3.5–5.0)
Alkaline Phosphatase: 126 U/L (ref 38–126)
Anion gap: 9 (ref 5–15)
BUN: 40 mg/dL — AB (ref 6–20)
CHLORIDE: 108 mmol/L (ref 101–111)
CO2: 20 mmol/L — ABNORMAL LOW (ref 22–32)
Calcium: 8.7 mg/dL — ABNORMAL LOW (ref 8.9–10.3)
Creatinine, Ser: 1.69 mg/dL — ABNORMAL HIGH (ref 0.44–1.00)
GFR calc Af Amer: 34 mL/min — ABNORMAL LOW (ref >60)
GFR, EST NON AFRICAN AMERICAN: 29 mL/min — AB (ref >60)
Glucose, Bld: 99 mg/dL (ref 65–99)
POTASSIUM: 4.6 mmol/L (ref 3.5–5.1)
SODIUM: 137 mmol/L (ref 135–145)
Total Bilirubin: 1.1 mg/dL (ref 0.3–1.2)
Total Protein: 5.8 g/dL — ABNORMAL LOW (ref 6.5–8.1)

## 2016-08-07 LAB — CBC WITH DIFFERENTIAL/PLATELET
Basophils Absolute: 0 10*3/uL (ref 0.0–0.1)
Basophils Relative: 0 %
EOS PCT: 1 %
Eosinophils Absolute: 0 10*3/uL (ref 0.0–0.7)
HCT: 35.4 % — ABNORMAL LOW (ref 36.0–46.0)
HEMOGLOBIN: 10.9 g/dL — AB (ref 12.0–15.0)
LYMPHS ABS: 1 10*3/uL (ref 0.7–4.0)
LYMPHS PCT: 15 %
MCH: 29.1 pg (ref 26.0–34.0)
MCHC: 30.8 g/dL (ref 30.0–36.0)
MCV: 94.7 fL (ref 78.0–100.0)
Monocytes Absolute: 0.5 10*3/uL (ref 0.1–1.0)
Monocytes Relative: 8 %
NEUTROS PCT: 76 %
Neutro Abs: 5.1 10*3/uL (ref 1.7–7.7)
Platelets: 127 10*3/uL — ABNORMAL LOW (ref 150–400)
RBC: 3.74 MIL/uL — AB (ref 3.87–5.11)
RDW: 16.2 % — ABNORMAL HIGH (ref 11.5–15.5)
WBC: 6.6 10*3/uL (ref 4.0–10.5)

## 2016-08-07 LAB — OCCULT BLOOD X 1 CARD TO LAB, STOOL: Fecal Occult Bld: NEGATIVE

## 2016-08-07 MED ORDER — WITCH HAZEL-GLYCERIN EX PADS
MEDICATED_PAD | CUTANEOUS | Status: DC | PRN
  Administered 2016-08-07: 1 via TOPICAL
  Filled 2016-08-07 (×2): qty 100

## 2016-08-07 MED ORDER — MYCOPHENOLATE SODIUM 180 MG PO TBEC
360.0000 mg | DELAYED_RELEASE_TABLET | Freq: Two times a day (BID) | ORAL | Status: DC
  Administered 2016-08-07 – 2016-08-08 (×3): 360 mg via ORAL
  Filled 2016-08-07 (×3): qty 2

## 2016-08-07 MED ORDER — METOPROLOL TARTRATE 5 MG/5ML IV SOLN
5.0000 mg | Freq: Three times a day (TID) | INTRAVENOUS | Status: DC
  Administered 2016-08-07 – 2016-08-08 (×3): 5 mg via INTRAVENOUS
  Filled 2016-08-07 (×3): qty 5

## 2016-08-07 NOTE — Progress Notes (Signed)
Patient ID: [name], female   DOB: 04-10-1941, 76 y.o.   MRN: [id number]  PROGRESS NOTE    [name]  FKC:[id number] DOB: May 08, 1941 DOA: 08/06/2016  PCP: Ann Held, DO   Brief Narrative:  76 y.o. female with medical history significant for Crohns dz, SLE, HTN, DVT, PAF, renal tx with CKD stage 3 , OSA on CPAP, hypothyroidism who presented to the ER with abrupt onset of right-sided abdominal pain associated with nausea and vomiting and 1 blood-streaked stool. CT revealed ileus versus early small bowel obstruction.  FOB was negative for blood. She was started on IV solumedrol.    Assessment & Plan:   Principal Problem:   SBO (small bowel obstruction) / Crohn's disease ([name]) - Repeat imaging showed no SBO - She will try clear liquids and then soft diet if she feels better - Continue IV steroids and switch to PO either today if she goes home or tomorrow  Active Problems:   Atrial fibrillation, paroxysmal (HCC) - CHADS vasc score 5 - On coumadin for AC - Rate controlled with metoprolol    CKD (chronic kidney disease) stage 3, GFR 30-59 ml/min - Cr stable     History of renal transplant - Changed prednisone to solumedrol - Resume mycophenolate     HTN (hypertension), essential - Continue metoprolol    Hypothyroidism - Continue synthroid     History of DVT (deep vein thrombosis) - On coumadin     OSA (obstructive sleep apnea)  - CPAP at bedtime   DVT prophylaxis: on Coumadin Code Status: full code  Family Communication: no family at the bedside this am Disposition Plan: home later on [place] or tomorrow depending on how she tolerates the diet    Consultants:   None   Procedures:   None   Antimicrobials:   None     Subjective: No overnight events. Abd pain better.  Objective: Vitals:   08/06/16 0938 08/06/16 1723 08/07/16 0612 08/07/16 0801  BP: (!) 148/72 140/73 (!) 142/70 (!) 181/72  Pulse: 74 67 61 (!) 59  Resp: 18 18 18 18     Temp: 98 F (36.7 C) 98 F (36.7 C) 98 F (36.7 C) 97.7 F (36.5 C)  TempSrc: Oral Oral Oral Oral  SpO2: 99% 98% 99% 100%  Weight:  62.4 kg (137 lb 8 oz)    Height:        Intake/Output Summary (Last 24 hours) at 08/07/16 1343 Last data filed at 08/07/16 1000  Gross per 24 hour  Intake          2129.16 ml  Output              275 ml  Net          1854.16 ml   Filed Weights   08/06/16 0412 08/06/16 1723  Weight: 61.2 kg (135 lb) 62.4 kg (137 lb 8 oz)    Examination:  General exam: Appears calm and comfortable  Respiratory system: Clear to auscultation. Respiratory effort normal. Cardiovascular system: S1 & S2 heard, Rate controlled  Gastrointestinal system: Abdomen is nondistended, soft and nontender. No organomegaly or masses felt. Normal bowel sounds heard. Central nervous system: Alert and oriented. No focal neurological deficits. Extremities: Symmetric 5 x 5 power. Skin: No rashes, lesions or ulcers Psychiatry: Judgement and insight appear normal. Mood & affect appropriate.   Data Reviewed: I have personally reviewed following labs and imaging studies  CBC:  Recent Labs Lab 08/06/16 0424 08/07/16 0457  WBC 12.4* 6.6  NEUTROABS 10.3* 5.1  HGB 12.9 10.9*  HCT 41.7 35.4*  MCV 95.2 94.7  PLT 156 956*   Basic Metabolic Panel:  Recent Labs Lab 08/06/16 0424 08/07/16 0457  NA 136 137  K 5.3* 4.6  CL 105 108  CO2 22 20*  GLUCOSE 154* 99  BUN 47* 40*  CREATININE 1.70* 1.69*  CALCIUM 9.6 8.7*   GFR: Estimated Creatinine Clearance: 24.5 mL/min (A) (by C-G formula based on SCr of 1.69 mg/dL (H)). Liver Function Tests:  Recent Labs Lab 08/06/16 0424 08/07/16 0457  AST 19 17  ALT 12* 11*  ALKPHOS 169* 126  BILITOT 0.6 1.1  PROT 7.4 5.8*  ALBUMIN 4.2 3.3*    Recent Labs Lab 08/06/16 0424  LIPASE 31   No results for input(s): AMMONIA in the last 168 hours. Coagulation Profile:  Recent Labs Lab 08/06/16 0424  INR 1.24   Cardiac  Enzymes: No results for input(s): CKTOTAL, CKMB, CKMBINDEX, TROPONINI in the last 168 hours. BNP (last 3 results) No results for input(s): PROBNP in the last 8760 hours. HbA1C: No results for input(s): HGBA1C in the last 72 hours. CBG: No results for input(s): GLUCAP in the last 168 hours. Lipid Profile: No results for input(s): CHOL, HDL, LDLCALC, TRIG, CHOLHDL, LDLDIRECT in the last 72 hours. Thyroid Function Tests: No results for input(s): TSH, T4TOTAL, FREET4, T3FREE, THYROIDAB in the last 72 hours. Anemia Panel: No results for input(s): VITAMINB12, FOLATE, FERRITIN, TIBC, IRON, RETICCTPCT in the last 72 hours. Urine analysis:    Component Value Date/Time   COLORURINE YELLOW 08/06/2016 0424   APPEARANCEUR CLEAR 08/06/2016 0424   LABSPEC 1.012 08/06/2016 0424   PHURINE 6.5 08/06/2016 0424   GLUCOSEU NEGATIVE 08/06/2016 0424   HGBUR NEGATIVE 08/06/2016 0424   BILIRUBINUR NEGATIVE 08/06/2016 0424   BILIRUBINUR neg 09/02/2015 1124   KETONESUR NEGATIVE 08/06/2016 0424   PROTEINUR NEGATIVE 08/06/2016 0424   UROBILINOGEN 0.2 09/02/2015 1124   UROBILINOGEN 0.2 08/23/2014 0156   NITRITE NEGATIVE 08/06/2016 0424   LEUKOCYTESUR TRACE (A) 08/06/2016 0424   Sepsis Labs: @LABRCNTIP (procalcitonin:4,lacticidven:4)   )No results found for this or any previous visit (from the past 240 hour(s)).    Radiology Studies: Ct Abdomen Pelvis Wo Contrast Result Date: 08/06/2016 1. Dilated fluid-filled small bowel without transition point. This may reflect early small bowel obstruction versus ileus. 2. 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 Post bilateral nephrectomies of native kidneys. 3. Aortic and branch atherosclerosis.  Dg Abd 2 Views Result Date: 08/07/2016 1. No acute abnormality. The previously demonstrated fluid-filled dilated small bowel loop could still be dilated and not visualized due to a lack of gas in that loop. 2. Aortic atherosclerosis.      Scheduled Meds: . levothyroxine  50 mcg Intravenous Daily  . methylPREDNISolone (SOLU-MEDROL) injection  40 mg Intravenous Daily  . metoprolol  5 mg Intravenous Q8H   Continuous Infusions: . sodium chloride 125 mL/hr at 08/07/16 0649     LOS: 0 days    Time spent: 25 minutes Greater than 50% of the time spent on counseling and coordinating the care.   Leisa Lenz, MD [facility] Pager [phone]  If 7PM-7AM, please contact night-coverage [email or web address] Password [email or web address] 08/07/2016, 1:43 PM

## 2016-08-07 NOTE — Progress Notes (Signed)
Pt has home CPAP machine at bedside and will place herself on.

## 2016-08-08 DIAGNOSIS — Z940 Kidney transplant status: Secondary | ICD-10-CM

## 2016-08-08 DIAGNOSIS — N183 Chronic kidney disease, stage 3 (moderate): Secondary | ICD-10-CM

## 2016-08-08 DIAGNOSIS — I4891 Unspecified atrial fibrillation: Secondary | ICD-10-CM

## 2016-08-08 LAB — BASIC METABOLIC PANEL
Anion gap: 7 (ref 5–15)
BUN: 31 mg/dL — AB (ref 6–20)
CALCIUM: 7.7 mg/dL — AB (ref 8.9–10.3)
CO2: 17 mmol/L — ABNORMAL LOW (ref 22–32)
CREATININE: 1.27 mg/dL — AB (ref 0.44–1.00)
Chloride: 115 mmol/L — ABNORMAL HIGH (ref 101–111)
GFR calc Af Amer: 47 mL/min — ABNORMAL LOW (ref >60)
GFR calc non Af Amer: 41 mL/min — ABNORMAL LOW (ref >60)
Glucose, Bld: 79 mg/dL (ref 65–99)
Potassium: 3.5 mmol/L (ref 3.5–5.1)
SODIUM: 139 mmol/L (ref 135–145)

## 2016-08-08 LAB — CBC
HCT: 30.6 % — ABNORMAL LOW (ref 36.0–46.0)
HCT: 32.3 % — ABNORMAL LOW (ref 36.0–46.0)
Hemoglobin: 9.3 g/dL — ABNORMAL LOW (ref 12.0–15.0)
Hemoglobin: 9.9 g/dL — ABNORMAL LOW (ref 12.0–15.0)
MCH: 28.8 pg (ref 26.0–34.0)
MCH: 28.9 pg (ref 26.0–34.0)
MCHC: 30.4 g/dL (ref 30.0–36.0)
MCHC: 30.7 g/dL (ref 30.0–36.0)
MCV: 94.7 fL (ref 78.0–100.0)
MCV: 94.4 fL (ref 78.0–100.0)
Platelets: 113 10*3/uL — ABNORMAL LOW (ref 150–400)
RBC: 3.23 MIL/uL — ABNORMAL LOW (ref 3.87–5.11)
RBC: 3.42 MIL/uL — ABNORMAL LOW (ref 3.87–5.11)
RDW: 16.4 % — ABNORMAL HIGH (ref 11.5–15.5)
RDW: 16.2 % — AB (ref 11.5–15.5)
WBC: 6.8 10*3/uL (ref 4.0–10.5)
WBC: 6.9 10*3/uL (ref 4.0–10.5)

## 2016-08-08 MED ORDER — TACROLIMUS 1 MG PO CAPS: 1.0000 mg | ORAL_CAPSULE | Freq: Two times a day (BID) | ORAL | Status: DC

## 2016-08-08 MED ORDER — TACROLIMUS 1 MG PO CAPS
4.0000 mg | ORAL_CAPSULE | Freq: Two times a day (BID) | ORAL | Status: DC
  Administered 2016-08-08: 4 mg via ORAL
  Filled 2016-08-08: qty 4

## 2016-08-08 NOTE — Progress Notes (Signed)
Pt discharge to home, discharge instructions, follow up appointments and medications discussed and reviewed with pt, verbalized understanding. IV discontinued, catheter intact, site clean and dry. Pt was escorted out of the unit in wheelchair accompanied by her husband, took all belonging with them.

## 2016-08-08 NOTE — Discharge Instructions (Signed)
Crohn Disease Crohn disease is a long-lasting (chronic) disease that affects your gastrointestinal (GI) tract. It often causes irritation and swelling (inflammation) in your small intestine and the beginning of your large intestine. However, it can affect any part of your GI tract. Crohn disease is part of a group of illnesses that are known as inflammatory bowel disease (IBD). Crohn disease may start slowly and get worse over time. Symptoms may come and go. They may also disappear for months or even years at a time (remission). What are the causes? The exact cause of Crohn disease is not known. It may be a response that causes your body's defense system (immune system) to mistakenly attack healthy cells and tissues (autoimmune response). Your genes and your environment may also play a role. What increases the risk? You may be at greater risk for Crohn disease if you:  Have other family members with Crohn disease or another IBD.  Use any tobacco products, including cigarettes, chewing tobacco, or electronic cigarettes.  Are in your 15s.  Have [place] European ancestry. What are the signs or symptoms? The main signs and symptoms of Crohn disease involve your GI tract. These include:  Diarrhea.  Rectal bleeding.  An urgent need to move your bowels.  The feeling that you are not finished having a bowel movement.  Abdominal pain or cramping.  Constipation. General signs and symptoms of Crohn disease may also include:  Unexplained weight loss.  Fatigue.  Fever.  Nausea.  Loss of appetite.  Joint pain  Changes in vision.  Red bumps on your skin. How is this diagnosed? Your health care provider may suspect Crohn disease based on your symptoms and your medical history. Your health care provider will do a physical exam. You may need to see a health care provider who specializes in diseases of the digestive tract (gastroenterologist). You may also have tests to help your health  care providers make a diagnosis. These may include:  Blood tests.  Stool sample tests.  Imaging tests, such as X-rays and CT scans.  Tests to examine the inside of your intestines using a long, flexible tube that has a light and a camera on the end (endoscopy or colonoscopy).  A procedure to take tissue samples from inside your bowel (biopsy) to be examined under a microscope. How is this treated? There is no cure for Crohn disease. Treatment will focus on managing your symptoms. Crohn disease affects each person differently. Your treatment may include:  Resting your bowels. Drinking only clear liquids or getting nutrition through an IV for a period of time gives your bowels a chance to heal because they are not passing stools.  Medicines. These may be used alone or in combination (combination therapy). These may include antibiotic medicines. You may be given medicines that help to:  Reduce inflammation.  Control your immune system activity.  Fight infections.  Relieve cramps and prevent diarrhea.  Control your pain.  Surgery. You may need surgery if:  Medicines and other treatments are no longer working.  You develop complications from severe Crohn disease.  A section of your intestine becomes so damaged that it needs to be removed. Follow these instructions at home:  Take medicines only as directed by your health care provider.  If you were prescribed an antibiotic medicine, finish it all even if you start to feel better.  Keep all follow-up visits as directed by your health care provider. This is important.  Talk with your health care provider about changing  your diet. This may help your symptoms. Your health care provide may recommend changes, such as:  Drinking more fluids.  Avoiding milk and other foods that contain lactose.  Eating a low-fat diet.  Avoiding high-fiber foods, such as popcorn and nuts.  Avoiding carbonated beverages, such as soda.  Eating  smaller meals more often rather than eating large meals.  Keeping a food diary to identify foods that make your symptoms better or worse.  Do not use any tobacco products, including cigarettes, chewing tobacco, or electronic cigarettes. If you need help quitting, ask your health care provider.  Limit alcohol intake to no more than 1 drink per day for nonpregnant women and 2 drinks per day for men. One drink equals 12 ounces of beer, 5 ounces of wine, or 1 ounces of hard liquor.  Exercise daily or as directed by your health care provider. Contact a health care provider if:  You have diarrhea, abdominal cramps, and other gastrointestinal problems that are present almost all of the time.  Your symptoms do not improve with treatment.  You continue to lose weight.  You develop a rash or sores on your skin.  You develop eye problems.  You have a fever.  Your symptoms get worse.  You develop new symptoms. Get help right away if:  You have bloody diarrhea.  You develop severe abdominal pain.  You cannot pass stools. This information is not intended to replace advice given to you by your health care provider. Make sure you discuss any questions you have with your health care provider. Document Released: 02/02/2005 Document Revised: 09/03/2015 Document Reviewed: 12/11/2013 Elsevier Interactive Patient Education  2017 [employer].

## 2016-08-08 NOTE — Discharge Summary (Signed)
Physician Discharge Summary  [name] MAU:[id number] DOB: 1940-06-20 DOA: 08/06/2016  PCP: Ann Held, DO  Admit date: 08/06/2016 Discharge date: 08/08/2016  Recommendations for Outpatient Follow-up:  Continue home meds as per prior to the admission  Discharge Diagnoses:  Principal Problem:   SBO (small bowel obstruction) Active Problems:   Atrial fibrillation (HCC)   CKD (chronic kidney disease) stage 3, GFR 30-59 ml/min   History of renal transplant   HTN (hypertension)   Hypothyroidism   Crohn's disease (HCC)   History of DVT (deep vein thrombosis)   SLE (systemic lupus erythematosus) (HCC)   OSA (obstructive sleep apnea)    Discharge Condition: stable   Diet recommendation: as tolerated   History of present illness:  76 y.o.femalewith medical history significant for Crohns dz, SLE, HTN, DVT, PAF, renal txwith CKD stage 3 , OSA on CPAP,hypothyroidism who presented to the ER with abrupt onset of right-sided abdominal pain associated with nausea and vomiting and 1 blood-streaked stool. CT revealed ileus versus early small bowel obstruction.  FOBwas negative for blood. She was started on IV solumedrol.   Hospital Course:  Principal Problem:   SBO (small bowel obstruction) / Crohn's disease ([place]) - Repeat imaging showed no SBO - Tolerates reg diet - No SBO on repeat imaging - May resume home dose prednisone   Active Problems:   Atrial fibrillation, paroxysmal (HCC) - CHADS vasc score 5 - On coumadin for AC - Rate controlled with metoprolol    CKD (chronic kidney disease) stage 3, GFR 30-59 ml/min - Cr stable and improved since admission     History of renal transplant - Changed prednisone to solumedrol - Resumed pt meds    HTN (hypertension), essential - Continue metoprolol    Hypothyroidism - Continue synthroid     History of DVT (deep vein thrombosis) - On coumadin     OSA (obstructive sleep apnea)  - CPAP at bedtime   DVT  prophylaxis: on Coumadin Code Status: full code  Family Communication: no family at the bedside this am; spoke with pt husband over the phone     Consultants:   None   Procedures:   None   Antimicrobials:   None      Signed:  Leisa Lenz, MD  [facility] 08/08/2016, 11:12 AM  Pager #: [phone]  Time spent in minutes: less than 30 minutes  Discharge Exam: Vitals:   08/08/16 0403 08/08/16 1044  BP: (!) 182/69 (!) 184/74  Pulse: 60 68  Resp: 18 18  Temp: 97.5 F (36.4 C) 97.5 F (36.4 C)   Vitals:   08/07/16 0801 08/07/16 2101 08/08/16 0403 08/08/16 1044  BP: (!) 181/72 (!) 137/59 (!) 182/69 (!) 184/74  Pulse: (!) 59 67 60 68  Resp: 18 17 18 18   Temp: 97.7 F (36.5 C) 97.5 F (36.4 C) 97.5 F (36.4 C) 97.5 F (36.4 C)  TempSrc: Oral Oral Oral Oral  SpO2: 100% 97% 99% 94%  Weight:  62.7 kg (138 lb 3.2 oz)    Height:  5' 3"  (1.6 m)      General: Pt is alert, follows commands appropriately, not in acute distress Cardiovascular: Regular rate and rhythm, S1/S2 + Respiratory: Clear to auscultation bilaterally, no wheezing, no crackles, no rhonchi Abdominal: Soft, non tender, non distended, bowel sounds +, no guarding Extremities: no cyanosis, pulses palpable bilaterally DP and PT Neuro: Grossly nonfocal  Discharge Instructions  Discharge Instructions    Call MD for:  persistant nausea and  vomiting    Complete by:  As directed    Call MD for:  redness, tenderness, or signs of infection (pain, swelling, redness, odor or green/yellow discharge around incision site)    Complete by:  As directed    Call MD for:  severe uncontrolled pain    Complete by:  As directed    Diet - low sodium heart healthy    Complete by:  As directed    Discharge instructions    Complete by:  As directed    Continue home meds as per prior to the admission   Increase activity slowly    Complete by:  As directed      Allergies as of 08/08/2016      Reactions    Amoxicillin Anaphylaxis   Ampicillin Anaphylaxis   Ciprofloxacin Swelling, Other (See Comments)   "of my throat"   Erythromycin Anaphylaxis   Penicillins Anaphylaxis   Sulfa Antibiotics Itching   Sulfamethoxazole-trimethoprim Itching   Tetracycline Anaphylaxis   Labetalol Nausea And Vomiting      Medication List    STOP taking these medications   HYDROcodone-acetaminophen 5-325 MG tablet Commonly known as:  NORCO     TAKE these medications   acetaminophen 325 MG tablet Commonly known as:  TYLENOL Take 650 mg by mouth every 6 (six) hours as needed for mild pain.   allopurinol 100 MG tablet Commonly known as:  ZYLOPRIM Take 1 tablet (100 mg total) by mouth 2 (two) times daily.   calcitRIOL 0.25 MCG capsule Commonly known as:  ROCALTROL Take 0.25 mcg by mouth daily.   epoetin alfa 40000 UNIT/ML injection Commonly known as:  EPOGEN,PROCRIT Inject 40,000 Units into the skin every 21 ( twenty-one) days.   feeding supplement (ENSURE ENLIVE) Liqd Take 237 mLs by mouth 2 (two) times daily between meals.   fluticasone 50 MCG/ACT nasal spray Commonly known as:  FLONASE Place 2 sprays into both nostrils daily. What changed:  when to take this  reasons to take this   gabapentin 100 MG capsule Commonly known as:  NEURONTIN Take 2 capsules (200 mg total) by mouth at bedtime.   levothyroxine 100 MCG tablet Commonly known as:  SYNTHROID, LEVOTHROID Take 1 tablet (100 mcg total) by mouth daily.   MAGNESIUM-OXIDE 400 (241.3 Mg) MG tablet Generic drug:  magnesium oxide Take 400 mg by mouth 2 (two) times daily.   metoprolol tartrate 25 MG tablet Commonly known as:  LOPRESSOR Take 1 tablet (25 mg total) by mouth 2 (two) times daily.   mycophenolate 180 MG EC tablet Commonly known as:  MYFORTIC Take 360 mg by mouth 2 (two) times daily.   NON FORMULARY 1 each by Other route See admin instructions. CPAP nightly   NONFORMULARY OR COMPOUNDED ITEM Lipid , TSH----  DX  hypothyroid, chronic kidney disease, stage 4   NONFORMULARY OR COMPOUNDED ITEM Compression hose-- thigh high  20-30 mm hg  #1 as directed Dx-- varicose veins b/l with pain   omeprazole 20 MG capsule Commonly known as:  PRILOSEC Take 20 mg by mouth daily.   polyethylene glycol packet Commonly known as:  MIRALAX / GLYCOLAX Take 17 g by mouth 2 (two) times daily as needed for moderate constipation.   predniSONE 5 MG tablet Commonly known as:  DELTASONE Take 5 mg by mouth daily.   tacrolimus 1 MG capsule Commonly known as:  PROGRAF Take 4 mg by mouth 2 (two) times daily.   warfarin 2.5 MG tablet Commonly known as:  COUMADIN Take 2.5-5 mg by mouth See admin instructions. Take 77m by mouth on Tuesday and Thursday. Take 2.5 mg by mouth on all other days      Follow-up IVictor DO. Schedule an appointment as soon as possible for a visit in 1 week(s).   Specialty:  Family Medicine Contact information: [address] [place] [phone]            The results of significant diagnostics from this hospitalization (including imaging, microbiology, ancillary and laboratory) are listed below for reference.    Significant Diagnostic Studies: Ct Abdomen Pelvis Wo Contrast  Result Date: 08/06/2016 CLINICAL DATA:  Right lower quadrant pain. History of kidney transplant, Crohn's disease and small-bowel obstruction. EXAM: CT ABDOMEN AND PELVIS WITHOUT CONTRAST TECHNIQUE: Multidetector CT imaging of the abdomen and pelvis was performed following the standard protocol without IV contrast. COMPARISON:  CT 03/04/2014 FINDINGS: Lower chest: Coronary artery calcifications.  No pleural fluid. Hepatobiliary: Stable linear capsular versus diaphragmatic calcifications. Subcentimeter subcapsular hypodensity in the right lobe of the liver, unchanged from prior exam. Postcholecystectomy. Unchanged biliary dilatation measuring 10 mm at the porta  hepatis. No calcified choledocholithiasis. Pancreas: Parenchymal atrophy. No ductal dilatation or inflammation. Spleen: Normal in size. Scattered granuloma. Linear subcapsular calcifications are stable. Adrenals/Urinary Tract: Bilateral nephrectomies. No abnormal soft tissue density in the nephrectomy bed. Normal adrenal glands. Right lower quadrant transplant kidney without transplant hydronephrosis. No transplant perinephric edema. Urinary bladder is physiologically distended. Stomach/Bowel: Small hiatal hernia. Stomach physiologically distended. Progressive small bowel dilatation with dilated pelvic bowel loops measuring 4.9 cm. No definite transition point. More distal small bowel loops are less dilated but remain fluid filled. Faint mesenteric edema about fluid-filled small bowel in the right abdomen. Mild colonic diverticulosis without acute inflammation. The appendix is not visualized. Vascular/Lymphatic: IVC filter in place. Advanced atherosclerosis of the abdominal aorta and its branches. No aneurysm. Reproductive: Atrophic uterus, normal for age.  No adnexal mass. Other: No ascites or free air. Postsurgical change of the anterior abdominal wall small fat containing upper abdominal ventral abdominal wall hernia. Severely atrophic right lateral abdominal wall musculature. Subcutaneous granulomas. Musculoskeletal: Abnormal appearance of T10-T11 with endplate irregularity, probable erosions and surrounding scleroses, stable from chest CT 12/31/2015. Stable trabecular thickening of posterior elements of L1. Chronic findings about both hips with periarticular calcifications/intra-articular bodies. There are no acute or suspicious osseous abnormalities. IMPRESSION: 1. Dilated fluid-filled small bowel without transition point. This may reflect early small bowel obstruction versus ileus. 2. 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 Post bilateral nephrectomies of native  kidneys. 3. Aortic and branch atherosclerosis. Electronically Signed   By: MJeb LeveringM.D.   On: 08/06/2016 07:02   Dg Abd 2 Views  Result Date: 08/07/2016 CLINICAL DATA:  Diarrhea for the past 3 days. Nausea and vomiting for the past 2 days. Severe right abdominal pain. Bilateral kidney transplant 2006 with a subsequent right abdominal hernia. History of Crohn's disease and previous cholecystectomy. EXAM: ABDOMEN - 2 VIEW COMPARISON:  Abdomen and pelvis CT obtained yesterday. FINDINGS: Normal bowel gas pattern without free peritoneal air. Inferior vena cava filter tip at the upper L3 level. Bilateral lower abdominal and upper pelvic surgical clips. Atheromatous arterial calcifications, including the abdominal aorta. Lower pelvic and left groin surgical clips. Partially exophytic sclerotic lesion in the right sacrum and right iliac bone island. These are stable comparing previous CT examinations back to 04/23/2012. The long-term  stability is compatible with a benign process. IMPRESSION: 1. No acute abnormality. The previously demonstrated fluid-filled dilated small bowel loop could still be dilated and not visualized due to a lack of gas in that loop. 2. Aortic atherosclerosis. Electronically Signed   By: Claudie Revering M.D.   On: 08/07/2016 08:01    Microbiology: No results found for this or any previous visit (from the past 240 hour(s)).   Labs: Basic Metabolic Panel:  Recent Labs Lab 08/06/16 0424 08/07/16 0457 08/08/16 0959  NA 136 137 139  K 5.3* 4.6 3.5  CL 105 108 115*  CO2 22 20* 17*  GLUCOSE 154* 99 79  BUN 47* 40* 31*  CREATININE 1.70* 1.69* 1.27*  CALCIUM 9.6 8.7* 7.7*   Liver Function Tests:  Recent Labs Lab 08/06/16 0424 08/07/16 0457  AST 19 17  ALT 12* 11*  ALKPHOS 169* 126  BILITOT 0.6 1.1  PROT 7.4 5.8*  ALBUMIN 4.2 3.3*    Recent Labs Lab 08/06/16 0424  LIPASE 31   No results for input(s): AMMONIA in the last 168 hours. CBC:  Recent Labs Lab  08/06/16 0424 08/07/16 0457 08/08/16 0708 08/08/16 0959  WBC 12.4* 6.6 6.8 6.9  NEUTROABS 10.3* 5.1  --   --   HGB 12.9 10.9* 9.3* 9.9*  HCT 41.7 35.4* 30.6* 32.3*  MCV 95.2 94.7 94.7 94.4  PLT 156 127* QUESTIONABLE RESULTS, RECOMMEND RECOLLECT TO VERIFY PENDING   Cardiac Enzymes: No results for input(s): CKTOTAL, CKMB, CKMBINDEX, TROPONINI in the last 168 hours. BNP: BNP (last 3 results) No results for input(s): BNP in the last 8760 hours.  ProBNP (last 3 results) No results for input(s): PROBNP in the last 8760 hours.  CBG: No results for input(s): GLUCAP in the last 168 hours.

## 2016-08-09 ENCOUNTER — Telehealth: Payer: Self-pay

## 2016-08-09 NOTE — Telephone Encounter (Signed)
08/09/16  Transition Care Management Follow-up Telephone Call  ADMISSION DATE: 08/06/16   DISCHARGE DATE: 08/08/16    How have you been since you were released from the hospital? Patient states she has had no pain,states she is keeping her stomach empty by eating a little at a time..     Do you understand why you were in the hospital? YES   Do you understand the discharge instrcutions? Yes  Items Reviewed:  Medications reviewed:  Reviewed.  Allergies reviewed:Reviewed  Dietary changes reviewed:Low Sodium Heart Healthy  Referrals reviewed: Dr.  Roma Schanz   Functional Questionnaire:   Activities of Daily Living (ADLs):  No help needed at this time   Any transportation issues/concerns?: No,husband can transport if needed.   Any patient concerns? Did not get Prevnar 13 at wellness visit.Marland Kitchen   Confirmed importance and date/time of follow-up visits scheduled: Yes   Confirmed with patient if condition begins to worsen call PCP or go to the ER. Yes   Patient was given the [place] line [phone]: Yes

## 2016-08-11 ENCOUNTER — Ambulatory Visit: Payer: Self-pay | Admitting: *Deleted

## 2016-08-11 ENCOUNTER — Encounter (HOSPITAL_COMMUNITY): Payer: Medicare Other

## 2016-08-11 ENCOUNTER — Ambulatory Visit: Payer: Medicare Other

## 2016-08-15 ENCOUNTER — Other Ambulatory Visit: Payer: Self-pay | Admitting: *Deleted

## 2016-08-15 ENCOUNTER — Ambulatory Visit: Payer: Self-pay | Admitting: *Deleted

## 2016-08-15 ENCOUNTER — Inpatient Hospital Stay: Payer: Medicare Other | Admitting: Family Medicine

## 2016-08-15 NOTE — Patient Outreach (Signed)
[facility]) Care Management  08/15/2016  ANASTAZJA ISAAC 03-02-1941 [id number]   CSW will perform a case closure on patient, due to inability to establish initial phone contact with patient, despite required number of phone attempts made and outreach letter mailed to patient's home.  CSW will notify patient's Telephonic RNCM with [facility], Sherrin Daisy of CSW's plans to close patient's case.  CSW will fax an update to patient's Primary Care Physician, Dr. Garnet Koyanagi to ensure that they are aware of CSW's involvement with patient's plan of care.  CSW will submit a case closure request to Verlon Setting, Care Management Assistant with [facility], in the form of an [employer].  CSW will ensure that Mrs. Comer is aware of Mrs. Manning's, RNCM with [facility], continued involvement with patient's care. Nat Christen, BSW, MSW, LCSW  Licensed Education officer, [facility]  Mailing [place] [address] [place], [place] [address] Physical Address-[address], [place], [place] [address] Toll Free Main # [phone] Fax # [phone] Cell # [phone]  Office # [phone] [email or web address]

## 2016-08-16 ENCOUNTER — Ambulatory Visit: Payer: Self-pay | Admitting: *Deleted

## 2016-08-18 ENCOUNTER — Encounter (HOSPITAL_COMMUNITY)
Admission: RE | Admit: 2016-08-18 | Discharge: 2016-08-18 | Disposition: A | Discharge status: Home / Self Care | Payer: Medicare Other | Source: Ambulatory Visit | Attending: Nephrology | Admitting: Nephrology

## 2016-08-18 DIAGNOSIS — N183 Chronic kidney disease, stage 3 unspecified: Secondary | ICD-10-CM

## 2016-08-18 DIAGNOSIS — D631 Anemia in chronic kidney disease: Secondary | ICD-10-CM | POA: Insufficient documentation

## 2016-08-18 LAB — POCT HEMOGLOBIN-HEMACUE: Hemoglobin: 11.8 g/dL — ABNORMAL LOW (ref 12.0–15.0)

## 2016-08-18 MED ORDER — EPOETIN ALFA 10000 UNIT/ML IJ SOLN: 40000.0000 [IU] | INTRAMUSCULAR | Status: DC

## 2016-08-18 MED ORDER — EPOETIN ALFA 40000 UNIT/ML IJ SOLN
INTRAMUSCULAR | Status: AC
  Administered 2016-08-18: 40000 [IU] via SUBCUTANEOUS
  Filled 2016-08-18: qty 1

## 2016-08-23 ENCOUNTER — Encounter (HOSPITAL_COMMUNITY): Payer: Medicare Other

## 2016-08-23 ENCOUNTER — Encounter: Payer: Self-pay | Admitting: Cardiovascular Disease

## 2016-08-23 DIAGNOSIS — H04123 Dry eye syndrome of bilateral lacrimal glands: Secondary | ICD-10-CM | POA: Diagnosis not present

## 2016-08-23 DIAGNOSIS — H52223 Regular astigmatism, bilateral: Secondary | ICD-10-CM | POA: Diagnosis not present

## 2016-08-23 DIAGNOSIS — H524 Presbyopia: Secondary | ICD-10-CM | POA: Diagnosis not present

## 2016-08-26 ENCOUNTER — Other Ambulatory Visit: Payer: Self-pay | Admitting: Gastroenterology

## 2016-08-26 DIAGNOSIS — I480 Paroxysmal atrial fibrillation: Secondary | ICD-10-CM | POA: Diagnosis not present

## 2016-08-26 DIAGNOSIS — Z789 Other specified health status: Secondary | ICD-10-CM | POA: Diagnosis not present

## 2016-08-26 DIAGNOSIS — K624 Stenosis of anus and rectum: Secondary | ICD-10-CM | POA: Diagnosis not present

## 2016-08-26 DIAGNOSIS — K50919 Crohn's disease, unspecified, with unspecified complications: Secondary | ICD-10-CM | POA: Diagnosis not present

## 2016-08-26 DIAGNOSIS — Z940 Kidney transplant status: Secondary | ICD-10-CM | POA: Diagnosis not present

## 2016-08-26 DIAGNOSIS — K861 Other chronic pancreatitis: Secondary | ICD-10-CM | POA: Diagnosis not present

## 2016-08-26 DIAGNOSIS — K625 Hemorrhage of anus and rectum: Secondary | ICD-10-CM | POA: Diagnosis not present

## 2016-08-26 DIAGNOSIS — Z7901 Long term (current) use of anticoagulants: Secondary | ICD-10-CM | POA: Diagnosis not present

## 2016-08-26 DIAGNOSIS — Z85528 Personal history of other malignant neoplasm of kidney: Secondary | ICD-10-CM | POA: Diagnosis not present

## 2016-08-30 DIAGNOSIS — N184 Chronic kidney disease, stage 4 (severe): Secondary | ICD-10-CM | POA: Diagnosis not present

## 2016-08-30 DIAGNOSIS — I4891 Unspecified atrial fibrillation: Secondary | ICD-10-CM | POA: Diagnosis not present

## 2016-08-30 DIAGNOSIS — Z86718 Personal history of other venous thrombosis and embolism: Secondary | ICD-10-CM | POA: Diagnosis not present

## 2016-08-30 DIAGNOSIS — Z940 Kidney transplant status: Secondary | ICD-10-CM | POA: Diagnosis not present

## 2016-08-30 DIAGNOSIS — M109 Gout, unspecified: Secondary | ICD-10-CM | POA: Diagnosis not present

## 2016-08-30 DIAGNOSIS — D631 Anemia in chronic kidney disease: Secondary | ICD-10-CM | POA: Diagnosis not present

## 2016-08-30 DIAGNOSIS — Z7901 Long term (current) use of anticoagulants: Secondary | ICD-10-CM | POA: Diagnosis not present

## 2016-08-30 DIAGNOSIS — N2581 Secondary hyperparathyroidism of renal origin: Secondary | ICD-10-CM | POA: Diagnosis not present

## 2016-08-30 DIAGNOSIS — E039 Hypothyroidism, unspecified: Secondary | ICD-10-CM | POA: Diagnosis not present

## 2016-08-30 DIAGNOSIS — E782 Mixed hyperlipidemia: Secondary | ICD-10-CM | POA: Diagnosis not present

## 2016-08-30 DIAGNOSIS — N390 Urinary tract infection, site not specified: Secondary | ICD-10-CM | POA: Diagnosis not present

## 2016-08-30 DIAGNOSIS — N2589 Other disorders resulting from impaired renal tubular function: Secondary | ICD-10-CM | POA: Diagnosis not present

## 2016-08-30 DIAGNOSIS — I129 Hypertensive chronic kidney disease with stage 1 through stage 4 chronic kidney disease, or unspecified chronic kidney disease: Secondary | ICD-10-CM | POA: Diagnosis not present

## 2016-08-30 DIAGNOSIS — M329 Systemic lupus erythematosus, unspecified: Secondary | ICD-10-CM | POA: Diagnosis not present

## 2016-08-31 ENCOUNTER — Telehealth: Payer: Self-pay

## 2016-08-31 NOTE — Telephone Encounter (Signed)
Will fax to Dr. Oletta Lamas office.

## 2016-08-31 NOTE — Telephone Encounter (Signed)
Ok to hold coumadin with no lovenox bridge

## 2016-08-31 NOTE — Telephone Encounter (Signed)
Request for procedure clearance:  1. What type of surgery is being performed? Colonoscopy (FlexSig)   2. When is this surgery scheduled? 09/15/16   3. Are there any medications that need to be held prior to surgery and how long? Wants to hold coumadin for 2 days   4. Name of physician performing surgery? Dr. Oletta Lamas  5. What is your office phone and fax number? [phone] FAX [phone]

## 2016-09-08 ENCOUNTER — Encounter (HOSPITAL_COMMUNITY)
Admission: RE | Admit: 2016-09-08 | Discharge: 2016-09-08 | Disposition: A | Discharge status: Home / Self Care | Payer: Medicare Other | Source: Ambulatory Visit | Attending: Nephrology | Admitting: Nephrology

## 2016-09-08 DIAGNOSIS — N183 Chronic kidney disease, stage 3 unspecified: Secondary | ICD-10-CM

## 2016-09-08 DIAGNOSIS — D631 Anemia in chronic kidney disease: Secondary | ICD-10-CM | POA: Diagnosis not present

## 2016-09-08 LAB — RENAL FUNCTION PANEL
ALBUMIN: 3.7 g/dL (ref 3.5–5.0)
ANION GAP: 9 (ref 5–15)
BUN: 43 mg/dL — AB (ref 6–20)
CO2: 22 mmol/L (ref 22–32)
Calcium: 9.3 mg/dL (ref 8.9–10.3)
Chloride: 106 mmol/L (ref 101–111)
Creatinine, Ser: 1.9 mg/dL — ABNORMAL HIGH (ref 0.44–1.00)
GFR calc Af Amer: 29 mL/min — ABNORMAL LOW (ref >60)
GFR, EST NON AFRICAN AMERICAN: 25 mL/min — AB (ref >60)
GLUCOSE: 108 mg/dL — AB (ref 65–99)
PHOSPHORUS: 4.2 mg/dL (ref 2.5–4.6)
POTASSIUM: 4.6 mmol/L (ref 3.5–5.1)
Sodium: 137 mmol/L (ref 135–145)

## 2016-09-08 LAB — PROTIME-INR
INR: 1.92
Prothrombin Time: 22.2 seconds — ABNORMAL HIGH (ref 11.4–15.2)

## 2016-09-08 LAB — FERRITIN: FERRITIN: 903 ng/mL — AB (ref 11–307)

## 2016-09-08 LAB — IRON AND TIBC
IRON: 117 ug/dL (ref 28–170)
SATURATION RATIOS: 55 % — AB (ref 10.4–31.8)
TIBC: 213 ug/dL — ABNORMAL LOW (ref 250–450)
UIBC: 96 ug/dL

## 2016-09-08 LAB — POCT HEMOGLOBIN-HEMACUE: HEMOGLOBIN: 11.8 g/dL — AB (ref 12.0–15.0)

## 2016-09-08 MED ORDER — EPOETIN ALFA 10000 UNIT/ML IJ SOLN: 40000.0000 [IU] | INTRAMUSCULAR | Status: DC

## 2016-09-08 MED ORDER — EPOETIN ALFA 40000 UNIT/ML IJ SOLN
INTRAMUSCULAR | Status: AC
  Administered 2016-09-08: 14:00:00 40000 [IU] via SUBCUTANEOUS
  Filled 2016-09-08: qty 1

## 2016-09-09 ENCOUNTER — Encounter: Payer: Self-pay | Admitting: Cardiovascular Disease

## 2016-09-09 ENCOUNTER — Ambulatory Visit (INDEPENDENT_AMBULATORY_CARE_PROVIDER_SITE_OTHER): Payer: Medicare Other | Admitting: Cardiovascular Disease

## 2016-09-09 VITALS — BP 118/70 | HR 80 | Ht 63.0 in | Wt 136.1 lb

## 2016-09-09 DIAGNOSIS — I351 Nonrheumatic aortic (valve) insufficiency: Secondary | ICD-10-CM | POA: Diagnosis not present

## 2016-09-09 NOTE — Patient Instructions (Addendum)

## 2016-09-09 NOTE — Progress Notes (Signed)
Patient ID: [name], female   DOB: 08-Nov-1940, 76 y.o.   MRN: [id number]   76 y.o.  with HTN and PAF  Initially seen 09/2014 Previously seen by [facility] and most recently by [facility] Dr Matthew Saras. Reviewed over 35 pages of records. Longstanding PAF and history of DVT On chronic coumadin. CRF with previous dialysis and transplant 2006 Baseline Cr 2.1 She is on low dose amiodarone 50 bid noted labs 1/14 wit suppressed TSH .24 ( LLN .34) She has been maint NSR with no dyspnea chest pain or palpitations No echo's but patient indicates normal LV function. She uses Dr Dederding as primary.   Echo 09/30/15 EF normal  Cavity size normal moderate AR personally reviewed  LA severely dilated   4.2.18 Hospitalized with SBO From adhesions , hernia and chrones UTI on ceftin now    ROS: Denies fever, malais, weight loss, blurry vision, decreased visual acuity, cough, sputum, SOB, hemoptysis, pleuritic pain, palpitaitons, heartburn, abdominal pain, melena, lower extremity edema, claudication, or rash.  All other systems reviewed and negative  General: Affect appropriate Mildly cushingoid  HEENT: normal Neck supple with no adenopathy JVP normal no bruits no thyromegaly Lungs clear with no wheezing and good diaphragmatic motion Heart:  S1/S2 AR  murmur, no rub, gallop or click old dialysis graft LUE PMI normal Abdomen: benighn, BS positve, no tenderness, no AAA old dialysis graft left groin With lateral hernia on right  no bruit.  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 acetaminophen (TYLENOL) 325 MG tablet Take 650 mg by mouth every 6 (six) hours as needed for mild pain.    Marland Kitchen allopurinol (ZYLOPRIM) 100 MG tablet Take 1 tablet (100 mg total) by mouth 2 (two) times daily. 30 tablet 2  . calcitRIOL (ROCALTROL) 0.25 MCG capsule Take 0.25 mcg by mouth  daily.     Marland Kitchen epoetin alfa (EPOGEN,PROCRIT) 42683 UNIT/ML injection Inject 40,000 Units into the skin every 21 ( twenty-one) days.     . feeding supplement, ENSURE ENLIVE, (ENSURE ENLIVE) LIQD Take 237 mLs by mouth 2 (two) times daily between meals. 60 Bottle 12  . fluticasone (FLONASE) 50 MCG/ACT nasal spray Place 2 sprays into both nostrils daily. (Patient taking differently: Place 2 sprays into both nostrils daily as needed for allergies. ) 16 g 1  . gabapentin (NEURONTIN) 100 MG capsule Take 2 capsules (200 mg total) by mouth at bedtime.    Marland Kitchen levothyroxine (SYNTHROID, LEVOTHROID) 100 MCG tablet Take 1 tablet (100 mcg total) by mouth daily.    Marland Kitchen MAGNESIUM-OXIDE 400 (241.3 MG) MG tablet Take 400 mg by mouth 2 (two) times daily.  0  . metoprolol tartrate (LOPRESSOR) 25 MG tablet Take 1 tablet (25 mg total) by mouth 2 (two) times daily.    . mycophenolate (MYFORTIC) 180 MG EC tablet Take 360 mg by mouth 2 (two) times daily.      . NON FORMULARY 1 each by Other route See admin instructions. CPAP nightly    . NONFORMULARY OR COMPOUNDED ITEM Lipid , TSH----  DX hypothyroid, chronic kidney disease, stage 4 1 each 0  . NONFORMULARY OR COMPOUNDED ITEM Compression hose-- thigh high  20-30 mm hg  #1 as directed Dx-- varicose veins b/l with pain 1 each 0  . omeprazole (PRILOSEC) 20 MG capsule Take 20 mg by mouth daily.    Marland Kitchen  polyethylene glycol (MIRALAX / GLYCOLAX) packet Take 17 g by mouth 2 (two) times daily as needed for moderate constipation.     . predniSONE (DELTASONE) 5 MG tablet Take 5 mg by mouth daily.     . tacrolimus (PROGRAF) 1 MG capsule Take 4 mg by mouth 2 (two) times daily.     Marland Kitchen warfarin (COUMADIN) 2.5 MG tablet Take 2.5-5 mg by mouth See admin instructions. Take 24m by mouth on Tuesday and Thursday. Take 2.5 mg by mouth on all other days     No current facility-administered medications for this visit.     Allergies  Amoxicillin; Ampicillin; Ciprofloxacin; Erythromycin; Penicillins;  Sulfa antibiotics; Sulfamethoxazole-trimethoprim; Tetracycline; and Labetalol  Electrocardiogram:  NSR RBBB   09/04/13   NSR RBBB rate 77 no change from 2014  09/09/16 SR rate 57 LVH inferolateral T wave changes   Assessment and Plan AR:  Moderate no change in murmur f/u echo  In a year  CRF:  F/U Dr Dederding  Baseline Cr mid 2's on prograf and deltasone Gout:  On allopurinol  Had pulse dose prednisone for Rx PAF: maint NSR  Amiodarone stopped due to low DLCO DVT:  History of 12/2011 extending from common femoral to calf veins  RBBB:  Chronic no change no high grade AV block yearly ECG  SBO:  Resolved on conservative Rx   PJenkins Rouge

## 2016-09-15 ENCOUNTER — Ambulatory Visit (HOSPITAL_COMMUNITY): Admission: RE | Admit: 2016-09-15 | Payer: Medicare Other | Source: Ambulatory Visit | Admitting: Gastroenterology

## 2016-09-15 ENCOUNTER — Encounter (HOSPITAL_COMMUNITY): Admission: RE | Payer: Self-pay | Source: Ambulatory Visit

## 2016-09-15 SURGERY — SIGMOIDOSCOPY, FLEXIBLE
Anesthesia: Moderate Sedation

## 2016-09-19 DIAGNOSIS — Z940 Kidney transplant status: Secondary | ICD-10-CM | POA: Diagnosis not present

## 2016-09-19 DIAGNOSIS — N390 Urinary tract infection, site not specified: Secondary | ICD-10-CM | POA: Diagnosis not present

## 2016-09-19 DIAGNOSIS — Z7901 Long term (current) use of anticoagulants: Secondary | ICD-10-CM | POA: Diagnosis not present

## 2016-09-22 ENCOUNTER — Other Ambulatory Visit: Payer: Self-pay | Admitting: *Deleted

## 2016-09-22 NOTE — Patient Outreach (Signed)
[facility]) Care Management  09/22/2016  [name] 02-22-1941 [id number]  Follow up telephone call to patient; left voice message requesting return call.  Plan: Will follow up.  Sherrin Daisy, RN BSN [name]  [phone]

## 2016-09-22 NOTE — Patient Outreach (Signed)
[facility]) Care Management  09/22/2016  [name] [birth year] [id number]  Return call from patient.  Voices that she is feeling well'.  States she is seeing nephrologist every 3 months. States she received renal diet that was sent by previous RN case manager and has reviewed it. States she understands & has been reading labels and has been able to control intake of sodium, potassium, phosphorus, calcium and protein. States she is not currently on fluid restriction. States she has cut down on her portions & gets exercise on regular basis. Voices if she needs dietician her nephrologist will make referral. States educational materials have been very helpful.   States she currently does not have balance problems & has not had any falls.  States balance off previously because she had UTI. States she completed antibiotic therapy and has not had any more balance problems. Reviewed reportable urinary symptoms that require reporting to MD. Pt voices understanding.   Patient voices she was hospitalized 2 days end of March for bowel obstruction that occurred due to scar tissue. States she had bowel rest & condition resolved. States she has had follow up with gastroenterologist and did not have to have any further testing.   Patient states she has no further case management needs & feels comfortable with self management of her chronic health conditions. Agrees with closing of case.  Plan: Send case closure of RN care coordinator services to MD. Send to care management assistant to close case.  Sherrin Daisy, RN BSN CCM Care Management Coordinator [facility] Care Management  [phone]   .

## 2016-09-23 ENCOUNTER — Encounter: Payer: Self-pay | Admitting: *Deleted

## 2016-09-28 ENCOUNTER — Other Ambulatory Visit (HOSPITAL_COMMUNITY): Payer: Self-pay | Admitting: *Deleted

## 2016-09-29 ENCOUNTER — Encounter (HOSPITAL_COMMUNITY)
Admission: RE | Admit: 2016-09-29 | Discharge: 2016-09-29 | Disposition: A | Discharge status: Home / Self Care | Payer: Medicare Other | Source: Ambulatory Visit | Attending: Nephrology | Admitting: Nephrology

## 2016-09-29 DIAGNOSIS — D631 Anemia in chronic kidney disease: Secondary | ICD-10-CM | POA: Diagnosis not present

## 2016-09-29 DIAGNOSIS — N183 Chronic kidney disease, stage 3 unspecified: Secondary | ICD-10-CM

## 2016-09-29 LAB — POCT HEMOGLOBIN-HEMACUE: Hemoglobin: 11.6 g/dL — ABNORMAL LOW (ref 12.0–15.0)

## 2016-09-29 LAB — PROTIME-INR
INR: 1.98
PROTHROMBIN TIME: 22.8 s — AB (ref 11.4–15.2)

## 2016-09-29 MED ORDER — EPOETIN ALFA 10000 UNIT/ML IJ SOLN: 40000.0000 [IU] | INTRAMUSCULAR | Status: DC

## 2016-09-29 MED ORDER — EPOETIN ALFA 40000 UNIT/ML IJ SOLN
INTRAMUSCULAR | Status: AC
  Administered 2016-09-29: 14:00:00 40000 [IU] via SUBCUTANEOUS
  Filled 2016-09-29: qty 1

## 2016-10-27 ENCOUNTER — Encounter (HOSPITAL_COMMUNITY)
Admission: RE | Admit: 2016-10-27 | Discharge: 2016-10-27 | Disposition: A | Discharge status: Home / Self Care | Payer: Medicare Other | Source: Ambulatory Visit | Attending: Nephrology | Admitting: Nephrology

## 2016-10-27 DIAGNOSIS — N183 Chronic kidney disease, stage 3 unspecified: Secondary | ICD-10-CM

## 2016-10-27 DIAGNOSIS — D631 Anemia in chronic kidney disease: Secondary | ICD-10-CM | POA: Diagnosis not present

## 2016-10-27 LAB — RENAL FUNCTION PANEL
Albumin: 3.8 g/dL (ref 3.5–5.0)
Anion gap: 8 (ref 5–15)
BUN: 46 mg/dL — AB (ref 6–20)
CALCIUM: 9.2 mg/dL (ref 8.9–10.3)
CHLORIDE: 106 mmol/L (ref 101–111)
CO2: 21 mmol/L — AB (ref 22–32)
CREATININE: 1.68 mg/dL — AB (ref 0.44–1.00)
GFR calc non Af Amer: 29 mL/min — ABNORMAL LOW (ref >60)
GFR, EST AFRICAN AMERICAN: 34 mL/min — AB (ref >60)
GLUCOSE: 93 mg/dL (ref 65–99)
Phosphorus: 3.6 mg/dL (ref 2.5–4.6)
Potassium: 4.4 mmol/L (ref 3.5–5.1)
SODIUM: 135 mmol/L (ref 135–145)

## 2016-10-27 LAB — IRON AND TIBC
IRON: 102 ug/dL (ref 28–170)
SATURATION RATIOS: 49 % — AB (ref 10.4–31.8)
TIBC: 207 ug/dL — AB (ref 250–450)
UIBC: 105 ug/dL

## 2016-10-27 LAB — FERRITIN: FERRITIN: 980 ng/mL — AB (ref 11–307)

## 2016-10-27 LAB — POCT HEMOGLOBIN-HEMACUE: HEMOGLOBIN: 11.5 g/dL — AB (ref 12.0–15.0)

## 2016-10-27 MED ORDER — EPOETIN ALFA 10000 UNIT/ML IJ SOLN: 40000.0000 [IU] | INTRAMUSCULAR | Status: DC

## 2016-10-27 MED ORDER — EPOETIN ALFA 40000 UNIT/ML IJ SOLN
INTRAMUSCULAR | Status: AC
  Administered 2016-10-27: 14:00:00 40000 [IU] via SUBCUTANEOUS
  Filled 2016-10-27: qty 1

## 2016-11-06 DIAGNOSIS — K56609 Unspecified intestinal obstruction, unspecified as to partial versus complete obstruction: Secondary | ICD-10-CM

## 2016-11-06 HISTORY — DX: Unspecified intestinal obstruction, unspecified as to partial versus complete obstruction: K56.609

## 2016-11-17 ENCOUNTER — Encounter (HOSPITAL_COMMUNITY)
Admission: RE | Admit: 2016-11-17 | Discharge: 2016-11-17 | Disposition: A | Discharge status: Home / Self Care | Payer: Medicare Other | Source: Ambulatory Visit | Attending: Nephrology | Admitting: Nephrology

## 2016-11-17 ENCOUNTER — Encounter (HOSPITAL_COMMUNITY): Payer: Self-pay

## 2016-11-17 DIAGNOSIS — D631 Anemia in chronic kidney disease: Secondary | ICD-10-CM | POA: Insufficient documentation

## 2016-11-17 DIAGNOSIS — N183 Chronic kidney disease, stage 3 unspecified: Secondary | ICD-10-CM

## 2016-11-17 LAB — POCT HEMOGLOBIN-HEMACUE: Hemoglobin: 11.1 g/dL — ABNORMAL LOW (ref 12.0–15.0)

## 2016-11-17 MED ORDER — EPOETIN ALFA 40000 UNIT/ML IJ SOLN
INTRAMUSCULAR | Status: AC
  Administered 2016-11-17: 13:00:00 40000 [IU]
  Filled 2016-11-17: qty 1

## 2016-11-17 MED ORDER — EPOETIN ALFA 10000 UNIT/ML IJ SOLN: 40000.0000 [IU] | INTRAMUSCULAR | Status: DC

## 2016-11-22 ENCOUNTER — Inpatient Hospital Stay (HOSPITAL_BASED_OUTPATIENT_CLINIC_OR_DEPARTMENT_OTHER)
Admission: EM | Admit: 2016-11-22 | Discharge: 2016-11-25 | DRG: 389 | Disposition: A | Discharge status: Home / Self Care | Payer: Medicare Other | Attending: Family Medicine | Admitting: Family Medicine

## 2016-11-22 ENCOUNTER — Encounter (HOSPITAL_BASED_OUTPATIENT_CLINIC_OR_DEPARTMENT_OTHER): Payer: Self-pay | Admitting: Respiratory Therapy

## 2016-11-22 DIAGNOSIS — Z7901 Long term (current) use of anticoagulants: Secondary | ICD-10-CM

## 2016-11-22 DIAGNOSIS — Z940 Kidney transplant status: Secondary | ICD-10-CM | POA: Diagnosis not present

## 2016-11-22 DIAGNOSIS — Z905 Acquired absence of kidney: Secondary | ICD-10-CM

## 2016-11-22 DIAGNOSIS — M3214 Glomerular disease in systemic lupus erythematosus: Secondary | ICD-10-CM | POA: Diagnosis not present

## 2016-11-22 DIAGNOSIS — I4891 Unspecified atrial fibrillation: Secondary | ICD-10-CM | POA: Diagnosis present

## 2016-11-22 DIAGNOSIS — E039 Hypothyroidism, unspecified: Secondary | ICD-10-CM | POA: Diagnosis present

## 2016-11-22 DIAGNOSIS — Z9049 Acquired absence of other specified parts of digestive tract: Secondary | ICD-10-CM

## 2016-11-22 DIAGNOSIS — K56600 Partial intestinal obstruction, unspecified as to cause: Principal | ICD-10-CM | POA: Diagnosis present

## 2016-11-22 DIAGNOSIS — K469 Unspecified abdominal hernia without obstruction or gangrene: Secondary | ICD-10-CM | POA: Diagnosis present

## 2016-11-22 DIAGNOSIS — K219 Gastro-esophageal reflux disease without esophagitis: Secondary | ICD-10-CM | POA: Diagnosis present

## 2016-11-22 DIAGNOSIS — Z881 Allergy status to other antibiotic agents status: Secondary | ICD-10-CM

## 2016-11-22 DIAGNOSIS — Z0189 Encounter for other specified special examinations: Secondary | ICD-10-CM

## 2016-11-22 DIAGNOSIS — Z933 Colostomy status: Secondary | ICD-10-CM

## 2016-11-22 DIAGNOSIS — N183 Chronic kidney disease, stage 3 unspecified: Secondary | ICD-10-CM | POA: Diagnosis present

## 2016-11-22 DIAGNOSIS — I120 Hypertensive chronic kidney disease with stage 5 chronic kidney disease or end stage renal disease: Secondary | ICD-10-CM | POA: Diagnosis not present

## 2016-11-22 DIAGNOSIS — I2510 Atherosclerotic heart disease of native coronary artery without angina pectoris: Secondary | ICD-10-CM | POA: Diagnosis present

## 2016-11-22 DIAGNOSIS — Z9989 Dependence on other enabling machines and devices: Secondary | ICD-10-CM

## 2016-11-22 DIAGNOSIS — M1990 Unspecified osteoarthritis, unspecified site: Secondary | ICD-10-CM | POA: Diagnosis present

## 2016-11-22 DIAGNOSIS — Z880 Allergy status to penicillin: Secondary | ICD-10-CM

## 2016-11-22 DIAGNOSIS — Z79899 Other long term (current) drug therapy: Secondary | ICD-10-CM

## 2016-11-22 DIAGNOSIS — G4733 Obstructive sleep apnea (adult) (pediatric): Secondary | ICD-10-CM | POA: Diagnosis present

## 2016-11-22 DIAGNOSIS — K56609 Unspecified intestinal obstruction, unspecified as to partial versus complete obstruction: Secondary | ICD-10-CM

## 2016-11-22 DIAGNOSIS — N289 Disorder of kidney and ureter, unspecified: Secondary | ICD-10-CM | POA: Diagnosis not present

## 2016-11-22 DIAGNOSIS — Z882 Allergy status to sulfonamides status: Secondary | ICD-10-CM

## 2016-11-22 DIAGNOSIS — Z85528 Personal history of other malignant neoplasm of kidney: Secondary | ICD-10-CM

## 2016-11-22 DIAGNOSIS — G629 Polyneuropathy, unspecified: Secondary | ICD-10-CM | POA: Diagnosis present

## 2016-11-22 DIAGNOSIS — Z888 Allergy status to other drugs, medicaments and biological substances status: Secondary | ICD-10-CM

## 2016-11-22 DIAGNOSIS — I10 Essential (primary) hypertension: Secondary | ICD-10-CM | POA: Diagnosis present

## 2016-11-22 DIAGNOSIS — Z86718 Personal history of other venous thrombosis and embolism: Secondary | ICD-10-CM

## 2016-11-22 DIAGNOSIS — M3210 Systemic lupus erythematosus, organ or system involvement unspecified: Secondary | ICD-10-CM | POA: Diagnosis not present

## 2016-11-22 DIAGNOSIS — K5090 Crohn's disease, unspecified, without complications: Secondary | ICD-10-CM | POA: Diagnosis not present

## 2016-11-22 DIAGNOSIS — Z95828 Presence of other vascular implants and grafts: Secondary | ICD-10-CM

## 2016-11-22 DIAGNOSIS — R1013 Epigastric pain: Secondary | ICD-10-CM | POA: Diagnosis not present

## 2016-11-22 DIAGNOSIS — Z7952 Long term (current) use of systemic steroids: Secondary | ICD-10-CM

## 2016-11-22 DIAGNOSIS — M109 Gout, unspecified: Secondary | ICD-10-CM | POA: Diagnosis present

## 2016-11-22 DIAGNOSIS — D638 Anemia in other chronic diseases classified elsewhere: Secondary | ICD-10-CM | POA: Diagnosis present

## 2016-11-22 HISTORY — DX: Unspecified intestinal obstruction, unspecified as to partial versus complete obstruction: K56.609

## 2016-11-22 MED ORDER — ONDANSETRON HCL 4 MG/2ML IJ SOLN
INTRAMUSCULAR | Status: AC
  Administered 2016-11-22: 4 mg via INTRAVENOUS
  Filled 2016-11-22: qty 2

## 2016-11-22 NOTE — ED Triage Notes (Signed)
Pt reports sudden onset epigastric, RUQ pain for one day, associated nausea, known hernia.

## 2016-11-22 NOTE — ED Provider Notes (Signed)
[name] DEPT [facility] Provider Note   CSN: [id number] Arrival date & time: 11/22/16  2307  By signing my name below, I, Theresia Bough, attest that this documentation has been prepared under the direction and in the presence of Delora Fuel, MD. Electronically Signed: Theresia Bough, ED Scribe. 11/23/16. 12:06 AM.  History   Chief Complaint Chief Complaint  Patient presents with  . Abdominal Pain   The history is provided by the patient. No language interpreter was used.   HPI Comments: [name] is a 77 y.o. female with a PMHx of GERD and Crohn's Disease, and Renal CA, who presents to the Emergency Department complaining of non-radiating, epigastric abdominal pain onset this afternoon. Pt had a kidney transplant in 2006 and a hernia since. Pt was admitted to the hospital for similar symptoms 4 months ago. Pt reports associated nausea and vomiting. No modifying factors. Pt denies fever, chills, sweaats or any other complaints at this time.  Past Medical History:  Diagnosis Date  . Anemia    "when lupus flares up"  . Anxiety   . Arthritis    "in my joints"  . Atrial fibrillation ([place])   . CAP (community acquired pneumonia) 08/20/2014  . Crohn's disease ([place])   . DVT of leg (deep venous thrombosis) ([place]) 2012-8./13/2013   "have had one in each leg now"  . DVT of lower extremity, bilateral ([place])    "have had them in both legs; last one was on the RLE this year" (04/25/2012)  . GERD (gastroesophageal reflux disease)   . Gout 2016  . Hypertension   . Hypothyroidism   . Lower GI bleed 2012  . Numbness and tingling of foot    bilaterally  . OSA on CPAP   . Refusal of blood transfusion for reasons of conscience 12/20/2011   pt is Jehovah's Witness  . Renal cell carcinoma   . Renal disorder    S/P nephrectomy; dialysis; "working fine now" (12/20/11)  . SLE (systemic lupus erythematosus) ([place])     Patient Active Problem List   Diagnosis Date Noted  . SBO (small bowel  obstruction) ([place]) 08/06/2016  . Community acquired pneumonia   . Pleural effusion, left   . Streptococcal pneumonia ([place]) 08/20/2014  . GERD (gastroesophageal reflux disease) 08/20/2014  . SLE (systemic lupus erythematosus) ([place]) 08/20/2014  . OSA (obstructive sleep apnea) 08/20/2014  . Immunosuppressed status ([place]) 08/20/2014  . Sepsis ([place]) 08/20/2014  . Anemia of chronic disease 08/20/2014  . Syncope 08/20/2014  . History of DVT (deep vein thrombosis) 03/05/2014  . Allergic rhinitis 09/08/2013  . Aortic heart murmur 09/04/2013  . Crohn's disease ([place])   . Renal disorder   . Atrial fibrillation ([place]) 12/20/2011  . CKD (chronic kidney disease) stage 3, GFR 30-59 ml/min 12/20/2011  . History of renal transplant 12/20/2011  . HTN (hypertension) 12/20/2011  . Hypothyroidism 12/20/2011  . Hypersomnia with sleep apnea, unspecified 09/12/2007    Past Surgical History:  Procedure Laterality Date  . [place]   "found sluggish bone marrow" (04/25/2012)  . BONE MARROW BIOPSY  1993; 1995  . CATARACT EXTRACTION W/ INTRAOCULAR LENS  IMPLANT, BILATERAL  2005-2010  . CESAREAN SECTION  1984  . CHOLECYSTECTOMY  1995  . COLECTOMY  1998   "removal of abscess" (04/25/2012)  . COLONOSCOPY N/A 08/30/2013   Procedure: COLONOSCOPY;  Surgeon: Winfield Cunas., MD;  Location: [facility];  Service: Endoscopy;  Laterality: N/A;  . COLOSTOMY  1998  .  COLOSTOMY REVERSAL  1999   "6 months after placed" (04/25/2012)  . EXCISIONAL HEMORRHOIDECTOMY    . INSERTION OF DIALYSIS CATHETER  1988-2006   "peritoneal and hemodialysis; had multiple grafts and fistulas; last graft clotted off 2012"  . NEPHRECTOMY TRANSPLANTED ORGAN  2006   bilaterally  . PARATHYROID IMPLANT REMOVAL Left    removed parathyroid from neck and implanted in arm  . VENA CAVA FILTER PLACEMENT  2012    OB History    No data available       Home Medications    Prior to Admission medications     Medication Sig Start Date End Date Taking? Authorizing Provider  acetaminophen (TYLENOL) 325 MG tablet Take 650 mg by mouth every 6 (six) hours as needed for mild pain.    [provider]  allopurinol (ZYLOPRIM) 100 MG tablet Take 1 tablet (100 mg total) by mouth 2 (two) times daily. 07/18/16   Ann Held, DO  calcitRIOL (ROCALTROL) 0.25 MCG capsule Take 0.25 mcg by mouth daily.     [provider]  epoetin alfa (EPOGEN,PROCRIT) 09326 UNIT/ML injection Inject 40,000 Units into the skin every 21 ( twenty-one) days.     [provider]  feeding supplement, ENSURE ENLIVE, (ENSURE ENLIVE) LIQD Take 237 mLs by mouth 2 (two) times daily between meals. 08/26/14   Ahmed, Chesley Mires, MD  fluticasone (FLONASE) 50 MCG/ACT nasal spray Place 2 sprays into both nostrils daily. Patient taking differently: Place 2 sprays into both nostrils daily as needed for allergies.  09/25/15   Saguier, Percell Miller, PA-C  gabapentin (NEURONTIN) 100 MG capsule Take 2 capsules (200 mg total) by mouth at bedtime. 07/18/16   Ann Held, DO  levothyroxine (SYNTHROID, LEVOTHROID) 100 MCG tablet Take 1 tablet (100 mcg total) by mouth daily. 07/18/16   Carollee Herter, Yvonne R, DO  MAGNESIUM-OXIDE 400 (241.3 MG) MG tablet Take 400 mg by mouth 2 (two) times daily. 05/19/14   [provider]  metoprolol tartrate (LOPRESSOR) 25 MG tablet Take 1 tablet (25 mg total) by mouth 2 (two) times daily. 07/18/16   Ann Held, DO  mycophenolate (MYFORTIC) 180 MG EC tablet Take 360 mg by mouth 2 (two) times daily.      [provider]  NON FORMULARY 1 each by Other route See admin instructions. CPAP nightly    [provider]  NONFORMULARY OR COMPOUNDED ITEM Lipid , TSH----  DX hypothyroid, chronic kidney disease, stage 4 01/18/16   Ann Held, DO  NONFORMULARY OR COMPOUNDED ITEM Compression hose-- thigh high  20-30 mm hg  #1 as directed Dx-- varicose veins b/l with pain  01/18/16   Carollee Herter, Alferd Apa, DO  omeprazole (PRILOSEC) 20 MG capsule Take 20 mg by mouth daily. 07/20/16   [provider]  polyethylene glycol (MIRALAX / GLYCOLAX) packet Take 17 g by mouth 2 (two) times daily as needed for moderate constipation.     [provider]  predniSONE (DELTASONE) 5 MG tablet Take 5 mg by mouth daily.     [provider]  tacrolimus (PROGRAF) 1 MG capsule Take 4 mg by mouth 2 (two) times daily.     [provider]  warfarin (COUMADIN) 2.5 MG tablet Take 2.5-5 mg by mouth See admin instructions. Take 60m by mouth on Tuesday and Thursday. Take 2.5 mg by mouth on all other days    [provider]    Family History Family History  Problem Relation  Age of Onset  . Heart disease Mother   . Hypertension Unknown        runs in family  . Sleep apnea Sister     Social History Social History  Substance Use Topics  . Smoking status: Never Smoker  . Smokeless tobacco: Never Used  . Alcohol use No     Allergies   Amoxicillin; Ampicillin; Ciprofloxacin; Erythromycin; Penicillins; Sulfa antibiotics; Sulfamethoxazole-trimethoprim; Tetracycline; and Labetalol   Review of Systems Review of Systems  Constitutional: Negative for chills, diaphoresis and fever.  Gastrointestinal: Positive for abdominal pain, nausea and vomiting.  All other systems reviewed and are negative.    Physical Exam Updated Vital Signs BP (!) 198/87 (BP Location: Right Arm)   Pulse 78   Temp 98.2 F (36.8 C) (Oral)   Resp 15   Ht 5' 3"  (1.6 m)   Wt 135 lb (61.2 kg)   LMP 03/05/2014   SpO2 95%   BMI 23.91 kg/m   Physical Exam  Constitutional: She is oriented to person, place, and time. She appears well-developed and well-nourished.  Uncomfortable appearing and actively retching.   HENT:  Head: Normocephalic and atraumatic.  Eyes: Pupils are equal, round, and reactive to light. EOM are normal.  Neck: Normal range of motion. Neck  supple. No JVD present.  Cardiovascular: Normal rate, regular rhythm and normal heart sounds.   No murmur heard. Pulmonary/Chest: Effort normal and breath sounds normal. She has no wheezes. She has no rales. She exhibits no tenderness.  Abdominal: Soft. She exhibits no distension and no mass. There is tenderness. There is no rebound and no guarding.  Mild tenderness across the upper abdomen worse across the RUQ. No rebound or guarding. Bowel sounds decreased.   Musculoskeletal: Normal range of motion. She exhibits no edema.  Lymphadenopathy:    She has no cervical adenopathy.  Neurological: She is alert and oriented to person, place, and time. No cranial nerve deficit. She exhibits normal muscle tone. Coordination normal.  Skin: Skin is warm and dry. No rash noted.  Psychiatric: She has a normal mood and affect. Her behavior is normal. Judgment and thought content normal.  Nursing note and vitals reviewed.    ED Treatments / Results  DIAGNOSTIC STUDIES: Oxygen Saturation is 95% on RA, adequate by my interpretation.   COORDINATION OF CARE: 12:02 AM-Discussed next steps with pt including lab work. Pt verbalized understanding and is agreeable with the plan.   Labs (all labs ordered are listed, but only abnormal results are displayed) Labs Reviewed  COMPREHENSIVE METABOLIC PANEL - Abnormal; Notable for the following:       Result Value   Potassium 5.3 (*)    Glucose, Bld 130 (*)    BUN 42 (*)    Creatinine, Ser 1.96 (*)    ALT 13 (*)    Alkaline Phosphatase 182 (*)    GFR calc non Af Amer 24 (*)    GFR calc Af Amer 28 (*)    All other components within normal limits  CBC WITH DIFFERENTIAL/PLATELET - Abnormal; Notable for the following:    RDW 16.6 (*)    Monocytes Absolute 1.1 (*)    All other components within normal limits  URINALYSIS, ROUTINE W REFLEX MICROSCOPIC - Abnormal; Notable for the following:    Leukocytes, UA MODERATE (*)    All other components within normal  limits  URINALYSIS, MICROSCOPIC (REFLEX) - Abnormal; Notable for the following:    Bacteria, UA RARE (*)    Squamous Epithelial /  LPF 0-5 (*)    All other components within normal limits  LIPASE, BLOOD    EKG  EKG Interpretation  Date/Time:  Tuesday November 22 2016 23:41:31 EDT Ventricular Rate:  70 PR Interval:    QRS Duration: 129 QT Interval:  434 QTC Calculation: 469 R Axis:   67 Text Interpretation:  Sinus rhythm Right bundle branch block Minimal ST elevation, lateral leads When compared with ECG of 11/12/2014, No significant change was found Confirmed by Delora Fuel ([id number]) on 11/22/2016 11:46:30 PM       Radiology Ct Abdomen Pelvis Wo Contrast  Result Date: 11/23/2016 CLINICAL DATA:  Sudden epigastric and right upper quadrant pain x1 day with nausea. History of Crohn's disease and renal cell carcinoma with renal transplant in 2006. Known hernia. EXAM: CT ABDOMEN AND PELVIS WITHOUT CONTRAST TECHNIQUE: Multidetector CT imaging of the abdomen and pelvis was performed following the standard protocol without IV contrast. COMPARISON:  None. FINDINGS: Lower chest: The included heart is top-normal in size without pericardial effusion. There is three-vessel coronary arteriosclerosis and thoracic aortic atherosclerosis. Scarring and/or atelectasis is seen at each lung base. Calcified pleural plaque is noted at the right lung base. Hepatobiliary: 8 mm subcapsular hypodensity in the right hepatic lobe, stable in appearance consistent with a cyst or hemangioma but too small to further characterize. Status post cholecystectomy. Unchanged biliary dilatation at the porta hepatis. No calcified choledocholithiasis. Pancreas: Atrophy of the pancreas.  No inflammation or focal mass. Spleen: Normal splenic size with scattered calcified granulomas. Linear subcapsular calcifications are stable. Adrenals/Urinary Tract: Bilateral nephrectomies. No adrenal masses. Transplanted right lower quadrant kidney without  obstructive uropathy or focal mass. No nephrolithiasis. Contracted urinary bladder without stones. Stomach/Bowel: The stomach is physiologically distended. There is a small stable hiatal hernia. There is normal small bowel rotation noted. Mild to moderate fluid-filled distention of small bowel loops in part due to lateral small and large bowel containing hernias in the right lower quadrant. No incarceration identified. Colonic diverticulosis without acute diverticulitis. Vascular/Lymphatic: IVC filter in place. Advanced atherosclerosis of the abdominal aorta and branch vessels. No aneurysm. Reproductive: Atrophic uterus.  No adnexal mass. Other: Scarring along the anterior abdominal wall with small fat containing upper abdominal ventral abdominal wall hernia. Injection granulomata overlying the right gluteal region. Soft tissue induration bilaterally overlies the lower gluteal muscles. Musculoskeletal: Discogenic sclerosis at T10-11 with endplate irregularity, stable in appearance. Stable trabecular thickening of the posterior elements at L1. Chronic degenerative change about both hips with periarticular and intra-articular loose bodies. No acute nor suspicious osseous abnormalities. IMPRESSION: 1. Similar to prior exam are fluid-filled dilated pelvic small bowel loops likely in part due to known right small and large bowel containing lower quadrant hernias. No evidence of incarceration. This is contributing to moderate small bowel dilatation up to 3.5 cm and may reflect a partial SBO or early SBO. 2. Other chronic stable findings as above. Electronically Signed   By: Ashley Royalty M.D.   On: 11/23/2016 02:16    Procedures Procedures (including critical care time)  Medications Ordered in ED Medications  ondansetron (ZOFRAN) 4 MG/2ML injection (4 mg  Given 11/22/16 2357)  morphine 4 MG/ML injection 4 mg (4 mg Intravenous Given 11/23/16 0050)  morphine 4 MG/ML injection 4 mg (4 mg Intravenous Given 11/23/16 0236)   morphine 4 MG/ML injection 4 mg (4 mg Intravenous Given 11/23/16 0426)  ondansetron (ZOFRAN) injection 4 mg (4 mg Intravenous Given 11/23/16 0426)     Initial Impression / Assessment and  Plan / ED Course  I have reviewed the triage vital signs and the nursing notes.  Pertinent labs & imaging results that were available during my care of the patient were reviewed by me and considered in my medical decision making (see chart for details).  Abdominal pain and nausea and dry heaves. Old records are reviewed, and she does have history of partial small bowel obstruction related to incisional hernias. IV fluids are given and she is given morphine and ondansetron with partial relief of symptoms. Pain and nausea continued to recur. She was sent for CT of abdomen and pelvis which did show incisional hernias without signs of strangulation, and partial small bowel obstruction. Laboratory workup showed renal insufficiency which is in the same [place] that her kidney function has been at in the past. 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 Decision was made to admit because of inability to control pain and nausea. Case is discussed with Dr. Tamala Julian [facility] who agrees to accept the patient in transfer.  Final Clinical Impressions(s) / ED Diagnoses   Final diagnoses:  Partial small bowel obstruction (HCC)  Renal insufficiency    New Prescriptions New Prescriptions   No medications on file   I personally performed the services described in this documentation, which was scribed in my presence. The recorded information has been reviewed and is accurate.       Delora Fuel, MD 40/34/74 [phone]

## 2016-11-22 NOTE — ED Notes (Signed)
Pt on monitor 

## 2016-11-23 ENCOUNTER — Emergency Department (HOSPITAL_BASED_OUTPATIENT_CLINIC_OR_DEPARTMENT_OTHER): Payer: Medicare Other

## 2016-11-23 ENCOUNTER — Encounter (HOSPITAL_BASED_OUTPATIENT_CLINIC_OR_DEPARTMENT_OTHER): Payer: Self-pay | Admitting: Emergency Medicine

## 2016-11-23 ENCOUNTER — Inpatient Hospital Stay (HOSPITAL_COMMUNITY): Payer: Medicare Other

## 2016-11-23 DIAGNOSIS — D638 Anemia in other chronic diseases classified elsewhere: Secondary | ICD-10-CM

## 2016-11-23 DIAGNOSIS — G629 Polyneuropathy, unspecified: Secondary | ICD-10-CM | POA: Diagnosis present

## 2016-11-23 DIAGNOSIS — G4733 Obstructive sleep apnea (adult) (pediatric): Secondary | ICD-10-CM | POA: Diagnosis present

## 2016-11-23 DIAGNOSIS — I2510 Atherosclerotic heart disease of native coronary artery without angina pectoris: Secondary | ICD-10-CM | POA: Diagnosis present

## 2016-11-23 DIAGNOSIS — M3214 Glomerular disease in systemic lupus erythematosus: Secondary | ICD-10-CM | POA: Diagnosis present

## 2016-11-23 DIAGNOSIS — N183 Chronic kidney disease, stage 3 (moderate): Secondary | ICD-10-CM | POA: Diagnosis not present

## 2016-11-23 DIAGNOSIS — K219 Gastro-esophageal reflux disease without esophagitis: Secondary | ICD-10-CM

## 2016-11-23 DIAGNOSIS — I10 Essential (primary) hypertension: Secondary | ICD-10-CM | POA: Diagnosis not present

## 2016-11-23 DIAGNOSIS — Z7901 Long term (current) use of anticoagulants: Secondary | ICD-10-CM | POA: Diagnosis not present

## 2016-11-23 DIAGNOSIS — E039 Hypothyroidism, unspecified: Secondary | ICD-10-CM | POA: Diagnosis present

## 2016-11-23 DIAGNOSIS — Z940 Kidney transplant status: Secondary | ICD-10-CM | POA: Diagnosis not present

## 2016-11-23 DIAGNOSIS — Z4682 Encounter for fitting and adjustment of non-vascular catheter: Secondary | ICD-10-CM | POA: Diagnosis not present

## 2016-11-23 DIAGNOSIS — K56600 Partial intestinal obstruction, unspecified as to cause: Secondary | ICD-10-CM | POA: Diagnosis present

## 2016-11-23 DIAGNOSIS — K469 Unspecified abdominal hernia without obstruction or gangrene: Secondary | ICD-10-CM | POA: Diagnosis present

## 2016-11-23 DIAGNOSIS — M3210 Systemic lupus erythematosus, organ or system involvement unspecified: Secondary | ICD-10-CM | POA: Diagnosis present

## 2016-11-23 DIAGNOSIS — Z85528 Personal history of other malignant neoplasm of kidney: Secondary | ICD-10-CM | POA: Diagnosis not present

## 2016-11-23 DIAGNOSIS — K5090 Crohn's disease, unspecified, without complications: Secondary | ICD-10-CM | POA: Diagnosis present

## 2016-11-23 DIAGNOSIS — R1013 Epigastric pain: Secondary | ICD-10-CM | POA: Diagnosis not present

## 2016-11-23 DIAGNOSIS — Z905 Acquired absence of kidney: Secondary | ICD-10-CM | POA: Diagnosis not present

## 2016-11-23 DIAGNOSIS — Z933 Colostomy status: Secondary | ICD-10-CM | POA: Diagnosis not present

## 2016-11-23 DIAGNOSIS — Z0189 Encounter for other specified special examinations: Secondary | ICD-10-CM | POA: Diagnosis not present

## 2016-11-23 DIAGNOSIS — I120 Hypertensive chronic kidney disease with stage 5 chronic kidney disease or end stage renal disease: Secondary | ICD-10-CM | POA: Diagnosis present

## 2016-11-23 DIAGNOSIS — K56609 Unspecified intestinal obstruction, unspecified as to partial versus complete obstruction: Secondary | ICD-10-CM | POA: Diagnosis not present

## 2016-11-23 DIAGNOSIS — I4891 Unspecified atrial fibrillation: Secondary | ICD-10-CM | POA: Diagnosis not present

## 2016-11-23 DIAGNOSIS — Z9049 Acquired absence of other specified parts of digestive tract: Secondary | ICD-10-CM | POA: Diagnosis not present

## 2016-11-23 DIAGNOSIS — Z79899 Other long term (current) drug therapy: Secondary | ICD-10-CM | POA: Diagnosis not present

## 2016-11-23 DIAGNOSIS — K50918 Crohn's disease, unspecified, with other complication: Secondary | ICD-10-CM

## 2016-11-23 DIAGNOSIS — M1990 Unspecified osteoarthritis, unspecified site: Secondary | ICD-10-CM | POA: Diagnosis present

## 2016-11-23 DIAGNOSIS — M109 Gout, unspecified: Secondary | ICD-10-CM | POA: Diagnosis present

## 2016-11-23 DIAGNOSIS — N289 Disorder of kidney and ureter, unspecified: Secondary | ICD-10-CM | POA: Diagnosis not present

## 2016-11-23 DIAGNOSIS — Z86718 Personal history of other venous thrombosis and embolism: Secondary | ICD-10-CM | POA: Diagnosis not present

## 2016-11-23 LAB — URINALYSIS, ROUTINE W REFLEX MICROSCOPIC
Bilirubin Urine: NEGATIVE
GLUCOSE, UA: NEGATIVE mg/dL
Hgb urine dipstick: NEGATIVE
Ketones, ur: NEGATIVE mg/dL
NITRITE: NEGATIVE
PROTEIN: NEGATIVE mg/dL
Specific Gravity, Urine: 1.014 (ref 1.005–1.030)
pH: 6 (ref 5.0–8.0)

## 2016-11-23 LAB — CBC WITH DIFFERENTIAL/PLATELET
BASOS ABS: 0 10*3/uL (ref 0.0–0.1)
Basophils Relative: 0 %
Eosinophils Absolute: 0.2 10*3/uL (ref 0.0–0.7)
Eosinophils Relative: 2 %
HCT: 38.6 % (ref 36.0–46.0)
Hemoglobin: 12 g/dL (ref 12.0–15.0)
LYMPHS PCT: 16 %
Lymphs Abs: 1.4 10*3/uL (ref 0.7–4.0)
MCH: 29 pg (ref 26.0–34.0)
MCHC: 31.1 g/dL (ref 30.0–36.0)
MCV: 93.2 fL (ref 78.0–100.0)
Monocytes Absolute: 1.1 10*3/uL — ABNORMAL HIGH (ref 0.1–1.0)
Monocytes Relative: 12 %
NEUTROS ABS: 6.4 10*3/uL (ref 1.7–7.7)
Neutrophils Relative %: 70 %
PLATELETS: 162 10*3/uL (ref 150–400)
RBC: 4.14 MIL/uL (ref 3.87–5.11)
RDW: 16.6 % — ABNORMAL HIGH (ref 11.5–15.5)
WBC: 9.1 10*3/uL (ref 4.0–10.5)

## 2016-11-23 LAB — COMPREHENSIVE METABOLIC PANEL
ALT: 13 U/L — ABNORMAL LOW (ref 14–54)
AST: 16 U/L (ref 15–41)
Albumin: 4 g/dL (ref 3.5–5.0)
Alkaline Phosphatase: 182 U/L — ABNORMAL HIGH (ref 38–126)
Anion gap: 10 (ref 5–15)
BUN: 42 mg/dL — ABNORMAL HIGH (ref 6–20)
CHLORIDE: 104 mmol/L (ref 101–111)
CO2: 22 mmol/L (ref 22–32)
CREATININE: 1.96 mg/dL — AB (ref 0.44–1.00)
Calcium: 9.2 mg/dL (ref 8.9–10.3)
GFR, EST AFRICAN AMERICAN: 28 mL/min — AB (ref >60)
GFR, EST NON AFRICAN AMERICAN: 24 mL/min — AB (ref >60)
Glucose, Bld: 130 mg/dL — ABNORMAL HIGH (ref 65–99)
POTASSIUM: 5.3 mmol/L — AB (ref 3.5–5.1)
Sodium: 136 mmol/L (ref 135–145)
Total Bilirubin: 0.4 mg/dL (ref 0.3–1.2)
Total Protein: 6.8 g/dL (ref 6.5–8.1)

## 2016-11-23 LAB — LACTIC ACID, PLASMA: Lactic Acid, Venous: 1.1 mmol/L (ref 0.5–1.9)

## 2016-11-23 LAB — URINALYSIS, MICROSCOPIC (REFLEX)

## 2016-11-23 LAB — LIPASE, BLOOD: LIPASE: 32 U/L (ref 11–51)

## 2016-11-23 MED ORDER — ACETAMINOPHEN 325 MG PO TABS
650.0000 mg | ORAL_TABLET | Freq: Four times a day (QID) | ORAL | Status: DC | PRN
  Administered 2016-11-25: 650 mg via ORAL
  Filled 2016-11-23: qty 2

## 2016-11-23 MED ORDER — MORPHINE SULFATE (PF) 4 MG/ML IV SOLN
4.0000 mg | Freq: Once | INTRAVENOUS | Status: AC
  Administered 2016-11-23: 4 mg via INTRAVENOUS
  Filled 2016-11-23: qty 1

## 2016-11-23 MED ORDER — SODIUM CHLORIDE 0.9 % IV SOLN
INTRAVENOUS | Status: AC
  Administered 2016-11-23: 06:00:00 via INTRAVENOUS

## 2016-11-23 MED ORDER — HYDRALAZINE HCL 20 MG/ML IJ SOLN
5.0000 mg | INTRAMUSCULAR | Status: DC | PRN
  Administered 2016-11-23: 10 mg via INTRAVENOUS
  Filled 2016-11-23: qty 1

## 2016-11-23 MED ORDER — ONDANSETRON HCL 4 MG/2ML IJ SOLN
4.0000 mg | Freq: Once | INTRAMUSCULAR | Status: AC
  Administered 2016-11-22 – 2016-11-23 (×2): 4 mg via INTRAVENOUS
  Filled 2016-11-23: qty 2

## 2016-11-23 MED ORDER — MORPHINE SULFATE (PF) 4 MG/ML IV SOLN: 4.0000 mg | INTRAVENOUS | Status: DC | PRN

## 2016-11-23 MED ORDER — SODIUM CHLORIDE 0.9 % IV SOLN: INTRAVENOUS | Status: DC

## 2016-11-23 MED ORDER — ONDANSETRON HCL 4 MG/2ML IJ SOLN
4.0000 mg | Freq: Four times a day (QID) | INTRAMUSCULAR | Status: DC | PRN
  Administered 2016-11-23 (×3): 4 mg via INTRAVENOUS
  Filled 2016-11-23 (×3): qty 2

## 2016-11-23 MED ORDER — MORPHINE SULFATE (PF) 4 MG/ML IV SOLN
2.0000 mg | INTRAVENOUS | Status: DC | PRN
  Administered 2016-11-23 – 2016-11-24 (×5): 2 mg via INTRAVENOUS
  Filled 2016-11-23 (×5): qty 1

## 2016-11-23 MED ORDER — SODIUM CHLORIDE 0.9 % IV SOLN
10.0000 mg | Freq: Two times a day (BID) | INTRAVENOUS | Status: DC
  Administered 2016-11-23 – 2016-11-24 (×3): 10 mg via INTRAVENOUS
  Filled 2016-11-23 (×4): qty 1

## 2016-11-23 MED ORDER — MORPHINE SULFATE (PF) 2 MG/ML IV SOLN
2.0000 mg | INTRAVENOUS | Status: DC | PRN
Start: 2016-11-23 — End: 2016-11-23

## 2016-11-23 MED ORDER — ACETAMINOPHEN 650 MG RE SUPP: 650.0000 mg | Freq: Four times a day (QID) | RECTAL | Status: DC | PRN

## 2016-11-23 MED ORDER — METOPROLOL TARTRATE 5 MG/5ML IV SOLN
5.0000 mg | Freq: Four times a day (QID) | INTRAVENOUS | Status: DC
  Administered 2016-11-23 – 2016-11-24 (×4): 5 mg via INTRAVENOUS
  Filled 2016-11-23 (×5): qty 5

## 2016-11-23 MED ORDER — DIATRIZOATE MEGLUMINE & SODIUM 66-10 % PO SOLN
90.0000 mL | Freq: Once | ORAL | Status: AC
  Administered 2016-11-23: 90 mL via NASOGASTRIC
  Filled 2016-11-23: qty 90

## 2016-11-23 MED ORDER — SODIUM CHLORIDE 0.9 % IV SOLN
INTRAVENOUS | Status: DC
  Administered 2016-11-23 – 2016-11-25 (×3): via INTRAVENOUS

## 2016-11-23 MED ORDER — METHYLPREDNISOLONE SODIUM SUCC 40 MG IJ SOLR
5.0000 mg | Freq: Every day | INTRAMUSCULAR | Status: DC
  Administered 2016-11-23 – 2016-11-25 (×3): 5.2 mg via INTRAVENOUS
  Filled 2016-11-23 (×3): qty 1

## 2016-11-23 MED ORDER — PROMETHAZINE HCL 25 MG/ML IJ SOLN
12.5000 mg | Freq: Four times a day (QID) | INTRAMUSCULAR | Status: DC | PRN
  Administered 2016-11-23: 25 mg via INTRAVENOUS
  Administered 2016-11-23: 12.5 mg via INTRAVENOUS
  Filled 2016-11-23 (×2): qty 1

## 2016-11-23 MED ORDER — ONDANSETRON HCL 4 MG/2ML IJ SOLN
4.0000 mg | Freq: Three times a day (TID) | INTRAMUSCULAR | Status: DC | PRN
  Administered 2016-11-23: 4 mg via INTRAVENOUS
  Filled 2016-11-23 (×2): qty 2

## 2016-11-23 MED ORDER — ENOXAPARIN SODIUM 30 MG/0.3ML ~~LOC~~ SOLN
30.0000 mg | SUBCUTANEOUS | Status: DC
  Administered 2016-11-23 – 2016-11-24 (×2): 30 mg via SUBCUTANEOUS
  Filled 2016-11-23 (×2): qty 0.3

## 2016-11-23 NOTE — ED Notes (Signed)
ED Provider at bedside. 

## 2016-11-23 NOTE — H&P (Signed)
History and Physical    [name] AST:[id number] DOB: [birth year] DOA: 11/22/2016  PCP: Ann Held, DO Patient coming from: Home  Chief Complaint: abdominal pain  HPI: [name] is a 76 y.o. female with medical history significant of SLE, renal cell carcinoma status post nephrectomy and renal transplant, CK-MB, GI bleed, hypothyroidism, hypertension, gout, GERD, DVT, Crohn's disease, atrial fibrillation, anemia. Presenting w/ 1 day h/o epigastric abdominal pain. No radiation. Associated w/ N/V. Pain is similar to previous SBOs. Last SBO in March. Sx are getting worse. Unable to eat, drink or take medications. Waxing and waning nature of his pain. Denies chest pain, palpitations, shortness of breath, fevers, dysuria, frequency, flank pain, and axis, headache, focal neurological deficit.  ED Course: Patient found to have SBO on imaging. Objective findings outlined below. Given fluids and Zofran.  Review of Systems: As per HPI otherwise all other systems reviewed and are negative  Ambulatory Status:no restrictions.   Past Medical History:  Diagnosis Date  . Anemia    "when lupus flares up"  . Anxiety   . Arthritis    "in my joints"  . Atrial fibrillation ([place])   . CAP (community acquired pneumonia) 08/20/2014  . Crohn's disease ([place])   . DVT of leg (deep venous thrombosis) ([place]) 2012-8./13/2013   "have had one in each leg now"  . DVT of lower extremity, bilateral ([place])    "have had them in both legs; last one was on the RLE this year" (04/25/2012)  . GERD (gastroesophageal reflux disease)   . Gout 2016  . Hypertension   . Hypothyroidism   . Lower GI bleed 2012  . Numbness and tingling of foot    bilaterally  . OSA on CPAP   . Refusal of blood transfusion for reasons of conscience 12/20/2011   pt is Jehovah's Witness  . Renal cell carcinoma   . Renal disorder    S/P nephrectomy; dialysis; "working fine now" (12/20/11)  . SLE (systemic lupus erythematosus)  ([place])     Past Surgical History:  Procedure Laterality Date  . [place]   "found sluggish bone marrow" (04/25/2012)  . BONE MARROW BIOPSY  1993; 1995  . CATARACT EXTRACTION W/ INTRAOCULAR LENS  IMPLANT, BILATERAL  2005-2010  . CESAREAN SECTION  1984  . CHOLECYSTECTOMY  1995  . COLECTOMY  1998   "removal of abscess" (04/25/2012)  . COLONOSCOPY N/A 08/30/2013   Procedure: COLONOSCOPY;  Surgeon: Winfield Cunas., MD;  Location: [facility];  Service: Endoscopy;  Laterality: N/A;  . COLOSTOMY  1998  . COLOSTOMY REVERSAL  1999   "6 months after placed" (04/25/2012)  . EXCISIONAL HEMORRHOIDECTOMY    . INSERTION OF DIALYSIS CATHETER  1988-2006   "peritoneal and hemodialysis; had multiple grafts and fistulas; last graft clotted off 2012"  . NEPHRECTOMY TRANSPLANTED ORGAN  2006   bilaterally  . PARATHYROID IMPLANT REMOVAL Left    removed parathyroid from neck and implanted in arm  . VENA CAVA FILTER PLACEMENT  2012    Social History   Social History  . Marital status: Married    Spouse name: N/A  . Number of children: 3  . Years of education: N/A   Occupational History  . retired-bank teller Retired   Social History Main Topics  . Smoking status: Never Smoker  . Smokeless tobacco: Never Used  . Alcohol use No  . Drug use: No  . Sexual activity: Yes    Birth control/  protection: Post-menopausal   Other Topics Concern  . Not on file   Social History Narrative  . No narrative on file    Allergies  Allergen Reactions  . Amoxicillin Anaphylaxis  . Ampicillin Anaphylaxis  . Ciprofloxacin Swelling and Other (See Comments)    "of my throat"  . Erythromycin Anaphylaxis  . Penicillins Anaphylaxis  . Sulfa Antibiotics Itching  . Sulfamethoxazole-Trimethoprim Itching  . Tetracycline Anaphylaxis  . Labetalol Nausea And Vomiting    Family History  Problem Relation Age of Onset  . Heart disease Mother   . Hypertension Unknown        runs  in family  . Sleep apnea Sister       Prior to Admission medications   Medication Sig Start Date End Date Taking? Authorizing Provider  acetaminophen (TYLENOL) 325 MG tablet Take 650 mg by mouth every 6 (six) hours as needed for mild pain.    [provider]  allopurinol (ZYLOPRIM) 100 MG tablet Take 1 tablet (100 mg total) by mouth 2 (two) times daily. 07/18/16   Ann Held, DO  calcitRIOL (ROCALTROL) 0.25 MCG capsule Take 0.25 mcg by mouth daily.     [provider]  epoetin alfa (EPOGEN,PROCRIT) 17616 UNIT/ML injection Inject 40,000 Units into the skin every 21 ( twenty-one) days.     [provider]  feeding supplement, ENSURE ENLIVE, (ENSURE ENLIVE) LIQD Take 237 mLs by mouth 2 (two) times daily between meals. 08/26/14   Ahmed, Chesley Mires, MD  fluticasone (FLONASE) 50 MCG/ACT nasal spray Place 2 sprays into both nostrils daily. Patient taking differently: Place 2 sprays into both nostrils daily as needed for allergies.  09/25/15   Saguier, Percell Miller, PA-C  gabapentin (NEURONTIN) 100 MG capsule Take 2 capsules (200 mg total) by mouth at bedtime. 07/18/16   Ann Held, DO  levothyroxine (SYNTHROID, LEVOTHROID) 100 MCG tablet Take 1 tablet (100 mcg total) by mouth daily. 07/18/16   Carollee Herter, Yvonne R, DO  MAGNESIUM-OXIDE 400 (241.3 MG) MG tablet Take 400 mg by mouth 2 (two) times daily. 05/19/14   [provider]  metoprolol tartrate (LOPRESSOR) 25 MG tablet Take 1 tablet (25 mg total) by mouth 2 (two) times daily. 07/18/16   Ann Held, DO  mycophenolate (MYFORTIC) 180 MG EC tablet Take 360 mg by mouth 2 (two) times daily.      [provider]  NON FORMULARY 1 each by Other route See admin instructions. CPAP nightly    [provider]  NONFORMULARY OR COMPOUNDED ITEM Lipid , TSH----  DX hypothyroid, chronic kidney disease, stage 4 01/18/16   Ann Held, DO  NONFORMULARY OR COMPOUNDED ITEM Compression hose--  thigh high  20-30 mm hg  #1 as directed Dx-- varicose veins b/l with pain 01/18/16   Carollee Herter, Alferd Apa, DO  omeprazole (PRILOSEC) 20 MG capsule Take 20 mg by mouth daily. 07/20/16   [provider]  polyethylene glycol (MIRALAX / GLYCOLAX) packet Take 17 g by mouth 2 (two) times daily as needed for moderate constipation.     [provider]  predniSONE (DELTASONE) 5 MG tablet Take 5 mg by mouth daily.     [provider]  tacrolimus (PROGRAF) 1 MG capsule Take 4 mg by mouth 2 (two) times daily.     [provider]  warfarin (COUMADIN) 2.5 MG tablet Take 2.5-5 mg by mouth See admin instructions. Take 71m by mouth on Tuesday and Thursday. Take 2.5  mg by mouth on all other days    [provider]    Physical Exam: Vitals:   11/23/16 0300 11/23/16 0400 11/23/16 0500 11/23/16 0638  BP: (!) 162/75 (!) 169/87 (!) 173/83 (!) 151/76  Pulse: 78 85 88 83  Resp: 18 14 15 16   Temp:    98.1 F (36.7 C)  TempSrc:    Oral  SpO2: 99% 99% 96% 98%  Weight:    63.3 kg (139 lb 8.8 oz)  Height:    5' 3"  (1.6 m)     General: At times appears comfortable resting in bed but then will set up and vomit with clear distress. Eyes:  PERRL, EOMI, normal lids, iris ENT:  grossly normal hearing, lips & tongue, mmm Neck:  no LAD, masses or thyromegaly Cardiovascular:  RRR, no m/r/g. No LE edema.  Respiratory:  CTA bilaterally, no w/r/r. Normal respiratory effort. Abdomen: Mild distention, diffusely tender, hypoactive bowel sounds Skin:  no rash or induration seen on limited exam Musculoskeletal:  grossly normal tone BUE/BLE, good ROM, no bony abnormality Psychiatric:  grossly normal mood and affect, speech fluent and appropriate, AOx3 Neurologic:  CN 2-12 grossly intact, moves all extremities in coordinated fashion, sensation intact  Labs on Admission: I have personally reviewed following labs and imaging studies  CBC:  Recent Labs Lab 11/17/16 1316  11/22/16 2350  WBC  --  9.1  NEUTROABS  --  6.4  HGB 11.1* 12.0  HCT  --  38.6  MCV  --  93.2  PLT  --  315   Basic Metabolic Panel:  Recent Labs Lab 11/22/16 2350  NA 136  K 5.3*  CL 104  CO2 22  GLUCOSE 130*  BUN 42*  CREATININE 1.96*  CALCIUM 9.2   GFR: Estimated Creatinine Clearance: 22.9 mL/min (A) (by C-G formula based on SCr of 1.96 mg/dL (H)). Liver Function Tests:  Recent Labs Lab 11/22/16 2350  AST 16  ALT 13*  ALKPHOS 182*  BILITOT 0.4  PROT 6.8  ALBUMIN 4.0    Recent Labs Lab 11/22/16 2350  LIPASE 32   No results for input(s): AMMONIA in the last 168 hours. Coagulation Profile: No results for input(s): INR, PROTIME in the last 168 hours. Cardiac Enzymes: No results for input(s): CKTOTAL, CKMB, CKMBINDEX, TROPONINI in the last 168 hours. BNP (last 3 results) No results for input(s): PROBNP in the last 8760 hours. HbA1C: No results for input(s): HGBA1C in the last 72 hours. CBG: No results for input(s): GLUCAP in the last 168 hours. Lipid Profile: No results for input(s): CHOL, HDL, LDLCALC, TRIG, CHOLHDL, LDLDIRECT in the last 72 hours. Thyroid Function Tests: No results for input(s): TSH, T4TOTAL, FREET4, T3FREE, THYROIDAB in the last 72 hours. Anemia Panel: No results for input(s): VITAMINB12, FOLATE, FERRITIN, TIBC, IRON, RETICCTPCT in the last 72 hours. Urine analysis:    Component Value Date/Time   COLORURINE YELLOW 11/23/2016 0046   APPEARANCEUR CLEAR 11/23/2016 0046   LABSPEC 1.014 11/23/2016 0046   PHURINE 6.0 11/23/2016 0046   GLUCOSEU NEGATIVE 11/23/2016 0046   HGBUR NEGATIVE 11/23/2016 0046   BILIRUBINUR NEGATIVE 11/23/2016 0046   BILIRUBINUR neg 09/02/2015 1124   KETONESUR NEGATIVE 11/23/2016 0046   PROTEINUR NEGATIVE 11/23/2016 0046   UROBILINOGEN 0.2 09/02/2015 1124   UROBILINOGEN 0.2 08/23/2014 0156   NITRITE NEGATIVE 11/23/2016 0046   LEUKOCYTESUR MODERATE (A) 11/23/2016 0046    Creatinine Clearance: Estimated  Creatinine Clearance: 22.9 mL/min (A) (by C-G formula based on SCr of 1.96  mg/dL (H)).  Sepsis Labs: @LABRCNTIP (procalcitonin:4,lacticidven:4) )No results found for this or any previous visit (from the past 240 hour(s)).   Radiological Exams on Admission: Ct Abdomen Pelvis Wo Contrast  Result Date: 11/23/2016 CLINICAL DATA:  Sudden epigastric and right upper quadrant pain x1 day with nausea. History of Crohn's disease and renal cell carcinoma with renal transplant in 2006. Known hernia. EXAM: CT ABDOMEN AND PELVIS WITHOUT CONTRAST TECHNIQUE: Multidetector CT imaging of the abdomen and pelvis was performed following the standard protocol without IV contrast. COMPARISON:  None. FINDINGS: Lower chest: The included heart is top-normal in size without pericardial effusion. There is three-vessel coronary arteriosclerosis and thoracic aortic atherosclerosis. Scarring and/or atelectasis is seen at each lung base. Calcified pleural plaque is noted at the right lung base. Hepatobiliary: 8 mm subcapsular hypodensity in the right hepatic lobe, stable in appearance consistent with a cyst or hemangioma but too small to further characterize. Status post cholecystectomy. Unchanged biliary dilatation at the porta hepatis. No calcified choledocholithiasis. Pancreas: Atrophy of the pancreas.  No inflammation or focal mass. Spleen: Normal splenic size with scattered calcified granulomas. Linear subcapsular calcifications are stable. Adrenals/Urinary Tract: Bilateral nephrectomies. No adrenal masses. Transplanted right lower quadrant kidney without obstructive uropathy or focal mass. No nephrolithiasis. Contracted urinary bladder without stones. Stomach/Bowel: The stomach is physiologically distended. There is a small stable hiatal hernia. There is normal small bowel rotation noted. Mild to moderate fluid-filled distention of small bowel loops in part due to lateral small and large bowel containing hernias in the right lower  quadrant. No incarceration identified. Colonic diverticulosis without acute diverticulitis. Vascular/Lymphatic: IVC filter in place. Advanced atherosclerosis of the abdominal aorta and branch vessels. No aneurysm. Reproductive: Atrophic uterus.  No adnexal mass. Other: Scarring along the anterior abdominal wall with small fat containing upper abdominal ventral abdominal wall hernia. Injection granulomata overlying the right gluteal region. Soft tissue induration bilaterally overlies the lower gluteal muscles. Musculoskeletal: Discogenic sclerosis at T10-11 with endplate irregularity, stable in appearance. Stable trabecular thickening of the posterior elements at L1. Chronic degenerative change about both hips with periarticular and intra-articular loose bodies. No acute nor suspicious osseous abnormalities. IMPRESSION: 1. Similar to prior exam are fluid-filled dilated pelvic small bowel loops likely in part due to known right small and large bowel containing lower quadrant hernias. No evidence of incarceration. This is contributing to moderate small bowel dilatation up to 3.5 cm and may reflect a partial SBO or early SBO. 2. Other chronic stable findings as above. Electronically Signed   By: Ashley Royalty M.D.   On: 11/23/2016 02:16    EKG: Independently reviewed. RBBB. Sinus. No ACS  Assessment/Plan Active Problems:   Atrial fibrillation (HCC)   CKD (chronic kidney disease) stage 3, GFR 30-59 ml/min   History of renal transplant   HTN (hypertension)   Crohn's disease (HCC)   GERD (gastroesophageal reflux disease)   Anemia of chronic disease   Partial small bowel obstruction (HCC)  Partial SBO: CT as above. H/o multiple abdominal surgeries, hernias, and Crohn's disease. Continues to vomit despite conservative measures w/ bowel rest and Rx. Previous SBO resolved after 1 day, though this episode appears worse than previous.  - SBO protocol w/ NGtube - Surgical consult on 11/24/16 if not improving.  -  If unable to tolerate PO on 11/24/16 then consider clamp trial w/ administration of renal rejection meds, synthroid, neurontin, and coumadin  Renal cell carcinoma status post nephrectomy and renal transplant: Creatinine 1.96. Appears to be slightly above baseline  of 1.2-1.6 range. - Convert prednisone to IV Solu-Medrol  - Hold Myfortic, Prograf as there are no IV formulations. If patient continues to require NG tube by 11/24/2016, consider to our clamp trial with administration of medications.  Hypothyroidism: - Resume Synthroid when tolerating by mouth  HTN:  - IV metop until taking PO then transition back to po metoprolol - Hydralazine PO  Atrial fibrillation: Currently in sinus and rate controlled. - Beta blocker as above - Hold coumadin - Lovenox - Daily Coags  GERD: - IV pepcid until taking PO then transition to oral PPI  Anemia: Hemoglobin currently stable at baseline at 12.5. Patient is Jehovah's Witness - Continue Epogen injections  Neuropathy: - resume neurontin when taking po or during clamp trial  Gout:  - resume allopurinol when taking po  DVT prophylaxis: Lovenox  Code Status: full  Family Communication: none  Disposition Plan: pending improvement in SBO  Consults called: none  Admission status: inpt    Hagan Maltz J MD [facility]  If 7PM-7AM, please contact night-coverage [email or web address] Password [facility]  11/23/2016, 8:28 AM

## 2016-11-23 NOTE — Plan of Care (Signed)
Transfer from [facility] Ms. Langi is a 76 year old female with pmh of SLE, Crohn's dz, RCC s/p nephrectomy, s/p renal transplant on chronic immunosuppressive therapy, PAF, DVT, on chronic anticoagulation, CKD, SBO, Jehovah's Witness, and OSA; who presents with complaints of RUQ pain x 1 day with N/V.  Vital signs: Temperature 98.25F, pulse 78-85, respirations 14-18, blood pressure elevated to 190/87, oxygen saturation maintained on room air.  Labs: CBC wnl, potassium 5.3, BUN 42, creatinine 1.96. Imaging: CT scan of the abdomen shows small in large bowel containing lower right quadrant hernia  w/o signs of incarceration but small bowel dilatation up to 3.5 cm noted to suggest early or partial SBO  Will likely need a general surgery consult consultation on admission. Patient given Zofran with improvement of vomiting symptoms and multiple rounds of IV morphine. Patient currently is  NPO. Started patient on IV fluids of normal saline at 75. Admitted as an inpatient to a MedSurg bed here at [facility]

## 2016-11-23 NOTE — ED Triage Notes (Signed)
Attempted to call report to receiving nurse. Nurse not available, will return call.

## 2016-11-23 NOTE — Progress Notes (Signed)
[facility] Admissions paged for pt arrival in 6N06 at [phone].

## 2016-11-24 DIAGNOSIS — I4891 Unspecified atrial fibrillation: Secondary | ICD-10-CM

## 2016-11-24 DIAGNOSIS — Z0189 Encounter for other specified special examinations: Secondary | ICD-10-CM

## 2016-11-24 LAB — BASIC METABOLIC PANEL
ANION GAP: 11 (ref 5–15)
BUN: 39 mg/dL — ABNORMAL HIGH (ref 6–20)
CHLORIDE: 107 mmol/L (ref 101–111)
CO2: 22 mmol/L (ref 22–32)
Calcium: 9 mg/dL (ref 8.9–10.3)
Creatinine, Ser: 1.94 mg/dL — ABNORMAL HIGH (ref 0.44–1.00)
GFR calc non Af Amer: 24 mL/min — ABNORMAL LOW (ref >60)
GFR, EST AFRICAN AMERICAN: 28 mL/min — AB (ref >60)
Glucose, Bld: 123 mg/dL — ABNORMAL HIGH (ref 65–99)
Potassium: 4.9 mmol/L (ref 3.5–5.1)
Sodium: 140 mmol/L (ref 135–145)

## 2016-11-24 LAB — CBC
HEMATOCRIT: 42.5 % (ref 36.0–46.0)
HEMOGLOBIN: 12.8 g/dL (ref 12.0–15.0)
MCH: 28.2 pg (ref 26.0–34.0)
MCHC: 30.1 g/dL (ref 30.0–36.0)
MCV: 93.6 fL (ref 78.0–100.0)
Platelets: 184 10*3/uL (ref 150–400)
RBC: 4.54 MIL/uL (ref 3.87–5.11)
RDW: 16.5 % — ABNORMAL HIGH (ref 11.5–15.5)
WBC: 7.7 10*3/uL (ref 4.0–10.5)

## 2016-11-24 LAB — PROTIME-INR
INR: 1.94
PROTHROMBIN TIME: 22.4 s — AB (ref 11.4–15.2)

## 2016-11-24 LAB — APTT: aPTT: 42 seconds — ABNORMAL HIGH (ref 24–36)

## 2016-11-24 MED ORDER — PROMETHAZINE HCL 25 MG/ML IJ SOLN: 6.2500 mg | Freq: Four times a day (QID) | INTRAMUSCULAR | Status: DC | PRN

## 2016-11-24 MED ORDER — TACROLIMUS 1 MG PO CAPS
4.0000 mg | ORAL_CAPSULE | Freq: Every day | ORAL | Status: DC
  Administered 2016-11-24 – 2016-11-25 (×2): 4 mg via ORAL
  Filled 2016-11-24 (×2): qty 4

## 2016-11-24 MED ORDER — WARFARIN SODIUM 6 MG PO TABS
6.0000 mg | ORAL_TABLET | Freq: Once | ORAL | Status: AC
  Administered 2016-11-24: 6 mg via ORAL
  Filled 2016-11-24: qty 1

## 2016-11-24 MED ORDER — PANTOPRAZOLE SODIUM 40 MG PO TBEC: 40.0000 mg | DELAYED_RELEASE_TABLET | Freq: Every day | ORAL | Status: DC

## 2016-11-24 MED ORDER — DIPHENHYDRAMINE HCL 25 MG PO CAPS
25.0000 mg | ORAL_CAPSULE | Freq: Once | ORAL | Status: AC
  Administered 2016-11-24: 25 mg via ORAL
  Filled 2016-11-24: qty 1

## 2016-11-24 MED ORDER — PREDNISONE 5 MG PO TABS: 5.0000 mg | ORAL_TABLET | Freq: Every day | ORAL | Status: DC

## 2016-11-24 MED ORDER — GABAPENTIN 100 MG PO CAPS
200.0000 mg | ORAL_CAPSULE | Freq: Every day | ORAL | Status: DC
  Administered 2016-11-24: 200 mg via ORAL
  Filled 2016-11-24: qty 2

## 2016-11-24 MED ORDER — LEVOTHYROXINE SODIUM 100 MCG PO TABS
100.0000 ug | ORAL_TABLET | Freq: Every day | ORAL | Status: DC
  Administered 2016-11-24 – 2016-11-25 (×2): 100 ug via ORAL
  Filled 2016-11-24 (×3): qty 1

## 2016-11-24 MED ORDER — WARFARIN - PHARMACIST DOSING INPATIENT: Freq: Every day | Status: DC

## 2016-11-24 MED ORDER — POLYETHYLENE GLYCOL 3350 17 G PO PACK
17.0000 g | PACK | Freq: Every day | ORAL | Status: DC
  Administered 2016-11-24: 17 g via ORAL
  Filled 2016-11-24 (×2): qty 1

## 2016-11-24 MED ORDER — METOPROLOL TARTRATE 25 MG PO TABS
25.0000 mg | ORAL_TABLET | Freq: Two times a day (BID) | ORAL | Status: DC
  Administered 2016-11-24 – 2016-11-25 (×3): 25 mg via ORAL
  Filled 2016-11-24 (×3): qty 1

## 2016-11-24 MED ORDER — ALLOPURINOL 100 MG PO TABS
100.0000 mg | ORAL_TABLET | Freq: Two times a day (BID) | ORAL | Status: DC
  Administered 2016-11-24 – 2016-11-25 (×3): 100 mg via ORAL
  Filled 2016-11-24 (×3): qty 1

## 2016-11-24 MED ORDER — MYCOPHENOLATE SODIUM 180 MG PO TBEC
360.0000 mg | DELAYED_RELEASE_TABLET | Freq: Two times a day (BID) | ORAL | Status: DC
  Administered 2016-11-24 – 2016-11-25 (×3): 360 mg via ORAL
  Filled 2016-11-24 (×3): qty 2

## 2016-11-24 MED ORDER — TACROLIMUS 1 MG PO CAPS
3.0000 mg | ORAL_CAPSULE | Freq: Every day | ORAL | Status: DC
  Administered 2016-11-24: 3 mg via ORAL
  Filled 2016-11-24: qty 3

## 2016-11-24 MED ORDER — OMEPRAZOLE 20 MG PO CPDR
20.0000 mg | DELAYED_RELEASE_CAPSULE | Freq: Every day | ORAL | Status: DC
  Administered 2016-11-25: 20 mg via ORAL
  Filled 2016-11-24: qty 1

## 2016-11-24 NOTE — Progress Notes (Signed)
ANTICOAGULATION CONSULT NOTE - Initial Consult  Pharmacy Consult for Coumadin Indication: atrial fibrillation  Allergies  Allergen Reactions  . Amoxicillin Anaphylaxis  . Ampicillin Anaphylaxis  . Ciprofloxacin Swelling and Other (See Comments)    "of my throat"  . Erythromycin Anaphylaxis  . Penicillins Anaphylaxis    Has patient had a PCN reaction causing immediate rash, facial/tongue/throat swelling, SOB or lightheadedness with hypotension: Yes Has patient had a PCN reaction causing severe rash involving mucus membranes or skin necrosis: No Has patient had a PCN reaction that required hospitalization: Yes Has patient had a PCN reaction occurring within the last 10 years: Yes If all of the above answers are "NO", then may proceed with Cephalosporin use.  . Sulfa Antibiotics Itching  . Sulfamethoxazole-Trimethoprim Itching  . Tetracycline Anaphylaxis  . Labetalol Nausea And Vomiting    Patient Measurements: Height: 5' 3"  (160 cm) Weight: 139 lb 8.8 oz (63.3 kg) IBW/kg (Calculated) : 52.4   Vital Signs: Temp: 98.9 F (37.2 C) (07/19 0403) Temp Source: Oral (07/19 0403) BP: 128/68 (07/19 0403) Pulse Rate: 94 (07/19 0403)  Labs:  Recent Labs  11/22/16 2350 11/24/16 0319  HGB 12.0 12.8  HCT 38.6 42.5  PLT 162 184  APTT  --  42*  LABPROT  --  22.4*  INR  --  1.94  CREATININE 1.96* 1.94*    Estimated Creatinine Clearance: 23.2 mL/min (A) (by C-G formula based on SCr of 1.94 mg/dL (H)).   Medical History: Past Medical History:  Diagnosis Date  . Anemia    "when lupus flares up"  . Anxiety   . Arthritis    "in my joints"  . Atrial fibrillation ([place])   . CAP (community acquired pneumonia) 08/20/2014  . Crohn's disease ([place])   . DVT of leg (deep venous thrombosis) ([place]) 2012-8./13/2013   "have had one in each leg now"  . DVT of lower extremity, bilateral ([place])    "have had them in both legs; last one was on the RLE this year" (04/25/2012)  . GERD  (gastroesophageal reflux disease)   . Gout 2016  . Hypertension   . Hypothyroidism   . Lower GI bleed 2012  . Numbness and tingling of foot    bilaterally  . OSA on CPAP   . Refusal of blood transfusion for reasons of conscience 12/20/2011   pt is Jehovah's Witness  . Renal cell carcinoma   . Renal disorder    S/P nephrectomy; dialysis; "working fine now" (12/20/11)  . SBO (small bowel obstruction) ([place]) 11/2016  . SLE (systemic lupus erythematosus) (HCC)     Medications:  Scheduled:  . allopurinol  100 mg Oral BID  . enoxaparin (LOVENOX) injection  30 mg Subcutaneous Q24H  . gabapentin  200 mg Oral QHS  . levothyroxine  100 mcg Oral Daily  . methylPREDNISolone (SOLU-MEDROL) injection  5.2 mg Intravenous Daily  . metoprolol tartrate  25 mg Oral BID  . mycophenolate  360 mg Oral BID  . pantoprazole  40 mg Oral Daily  . polyethylene glycol  17 g Oral Daily  . predniSONE  5 mg Oral Daily  . tacrolimus  3 mg Oral Q1200  . tacrolimus  4 mg Oral Daily    Assessment: 75yof kidney transplant patient on Coumadin for AFib, also with hx of DVTs and has IVC filter.  Pt admitted with abd pain and N/V, with NGT to suction now with (+)flatus & BM & home medications being resumed.  INR 1.94, per d/w pt  last dose of Coumadin was Mon 7/16.  Will bump Coumadin dose slightly to compensate for missed doses. PTA: Coumadin 2.45m daily, except 513mon Tue/Thurs  Spoke with Dr SaVerlon Auegarding d/c Metoprolol IV and Famotidine IV.. Marland KitchenGoal of Therapy:  INR 2-3 Monitor platelets by anticoagulation protocol: Yes   Plan:  D/C Metoprolol IV q6 & Pepcid IV with po meds resumed Coumadin [id number], small bump for missed doses then plan to resume home dose Daily INR Watch for s/s of bleeding  KenLewie ChamberPharmD CliHaz[facility]

## 2016-11-24 NOTE — Discharge Instructions (Addendum)

## 2016-11-24 NOTE — Progress Notes (Signed)
PROGRESS NOTE    [name]  BTD:[id number] DOB: [birth year] DOA: 11/22/2016 PCP: Ann Held, DO  Outpatient Specialists:     Brief Narrative:   67 ? 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 Belenda Cruise [facility] 07/19/2004 Crohn's-foll Dr. 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--closure 1999 C section 1984 LArge R sided ernia Recurrent DVt s/p IVC filter 2012 Afib CHad2Vasc2 score~ 4 on chr coumadin Hypothyroid OSa on CPAP  Admit with SBO NGT placed about 300 Eventually started having flatus and stool on 7/19   Assessment & Plan:   Active Problems:   Atrial fibrillation (HCC)   CKD (chronic kidney disease) stage 3, GFR 30-59 ml/min   History of renal transplant   HTN (hypertension)   Crohn's disease (HCC)   GERD (gastroesophageal reflux disease)   Anemia of chronic disease   Partial small bowel obstruction (HCC)   Small bowel obstruction Large hernia History of Crohn's disease chronically followed via Dr. Laurence Spates  Passing flatus, can have and chips today and resume meds slowly today  At this juncture no need for surgical evaluation for GI eval but will need close follow-up as an outpatient with gastroenterology  Would suggest further follow-up at tertiary care facility [facility] for consideration of further surgery for hernia   Atrial fibrillation [place] score 4 on chronic Coumadin Recurrent DVT + IVC filter placed 2012  Resume Coumadin per pharmacy 7/19  Obtain INR a.m.   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 6 Prior ESRD/prior PD Parathyroidectomy 05/2000  Resume CellCept 4 mg a.m., 3 mg every morning , Myfortic 360 mg twice a day  Discontinue Solu-Medrol, resume prednisone 5 mg  Needs outpatient follow-up with her transplant physician    Anemia of renal disease Jehovah's Witness  As an out agent  will need to continue PTA calcitriol and EPO  OSA on BiPAP chronically  Hypothyroidism  Resume Synthroid 100 Michael daily  Neuropathy  Continue get an 200 at bedtime  Gout continue allopurinol 100 3 times a day     Consultants:   none  Procedures:   none  Antimicrobials:   none    Subjective: Patient passing flatus and small stool Feels as if she is much better Tells me about recent admission in March this year for similar issue Saw her minimally invasive surgeon at the time subsequent to that No pain currently No nausea Hungry  Objective: Vitals:   11/23/16 1813 11/23/16 2034 11/24/16 0014 11/24/16 0403  BP: (!) 159/93 (!) 163/78 124/62 128/68  Pulse:  85 100 94  Resp:  18 16 18   Temp:  98.1 F (36.7 C)  98.9 F (37.2 C)  TempSrc:  Axillary  Oral  SpO2:  94% 100% 97%  Weight:      Height:        Intake/Output Summary (Last 24 hours) at 11/24/16 0708 Last data filed at 11/24/16 0300  Gross per 24 hour  Intake          1053.33 ml  Output             2250 ml  Net         -1196.67 ml   Filed Weights   11/22/16 2314 11/23/16 0638  Weight: 61.2 kg (135 lb) 63.3 kg (139 lb 8.8 oz)    Examination:  Alert pleasant no distress  NG  tube in place Chest clear no added sound Abdomen soft nondistended No lower extremity edema Neuro intact     Data Reviewed: I have personally reviewed   CBC:  Recent Labs Lab 11/17/16 1316 11/22/16 2350 11/24/16 0319  WBC  --  9.1 7.7  NEUTROABS  --  6.4  --   HGB 11.1* 12.0 12.8  HCT  --  38.6 42.5  MCV  --  93.2 93.6  PLT  --  162 147   Basic Metabolic Panel:  Recent Labs Lab 11/22/16 2350 11/24/16 0319  NA 136 140  K 5.3* 4.9  CL 104 107  CO2 22 22  GLUCOSE 130* 123*  BUN 42* 39*  CREATININE 1.96* 1.94*  CALCIUM 9.2 9.0   GFR: Estimated Creatinine Clearance: 23.2 mL/min (A) (by C-G formula based on SCr of 1.94 mg/dL (H)). Liver Function Tests:  Recent Labs Lab 11/22/16 2350  AST 16    ALT 13*  ALKPHOS 182*  BILITOT 0.4  PROT 6.8  ALBUMIN 4.0    Recent Labs Lab 11/22/16 2350  LIPASE 32   No results for input(s): AMMONIA in the last 168 hours. Coagulation Profile:  Recent Labs Lab 11/24/16 0319  INR 1.94   Cardiac Enzymes: No results for input(s): CKTOTAL, CKMB, CKMBINDEX, TROPONINI in the last 168 hours. BNP (last 3 results) No results for input(s): PROBNP in the last 8760 hours. HbA1C: No results for input(s): HGBA1C in the last 72 hours. CBG: No results for input(s): GLUCAP in the last 168 hours. Lipid Profile: No results for input(s): CHOL, HDL, LDLCALC, TRIG, CHOLHDL, LDLDIRECT in the last 72 hours. Thyroid Function Tests: No results for input(s): TSH, T4TOTAL, FREET4, T3FREE, THYROIDAB in the last 72 hours. Anemia Panel: No results for input(s): VITAMINB12, FOLATE, FERRITIN, TIBC, IRON, RETICCTPCT in the last 72 hours. Urine analysis:    Component Value Date/Time   COLORURINE YELLOW 11/23/2016 0046   APPEARANCEUR CLEAR 11/23/2016 0046   LABSPEC 1.014 11/23/2016 0046   PHURINE 6.0 11/23/2016 0046   GLUCOSEU NEGATIVE 11/23/2016 0046   HGBUR NEGATIVE 11/23/2016 0046   BILIRUBINUR NEGATIVE 11/23/2016 0046   BILIRUBINUR neg 09/02/2015 1124   KETONESUR NEGATIVE 11/23/2016 0046   PROTEINUR NEGATIVE 11/23/2016 0046   UROBILINOGEN 0.2 09/02/2015 1124   UROBILINOGEN 0.2 08/23/2014 0156   NITRITE NEGATIVE 11/23/2016 0046   LEUKOCYTESUR MODERATE (A) 11/23/2016 0046   Sepsis Labs: @LABRCNTIP (procalcitonin:4,lacticidven:4)  )No results found for this or any previous visit (from the past 240 hour(s)).       Radiology Studies: Ct Abdomen Pelvis Wo Contrast  Result Date: 11/23/2016 CLINICAL DATA:  Sudden epigastric and right upper quadrant pain x1 day with nausea. History of Crohn's disease and renal cell carcinoma with renal transplant in 2006. Known hernia. EXAM: CT ABDOMEN AND PELVIS WITHOUT CONTRAST TECHNIQUE: Multidetector CT imaging of  the abdomen and pelvis was performed following the standard protocol without IV contrast. COMPARISON:  None. FINDINGS: Lower chest: The included heart is top-normal in size without pericardial effusion. There is three-vessel coronary arteriosclerosis and thoracic aortic atherosclerosis. Scarring and/or atelectasis is seen at each lung base. Calcified pleural plaque is noted at the right lung base. Hepatobiliary: 8 mm subcapsular hypodensity in the right hepatic lobe, stable in appearance consistent with a cyst or hemangioma but too small to further characterize. Status post cholecystectomy. Unchanged biliary dilatation at the porta hepatis. No calcified choledocholithiasis. Pancreas: Atrophy of the pancreas.  No inflammation or focal mass. Spleen: Normal splenic size with scattered calcified granulomas. Linear  subcapsular calcifications are stable. Adrenals/Urinary Tract: Bilateral nephrectomies. No adrenal masses. Transplanted right lower quadrant kidney without obstructive uropathy or focal mass. No nephrolithiasis. Contracted urinary bladder without stones. Stomach/Bowel: The stomach is physiologically distended. There is a small stable hiatal hernia. There is normal small bowel rotation noted. Mild to moderate fluid-filled distention of small bowel loops in part due to lateral small and large bowel containing hernias in the right lower quadrant. No incarceration identified. Colonic diverticulosis without acute diverticulitis. Vascular/Lymphatic: IVC filter in place. Advanced atherosclerosis of the abdominal aorta and branch vessels. No aneurysm. Reproductive: Atrophic uterus.  No adnexal mass. Other: Scarring along the anterior abdominal wall with small fat containing upper abdominal ventral abdominal wall hernia. Injection granulomata overlying the right gluteal region. Soft tissue induration bilaterally overlies the lower gluteal muscles. Musculoskeletal: Discogenic sclerosis at T10-11 with endplate  irregularity, stable in appearance. Stable trabecular thickening of the posterior elements at L1. Chronic degenerative change about both hips with periarticular and intra-articular loose bodies. No acute nor suspicious osseous abnormalities. IMPRESSION: 1. Similar to prior exam are fluid-filled dilated pelvic small bowel loops likely in part due to known right small and large bowel containing lower quadrant hernias. No evidence of incarceration. This is contributing to moderate small bowel dilatation up to 3.5 cm and may reflect a partial SBO or early SBO. 2. Other chronic stable findings as above. Electronically Signed   By: Ashley Royalty M.D.   On: 11/23/2016 02:16   Dg Abd Portable 1v-small Bowel Protocol-position Verification  Result Date: 11/23/2016 CLINICAL DATA:  NG tube placement. EXAM: PORTABLE ABDOMEN - 1 VIEW COMPARISON:  CT 11/23/2016.  KUB 08/07/2016 . FINDINGS: NG tube noted with its tip projected over the distal stomach. No gastric or bowel distention or free air. Surgical clips noted over the abdomen pelves. IVC filter noted in stable position. Calcified pleural plaque on the right noted. IMPRESSION: NG tube noted with tip projected over the distal stomach. No gastric or bowel distention . Electronically Signed   By: Marcello Moores  Register   On: 11/23/2016 09:59        Scheduled Meds: . enoxaparin (LOVENOX) injection  30 mg Subcutaneous Q24H  . methylPREDNISolone (SOLU-MEDROL) injection  5.2 mg Intravenous Daily  . metoprolol tartrate  5 mg Intravenous Q6H   Continuous Infusions: . sodium chloride 75 mL/hr at 11/24/16 0246  . famotidine (PEPCID) IV Stopped (11/23/16 2300)     LOS: 1 day    Time spent: [place], MD [facility]   If 7PM-7AM, please contact night-coverage [email or web address] Password [email or web address] 11/24/2016, 7:08 AM

## 2016-11-25 DIAGNOSIS — N289 Disorder of kidney and ureter, unspecified: Secondary | ICD-10-CM

## 2016-11-25 LAB — PROTIME-INR
INR: 2.37
PROTHROMBIN TIME: 26.4 s — AB (ref 11.4–15.2)

## 2016-11-25 MED ORDER — WARFARIN SODIUM 5 MG PO TABS: 5.0000 mg | ORAL_TABLET | ORAL | Status: DC

## 2016-11-25 MED ORDER — WARFARIN SODIUM 5 MG PO TABS: 2.5000 mg | ORAL_TABLET | ORAL | Status: DC

## 2016-11-25 NOTE — Progress Notes (Signed)
PT Cancellation Note  Patient Details Name: [name] MRN: [id number] DOB: [birth year]   Cancelled Treatment:    Reason Eval/Treat Not Completed: Other (comment) (pt just finished walking with mobility tech and requested rest)   Roch Quach B Jakyri Brunkhorst 11/25/2016, 8:24 AM  Elwyn Reach, [place]

## 2016-11-25 NOTE — Progress Notes (Signed)
ANTICOAGULATION CONSULT NOTE - Initial Consult  Pharmacy Consult for Coumadin Indication: atrial fibrillation  Allergies  Allergen Reactions  . Amoxicillin Anaphylaxis  . Ampicillin Anaphylaxis  . Ciprofloxacin Swelling and Other (See Comments)    "of my throat"  . Erythromycin Anaphylaxis  . Penicillins Anaphylaxis    Has patient had a PCN reaction causing immediate rash, facial/tongue/throat swelling, SOB or lightheadedness with hypotension: Yes Has patient had a PCN reaction causing severe rash involving mucus membranes or skin necrosis: No Has patient had a PCN reaction that required hospitalization: Yes Has patient had a PCN reaction occurring within the last 10 years: Yes If all of the above answers are "NO", then may proceed with Cephalosporin use.  . Sulfa Antibiotics Itching  . Sulfamethoxazole-Trimethoprim Itching  . Tetracycline Anaphylaxis  . Labetalol Nausea And Vomiting    Patient Measurements: Height: 5' 3"  (160 cm) Weight: 139 lb 8.8 oz (63.3 kg) IBW/kg (Calculated) : 52.4   Vital Signs: Temp: 98.6 F (37 C) (07/20 0519) Temp Source: Oral (07/20 0519) BP: 147/65 (07/20 0519) Pulse Rate: 85 (07/20 0519)  Labs:  Recent Labs  11/22/16 2350 11/24/16 0319 11/25/16 0336  HGB 12.0 12.8  --   HCT 38.6 42.5  --   PLT 162 184  --   APTT  --  42*  --   LABPROT  --  22.4* 26.4*  INR  --  1.94 2.37  CREATININE 1.96* 1.94*  --     Estimated Creatinine Clearance: 23.2 mL/min (A) (by C-G formula based on SCr of 1.94 mg/dL (H)).   Medical History: Past Medical History:  Diagnosis Date  . Anemia    "when lupus flares up"  . Anxiety   . Arthritis    "in my joints"  . Atrial fibrillation ([place])   . CAP (community acquired pneumonia) 08/20/2014  . Crohn's disease ([place])   . DVT of leg (deep venous thrombosis) ([place]) 2012-8./13/2013   "have had one in each leg now"  . DVT of lower extremity, bilateral ([place])    "have had them in both legs; last one was on  the RLE this year" (04/25/2012)  . GERD (gastroesophageal reflux disease)   . Gout 2016  . Hypertension   . Hypothyroidism   . Lower GI bleed 2012  . Numbness and tingling of foot    bilaterally  . OSA on CPAP   . Refusal of blood transfusion for reasons of conscience 12/20/2011   pt is Jehovah's Witness  . Renal cell carcinoma   . Renal disorder    S/P nephrectomy; dialysis; "working fine now" (12/20/11)  . SBO (small bowel obstruction) ([place]) 11/2016  . SLE (systemic lupus erythematosus) (HCC)     Medications:  Scheduled:  . allopurinol  100 mg Oral BID  . enoxaparin (LOVENOX) injection  30 mg Subcutaneous Q24H  . gabapentin  200 mg Oral QHS  . levothyroxine  100 mcg Oral QAC breakfast  . methylPREDNISolone (SOLU-MEDROL) injection  5.2 mg Intravenous Daily  . metoprolol tartrate  25 mg Oral BID  . mycophenolate  360 mg Oral BID  . omeprazole  20 mg Oral Daily  . polyethylene glycol  17 g Oral Daily  . tacrolimus  3 mg Oral Q1200  . tacrolimus  4 mg Oral Daily  . Warfarin - Pharmacist Dosing Inpatient   Does not apply q1800    Assessment: 75yof kidney transplant patient on Coumadin for AFib, also with hx of DVTs and has IVC filter.  Now on  regular diet, taking po medications.  INR within goal.  No bleeding noted  Goal of Therapy:  INR 2-3 Monitor platelets by anticoagulation protocol: Yes   Plan:  Coumadin 2.11m daily, except 566mon Tues/Thurs D/C Lovenox with INR > 2 Continue daily INR for now Watch for s/s of bleeding  KeLewie Chamber PharmD [facility]

## 2016-11-25 NOTE — Discharge Summary (Signed)
Physician Discharge Summary  [name] OJJ:[id number] DOB: 1940-09-23 DOA: 11/22/2016  PCP: Ann Held, DO  Admit date: 11/22/2016 Discharge date: 11/25/2016  Time spent: 25 minutes  Recommendations for Outpatient Follow-up:  1. Needs INR 1 week at PCP office 2. Will cc Dr. Laurence Spates regarding patient's chronic Crohn's disease 3. Will cc [facility] minimally invasive surgeon Dr. Elaina Hoops for care coordination purposes 4. Will need baseline hemoglobin, iron level, saturation ratios and basic metabolic panel in about 1-2 weeks 5. No changes to meds this admission  Discharge Diagnoses:  Active Problems:   Atrial fibrillation (HCC)   CKD (chronic kidney disease) stage 3, GFR 30-59 ml/min   History of renal transplant   HTN (hypertension)   Crohn's disease (HCC)   GERD (gastroesophageal reflux disease)   Anemia of chronic disease   Partial small bowel obstruction [facility])   Discharge Condition: Improved  Diet recommendation: Renal diabetic  Filed Weights   11/22/16 2314 11/23/16 0638  Weight: 61.2 kg (135 lb) 63.3 kg (139 lb 8.8 oz)    History of present illness:  76 ? 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 Belenda Cruise [facility] 07/19/2004 Crohn's-foll Dr. 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 Laurence Spates             Passing flatus, can have and chips today and resume meds slowly today--on discharge was graduated to a full  diet             At  this juncture no need for surgical evaluation or GI eval but will need close follow-up as an outpatient with  gastroenterology             Would suggest further follow-up at tertiary care facility [facility] for consideration of further surgery for hernia              Atrial fibrillation Mali score 4 on chronic Coumadin Recurrent DVT + IVC filter placed 2012             Resume Coumadin per pharmacy 7/19              INR 2.3 on discharge  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 6 Prior ESRD/prior PD Parathyroidectomy 05/2000             Resume CellCept 4 mg a.m., 3 mg every morning , Myfortic 360 mg twice a day             Discontinue Solu-Medrol, resume prednisone 5 mg and will go home on usual dose             Needs outpatient follow-up with her transplant physician               Anemia of renal disease Jehovah's Witness             As an out agent will need to continue PTA calcitriol  and EPO  OSA on BiPAP chronically  Hypothyroidism             Resume Synthroid 100 Michael daily  Neuropathy             Continue gabapentin 200 at bedtime  Gout continue allopurinol 100 3 times a day   Discharge Exam: Vitals:   11/24/16 2145 11/25/16 0519  BP: 135/62 (!) 147/65  Pulse: 73 85  Resp: 17 18  Temp: (!) 97.5 F (36.4 C) 98.6 F (37 C)    General: Alert oriented Cardiovascular: S1-S2 no murmur rub or gallop Respiratory: Clinically clear no added sound Abdomen soft nontender no rebound no guarding Please contact euthymic and congruent  Discharge Instructions   Discharge Instructions    Diet - low sodium heart healthy    Complete by:  As directed    Discharge instructions    Complete by:  As directed    Kindly follow-up with Dr. Laurence Spates regarding your chronic Crohn's disease I would strongly recommend following up with [facility] as you have most of your surgeons over there-you may need to hernia surgery at their facility if  this issue recurs again Please get lab work as per your regular physicians protocol-we have made no changes to your medication You will need however an INR level in about 4 days as an outpatient   Increase activity slowly    Complete by:  As directed      Current Discharge Medication List    CONTINUE these medications which have NOT CHANGED   Details  acetaminophen (TYLENOL) 325 MG tablet Take 650 mg by mouth every 6 (six) hours as needed for mild pain.    allopurinol (ZYLOPRIM) 100 MG tablet Take 1 tablet (100 mg total) by mouth 2 (two) times daily. Qty: 30 tablet, Refills: 2   Associated Diagnoses: Gout of right foot, unspecified cause, unspecified chronicity    calcitRIOL (ROCALTROL) 0.25 MCG capsule Take 0.25 mcg by mouth daily.     feeding supplement, ENSURE ENLIVE, (ENSURE ENLIVE) LIQD Take 237 mLs by mouth 2 (two) times daily between meals. Qty: 60 Bottle, Refills: 12    fluticasone (FLONASE) 50 MCG/ACT nasal spray Place 2 sprays into both nostrils daily. Qty: 16 g, Refills: 1    gabapentin (NEURONTIN) 100 MG capsule Take 2 capsules (200 mg total) by mouth at bedtime.   Associated Diagnoses: Neuropathy    levothyroxine (SYNTHROID, LEVOTHROID) 100 MCG tablet Take 1 tablet (100 mcg total) by mouth daily.   Associated Diagnoses: Hypothyroidism, unspecified type    MAGNESIUM-OXIDE 400 (241.3 MG) MG tablet Take 400 mg by mouth 2 (two) times daily. Refills: 0    metoprolol tartrate (LOPRESSOR) 25 MG tablet Take 1 tablet (25 mg total) by mouth 2 (two) times daily.   Associated Diagnoses: Essential hypertension    mycophenolate (MYFORTIC) 180 MG EC tablet Take 360 mg by mouth 2 (two) times daily.      NON FORMULARY 1 each by Other route See admin instructions. CPAP nightly    omeprazole (PRILOSEC) 20 MG capsule Take 20 mg by mouth daily.    polyethylene glycol (MIRALAX / GLYCOLAX) packet Take 17 g by mouth daily.     predniSONE (DELTASONE) 5 MG tablet Take 5 mg by mouth  daily.     tacrolimus (PROGRAF) 1 MG capsule Take 3-4 mg by mouth See admin instructions. Take 4 mg by mouth in the morning and take 3 mg by mouth at noon  warfarin (COUMADIN) 2.5 MG tablet Take 2.5-5 mg by mouth See admin instructions. Take 60m by mouth on Tuesday and Thursday. Take 2.5 mg by mouth on all other days    epoetin alfa (EPOGEN,PROCRIT) 483419UNIT/ML injection Inject 40,000 Units into the skin every 21 ( twenty-one) days.     !! NONFORMULARY OR COMPOUNDED ITEM Lipid , TSH----  DX hypothyroid, chronic kidney disease, stage 4 Qty: 1 each, Refills: 0   Associated Diagnoses: Chronic kidney disease (CKD), stage IV (severe) (HWinder; Hypothyroidism, unspecified hypothyroidism type    !! NONFORMULARY OR COMPOUNDED ITEM Compression hose-- thigh high  20-30 mm hg  #1 as directed Dx-- varicose veins b/l with pain Qty: 1 each, Refills: 0   Associated Diagnoses: Varicose veins of bilateral lower extremities with other complications     !! - Potential duplicate medications found. Please discuss with provider.     Allergies  Allergen Reactions  . Amoxicillin Anaphylaxis  . Ampicillin Anaphylaxis  . Ciprofloxacin Swelling and Other (See Comments)    "of my throat"  . Erythromycin Anaphylaxis  . Penicillins Anaphylaxis    Has patient had a PCN reaction causing immediate rash, facial/tongue/throat swelling, SOB or lightheadedness with hypotension: Yes Has patient had a PCN reaction causing severe rash involving mucus membranes or skin necrosis: No Has patient had a PCN reaction that required hospitalization: Yes Has patient had a PCN reaction occurring within the last 10 years: Yes If all of the above answers are "NO", then may proceed with Cephalosporin use.  . Sulfa Antibiotics Itching  . Sulfamethoxazole-Trimethoprim Itching  . Tetracycline Anaphylaxis  . Labetalol Nausea And Vomiting      The results of significant diagnostics from this hospitalization (including imaging,  microbiology, ancillary and laboratory) are listed below for reference.    Significant Diagnostic Studies: Ct Abdomen Pelvis Wo Contrast  Result Date: 11/23/2016 CLINICAL DATA:  Sudden epigastric and right upper quadrant pain x1 day with nausea. History of Crohn's disease and renal cell carcinoma with renal transplant in 2006. Known hernia. EXAM: CT ABDOMEN AND PELVIS WITHOUT CONTRAST TECHNIQUE: Multidetector CT imaging of the abdomen and pelvis was performed following the standard protocol without IV contrast. COMPARISON:  None. FINDINGS: Lower chest: The included heart is top-normal in size without pericardial effusion. There is three-vessel coronary arteriosclerosis and thoracic aortic atherosclerosis. Scarring and/or atelectasis is seen at each lung base. Calcified pleural plaque is noted at the right lung base. Hepatobiliary: 8 mm subcapsular hypodensity in the right hepatic lobe, stable in appearance consistent with a cyst or hemangioma but too small to further characterize. Status post cholecystectomy. Unchanged biliary dilatation at the porta hepatis. No calcified choledocholithiasis. Pancreas: Atrophy of the pancreas.  No inflammation or focal mass. Spleen: Normal splenic size with scattered calcified granulomas. Linear subcapsular calcifications are stable. Adrenals/Urinary Tract: Bilateral nephrectomies. No adrenal masses. Transplanted right lower quadrant kidney without obstructive uropathy or focal mass. No nephrolithiasis. Contracted urinary bladder without stones. Stomach/Bowel: The stomach is physiologically distended. There is a small stable hiatal hernia. There is normal small bowel rotation noted. Mild to moderate fluid-filled distention of small bowel loops in part due to lateral small and large bowel containing hernias in the right lower quadrant. No incarceration identified. Colonic diverticulosis without acute diverticulitis. Vascular/Lymphatic: IVC filter in place. Advanced  atherosclerosis of the abdominal aorta and branch vessels. No aneurysm. Reproductive: Atrophic uterus.  No adnexal mass. Other: Scarring along the anterior abdominal wall with small fat containing upper abdominal ventral abdominal wall hernia. Injection granulomata  overlying the right gluteal region. Soft tissue induration bilaterally overlies the lower gluteal muscles. Musculoskeletal: Discogenic sclerosis at T10-11 with endplate irregularity, stable in appearance. Stable trabecular thickening of the posterior elements at L1. Chronic degenerative change about both hips with periarticular and intra-articular loose bodies. No acute nor suspicious osseous abnormalities. IMPRESSION: 1. Similar to prior exam are fluid-filled dilated pelvic small bowel loops likely in part due to known right small and large bowel containing lower quadrant hernias. No evidence of incarceration. This is contributing to moderate small bowel dilatation up to 3.5 cm and may reflect a partial SBO or early SBO. 2. Other chronic stable findings as above. Electronically Signed   By: Ashley Royalty M.D.   On: 11/23/2016 02:16   Dg Abd Portable 1v-small Bowel Obstruction Protocol-initial, 8 Hr Delay  Result Date: 11/24/2016 CLINICAL DATA:  Small-bowel obstruction. EXAM: PORTABLE ABDOMEN - 1 VIEW COMPARISON:  11/23/2016.  CT 08/06/2016 . FINDINGS: NG tube noted projected over the distal stomach. No bowel distention. Surgical clips noted over the abdomen and pelvis. IVC filter noted. Aortic atherosclerotic vascular calcification. Small sclerotic density right ilium, most likely bone island again noted. Degenerative changes lumbar spine and both hips. IMPRESSION: NG tube noted with its tip projected over the distal stomach. No bowel distention. Exam appears stable from prior exam. Electronically Signed   By: Marcello Moores  Register   On: 11/24/2016 07:20   Dg Abd Portable 1v-small Bowel Protocol-position Verification  Result Date: 11/23/2016 CLINICAL  DATA:  NG tube placement. EXAM: PORTABLE ABDOMEN - 1 VIEW COMPARISON:  CT 11/23/2016.  KUB 08/07/2016 . FINDINGS: NG tube noted with its tip projected over the distal stomach. No gastric or bowel distention or free air. Surgical clips noted over the abdomen pelves. IVC filter noted in stable position. Calcified pleural plaque on the right noted. IMPRESSION: NG tube noted with tip projected over the distal stomach. No gastric or bowel distention . Electronically Signed   By: Marcello Moores  Register   On: 11/23/2016 09:59    Microbiology: No results found for this or any previous visit (from the past 240 hour(s)).   Labs: Basic Metabolic Panel:  Recent Labs Lab 11/22/16 2350 11/24/16 0319  NA 136 140  K 5.3* 4.9  CL 104 107  CO2 22 22  GLUCOSE 130* 123*  BUN 42* 39*  CREATININE 1.96* 1.94*  CALCIUM 9.2 9.0   Liver Function Tests:  Recent Labs Lab 11/22/16 2350  AST 16  ALT 13*  ALKPHOS 182*  BILITOT 0.4  PROT 6.8  ALBUMIN 4.0    Recent Labs Lab 11/22/16 2350  LIPASE 32   No results for input(s): AMMONIA in the last 168 hours. CBC:  Recent Labs Lab 11/22/16 2350 11/24/16 0319  WBC 9.1 7.7  NEUTROABS 6.4  --   HGB 12.0 12.8  HCT 38.6 42.5  MCV 93.2 93.6  PLT 162 184   Cardiac Enzymes: No results for input(s): CKTOTAL, CKMB, CKMBINDEX, TROPONINI in the last 168 hours. BNP: BNP (last 3 results) No results for input(s): BNP in the last 8760 hours.  ProBNP (last 3 results) No results for input(s): PROBNP in the last 8760 hours.  CBG: No results for input(s): GLUCAP in the last 168 hours.     SignedNita Sells MD   [facility] 11/25/2016, 10:08 AM

## 2016-11-25 NOTE — Evaluation (Signed)
Physical Therapy Evaluation-Discharge  Patient Details Name: [name] MRN: [id number] DOB: 1940/07/13 Today's Date: 11/25/2016   History of Present Illness  76 yo admited with SBO. PMHx: ESRD, Lupus, Crohns, Afib, HTN, neuropathy, gout  Clinical Impression  Pt pleasant and eager to mobilize with PT. Pt requires no physical assistance for mobility. Presents with mild instability with challenging of balance with head turns during gait; however pt is aware of safety and has no LOB during ambulation. Pt seems safe to return home. Pt at baseline function and does not require further PT services. Pt aware and agreeable; will sign off. Recommend continued ambulation with nursing.     Follow Up Recommendations No PT follow up    Equipment Recommendations  None recommended by PT    Recommendations for Other Services       Precautions / Restrictions Precautions Precautions: None Restrictions Weight Bearing Restrictions: No      Mobility  Bed Mobility               General bed mobility comments: in chair on arrival  Transfers Overall transfer level: Modified independent                  Ambulation/Gait Ambulation/Gait assistance: Modified independent (Device/Increase time) Ambulation Distance (Feet): 500 Feet Assistive device: Straight cane Gait Pattern/deviations: Step-through pattern;Decreased stride length   Gait velocity interpretation: Below normal speed for age/gender General Gait Details: Gait with cane and some instances of minor staggering of gait; mild instability with alternating head turns   Stairs Stairs: Yes Stairs assistance: Modified independent (Device/Increase time) Stair Management: One rail Right;With cane;Forwards;Step to pattern Number of Stairs: 7    Wheelchair Mobility    Modified Rankin (Stroke Patients Only)       Balance Overall balance assessment: Needs assistance   Sitting balance-Leahy Scale: Good     Standing  balance support: Single extremity supported Standing balance-Leahy Scale: Fair Standing balance comment: Use of cane for stability during ambulation with slight staggering at times, esp with alternating head turns. However, no LOB. Pt aware of safety and use of cane for stability.                              Pertinent Vitals/Pain Pain Assessment: No/denies pain    Home Living Family/patient expects to be discharged to:: Private residence Living Arrangements: Spouse/significant other Available Help at Discharge: Family Type of Home: House Home Access: Stairs to enter   Technical brewer of Steps: 1 Home Layout: [place] - single point;Shower seat      Prior Function Level of Independence: Independent with assistive device(s)         Comments: cane periodically     Hand Dominance   Dominant Hand: Right    Extremity/Trunk Assessment   Upper Extremity Assessment Upper Extremity Assessment: Overall WFL for tasks assessed    Lower Extremity Assessment Lower Extremity Assessment: Overall WFL for tasks assessed    Cervical / Trunk Assessment Cervical / Trunk Assessment: Normal  Communication   Communication: No difficulties  Cognition Arousal/Alertness: Awake/alert Behavior During Therapy: WFL for tasks assessed/performed Overall Cognitive Status: Within Functional Limits for tasks assessed                                        General Comments  Exercises     Assessment/Plan    PT Assessment Patent does not need any further PT services  PT Problem List         PT Treatment Interventions      PT Goals (Current goals can be found in the Care Plan section)  Acute Rehab PT Goals PT Goal Formulation: All assessment and education complete, DC therapy    Frequency     Barriers to discharge        Co-evaluation               AM-PAC PT "6 Clicks" Daily Activity  Outcome Measure  Difficulty turning over in bed (including adjusting bedclothes, sheets and blankets)?: None Difficulty moving from lying on back to sitting on the side of the bed? : None Difficulty sitting down on and standing up from a chair with arms (e.g., wheelchair, bedside commode, etc,.)?: None Help needed moving to and from a bed to chair (including a wheelchair)?: None Help needed walking in hospital room?: None Help needed climbing 3-5 steps with a railing? : None 6 Click Score: 24    End of Session Equipment Utilized During Treatment: Gait belt Activity Tolerance: Patient tolerated treatment well Patient left: in chair;with call bell/phone within reach Nurse Communication: Mobility status PT Visit Diagnosis: Difficulty in walking, not elsewhere classified (R26.2)    Time: 1950-9326 PT Time Calculation (min) (ACUTE ONLY): 22 min   Charges:   PT Evaluation $PT Eval Low Complexity: 1 Procedure     PT G CodesElberta Mcfarland, SPT Acute Rehab [name] 11/25/2016, 10:48 AM

## 2016-11-25 NOTE — Progress Notes (Signed)
Patient was discharged to home. Reviewed AVS with patient and was given a copy of it . Patient agreed and verbalized understanding with no further questions. Patient left unit via wheelchair accompanied by her spouse.  Vergia Alberts n 11/25/16

## 2016-11-29 ENCOUNTER — Telehealth: Payer: Self-pay | Admitting: Behavioral Health

## 2016-11-29 DIAGNOSIS — N2589 Other disorders resulting from impaired renal tubular function: Secondary | ICD-10-CM | POA: Diagnosis not present

## 2016-11-29 DIAGNOSIS — Z940 Kidney transplant status: Secondary | ICD-10-CM | POA: Diagnosis not present

## 2016-11-29 DIAGNOSIS — I4891 Unspecified atrial fibrillation: Secondary | ICD-10-CM | POA: Diagnosis not present

## 2016-11-29 DIAGNOSIS — N184 Chronic kidney disease, stage 4 (severe): Secondary | ICD-10-CM | POA: Diagnosis not present

## 2016-11-29 DIAGNOSIS — N2581 Secondary hyperparathyroidism of renal origin: Secondary | ICD-10-CM | POA: Diagnosis not present

## 2016-11-29 DIAGNOSIS — M329 Systemic lupus erythematosus, unspecified: Secondary | ICD-10-CM | POA: Diagnosis not present

## 2016-11-29 DIAGNOSIS — M109 Gout, unspecified: Secondary | ICD-10-CM | POA: Diagnosis not present

## 2016-11-29 DIAGNOSIS — Z86718 Personal history of other venous thrombosis and embolism: Secondary | ICD-10-CM | POA: Diagnosis not present

## 2016-11-29 DIAGNOSIS — D631 Anemia in chronic kidney disease: Secondary | ICD-10-CM | POA: Diagnosis not present

## 2016-11-29 DIAGNOSIS — Z7901 Long term (current) use of anticoagulants: Secondary | ICD-10-CM | POA: Diagnosis not present

## 2016-11-29 DIAGNOSIS — I129 Hypertensive chronic kidney disease with stage 1 through stage 4 chronic kidney disease, or unspecified chronic kidney disease: Secondary | ICD-10-CM | POA: Diagnosis not present

## 2016-11-29 NOTE — Telephone Encounter (Signed)
Patient declines TCM/Hospital Follow-up at this time. She voiced that she's been referred by her nephrologist to follow-up with [facility], however she will get in contact with her PCP soon to provide updates.

## 2016-11-29 NOTE — Telephone Encounter (Signed)
Noted and agree. 

## 2016-12-05 ENCOUNTER — Ambulatory Visit (INDEPENDENT_AMBULATORY_CARE_PROVIDER_SITE_OTHER): Payer: Medicare Other | Admitting: Family Medicine

## 2016-12-05 ENCOUNTER — Encounter: Payer: Self-pay | Admitting: Family Medicine

## 2016-12-05 VITALS — BP 124/70 | HR 65 | Temp 97.9°F | Ht 63.0 in | Wt 140.2 lb

## 2016-12-05 DIAGNOSIS — N6489 Other specified disorders of breast: Secondary | ICD-10-CM | POA: Diagnosis not present

## 2016-12-05 DIAGNOSIS — N183 Chronic kidney disease, stage 3 unspecified: Secondary | ICD-10-CM

## 2016-12-05 DIAGNOSIS — R221 Localized swelling, mass and lump, neck: Secondary | ICD-10-CM | POA: Diagnosis not present

## 2016-12-05 DIAGNOSIS — I10 Essential (primary) hypertension: Secondary | ICD-10-CM | POA: Diagnosis not present

## 2016-12-05 DIAGNOSIS — R2231 Localized swelling, mass and lump, right upper limb: Secondary | ICD-10-CM | POA: Diagnosis not present

## 2016-12-05 DIAGNOSIS — E039 Hypothyroidism, unspecified: Secondary | ICD-10-CM

## 2016-12-05 MED ORDER — NONFORMULARY OR COMPOUNDED ITEM: 0 refills | Status: DC

## 2016-12-05 NOTE — Progress Notes (Signed)
Pre visit review using our clinic review tool, if applicable. No additional management support is needed unless otherwise documented below in the visit note. 

## 2016-12-05 NOTE — Progress Notes (Signed)
Patient ID: [name], female    DOB: 01/19/1941  Age: 76 y.o. MRN: [id number]    Subjective:  Subjective  HPI [name] presents for swelling under R arm that gets larger in the evening--- slightly tender No d/c  Review of Systems  Constitutional: Negative for activity change, appetite change, fatigue and unexpected weight change.  Respiratory: Negative for cough and shortness of breath.   Cardiovascular: Negative for chest pain and palpitations.  Psychiatric/Behavioral: Negative for behavioral problems and dysphoric mood. The patient is not nervous/anxious.     History Past Medical History:  Diagnosis Date  . Anemia    "when lupus flares up"  . Anxiety   . Arthritis    "in my joints"  . Atrial fibrillation ([place])   . CAP (community acquired pneumonia) 08/20/2014  . Crohn's disease ([place])   . DVT of leg (deep venous thrombosis) ([place]) 2012-8./13/2013   "have had one in each leg now"  . DVT of lower extremity, bilateral ([place])    "have had them in both legs; last one was on the RLE this year" (04/25/2012)  . GERD (gastroesophageal reflux disease)   . Gout 2016  . Hypertension   . Hypothyroidism   . Lower GI bleed 2012  . Numbness and tingling of foot    bilaterally  . OSA on CPAP   . Refusal of blood transfusion for reasons of conscience 12/20/2011   pt is Jehovah's Witness  . Renal cell carcinoma   . Renal disorder    S/P nephrectomy; dialysis; "working fine now" (12/20/11)  . SBO (small bowel obstruction) ([place]) 11/2016  . SLE (systemic lupus erythematosus) ([place])     She has a past surgical history that includes Cesarean section (1984); Nephrectomy transplanted organ (2006); Colectomy (1998); Excisional hemorrhoidectomy; Cataract extraction w/ intraocular lens  implant, bilateral (2005-2010); Vena cava filter placement (2012); Insertion of dialysis catheter ([id number]); Cholecystectomy (1995); Colostomy (1998); Colostomy reversal (1999); Bone marrow biopsy (1993;  1995); Abdominal exploration surgery (1995); Parathyroid implant removal (Left); and Colonoscopy (N/A, 08/30/2013).   Her family history includes Heart disease in her mother; Hypertension in her unknown relative; Sleep apnea in her sister.She reports that she has never smoked. She has never used smokeless tobacco. She reports that she does not drink alcohol or use drugs.  Current Outpatient Prescriptions on File Prior to Visit  Medication Sig Dispense Refill  . acetaminophen (TYLENOL) 325 MG tablet Take 650 mg by mouth every 6 (six) hours as needed for mild pain.    Marland Kitchen allopurinol (ZYLOPRIM) 100 MG tablet Take 1 tablet (100 mg total) by mouth 2 (two) times daily. 30 tablet 2  . calcitRIOL (ROCALTROL) 0.25 MCG capsule Take 0.25 mcg by mouth daily.     Marland Kitchen epoetin alfa (EPOGEN,PROCRIT) 68341 UNIT/ML injection Inject 40,000 Units into the skin every 21 ( twenty-one) days.     . feeding supplement, ENSURE ENLIVE, (ENSURE ENLIVE) LIQD Take 237 mLs by mouth 2 (two) times daily between meals. 60 Bottle 12  . fluticasone (FLONASE) 50 MCG/ACT nasal spray Place 2 sprays into both nostrils daily. (Patient taking differently: Place 2 sprays into both nostrils daily as needed for allergies. ) 16 g 1  . gabapentin (NEURONTIN) 100 MG capsule Take 2 capsules (200 mg total) by mouth at bedtime.    Marland Kitchen levothyroxine (SYNTHROID, LEVOTHROID) 100 MCG tablet Take 1 tablet (100 mcg total) by mouth daily.    Marland Kitchen MAGNESIUM-OXIDE 400 (241.3 MG) MG tablet Take 400 mg by mouth  2 (two) times daily.  0  . metoprolol tartrate (LOPRESSOR) 25 MG tablet Take 1 tablet (25 mg total) by mouth 2 (two) times daily.    . mycophenolate (MYFORTIC) 180 MG EC tablet Take 360 mg by mouth 2 (two) times daily.      . NON FORMULARY 1 each by Other route See admin instructions. CPAP nightly    . NONFORMULARY OR COMPOUNDED ITEM Lipid , TSH----  DX hypothyroid, chronic kidney disease, stage 4 1 each 0  . NONFORMULARY OR COMPOUNDED ITEM Compression hose--  thigh high  20-30 mm hg  #1 as directed Dx-- varicose veins b/l with pain 1 each 0  . omeprazole (PRILOSEC) 20 MG capsule Take 20 mg by mouth daily.    . polyethylene glycol (MIRALAX / GLYCOLAX) packet Take 17 g by mouth daily.     . predniSONE (DELTASONE) 5 MG tablet Take 5 mg by mouth daily.     . tacrolimus (PROGRAF) 1 MG capsule Take 3-4 mg by mouth See admin instructions. Take 4 mg by mouth in the morning and take 3 mg by mouth at noon    . warfarin (COUMADIN) 2.5 MG tablet Take 2.5-5 mg by mouth See admin instructions. Take 96m by mouth on Tuesday and Thursday. Take 2.5 mg by mouth on all other days     No current facility-administered medications on file prior to visit.      Objective:  Objective  Physical Exam  Constitutional: She is oriented to person, place, and time. She appears well-developed and well-nourished.  HENT:  Head: Normocephalic and atraumatic.  Eyes: Conjunctivae and EOM are normal.  Neck: Normal range of motion. Neck supple. No JVD present. Carotid bruit is not present. No thyromegaly present.  Cardiovascular: Normal rate, regular rhythm and normal heart sounds.   No murmur heard. Pulmonary/Chest: Effort normal and breath sounds normal. No respiratory distress. She has no wheezes. She has no rales. She exhibits no mass and no tenderness. Right breast exhibits mass and tenderness. Right breast exhibits no inverted nipple, no nipple discharge and no skin change. Left breast exhibits no inverted nipple, no mass, no nipple discharge, no skin change and no tenderness.    Musculoskeletal: She exhibits no edema.  Neurological: She is alert and oriented to person, place, and time.  Psychiatric: She has a normal mood and affect.  Nursing note and vitals reviewed.  BP 124/70 (BP Location: Left Arm, Patient Position: Sitting, Cuff Size: Normal)   Pulse 65   Temp 97.9 F (36.6 C) (Oral)   Ht 5' 3"  (1.6 m)   Wt 140 lb 4 oz (63.6 kg)   LMP 03/05/2014   SpO2 99%   BMI  24.84 kg/m  Wt Readings from Last 3 Encounters:  12/05/16 140 lb 4 oz (63.6 kg)  11/23/16 139 lb 8.8 oz (63.3 kg)  09/09/16 136 lb 1.9 oz (61.7 kg)     Lab Results  Component Value Date   WBC 7.7 11/24/2016   HGB 12.8 11/24/2016   HCT 42.5 11/24/2016   PLT 184 11/24/2016   GLUCOSE 123 (H) 11/24/2016   CHOL 165 07/18/2016   TRIG 151.0 (H) 07/18/2016   HDL 47.50 07/18/2016   LDLCALC 87 07/18/2016   ALT 13 (L) 11/22/2016   AST 16 11/22/2016   NA 140 11/24/2016   K 4.9 11/24/2016   CL 107 11/24/2016   CREATININE 1.94 (H) 11/24/2016   BUN 39 (H) 11/24/2016   CO2 22 11/24/2016   TSH 0.35 07/18/2016  INR 2.37 11/25/2016   HGBA1C 6.5 (H) 11/12/2014    Ct Abdomen Pelvis Wo Contrast  Result Date: 11/23/2016 CLINICAL DATA:  Sudden epigastric and right upper quadrant pain x1 day with nausea. History of Crohn's disease and renal cell carcinoma with renal transplant in 2006. Known hernia. EXAM: CT ABDOMEN AND PELVIS WITHOUT CONTRAST TECHNIQUE: Multidetector CT imaging of the abdomen and pelvis was performed following the standard protocol without IV contrast. COMPARISON:  None. FINDINGS: Lower chest: The included heart is top-normal in size without pericardial effusion. There is three-vessel coronary arteriosclerosis and thoracic aortic atherosclerosis. Scarring and/or atelectasis is seen at each lung base. Calcified pleural plaque is noted at the right lung base. Hepatobiliary: 8 mm subcapsular hypodensity in the right hepatic lobe, stable in appearance consistent with a cyst or hemangioma but too small to further characterize. Status post cholecystectomy. Unchanged biliary dilatation at the porta hepatis. No calcified choledocholithiasis. Pancreas: Atrophy of the pancreas.  No inflammation or focal mass. Spleen: Normal splenic size with scattered calcified granulomas. Linear subcapsular calcifications are stable. Adrenals/Urinary Tract: Bilateral nephrectomies. No adrenal masses. Transplanted  right lower quadrant kidney without obstructive uropathy or focal mass. No nephrolithiasis. Contracted urinary bladder without stones. Stomach/Bowel: The stomach is physiologically distended. There is a small stable hiatal hernia. There is normal small bowel rotation noted. Mild to moderate fluid-filled distention of small bowel loops in part due to lateral small and large bowel containing hernias in the right lower quadrant. No incarceration identified. Colonic diverticulosis without acute diverticulitis. Vascular/Lymphatic: IVC filter in place. Advanced atherosclerosis of the abdominal aorta and branch vessels. No aneurysm. Reproductive: Atrophic uterus.  No adnexal mass. Other: Scarring along the anterior abdominal wall with small fat containing upper abdominal ventral abdominal wall hernia. Injection granulomata overlying the right gluteal region. Soft tissue induration bilaterally overlies the lower gluteal muscles. Musculoskeletal: Discogenic sclerosis at T10-11 with endplate irregularity, stable in appearance. Stable trabecular thickening of the posterior elements at L1. Chronic degenerative change about both hips with periarticular and intra-articular loose bodies. No acute nor suspicious osseous abnormalities. IMPRESSION: 1. Similar to prior exam are fluid-filled dilated pelvic small bowel loops likely in part due to known right small and large bowel containing lower quadrant hernias. No evidence of incarceration. This is contributing to moderate small bowel dilatation up to 3.5 cm and may reflect a partial SBO or early SBO. 2. Other chronic stable findings as above. Electronically Signed   By: Ashley Royalty M.D.   On: 11/23/2016 02:16   Dg Abd Portable 1v-small Bowel Obstruction Protocol-initial, 8 Hr Delay  Result Date: 11/24/2016 CLINICAL DATA:  Small-bowel obstruction. EXAM: PORTABLE ABDOMEN - 1 VIEW COMPARISON:  11/23/2016.  CT 08/06/2016 . FINDINGS: NG tube noted projected over the distal stomach.  No bowel distention. Surgical clips noted over the abdomen and pelvis. IVC filter noted. Aortic atherosclerotic vascular calcification. Small sclerotic density right ilium, most likely bone island again noted. Degenerative changes lumbar spine and both hips. IMPRESSION: NG tube noted with its tip projected over the distal stomach. No bowel distention. Exam appears stable from prior exam. Electronically Signed   By: Marcello Moores  Register   On: 11/24/2016 07:20   Dg Abd Portable 1v-small Bowel Protocol-position Verification  Result Date: 11/23/2016 CLINICAL DATA:  NG tube placement. EXAM: PORTABLE ABDOMEN - 1 VIEW COMPARISON:  CT 11/23/2016.  KUB 08/07/2016 . FINDINGS: NG tube noted with its tip projected over the distal stomach. No gastric or bowel distention or free air. Surgical clips noted over the  abdomen pelves. IVC filter noted in stable position. Calcified pleural plaque on the right noted. IMPRESSION: NG tube noted with tip projected over the distal stomach. No gastric or bowel distention . Electronically Signed   By: Marcello Moores  Register   On: 11/23/2016 09:59     Assessment & Plan:  Plan  I am having Ms. Phillip Heal start on NONFORMULARY OR COMPOUNDED ITEM. I am also having her maintain her calcitRIOL, polyethylene glycol, mycophenolate, predniSONE, epoetin alfa, tacrolimus, acetaminophen, MAGNESIUM-OXIDE, feeding supplement (ENSURE ENLIVE), warfarin, fluticasone, NONFORMULARY OR COMPOUNDED ITEM, NONFORMULARY OR COMPOUNDED ITEM, allopurinol, gabapentin, levothyroxine, metoprolol tartrate, omeprazole, and NON FORMULARY.  Meds ordered this encounter  Medications  . NONFORMULARY OR COMPOUNDED ITEM    Sig: Cmp, lipid, tsh cbcd   - dx htn, hypothyroidism    Dispense:  1 each    Refill:  0    Problem List Items Addressed This Visit      Unprioritized   Hypothyroidism   Relevant Medications   NONFORMULARY OR COMPOUNDED ITEM   HTN (hypertension) (Chronic)   Relevant Medications   NONFORMULARY OR  COMPOUNDED ITEM    Other Visit Diagnoses    Axillary mass, right    -  Primary   Relevant Orders   MM Digital Diagnostic Bilat   US BREAST COMPLETE UNI RIGHT INC AXILLA   Fullness of breast       Relevant Orders   MM Digital Diagnostic Bilat   Mass of right side of neck       Relevant Orders   US SOFT TISSUE HEAD AND NECK   CRF (chronic renal failure), stage 3 (moderate)       Relevant Medications   NONFORMULARY OR COMPOUNDED ITEM      Follow-up: Return in about 6 months (around 06/07/2017).  Ann Held, DO

## 2016-12-08 ENCOUNTER — Encounter (HOSPITAL_COMMUNITY): Payer: Medicare Other

## 2016-12-08 ENCOUNTER — Ambulatory Visit (HOSPITAL_BASED_OUTPATIENT_CLINIC_OR_DEPARTMENT_OTHER)
Admission: RE | Admit: 2016-12-08 | Discharge: 2016-12-08 | Disposition: A | Discharge status: Home / Self Care | Payer: Medicare Other | Source: Ambulatory Visit | Attending: Family Medicine | Admitting: Family Medicine

## 2016-12-08 DIAGNOSIS — R221 Localized swelling, mass and lump, neck: Secondary | ICD-10-CM | POA: Diagnosis present

## 2016-12-15 ENCOUNTER — Encounter (HOSPITAL_COMMUNITY)
Admission: RE | Admit: 2016-12-15 | Discharge: 2016-12-15 | Disposition: A | Discharge status: Home / Self Care | Payer: Medicare Other | Source: Ambulatory Visit | Attending: Nephrology | Admitting: Nephrology

## 2016-12-15 DIAGNOSIS — D631 Anemia in chronic kidney disease: Secondary | ICD-10-CM | POA: Diagnosis not present

## 2016-12-15 DIAGNOSIS — N183 Chronic kidney disease, stage 3 unspecified: Secondary | ICD-10-CM

## 2016-12-15 LAB — FERRITIN: Ferritin: 829 ng/mL — ABNORMAL HIGH (ref 11–307)

## 2016-12-15 LAB — RENAL FUNCTION PANEL
Albumin: 3.9 g/dL (ref 3.5–5.0)
Anion gap: 8 (ref 5–15)
BUN: 36 mg/dL — AB (ref 6–20)
CALCIUM: 9.2 mg/dL (ref 8.9–10.3)
CO2: 25 mmol/L (ref 22–32)
CREATININE: 1.71 mg/dL — AB (ref 0.44–1.00)
Chloride: 104 mmol/L (ref 101–111)
GFR calc Af Amer: 33 mL/min — ABNORMAL LOW (ref >60)
GFR calc non Af Amer: 28 mL/min — ABNORMAL LOW (ref >60)
GLUCOSE: 113 mg/dL — AB (ref 65–99)
Phosphorus: 3.8 mg/dL (ref 2.5–4.6)
Potassium: 5.2 mmol/L — ABNORMAL HIGH (ref 3.5–5.1)
SODIUM: 137 mmol/L (ref 135–145)

## 2016-12-15 LAB — IRON AND TIBC
Iron: 120 ug/dL (ref 28–170)
Saturation Ratios: 56 % — ABNORMAL HIGH (ref 10.4–31.8)
TIBC: 214 ug/dL — AB (ref 250–450)
UIBC: 94 ug/dL

## 2016-12-15 LAB — POCT HEMOGLOBIN-HEMACUE: HEMOGLOBIN: 10.8 g/dL — AB (ref 12.0–15.0)

## 2016-12-15 LAB — PROTIME-INR
INR: 1.71
PROTHROMBIN TIME: 20.2 s — AB (ref 11.4–15.2)

## 2016-12-15 MED ORDER — EPOETIN ALFA 40000 UNIT/ML IJ SOLN
INTRAMUSCULAR | Status: AC
  Administered 2016-12-15: 14:00:00 40000 [IU]
  Filled 2016-12-15: qty 1

## 2016-12-15 MED ORDER — EPOETIN ALFA 10000 UNIT/ML IJ SOLN: 40000.0000 [IU] | INTRAMUSCULAR | Status: DC

## 2016-12-16 ENCOUNTER — Ambulatory Visit
Admission: RE | Admit: 2016-12-16 | Discharge: 2016-12-16 | Disposition: A | Discharge status: Home / Self Care | Payer: Medicare Other | Source: Ambulatory Visit | Attending: Family Medicine | Admitting: Family Medicine

## 2016-12-16 DIAGNOSIS — R922 Inconclusive mammogram: Secondary | ICD-10-CM | POA: Diagnosis not present

## 2016-12-16 DIAGNOSIS — R2231 Localized swelling, mass and lump, right upper limb: Secondary | ICD-10-CM

## 2016-12-16 DIAGNOSIS — N6489 Other specified disorders of breast: Secondary | ICD-10-CM

## 2017-01-05 DIAGNOSIS — Z940 Kidney transplant status: Secondary | ICD-10-CM | POA: Diagnosis not present

## 2017-01-05 DIAGNOSIS — Z4822 Encounter for aftercare following kidney transplant: Secondary | ICD-10-CM | POA: Diagnosis not present

## 2017-01-05 DIAGNOSIS — Z8673 Personal history of transient ischemic attack (TIA), and cerebral infarction without residual deficits: Secondary | ICD-10-CM | POA: Diagnosis not present

## 2017-01-05 DIAGNOSIS — I120 Hypertensive chronic kidney disease with stage 5 chronic kidney disease or end stage renal disease: Secondary | ICD-10-CM | POA: Diagnosis not present

## 2017-01-05 DIAGNOSIS — Z86718 Personal history of other venous thrombosis and embolism: Secondary | ICD-10-CM | POA: Diagnosis not present

## 2017-01-05 DIAGNOSIS — Z880 Allergy status to penicillin: Secondary | ICD-10-CM | POA: Diagnosis not present

## 2017-01-05 DIAGNOSIS — K432 Incisional hernia without obstruction or gangrene: Secondary | ICD-10-CM | POA: Diagnosis not present

## 2017-01-05 DIAGNOSIS — Z882 Allergy status to sulfonamides status: Secondary | ICD-10-CM | POA: Diagnosis not present

## 2017-01-05 DIAGNOSIS — N186 End stage renal disease: Secondary | ICD-10-CM | POA: Diagnosis not present

## 2017-01-05 DIAGNOSIS — E039 Hypothyroidism, unspecified: Secondary | ICD-10-CM | POA: Diagnosis not present

## 2017-01-05 DIAGNOSIS — Z79899 Other long term (current) drug therapy: Secondary | ICD-10-CM | POA: Diagnosis not present

## 2017-01-05 DIAGNOSIS — I4891 Unspecified atrial fibrillation: Secondary | ICD-10-CM | POA: Diagnosis not present

## 2017-01-05 DIAGNOSIS — Z888 Allergy status to other drugs, medicaments and biological substances status: Secondary | ICD-10-CM | POA: Diagnosis not present

## 2017-01-05 DIAGNOSIS — Z881 Allergy status to other antibiotic agents status: Secondary | ICD-10-CM | POA: Diagnosis not present

## 2017-01-05 DIAGNOSIS — I10 Essential (primary) hypertension: Secondary | ICD-10-CM | POA: Diagnosis not present

## 2017-01-05 DIAGNOSIS — M329 Systemic lupus erythematosus, unspecified: Secondary | ICD-10-CM | POA: Diagnosis not present

## 2017-01-05 DIAGNOSIS — K5090 Crohn's disease, unspecified, without complications: Secondary | ICD-10-CM | POA: Diagnosis not present

## 2017-01-05 DIAGNOSIS — D8989 Other specified disorders involving the immune mechanism, not elsewhere classified: Secondary | ICD-10-CM | POA: Diagnosis not present

## 2017-01-05 DIAGNOSIS — Z9049 Acquired absence of other specified parts of digestive tract: Secondary | ICD-10-CM | POA: Diagnosis not present

## 2017-01-06 ENCOUNTER — Encounter (HOSPITAL_COMMUNITY)
Admission: RE | Admit: 2017-01-06 | Discharge: 2017-01-06 | Disposition: A | Discharge status: Home / Self Care | Payer: Medicare Other | Source: Ambulatory Visit | Attending: Nephrology | Admitting: Nephrology

## 2017-01-06 DIAGNOSIS — N183 Chronic kidney disease, stage 3 unspecified: Secondary | ICD-10-CM

## 2017-01-06 DIAGNOSIS — D631 Anemia in chronic kidney disease: Secondary | ICD-10-CM | POA: Diagnosis not present

## 2017-01-06 LAB — POCT HEMOGLOBIN-HEMACUE: Hemoglobin: 11.2 g/dL — ABNORMAL LOW (ref 12.0–15.0)

## 2017-01-06 MED ORDER — EPOETIN ALFA 40000 UNIT/ML IJ SOLN
INTRAMUSCULAR | Status: AC
  Administered 2017-01-06: 40000 [IU]
  Filled 2017-01-06: qty 1

## 2017-01-06 MED ORDER — EPOETIN ALFA 10000 UNIT/ML IJ SOLN: 40000.0000 [IU] | INTRAMUSCULAR | Status: DC

## 2017-01-12 DIAGNOSIS — K432 Incisional hernia without obstruction or gangrene: Secondary | ICD-10-CM | POA: Diagnosis not present

## 2017-01-12 DIAGNOSIS — Z85528 Personal history of other malignant neoplasm of kidney: Secondary | ICD-10-CM | POA: Diagnosis not present

## 2017-01-12 DIAGNOSIS — Z940 Kidney transplant status: Secondary | ICD-10-CM | POA: Diagnosis not present

## 2017-01-12 DIAGNOSIS — Z905 Acquired absence of kidney: Secondary | ICD-10-CM | POA: Diagnosis not present

## 2017-01-20 DIAGNOSIS — Z940 Kidney transplant status: Secondary | ICD-10-CM | POA: Diagnosis not present

## 2017-01-20 DIAGNOSIS — Z79899 Other long term (current) drug therapy: Secondary | ICD-10-CM | POA: Diagnosis not present

## 2017-01-26 ENCOUNTER — Encounter (HOSPITAL_COMMUNITY)
Admission: RE | Admit: 2017-01-26 | Discharge: 2017-01-26 | Disposition: A | Discharge status: Home / Self Care | Payer: Medicare Other | Source: Ambulatory Visit | Attending: Nephrology | Admitting: Nephrology

## 2017-01-26 DIAGNOSIS — D631 Anemia in chronic kidney disease: Secondary | ICD-10-CM | POA: Diagnosis not present

## 2017-01-26 DIAGNOSIS — N183 Chronic kidney disease, stage 3 unspecified: Secondary | ICD-10-CM

## 2017-01-26 LAB — RENAL FUNCTION PANEL
ALBUMIN: 3.7 g/dL (ref 3.5–5.0)
ANION GAP: 8 (ref 5–15)
BUN: 41 mg/dL — ABNORMAL HIGH (ref 6–20)
CALCIUM: 9.1 mg/dL (ref 8.9–10.3)
CO2: 23 mmol/L (ref 22–32)
Chloride: 105 mmol/L (ref 101–111)
Creatinine, Ser: 1.9 mg/dL — ABNORMAL HIGH (ref 0.44–1.00)
GFR calc non Af Amer: 25 mL/min — ABNORMAL LOW (ref >60)
GFR, EST AFRICAN AMERICAN: 29 mL/min — AB (ref >60)
GLUCOSE: 99 mg/dL (ref 65–99)
PHOSPHORUS: 3.9 mg/dL (ref 2.5–4.6)
POTASSIUM: 4.4 mmol/L (ref 3.5–5.1)
SODIUM: 136 mmol/L (ref 135–145)

## 2017-01-26 LAB — PROTIME-INR
INR: 1.66
PROTHROMBIN TIME: 19.5 s — AB (ref 11.4–15.2)

## 2017-01-26 LAB — IRON AND TIBC
Iron: 145 ug/dL (ref 28–170)
SATURATION RATIOS: 74 % — AB (ref 10.4–31.8)
TIBC: 196 ug/dL — AB (ref 250–450)
UIBC: 51 ug/dL

## 2017-01-26 LAB — FERRITIN: FERRITIN: 750 ng/mL — AB (ref 11–307)

## 2017-01-26 LAB — POCT HEMOGLOBIN-HEMACUE: Hemoglobin: 11.1 g/dL — ABNORMAL LOW (ref 12.0–15.0)

## 2017-01-26 MED ORDER — EPOETIN ALFA 40000 UNIT/ML IJ SOLN
INTRAMUSCULAR | Status: AC
  Administered 2017-01-26: 40000 [IU] via SUBCUTANEOUS
  Filled 2017-01-26: qty 1

## 2017-01-26 MED ORDER — EPOETIN ALFA 10000 UNIT/ML IJ SOLN: 40000.0000 [IU] | INTRAMUSCULAR | Status: DC

## 2017-02-13 ENCOUNTER — Encounter: Payer: Self-pay | Admitting: Internal Medicine

## 2017-02-13 ENCOUNTER — Ambulatory Visit (INDEPENDENT_AMBULATORY_CARE_PROVIDER_SITE_OTHER): Payer: Medicare Other | Admitting: Internal Medicine

## 2017-02-13 VITALS — BP 118/76 | HR 67 | Ht 63.0 in | Wt 139.0 lb

## 2017-02-13 DIAGNOSIS — I4891 Unspecified atrial fibrillation: Secondary | ICD-10-CM | POA: Diagnosis not present

## 2017-02-13 DIAGNOSIS — G4733 Obstructive sleep apnea (adult) (pediatric): Secondary | ICD-10-CM

## 2017-02-13 NOTE — Progress Notes (Signed)
HPI Female never smoker followed for OSA NPSG 10/15/13 58.5/ hr. Weight 130 lbs  ---------------------------------------------------------------------------------- 12/11/14- 36 yoF Former patient-sleep study back in 90's. Currently on CPAP set on 10 through [place]. Medical problems include lupus, hypertension, chronic kidney disease/transplant, blood clots, Crohn's. FOLLOWS FOR: Review sleep study results with patient. NPSG 10/15/13 58.5/ hr. Weight 130 lbs   We had [facility] restart CPAP 10 we reviewed sleep study. She does qualified to continue CPAP. [facility]/CPAP 10  02/13/17- 76 year old female, Jehovah's Witness, never smoker followed for OSA , complicated by allergic rhinitis, anemia, aortic murmur, A. Fib, CKD3/ transplant, Crohn's disease, GERD, HBP, hypothyroid, lupus CPAP 10/[facility]> to new machine auto 5-15 today OSA; DME: [facility]. Pt states he wears CPAP nightly-due for new machine and supplies. Last seen 2016. No DL today.  Old machine doesn't seem to be stopping snoring any longer. Daytime breathing is comfortable with no acute episodes. Gets flu vaccine at PCP.  ROS-see HPI  + = positive Constitutional:   No-   weight loss, night sweats, fevers, chills, fatigue, lassitude. HEENT:   No-  headaches, difficulty swallowing, tooth/dental problems, sore throat,       No-  sneezing, itching, ear ache, nasal congestion, post nasal drip,  CV:  No-   chest pain, orthopnea, PND, swelling in lower extremities, anasarca,                                                           dizziness, palpitations Resp: + shortness of breath with exertion or at rest.              No-   productive cough,  No non-productive cough,  No- coughing up of blood.              No-   change in color of mucus.  No- wheezing.   Skin: No-   rash or lesions. GI:  No-   heartburn, indigestion, abdominal pain, nausea, vomiting,  GU:  MS:  No-   joint pain or swelling.   Neuro-     nothing unusual Psych:  No- change in mood or  affect. No depression or anxiety.  No memory loss.  OBJ- Physical Exam General- Alert, Oriented, Affect-appropriate, Distress- none acute Skin- rash-none, lesions- none, excoriation- none Lymphadenopathy- none Head- atraumatic            Eyes- Gross vision intact, PERRLA, conjunctivae and secretions clear            Ears- Hearing, canals-normal            Nose- +mucus bridging, no-Septal dev, mucus, polyps, erosion, perforation             Throat- Mallampati II-III , mucosa -Not dry , drainage- none, tonsils- atrophic Neck- flexible , trachea midline, no stridor , thyroid nl, carotid no bruit Chest - symmetrical excursion , unlabored           Heart/CV- RRR , no murmur , no gallop  , no rub, nl s1 s2                           - JVD- none , edema- none, stasis changes- none, varices- none           Lung- coarse breath sounds/unlabored, wheeze-  none, cough- none , dullness-none, rub- none           Chest wall-  Abd- Br/ Gen/ Rectal- Not done, not indicated Extrem- cyanosis- none, clubbing, none, atrophy- none, strength- nl Neuro- grossly intact to observation

## 2017-02-13 NOTE — Patient Instructions (Signed)
Order- DME [facility]- please replace old CPAP machine, change to auto 5-15, mask of choice, humidifier, supplies, AirView   Dx OSA  Please call if we can help

## 2017-02-16 DIAGNOSIS — Z7901 Long term (current) use of anticoagulants: Secondary | ICD-10-CM | POA: Diagnosis not present

## 2017-02-16 DIAGNOSIS — E039 Hypothyroidism, unspecified: Secondary | ICD-10-CM | POA: Diagnosis not present

## 2017-02-16 DIAGNOSIS — N2581 Secondary hyperparathyroidism of renal origin: Secondary | ICD-10-CM | POA: Diagnosis not present

## 2017-02-16 DIAGNOSIS — M329 Systemic lupus erythematosus, unspecified: Secondary | ICD-10-CM | POA: Diagnosis not present

## 2017-02-16 DIAGNOSIS — Z940 Kidney transplant status: Secondary | ICD-10-CM | POA: Diagnosis not present

## 2017-02-16 DIAGNOSIS — N2589 Other disorders resulting from impaired renal tubular function: Secondary | ICD-10-CM | POA: Diagnosis not present

## 2017-02-16 DIAGNOSIS — E782 Mixed hyperlipidemia: Secondary | ICD-10-CM | POA: Diagnosis not present

## 2017-02-16 DIAGNOSIS — I4891 Unspecified atrial fibrillation: Secondary | ICD-10-CM | POA: Diagnosis not present

## 2017-02-16 DIAGNOSIS — N184 Chronic kidney disease, stage 4 (severe): Secondary | ICD-10-CM | POA: Diagnosis not present

## 2017-02-16 DIAGNOSIS — D631 Anemia in chronic kidney disease: Secondary | ICD-10-CM | POA: Diagnosis not present

## 2017-02-16 DIAGNOSIS — M109 Gout, unspecified: Secondary | ICD-10-CM | POA: Diagnosis not present

## 2017-02-16 DIAGNOSIS — I129 Hypertensive chronic kidney disease with stage 1 through stage 4 chronic kidney disease, or unspecified chronic kidney disease: Secondary | ICD-10-CM | POA: Diagnosis not present

## 2017-02-16 DIAGNOSIS — Z23 Encounter for immunization: Secondary | ICD-10-CM | POA: Diagnosis not present

## 2017-02-18 NOTE — Assessment & Plan Note (Signed)
No download is available but he reports using CPAP every night and sleeping better with it. His machine is old and we need to replace it before it fails on them. Plan-replacement machine, changing to AutoPap 5-15

## 2017-02-18 NOTE — Assessment & Plan Note (Signed)
Rhythm is regular very nearly so on exam at this visit. Followed by cardiology.

## 2017-02-23 ENCOUNTER — Encounter (HOSPITAL_COMMUNITY): Payer: Medicare Other

## 2017-02-23 DIAGNOSIS — N390 Urinary tract infection, site not specified: Secondary | ICD-10-CM | POA: Diagnosis not present

## 2017-02-28 ENCOUNTER — Encounter (HOSPITAL_COMMUNITY)
Admission: RE | Admit: 2017-02-28 | Discharge: 2017-02-28 | Disposition: A | Discharge status: Home / Self Care | Payer: Medicare Other | Source: Ambulatory Visit | Attending: Nephrology | Admitting: Nephrology

## 2017-02-28 DIAGNOSIS — D631 Anemia in chronic kidney disease: Secondary | ICD-10-CM | POA: Insufficient documentation

## 2017-02-28 DIAGNOSIS — N183 Chronic kidney disease, stage 3 unspecified: Secondary | ICD-10-CM

## 2017-02-28 LAB — IRON AND TIBC
Iron: 90 ug/dL (ref 28–170)
Saturation Ratios: 45 % — ABNORMAL HIGH (ref 10.4–31.8)
TIBC: 200 ug/dL — ABNORMAL LOW (ref 250–450)
UIBC: 110 ug/dL

## 2017-02-28 LAB — RENAL FUNCTION PANEL
Albumin: 4 g/dL (ref 3.5–5.0)
Anion gap: 7 (ref 5–15)
BUN: 25 mg/dL — AB (ref 6–20)
CALCIUM: 9.4 mg/dL (ref 8.9–10.3)
CO2: 25 mmol/L (ref 22–32)
CREATININE: 1.5 mg/dL — AB (ref 0.44–1.00)
Chloride: 107 mmol/L (ref 101–111)
GFR calc Af Amer: 39 mL/min — ABNORMAL LOW (ref >60)
GFR calc non Af Amer: 33 mL/min — ABNORMAL LOW (ref >60)
GLUCOSE: 82 mg/dL (ref 65–99)
Phosphorus: 3.3 mg/dL (ref 2.5–4.6)
Potassium: 4.4 mmol/L (ref 3.5–5.1)
SODIUM: 139 mmol/L (ref 135–145)

## 2017-02-28 LAB — PROTIME-INR
INR: 3.25
PROTHROMBIN TIME: 32.9 s — AB (ref 11.4–15.2)

## 2017-02-28 LAB — POCT HEMOGLOBIN-HEMACUE: HEMOGLOBIN: 12 g/dL (ref 12.0–15.0)

## 2017-02-28 LAB — FERRITIN: FERRITIN: 957 ng/mL — AB (ref 11–307)

## 2017-02-28 MED ORDER — EPOETIN ALFA 10000 UNIT/ML IJ SOLN
40000.0000 [IU] | INTRAMUSCULAR | Status: DC
Start: 2017-02-28 — End: 2017-03-01

## 2017-03-02 ENCOUNTER — Encounter (HOSPITAL_COMMUNITY): Payer: Medicare Other

## 2017-03-16 ENCOUNTER — Encounter (HOSPITAL_COMMUNITY): Payer: Medicare Other

## 2017-04-06 ENCOUNTER — Encounter (HOSPITAL_COMMUNITY)
Admission: RE | Admit: 2017-04-06 | Discharge: 2017-04-06 | Disposition: A | Discharge status: Home / Self Care | Payer: Medicare Other | Source: Ambulatory Visit | Attending: Nephrology | Admitting: Nephrology

## 2017-04-06 VITALS — BP 158/74 | HR 84 | Temp 98.0°F | Resp 20

## 2017-04-06 DIAGNOSIS — D631 Anemia in chronic kidney disease: Secondary | ICD-10-CM | POA: Insufficient documentation

## 2017-04-06 DIAGNOSIS — N183 Chronic kidney disease, stage 3 unspecified: Secondary | ICD-10-CM

## 2017-04-06 LAB — RENAL FUNCTION PANEL
Albumin: 3.5 g/dL (ref 3.5–5.0)
Anion gap: 5 (ref 5–15)
BUN: 37 mg/dL — AB (ref 6–20)
CHLORIDE: 107 mmol/L (ref 101–111)
CO2: 25 mmol/L (ref 22–32)
CREATININE: 1.75 mg/dL — AB (ref 0.44–1.00)
Calcium: 9.1 mg/dL (ref 8.9–10.3)
GFR calc Af Amer: 32 mL/min — ABNORMAL LOW (ref >60)
GFR, EST NON AFRICAN AMERICAN: 28 mL/min — AB (ref >60)
Glucose, Bld: 104 mg/dL — ABNORMAL HIGH (ref 65–99)
Phosphorus: 3.8 mg/dL (ref 2.5–4.6)
Potassium: 4.2 mmol/L (ref 3.5–5.1)
SODIUM: 137 mmol/L (ref 135–145)

## 2017-04-06 LAB — IRON AND TIBC
IRON: 97 ug/dL (ref 28–170)
Saturation Ratios: 47 % — ABNORMAL HIGH (ref 10.4–31.8)
TIBC: 206 ug/dL — ABNORMAL LOW (ref 250–450)
UIBC: 109 ug/dL

## 2017-04-06 LAB — PROTIME-INR
INR: 2.88
PROTHROMBIN TIME: 29.9 s — AB (ref 11.4–15.2)

## 2017-04-06 LAB — FERRITIN: FERRITIN: 856 ng/mL — AB (ref 11–307)

## 2017-04-06 MED ORDER — EPOETIN ALFA 10000 UNIT/ML IJ SOLN: 40000.0000 [IU] | INTRAMUSCULAR | Status: DC

## 2017-04-06 MED ORDER — EPOETIN ALFA 40000 UNIT/ML IJ SOLN
INTRAMUSCULAR | Status: AC
  Administered 2017-04-06: 13:00:00 40000 [IU] via SUBCUTANEOUS
  Filled 2017-04-06: qty 1

## 2017-04-07 LAB — POCT HEMOGLOBIN-HEMACUE: HEMOGLOBIN: 10.5 g/dL — AB (ref 12.0–15.0)

## 2017-04-27 ENCOUNTER — Encounter (HOSPITAL_COMMUNITY)
Admission: RE | Admit: 2017-04-27 | Discharge: 2017-04-27 | Disposition: A | Discharge status: Home / Self Care | Payer: Medicare Other | Source: Ambulatory Visit | Attending: Nephrology | Admitting: Nephrology

## 2017-04-27 VITALS — BP 136/54 | HR 69 | Temp 98.1°F | Resp 20

## 2017-04-27 DIAGNOSIS — D631 Anemia in chronic kidney disease: Secondary | ICD-10-CM | POA: Insufficient documentation

## 2017-04-27 DIAGNOSIS — N183 Chronic kidney disease, stage 3 unspecified: Secondary | ICD-10-CM

## 2017-04-27 LAB — PROTIME-INR
INR: 2.52
PROTHROMBIN TIME: 27 s — AB (ref 11.4–15.2)

## 2017-04-27 LAB — POCT HEMOGLOBIN-HEMACUE: HEMOGLOBIN: 10.7 g/dL — AB (ref 12.0–15.0)

## 2017-04-27 MED ORDER — EPOETIN ALFA 40000 UNIT/ML IJ SOLN
INTRAMUSCULAR | Status: AC
  Administered 2017-04-27: 13:00:00 40000 [IU]
  Filled 2017-04-27: qty 1

## 2017-04-27 MED ORDER — EPOETIN ALFA 10000 UNIT/ML IJ SOLN: 40000.0000 [IU] | INTRAMUSCULAR | Status: DC

## 2017-05-18 ENCOUNTER — Encounter (HOSPITAL_COMMUNITY): Payer: Medicare Other

## 2017-05-29 DIAGNOSIS — Z940 Kidney transplant status: Secondary | ICD-10-CM | POA: Diagnosis not present

## 2017-05-29 DIAGNOSIS — N2589 Other disorders resulting from impaired renal tubular function: Secondary | ICD-10-CM | POA: Diagnosis not present

## 2017-05-29 DIAGNOSIS — M109 Gout, unspecified: Secondary | ICD-10-CM | POA: Diagnosis not present

## 2017-05-29 DIAGNOSIS — M329 Systemic lupus erythematosus, unspecified: Secondary | ICD-10-CM | POA: Diagnosis not present

## 2017-05-29 DIAGNOSIS — E782 Mixed hyperlipidemia: Secondary | ICD-10-CM | POA: Diagnosis not present

## 2017-05-29 DIAGNOSIS — N184 Chronic kidney disease, stage 4 (severe): Secondary | ICD-10-CM | POA: Diagnosis not present

## 2017-05-29 DIAGNOSIS — Z7901 Long term (current) use of anticoagulants: Secondary | ICD-10-CM | POA: Diagnosis not present

## 2017-05-29 DIAGNOSIS — D631 Anemia in chronic kidney disease: Secondary | ICD-10-CM | POA: Diagnosis not present

## 2017-05-29 DIAGNOSIS — Z8719 Personal history of other diseases of the digestive system: Secondary | ICD-10-CM | POA: Diagnosis not present

## 2017-05-29 DIAGNOSIS — I4891 Unspecified atrial fibrillation: Secondary | ICD-10-CM | POA: Diagnosis not present

## 2017-05-29 DIAGNOSIS — I129 Hypertensive chronic kidney disease with stage 1 through stage 4 chronic kidney disease, or unspecified chronic kidney disease: Secondary | ICD-10-CM | POA: Diagnosis not present

## 2017-05-29 DIAGNOSIS — N2581 Secondary hyperparathyroidism of renal origin: Secondary | ICD-10-CM | POA: Diagnosis not present

## 2017-06-06 ENCOUNTER — Ambulatory Visit (INDEPENDENT_AMBULATORY_CARE_PROVIDER_SITE_OTHER): Payer: Medicare Other | Admitting: Family Medicine

## 2017-06-06 ENCOUNTER — Encounter: Payer: Self-pay | Admitting: Family Medicine

## 2017-06-06 VITALS — BP 138/78 | HR 68 | Temp 97.9°F | Resp 16 | Ht 63.0 in | Wt 138.8 lb

## 2017-06-06 DIAGNOSIS — I10 Essential (primary) hypertension: Secondary | ICD-10-CM | POA: Diagnosis not present

## 2017-06-06 DIAGNOSIS — M722 Plantar fascial fibromatosis: Secondary | ICD-10-CM | POA: Diagnosis not present

## 2017-06-06 DIAGNOSIS — E039 Hypothyroidism, unspecified: Secondary | ICD-10-CM | POA: Diagnosis not present

## 2017-06-06 DIAGNOSIS — M25561 Pain in right knee: Secondary | ICD-10-CM | POA: Diagnosis not present

## 2017-06-06 DIAGNOSIS — E785 Hyperlipidemia, unspecified: Secondary | ICD-10-CM | POA: Insufficient documentation

## 2017-06-06 DIAGNOSIS — G8929 Other chronic pain: Secondary | ICD-10-CM | POA: Insufficient documentation

## 2017-06-06 LAB — LIPID PANEL
CHOLESTEROL: 153 mg/dL (ref 0–200)
HDL: 43.8 mg/dL (ref >39.00)
LDL CALC: 85 mg/dL (ref 0–99)
NonHDL: 109.01
TRIGLYCERIDES: 119 mg/dL (ref 0.0–149.0)
Total CHOL/HDL Ratio: 3
VLDL: 23.8 mg/dL (ref 0.0–40.0)

## 2017-06-06 LAB — COMPREHENSIVE METABOLIC PANEL
ALT: 8 U/L (ref 0–35)
AST: 14 U/L (ref 0–37)
Albumin: 4.1 g/dL (ref 3.5–5.2)
Alkaline Phosphatase: 166 U/L — ABNORMAL HIGH (ref 39–117)
BUN: 49 mg/dL — AB (ref 6–23)
CALCIUM: 9.2 mg/dL (ref 8.4–10.5)
CHLORIDE: 105 meq/L (ref 96–112)
CO2: 24 mEq/L (ref 19–32)
Creatinine, Ser: 1.89 mg/dL — ABNORMAL HIGH (ref 0.40–1.20)
GFR: 33.43 mL/min — ABNORMAL LOW (ref >60.00)
Glucose, Bld: 91 mg/dL (ref 70–99)
POTASSIUM: 4.8 meq/L (ref 3.5–5.1)
SODIUM: 138 meq/L (ref 135–145)
Total Bilirubin: 0.6 mg/dL (ref 0.2–1.2)
Total Protein: 7.2 g/dL (ref 6.0–8.3)

## 2017-06-06 LAB — TSH: TSH: 1.08 u[IU]/mL (ref 0.35–4.50)

## 2017-06-06 NOTE — Assessment & Plan Note (Signed)
Check labs con't meds 

## 2017-06-06 NOTE — Assessment & Plan Note (Addendum)
Encouraged heart healthy diet, increase exercise, avoid trans fats, consider a krill oil cap daily 

## 2017-06-06 NOTE — Progress Notes (Signed)
Patient ID: [name], female    DOB: 07-25-1940  Age: 77 y.o. MRN: [id number]    Subjective:  Subjective  HPI [name] presents for f/u lipid, bp and thyroid.  She c/o R facial pain but will see dentist.   She c/o arthritis in R knee and plantar fascitis -- she is willing to go to sport med   Review of Systems  Constitutional: Negative for appetite change, diaphoresis, fatigue and unexpected weight change.  Eyes: Negative for pain, redness and visual disturbance.  Respiratory: Negative for cough, chest tightness, shortness of breath and wheezing.   Cardiovascular: Negative for chest pain, palpitations and leg swelling.  Endocrine: Negative for cold intolerance, heat intolerance, polydipsia, polyphagia and polyuria.  Genitourinary: Negative for difficulty urinating, dysuria and frequency.  Musculoskeletal: Positive for arthralgias and gait problem.  Neurological: Negative for dizziness, light-headedness, numbness and headaches.    History Past Medical History:  Diagnosis Date  . Anemia    "when lupus flares up"  . Anxiety   . Arthritis    "in my joints"  . Atrial fibrillation ([place])   . CAP (community acquired pneumonia) 08/20/2014  . Crohn's disease ([place])   . DVT of leg (deep venous thrombosis) ([place]) 2012-8./13/2013   "have had one in each leg now"  . DVT of lower extremity, bilateral ([place])    "have had them in both legs; last one was on the RLE this year" (04/25/2012)  . GERD (gastroesophageal reflux disease)   . Gout 2016  . Hypertension   . Hypothyroidism   . Lower GI bleed 2012  . Numbness and tingling of foot    bilaterally  . OSA on CPAP   . Refusal of blood transfusion for reasons of conscience 12/20/2011   pt is Jehovah's Witness  . Renal cell carcinoma   . Renal disorder    S/P nephrectomy; dialysis; "working fine now" (12/20/11)  . SBO (small bowel obstruction) ([place]) 11/2016  . SLE (systemic lupus erythematosus) ([place])     She has a past surgical  history that includes Cesarean section (1984); Nephrectomy transplanted organ (2006); Colectomy (1998); Excisional hemorrhoidectomy; Cataract extraction w/ intraocular lens  implant, bilateral (2005-2010); Vena cava filter placement (2012); Insertion of dialysis catheter ([id number]); Cholecystectomy (1995); Colostomy (1998); Colostomy reversal (1999); Bone marrow biopsy (1993; 1995); Abdominal exploration surgery (1995); Parathyroid implant removal (Left); Colonoscopy (N/A, 08/30/2013); and Breast excisional biopsy.   Her family history includes Heart disease in her mother; Hypertension in her unknown relative; Sleep apnea in her sister.She reports that  has never smoked. she has never used smokeless tobacco. She reports that she does not drink alcohol or use drugs.  Current Outpatient Medications on File Prior to Visit  Medication Sig Dispense Refill  . acetaminophen (TYLENOL) 325 MG tablet Take 650 mg by mouth every 6 (six) hours as needed for mild pain.    Marland Kitchen allopurinol (ZYLOPRIM) 100 MG tablet Take 1 tablet (100 mg total) by mouth 2 (two) times daily. 30 tablet 2  . calcitRIOL (ROCALTROL) 0.25 MCG capsule Take 0.5 mcg by mouth daily.     Marland Kitchen epoetin alfa (EPOGEN,PROCRIT) 93810 UNIT/ML injection Inject 40,000 Units into the skin every 21 ( twenty-one) days.     . feeding supplement, ENSURE ENLIVE, (ENSURE ENLIVE) LIQD Take 237 mLs by mouth 2 (two) times daily between meals. 60 Bottle 12  . fluticasone (FLONASE) 50 MCG/ACT nasal spray Place 2 sprays into both nostrils daily. (Patient taking differently: Place 2 sprays into both  nostrils daily as needed for allergies. ) 16 g 1  . gabapentin (NEURONTIN) 100 MG capsule Take 2 capsules (200 mg total) by mouth at bedtime.    Marland Kitchen levothyroxine (SYNTHROID, LEVOTHROID) 100 MCG tablet Take 1 tablet (100 mcg total) by mouth daily.    Marland Kitchen MAGNESIUM-OXIDE 400 (241.3 MG) MG tablet Take 400 mg by mouth 2 (two) times daily.  0  . metoprolol tartrate (LOPRESSOR) 50 MG  tablet Take 50 mg by mouth 2 (two) times daily.    . mycophenolate (MYFORTIC) 180 MG EC tablet Take 360 mg by mouth 2 (two) times daily.      . NON FORMULARY 1 each by Other route See admin instructions. CPAP nightly    . NONFORMULARY OR COMPOUNDED ITEM Lipid , TSH----  DX hypothyroid, chronic kidney disease, stage 4 1 each 0  . NONFORMULARY OR COMPOUNDED ITEM Compression hose-- thigh high  20-30 mm hg  #1 as directed Dx-- varicose veins b/l with pain 1 each 0  . NONFORMULARY OR COMPOUNDED ITEM Cmp, lipid, tsh cbcd   - dx htn, hypothyroidism 1 each 0  . omeprazole (PRILOSEC) 20 MG capsule Take 20 mg by mouth daily.    . polyethylene glycol (MIRALAX / GLYCOLAX) packet Take 17 g by mouth daily.     . predniSONE (DELTASONE) 5 MG tablet Take 5 mg by mouth daily.     . tacrolimus (PROGRAF) 1 MG capsule Take 3 mg by mouth See admin instructions. Take 4 mg by mouth in the morning and take 3 mg by mouth at noon    . warfarin (COUMADIN) 2.5 MG tablet Take 2.5-5 mg by mouth See admin instructions. Take 71m by mouth on Tuesday and Thursday. Take 2.5 mg by mouth on all other days     No current facility-administered medications on file prior to visit.      Objective:  Objective  Physical Exam  Constitutional: She is oriented to person, place, and time. She appears well-developed and well-nourished.  HENT:  Head: Normocephalic and atraumatic.  Eyes: Conjunctivae and EOM are normal.  Neck: Normal range of motion. Neck supple. No JVD present. Carotid bruit is not present. No thyromegaly present.  Cardiovascular: Normal rate, regular rhythm and normal heart sounds.  No murmur heard. Pulmonary/Chest: Effort normal and breath sounds normal. No respiratory distress. She has no wheezes. She has no rales. She exhibits no tenderness.  Musculoskeletal: She exhibits no edema.  Neurological: She is alert and oriented to person, place, and time.  Psychiatric: She has a normal mood and affect.  Nursing note and  vitals reviewed.  BP 138/78 (BP Location: Right Arm, Cuff Size: Normal)   Pulse 68   Temp 97.9 F (36.6 C) (Oral)   Resp 16   Ht 5' 3"  (1.6 m)   Wt 138 lb 12.8 oz (63 kg)   LMP 03/05/2014   SpO2 98%   BMI 24.59 kg/m  Wt Readings from Last 3 Encounters:  06/06/17 138 lb 12.8 oz (63 kg)  02/13/17 139 lb (63 kg)  12/05/16 140 lb 4 oz (63.6 kg)     Lab Results  Component Value Date   WBC 7.7 11/24/2016   HGB 10.7 (L) 04/27/2017   HCT 42.5 11/24/2016   PLT 184 11/24/2016   GLUCOSE 104 (H) 04/06/2017   CHOL 165 07/18/2016   TRIG 151.0 (H) 07/18/2016   HDL 47.50 07/18/2016   LDLCALC 87 07/18/2016   ALT 13 (L) 11/22/2016   AST 16 11/22/2016  NA 137 04/06/2017   K 4.2 04/06/2017   CL 107 04/06/2017   CREATININE 1.75 (H) 04/06/2017   BUN 37 (H) 04/06/2017   CO2 25 04/06/2017   TSH 0.35 07/18/2016   INR 2.52 04/27/2017   HGBA1C 6.5 (H) 11/12/2014    No results found.   Assessment & Plan:  Plan  I am having Anaisabel D. Mapel maintain her calcitRIOL, polyethylene glycol, mycophenolate, predniSONE, epoetin alfa, tacrolimus, acetaminophen, MAGNESIUM-OXIDE, feeding supplement (ENSURE ENLIVE), warfarin, fluticasone, NONFORMULARY OR COMPOUNDED ITEM, NONFORMULARY OR COMPOUNDED ITEM, allopurinol, gabapentin, levothyroxine, omeprazole, NON FORMULARY, NONFORMULARY OR COMPOUNDED ITEM, and metoprolol tartrate.  No orders of the defined types were placed in this encounter.   Problem List Items Addressed This Visit      Unprioritized   Chronic pain of right knee   Relevant Orders   Ambulatory referral to Sports Medicine   HTN (hypertension) (Chronic)    Well controlled, no changes to meds. Encouraged heart healthy diet such as the DASH diet and exercise as tolerated.       Relevant Medications   metoprolol tartrate (LOPRESSOR) 50 MG tablet   Other Relevant Orders   Comprehensive metabolic panel   Lipid panel   TSH   Hyperlipidemia LDL goal <100    Encouraged heart healthy  diet, increase exercise, avoid trans fats, consider a krill oil cap daily      Relevant Medications   metoprolol tartrate (LOPRESSOR) 50 MG tablet   Other Relevant Orders   Comprehensive metabolic panel   Lipid panel   TSH   Hypothyroidism - Primary    Check labs con't meds      Relevant Medications   metoprolol tartrate (LOPRESSOR) 50 MG tablet   Other Relevant Orders   TSH   Plantar fasciitis   Relevant Orders   Ambulatory referral to Sports Medicine      Follow-up: Return in about 6 months (around 12/04/2017), or if symptoms worsen or fail to improve.  Ann Held, DO

## 2017-06-06 NOTE — Assessment & Plan Note (Signed)
Well controlled, no changes to meds. Encouraged heart healthy diet such as the DASH diet and exercise as tolerated.  °

## 2017-06-06 NOTE — Patient Instructions (Signed)

## 2017-06-19 ENCOUNTER — Ambulatory Visit (INDEPENDENT_AMBULATORY_CARE_PROVIDER_SITE_OTHER): Payer: Medicare Other | Admitting: Family Medicine

## 2017-06-19 ENCOUNTER — Encounter: Payer: Self-pay | Admitting: Family Medicine

## 2017-06-19 DIAGNOSIS — G8929 Other chronic pain: Secondary | ICD-10-CM

## 2017-06-19 DIAGNOSIS — M25561 Pain in right knee: Secondary | ICD-10-CM

## 2017-06-19 DIAGNOSIS — M722 Plantar fascial fibromatosis: Secondary | ICD-10-CM

## 2017-06-19 NOTE — Patient Instructions (Signed)
Your pain is due to arthritis. These are the different medications you can take for this: Tylenol 567m 1-2 tabs three times a day for pain. Capsaicin, aspercreme, or biofreeze topically up to four times a day may also help with pain. Some supplements that may help for arthritis: Boswellia extract, curcumin, pycnogenol Cortisone injections are an option. If cortisone injections do not help, there are different types of shots that may help but they take longer to take effect. It's important that you continue to stay active. Straight leg raises, knee extensions 3 sets of 10 once a day (add ankle weight if these become too easy). Consider physical therapy to strengthen muscles around the joint that hurts to take pressure off of the joint itself. Shoe inserts with good arch support may be helpful. Heat or ice 15 minutes at a time 3-4 times a day as needed to help with pain. Water aerobics and cycling with low resistance are the best two types of exercise for arthritis though any exercise is ok as long as it doesn't worsen the pain. Follow up with me in 6 weeks.  You have plantar fasciitis Take tylenol as needed for pain  Plantar fascia stretch for 20-30 seconds (do 3 of these) in morning Lowering/raise on a step exercises 3 x 10 once or twice a day - this is very important for long term recovery. Can add heel walks, toe walks forward and backward as well Ice heel for 15 minutes as needed. Avoid flat shoes/barefoot walking as much as possible. Arch straps have been shown to help with pain. Inserts are important (dr. sZoe Lanactive series, spencos, our green insoles, custom orthotics). Steroid injection is a consideration for short term pain relief if you are struggling. Physical therapy is also an option. Follow up with me in 6 weeks.

## 2017-06-20 ENCOUNTER — Encounter: Payer: Self-pay | Admitting: Family Medicine

## 2017-06-20 NOTE — Assessment & Plan Note (Signed)
arch binders, arch supports.  Tylenol if needed.  Shown home exercises and stretches to do daily.  Consider physical therapy, injection.  F/u in 6 weeks.

## 2017-06-20 NOTE — Progress Notes (Signed)
PCP and consultation requested by: Ann Held, DO  Subjective:   HPI: Patient is a 77 y.o. female here for right knee, left foot pain.  Patient reports she's had about 1 month of anterior right knee pain. Feels better past couple days but bothers more at nighttime, with walking. Pain level 2/10 and dull. No skin changes, numbness. She also reports plantar left foot/heel pain up to 7/10 level and sharp. Also worse with walking. No injuries, skin changes.  Past Medical History:  Diagnosis Date  . Anemia    "when lupus flares up"  . Anxiety   . Arthritis    "in my joints"  . Atrial fibrillation ([place])   . CAP (community acquired pneumonia) 08/20/2014  . Crohn's disease ([place])   . DVT of leg (deep venous thrombosis) ([place]) 2012-8./13/2013   "have had one in each leg now"  . DVT of lower extremity, bilateral ([place])    "have had them in both legs; last one was on the RLE this year" (04/25/2012)  . GERD (gastroesophageal reflux disease)   . Gout 2016  . Hypertension   . Hypothyroidism   . Lower GI bleed 2012  . Numbness and tingling of foot    bilaterally  . OSA on CPAP   . Refusal of blood transfusion for reasons of conscience 12/20/2011   pt is Jehovah's Witness  . Renal cell carcinoma   . Renal disorder    S/P nephrectomy; dialysis; "working fine now" (12/20/11)  . SBO (small bowel obstruction) ([place]) 11/2016  . SLE (systemic lupus erythematosus) ([place])     Current Outpatient Medications on File Prior to Visit  Medication Sig Dispense Refill  . acetaminophen (TYLENOL) 325 MG tablet Take 650 mg by mouth every 6 (six) hours as needed for mild pain.    Marland Kitchen allopurinol (ZYLOPRIM) 100 MG tablet Take 1 tablet (100 mg total) by mouth 2 (two) times daily. 30 tablet 2  . calcitRIOL (ROCALTROL) 0.25 MCG capsule Take 0.5 mcg by mouth daily.     Marland Kitchen epoetin alfa (EPOGEN,PROCRIT) 32951 UNIT/ML injection Inject 40,000 Units into the skin every 21 ( twenty-one) days.     . feeding  supplement, ENSURE ENLIVE, (ENSURE ENLIVE) LIQD Take 237 mLs by mouth 2 (two) times daily between meals. 60 Bottle 12  . fluticasone (FLONASE) 50 MCG/ACT nasal spray Place 2 sprays into both nostrils daily. (Patient taking differently: Place 2 sprays into both nostrils daily as needed for allergies. ) 16 g 1  . gabapentin (NEURONTIN) 100 MG capsule Take 2 capsules (200 mg total) by mouth at bedtime.    Marland Kitchen levothyroxine (SYNTHROID, LEVOTHROID) 100 MCG tablet Take 1 tablet (100 mcg total) by mouth daily.    Marland Kitchen MAGNESIUM-OXIDE 400 (241.3 MG) MG tablet Take 400 mg by mouth 2 (two) times daily.  0  . metoprolol tartrate (LOPRESSOR) 50 MG tablet Take 50 mg by mouth 2 (two) times daily.    . mycophenolate (MYFORTIC) 180 MG EC tablet Take 360 mg by mouth 2 (two) times daily.      . NON FORMULARY 1 each by Other route See admin instructions. CPAP nightly    . NONFORMULARY OR COMPOUNDED ITEM Lipid , TSH----  DX hypothyroid, chronic kidney disease, stage 4 1 each 0  . NONFORMULARY OR COMPOUNDED ITEM Compression hose-- thigh high  20-30 mm hg  #1 as directed Dx-- varicose veins b/l with pain 1 each 0  . NONFORMULARY OR COMPOUNDED ITEM Cmp, lipid, tsh cbcd   -  dx htn, hypothyroidism 1 each 0  . omeprazole (PRILOSEC) 20 MG capsule Take 20 mg by mouth daily.    . polyethylene glycol (MIRALAX / GLYCOLAX) packet Take 17 g by mouth daily.     . predniSONE (DELTASONE) 5 MG tablet Take 5 mg by mouth daily.     . tacrolimus (PROGRAF) 1 MG capsule Take 3 mg by mouth See admin instructions. Take 4 mg by mouth in the morning and take 3 mg by mouth at noon    . warfarin (COUMADIN) 2.5 MG tablet Take 2.5-5 mg by mouth See admin instructions. Take 49m by mouth on Tuesday and Thursday. Take 2.5 mg by mouth on all other days     No current facility-administered medications on file prior to visit.     Past Surgical History:  Procedure Laterality Date  . [place]  "found sluggish bone marrow"  (04/25/2012)  . BONE MARROW BIOPSY  1993; 1995  . BREAST EXCISIONAL BIOPSY     right  . CATARACT EXTRACTION W/ INTRAOCULAR LENS  IMPLANT, BILATERAL  2005-2010  . CESAREAN SECTION  1984  . CHOLECYSTECTOMY  1995  . COLECTOMY  1998   "removal of abscess" (04/25/2012)  . COLONOSCOPY N/A 08/30/2013   Procedure: COLONOSCOPY;  Surgeon: JWinfield Cunas, MD;  [facility];  Service: Endoscopy;  Laterality: N/A;  . COLOSTOMY  1998  . COLOSTOMY REVERSAL  1999   "6 months after placed" (04/25/2012)  . EXCISIONAL HEMORRHOIDECTOMY    . INSERTION OF DIALYSIS CATHETER  1988-2006   "peritoneal and hemodialysis; had multiple grafts and fistulas; last graft clotted off 2012"  . NEPHRECTOMY TRANSPLANTED ORGAN  2006   bilaterally  . PARATHYROID IMPLANT REMOVAL Left    removed parathyroid from neck and implanted in arm  . VENA CAVA FILTER PLACEMENT  2012    Allergies  Allergen Reactions  . Amoxicillin Anaphylaxis  . Ampicillin Anaphylaxis  . Ciprofloxacin Swelling and Other (See Comments)    "of my throat"  . Erythromycin Anaphylaxis  . Penicillins Anaphylaxis    Has patient had a PCN reaction causing immediate rash, facial/tongue/throat swelling, SOB or lightheadedness with hypotension: Yes Has patient had a PCN reaction causing severe rash involving mucus membranes or skin necrosis: No Has patient had a PCN reaction that required hospitalization: Yes Has patient had a PCN reaction occurring within the last 10 years: Yes If all of the above answers are "NO", then may proceed with Cephalosporin use.  . Sulfa Antibiotics Itching  . Sulfamethoxazole-Trimethoprim Itching  . Tetracycline Anaphylaxis  . Labetalol Nausea And Vomiting    Social History   Socioeconomic History  . Marital status: Married    Spouse name: Not on file  . Number of children: 3  . Years of education: Not on file  . Highest education level: Not on file  Social Needs  . Financial resource strain: Not on  file  . Food insecurity - worry: Not on file  . Food insecurity - inability: Not on file  . Transportation needs - medical: Not on file  . Transportation needs - non-medical: Not on file  Occupational History  . Occupation: retired-bank tPsychologist, clinical RETIRED  Tobacco Use  . Smoking status: Never Smoker  . Smokeless tobacco: Never Used  Substance and Sexual Activity  . Alcohol use: No  . Drug use: No  . Sexual activity: Yes    Birth control/protection: Post-menopausal  Other Topics Concern  . Not  on file  Social History Narrative  . Not on file    Family History  Problem Relation Age of Onset  . Heart disease Mother   . Hypertension Unknown        runs in family  . Sleep apnea Sister     BP 117/83   Pulse 84   Ht _0  (1.6 m)   Wt 137 lb (62.1 kg)   LMP 03/05/2014   BMI 24.27 kg/m   Review of Systems: See HPI above.     Objective:  Physical Exam:  Gen: NAD, comfortable in exam room  Right knee: No gross deformity, ecchymoses, swelling. No TTP. FROM with 5/5 strength. Negative ant/post drawers. Negative valgus/varus testing. Negative lachmanns. Negative mcmurrays, apleys, patellar apprehension. NV intact distally.  Left knee: No deformity. FROM with 5/5 strength. No tenderness to palpation. NVI distally.  Left foot/ankle: Cavus. No other gross deformity, swelling, ecchymoses FROM with 5/5 strength TTP mildly at anterior portion of calcaneus at plantar fascia insertion. Negative ant drawer and talar tilt.   Negative syndesmotic compression. Negative calcaneal squeeze. Thompsons test negative. NV intact distally.   Assessment & Plan:  1. Right knee pain - consistent with arthritis.  Exam reassuring.  Tylenol, topical medications, supplements reviewed.  Shown home exercises to do daily.  Consider injection, physical therapy.  F/u in 6 weeks.  2. Left plantar fasciitis - arch binders, arch supports.  Tylenol if needed.  Shown home exercises  and stretches to do daily.  Consider physical therapy, injection.  F/u in 6 weeks.

## 2017-06-20 NOTE — Assessment & Plan Note (Signed)
consistent with arthritis.  Exam reassuring.  Tylenol, topical medications, supplements reviewed.  Shown home exercises to do daily.  Consider injection, physical therapy.  F/u in 6 weeks.

## 2017-06-22 ENCOUNTER — Encounter: Payer: Self-pay | Admitting: Internal Medicine

## 2017-06-22 ENCOUNTER — Ambulatory Visit (INDEPENDENT_AMBULATORY_CARE_PROVIDER_SITE_OTHER): Payer: Medicare Other | Admitting: Internal Medicine

## 2017-06-22 ENCOUNTER — Ambulatory Visit (INDEPENDENT_AMBULATORY_CARE_PROVIDER_SITE_OTHER)
Admission: RE | Admit: 2017-06-22 | Discharge: 2017-06-22 | Disposition: A | Discharge status: Home / Self Care | Payer: Medicare Other | Source: Ambulatory Visit | Attending: Internal Medicine | Admitting: Internal Medicine

## 2017-06-22 VITALS — BP 128/72 | HR 69 | Ht 63.0 in | Wt 141.0 lb

## 2017-06-22 DIAGNOSIS — G4733 Obstructive sleep apnea (adult) (pediatric): Secondary | ICD-10-CM | POA: Diagnosis not present

## 2017-06-22 DIAGNOSIS — M25511 Pain in right shoulder: Secondary | ICD-10-CM | POA: Diagnosis not present

## 2017-06-22 DIAGNOSIS — W19XXXS Unspecified fall, sequela: Secondary | ICD-10-CM | POA: Diagnosis not present

## 2017-06-22 NOTE — Patient Instructions (Signed)
Order- DME  Advanced- please change auto pap range to 5-20, continue mask of choice, humidifier,, supplies, AirView  Order- Xray R shoulder 2v     Dx fell- can't raise arm  If you shoulder doesn't get back to normal, ask your primary doctor about referral to an orthopedist

## 2017-06-22 NOTE — Assessment & Plan Note (Signed)
Fell yesterday hurting her right shoulder.  Cannot raise arm and shoulder. We can get x-ray while she is here but if any question, she should ask her PCP about referral to orthopedics. Plan x-ray right shoulder 3V

## 2017-06-22 NOTE — Assessment & Plan Note (Signed)
Benefits from CPAP-sleeps better.  Good compliance and control documented.  Because of reports of breakthrough snoring, we will increase the pressure range to 5-20 if tolerated.

## 2017-06-22 NOTE — Progress Notes (Signed)
HPI Female never smoker followed for OSA NPSG 10/15/13 58.5/ hr. Weight 130 lbs  ---------------------------------------------------------------------------------- 02/13/17- 77 year old female, Jehovah's Witness, never smoker followed for OSA , complicated by allergic rhinitis, anemia, aortic murmur, A. Fib, CKD3/ transplant, Crohn's disease, GERD, HBP, hypothyroid, lupus CPAP 10/[facility]> to new machine auto 5-15 today OSA; DME: [facility]. Pt states he wears CPAP nightly-due for new machine and supplies. Last seen 2016. No DL today.  Old machine doesn't seem to be stopping snoring any longer. Daytime breathing is comfortable with no acute episodes. Gets flu vaccine at PCP.  06/22/17-  77 year old female, Jehovah's Witness, never smoker followed for OSA , complicated by allergic rhinitis, anemia, aortic murmur, A. Fib, CKD3/ transplant, Crohn's disease, GERD, HBP, hypothyroid, lupus CPAP auto 5-15/[facility]>> 5-20 today for snoring ----OSA: DME [facility]. Pt wears CPAP nightly and DL attached. No new supplies needed. Pt fell yesterday on Right Arm-has not been checked out.  Cannot raise right arm and shoulder. Told she still snores at times wearing her CPAP.  Feels rested and likes new machine. Download 100% compliance, AHI 1.6/hour  ROS-see HPI  + = positive Constitutional:   No-   weight loss, night sweats, fevers, chills, fatigue, lassitude. HEENT:   No-  headaches, difficulty swallowing, tooth/dental problems, sore throat,       No-  sneezing, itching, ear ache, nasal congestion, post nasal drip,  CV:  No-   chest pain, orthopnea, PND, swelling in lower extremities, anasarca,                                                           dizziness, palpitations Resp: + shortness of breath with exertion or at rest.              No-   productive cough,  No non-productive cough,  No- coughing up of blood.              No-   change in color of mucus.  No- wheezing.   Skin: No-   rash or lesions. GI:  No-    heartburn, indigestion, abdominal pain, nausea, vomiting,  GU:  MS:  No-   joint pain or swelling.   Neuro-     nothing unusual Psych:  No- change in mood or affect. No depression or anxiety.  No memory loss.  OBJ- Physical Exam General- Alert, Oriented, Affect-appropriate, Distress- none acute Skin- rash-none, lesions- none, excoriation- none Lymphadenopathy- none Head- atraumatic            Eyes- Gross vision intact, PERRLA, conjunctivae and secretions clear            Ears- Hearing, canals-normal            Nose- +mucus bridging, no-Septal dev, mucus, polyps, erosion, perforation             Throat- Mallampati II-III , mucosa -Not dry , drainage- none, tonsils- atrophic Neck- flexible , trachea midline, no stridor , thyroid nl, carotid no bruit Chest - symmetrical excursion , unlabored           Heart/CV- RRR , no murmur heard , no gallop  , no rub, nl s1 s2                           -  JVD- none , edema- none, stasis changes- none, varices- none           Lung- coarse breath sounds/unlabored, wheeze- none, cough- none , dullness-none, rub- none           Chest wall-  Abd- Br/ Gen/ Rectal- Not done, not indicated Extrem- cyanosis- none, clubbing, none, atrophy- none, strength- nl. + Right shoulder painful-cannot raise at shoulder Neuro- grossly intact to observation

## 2017-06-26 ENCOUNTER — Telehealth: Payer: Self-pay | Admitting: Internal Medicine

## 2017-06-26 NOTE — Telephone Encounter (Signed)
Called and spoke with patient, she is aware, nothing further needed.

## 2017-06-28 ENCOUNTER — Ambulatory Visit: Payer: Self-pay

## 2017-06-28 ENCOUNTER — Ambulatory Visit: Payer: Medicare Other | Admitting: Family Medicine

## 2017-06-28 NOTE — Telephone Encounter (Signed)
Patient called in with c/o "cold symtoms." She says "I've had this cough for over a week, coughing up thick, yellowish mucus. I also have nasal congestion blowing out yellowish mucus with some streaks of blood. I believe I burst a blood vessel in my eye, because my eye is red. The cough is bad in the morning trying to get all the mucus up, but as the day goes on it gets better. Then I have the cough in the afternoon, the same way. I had a temperature last week x 24 hours 101.9. I've taken coricidin-D, zyrtec and robitussin. I've been a little dizzy at times, probably from the head congestion." She denies pain, but says if she exerts herself she is a little short of breath. According to protocol, see PCP within 24 hours, no availability with provider, appointment made today with Dr. Nani Ravens, care advice given, she verbalized understanding.  Reason for Disposition . [1] Using nasal washes and pain medicine > 24 hours AND [2] sinus pain (around cheekbone or eye) persists  Answer Assessment - Initial Assessment Questions 1. ONSET: "When did the cough begin?"      Over a week 2. SEVERITY: "How bad is the cough today?"      First thing it's right much, but eases as the day goes 3. RESPIRATORY DISTRESS: "Describe your breathing."      Not too bad resting, sob with exertion 4. FEVER: "Do you have a fever?" If so, ask: "What is your temperature, how was it measured, and when did it start?"     Not now, but last week 101.9 x 24 hours 5. SPUTUM: "Describe the color of your sputum" (clear, white, yellow, green)     Thick yellow 6. HEMOPTYSIS: "Are you coughing up any blood?" If so ask: "How much?" (flecks, streaks, tablespoons, etc.)     No 7. CARDIAC HISTORY: "Do you have any history of heart disease?" (e.g., heart attack, congestive heart failure)      A-fib 8. LUNG HISTORY: "Do you have any history of lung disease?"  (e.g., pulmonary embolus, asthma, emphysema)     No 9. PE RISK FACTORS: "Do you have a  history of blood clots?" (or: recent major surgery, recent prolonged travel, bedridden )    Yes 10. OTHER SYMPTOMS: "Do you have any other symptoms?" (e.g., runny nose, wheezing, chest pain)       Nasal congestion-yellowish mucus with some streaks of blood sometimes, runny nose, chest congestion 11. PREGNANCY: "Is there any chance you are pregnant?" "When was your last menstrual period?"       No 12. TRAVEL: "Have you traveled out of the country in the last month?" (e.g., travel history, exposures)       No  Protocols used: [place]

## 2017-06-28 NOTE — Telephone Encounter (Signed)
Pt to see Dr. Nani Ravens later today.

## 2017-06-29 ENCOUNTER — Encounter (HOSPITAL_COMMUNITY)
Admission: RE | Admit: 2017-06-29 | Discharge: 2017-06-29 | Disposition: A | Discharge status: Home / Self Care | Payer: Medicare Other | Source: Ambulatory Visit | Attending: Nephrology | Admitting: Nephrology

## 2017-06-29 VITALS — BP 146/60 | HR 71 | Temp 98.9°F | Resp 18

## 2017-06-29 DIAGNOSIS — N183 Chronic kidney disease, stage 3 unspecified: Secondary | ICD-10-CM

## 2017-06-29 DIAGNOSIS — D631 Anemia in chronic kidney disease: Secondary | ICD-10-CM | POA: Insufficient documentation

## 2017-06-29 LAB — IRON AND TIBC
Iron: 32 ug/dL (ref 28–170)
Saturation Ratios: 17 % (ref 10.4–31.8)
TIBC: 188 ug/dL — ABNORMAL LOW (ref 250–450)
UIBC: 156 ug/dL

## 2017-06-29 LAB — RENAL FUNCTION PANEL
Albumin: 3.5 g/dL (ref 3.5–5.0)
Anion gap: 10 (ref 5–15)
BUN: 39 mg/dL — AB (ref 6–20)
CHLORIDE: 106 mmol/L (ref 101–111)
CO2: 20 mmol/L — AB (ref 22–32)
CREATININE: 1.61 mg/dL — AB (ref 0.44–1.00)
Calcium: 9.1 mg/dL (ref 8.9–10.3)
GFR calc Af Amer: 35 mL/min — ABNORMAL LOW (ref >60)
GFR, EST NON AFRICAN AMERICAN: 30 mL/min — AB (ref >60)
Glucose, Bld: 96 mg/dL (ref 65–99)
Phosphorus: 3.5 mg/dL (ref 2.5–4.6)
Potassium: 4.4 mmol/L (ref 3.5–5.1)
Sodium: 136 mmol/L (ref 135–145)

## 2017-06-29 LAB — PROTIME-INR
INR: 4.56 — AB
PROTHROMBIN TIME: 42.9 s — AB (ref 11.4–15.2)

## 2017-06-29 LAB — POCT HEMOGLOBIN-HEMACUE: HEMOGLOBIN: 9.4 g/dL — AB (ref 12.0–15.0)

## 2017-06-29 LAB — FERRITIN: FERRITIN: 964 ng/mL — AB (ref 11–307)

## 2017-06-29 MED ORDER — EPOETIN ALFA 10000 UNIT/ML IJ SOLN: 40000.0000 [IU] | INTRAMUSCULAR | Status: DC

## 2017-06-29 MED ORDER — EPOETIN ALFA 40000 UNIT/ML IJ SOLN
INTRAMUSCULAR | Status: AC
  Administered 2017-06-29: 13:00:00 40000 [IU]
  Filled 2017-06-29: qty 1

## 2017-06-30 LAB — PTH, INTACT AND CALCIUM
Calcium, Total (PTH): 9 mg/dL (ref 8.7–10.3)
PTH: 76 pg/mL — ABNORMAL HIGH (ref 15–65)

## 2017-07-04 ENCOUNTER — Encounter (HOSPITAL_BASED_OUTPATIENT_CLINIC_OR_DEPARTMENT_OTHER): Payer: Self-pay | Admitting: *Deleted

## 2017-07-04 ENCOUNTER — Emergency Department (HOSPITAL_BASED_OUTPATIENT_CLINIC_OR_DEPARTMENT_OTHER)
Admission: EM | Admit: 2017-07-04 | Discharge: 2017-07-05 | Disposition: A | Discharge status: Home / Self Care | Payer: Medicare Other | Attending: Emergency Medicine | Admitting: Emergency Medicine

## 2017-07-04 ENCOUNTER — Emergency Department (HOSPITAL_BASED_OUTPATIENT_CLINIC_OR_DEPARTMENT_OTHER): Payer: Medicare Other

## 2017-07-04 ENCOUNTER — Other Ambulatory Visit: Payer: Self-pay

## 2017-07-04 DIAGNOSIS — E039 Hypothyroidism, unspecified: Secondary | ICD-10-CM | POA: Diagnosis not present

## 2017-07-04 DIAGNOSIS — J111 Influenza due to unidentified influenza virus with other respiratory manifestations: Secondary | ICD-10-CM | POA: Diagnosis not present

## 2017-07-04 DIAGNOSIS — Z79899 Other long term (current) drug therapy: Secondary | ICD-10-CM | POA: Insufficient documentation

## 2017-07-04 DIAGNOSIS — I10 Essential (primary) hypertension: Secondary | ICD-10-CM | POA: Diagnosis not present

## 2017-07-04 DIAGNOSIS — R69 Illness, unspecified: Secondary | ICD-10-CM

## 2017-07-04 DIAGNOSIS — J208 Acute bronchitis due to other specified organisms: Secondary | ICD-10-CM | POA: Insufficient documentation

## 2017-07-04 DIAGNOSIS — Z7901 Long term (current) use of anticoagulants: Secondary | ICD-10-CM | POA: Diagnosis not present

## 2017-07-04 DIAGNOSIS — Z85528 Personal history of other malignant neoplasm of kidney: Secondary | ICD-10-CM | POA: Diagnosis not present

## 2017-07-04 DIAGNOSIS — R05 Cough: Secondary | ICD-10-CM | POA: Diagnosis not present

## 2017-07-04 DIAGNOSIS — M7531 Calcific tendinitis of right shoulder: Secondary | ICD-10-CM | POA: Diagnosis not present

## 2017-07-04 DIAGNOSIS — B9689 Other specified bacterial agents as the cause of diseases classified elsewhere: Secondary | ICD-10-CM | POA: Insufficient documentation

## 2017-07-04 DIAGNOSIS — R509 Fever, unspecified: Secondary | ICD-10-CM | POA: Diagnosis present

## 2017-07-04 MED ORDER — SODIUM CHLORIDE 0.9 % IV BOLUS (SEPSIS)
500.0000 mL | Freq: Once | INTRAVENOUS | Status: AC
  Administered 2017-07-05: 500 mL via INTRAVENOUS

## 2017-07-04 NOTE — ED Triage Notes (Signed)
Fever, cough with yellow sputum. Pain in her right arm since she fell 2 weeks ago. She had a negative xray after the fall.

## 2017-07-04 NOTE — ED Provider Notes (Signed)
[name] Provider Note: Amanda Spurling, MD, FACEP  CSN: [id number] MRN: [id number] ARRIVAL: 07/04/17 at 2034 ROOM: [place]  Fever   HISTORY OF PRESENT ILLNESS  07/04/17 11:38 PM [name] is a 77 y.o. female with a history of lupus status post kidney transplant in 2006.  She is here with about a 1 week history of cough with yellow sputum.  The cough persists and she has had a fever today which is why she came to the ED.  She has also had general malaise and weakness.  She has had a decreased appetite and is concerned she may be dehydrated.  She fell about 2 weeks ago and has had persistent dull pain in her right shoulder.  The pain is in her anterior humeral area radiating down the anterior aspect of her right upper arm.  She had an x-ray after the fall which showed no fracture.  She rates her pain in that shoulder as a 6 out of 10, worse with movement or palpation.  She has chronic prominence of the veins of her upper chest as a result of a dialysis catheter some years back.   Past Medical History:  Diagnosis Date  . Anemia    "when lupus flares up"  . Anxiety   . Arthritis    "in my joints"  . Atrial fibrillation ([place])   . CAP (community acquired pneumonia) 08/20/2014  . Crohn's disease ([place])   . DVT of leg (deep venous thrombosis) ([place]) 2012-8./13/2013   "have had one in each leg now"  . DVT of lower extremity, bilateral ([place])    "have had them in both legs; last one was on the RLE this year" (04/25/2012)  . GERD (gastroesophageal reflux disease)   . Gout 2016  . Hypertension   . Hypothyroidism   . Lower GI bleed 2012  . Numbness and tingling of foot    bilaterally  . OSA on CPAP   . Refusal of blood transfusion for reasons of conscience 12/20/2011   pt is Jehovah's Witness  . Renal cell carcinoma   . Renal disorder    S/P nephrectomy; dialysis; "working fine now" (12/20/11)  . SBO (small bowel obstruction) ([place]) 11/2016  .  SLE (systemic lupus erythematosus) ([place])     Past Surgical History:  Procedure Laterality Date  . [place]   "found sluggish bone marrow" (04/25/2012)  . BONE MARROW BIOPSY  1993; 1995  . BREAST EXCISIONAL BIOPSY     right  . CATARACT EXTRACTION W/ INTRAOCULAR LENS  IMPLANT, BILATERAL  2005-2010  . CESAREAN SECTION  1984  . CHOLECYSTECTOMY  1995  . COLECTOMY  1998   "removal of abscess" (04/25/2012)  . COLONOSCOPY N/A 08/30/2013   Procedure: COLONOSCOPY;  Surgeon: Winfield Cunas., MD;  Location: [facility];  Service: Endoscopy;  Laterality: N/A;  . COLOSTOMY  1998  . COLOSTOMY REVERSAL  1999   "6 months after placed" (04/25/2012)  . EXCISIONAL HEMORRHOIDECTOMY    . INSERTION OF DIALYSIS CATHETER  1988-2006   "peritoneal and hemodialysis; had multiple grafts and fistulas; last graft clotted off 2012"  . NEPHRECTOMY TRANSPLANTED ORGAN  2006   bilaterally  . PARATHYROID IMPLANT REMOVAL Left    removed parathyroid from neck and implanted in arm  . VENA CAVA FILTER PLACEMENT  2012    Family History  Problem Relation Age of Onset  . Heart disease Mother   . Hypertension Unknown  runs in family  . Sleep apnea Sister     Social History   Tobacco Use  . Smoking status: Never Smoker  . Smokeless tobacco: Never Used  Substance Use Topics  . Alcohol use: No  . Drug use: No    Prior to Admission medications   Medication Sig Start Date End Date Taking? Authorizing Provider  acetaminophen (TYLENOL) 325 MG tablet Take 650 mg by mouth every 6 (six) hours as needed for mild pain.    [provider]  allopurinol (ZYLOPRIM) 100 MG tablet Take 1 tablet (100 mg total) by mouth 2 (two) times daily. 07/18/16   Ann Held, DO  calcitRIOL (ROCALTROL) 0.25 MCG capsule Alternate 1 tablet qd then 2 tablets then next    [provider]  epoetin alfa (EPOGEN,PROCRIT) 28413 UNIT/ML injection Inject 40,000 Units into the skin every  21 ( twenty-one) days.     [provider]  feeding supplement, ENSURE ENLIVE, (ENSURE ENLIVE) LIQD Take 237 mLs by mouth 2 (two) times daily between meals. 08/26/14   Ahmed, Chesley Mires, MD  fluticasone (FLONASE) 50 MCG/ACT nasal spray Place 2 sprays into both nostrils daily. Patient taking differently: Place 2 sprays into both nostrils daily as needed for allergies.  09/25/15   Saguier, Percell Miller, PA-C  gabapentin (NEURONTIN) 100 MG capsule Take 2 capsules (200 mg total) by mouth at bedtime. 07/18/16   Ann Held, DO  levothyroxine (SYNTHROID, LEVOTHROID) 100 MCG tablet Take 1 tablet (100 mcg total) by mouth daily. 07/18/16   Carollee Herter, Yvonne R, DO  MAGNESIUM-OXIDE 400 (241.3 MG) MG tablet Take 400 mg by mouth 2 (two) times daily. 05/19/14   [provider]  metoprolol tartrate (LOPRESSOR) 50 MG tablet Take 50 mg by mouth 2 (two) times daily.    [provider]  mycophenolate (MYFORTIC) 180 MG EC tablet Take 360 mg by mouth 2 (two) times daily.      [provider]  NON FORMULARY 1 each by Other route See admin instructions. CPAP nightly    [provider]  NONFORMULARY OR COMPOUNDED ITEM Lipid , TSH----  DX hypothyroid, chronic kidney disease, stage 4 01/18/16   Ann Held, DO  NONFORMULARY OR COMPOUNDED ITEM Compression hose-- thigh high  20-30 mm hg  #1 as directed Dx-- varicose veins b/l with pain 01/18/16   Ann Held, DO  NONFORMULARY OR COMPOUNDED ITEM Cmp, lipid, tsh cbcd   - dx htn, hypothyroidism 12/05/16   Carollee Herter, Alferd Apa, DO  omeprazole (PRILOSEC) 20 MG capsule Take 20 mg by mouth daily. 07/20/16   [provider]  polyethylene glycol (MIRALAX / GLYCOLAX) packet Take 17 g by mouth daily.     [provider]  predniSONE (DELTASONE) 5 MG tablet Take 5 mg by mouth daily.     [provider]  tacrolimus (PROGRAF) 1 MG capsule Take 3 mg by mouth See admin instructions. Take 4 mg by mouth in the  morning and take 3 mg by mouth at noon    [provider]  warfarin (COUMADIN) 2.5 MG tablet Take as directed per [facility]    [provider]    Allergies Amoxicillin; Ampicillin; Ciprofloxacin; Erythromycin; Penicillins; Sulfa antibiotics; Sulfamethoxazole-trimethoprim; Tetracycline; and Labetalol   REVIEW OF SYSTEMS  Negative except as noted here or in the History of Present Illness.   PHYSICAL EXAMINATION  Initial Vital Signs Blood pressure 124/70, pulse 93, temperature 98.4 F (36.9 C), temperature source Oral,  resp. rate 16, height 5' 3"  (1.6 m), weight 64 kg (141 lb), last menstrual period 03/05/2014, SpO2 98 %.  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 Extremities: No deformity; full range of motion; pulses normal Neurologic: Awake, alert and oriented; motor function intact in all extremities and symmetric; no facial droop Skin: Warm and dry Psychiatric: Normal mood and affect   RESULTS  Summary of this visit's results, reviewed by myself:   EKG Interpretation  Date/Time:    Ventricular Rate:    PR Interval:    QRS Duration:   QT Interval:    QTC Calculation:   R Axis:     Text Interpretation:        Laboratory Studies: Results for orders placed or performed during the hospital encounter of 07/04/17 (from the past 24 hour(s))  CBC with Differential/Platelet     Status: Abnormal   Collection Time: 07/04/17 10:52 PM  Result Value Ref Range   WBC 13.9 (H) 4.0 - 10.5 K/uL   RBC 3.07 (L) 3.87 -  5.11 MIL/uL   Hemoglobin 9.1 (L) 12.0 - 15.0 g/dL   HCT 28.6 (L) 36.0 - 46.0 %   MCV 93.2 78.0 - 100.0 fL   MCH 29.6 26.0 - 34.0 pg   MCHC 31.8 30.0 - 36.0 g/dL   RDW 15.4 11.5 - 15.5 %   Platelets 249 150 - 400 K/uL   Neutrophils Relative % 69 %   Lymphocytes Relative 10 %   Monocytes Relative 20 %   Eosinophils Relative 1 %   Basophils Relative 0 %   Neutro Abs 9.6 (H) 1.7 - 7.7 K/uL   Lymphs Abs 1.4 0.7 - 4.0 K/uL   Monocytes Absolute 2.8 (H) 0.1 - 1.0 K/uL   Eosinophils Absolute 0.1 0.0 - 0.7 K/uL   Basophils Absolute 0.0 0.0 - 0.1 K/uL   RBC Morphology POLYCHROMASIA PRESENT   Basic metabolic panel     Status: Abnormal   Collection Time: 07/04/17 10:52 PM  Result Value Ref Range   Sodium 128 (L) 135 - 145 mmol/L   Potassium 5.3 (H) 3.5 - 5.1 mmol/L   Chloride 100 (L) 101 - 111 mmol/L   CO2 17 (L) 22 - 32 mmol/L   Glucose, Bld 106 (H) 65 - 99 mg/dL   BUN 46 (H) 6 - 20 mg/dL   Creatinine, Ser 2.12 (H) 0.44 - 1.00 mg/dL   Calcium 8.7 (L) 8.9 - 10.3 mg/dL   GFR calc non Af Amer 22 (L) >60 mL/min   GFR calc Af Amer 25 (L) >60 mL/min   Anion gap 11 5 - 15   Lactic Acid 0.81 mmol/L  Imaging Studies: Dg Chest 2 View  Result Date: 07/04/2017 CLINICAL DATA:  Cough and fever fall with right shoulder pain EXAM: CHEST  2 VIEW COMPARISON:  Shoulder x-ray 06/22/2016, chest x-ray 10/31/2015 FINDINGS: Surgical clips at the neck. Linear foci of atelectasis in the left mid to lower lung. No pleural effusion. No focal consolidation. Calcification along the right lung base unchanged. Stable borderline cardiomegaly. No pneumothorax. Calcific tendinitis at the right shoulder. Kyphosis of the spine with chronic wedging deformities. Partially visualized surgical changes and IVC  filter in the upper abdomen. Aortic atherosclerosis IMPRESSION: 1. Linear foci of scarring or atelectasis in the left mid to lower lung. 2. Negative for pleural effusion or pneumothorax Electronically Signed   By: Donavan Foil M.D.   On: 07/04/2017 23:12    ED COURSE  Nursing notes and initial vitals signs, including pulse oximetry, reviewed.  Vitals:   07/04/17 2046 07/04/17 2249 07/04/17 2253 07/04/17 2332  BP: (!) 126/54 132/70 132/70 124/70  Pulse: 100 95 90 93  Resp: 18  16 16   Temp: 99 F (37.2 C)  98.4 F (36.9 C) 98.4 F (36.9 C)  TempSrc:   Oral Oral  SpO2: 99% 99% 100% 98%  Weight:      Height:       2:18 AM Patient feeling better after 500 mL normal saline bolus.  She was also given Tylenol for her shoulder pain.  We wish to avoid NSAIDs given her kidney transplant status.  Because of her immune suppressed status we will start her on an antibiotic for possible early bacterial bronchitis.  She is allergic to multiple antibiotics but can take Keflex.  PROCEDURES    ED DIAGNOSES     ICD-10-CM   1. Acute bacterial bronchitis J20.8    B96.89   2. Influenza-like illness R69   3. Calcific tendinitis of right shoulder M75.31        Sherie Dobrowolski, Jenny Reichmann, MD 07/05/17 0221

## 2017-07-05 ENCOUNTER — Ambulatory Visit: Payer: Medicare Other | Admitting: Family Medicine

## 2017-07-05 DIAGNOSIS — J208 Acute bronchitis due to other specified organisms: Secondary | ICD-10-CM | POA: Diagnosis not present

## 2017-07-05 LAB — BASIC METABOLIC PANEL
Anion gap: 11 (ref 5–15)
BUN: 46 mg/dL — AB (ref 6–20)
CHLORIDE: 100 mmol/L — AB (ref 101–111)
CO2: 17 mmol/L — ABNORMAL LOW (ref 22–32)
CREATININE: 2.12 mg/dL — AB (ref 0.44–1.00)
Calcium: 8.7 mg/dL — ABNORMAL LOW (ref 8.9–10.3)
GFR calc Af Amer: 25 mL/min — ABNORMAL LOW (ref >60)
GFR calc non Af Amer: 22 mL/min — ABNORMAL LOW (ref >60)
Glucose, Bld: 106 mg/dL — ABNORMAL HIGH (ref 65–99)
Potassium: 5.3 mmol/L — ABNORMAL HIGH (ref 3.5–5.1)
SODIUM: 128 mmol/L — AB (ref 135–145)

## 2017-07-05 LAB — CBC WITH DIFFERENTIAL/PLATELET
BASOS PCT: 0 %
Basophils Absolute: 0 10*3/uL (ref 0.0–0.1)
EOS ABS: 0.1 10*3/uL (ref 0.0–0.7)
Eosinophils Relative: 1 %
HCT: 28.6 % — ABNORMAL LOW (ref 36.0–46.0)
HEMOGLOBIN: 9.1 g/dL — AB (ref 12.0–15.0)
Lymphocytes Relative: 10 %
Lymphs Abs: 1.4 10*3/uL (ref 0.7–4.0)
MCH: 29.6 pg (ref 26.0–34.0)
MCHC: 31.8 g/dL (ref 30.0–36.0)
MCV: 93.2 fL (ref 78.0–100.0)
MONO ABS: 2.8 10*3/uL — AB (ref 0.1–1.0)
Monocytes Relative: 20 %
NEUTROS ABS: 9.6 10*3/uL — AB (ref 1.7–7.7)
Neutrophils Relative %: 69 %
Platelets: 249 10*3/uL (ref 150–400)
RBC: 3.07 MIL/uL — ABNORMAL LOW (ref 3.87–5.11)
RDW: 15.4 % (ref 11.5–15.5)
WBC: 13.9 10*3/uL — ABNORMAL HIGH (ref 4.0–10.5)

## 2017-07-05 LAB — I-STAT CG4 LACTIC ACID, ED: LACTIC ACID, VENOUS: 0.81 mmol/L (ref 0.5–1.9)

## 2017-07-05 MED ORDER — CEPHALEXIN 250 MG PO CAPS
1000.0000 mg | ORAL_CAPSULE | Freq: Once | ORAL | Status: AC
  Administered 2017-07-05: 1000 mg via ORAL
  Filled 2017-07-05: qty 4

## 2017-07-05 MED ORDER — ACETAMINOPHEN 325 MG PO TABS
ORAL_TABLET | ORAL | Status: AC
  Administered 2017-07-05: 650 mg
  Filled 2017-07-05: qty 2

## 2017-07-05 MED ORDER — CEPHALEXIN 500 MG PO CAPS: 500.0000 mg | ORAL_CAPSULE | Freq: Four times a day (QID) | ORAL | 0 refills | Status: DC

## 2017-07-07 ENCOUNTER — Encounter: Payer: Self-pay | Admitting: Family Medicine

## 2017-07-07 ENCOUNTER — Ambulatory Visit (INDEPENDENT_AMBULATORY_CARE_PROVIDER_SITE_OTHER): Payer: Medicare Other | Admitting: Family Medicine

## 2017-07-07 ENCOUNTER — Ambulatory Visit: Payer: Self-pay

## 2017-07-07 VITALS — BP 180/82 | HR 75 | Ht 62.0 in | Wt 135.0 lb

## 2017-07-07 DIAGNOSIS — S4991XA Unspecified injury of right shoulder and upper arm, initial encounter: Secondary | ICD-10-CM | POA: Diagnosis not present

## 2017-07-07 DIAGNOSIS — M25511 Pain in right shoulder: Secondary | ICD-10-CM

## 2017-07-07 NOTE — Patient Instructions (Addendum)
I'm concerned you tore your rotator cuff but you also have an unusual mass superficial to this - it has more blood flow than a typical hematoma though with your history a hematoma is still the most likely explanation. We will go ahead with an MRI of your shoulder to further assess but without contrast given your kidney history. I will contact you with results and next steps. Use a sling if needed to help for support. Tylenol 514m 1-2 tabs three times a day as needed for pain. Consider the topical medicine we discussed at last visit for your shoulder as well (capsaicin, biofreeeze, salon pas).Right

## 2017-07-09 ENCOUNTER — Encounter: Payer: Self-pay | Admitting: Family Medicine

## 2017-07-09 NOTE — Progress Notes (Addendum)
PCP: Ann Held, DO  Subjective:   HPI: Patient is a 77 y.o. female here for right shoulder injury.  Patient reports on 2/13 she fell forward landing onto a car then down onto the ground onto her right shoulder. Immediate pain, couldn't move arm normally after this. + swelling. Taking tylenol. Pain level is 5/10 and sharp anterolaterally. Difficulty trying to lift arm and trying to do things like comb her hair. No skin changes, numbness.  Past Medical History:  Diagnosis Date  . Anemia    "when lupus flares up"  . Anxiety   . Arthritis    "in my joints"  . Atrial fibrillation ([place])   . CAP (community acquired pneumonia) 08/20/2014  . Crohn's disease ([place])   . DVT of leg (deep venous thrombosis) ([place]) 2012-8./13/2013   "have had one in each leg now"  . DVT of lower extremity, bilateral ([place])    "have had them in both legs; last one was on the RLE this year" (04/25/2012)  . GERD (gastroesophageal reflux disease)   . Gout 2016  . Hypertension   . Hypothyroidism   . Lower GI bleed 2012  . Numbness and tingling of foot    bilaterally  . OSA on CPAP   . Refusal of blood transfusion for reasons of conscience 12/20/2011   pt is Jehovah's Witness  . Renal cell carcinoma   . Renal disorder    S/P nephrectomy; dialysis; "working fine now" (12/20/11)  . SBO (small bowel obstruction) ([place]) 11/2016  . SLE (systemic lupus erythematosus) ([place])     Current Outpatient Medications on File Prior to Visit  Medication Sig Dispense Refill  . acetaminophen (TYLENOL) 325 MG tablet Take 650 mg by mouth every 6 (six) hours as needed for mild pain.    Marland Kitchen allopurinol (ZYLOPRIM) 100 MG tablet Take 1 tablet (100 mg total) by mouth 2 (two) times daily. 30 tablet 2  . calcitRIOL (ROCALTROL) 0.25 MCG capsule Alternate 1 tablet qd then 2 tablets then next    . cephALEXin (KEFLEX) 500 MG capsule Take 1 capsule (500 mg total) by mouth 4 (four) times daily. 28 capsule 0  . epoetin alfa  (EPOGEN,PROCRIT) 20355 UNIT/ML injection Inject 40,000 Units into the skin every 21 ( twenty-one) days.     . feeding supplement, ENSURE ENLIVE, (ENSURE ENLIVE) LIQD Take 237 mLs by mouth 2 (two) times daily between meals. 60 Bottle 12  . fluticasone (FLONASE) 50 MCG/ACT nasal spray Place 2 sprays into both nostrils daily. (Patient taking differently: Place 2 sprays into both nostrils daily as needed for allergies. ) 16 g 1  . gabapentin (NEURONTIN) 100 MG capsule Take 2 capsules (200 mg total) by mouth at bedtime.    Marland Kitchen levothyroxine (SYNTHROID, LEVOTHROID) 100 MCG tablet Take 1 tablet (100 mcg total) by mouth daily.    Marland Kitchen MAGNESIUM-OXIDE 400 (241.3 MG) MG tablet Take 400 mg by mouth 2 (two) times daily.  0  . metoprolol tartrate (LOPRESSOR) 50 MG tablet Take 50 mg by mouth 2 (two) times daily.    . mycophenolate (MYFORTIC) 180 MG EC tablet Take 360 mg by mouth 2 (two) times daily.      . NON FORMULARY 1 each by Other route See admin instructions. CPAP nightly    . NONFORMULARY OR COMPOUNDED ITEM Lipid , TSH----  DX hypothyroid, chronic kidney disease, stage 4 1 each 0  . NONFORMULARY OR COMPOUNDED ITEM Compression hose-- thigh high  20-30 mm hg  #1 as directed  Dx-- varicose veins b/l with pain 1 each 0  . NONFORMULARY OR COMPOUNDED ITEM Cmp, lipid, tsh cbcd   - dx htn, hypothyroidism 1 each 0  . omeprazole (PRILOSEC) 20 MG capsule Take 20 mg by mouth daily.    . polyethylene glycol (MIRALAX / GLYCOLAX) packet Take 17 g by mouth daily.     . predniSONE (DELTASONE) 5 MG tablet Take 5 mg by mouth daily.     . tacrolimus (PROGRAF) 1 MG capsule Take 3 mg by mouth See admin instructions. Take 4 mg by mouth in the morning and take 3 mg by mouth at noon    . warfarin (COUMADIN) 2.5 MG tablet Take as directed per [facility]     No current facility-administered medications on file prior to visit.     Past Surgical History:  Procedure Laterality Date  . [place]   "found  sluggish bone marrow" (04/25/2012)  . BONE MARROW BIOPSY  1993; 1995  . BREAST EXCISIONAL BIOPSY     right  . CATARACT EXTRACTION W/ INTRAOCULAR LENS  IMPLANT, BILATERAL  2005-2010  . CESAREAN SECTION  1984  . CHOLECYSTECTOMY  1995  . COLECTOMY  1998   "removal of abscess" (04/25/2012)  . COLONOSCOPY N/A 08/30/2013   Procedure: COLONOSCOPY;  Surgeon: Winfield Cunas., MD;  Location: [facility];  Service: Endoscopy;  Laterality: N/A;  . COLOSTOMY  1998  . COLOSTOMY REVERSAL  1999   "6 months after placed" (04/25/2012)  . EXCISIONAL HEMORRHOIDECTOMY    . INSERTION OF DIALYSIS CATHETER  1988-2006   "peritoneal and hemodialysis; had multiple grafts and fistulas; last graft clotted off 2012"  . NEPHRECTOMY TRANSPLANTED ORGAN  2006   bilaterally  . PARATHYROID IMPLANT REMOVAL Left    removed parathyroid from neck and implanted in arm  . VENA CAVA FILTER PLACEMENT  2012    Allergies  Allergen Reactions  . Amoxicillin Anaphylaxis  . Ampicillin Anaphylaxis  . Ciprofloxacin Swelling and Other (See Comments)    "of my throat"  . Erythromycin Anaphylaxis  . Penicillins Anaphylaxis    Has patient had a PCN reaction causing immediate rash, facial/tongue/throat swelling, SOB or lightheadedness with hypotension: Yes Has patient had a PCN reaction causing severe rash involving mucus membranes or skin necrosis: No Has patient had a PCN reaction that required hospitalization: Yes Has patient had a PCN reaction occurring within the last 10 years: Yes If all of the above answers are "NO", then may proceed with Cephalosporin use.  . Sulfa Antibiotics Itching  . Sulfamethoxazole-Trimethoprim Itching  . Tetracycline Anaphylaxis  . Labetalol Nausea And Vomiting    Social History   Socioeconomic History  . Marital status: Married    Spouse name: Not on file  . Number of children: 3  . Years of education: Not on file  . Highest education level: Not on file  Social Needs  . Financial  resource strain: Not on file  . Food insecurity - worry: Not on file  . Food insecurity - inability: Not on file  . Transportation needs - medical: Not on file  . Transportation needs - non-medical: Not on file  Occupational History  . 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 Smoking status: Never Smoker  . Smokeless tobacco: Never Used  Substance and Sexual Activity  . Alcohol use: No  . Drug use: No  . Sexual activity: Yes    Birth control/protection: Post-menopausal  Other Topics Concern  .  Not on file  Social History Narrative  . Not on file    Family History  Problem Relation Age of Onset  . Heart disease Mother   . Hypertension Unknown        runs in family  . Sleep apnea Sister     BP (!) 180/82   Pulse 75   Ht _0  (1.575 m)   Wt 135 lb (61.2 kg)   LMP 03/05/2014   BMI 24.69 kg/m   Review of Systems: See HPI above.     Objective:  Physical Exam:  Gen: NAD, comfortable in exam room  Right shoulder: No swelling, ecchymoses.  No gross deformity. TTP anterior, superior right shoulder. ROM limited to 30 degrees abduction and flexion, 30 ER, full IR. Negative Yergasons. Cannot position for empty can.  4/5 external rotation, 5/5 IR. NV intact distally.  Left shoulder: No swelling, ecchymoses.  No gross deformity. No TTP. FROM. Strength 5/5 with empty can and resisted internal/external rotation. NV intact distally.  MSK u/s Right shoulder:  Biceps tendon intact on long and trans views.  Large heterogeneous mass superficial to biceps tendon, deep to deltoid with anechoic and hypoechoic features - measures 5.2 x 5.9 x 1.6cm with areas of marked vascularity.  Subscapularis is hyperechoic likely from shadowing of overlying mass but anechoic area at insertion concerning for partial thickness tear.  Infraspinatus appears normal.  Supraspinatus imaging concerning for full thickness retracted tear.     Assessment & Plan:  1. Right  shoulder injury - performed and independently reviewed ultrasound - noted supraspinatus and subscapularis tears with former appearing to be full thickness.  She also has a large mass that has a lot of neovascularity and is heterogeneous, atypical appearing for a hematoma.  Will go ahead with MRI to further characterize.  Sling for support, tylenol, topical medication.    Addendum:  MRI reviewed and discussed with patient.  Unfortunately she has both a retracted supraspinatus tear (2.2cm) as well as advanced arthritis.  She reports her pain and swelling have improved since seeing [place] but, not surprisingly, motion has not improved.  We discussed with the amount of retraction and concurrent level of arthritis usually replacement is discussed instead of only a rotator cuff repair.  I advised we go ahead with consultation with orthopedics though to discuss these in more detail - will refer to Dr. Mardelle Matte.

## 2017-07-10 DIAGNOSIS — S4991XD Unspecified injury of right shoulder and upper arm, subsequent encounter: Secondary | ICD-10-CM | POA: Insufficient documentation

## 2017-07-10 NOTE — Assessment & Plan Note (Signed)
performed and independently reviewed ultrasound - noted supraspinatus and subscapularis tears with former appearing to be full thickness.  She also has a large mass that has a lot of neovascularity and is heterogeneous, atypical appearing for a hematoma.  Will go ahead with MRI to further characterize.  Sling for support, tylenol, topical medication.

## 2017-07-12 ENCOUNTER — Other Ambulatory Visit (HOSPITAL_COMMUNITY): Payer: Self-pay | Admitting: *Deleted

## 2017-07-13 ENCOUNTER — Encounter (HOSPITAL_COMMUNITY): Payer: Self-pay

## 2017-07-13 ENCOUNTER — Encounter (HOSPITAL_COMMUNITY)
Admission: RE | Admit: 2017-07-13 | Discharge: 2017-07-13 | Disposition: A | Discharge status: Home / Self Care | Payer: Medicare Other | Source: Ambulatory Visit | Attending: Nephrology | Admitting: Nephrology

## 2017-07-13 DIAGNOSIS — D631 Anemia in chronic kidney disease: Secondary | ICD-10-CM | POA: Insufficient documentation

## 2017-07-13 DIAGNOSIS — N183 Chronic kidney disease, stage 3 (moderate): Secondary | ICD-10-CM | POA: Insufficient documentation

## 2017-07-13 MED ORDER — FERUMOXYTOL INJECTION 510 MG/17 ML
510.0000 mg | Freq: Once | INTRAVENOUS | Status: AC
  Administered 2017-07-13: 510 mg via INTRAVENOUS
  Filled 2017-07-13: qty 17

## 2017-07-19 ENCOUNTER — Other Ambulatory Visit (HOSPITAL_COMMUNITY): Payer: Self-pay | Admitting: *Deleted

## 2017-07-20 ENCOUNTER — Ambulatory Visit (HOSPITAL_COMMUNITY)
Admission: RE | Admit: 2017-07-20 | Discharge: 2017-07-20 | Disposition: A | Discharge status: Home / Self Care | Payer: Medicare Other | Source: Ambulatory Visit | Attending: Nephrology | Admitting: Nephrology

## 2017-07-20 VITALS — BP 141/54 | HR 69 | Temp 98.3°F | Resp 20

## 2017-07-20 DIAGNOSIS — D631 Anemia in chronic kidney disease: Secondary | ICD-10-CM | POA: Diagnosis not present

## 2017-07-20 DIAGNOSIS — N189 Chronic kidney disease, unspecified: Secondary | ICD-10-CM | POA: Diagnosis not present

## 2017-07-20 DIAGNOSIS — N183 Chronic kidney disease, stage 3 unspecified: Secondary | ICD-10-CM

## 2017-07-20 LAB — PROTIME-INR
INR: 4.99 — AB
Prothrombin Time: 46 seconds — ABNORMAL HIGH (ref 11.4–15.2)

## 2017-07-20 LAB — POCT HEMOGLOBIN-HEMACUE: HEMOGLOBIN: 8.7 g/dL — AB (ref 12.0–15.0)

## 2017-07-20 MED ORDER — EPOETIN ALFA 10000 UNIT/ML IJ SOLN: 40000.0000 [IU] | INTRAMUSCULAR | Status: DC

## 2017-07-20 MED ORDER — EPOETIN ALFA 40000 UNIT/ML IJ SOLN
INTRAMUSCULAR | Status: AC
  Administered 2017-07-20: 40000 [IU] via SUBCUTANEOUS
  Filled 2017-07-20: qty 1

## 2017-07-20 NOTE — Progress Notes (Signed)
Called [facility] and reported critical inr of 4.99 to [place].  Pt had already left the hospital after lab called to report the result.

## 2017-07-21 ENCOUNTER — Telehealth: Payer: Self-pay | Admitting: *Deleted

## 2017-07-21 ENCOUNTER — Other Ambulatory Visit: Payer: Self-pay | Admitting: Pediatrics

## 2017-07-21 NOTE — Telephone Encounter (Signed)
Copied from [place]. Topic: Inquiry >> Jul 21, 2017  1:05 PM Oliver Pila B wrote: Reason for CRM: pt called and states that she has an MRI tomorrow and she was asked if she has a filter in her chest, pt is unsure and wanted to see if her pcp could check her records concerning her heart to find out, contact pt to advise and to get clarity

## 2017-07-21 NOTE — Telephone Encounter (Signed)
She has an IVC filter

## 2017-07-21 NOTE — Telephone Encounter (Signed)
Patient notified and she will let radiology.

## 2017-07-22 ENCOUNTER — Ambulatory Visit (HOSPITAL_BASED_OUTPATIENT_CLINIC_OR_DEPARTMENT_OTHER)
Admission: RE | Admit: 2017-07-22 | Discharge: 2017-07-22 | Disposition: A | Discharge status: Home / Self Care | Payer: Medicare Other | Source: Ambulatory Visit | Attending: Family Medicine | Admitting: Family Medicine

## 2017-07-22 DIAGNOSIS — M75101 Unspecified rotator cuff tear or rupture of right shoulder, not specified as traumatic: Secondary | ICD-10-CM | POA: Diagnosis not present

## 2017-07-22 DIAGNOSIS — M25511 Pain in right shoulder: Secondary | ICD-10-CM

## 2017-07-22 DIAGNOSIS — M19011 Primary osteoarthritis, right shoulder: Secondary | ICD-10-CM | POA: Insufficient documentation

## 2017-07-25 NOTE — Addendum Note (Signed)
Addended by: Sherrie George F on: 07/25/2017 02:28 PM   Modules accepted: Orders

## 2017-07-26 DIAGNOSIS — N189 Chronic kidney disease, unspecified: Secondary | ICD-10-CM | POA: Diagnosis not present

## 2017-07-27 DIAGNOSIS — Z7901 Long term (current) use of anticoagulants: Secondary | ICD-10-CM | POA: Diagnosis not present

## 2017-07-28 DIAGNOSIS — M19011 Primary osteoarthritis, right shoulder: Secondary | ICD-10-CM | POA: Diagnosis not present

## 2017-07-28 DIAGNOSIS — M1712 Unilateral primary osteoarthritis, left knee: Secondary | ICD-10-CM | POA: Diagnosis not present

## 2017-07-31 ENCOUNTER — Ambulatory Visit: Payer: Medicare Other | Admitting: Family Medicine

## 2017-07-31 ENCOUNTER — Encounter: Payer: Self-pay | Admitting: Family Medicine

## 2017-07-31 ENCOUNTER — Ambulatory Visit (INDEPENDENT_AMBULATORY_CARE_PROVIDER_SITE_OTHER): Payer: Medicare Other | Admitting: Family Medicine

## 2017-07-31 DIAGNOSIS — S4991XD Unspecified injury of right shoulder and upper arm, subsequent encounter: Secondary | ICD-10-CM

## 2017-07-31 DIAGNOSIS — G8929 Other chronic pain: Secondary | ICD-10-CM | POA: Diagnosis not present

## 2017-07-31 DIAGNOSIS — M25562 Pain in left knee: Secondary | ICD-10-CM | POA: Diagnosis not present

## 2017-07-31 NOTE — Patient Instructions (Signed)
We will call you when this paperwork is completed (I should have it done and my note by tomorrow). If anything is different after I review Dr. Luanna Cole note I will also call you but your exam of your shoulder and your knees is much better. Take tylenol as needed for pain. Follow up with me as needed otherwise but this also may change depending on his notes.

## 2017-08-01 ENCOUNTER — Encounter: Payer: Self-pay | Admitting: Family Medicine

## 2017-08-01 DIAGNOSIS — M25562 Pain in left knee: Secondary | ICD-10-CM | POA: Insufficient documentation

## 2017-08-01 NOTE — Assessment & Plan Note (Signed)
2/2 arthritis.  S/p injection and mildly improved.  Tylenol as needed, will monitor.

## 2017-08-01 NOTE — Assessment & Plan Note (Signed)
patient with retracted supraspinatus tear, advanced arthritis, and effusion.  Has consulted with Dr. Mardelle Matte and discussed replacement surgery; repair not an option with amount of concurrent arthritis and retraction of the tear.  She has had improvement in her motion and pain subjectively improved though pain level is same as last visit.  Motion exercises, tylenol as needed.  Would monitor.  Physical therapy is an option though wouldn't expect her to have much progression of strength of supraspinatus with retraction/full thickness tear

## 2017-08-01 NOTE — Progress Notes (Signed)
PCP: Ann Held, DO  Subjective:   HPI: Patient is a 77 y.o. female here for right shoulder injury.  3/1: Patient reports on 2/13 she fell forward landing onto a car then down onto the ground onto her right shoulder. Immediate pain, couldn't move arm normally after this. + swelling. Taking tylenol. Pain level is 5/10 and sharp anterolaterally. Difficulty trying to lift arm and trying to do things like comb her hair. No skin changes, numbness.  3/25: Patient reports she's doing well. Saw Dr. Mardelle Matte and recommended conservative treatment. She had subacromial injection for right shoulder - left knee was also bothering her so she had intraarticular injection here. States pain today is 0/10 right knee, 5/10 in right shoulder and left knee, less sharp. Knee pain worse with ambulation. Shoulder has improved some, motion better than last visit here. She is taking tylenol as needed. No skin changes, numbness.  Past Medical History:  Diagnosis Date  . Anemia    "when lupus flares up"  . Anxiety   . Arthritis    "in my joints"  . Atrial fibrillation ([place])   . CAP (community acquired pneumonia) 08/20/2014  . Crohn's disease ([place])   . DVT of leg (deep venous thrombosis) ([place]) 2012-8./13/2013   "have had one in each leg now"  . DVT of lower extremity, bilateral ([place])    "have had them in both legs; last one was on the RLE this year" (04/25/2012)  . GERD (gastroesophageal reflux disease)   . Gout 2016  . Hypertension   . Hypothyroidism   . Lower GI bleed 2012  . Numbness and tingling of foot    bilaterally  . OSA on CPAP   . Refusal of blood transfusion for reasons of conscience 12/20/2011   pt is Jehovah's Witness  . Renal cell carcinoma   . Renal disorder    S/P nephrectomy; dialysis; "working fine now" (12/20/11)  . SBO (small bowel obstruction) ([place]) 11/2016  . SLE (systemic lupus erythematosus) ([place])     Current Outpatient Medications on File Prior to Visit   Medication Sig Dispense Refill  . acetaminophen (TYLENOL) 325 MG tablet Take 650 mg by mouth every 6 (six) hours as needed for mild pain.    Marland Kitchen allopurinol (ZYLOPRIM) 100 MG tablet Take 1 tablet (100 mg total) by mouth 2 (two) times daily. 30 tablet 2  . calcitRIOL (ROCALTROL) 0.25 MCG capsule Alternate 1 tablet qd then 2 tablets then next    . cephALEXin (KEFLEX) 500 MG capsule Take 1 capsule (500 mg total) by mouth 4 (four) times daily. 28 capsule 0  . epoetin alfa (EPOGEN,PROCRIT) 51761 UNIT/ML injection Inject 40,000 Units into the skin every 21 ( twenty-one) days.     . feeding supplement, ENSURE ENLIVE, (ENSURE ENLIVE) LIQD Take 237 mLs by mouth 2 (two) times daily between meals. 60 Bottle 12  . fluticasone (FLONASE) 50 MCG/ACT nasal spray Place 2 sprays into both nostrils daily. (Patient taking differently: Place 2 sprays into both nostrils daily as needed for allergies. ) 16 g 1  . gabapentin (NEURONTIN) 100 MG capsule Take 2 capsules (200 mg total) by mouth at bedtime.    Marland Kitchen levothyroxine (SYNTHROID, LEVOTHROID) 100 MCG tablet Take 1 tablet (100 mcg total) by mouth daily.    Marland Kitchen MAGNESIUM-OXIDE 400 (241.3 MG) MG tablet Take 400 mg by mouth 2 (two) times daily.  0  . metoprolol tartrate (LOPRESSOR) 50 MG tablet Take 50 mg by mouth 2 (two) times daily.    Marland Kitchen  mycophenolate (MYFORTIC) 180 MG EC tablet Take 360 mg by mouth 2 (two) times daily.      . NON FORMULARY 1 each by Other route See admin instructions. CPAP nightly    . NONFORMULARY OR COMPOUNDED ITEM Lipid , TSH----  DX hypothyroid, chronic kidney disease, stage 4 1 each 0  . NONFORMULARY OR COMPOUNDED ITEM Compression hose-- thigh high  20-30 mm hg  #1 as directed Dx-- varicose veins b/l with pain 1 each 0  . NONFORMULARY OR COMPOUNDED ITEM Cmp, lipid, tsh cbcd   - dx htn, hypothyroidism 1 each 0  . omeprazole (PRILOSEC) 20 MG capsule Take 20 mg by mouth daily.    . polyethylene glycol (MIRALAX / GLYCOLAX) packet Take 17 g by mouth  daily.     . predniSONE (DELTASONE) 5 MG tablet Take 5 mg by mouth daily.     . tacrolimus (PROGRAF) 1 MG capsule Take 3 mg by mouth See admin instructions. Take 4 mg by mouth in the morning and take 3 mg by mouth at noon    . warfarin (COUMADIN) 2.5 MG tablet Take as directed per [facility]     No current facility-administered medications on file prior to visit.     Past Surgical History:  Procedure Laterality Date  . [place]   "found sluggish bone marrow" (04/25/2012)  . BONE MARROW BIOPSY  1993; 1995  . BREAST EXCISIONAL BIOPSY     right  . CATARACT EXTRACTION W/ INTRAOCULAR LENS  IMPLANT, BILATERAL  2005-2010  . CESAREAN SECTION  1984  . CHOLECYSTECTOMY  1995  . COLECTOMY  1998   "removal of abscess" (04/25/2012)  . COLONOSCOPY N/A 08/30/2013   Procedure: COLONOSCOPY;  Surgeon: Winfield Cunas., MD;  Location: [facility];  Service: Endoscopy;  Laterality: N/A;  . COLOSTOMY  1998  . COLOSTOMY REVERSAL  1999   "6 months after placed" (04/25/2012)  . EXCISIONAL HEMORRHOIDECTOMY    . INSERTION OF DIALYSIS CATHETER  1988-2006   "peritoneal and hemodialysis; had multiple grafts and fistulas; last graft clotted off 2012"  . NEPHRECTOMY TRANSPLANTED ORGAN  2006   bilaterally  . PARATHYROID IMPLANT REMOVAL Left    removed parathyroid from neck and implanted in arm  . VENA CAVA FILTER PLACEMENT  2012    Allergies  Allergen Reactions  . Amoxicillin Anaphylaxis  . Ampicillin Anaphylaxis  . Ciprofloxacin Swelling and Other (See Comments)    "of my throat"  . Erythromycin Anaphylaxis  . Penicillins Anaphylaxis    Has patient had a PCN reaction causing immediate rash, facial/tongue/throat swelling, SOB or lightheadedness with hypotension: Yes Has patient had a PCN reaction causing severe rash involving mucus membranes or skin necrosis: No Has patient had a PCN reaction that required hospitalization: Yes Has patient had a PCN reaction occurring  within the last 10 years: Yes If all of the above answers are "NO", then may proceed with Cephalosporin use.  . Sulfa Antibiotics Itching  . Sulfamethoxazole-Trimethoprim Itching  . Tetracycline Anaphylaxis  . Labetalol Nausea And Vomiting    Social History   Socioeconomic History  . Marital status: Married    Spouse name: Not on file  . Number of children: 3  . Years of education: Not on file  . Highest education level: Not on file  Occupational History  . 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 Financial resource strain: Not on file  . Food insecurity:    Worry:  Not on file    Inability: Not on file  . Transportation needs:    Medical: Not on file    Non-medical: Not on file  Tobacco Use  . Smoking status: Never Smoker  . Smokeless tobacco: Never Used  Substance and Sexual Activity  . Alcohol use: No  . Drug use: No  . Sexual activity: Yes    Birth control/protection: Post-menopausal  Lifestyle  . Physical activity:    Days per week: Not on file    Minutes per session: Not on file  . Stress: Not on file  Relationships  . Social connections:    Talks on phone: Not on file    Gets together: Not on file    Attends religious service: Not on file    Active member of club or organization: Not on file    Attends meetings of clubs or organizations: Not on file    Relationship status: Not on file  . Intimate partner violence:    Fear of current or ex partner: Not on file    Emotionally abused: Not on file    Physically abused: Not on file    Forced sexual activity: Not on file  Other Topics Concern  . Not on file  Social History Narrative  . Not on file    Family History  Problem Relation Age of Onset  . Heart disease Mother   . Hypertension Unknown        runs in family  . Sleep apnea Sister     BP (!) 157/78   Pulse 65   Ht _0  (1.6 m)   Wt 135 lb (61.2 kg)   LMP 03/05/2014   BMI 23.91 kg/m   Review of Systems: See  HPI above.     Objective:  Physical Exam:  Gen: NAD, comfortable in exam room.  Right shoulder: No swelling, ecchymoses.  No gross deformity. TTP anterior shoulder.  No other tenderness. ROM limited to 50 degrees ER, 90 abduction and flexion but painful, improved. Positive Hawkins, Neers. Negative Yergasons. Strength 2/5 with empty can and 4/5 ER, 5/5 IR. NV intact distally.  Left knee: No gross deformity, ecchymoses, swelling. No TTP. FROM with 5/5 strength flexion/extension. Negative ant/post drawers. Negative valgus/varus testing. Negative lachmanns. Negative mcmurrays, apleys, patellar apprehension. NV intact distally.   Assessment & Plan:  1. Right shoulder injury - patient with retracted supraspinatus tear, advanced arthritis, and effusion.  Has consulted with Dr. Mardelle Matte and discussed replacement surgery; repair not an option with amount of concurrent arthritis and retraction of the tear.  She has had improvement in her motion and pain subjectively improved though pain level is same as last visit.  Motion exercises, tylenol as needed.  Would monitor.  Physical therapy is an option though wouldn't expect her to have much progression of strength of supraspinatus with retraction/full thickness tear  2. Left knee pain - 2/2 arthritis.  S/p injection and mildly improved.  Tylenol as needed, will monitor.

## 2017-08-03 ENCOUNTER — Ambulatory Visit (HOSPITAL_COMMUNITY)
Admission: RE | Admit: 2017-08-03 | Discharge: 2017-08-03 | Disposition: A | Discharge status: Home / Self Care | Payer: Medicare Other | Source: Ambulatory Visit | Attending: Nephrology | Admitting: Nephrology

## 2017-08-03 ENCOUNTER — Encounter (HOSPITAL_COMMUNITY): Payer: Self-pay

## 2017-08-03 VITALS — BP 112/60 | HR 70 | Temp 98.1°F | Resp 18

## 2017-08-03 DIAGNOSIS — Z5181 Encounter for therapeutic drug level monitoring: Secondary | ICD-10-CM | POA: Diagnosis not present

## 2017-08-03 DIAGNOSIS — Z79899 Other long term (current) drug therapy: Secondary | ICD-10-CM | POA: Insufficient documentation

## 2017-08-03 DIAGNOSIS — N183 Chronic kidney disease, stage 3 unspecified: Secondary | ICD-10-CM

## 2017-08-03 DIAGNOSIS — D631 Anemia in chronic kidney disease: Secondary | ICD-10-CM | POA: Insufficient documentation

## 2017-08-03 LAB — RENAL FUNCTION PANEL
Albumin: 3.8 g/dL (ref 3.5–5.0)
Anion gap: 10 (ref 5–15)
BUN: 54 mg/dL — ABNORMAL HIGH (ref 6–20)
CHLORIDE: 106 mmol/L (ref 101–111)
CO2: 20 mmol/L — AB (ref 22–32)
Calcium: 9.2 mg/dL (ref 8.9–10.3)
Creatinine, Ser: 1.98 mg/dL — ABNORMAL HIGH (ref 0.44–1.00)
GFR, EST AFRICAN AMERICAN: 27 mL/min — AB (ref >60)
GFR, EST NON AFRICAN AMERICAN: 24 mL/min — AB (ref >60)
Glucose, Bld: 116 mg/dL — ABNORMAL HIGH (ref 65–99)
POTASSIUM: 5 mmol/L (ref 3.5–5.1)
Phosphorus: 4.3 mg/dL (ref 2.5–4.6)
Sodium: 136 mmol/L (ref 135–145)

## 2017-08-03 LAB — POCT HEMOGLOBIN-HEMACUE: Hemoglobin: 10.7 g/dL — ABNORMAL LOW (ref 12.0–15.0)

## 2017-08-03 LAB — IRON AND TIBC
Iron: 87 ug/dL (ref 28–170)
Saturation Ratios: 44 % — ABNORMAL HIGH (ref 10.4–31.8)
TIBC: 196 ug/dL — AB (ref 250–450)
UIBC: 109 ug/dL

## 2017-08-03 LAB — FERRITIN: FERRITIN: 1481 ng/mL — AB (ref 11–307)

## 2017-08-03 MED ORDER — EPOETIN ALFA 10000 UNIT/ML IJ SOLN
40000.0000 [IU] | INTRAMUSCULAR | Status: DC
  Administered 2017-08-03: 13:00:00 40000 [IU] via SUBCUTANEOUS

## 2017-08-03 MED ORDER — EPOETIN ALFA 40000 UNIT/ML IJ SOLN
INTRAMUSCULAR | Status: AC
  Filled 2017-08-03: qty 1

## 2017-08-04 MED FILL — Epoetin Alfa Inj 40000 Unit/ML: INTRAMUSCULAR | Qty: 1 | Status: AC

## 2017-08-10 ENCOUNTER — Encounter (HOSPITAL_COMMUNITY): Payer: Medicare Other

## 2017-08-10 DIAGNOSIS — Z7901 Long term (current) use of anticoagulants: Secondary | ICD-10-CM | POA: Diagnosis not present

## 2017-08-17 ENCOUNTER — Other Ambulatory Visit: Payer: Self-pay | Admitting: Gastroenterology

## 2017-08-17 ENCOUNTER — Encounter (HOSPITAL_COMMUNITY): Payer: Medicare Other

## 2017-08-17 DIAGNOSIS — Z85528 Personal history of other malignant neoplasm of kidney: Secondary | ICD-10-CM | POA: Diagnosis not present

## 2017-08-17 DIAGNOSIS — Z7901 Long term (current) use of anticoagulants: Secondary | ICD-10-CM | POA: Diagnosis not present

## 2017-08-17 DIAGNOSIS — Z789 Other specified health status: Secondary | ICD-10-CM | POA: Diagnosis not present

## 2017-08-17 DIAGNOSIS — Z940 Kidney transplant status: Secondary | ICD-10-CM | POA: Diagnosis not present

## 2017-08-17 DIAGNOSIS — I480 Paroxysmal atrial fibrillation: Secondary | ICD-10-CM | POA: Diagnosis not present

## 2017-08-17 DIAGNOSIS — K625 Hemorrhage of anus and rectum: Secondary | ICD-10-CM | POA: Diagnosis not present

## 2017-08-17 DIAGNOSIS — K861 Other chronic pancreatitis: Secondary | ICD-10-CM | POA: Diagnosis not present

## 2017-08-17 DIAGNOSIS — K50919 Crohn's disease, unspecified, with unspecified complications: Secondary | ICD-10-CM | POA: Diagnosis not present

## 2017-08-22 DIAGNOSIS — N184 Chronic kidney disease, stage 4 (severe): Secondary | ICD-10-CM | POA: Diagnosis not present

## 2017-08-22 DIAGNOSIS — Z940 Kidney transplant status: Secondary | ICD-10-CM | POA: Diagnosis not present

## 2017-08-22 DIAGNOSIS — N2589 Other disorders resulting from impaired renal tubular function: Secondary | ICD-10-CM | POA: Diagnosis not present

## 2017-08-22 DIAGNOSIS — E782 Mixed hyperlipidemia: Secondary | ICD-10-CM | POA: Diagnosis not present

## 2017-08-22 DIAGNOSIS — M109 Gout, unspecified: Secondary | ICD-10-CM | POA: Diagnosis not present

## 2017-08-22 DIAGNOSIS — Z8719 Personal history of other diseases of the digestive system: Secondary | ICD-10-CM | POA: Diagnosis not present

## 2017-08-22 DIAGNOSIS — M329 Systemic lupus erythematosus, unspecified: Secondary | ICD-10-CM | POA: Diagnosis not present

## 2017-08-22 DIAGNOSIS — Z7901 Long term (current) use of anticoagulants: Secondary | ICD-10-CM | POA: Diagnosis not present

## 2017-08-22 DIAGNOSIS — I129 Hypertensive chronic kidney disease with stage 1 through stage 4 chronic kidney disease, or unspecified chronic kidney disease: Secondary | ICD-10-CM | POA: Diagnosis not present

## 2017-08-22 DIAGNOSIS — I4891 Unspecified atrial fibrillation: Secondary | ICD-10-CM | POA: Diagnosis not present

## 2017-08-22 DIAGNOSIS — D631 Anemia in chronic kidney disease: Secondary | ICD-10-CM | POA: Diagnosis not present

## 2017-08-22 DIAGNOSIS — N2581 Secondary hyperparathyroidism of renal origin: Secondary | ICD-10-CM | POA: Diagnosis not present

## 2017-08-29 DIAGNOSIS — Z961 Presence of intraocular lens: Secondary | ICD-10-CM | POA: Diagnosis not present

## 2017-08-29 DIAGNOSIS — H04123 Dry eye syndrome of bilateral lacrimal glands: Secondary | ICD-10-CM | POA: Diagnosis not present

## 2017-08-31 ENCOUNTER — Ambulatory Visit (HOSPITAL_COMMUNITY)
Admission: RE | Admit: 2017-08-31 | Discharge: 2017-08-31 | Disposition: A | Discharge status: Home / Self Care | Payer: Medicare Other | Source: Ambulatory Visit | Attending: Nephrology | Admitting: Nephrology

## 2017-08-31 VITALS — BP 154/72 | HR 70 | Temp 98.0°F | Resp 20

## 2017-08-31 DIAGNOSIS — D631 Anemia in chronic kidney disease: Secondary | ICD-10-CM | POA: Diagnosis not present

## 2017-08-31 DIAGNOSIS — N183 Chronic kidney disease, stage 3 unspecified: Secondary | ICD-10-CM

## 2017-08-31 LAB — RENAL FUNCTION PANEL
Albumin: 3.8 g/dL (ref 3.5–5.0)
Anion gap: 10 (ref 5–15)
BUN: 58 mg/dL — ABNORMAL HIGH (ref 6–20)
CHLORIDE: 106 mmol/L (ref 101–111)
CO2: 20 mmol/L — ABNORMAL LOW (ref 22–32)
CREATININE: 1.93 mg/dL — AB (ref 0.44–1.00)
Calcium: 9.2 mg/dL (ref 8.9–10.3)
GFR, EST AFRICAN AMERICAN: 28 mL/min — AB (ref >60)
GFR, EST NON AFRICAN AMERICAN: 24 mL/min — AB (ref >60)
Glucose, Bld: 99 mg/dL (ref 65–99)
POTASSIUM: 4.7 mmol/L (ref 3.5–5.1)
Phosphorus: 4 mg/dL (ref 2.5–4.6)
Sodium: 136 mmol/L (ref 135–145)

## 2017-08-31 LAB — IRON AND TIBC
Iron: 111 ug/dL (ref 28–170)
Saturation Ratios: 64 % — ABNORMAL HIGH (ref 10.4–31.8)
TIBC: 174 ug/dL — AB (ref 250–450)
UIBC: 63 ug/dL

## 2017-08-31 LAB — PROTIME-INR
INR: 2.73
PROTHROMBIN TIME: 28.7 s — AB (ref 11.4–15.2)

## 2017-08-31 LAB — POCT HEMOGLOBIN-HEMACUE: Hemoglobin: 11.2 g/dL — ABNORMAL LOW (ref 12.0–15.0)

## 2017-08-31 LAB — FERRITIN: Ferritin: 1231 ng/mL — ABNORMAL HIGH (ref 11–307)

## 2017-08-31 MED ORDER — EPOETIN ALFA 10000 UNIT/ML IJ SOLN: 40000.0000 [IU] | INTRAMUSCULAR | Status: DC

## 2017-08-31 MED ORDER — EPOETIN ALFA 40000 UNIT/ML IJ SOLN
INTRAMUSCULAR | Status: AC
  Administered 2017-08-31: 40000 [IU] via SUBCUTANEOUS
  Filled 2017-08-31: qty 1

## 2017-09-04 DIAGNOSIS — M19011 Primary osteoarthritis, right shoulder: Secondary | ICD-10-CM | POA: Diagnosis not present

## 2017-09-04 DIAGNOSIS — M1712 Unilateral primary osteoarthritis, left knee: Secondary | ICD-10-CM | POA: Diagnosis not present

## 2017-09-05 ENCOUNTER — Other Ambulatory Visit: Payer: Self-pay

## 2017-09-05 ENCOUNTER — Encounter (HOSPITAL_COMMUNITY): Payer: Self-pay | Admitting: *Deleted

## 2017-09-05 NOTE — Anesthesia Preprocedure Evaluation (Addendum)
Anesthesia Evaluation  Patient identified by MRN, date of birth, ID band Patient awake    Reviewed: Allergy & Precautions, NPO status , Patient's Chart, lab work & pertinent test results  Airway Mallampati: I       Dental  (+) Caps, Teeth Intact   Pulmonary sleep apnea and Continuous Positive Airway Pressure Ventilation ,    Pulmonary exam normal breath sounds clear to auscultation       Cardiovascular hypertension, Pt. on home beta blockers Normal cardiovascular exam Rhythm:Regular Rate:Normal     Neuro/Psych Anxiety negative neurological ROS     GI/Hepatic   Endo/Other  Hypothyroidism   Renal/GU   negative genitourinary   Musculoskeletal   Abdominal Normal abdominal exam  (+)   Peds  Hematology  (+) Blood dyscrasia, anemia ,   Anesthesia Other Findings   Reproductive/Obstetrics                            Anesthesia Physical Anesthesia Plan  ASA: III  Anesthesia Plan: MAC   Post-op Pain Management:    Induction:   PONV Risk Score and Plan: 2 and Ondansetron  Airway Management Planned: Natural Airway and Mask  Additional Equipment:   Intra-op Plan:   Post-operative Plan:   Informed Consent: I have reviewed the patients History and Physical, chart, labs and discussed the procedure including the risks, benefits and alternatives for the proposed anesthesia with the patient or authorized representative who has indicated his/her understanding and acceptance.   Dental advisory given  Plan Discussed with: CRNA  Anesthesia Plan Comments:        Anesthesia Quick Evaluation

## 2017-09-05 NOTE — Progress Notes (Signed)
Spoke with pt for pre-op call. Pt has hx of A-fib and DVT's. Pt is on Coumadin, was instructed to stop Coumadin on 09/02/17. No bridge was ordered per pt. States she's been told by Dr. Johnsie Cancel she has a small heart murmur that he is watching. Pt denies any recent chest pain. Pt is not diabetic. 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   Echo 09/30/15 - in [place] CXR 08/01/17 - in [place] EKG 11/22/16 - in [place] test 10/08/15 - in [place]

## 2017-09-06 ENCOUNTER — Ambulatory Visit (HOSPITAL_COMMUNITY): Payer: Medicare Other | Admitting: Anesthesiology

## 2017-09-06 ENCOUNTER — Encounter (HOSPITAL_COMMUNITY): Payer: Self-pay

## 2017-09-06 ENCOUNTER — Ambulatory Visit (HOSPITAL_COMMUNITY)
Admission: RE | Admit: 2017-09-06 | Discharge: 2017-09-06 | Disposition: A | Discharge status: Home / Self Care | Payer: Medicare Other | Source: Ambulatory Visit | Attending: Gastroenterology | Admitting: Gastroenterology

## 2017-09-06 ENCOUNTER — Other Ambulatory Visit: Payer: Self-pay

## 2017-09-06 ENCOUNTER — Encounter (HOSPITAL_COMMUNITY)
Admission: RE | Disposition: A | Discharge status: Home / Self Care | Payer: Self-pay | Source: Ambulatory Visit | Attending: Gastroenterology

## 2017-09-06 DIAGNOSIS — Z79899 Other long term (current) drug therapy: Secondary | ICD-10-CM | POA: Diagnosis not present

## 2017-09-06 DIAGNOSIS — G4733 Obstructive sleep apnea (adult) (pediatric): Secondary | ICD-10-CM | POA: Diagnosis not present

## 2017-09-06 DIAGNOSIS — Z7901 Long term (current) use of anticoagulants: Secondary | ICD-10-CM | POA: Diagnosis not present

## 2017-09-06 DIAGNOSIS — K5090 Crohn's disease, unspecified, without complications: Secondary | ICD-10-CM | POA: Insufficient documentation

## 2017-09-06 DIAGNOSIS — G4730 Sleep apnea, unspecified: Secondary | ICD-10-CM | POA: Insufficient documentation

## 2017-09-06 DIAGNOSIS — F419 Anxiety disorder, unspecified: Secondary | ICD-10-CM | POA: Insufficient documentation

## 2017-09-06 DIAGNOSIS — K5520 Angiodysplasia of colon without hemorrhage: Secondary | ICD-10-CM | POA: Diagnosis not present

## 2017-09-06 DIAGNOSIS — Z7951 Long term (current) use of inhaled steroids: Secondary | ICD-10-CM | POA: Diagnosis not present

## 2017-09-06 DIAGNOSIS — Z91010 Allergy to peanuts: Secondary | ICD-10-CM | POA: Insufficient documentation

## 2017-09-06 DIAGNOSIS — K624 Stenosis of anus and rectum: Secondary | ICD-10-CM | POA: Insufficient documentation

## 2017-09-06 DIAGNOSIS — K921 Melena: Secondary | ICD-10-CM | POA: Insufficient documentation

## 2017-09-06 DIAGNOSIS — Z85528 Personal history of other malignant neoplasm of kidney: Secondary | ICD-10-CM | POA: Insufficient documentation

## 2017-09-06 DIAGNOSIS — Z888 Allergy status to other drugs, medicaments and biological substances status: Secondary | ICD-10-CM | POA: Diagnosis not present

## 2017-09-06 DIAGNOSIS — Z87892 Personal history of anaphylaxis: Secondary | ICD-10-CM | POA: Insufficient documentation

## 2017-09-06 DIAGNOSIS — Z905 Acquired absence of kidney: Secondary | ICD-10-CM | POA: Insufficient documentation

## 2017-09-06 DIAGNOSIS — D500 Iron deficiency anemia secondary to blood loss (chronic): Secondary | ICD-10-CM | POA: Diagnosis not present

## 2017-09-06 DIAGNOSIS — I4891 Unspecified atrial fibrillation: Secondary | ICD-10-CM | POA: Diagnosis not present

## 2017-09-06 DIAGNOSIS — K219 Gastro-esophageal reflux disease without esophagitis: Secondary | ICD-10-CM | POA: Diagnosis not present

## 2017-09-06 DIAGNOSIS — Z86718 Personal history of other venous thrombosis and embolism: Secondary | ICD-10-CM | POA: Diagnosis not present

## 2017-09-06 DIAGNOSIS — K439 Ventral hernia without obstruction or gangrene: Secondary | ICD-10-CM | POA: Diagnosis not present

## 2017-09-06 DIAGNOSIS — E039 Hypothyroidism, unspecified: Secondary | ICD-10-CM | POA: Diagnosis not present

## 2017-09-06 DIAGNOSIS — M329 Systemic lupus erythematosus, unspecified: Secondary | ICD-10-CM | POA: Insufficient documentation

## 2017-09-06 DIAGNOSIS — I10 Essential (primary) hypertension: Secondary | ICD-10-CM | POA: Insufficient documentation

## 2017-09-06 DIAGNOSIS — K5730 Diverticulosis of large intestine without perforation or abscess without bleeding: Secondary | ICD-10-CM | POA: Diagnosis not present

## 2017-09-06 DIAGNOSIS — Z79890 Hormone replacement therapy (postmenopausal): Secondary | ICD-10-CM | POA: Insufficient documentation

## 2017-09-06 DIAGNOSIS — Z881 Allergy status to other antibiotic agents status: Secondary | ICD-10-CM | POA: Insufficient documentation

## 2017-09-06 DIAGNOSIS — Z882 Allergy status to sulfonamides status: Secondary | ICD-10-CM | POA: Insufficient documentation

## 2017-09-06 DIAGNOSIS — Z940 Kidney transplant status: Secondary | ICD-10-CM | POA: Insufficient documentation

## 2017-09-06 HISTORY — DX: Personal history of other diseases of the digestive system: Z87.19

## 2017-09-06 HISTORY — DX: Ventral hernia without obstruction or gangrene: K43.9

## 2017-09-06 HISTORY — PX: COLONOSCOPY WITH PROPOFOL: SHX5780

## 2017-09-06 HISTORY — DX: Personal history of urinary calculi: Z87.442

## 2017-09-06 SURGERY — COLONOSCOPY WITH PROPOFOL
Anesthesia: Monitor Anesthesia Care

## 2017-09-06 MED ORDER — PROPOFOL 500 MG/50ML IV EMUL
INTRAVENOUS | Status: DC | PRN
Start: 2017-09-06 — End: 2017-09-06
  Administered 2017-09-06: 75 ug/kg/min via INTRAVENOUS

## 2017-09-06 MED ORDER — LACTATED RINGERS IV SOLN
INTRAVENOUS | Status: DC
  Administered 2017-09-06: 08:00:00 via INTRAVENOUS

## 2017-09-06 MED ORDER — PROPOFOL 10 MG/ML IV BOLUS
INTRAVENOUS | Status: DC | PRN
  Administered 2017-09-06: 20 mg via INTRAVENOUS
  Administered 2017-09-06: 30 mg via INTRAVENOUS

## 2017-09-06 MED ORDER — SODIUM CHLORIDE 0.9 % IV SOLN: INTRAVENOUS | Status: DC

## 2017-09-06 SURGICAL SUPPLY — 21 items

## 2017-09-06 NOTE — Op Note (Signed)
[facility] Patient Name: [name] Procedure Date : 09/06/2017 MRN: [id number] Attending MD: Nancy Fetter Dr., MD Date of Birth: 09-25-1940 CSN: [id number] Age: 77 Admit Type: Outpatient Procedure:                Colonoscopy Indications:              Hematochezia, Iron deficiency anemia secondary to                            chronic blood loss. patient has a history of                            Crohn's disease, renal cancer status post                            nephrectomy and renal transplant, PAF with chronic                            anticoagulation, and is a Sales promotion account executive Witness. Providers:                Joyice Faster. Jatziry Wechter Dr., MD, Burtis Junes, RN, Charolette Child, Technician, Wyatt Haste Referring MD:             Dr Jeneen Rinks Deterding Medicines:                Propofol per Anesthesia Complications:            No immediate complications. Estimated Blood Loss:     Estimated blood loss: none. Procedure:                Pre-Anesthesia Assessment:                           - Prior to the procedure, a History and Physical                            was performed, and patient medications and                            allergies were reviewed. The patient's tolerance of                            previous anesthesia was also reviewed. The risks                            and benefits of the procedure and the sedation                            options and risks were discussed with the patient.                            All questions were answered, and informed consent  was obtained. Prior Anticoagulants: The patient has                            taken Coumadin (warfarin), last dose was 3 days                            prior to procedure. ASA Grade Assessment: III - A                            patient with severe systemic disease. After                            reviewing the risks and benefits, the patient was                            deemed in satisfactory condition to undergo the                            procedure.                           After obtaining informed consent, the colonoscope                            was passed under direct vision. Throughout the                            procedure, the patient's blood pressure, pulse, and                            oxygen saturations were monitored continuously. The                            EC-[id number] ([id number]) scope was introduced through                            the anus and advanced to the the cecum, identified                            by appendiceal orifice and ileocecal valve. The                            entire colon was examined. The ileocecal valve,                            appendiceal orifice, and rectum were photographed.                            The quality of the bowel preparation was good. The                            colonoscopy was technically difficult and complex  due to restricted mobility of the colon. There was                            a large ventral hernia just above the renal                            transplant requiring pressure throughout the whole                            procedure. Scope In: 8:29:05 AM Scope Out: 8:57:28 AM Scope Withdrawal Time: 0 hours 12 minutes 3 seconds  Total Procedure Duration: 0 hours 28 minutes 23 seconds  Findings:      The digital rectal exam findings include severe anal stenosis.      A single small angiodysplastic lesion without bleeding was found in the       ascending colon.      Normal mucosa was found in the entire colon. there was no sign of active       Crohn's disease      A few medium-mouthed diverticula were found in the sigmoid colon and       descending colon.      There is no endoscopic evidence of bleeding, mass, polyps or tumor in       the entire colon.      A benign-appearing, intrinsic severe stenosis was found at the  anus and       was traversed. even with propofol sedation the anal stenosis would only       allow passage of small finger.      The retroflexed view of the distal rectum and anal verge was normal and       showed no anal or rectal abnormalities. 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                           - A single non-bleeding colonic angiodysplastic                            lesion.                           - Normal mucosa in the entire examined colon.                           - Diverticulosis in the sigmoid colon and in the                            descending colon.                           - Stricture at the anus.                           - The distal rectum and anal verge are normal on  retroflexion view.                           - No specimens collected.                           - history of Crohn's disease Recommendation:           - Patient has a contact number available for                            emergencies. The signs and symptoms of potential                            delayed complications were discussed with the                            patient. Return to normal activities tomorrow.                            Written discharge instructions were provided to the                            patient.                           - Resume previous diet.                           - Continue present medications.                           - No repeat colonoscopy due to age.                           - Return to endoscopist in 6 months. Procedure Code(s):        --- Professional ---                           [phone], Colonoscopy, flexible; diagnostic, including                            collection of specimen(s) by brushing or washing,                            when performed (separate procedure) Diagnosis Code(s):        --- Professional ---                           K57.30, Diverticulosis of large  intestine without                            perforation or abscess without bleeding                           D50.0, Iron deficiency anemia secondary to blood  loss (chronic)                           K62.4, Stenosis of anus and rectum                           K92.1, Melena (includes Hematochezia)                           K55.20, Angiodysplasia of colon without hemorrhage CPT copyright 2017 American Medical Association. All rights reserved. The codes documented in this report are preliminary and upon coder review may  be revised to meet current compliance requirements. Nancy Fetter Dr., MD 09/06/2017 9:26:13 AM This report has been signed electronically. Number of Addenda: 0

## 2017-09-06 NOTE — Transfer of Care (Signed)
Immediate Anesthesia Transfer of Care Note  Patient: [name]  Procedure(s) Performed: COLONOSCOPY WITH PROPOFOL (N/A )  Patient Location: Endoscopy Unit  Anesthesia Type:MAC  Level of Consciousness: drowsy and patient cooperative  Airway & Oxygen Therapy: Patient Spontanous Breathing and Patient connected to nasal cannula oxygen  Post-op Assessment: Report given to RN, Post -op Vital signs reviewed and stable and Patient moving all extremities X 4  Post vital signs: Reviewed and stable  Last Vitals:  Vitals Value Taken Time  BP    Temp    Pulse    Resp    SpO2      Last Pain:  Vitals:   09/06/17 0719  TempSrc: Oral  PainSc: 0-No pain         Complications: No apparent anesthesia complications

## 2017-09-06 NOTE — Discharge Instructions (Signed)
YOU HAD AN ENDOSCOPIC PROCEDURE TODAY: Refer to the procedure report and other information in the discharge instructions given to you for any specific questions about what was found during the examination. If this information does not answer your questions, please call [facility] office at [phone] to clarify.   YOU SHOULD EXPECT: Some feelings of bloating in the abdomen. Passage of more gas than usual. Walking can help get rid of the air that was put into your GI tract during the procedure and reduce the bloating. If you had a lower endoscopy (such as a colonoscopy or flexible sigmoidoscopy) you may notice spotting of blood in your stool or on the toilet paper. Some abdominal soreness may be present for a day or two, also.  DIET: Your first meal following the procedure should be a light meal and then it is ok to progress to your normal diet. A half-sandwich or bowl of soup is an example of a good first meal. Heavy or fried foods are harder to digest and may make you feel nauseous or bloated. Drink plenty of fluids but you should avoid alcoholic beverages for 24 hours. If you had a esophageal dilation, please see attached instructions for diet.   ACTIVITY: Your care partner should take you home directly after the procedure. You should plan to take it easy, moving slowly for the rest of the day. You can resume normal activity the day after the procedure however YOU SHOULD NOT DRIVE, use power tools, machinery or perform tasks that involve climbing or major physical exertion for 24 hours (because of the sedation medicines used during the test).   SYMPTOMS TO REPORT IMMEDIATELY: A gastroenterologist can be reached at any hour. Please call [phone]  for any of the following symptoms:  Following lower endoscopy (colonoscopy, flexible sigmoidoscopy) Excessive amounts of blood in the stool  Significant tenderness, worsening of abdominal pains  Swelling of the abdomen that is new, acute  Fever of 100 or  higher    FOLLOW UP:  If any biopsies were taken you will be contacted by phone or by letter within the next 1-3 weeks. Call [phone]  if you have not heard about the biopsies in 3 weeks.  Please also call with any specific questions about appointments or follow up tests.

## 2017-09-06 NOTE — H&P (Signed)
Subjective:   Patient is a 77 y.o. female presents with rectal bleeding.  She has a very complicated history she has had renal cancer and had nephrectomy she has Crohn's disease with anal stenosis.  She is a Restaurant manager, fast food.  In addition to this she has A. fib and is anticoagulated.  Her Coumadin has been on hold.  She takes MiraLAX on a daily basis.  She has a very difficult colonoscopy due to hernia.  We are doing a colonoscopy/sigmoidoscopy.. Procedure including risks and benefits discussed in office.  Patient Active Problem List   Diagnosis Date Noted  . Left knee pain 08/01/2017  . Right shoulder injury, subsequent encounter 07/10/2017  . Right shoulder pain 06/22/2017  . Hyperlipidemia LDL goal <100 06/06/2017  . Chronic pain of right knee 06/06/2017  . Plantar fasciitis 06/06/2017  . Partial small bowel obstruction ([place]) 11/23/2016  . SBO (small bowel obstruction) ([place]) 08/06/2016  . Community acquired pneumonia   . Pleural effusion, left   . Streptococcal pneumonia ([place]) 08/20/2014  . GERD (gastroesophageal reflux disease) 08/20/2014  . SLE (systemic lupus erythematosus) ([place]) 08/20/2014  . OSA (obstructive sleep apnea) 08/20/2014  . Immunosuppressed status ([place]) 08/20/2014  . Sepsis ([place]) 08/20/2014  . Anemia of chronic disease 08/20/2014  . Syncope 08/20/2014  . History of DVT (deep vein thrombosis) 03/05/2014  . Allergic rhinitis 09/08/2013  . Aortic heart murmur 09/04/2013  . Crohn's disease ([place])   . Renal disorder   . Atrial fibrillation ([place]) 12/20/2011  . CKD (chronic kidney disease) stage 3, GFR 30-59 ml/min (H[facility]) 12/20/2011  . History of renal transplant 12/20/2011  . HTN (hypertension) 12/20/2011  . Hypothyroidism 12/20/2011  . Hypersomnia with sleep apnea, unspecified 09/12/2007   Past Medical History:  Diagnosis Date  . Abdominal wall hernia   . Anemia    "when lupus flares up"  . Anxiety   . Arthritis    "in my joints"  . Atrial fibrillation ([place])    . CAP (community acquired pneumonia) 08/20/2014  . Crohn's disease ([place])   . DVT of leg (deep venous thrombosis) ([place]) 2012-8./13/2013   "have had one in each leg now"  . DVT of lower extremity, bilateral ([place])    "have had them in both legs; last one was on the RLE this year" (04/25/2012)  . GERD (gastroesophageal reflux disease)   . Gout 2016  . History of hiatal hernia   . History of kidney stones   . Hypertension   . Hypothyroidism   . Lower GI bleed 2012  . Numbness and tingling of foot    bilaterally  . OSA on CPAP   . Refusal of blood transfusion for reasons of conscience 12/20/2011   pt is Jehovah's Witness  . Renal cell carcinoma   . Renal disorder    S/P nephrectomy; dialysis; "working fine now" (12/20/11)  . SBO (small bowel obstruction) ([place]) 11/2016  . SLE (systemic lupus erythematosus) ([place])     Past Surgical History:  Procedure Laterality Date  . [place]   "found sluggish bone marrow" (04/25/2012)  . BONE MARROW BIOPSY  1993; 1995  . BREAST EXCISIONAL BIOPSY     right  . CATARACT EXTRACTION W/ INTRAOCULAR LENS  IMPLANT, BILATERAL  2005-2010  . CESAREAN SECTION  1984  . CHOLECYSTECTOMY  1995  . COLECTOMY  1998   "removal of abscess" (04/25/2012)  . COLONOSCOPY N/A 08/30/2013   Procedure: COLONOSCOPY;  Surgeon: Winfield Cunas., MD;  Location: [facility];  Service: Endoscopy;  Laterality: N/A;  . COLOSTOMY  1998  . COLOSTOMY REVERSAL  1999   "6 months after placed" (04/25/2012)  . EXCISIONAL HEMORRHOIDECTOMY    . INSERTION OF DIALYSIS CATHETER  1988-2006   "peritoneal and hemodialysis; had multiple grafts and fistulas; last graft clotted off 2012"  . NEPHRECTOMY TRANSPLANTED ORGAN  2006   bilaterally  . PARATHYROID IMPLANT REMOVAL Left    removed parathyroid from neck and implanted in arm  . VENA CAVA FILTER PLACEMENT  2012    Medications Prior to Admission  Medication Sig Dispense Refill Last Dose  . acetaminophen  (TYLENOL) 325 MG tablet Take 650 mg by mouth every 6 (six) hours as needed for mild pain.   Past Week at Unknown time  . allopurinol (ZYLOPRIM) 100 MG tablet Take 1 tablet (100 mg total) by mouth 2 (two) times daily. 30 tablet 2 09/05/2017 at Unknown time  . calcitRIOL (ROCALTROL) 0.25 MCG capsule Take 0.25-0.5 mcg by mouth See admin instructions. Take 0.25 mcg by mouth daily on Monday, Wednesday, Friday and Sunday. Take 0.5 mcg by mouth daily on all other days   09/05/2017 at Unknown time  . epoetin alfa (EPOGEN,PROCRIT) 49449 UNIT/ML injection Inject 40,000 Units into the skin every 21 ( twenty-one) days.    Past Week at Unknown time  . gabapentin (NEURONTIN) 100 MG capsule Take 2 capsules (200 mg total) by mouth at bedtime.   09/05/2017 at Unknown time  . levothyroxine (SYNTHROID, LEVOTHROID) 100 MCG tablet Take 1 tablet (100 mcg total) by mouth daily.   09/06/2017 at Unknown time  . MAGNESIUM-OXIDE 400 (241.3 MG) MG tablet Take 400 mg by mouth 2 (two) times daily.  0 09/05/2017 at Unknown time  . metoprolol tartrate (LOPRESSOR) 50 MG tablet Take 50 mg by mouth 2 (two) times daily.   09/06/2017 at Unknown time  . mycophenolate (MYFORTIC) 180 MG EC tablet Take 360 mg by mouth 2 (two) times daily.     09/05/2017 at Unknown time  . omeprazole (PRILOSEC) 20 MG capsule Take 20 mg by mouth daily.   09/05/2017 at Unknown time  . Papaya CHEW Chew 3 each by mouth daily as needed (for indegestion).   Past Week at Unknown time  . polyethylene glycol (MIRALAX / GLYCOLAX) packet Take 17 g by mouth daily as needed for moderate constipation.    09/05/2017 at Unknown time  . Polyethylene Glycol 400 (BLINK TEARS) 0.25 % GEL Place 1 drop into both eyes 2 (two) times daily as needed (for dry eyes).   09/05/2017 at Unknown time  . predniSONE (DELTASONE) 5 MG tablet Take 5 mg by mouth daily.    09/05/2017 at Unknown time  . Probiotic Product (PROBIOTIC PO) Take 2 tablets by mouth daily.   09/05/2017 at Unknown time  . tacrolimus  (PROGRAF) 1 MG capsule Take 3 mg by mouth 2 (two) times daily.    09/05/2017 at Unknown time  . warfarin (COUMADIN) 2.5 MG tablet Take 2.5-5 mg by mouth See admin instructions. Take 5 mg by mouth daily on Monday, Wednesday, Friday and Sunday. Take 2.5 mg by mouth daily on all other days   09/02/2017 at Unknown time  . feeding supplement, ENSURE ENLIVE, (ENSURE ENLIVE) LIQD Take 237 mLs by mouth 2 (two) times daily between meals. (Patient not taking: Reported on 08/31/2017) 60 Bottle 12 Not Taking at Unknown time  . fluticasone (FLONASE) 50 MCG/ACT nasal spray Place 2 sprays into both nostrils daily. (Patient taking differently: Place  2 sprays into both nostrils daily as needed for allergies. ) 16 g 1 More than a month at Unknown time  . NONFORMULARY OR COMPOUNDED ITEM Lipid , TSH----  DX hypothyroid, chronic kidney disease, stage 4 (Patient not taking: Reported on 08/31/2017) 1 each 0 Not Taking at Unknown time  . NONFORMULARY OR COMPOUNDED ITEM Compression hose-- thigh high  20-30 mm hg  #1 as directed Dx-- varicose veins b/l with pain (Patient not taking: Reported on 08/31/2017) 1 each 0 Not Taking at Unknown time  . NONFORMULARY OR COMPOUNDED ITEM Cmp, lipid, tsh cbcd   - dx htn, hypothyroidism (Patient not taking: Reported on 08/31/2017) 1 each 0 Not Taking at Unknown time   Allergies  Allergen Reactions  . Amoxicillin Anaphylaxis  . Ampicillin Anaphylaxis  . Ciprofloxacin Swelling and Other (See Comments)    "of my throat"  . Erythromycin Anaphylaxis  . Penicillins Anaphylaxis and Other (See Comments)    Has patient had a PCN reaction causing immediate rash, facial/tongue/throat swelling, SOB or lightheadedness with hypotension: Yes Has patient had a PCN reaction causing severe rash involving mucus membranes or skin necrosis: No Has patient had a PCN reaction that required hospitalization: Yes Has patient had a PCN reaction occurring within the last 10 years: Yes If all of the above answers are  "NO", then may proceed with Cephalosporin use.  . Sulfa Antibiotics Itching  . Sulfamethoxazole-Trimethoprim Itching  . Tetracycline Anaphylaxis  . Labetalol Nausea And Vomiting    Social History   Tobacco Use  . Smoking status: Never Smoker  . Smokeless tobacco: Never Used  Substance Use Topics  . Alcohol use: No    Family History  Problem Relation Age of Onset  . Heart disease Mother   . Hypertension Unknown        runs in family  . Sleep apnea Sister      Objective:   Patient Vitals for the past 8 hrs:  BP Temp Temp src Pulse Resp SpO2 Height Weight  09/06/17 0719 (!) 203/76 97.8 F (36.6 C) Oral (!) 102 15 100 % 5' 3"  (1.6 m) 61.2 kg (135 lb)   No intake/output data recorded. No intake/output data recorded.   See MD Preop evaluation      Assessment:   1.  Rectal bleeding impression who is anticoagulated Crohn's disease. 2.  Multiple medical problems as noted above.  Plan:   We will proceed with colonoscopy with the ultrathin scope and get as far as we possibly can safely.  This is all been discussed in detail with the patient and her husband.

## 2017-09-06 NOTE — Progress Notes (Signed)
Pt's recovery BP is 201/76. Pre procedure BP was 203/76. Notified Dr. Jillyn Hidden. Okay to discharge patient because pt is around baseline BP.

## 2017-09-06 NOTE — Anesthesia Postprocedure Evaluation (Signed)
Anesthesia Post Note  Patient: THECLA FORGIONE  Procedure(s) Performed: COLONOSCOPY WITH PROPOFOL (N/A )     Patient location during evaluation: Endoscopy Anesthesia Type: MAC Level of consciousness: awake Pain management: pain level controlled Vital Signs Assessment: post-procedure vital signs reviewed and stable Respiratory status: spontaneous breathing Cardiovascular status: stable Postop Assessment: no apparent nausea or vomiting Anesthetic complications: no    Last Vitals:  Vitals:   09/06/17 0719 09/06/17 0903  BP: (!) 203/76 (!) 178/65  Pulse: (!) 102 64  Resp: 15 16  Temp: 36.6 C 36.5 C  SpO2: 100% 100%    Last Pain:  Vitals:   09/06/17 0903  TempSrc: Oral  PainSc: 0-No pain   Pain Goal:                 Hulbert Branscome JR,JOHN Avir Deruiter

## 2017-09-14 ENCOUNTER — Ambulatory Visit (HOSPITAL_COMMUNITY)
Admission: RE | Admit: 2017-09-14 | Discharge: 2017-09-14 | Disposition: A | Discharge status: Home / Self Care | Payer: Medicare Other | Source: Ambulatory Visit | Attending: Nephrology | Admitting: Nephrology

## 2017-09-14 VITALS — BP 169/76 | HR 72 | Temp 97.8°F | Resp 20

## 2017-09-14 DIAGNOSIS — N183 Chronic kidney disease, stage 3 unspecified: Secondary | ICD-10-CM

## 2017-09-14 DIAGNOSIS — D631 Anemia in chronic kidney disease: Secondary | ICD-10-CM | POA: Insufficient documentation

## 2017-09-14 LAB — POCT HEMOGLOBIN-HEMACUE: Hemoglobin: 11.9 g/dL — ABNORMAL LOW (ref 12.0–15.0)

## 2017-09-14 MED ORDER — EPOETIN ALFA 10000 UNIT/ML IJ SOLN: 40000.0000 [IU] | INTRAMUSCULAR | Status: DC

## 2017-09-14 MED ORDER — EPOETIN ALFA 40000 UNIT/ML IJ SOLN
INTRAMUSCULAR | Status: AC
  Administered 2017-09-14: 40000 [IU]
  Filled 2017-09-14: qty 1

## 2017-09-27 ENCOUNTER — Encounter (HOSPITAL_COMMUNITY): Payer: Medicare Other

## 2017-09-28 ENCOUNTER — Ambulatory Visit (HOSPITAL_COMMUNITY)
Admission: RE | Admit: 2017-09-28 | Discharge: 2017-09-28 | Disposition: A | Discharge status: Home / Self Care | Payer: Medicare Other | Source: Ambulatory Visit | Attending: Nephrology | Admitting: Nephrology

## 2017-09-28 VITALS — BP 153/80 | Resp 20

## 2017-09-28 DIAGNOSIS — N183 Chronic kidney disease, stage 3 unspecified: Secondary | ICD-10-CM

## 2017-09-28 DIAGNOSIS — Z79899 Other long term (current) drug therapy: Secondary | ICD-10-CM | POA: Insufficient documentation

## 2017-09-28 DIAGNOSIS — Z5181 Encounter for therapeutic drug level monitoring: Secondary | ICD-10-CM | POA: Diagnosis not present

## 2017-09-28 DIAGNOSIS — D631 Anemia in chronic kidney disease: Secondary | ICD-10-CM | POA: Insufficient documentation

## 2017-09-28 LAB — RENAL FUNCTION PANEL
Albumin: 3.8 g/dL (ref 3.5–5.0)
Anion gap: 8 (ref 5–15)
BUN: 38 mg/dL — ABNORMAL HIGH (ref 6–20)
CHLORIDE: 108 mmol/L (ref 101–111)
CO2: 22 mmol/L (ref 22–32)
CREATININE: 1.63 mg/dL — AB (ref 0.44–1.00)
Calcium: 9.1 mg/dL (ref 8.9–10.3)
GFR, EST AFRICAN AMERICAN: 35 mL/min — AB (ref >60)
GFR, EST NON AFRICAN AMERICAN: 30 mL/min — AB (ref >60)
Glucose, Bld: 91 mg/dL (ref 65–99)
POTASSIUM: 4.6 mmol/L (ref 3.5–5.1)
Phosphorus: 3.4 mg/dL (ref 2.5–4.6)
Sodium: 138 mmol/L (ref 135–145)

## 2017-09-28 LAB — IRON AND TIBC
IRON: 69 ug/dL (ref 28–170)
SATURATION RATIOS: 36 % — AB (ref 10.4–31.8)
TIBC: 190 ug/dL — ABNORMAL LOW (ref 250–450)
UIBC: 121 ug/dL

## 2017-09-28 LAB — PROTIME-INR
INR: 5.67
Prothrombin Time: 50.8 seconds — ABNORMAL HIGH (ref 11.4–15.2)

## 2017-09-28 LAB — POCT HEMOGLOBIN-HEMACUE: Hemoglobin: 12.4 g/dL (ref 12.0–15.0)

## 2017-09-28 LAB — FERRITIN: FERRITIN: 734 ng/mL — AB (ref 11–307)

## 2017-09-28 MED ORDER — EPOETIN ALFA 10000 UNIT/ML IJ SOLN: 40000.0000 [IU] | INTRAMUSCULAR | Status: DC

## 2017-10-12 ENCOUNTER — Ambulatory Visit (HOSPITAL_COMMUNITY)
Admission: RE | Admit: 2017-10-12 | Discharge: 2017-10-12 | Disposition: A | Discharge status: Home / Self Care | Payer: Medicare Other | Source: Ambulatory Visit | Attending: Nephrology | Admitting: Nephrology

## 2017-10-12 VITALS — BP 141/65 | HR 66 | Temp 98.8°F | Resp 20

## 2017-10-12 DIAGNOSIS — D631 Anemia in chronic kidney disease: Secondary | ICD-10-CM | POA: Diagnosis not present

## 2017-10-12 DIAGNOSIS — N183 Chronic kidney disease, stage 3 unspecified: Secondary | ICD-10-CM

## 2017-10-12 LAB — POCT HEMOGLOBIN-HEMACUE: Hemoglobin: 12 g/dL (ref 12.0–15.0)

## 2017-10-12 MED ORDER — EPOETIN ALFA 10000 UNIT/ML IJ SOLN
40000.0000 [IU] | INTRAMUSCULAR | Status: DC
Start: 2017-10-12 — End: 2017-10-13

## 2017-10-26 ENCOUNTER — Ambulatory Visit (HOSPITAL_COMMUNITY)
Admission: RE | Admit: 2017-10-26 | Discharge: 2017-10-26 | Disposition: A | Discharge status: Home / Self Care | Payer: Medicare Other | Source: Ambulatory Visit | Attending: Nephrology | Admitting: Nephrology

## 2017-10-26 VITALS — BP 148/66 | HR 68 | Temp 98.3°F | Resp 20

## 2017-10-26 DIAGNOSIS — N183 Chronic kidney disease, stage 3 unspecified: Secondary | ICD-10-CM

## 2017-10-26 DIAGNOSIS — Z7952 Long term (current) use of systemic steroids: Secondary | ICD-10-CM | POA: Insufficient documentation

## 2017-10-26 DIAGNOSIS — Z7901 Long term (current) use of anticoagulants: Secondary | ICD-10-CM | POA: Insufficient documentation

## 2017-10-26 DIAGNOSIS — D631 Anemia in chronic kidney disease: Secondary | ICD-10-CM | POA: Diagnosis not present

## 2017-10-26 DIAGNOSIS — Z79899 Other long term (current) drug therapy: Secondary | ICD-10-CM | POA: Insufficient documentation

## 2017-10-26 DIAGNOSIS — Z79890 Hormone replacement therapy (postmenopausal): Secondary | ICD-10-CM | POA: Insufficient documentation

## 2017-10-26 LAB — IRON AND TIBC
Iron: 90 ug/dL (ref 28–170)
Saturation Ratios: 47 % — ABNORMAL HIGH (ref 10.4–31.8)
TIBC: 190 ug/dL — AB (ref 250–450)
UIBC: 100 ug/dL

## 2017-10-26 LAB — RENAL FUNCTION PANEL
Albumin: 3.7 g/dL (ref 3.5–5.0)
Anion gap: 8 (ref 5–15)
BUN: 42 mg/dL — ABNORMAL HIGH (ref 6–20)
CALCIUM: 9.2 mg/dL (ref 8.9–10.3)
CHLORIDE: 107 mmol/L (ref 101–111)
CO2: 23 mmol/L (ref 22–32)
Creatinine, Ser: 1.79 mg/dL — ABNORMAL HIGH (ref 0.44–1.00)
GFR calc non Af Amer: 27 mL/min — ABNORMAL LOW (ref >60)
GFR, EST AFRICAN AMERICAN: 31 mL/min — AB (ref >60)
GLUCOSE: 114 mg/dL — AB (ref 65–99)
Phosphorus: 3.9 mg/dL (ref 2.5–4.6)
Potassium: 4.4 mmol/L (ref 3.5–5.1)
SODIUM: 138 mmol/L (ref 135–145)

## 2017-10-26 LAB — POCT HEMOGLOBIN-HEMACUE: Hemoglobin: 11.1 g/dL — ABNORMAL LOW (ref 12.0–15.0)

## 2017-10-26 LAB — PROTIME-INR
INR: 1.55
PROTHROMBIN TIME: 18.4 s — AB (ref 11.4–15.2)

## 2017-10-26 LAB — FERRITIN: FERRITIN: 948 ng/mL — AB (ref 11–307)

## 2017-10-26 MED ORDER — EPOETIN ALFA 10000 UNIT/ML IJ SOLN
40000.0000 [IU] | INTRAMUSCULAR | Status: DC
  Administered 2017-10-26: 40000 [IU] via SUBCUTANEOUS

## 2017-10-26 MED ORDER — EPOETIN ALFA 40000 UNIT/ML IJ SOLN
INTRAMUSCULAR | Status: AC
  Filled 2017-10-26: qty 1

## 2017-10-27 MED FILL — Epoetin Alfa Inj 40000 Unit/ML: INTRAMUSCULAR | Qty: 1 | Status: AC

## 2017-11-10 ENCOUNTER — Encounter (HOSPITAL_COMMUNITY): Payer: Medicare Other

## 2017-11-17 DIAGNOSIS — D631 Anemia in chronic kidney disease: Secondary | ICD-10-CM | POA: Diagnosis not present

## 2017-11-17 DIAGNOSIS — M109 Gout, unspecified: Secondary | ICD-10-CM | POA: Diagnosis not present

## 2017-11-17 DIAGNOSIS — Z940 Kidney transplant status: Secondary | ICD-10-CM | POA: Diagnosis not present

## 2017-11-17 DIAGNOSIS — Z7901 Long term (current) use of anticoagulants: Secondary | ICD-10-CM | POA: Diagnosis not present

## 2017-11-17 DIAGNOSIS — N184 Chronic kidney disease, stage 4 (severe): Secondary | ICD-10-CM | POA: Diagnosis not present

## 2017-11-17 DIAGNOSIS — Z8719 Personal history of other diseases of the digestive system: Secondary | ICD-10-CM | POA: Diagnosis not present

## 2017-11-17 DIAGNOSIS — I129 Hypertensive chronic kidney disease with stage 1 through stage 4 chronic kidney disease, or unspecified chronic kidney disease: Secondary | ICD-10-CM | POA: Diagnosis not present

## 2017-11-17 DIAGNOSIS — N2589 Other disorders resulting from impaired renal tubular function: Secondary | ICD-10-CM | POA: Diagnosis not present

## 2017-11-17 DIAGNOSIS — N2581 Secondary hyperparathyroidism of renal origin: Secondary | ICD-10-CM | POA: Diagnosis not present

## 2017-11-17 DIAGNOSIS — E782 Mixed hyperlipidemia: Secondary | ICD-10-CM | POA: Diagnosis not present

## 2017-11-17 DIAGNOSIS — K219 Gastro-esophageal reflux disease without esophagitis: Secondary | ICD-10-CM | POA: Diagnosis not present

## 2017-11-17 DIAGNOSIS — M329 Systemic lupus erythematosus, unspecified: Secondary | ICD-10-CM | POA: Diagnosis not present

## 2017-11-22 ENCOUNTER — Telehealth: Payer: Self-pay | Admitting: Medical

## 2017-11-22 ENCOUNTER — Encounter: Payer: Self-pay | Admitting: Medical

## 2017-11-22 ENCOUNTER — Ambulatory Visit (INDEPENDENT_AMBULATORY_CARE_PROVIDER_SITE_OTHER): Payer: Medicare Other | Admitting: Medical

## 2017-11-22 VITALS — BP 155/73 | HR 79 | Temp 98.3°F | Resp 16 | Ht 62.0 in | Wt 136.2 lb

## 2017-11-22 DIAGNOSIS — N3001 Acute cystitis with hematuria: Secondary | ICD-10-CM | POA: Diagnosis not present

## 2017-11-22 DIAGNOSIS — R944 Abnormal results of kidney function studies: Secondary | ICD-10-CM

## 2017-11-22 DIAGNOSIS — R319 Hematuria, unspecified: Secondary | ICD-10-CM

## 2017-11-22 LAB — POC URINALSYSI DIPSTICK (AUTOMATED)
Bilirubin, UA: NEGATIVE
Glucose, UA: NEGATIVE
Ketones, UA: NEGATIVE
NITRITE UA: POSITIVE
PH UA: 6 (ref 5.0–8.0)
PROTEIN UA: POSITIVE — AB
Spec Grav, UA: 1.015 (ref 1.010–1.025)
UROBILINOGEN UA: 0.2 U/dL

## 2017-11-22 MED ORDER — CEFTRIAXONE SODIUM 1 G IJ SOLR
1.0000 g | Freq: Once | INTRAMUSCULAR | Status: AC
  Administered 2017-11-22: 1 g via INTRAMUSCULAR

## 2017-11-22 MED ORDER — CEPHALEXIN 250 MG PO CAPS: 250.0000 mg | ORAL_CAPSULE | Freq: Three times a day (TID) | ORAL | 0 refills | Status: DC

## 2017-11-22 NOTE — Progress Notes (Signed)
Subjective:    Patient ID: [name], female    DOB: Feb 25, 1941, 77 y.o.   MRN: [id number]  HPI  Pt in with some uti type symptoms for approximate 2 days   Dysuria- yes Frequent urination-yes Hesitancy-no  Suprapubic pressure-yes Fever-no  chills-no Nausea-yes. Some yesterday. Vomiting-no CVA pain- no. History of UTI-yes. Gross hematuria-no  Pt had kidney transplant in 2006.  Pt has a lot of allergies to antibiotic. PCN, cipro, and sulfa.  Pt has had rocephin before and no reaction seen in epic. Also has had keflex as well with no reaction. Keflex as early as 06-2017.   Review of Systems  Constitutional: Negative for chills, fatigue and fever.  Respiratory: Negative for cough, chest tightness, shortness of breath and wheezing.   Cardiovascular: Negative for chest pain and palpitations.  Genitourinary: Positive for dysuria, frequency and urgency. Negative for difficulty urinating, flank pain, pelvic pain, vaginal discharge and vaginal pain.  Neurological: Negative for dizziness and facial asymmetry.  Hematological: Negative for adenopathy. Does not bruise/bleed easily.  Psychiatric/Behavioral: Negative for behavioral problems and confusion.   Past Medical History:  Diagnosis Date  . Abdominal wall hernia   . Anemia    "when lupus flares up"  . Anxiety   . Arthritis    "in my joints"  . Atrial fibrillation ([place])   . CAP (community acquired pneumonia) 08/20/2014  . Crohn's disease ([place])   . DVT of leg (deep venous thrombosis) ([place]) 2012-8./13/2013   "have had one in each leg now"  . DVT of lower extremity, bilateral ([place])    "have had them in both legs; last one was on the RLE this year" (04/25/2012)  . GERD (gastroesophageal reflux disease)   . Gout 2016  . History of hiatal hernia   . History of kidney stones   . Hypertension   . Hypothyroidism   . Lower GI bleed 2012  . Numbness and tingling of foot    bilaterally  . OSA on CPAP   . Refusal of  blood transfusion for reasons of conscience 12/20/2011   pt is Jehovah's Witness  . Renal cell carcinoma   . Renal disorder    S/P nephrectomy; dialysis; "working fine now" (12/20/11)  . SBO (small bowel obstruction) ([place]) 11/2016  . SLE (systemic lupus erythematosus) ([facility])      Social History   Socioeconomic History  . Marital status: Married    Spouse name: Not on file  . Number of children: 3  . Years of education: Not on file  . Highest education level: Not on file  Occupational History  . 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 Financial resource strain: Not on file  . Food insecurity:    Worry: Not on file    Inability: Not on file  . Transportation needs:    Medical: Not on file    Non-medical: Not on file  Tobacco Use  . Smoking status: Never Smoker  . Smokeless tobacco: Never Used  Substance and Sexual Activity  . Alcohol use: No  . Drug use: No  . Sexual activity: Yes    Birth control/protection: Post-menopausal  Lifestyle  . Physical activity:    Days per week: Not on file    Minutes per session: Not on file  . Stress: Not on file  Relationships  . Social connections:    Talks on phone: Not on file    Gets together: Not on file  Attends religious service: Not on file    Active member of club or organization: Not on file    Attends meetings of clubs or organizations: Not on file    Relationship status: Not on file  . Intimate partner violence:    Fear of current or ex partner: Not on file    Emotionally abused: Not on file    Physically abused: Not on file    Forced sexual activity: Not on file  Other Topics Concern  . Not on file  Social History Narrative  . Not on file    Past Surgical History:  Procedure Laterality Date  . [place]   "found sluggish bone marrow" (04/25/2012)  . BONE MARROW BIOPSY  1993; 1995  . BREAST EXCISIONAL BIOPSY     right  . CATARACT EXTRACTION W/  INTRAOCULAR LENS  IMPLANT, BILATERAL  2005-2010  . CESAREAN SECTION  1984  . CHOLECYSTECTOMY  1995  . COLECTOMY  1998   "removal of abscess" (04/25/2012)  . COLONOSCOPY N/A 08/30/2013   Procedure: COLONOSCOPY;  Surgeon: Winfield Cunas., MD;  Location: [facility];  Service: Endoscopy;  Laterality: N/A;  . COLONOSCOPY WITH PROPOFOL N/A 09/06/2017   Procedure: COLONOSCOPY WITH PROPOFOL;  Surgeon: Laurence Spates, MD;  Location: [facility];  Service: Endoscopy;  Laterality: N/A;  . COLOSTOMY  1998  . COLOSTOMY REVERSAL  1999   "6 months after placed" (04/25/2012)  . EXCISIONAL HEMORRHOIDECTOMY    . INSERTION OF DIALYSIS CATHETER  1988-2006   "peritoneal and hemodialysis; had multiple grafts and fistulas; last graft clotted off 2012"  . NEPHRECTOMY TRANSPLANTED ORGAN  2006   bilaterally  . PARATHYROID IMPLANT REMOVAL Left    removed parathyroid from neck and implanted in arm  . VENA CAVA FILTER PLACEMENT  2012    Family History  Problem Relation Age of Onset  . Heart disease Mother   . Hypertension Unknown        runs in family  . Sleep apnea Sister     Allergies  Allergen Reactions  . Amoxicillin Anaphylaxis  . Ampicillin Anaphylaxis  . Ciprofloxacin Swelling and Other (See Comments)    "of my throat"  . Erythromycin Anaphylaxis  . Penicillins Anaphylaxis and Other (See Comments)    Has patient had a PCN reaction causing immediate rash, facial/tongue/throat swelling, SOB or lightheadedness with hypotension: Yes Has patient had a PCN reaction causing severe rash involving mucus membranes or skin necrosis: No Has patient had a PCN reaction that required hospitalization: Yes Has patient had a PCN reaction occurring within the last 10 years: Yes If all of the above answers are "NO", then may proceed with Cephalosporin use.  . Sulfa Antibiotics Itching  . Sulfamethoxazole-Trimethoprim Itching  . Tetracycline Anaphylaxis  . Labetalol Nausea And Vomiting    Current  Outpatient Medications on File Prior to Visit  Medication Sig Dispense Refill  . acetaminophen (TYLENOL) 325 MG tablet Take 650 mg by mouth every 6 (six) hours as needed for mild pain.    Marland Kitchen allopurinol (ZYLOPRIM) 100 MG tablet Take 1 tablet (100 mg total) by mouth 2 (two) times daily. 30 tablet 2  . calcitRIOL (ROCALTROL) 0.25 MCG capsule Take 0.25-0.5 mcg by mouth See admin instructions. Take 0.25 mcg by mouth daily on Monday, Wednesday, Friday and Sunday. Take 0.5 mcg by mouth daily on all other days    . epoetin alfa (EPOGEN,PROCRIT) 47425 UNIT/ML injection Inject 40,000 Units into the skin every 21 ( twenty-one)  days.     . fluticasone (FLONASE) 50 MCG/ACT nasal spray Place 2 sprays into both nostrils daily. (Patient taking differently: Place 2 sprays into both nostrils daily as needed for allergies. ) 16 g 1  . gabapentin (NEURONTIN) 100 MG capsule Take 2 capsules (200 mg total) by mouth at bedtime.    Marland Kitchen levothyroxine (SYNTHROID, LEVOTHROID) 100 MCG tablet Take 1 tablet (100 mcg total) by mouth daily.    Marland Kitchen MAGNESIUM-OXIDE 400 (241.3 MG) MG tablet Take 400 mg by mouth 2 (two) times daily.  0  . metoprolol tartrate (LOPRESSOR) 50 MG tablet Take 50 mg by mouth 2 (two) times daily.    . mycophenolate (MYFORTIC) 180 MG EC tablet Take 360 mg by mouth 2 (two) times daily.      Marland Kitchen omeprazole (PRILOSEC) 20 MG capsule Take 20 mg by mouth daily.    . Papaya CHEW Chew 3 each by mouth daily as needed (for indegestion).    . polyethylene glycol (MIRALAX / GLYCOLAX) packet Take 17 g by mouth daily as needed for moderate constipation.     . Polyethylene Glycol 400 (BLINK TEARS) 0.25 % GEL Place 1 drop into both eyes 2 (two) times daily as needed (for dry eyes).    . predniSONE (DELTASONE) 5 MG tablet Take 5 mg by mouth daily.     . Probiotic Product (PROBIOTIC PO) Take 2 tablets by mouth daily.    . tacrolimus (PROGRAF) 1 MG capsule Take 3 mg by mouth 2 (two) times daily.     Marland Kitchen warfarin (COUMADIN) 2.5 MG  tablet Take 2.5-5 mg by mouth See admin instructions. Take 5 mg by mouth daily on Monday, Wednesday, Friday and Sunday. Take 2.5 mg by mouth daily on all other days     No current facility-administered medications on file prior to visit.     BP (!) 155/73   Pulse 79   Temp 98.3 F (36.8 C) (Oral)   Resp 16   Ht _0  (1.575 m)   Wt 136 lb 3.2 oz (61.8 kg)   LMP 03/05/2014   SpO2 98%   BMI 24.91 kg/m       Objective:   Physical Exam  General- No acute distress. Pleasant patient. Neck- Full range of motion, no jvd Lungs- Clear, even and unlabored. Heart- regular rate and rhythm. Neurologic- CNII- XII grossly intact. Abdomen- faint mid suprabpubic tenderness to palpation.(transplant kidney) Back- no cva tenderness.        Assessment & Plan:  For probable uti with transplant kidney history will give rocephin 1 gram im. Also tomorrow can start keflex 250 mg three times daily for 7 days.  Did discuss with pharmacist/confirm no renal dose adjustment needed with Rocephin.  Calculated Keflex dosing per creatinine clearance guidelines per pharmacist guidance.  Urine culture is pending.  If signs and symptoms persist or worsen despite the above measures please let us know.  Follow-up in 7 days or as needed.  But if you feel you need to be seen on Friday before the weekend please try to arrange appointment for follow-up.  Pt declines cmp today as she states lab was done last week when she saw nephrologist.

## 2017-11-22 NOTE — Addendum Note (Signed)
Addended by: Hinton Dyer on: 11/22/2017 01:08 PM   Modules accepted: Orders

## 2017-11-22 NOTE — Patient Instructions (Addendum)
For probable uti with transplant kidney history will give rocephin 1 gram im. Also tomorrow can start keflex 250 mg three times daily for 7 days.  Did discuss with pharmacist/confirm no renal dose adjustment needed with Rocephin.  Calculated Keflex dosing per creatinine clearance guidelines per pharmacist guidance.  Urine culture is pending.  If signs and symptoms persist or worsen despite the above measures please let us know.  Follow-up in 7 days or as needed.  But if you feel you need to be seen on Friday before the weekend please try to arrange appointment for follow-up.

## 2017-11-22 NOTE — Telephone Encounter (Signed)
Could not put future poct urine in? Editor, commissioning. So will ask Jasmine to put order in. Dx would be hematuria.

## 2017-11-23 NOTE — Addendum Note (Signed)
Addended by: Hinton Dyer on: 11/23/2017 08:25 AM   Modules accepted: Orders

## 2017-11-24 LAB — URINE CULTURE
MICRO NUMBER:: 90846867
SPECIMEN QUALITY: ADEQUATE

## 2017-11-27 DIAGNOSIS — M19011 Primary osteoarthritis, right shoulder: Secondary | ICD-10-CM | POA: Diagnosis not present

## 2017-11-27 DIAGNOSIS — M1712 Unilateral primary osteoarthritis, left knee: Secondary | ICD-10-CM | POA: Diagnosis not present

## 2017-12-04 ENCOUNTER — Encounter: Payer: Self-pay | Admitting: Family Medicine

## 2017-12-04 ENCOUNTER — Ambulatory Visit (INDEPENDENT_AMBULATORY_CARE_PROVIDER_SITE_OTHER): Payer: Medicare Other | Admitting: Family Medicine

## 2017-12-04 VITALS — BP 140/66 | HR 72 | Temp 98.3°F | Ht 62.0 in | Wt 137.0 lb

## 2017-12-04 DIAGNOSIS — Z940 Kidney transplant status: Secondary | ICD-10-CM | POA: Diagnosis not present

## 2017-12-04 DIAGNOSIS — I4891 Unspecified atrial fibrillation: Secondary | ICD-10-CM | POA: Diagnosis not present

## 2017-12-04 DIAGNOSIS — E039 Hypothyroidism, unspecified: Secondary | ICD-10-CM | POA: Diagnosis not present

## 2017-12-04 DIAGNOSIS — Z87440 Personal history of urinary (tract) infections: Secondary | ICD-10-CM | POA: Diagnosis not present

## 2017-12-04 DIAGNOSIS — I10 Essential (primary) hypertension: Secondary | ICD-10-CM | POA: Diagnosis not present

## 2017-12-04 DIAGNOSIS — N183 Chronic kidney disease, stage 3 unspecified: Secondary | ICD-10-CM

## 2017-12-04 DIAGNOSIS — E785 Hyperlipidemia, unspecified: Secondary | ICD-10-CM

## 2017-12-04 LAB — LIPID PANEL
CHOLESTEROL: 157 mg/dL (ref 0–200)
HDL: 44 mg/dL (ref >39.00)
LDL CALC: 74 mg/dL (ref 0–99)
NonHDL: 112.58
TRIGLYCERIDES: 191 mg/dL — AB (ref 0.0–149.0)
Total CHOL/HDL Ratio: 4
VLDL: 38.2 mg/dL (ref 0.0–40.0)

## 2017-12-04 LAB — COMPREHENSIVE METABOLIC PANEL
ALBUMIN: 3.9 g/dL (ref 3.5–5.2)
ALT: 11 U/L (ref 0–35)
AST: 13 U/L (ref 0–37)
Alkaline Phosphatase: 147 U/L — ABNORMAL HIGH (ref 39–117)
BUN: 61 mg/dL — ABNORMAL HIGH (ref 6–23)
CALCIUM: 9.3 mg/dL (ref 8.4–10.5)
CHLORIDE: 105 meq/L (ref 96–112)
CO2: 26 mEq/L (ref 19–32)
Creatinine, Ser: 2.13 mg/dL — ABNORMAL HIGH (ref 0.40–1.20)
GFR: 29.08 mL/min — ABNORMAL LOW (ref >60.00)
Glucose, Bld: 85 mg/dL (ref 70–99)
POTASSIUM: 4.5 meq/L (ref 3.5–5.1)
Sodium: 140 mEq/L (ref 135–145)
Total Bilirubin: 0.5 mg/dL (ref 0.2–1.2)
Total Protein: 6.5 g/dL (ref 6.0–8.3)

## 2017-12-04 LAB — POC URINALSYSI DIPSTICK (AUTOMATED)
Bilirubin, UA: NEGATIVE
Blood, UA: NEGATIVE
Glucose, UA: NEGATIVE
Ketones, UA: NEGATIVE
LEUKOCYTES UA: NEGATIVE
NITRITE UA: NEGATIVE
PROTEIN UA: NEGATIVE
Spec Grav, UA: 1.02 (ref 1.010–1.025)
UROBILINOGEN UA: 0.2 U/dL
pH, UA: 5 (ref 5.0–8.0)

## 2017-12-04 LAB — TSH: TSH: 0.49 u[IU]/mL (ref 0.35–4.50)

## 2017-12-04 NOTE — Assessment & Plan Note (Signed)
Per transplant team and nephrology

## 2017-12-04 NOTE — Assessment & Plan Note (Signed)
Dr Archie Balboa

## 2017-12-04 NOTE — Patient Instructions (Signed)

## 2017-12-04 NOTE — Assessment & Plan Note (Signed)
Tolerating statin, encouraged heart healthy diet, avoid trans fats, minimize simple carbs and saturated fats. Increase exercise as tolerated 

## 2017-12-04 NOTE — Assessment & Plan Note (Signed)
Per cardiology  on warfarin

## 2017-12-04 NOTE — Assessment & Plan Note (Signed)
Check labs con't synthroid

## 2017-12-04 NOTE — Progress Notes (Signed)
Patient ID: [name], female    DOB: 1941/02/04  Age: 77 y.o. MRN: [id number]    Subjective:  Subjective  HPI [name] presents for f/u ---- no complaints.     Review of Systems  Constitutional: Negative for appetite change, diaphoresis, fatigue and unexpected weight change.  Eyes: Negative for pain, redness and visual disturbance.  Respiratory: Negative for cough, chest tightness, shortness of breath and wheezing.   Cardiovascular: Negative for chest pain, palpitations and leg swelling.  Endocrine: Negative for cold intolerance, heat intolerance, polydipsia, polyphagia and polyuria.  Genitourinary: Negative for difficulty urinating, dysuria and frequency.  Neurological: Negative for dizziness, light-headedness, numbness and headaches.    History Past Medical History:  Diagnosis Date  . Abdominal wall hernia   . Anemia    "when lupus flares up"  . Anxiety   . Arthritis    "in my joints"  . Atrial fibrillation ([place])   . CAP (community acquired pneumonia) 08/20/2014  . Crohn's disease ([place])   . DVT of leg (deep venous thrombosis) ([place]) 2012-8./13/2013   "have had one in each leg now"  . DVT of lower extremity, bilateral ([place])    "have had them in both legs; last one was on the RLE this year" (04/25/2012)  . GERD (gastroesophageal reflux disease)   . Gout 2016  . History of hiatal hernia   . History of kidney stones   . Hypertension   . Hypothyroidism   . Lower GI bleed 2012  . Numbness and tingling of foot    bilaterally  . OSA on CPAP   . Refusal of blood transfusion for reasons of conscience 12/20/2011   pt is Jehovah's Witness  . Renal cell carcinoma   . Renal disorder    S/P nephrectomy; dialysis; "working fine now" (12/20/11)  . SBO (small bowel obstruction) ([place]) 11/2016  . SLE (systemic lupus erythematosus) ([place])     She has a past surgical history that includes Cesarean section (1984); Nephrectomy transplanted organ (2006); Colectomy (1998);  Excisional hemorrhoidectomy; Cataract extraction w/ intraocular lens  implant, bilateral (2005-2010); Vena cava filter placement (2012); Insertion of dialysis catheter ([id number]); Cholecystectomy (1995); Colostomy (1998); Colostomy reversal (1999); Bone marrow biopsy (1993; 1995); Abdominal exploration surgery (1995); Parathyroid implant removal (Left); Colonoscopy (N/A, 08/30/2013); Breast excisional biopsy; and Colonoscopy with propofol (N/A, 09/06/2017).   Her family history includes Heart disease in her mother; Hypertension in her unknown relative; Sleep apnea in her sister.She reports that she has never smoked. She has never used smokeless tobacco. She reports that she does not drink alcohol or use drugs.  Current Outpatient Medications on File Prior to Visit  Medication Sig Dispense Refill  . acetaminophen (TYLENOL) 325 MG tablet Take 650 mg by mouth every 6 (six) hours as needed for mild pain.    Marland Kitchen allopurinol (ZYLOPRIM) 100 MG tablet Take 1 tablet (100 mg total) by mouth 2 (two) times daily. 30 tablet 2  . calcitRIOL (ROCALTROL) 0.25 MCG capsule Take 0.25-0.5 mcg by mouth See admin instructions. Take 0.25 mcg by mouth daily on Monday, Wednesday, Friday and Sunday. Take 0.5 mcg by mouth daily on all other days    . epoetin alfa (EPOGEN,PROCRIT) 70488 UNIT/ML injection Inject 40,000 Units into the skin every 21 ( twenty-one) days.     . fluticasone (FLONASE) 50 MCG/ACT nasal spray Place 2 sprays into both nostrils daily. (Patient taking differently: Place 2 sprays into both nostrils daily as needed for allergies. ) 16 g 1  .  gabapentin (NEURONTIN) 100 MG capsule Take 2 capsules (200 mg total) by mouth at bedtime.    Marland Kitchen levothyroxine (SYNTHROID, LEVOTHROID) 100 MCG tablet Take 1 tablet (100 mcg total) by mouth daily.    Marland Kitchen MAGNESIUM-OXIDE 400 (241.3 MG) MG tablet Take 400 mg by mouth 2 (two) times daily.  0  . metoprolol tartrate (LOPRESSOR) 50 MG tablet Take 50 mg by mouth 2 (two) times daily.    .  mycophenolate (MYFORTIC) 180 MG EC tablet Take 360 mg by mouth 2 (two) times daily.      Marland Kitchen omeprazole (PRILOSEC) 20 MG capsule Take 20 mg by mouth daily.    . Papaya CHEW Chew 3 each by mouth daily as needed (for indegestion).    . polyethylene glycol (MIRALAX / GLYCOLAX) packet Take 17 g by mouth daily as needed for moderate constipation.     . Polyethylene Glycol 400 (BLINK TEARS) 0.25 % GEL Place 1 drop into both eyes 2 (two) times daily as needed (for dry eyes).    . predniSONE (DELTASONE) 5 MG tablet Take 5 mg by mouth daily.     . Probiotic Product (PROBIOTIC PO) Take 2 tablets by mouth daily.    . tacrolimus (PROGRAF) 1 MG capsule Take 3 mg by mouth 2 (two) times daily.     Marland Kitchen warfarin (COUMADIN) 2.5 MG tablet Take 2.5-5 mg by mouth See admin instructions. Take 5 mg by mouth daily on Monday, Wednesday, Friday and Sunday. Take 2.5 mg by mouth daily on all other days     No current facility-administered medications on file prior to visit.      Objective:  Objective  Physical Exam  Constitutional: She is oriented to person, place, and time. She appears well-developed and well-nourished.  HENT:  Head: Normocephalic and atraumatic.  Eyes: Conjunctivae and EOM are normal.  Neck: Normal range of motion. Neck supple. No JVD present. Carotid bruit is not present. No thyromegaly present.  Cardiovascular: Normal rate, regular rhythm and normal heart sounds.  No murmur heard. Pulmonary/Chest: Effort normal and breath sounds normal. No respiratory distress. She has no wheezes. She has no rales. She exhibits no tenderness.  Musculoskeletal: She exhibits no edema.  Neurological: She is alert and oriented to person, place, and time.  Psychiatric: She has a normal mood and affect. Her mood appears not anxious. She does not exhibit a depressed mood.  Nursing note and vitals reviewed.  BP 140/66 (BP Location: Right Arm, Patient Position: Sitting, Cuff Size: Normal)   Pulse 72   Temp 98.3 F (36.8  C) (Oral)   Ht 5' 2" (1.575 m)   Wt 137 lb (62.1 kg)   LMP 03/05/2014   SpO2 100%   BMI 25.06 kg/m  Wt Readings from Last 3 Encounters:  12/04/17 137 lb (62.1 kg)  11/22/17 136 lb 3.2 oz (61.8 kg)  09/06/17 135 lb (61.2 kg)     Lab Results  Component Value Date   WBC 13.9 (H) 07/04/2017   HGB 11.1 (L) 10/26/2017   HCT 28.6 (L) 07/04/2017   PLT 249 07/04/2017   GLUCOSE 114 (H) 10/26/2017   CHOL 153 06/06/2017   TRIG 119.0 06/06/2017   HDL 43.80 06/06/2017   LDLCALC 85 06/06/2017   ALT 8 06/06/2017   AST 14 06/06/2017   NA 138 10/26/2017   K 4.4 10/26/2017   CL 107 10/26/2017   CREATININE 1.79 (H) 10/26/2017   BUN 42 (H) 10/26/2017   CO2 23 10/26/2017   TSH 1.08  06/06/2017   INR 1.55 10/26/2017   HGBA1C 6.5 (H) 11/12/2014    No results found.   Assessment & Plan:  Plan  I have discontinued Cheyla Duchemin. Hendrie's cephALEXin. I am also having her maintain her calcitRIOL, polyethylene glycol, mycophenolate, predniSONE, epoetin alfa, tacrolimus, acetaminophen, MAGNESIUM-OXIDE, warfarin, fluticasone, allopurinol, gabapentin, levothyroxine, omeprazole, metoprolol tartrate, Probiotic Product (PROBIOTIC PO), Papaya, and Polyethylene Glycol 400.  No orders of the defined types were placed in this encounter.   Problem List Items Addressed This Visit      Unprioritized   Atrial fibrillation ([place]) (Chronic)    Per cardiology  on warfarin       CKD (chronic kidney disease) stage 3, GFR 30-59 ml/min (HCC)    Dr Archie Balboa       History of renal transplant (Chronic)    Per transplant team and nephrology      HTN (hypertension) (Chronic)    Well controlled, no changes to meds. Encouraged heart healthy diet such as the DASH diet and exercise as tolerated.       Relevant Orders   Lipid panel   Comprehensive metabolic panel   TSH   Hyperlipidemia LDL goal <100    Tolerating statin, encouraged heart healthy diet, avoid trans fats, minimize simple carbs and saturated fats.  Increase exercise as tolerated      Hypothyroidism    Check labs con't synthroid      Relevant Orders   TSH    Other Visit Diagnoses    Hyperlipidemia, unspecified hyperlipidemia type    -  Primary   Relevant Orders   Lipid panel   Comprehensive metabolic panel   TSH   History of UTI       Relevant Orders   POCT Urinalysis Dipstick (Automated)      Follow-up: Return in about 6 months (around 06/06/2018) for hyperlipidemia, hypertension.  Ann Held, DO

## 2017-12-04 NOTE — Assessment & Plan Note (Signed)
Well controlled, no changes to meds. Encouraged heart healthy diet such as the DASH diet and exercise as tolerated.  °

## 2017-12-07 DIAGNOSIS — I10 Essential (primary) hypertension: Secondary | ICD-10-CM

## 2017-12-07 DIAGNOSIS — E785 Hyperlipidemia, unspecified: Secondary | ICD-10-CM

## 2017-12-12 NOTE — Progress Notes (Signed)
Patient ID: [name], female   DOB: 07-26-1940, 77 y.o.   MRN: [id number]   76 y.o.  with HTN and PAF  Initially seen 09/2014 Previously seen by [facility] and most recently by [facility] [name].  Longstanding PAF and history of DVT On chronic coumadin. CRF with previous dialysis and transplant 2006 Baseline Cr 2.1 Was on low dose amiodarone but caused decreased DLCO and thyroid issue She has been maint NSR with no dyspnea chest pain or palpitations No echo's but patient indicates normal LV function. She uses [name] as primary.   TTE:  09/30/15 EF normal  Cavity size normal moderate AR personally reviewed  LA severely dilated   April 2018:  Hospitalized with SBO From adhesions , hernia and chrones UTI on ceftin now   Complains of leg pain bilateral worse when walking worse in left Knee sounds arthritic and has had injections    ROS: Denies fever, malais, weight loss, blurry vision, decreased visual acuity, cough, sputum, SOB, hemoptysis, pleuritic pain, palpitaitons, heartburn, abdominal pain, melena, lower extremity edema, claudication, or rash.  All other systems reviewed and negative  General: Affect appropriate Mildly cushingoid  HEENT: normal Neck supple with no adenopathy JVP normal no bruits no thyromegaly Lungs clear with no wheezing and good diaphragmatic motion Heart:  S1/S2 AR  murmur, no rub, gallop or click old dialysis graft LUE PMI normal Abdomen: benighn, BS positve, no tenderness, no AAA old dialysis graft left groin With lateral hernia on right  no bruit.  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 acetaminophen (TYLENOL) 325 MG tablet Take 650 mg by mouth every 6 (six) hours as needed for mild pain.    Marland Kitchen allopurinol (ZYLOPRIM) 100 MG tablet Take 1 tablet (100 mg total) by mouth 2 (two) times daily. 30 tablet 2   . calcitRIOL (ROCALTROL) 0.25 MCG capsule Take 0.25-0.5 mcg by mouth See admin instructions. Take 0.25 mcg by mouth daily on Monday, Wednesday, Friday and Sunday. Take 0.5 mcg by mouth daily on all other days    . epoetin alfa (EPOGEN,PROCRIT) 00867 UNIT/ML injection Inject 40,000 Units into the skin every 21 ( twenty-one) days.     Marland Kitchen gabapentin (NEURONTIN) 100 MG capsule Take 2 capsules (200 mg total) by mouth at bedtime.    Marland Kitchen levothyroxine (SYNTHROID, LEVOTHROID) 100 MCG tablet Take 1 tablet (100 mcg total) by mouth daily.    Marland Kitchen MAGNESIUM-OXIDE 400 (241.3 MG) MG tablet Take 400 mg by mouth 2 (two) times daily.  0  . Melatonin 5 MG CAPS Take by mouth as directed.    . metoprolol tartrate (LOPRESSOR) 50 MG tablet Take 50 mg by mouth 2 (two) times daily.    . mycophenolate (MYFORTIC) 180 MG EC tablet Take 360 mg by mouth 2 (two) times daily.      Marland Kitchen omeprazole (PRILOSEC) 20 MG capsule Take 20 mg by mouth daily.    . Papaya CHEW Chew 3 each by mouth daily as needed (for indegestion).    . polyethylene glycol (MIRALAX / GLYCOLAX) packet Take 17 g by mouth daily as needed for moderate constipation.     . Polyethylene Glycol 400 (BLINK TEARS) 0.25 % GEL Place 1 drop into both eyes 2 (two) times daily as needed (for dry eyes).    . predniSONE (DELTASONE) 5 MG tablet Take 5  mg by mouth daily.     . Probiotic Product (PROBIOTIC PO) Take 2 tablets by mouth daily.    . tacrolimus (PROGRAF) 1 MG capsule Take 3 mg by mouth 2 (two) times daily.     Marland Kitchen warfarin (COUMADIN) 2.5 MG tablet Take 2.5-5 mg by mouth See admin instructions. Take 5 mg by mouth daily on Monday, Wednesday, Friday and Sunday. Take 2.5 mg by mouth daily on all other days     No current facility-administered medications for this visit.     Allergies  Amoxicillin; Ampicillin; Ciprofloxacin; Erythromycin; Penicillins; Sulfa antibiotics; Sulfamethoxazole-trimethoprim; Tetracycline; and Labetalol  Electrocardiogram:  12/13/17 SR rate 68 RBBB PR  212   Assessment and Plan AR:  Moderate no change in murmur f/u echo  Ordered  CRF:  F/U [name]  Baseline Cr mid 2's on prograf and deltasone Gout:  On allopurinol  Had pulse dose prednisone for Rx PAF: maint NSR  Amiodarone stopped due to low DLCO DVT:  History of 12/2011 extending from common femoral to calf veins  RBBB:  Chronic no change no high grade AV block yearly ECG  SBO:  Resolved on conservative Rx  Leg Pain: likely arthritis but pulses are hard to palpate f/u LE arterial duplex and ABI's   [name]

## 2017-12-13 ENCOUNTER — Encounter: Payer: Self-pay | Admitting: Cardiovascular Disease

## 2017-12-13 ENCOUNTER — Ambulatory Visit (INDEPENDENT_AMBULATORY_CARE_PROVIDER_SITE_OTHER): Payer: Medicare Other | Admitting: Cardiovascular Disease

## 2017-12-13 VITALS — BP 138/82 | HR 56 | Ht 62.0 in | Wt 130.8 lb

## 2017-12-13 DIAGNOSIS — I480 Paroxysmal atrial fibrillation: Secondary | ICD-10-CM

## 2017-12-13 DIAGNOSIS — I739 Peripheral vascular disease, unspecified: Secondary | ICD-10-CM

## 2017-12-13 DIAGNOSIS — I351 Nonrheumatic aortic (valve) insufficiency: Secondary | ICD-10-CM

## 2017-12-13 NOTE — Patient Instructions (Addendum)
Medication Instructions:  Your physician recommends that you continue on your current medications as directed. Please refer to the Current Medication list given to you today.  Labwork: NONE  Testing/Procedures: Your physician has requested that you have an echocardiogram. Echocardiography is a painless test that uses sound waves to create images of your heart. It provides your doctor with information about the size and shape of your heart and how well your heart's chambers and valves are working. This procedure takes approximately one hour. There are no restrictions for this procedure.  Your physician has requested that you have a lower extremity arterial duplex with ABI's. This test is an ultrasound of the arteries in the legs. It looks at arterial blood flow in the legs. Allow one hour for Upper Arterial scans. There are no restrictions or special instructions.  Follow-Up: Your physician wants you to follow-up in: 12 months with Dr. Johnsie Cancel. You will receive a reminder letter in the mail two months in advance. If you don't receive a letter, please call our office to schedule the follow-up appointment.   If you need a refill on your cardiac medications before your next appointment, please call your pharmacy.

## 2017-12-15 ENCOUNTER — Other Ambulatory Visit: Payer: Self-pay | Admitting: Cardiovascular Disease

## 2017-12-15 DIAGNOSIS — I739 Peripheral vascular disease, unspecified: Secondary | ICD-10-CM

## 2017-12-20 ENCOUNTER — Other Ambulatory Visit (HOSPITAL_COMMUNITY): Payer: Self-pay | Admitting: *Deleted

## 2017-12-21 ENCOUNTER — Ambulatory Visit (HOSPITAL_COMMUNITY)
Admission: RE | Admit: 2017-12-21 | Discharge: 2017-12-21 | Disposition: A | Discharge status: Home / Self Care | Payer: Medicare Other | Source: Ambulatory Visit | Attending: Nephrology | Admitting: Nephrology

## 2017-12-21 VITALS — BP 140/70 | HR 70 | Resp 16

## 2017-12-21 DIAGNOSIS — N183 Chronic kidney disease, stage 3 unspecified: Secondary | ICD-10-CM

## 2017-12-21 DIAGNOSIS — D631 Anemia in chronic kidney disease: Secondary | ICD-10-CM | POA: Insufficient documentation

## 2017-12-21 LAB — RENAL FUNCTION PANEL
ALBUMIN: 3.6 g/dL (ref 3.5–5.0)
ANION GAP: 7 (ref 5–15)
BUN: 39 mg/dL — ABNORMAL HIGH (ref 8–23)
CALCIUM: 9.3 mg/dL (ref 8.9–10.3)
CO2: 26 mmol/L (ref 22–32)
CREATININE: 2.06 mg/dL — AB (ref 0.44–1.00)
Chloride: 107 mmol/L (ref 98–111)
GFR calc Af Amer: 26 mL/min — ABNORMAL LOW (ref >60)
GFR calc non Af Amer: 23 mL/min — ABNORMAL LOW (ref >60)
Glucose, Bld: 96 mg/dL (ref 70–99)
PHOSPHORUS: 3.9 mg/dL (ref 2.5–4.6)
Potassium: 4.5 mmol/L (ref 3.5–5.1)
SODIUM: 140 mmol/L (ref 135–145)

## 2017-12-21 LAB — IRON AND TIBC
Iron: 114 ug/dL (ref 28–170)
SATURATION RATIOS: 58 % — AB (ref 10.4–31.8)
TIBC: 197 ug/dL — ABNORMAL LOW (ref 250–450)
UIBC: 83 ug/dL

## 2017-12-21 LAB — FERRITIN: Ferritin: 954 ng/mL — ABNORMAL HIGH (ref 11–307)

## 2017-12-21 LAB — POCT HEMOGLOBIN-HEMACUE: HEMOGLOBIN: 10.4 g/dL — AB (ref 12.0–15.0)

## 2017-12-21 LAB — PROTIME-INR
INR: 1.8
PROTHROMBIN TIME: 20.7 s — AB (ref 11.4–15.2)

## 2017-12-21 MED ORDER — EPOETIN ALFA 10000 UNIT/ML IJ SOLN: 40000.0000 [IU] | INTRAMUSCULAR | Status: DC

## 2017-12-21 MED ORDER — EPOETIN ALFA 40000 UNIT/ML IJ SOLN
INTRAMUSCULAR | Status: AC
  Administered 2017-12-21: 40000 [IU] via SUBCUTANEOUS
  Filled 2017-12-21: qty 1

## 2017-12-25 ENCOUNTER — Inpatient Hospital Stay (HOSPITAL_COMMUNITY): Admission: RE | Admit: 2017-12-25 | Payer: Medicare Other | Source: Ambulatory Visit

## 2017-12-25 ENCOUNTER — Other Ambulatory Visit: Payer: Self-pay

## 2017-12-25 ENCOUNTER — Ambulatory Visit (HOSPITAL_COMMUNITY): Payer: Medicare Other | Attending: Cardiology

## 2017-12-25 DIAGNOSIS — I082 Rheumatic disorders of both aortic and tricuspid valves: Secondary | ICD-10-CM | POA: Diagnosis not present

## 2017-12-25 DIAGNOSIS — I480 Paroxysmal atrial fibrillation: Secondary | ICD-10-CM

## 2017-12-25 DIAGNOSIS — I4891 Unspecified atrial fibrillation: Secondary | ICD-10-CM | POA: Diagnosis not present

## 2017-12-25 DIAGNOSIS — I351 Nonrheumatic aortic (valve) insufficiency: Secondary | ICD-10-CM | POA: Diagnosis not present

## 2017-12-25 DIAGNOSIS — I10 Essential (primary) hypertension: Secondary | ICD-10-CM | POA: Diagnosis not present

## 2017-12-26 ENCOUNTER — Telehealth: Payer: Self-pay

## 2017-12-26 DIAGNOSIS — I351 Nonrheumatic aortic (valve) insufficiency: Secondary | ICD-10-CM

## 2017-12-26 NOTE — Telephone Encounter (Signed)
Patient aware of echo results. Per Dr. Johnsie Cancel, EF normal stable moderate AR f/u echo in a year. Patient verbalized understanding.

## 2017-12-26 NOTE — Telephone Encounter (Signed)
-----   Message from Josue Hector, MD sent at 12/25/2017  9:43 PM EDT ----- EF normal stable moderate AR f/u echo in a year

## 2018-01-04 ENCOUNTER — Encounter (HOSPITAL_COMMUNITY): Payer: Medicare Other

## 2018-01-05 DIAGNOSIS — Z131 Encounter for screening for diabetes mellitus: Secondary | ICD-10-CM | POA: Diagnosis not present

## 2018-01-05 DIAGNOSIS — Z792 Long term (current) use of antibiotics: Secondary | ICD-10-CM | POA: Diagnosis not present

## 2018-01-05 DIAGNOSIS — D8989 Other specified disorders involving the immune mechanism, not elsewhere classified: Secondary | ICD-10-CM | POA: Diagnosis not present

## 2018-01-05 DIAGNOSIS — M3214 Glomerular disease in systemic lupus erythematosus: Secondary | ICD-10-CM | POA: Diagnosis not present

## 2018-01-05 DIAGNOSIS — Z8719 Personal history of other diseases of the digestive system: Secondary | ICD-10-CM | POA: Diagnosis not present

## 2018-01-05 DIAGNOSIS — G4733 Obstructive sleep apnea (adult) (pediatric): Secondary | ICD-10-CM | POA: Diagnosis not present

## 2018-01-05 DIAGNOSIS — Z86718 Personal history of other venous thrombosis and embolism: Secondary | ICD-10-CM | POA: Diagnosis not present

## 2018-01-05 DIAGNOSIS — Z7901 Long term (current) use of anticoagulants: Secondary | ICD-10-CM | POA: Diagnosis not present

## 2018-01-05 DIAGNOSIS — Z7952 Long term (current) use of systemic steroids: Secondary | ICD-10-CM | POA: Diagnosis not present

## 2018-01-05 DIAGNOSIS — Z8673 Personal history of transient ischemic attack (TIA), and cerebral infarction without residual deficits: Secondary | ICD-10-CM | POA: Diagnosis not present

## 2018-01-05 DIAGNOSIS — I4891 Unspecified atrial fibrillation: Secondary | ICD-10-CM | POA: Diagnosis not present

## 2018-01-05 DIAGNOSIS — Z9989 Dependence on other enabling machines and devices: Secondary | ICD-10-CM | POA: Diagnosis not present

## 2018-01-05 DIAGNOSIS — K5090 Crohn's disease, unspecified, without complications: Secondary | ICD-10-CM | POA: Diagnosis not present

## 2018-01-05 DIAGNOSIS — I120 Hypertensive chronic kidney disease with stage 5 chronic kidney disease or end stage renal disease: Secondary | ICD-10-CM | POA: Diagnosis not present

## 2018-01-05 DIAGNOSIS — Z79899 Other long term (current) drug therapy: Secondary | ICD-10-CM | POA: Diagnosis not present

## 2018-01-05 DIAGNOSIS — N186 End stage renal disease: Secondary | ICD-10-CM | POA: Diagnosis not present

## 2018-01-05 DIAGNOSIS — I10 Essential (primary) hypertension: Secondary | ICD-10-CM | POA: Diagnosis not present

## 2018-01-05 DIAGNOSIS — D631 Anemia in chronic kidney disease: Secondary | ICD-10-CM | POA: Diagnosis not present

## 2018-01-05 DIAGNOSIS — D649 Anemia, unspecified: Secondary | ICD-10-CM | POA: Diagnosis not present

## 2018-01-05 DIAGNOSIS — Z95828 Presence of other vascular implants and grafts: Secondary | ICD-10-CM | POA: Diagnosis not present

## 2018-01-05 DIAGNOSIS — Z4822 Encounter for aftercare following kidney transplant: Secondary | ICD-10-CM | POA: Diagnosis not present

## 2018-01-05 DIAGNOSIS — Z940 Kidney transplant status: Secondary | ICD-10-CM | POA: Diagnosis not present

## 2018-01-05 DIAGNOSIS — E785 Hyperlipidemia, unspecified: Secondary | ICD-10-CM | POA: Diagnosis not present

## 2018-01-05 DIAGNOSIS — K56609 Unspecified intestinal obstruction, unspecified as to partial versus complete obstruction: Secondary | ICD-10-CM | POA: Diagnosis not present

## 2018-01-11 ENCOUNTER — Ambulatory Visit (HOSPITAL_COMMUNITY)
Admission: RE | Admit: 2018-01-11 | Discharge: 2018-01-11 | Disposition: A | Discharge status: Home / Self Care | Payer: Medicare Other | Source: Ambulatory Visit | Attending: Nephrology | Admitting: Nephrology

## 2018-01-11 VITALS — BP 152/62 | HR 67 | Temp 97.6°F | Resp 20

## 2018-01-11 DIAGNOSIS — Z5181 Encounter for therapeutic drug level monitoring: Secondary | ICD-10-CM | POA: Diagnosis not present

## 2018-01-11 DIAGNOSIS — D631 Anemia in chronic kidney disease: Secondary | ICD-10-CM | POA: Diagnosis not present

## 2018-01-11 DIAGNOSIS — N183 Chronic kidney disease, stage 3 unspecified: Secondary | ICD-10-CM

## 2018-01-11 DIAGNOSIS — Z79899 Other long term (current) drug therapy: Secondary | ICD-10-CM | POA: Diagnosis not present

## 2018-01-11 LAB — POCT HEMOGLOBIN-HEMACUE: Hemoglobin: 10.4 g/dL — ABNORMAL LOW (ref 12.0–15.0)

## 2018-01-11 MED ORDER — EPOETIN ALFA 40000 UNIT/ML IJ SOLN
INTRAMUSCULAR | Status: AC
  Administered 2018-01-11: 40000 [IU]
  Filled 2018-01-11: qty 1

## 2018-01-11 MED ORDER — EPOETIN ALFA 10000 UNIT/ML IJ SOLN: 40000.0000 [IU] | INTRAMUSCULAR | Status: DC

## 2018-01-12 ENCOUNTER — Telehealth: Payer: Self-pay | Admitting: *Deleted

## 2018-01-12 NOTE — Telephone Encounter (Signed)
Received Medical records from [facility] Transplant; forwarded to provider/SLS 09/06

## 2018-01-17 ENCOUNTER — Other Ambulatory Visit: Payer: Self-pay | Admitting: Family Medicine

## 2018-01-17 DIAGNOSIS — Z1231 Encounter for screening mammogram for malignant neoplasm of breast: Secondary | ICD-10-CM

## 2018-01-18 ENCOUNTER — Encounter (HOSPITAL_COMMUNITY): Payer: Medicare Other

## 2018-01-25 ENCOUNTER — Encounter (HOSPITAL_COMMUNITY)
Admission: RE | Admit: 2018-01-25 | Discharge: 2018-01-25 | Disposition: A | Discharge status: Home / Self Care | Payer: Medicare Other | Source: Ambulatory Visit | Attending: Nephrology | Admitting: Nephrology

## 2018-01-25 VITALS — BP 158/73 | HR 66 | Temp 98.3°F | Resp 20

## 2018-01-25 DIAGNOSIS — D631 Anemia in chronic kidney disease: Secondary | ICD-10-CM | POA: Diagnosis not present

## 2018-01-25 DIAGNOSIS — N183 Chronic kidney disease, stage 3 unspecified: Secondary | ICD-10-CM

## 2018-01-25 LAB — RENAL FUNCTION PANEL
Albumin: 4 g/dL (ref 3.5–5.0)
Anion gap: 10 (ref 5–15)
BUN: 48 mg/dL — ABNORMAL HIGH (ref 8–23)
CO2: 25 mmol/L (ref 22–32)
Calcium: 9.5 mg/dL (ref 8.9–10.3)
Chloride: 102 mmol/L (ref 98–111)
Creatinine, Ser: 1.78 mg/dL — ABNORMAL HIGH (ref 0.44–1.00)
GFR calc Af Amer: 31 mL/min — ABNORMAL LOW (ref >60)
GFR calc non Af Amer: 27 mL/min — ABNORMAL LOW (ref >60)
Glucose, Bld: 84 mg/dL (ref 70–99)
Phosphorus: 4.1 mg/dL (ref 2.5–4.6)
Potassium: 4.4 mmol/L (ref 3.5–5.1)
Sodium: 137 mmol/L (ref 135–145)

## 2018-01-25 LAB — FERRITIN: Ferritin: 810 ng/mL — ABNORMAL HIGH (ref 11–307)

## 2018-01-25 LAB — IRON AND TIBC
IRON: 95 ug/dL (ref 28–170)
SATURATION RATIOS: 42 % — AB (ref 10.4–31.8)
TIBC: 228 ug/dL — AB (ref 250–450)
UIBC: 133 ug/dL

## 2018-01-25 LAB — POCT HEMOGLOBIN-HEMACUE: Hemoglobin: 11.5 g/dL — ABNORMAL LOW (ref 12.0–15.0)

## 2018-01-25 LAB — PROTIME-INR
INR: 2.77
PROTHROMBIN TIME: 29 s — AB (ref 11.4–15.2)

## 2018-01-25 MED ORDER — EPOETIN ALFA 10000 UNIT/ML IJ SOLN: 40000.0000 [IU] | INTRAMUSCULAR | Status: DC

## 2018-01-25 MED ORDER — EPOETIN ALFA 40000 UNIT/ML IJ SOLN
INTRAMUSCULAR | Status: AC
  Administered 2018-01-25: 40000 [IU] via SUBCUTANEOUS
  Filled 2018-01-25: qty 1

## 2018-01-26 DIAGNOSIS — H1133 Conjunctival hemorrhage, bilateral: Secondary | ICD-10-CM | POA: Diagnosis not present

## 2018-01-30 DIAGNOSIS — Z7901 Long term (current) use of anticoagulants: Secondary | ICD-10-CM | POA: Diagnosis not present

## 2018-02-08 ENCOUNTER — Ambulatory Visit (HOSPITAL_COMMUNITY)
Admission: RE | Admit: 2018-02-08 | Discharge: 2018-02-08 | Disposition: A | Discharge status: Home / Self Care | Payer: Medicare Other | Source: Ambulatory Visit | Attending: Nephrology | Admitting: Nephrology

## 2018-02-08 ENCOUNTER — Encounter (HOSPITAL_COMMUNITY): Payer: Self-pay

## 2018-02-08 VITALS — BP 151/70 | HR 75 | Temp 98.2°F | Resp 18

## 2018-02-08 DIAGNOSIS — D62 Acute posthemorrhagic anemia: Secondary | ICD-10-CM | POA: Diagnosis not present

## 2018-02-08 DIAGNOSIS — K5090 Crohn's disease, unspecified, without complications: Secondary | ICD-10-CM | POA: Diagnosis not present

## 2018-02-08 DIAGNOSIS — D631 Anemia in chronic kidney disease: Secondary | ICD-10-CM

## 2018-02-08 DIAGNOSIS — K625 Hemorrhage of anus and rectum: Secondary | ICD-10-CM | POA: Diagnosis not present

## 2018-02-08 DIAGNOSIS — E039 Hypothyroidism, unspecified: Secondary | ICD-10-CM | POA: Diagnosis not present

## 2018-02-08 DIAGNOSIS — I959 Hypotension, unspecified: Secondary | ICD-10-CM | POA: Diagnosis not present

## 2018-02-08 DIAGNOSIS — Z940 Kidney transplant status: Secondary | ICD-10-CM | POA: Diagnosis not present

## 2018-02-08 DIAGNOSIS — K922 Gastrointestinal hemorrhage, unspecified: Secondary | ICD-10-CM | POA: Diagnosis not present

## 2018-02-08 DIAGNOSIS — K5190 Ulcerative colitis, unspecified, without complications: Secondary | ICD-10-CM | POA: Diagnosis not present

## 2018-02-08 DIAGNOSIS — Z5181 Encounter for therapeutic drug level monitoring: Secondary | ICD-10-CM

## 2018-02-08 DIAGNOSIS — Z79899 Other long term (current) drug therapy: Secondary | ICD-10-CM

## 2018-02-08 DIAGNOSIS — N183 Chronic kidney disease, stage 3 unspecified: Secondary | ICD-10-CM

## 2018-02-08 DIAGNOSIS — N184 Chronic kidney disease, stage 4 (severe): Secondary | ICD-10-CM | POA: Diagnosis not present

## 2018-02-08 DIAGNOSIS — K5731 Diverticulosis of large intestine without perforation or abscess with bleeding: Secondary | ICD-10-CM | POA: Diagnosis not present

## 2018-02-08 LAB — POCT HEMOGLOBIN-HEMACUE: Hemoglobin: 11.8 g/dL — ABNORMAL LOW (ref 12.0–15.0)

## 2018-02-08 LAB — PROTIME-INR
INR: 1.44
PROTHROMBIN TIME: 17.4 s — AB (ref 11.4–15.2)

## 2018-02-08 MED ORDER — EPOETIN ALFA 10000 UNIT/ML IJ SOLN: 40000.0000 [IU] | INTRAMUSCULAR | Status: DC

## 2018-02-08 MED ORDER — EPOETIN ALFA 40000 UNIT/ML IJ SOLN
INTRAMUSCULAR | Status: AC
  Administered 2018-02-08: 40000 [IU]
  Filled 2018-02-08: qty 1

## 2018-02-10 ENCOUNTER — Inpatient Hospital Stay (HOSPITAL_BASED_OUTPATIENT_CLINIC_OR_DEPARTMENT_OTHER)
Admission: EM | Admit: 2018-02-10 | Discharge: 2018-02-12 | DRG: 378 | Disposition: A | Discharge status: Home / Self Care | Payer: Medicare Other | Attending: Internal Medicine | Admitting: Internal Medicine

## 2018-02-10 ENCOUNTER — Encounter (HOSPITAL_BASED_OUTPATIENT_CLINIC_OR_DEPARTMENT_OTHER): Payer: Self-pay | Admitting: Emergency Medicine

## 2018-02-10 ENCOUNTER — Emergency Department (HOSPITAL_BASED_OUTPATIENT_CLINIC_OR_DEPARTMENT_OTHER): Payer: Medicare Other

## 2018-02-10 ENCOUNTER — Other Ambulatory Visit: Payer: Self-pay

## 2018-02-10 DIAGNOSIS — Z882 Allergy status to sulfonamides status: Secondary | ICD-10-CM | POA: Diagnosis not present

## 2018-02-10 DIAGNOSIS — K5190 Ulcerative colitis, unspecified, without complications: Secondary | ICD-10-CM | POA: Diagnosis not present

## 2018-02-10 DIAGNOSIS — K219 Gastro-esophageal reflux disease without esophagitis: Secondary | ICD-10-CM | POA: Diagnosis present

## 2018-02-10 DIAGNOSIS — M329 Systemic lupus erythematosus, unspecified: Secondary | ICD-10-CM | POA: Diagnosis present

## 2018-02-10 DIAGNOSIS — R791 Abnormal coagulation profile: Secondary | ICD-10-CM | POA: Diagnosis present

## 2018-02-10 DIAGNOSIS — Z85528 Personal history of other malignant neoplasm of kidney: Secondary | ICD-10-CM

## 2018-02-10 DIAGNOSIS — I129 Hypertensive chronic kidney disease with stage 1 through stage 4 chronic kidney disease, or unspecified chronic kidney disease: Secondary | ICD-10-CM | POA: Diagnosis present

## 2018-02-10 DIAGNOSIS — K5790 Diverticulosis of intestine, part unspecified, without perforation or abscess without bleeding: Secondary | ICD-10-CM | POA: Diagnosis not present

## 2018-02-10 DIAGNOSIS — K921 Melena: Secondary | ICD-10-CM | POA: Diagnosis not present

## 2018-02-10 DIAGNOSIS — D62 Acute posthemorrhagic anemia: Secondary | ICD-10-CM | POA: Diagnosis present

## 2018-02-10 DIAGNOSIS — K5090 Crohn's disease, unspecified, without complications: Secondary | ICD-10-CM | POA: Diagnosis present

## 2018-02-10 DIAGNOSIS — Z940 Kidney transplant status: Secondary | ICD-10-CM

## 2018-02-10 DIAGNOSIS — Z905 Acquired absence of kidney: Secondary | ICD-10-CM

## 2018-02-10 DIAGNOSIS — M109 Gout, unspecified: Secondary | ICD-10-CM | POA: Diagnosis present

## 2018-02-10 DIAGNOSIS — I959 Hypotension, unspecified: Secondary | ICD-10-CM | POA: Diagnosis not present

## 2018-02-10 DIAGNOSIS — N183 Chronic kidney disease, stage 3 unspecified: Secondary | ICD-10-CM | POA: Diagnosis present

## 2018-02-10 DIAGNOSIS — I4891 Unspecified atrial fibrillation: Secondary | ICD-10-CM | POA: Diagnosis not present

## 2018-02-10 DIAGNOSIS — K922 Gastrointestinal hemorrhage, unspecified: Secondary | ICD-10-CM | POA: Diagnosis not present

## 2018-02-10 DIAGNOSIS — K5793 Diverticulitis of intestine, part unspecified, without perforation or abscess with bleeding: Secondary | ICD-10-CM | POA: Diagnosis not present

## 2018-02-10 DIAGNOSIS — N184 Chronic kidney disease, stage 4 (severe): Secondary | ICD-10-CM | POA: Diagnosis present

## 2018-02-10 DIAGNOSIS — E039 Hypothyroidism, unspecified: Secondary | ICD-10-CM | POA: Diagnosis present

## 2018-02-10 DIAGNOSIS — I82409 Acute embolism and thrombosis of unspecified deep veins of unspecified lower extremity: Secondary | ICD-10-CM | POA: Diagnosis present

## 2018-02-10 DIAGNOSIS — D500 Iron deficiency anemia secondary to blood loss (chronic): Secondary | ICD-10-CM | POA: Diagnosis not present

## 2018-02-10 DIAGNOSIS — K5731 Diverticulosis of large intestine without perforation or abscess with bleeding: Secondary | ICD-10-CM | POA: Diagnosis present

## 2018-02-10 DIAGNOSIS — G4710 Hypersomnia, unspecified: Secondary | ICD-10-CM | POA: Diagnosis present

## 2018-02-10 DIAGNOSIS — G4730 Sleep apnea, unspecified: Secondary | ICD-10-CM

## 2018-02-10 DIAGNOSIS — G4733 Obstructive sleep apnea (adult) (pediatric): Secondary | ICD-10-CM | POA: Diagnosis present

## 2018-02-10 DIAGNOSIS — I480 Paroxysmal atrial fibrillation: Secondary | ICD-10-CM | POA: Diagnosis present

## 2018-02-10 DIAGNOSIS — Z86718 Personal history of other venous thrombosis and embolism: Secondary | ICD-10-CM | POA: Diagnosis not present

## 2018-02-10 DIAGNOSIS — I10 Essential (primary) hypertension: Secondary | ICD-10-CM | POA: Diagnosis present

## 2018-02-10 DIAGNOSIS — Z8249 Family history of ischemic heart disease and other diseases of the circulatory system: Secondary | ICD-10-CM

## 2018-02-10 DIAGNOSIS — K50918 Crohn's disease, unspecified, with other complication: Secondary | ICD-10-CM | POA: Diagnosis not present

## 2018-02-10 DIAGNOSIS — Z7901 Long term (current) use of anticoagulants: Secondary | ICD-10-CM | POA: Diagnosis not present

## 2018-02-10 DIAGNOSIS — Z880 Allergy status to penicillin: Secondary | ICD-10-CM | POA: Diagnosis not present

## 2018-02-10 DIAGNOSIS — K625 Hemorrhage of anus and rectum: Secondary | ICD-10-CM | POA: Diagnosis not present

## 2018-02-10 LAB — PROTIME-INR
INR: 1.41
Prothrombin Time: 17.1 seconds — ABNORMAL HIGH (ref 11.4–15.2)

## 2018-02-10 LAB — CBC WITH DIFFERENTIAL/PLATELET
Basophils Absolute: 0 10*3/uL (ref 0.0–0.1)
Basophils Relative: 1 %
EOS ABS: 0.2 10*3/uL (ref 0.0–0.7)
EOS PCT: 3 %
HCT: 36.4 % (ref 36.0–46.0)
Hemoglobin: 11.2 g/dL — ABNORMAL LOW (ref 12.0–15.0)
LYMPHS ABS: 1.4 10*3/uL (ref 0.7–4.0)
LYMPHS PCT: 22 %
MCH: 29.8 pg (ref 26.0–34.0)
MCHC: 30.8 g/dL (ref 30.0–36.0)
MCV: 96.8 fL (ref 78.0–100.0)
MONO ABS: 1.1 10*3/uL — AB (ref 0.1–1.0)
Monocytes Relative: 17 %
Neutro Abs: 3.7 10*3/uL (ref 1.7–7.7)
Neutrophils Relative %: 57 %
PLATELETS: 123 10*3/uL — AB (ref 150–400)
RBC: 3.76 MIL/uL — ABNORMAL LOW (ref 3.87–5.11)
RDW: 15.7 % — AB (ref 11.5–15.5)
WBC: 6.5 10*3/uL (ref 4.0–10.5)

## 2018-02-10 LAB — CBC
HCT: 33 % — ABNORMAL LOW (ref 36.0–46.0)
Hemoglobin: 9.7 g/dL — ABNORMAL LOW (ref 12.0–15.0)
MCH: 29.6 pg (ref 26.0–34.0)
MCHC: 29.4 g/dL — ABNORMAL LOW (ref 30.0–36.0)
MCV: 100.6 fL — AB (ref 78.0–100.0)
PLATELETS: 117 10*3/uL — AB (ref 150–400)
RBC: 3.28 MIL/uL — AB (ref 3.87–5.11)
RDW: 15.5 % (ref 11.5–15.5)
WBC: 7.9 10*3/uL (ref 4.0–10.5)

## 2018-02-10 LAB — BASIC METABOLIC PANEL
ANION GAP: 8 (ref 5–15)
BUN: 54 mg/dL — ABNORMAL HIGH (ref 8–23)
CO2: 24 mmol/L (ref 22–32)
CREATININE: 1.8 mg/dL — AB (ref 0.44–1.00)
Calcium: 9.1 mg/dL (ref 8.9–10.3)
Chloride: 106 mmol/L (ref 98–111)
GFR calc Af Amer: 31 mL/min — ABNORMAL LOW (ref >60)
GFR, EST NON AFRICAN AMERICAN: 27 mL/min — AB (ref >60)
GLUCOSE: 103 mg/dL — AB (ref 70–99)
POTASSIUM: 4.8 mmol/L (ref 3.5–5.1)
Sodium: 138 mmol/L (ref 135–145)

## 2018-02-10 MED ORDER — MYCOPHENOLATE SODIUM 180 MG PO TBEC
360.0000 mg | DELAYED_RELEASE_TABLET | Freq: Two times a day (BID) | ORAL | Status: DC
  Administered 2018-02-10 – 2018-02-12 (×4): 360 mg via ORAL
  Filled 2018-02-10 (×5): qty 2

## 2018-02-10 MED ORDER — PANTOPRAZOLE SODIUM 40 MG IV SOLR: 40.0000 mg | Freq: Two times a day (BID) | INTRAVENOUS | Status: DC

## 2018-02-10 MED ORDER — MELATONIN 3 MG PO TABS
5.0000 mg | ORAL_TABLET | Freq: Every day | ORAL | Status: DC
  Administered 2018-02-10 – 2018-02-11 (×2): 4.5 mg via ORAL
  Filled 2018-02-10 (×2): qty 1.5

## 2018-02-10 MED ORDER — SODIUM CHLORIDE 0.9 % IV SOLN
8.0000 mg/h | INTRAVENOUS | Status: DC
  Administered 2018-02-10: 8 mg/h via INTRAVENOUS
  Filled 2018-02-10 (×2): qty 80

## 2018-02-10 MED ORDER — GABAPENTIN 100 MG PO CAPS
200.0000 mg | ORAL_CAPSULE | Freq: Every day | ORAL | Status: DC
  Administered 2018-02-10 – 2018-02-11 (×2): 200 mg via ORAL
  Filled 2018-02-10 (×2): qty 2

## 2018-02-10 MED ORDER — LEVOTHYROXINE SODIUM 100 MCG PO TABS
100.0000 ug | ORAL_TABLET | Freq: Every day | ORAL | Status: DC
  Administered 2018-02-10 – 2018-02-12 (×3): 100 ug via ORAL
  Filled 2018-02-10 (×3): qty 1

## 2018-02-10 MED ORDER — METOPROLOL TARTRATE 50 MG PO TABS
50.0000 mg | ORAL_TABLET | Freq: Two times a day (BID) | ORAL | Status: DC
  Administered 2018-02-10 – 2018-02-12 (×5): 50 mg via ORAL
  Filled 2018-02-10 (×5): qty 1

## 2018-02-10 MED ORDER — POLYVINYL ALCOHOL 1.4 % OP SOLN
1.0000 [drp] | Freq: Two times a day (BID) | OPHTHALMIC | Status: DC | PRN
  Filled 2018-02-10: qty 15

## 2018-02-10 MED ORDER — VITAMIN K1 1 MG/0.5ML IJ SOLN: 10.0000 mg | Freq: Once | INTRAMUSCULAR | Status: DC

## 2018-02-10 MED ORDER — SODIUM CHLORIDE 0.9 % IV BOLUS
500.0000 mL | Freq: Once | INTRAVENOUS | Status: AC
  Administered 2018-02-10: 500 mL via INTRAVENOUS

## 2018-02-10 MED ORDER — SODIUM CHLORIDE 0.9 % IV SOLN
INTRAVENOUS | Status: DC
  Administered 2018-02-10 – 2018-02-11 (×2): via INTRAVENOUS

## 2018-02-10 MED ORDER — VITAMIN K1 10 MG/ML IJ SOLN
INTRAMUSCULAR | Status: AC
  Filled 2018-02-10: qty 1

## 2018-02-10 MED ORDER — MAGNESIUM OXIDE 400 (241.3 MG) MG PO TABS
400.0000 mg | ORAL_TABLET | Freq: Two times a day (BID) | ORAL | Status: DC
  Administered 2018-02-10 – 2018-02-12 (×5): 400 mg via ORAL
  Filled 2018-02-10 (×5): qty 1

## 2018-02-10 MED ORDER — PROBIOTIC PO CAPS: ORAL_CAPSULE | Freq: Every day | ORAL | Status: DC

## 2018-02-10 MED ORDER — VITAMIN K1 10 MG/ML IJ SOLN
10.0000 mg | Freq: Once | INTRAVENOUS | Status: AC
  Administered 2018-02-10: 10 mg via INTRAVENOUS
  Filled 2018-02-10: qty 1

## 2018-02-10 MED ORDER — SODIUM CHLORIDE 0.9 % IV SOLN: INTRAVENOUS | Status: DC | PRN

## 2018-02-10 MED ORDER — POLYETHYLENE GLYCOL 400 0.25 % OP GEL: 1.0000 [drp] | Freq: Two times a day (BID) | OPHTHALMIC | Status: DC | PRN

## 2018-02-10 MED ORDER — ATROPINE SULFATE 1 MG/ML IJ SOLN
INTRAMUSCULAR | Status: AC | PRN
  Administered 2018-02-10: 1 mg via INTRAVENOUS

## 2018-02-10 MED ORDER — POLYETHYLENE GLYCOL 3350 17 G PO PACK: 17.0000 g | PACK | Freq: Every day | ORAL | Status: DC | PRN

## 2018-02-10 MED ORDER — TACROLIMUS 1 MG PO CAPS
3.0000 mg | ORAL_CAPSULE | Freq: Two times a day (BID) | ORAL | Status: DC
  Administered 2018-02-10 – 2018-02-12 (×5): 3 mg via ORAL
  Filled 2018-02-10 (×5): qty 3

## 2018-02-10 MED ORDER — SODIUM CHLORIDE 0.9 % IV SOLN
80.0000 mg | Freq: Once | INTRAVENOUS | Status: AC
  Administered 2018-02-10: 80 mg via INTRAVENOUS
  Filled 2018-02-10: qty 80

## 2018-02-10 NOTE — ED Provider Notes (Addendum)
[facility] EMERGENCY DEPARTMENT Provider Note   CSN: [id number] Arrival date & time: 02/10/18  4917     History   Chief Complaint Chief Complaint  Patient presents with  . Rectal Bleeding    HPI [name] is a 77 y.o. female.  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  She also is noted to be a Jehovah's Witness and refuses blood transfusions.  She has noted blood in her stool since yesterday.  She says it is bright red blood and clots.  There is no stool.  She feels like she has some cramping before she has to have a bowel movement but she denies any abdominal pain.  No nausea or vomiting.  No fevers.  She had a similar instance once in the past and she notes that the blood was coming from her rectum.  She is followed by Dr. Oletta Lamas with [facility] gastroenterology.     Past Medical History:  Diagnosis Date  . Abdominal wall hernia   . Anemia    "when lupus flares up"  . Anxiety   . Arthritis    "in my joints"  . Atrial fibrillation ([place])   . CAP (community acquired pneumonia) 08/20/2014  . Crohn's disease ([place])   . DVT of leg (deep venous thrombosis) ([place]) 2012-8./13/2013   "have had one in each leg now"  . DVT of lower extremity, bilateral ([place])    "have had them in both legs; last one was on the RLE this year" (04/25/2012)  . GERD (gastroesophageal reflux disease)   . Gout 2016  . History of hiatal hernia   . History of kidney stones   . Hypertension   . Hypothyroidism   . Lower GI bleed 2012  . Numbness and tingling of foot    bilaterally  . OSA on CPAP   . Refusal of blood transfusion for reasons of conscience 12/20/2011   pt is Jehovah's Witness  . Renal cell carcinoma   . Renal disorder    S/P nephrectomy; dialysis; "working fine now" (12/20/11)  . SBO (small bowel  obstruction) ([place]) 11/2016  . SLE (systemic lupus erythematosus) ([place])     Patient Active Problem List   Diagnosis Date Noted  . Left knee pain 08/01/2017  . Right shoulder injury, subsequent encounter 07/10/2017  . Right shoulder pain 06/22/2017  . Hyperlipidemia LDL goal <100 06/06/2017  . Chronic pain of right knee 06/06/2017  . Plantar fasciitis 06/06/2017  . Partial small bowel obstruction ([place]) 11/23/2016  . SBO (small bowel obstruction) ([place]) 08/06/2016  . Community acquired pneumonia   . Pleural effusion, left   . Streptococcal pneumonia ([place]) 08/20/2014  . GERD (gastroesophageal reflux disease) 08/20/2014  . SLE (systemic lupus erythematosus) ([place]) 08/20/2014  . OSA (obstructive sleep apnea) 08/20/2014  . Immunosuppressed status ([place]) 08/20/2014  . Sepsis ([place]) 08/20/2014  . Anemia of chronic disease 08/20/2014  . Syncope 08/20/2014  . History of DVT (deep vein thrombosis) 03/05/2014  . Allergic rhinitis 09/08/2013  . Aortic heart murmur 09/04/2013  . Crohn's disease ([place])   . Renal disorder   . Atrial fibrillation ([place]) 12/20/2011  . CKD (chronic kidney disease) stage 3, GFR 30-59 ml/min ([facility]) 12/20/2011  . History of renal transplant 12/20/2011  . HTN (hypertension) 12/20/2011  . Hypothyroidism 12/20/2011  . Hypersomnia with sleep  apnea, unspecified 09/12/2007    Past Surgical History:  Procedure Laterality Date  . [place]   "found sluggish bone marrow" (04/25/2012)  . BONE MARROW BIOPSY  1993; 1995  . BREAST EXCISIONAL BIOPSY     right  . CATARACT EXTRACTION W/ INTRAOCULAR LENS  IMPLANT, BILATERAL  2005-2010  . CESAREAN SECTION  1984  . CHOLECYSTECTOMY  1995  . COLECTOMY  1998   "removal of abscess" (04/25/2012)  . COLONOSCOPY N/A 08/30/2013   Procedure: COLONOSCOPY;  Surgeon: Winfield Cunas., MD;  Location: [facility];  Service: Endoscopy;  Laterality: N/A;  . COLONOSCOPY WITH PROPOFOL N/A 09/06/2017   Procedure:  COLONOSCOPY WITH PROPOFOL;  Surgeon: Laurence Spates, MD;  Location: [facility];  Service: Endoscopy;  Laterality: N/A;  . COLOSTOMY  1998  . COLOSTOMY REVERSAL  1999   "6 months after placed" (04/25/2012)  . EXCISIONAL HEMORRHOIDECTOMY    . INSERTION OF DIALYSIS CATHETER  1988-2006   "peritoneal and hemodialysis; had multiple grafts and fistulas; last graft clotted off 2012"  . NEPHRECTOMY TRANSPLANTED ORGAN  2006   bilaterally  . PARATHYROID IMPLANT REMOVAL Left    removed parathyroid from neck and implanted in arm  . VENA CAVA FILTER PLACEMENT  2012     OB History   None      Home Medications    Prior to Admission medications   Medication Sig Start Date End Date Taking? Authorizing Provider  acetaminophen (TYLENOL) 325 MG tablet Take 650 mg by mouth every 6 (six) hours as needed for mild pain.    [provider]  allopurinol (ZYLOPRIM) 100 MG tablet Take 1 tablet (100 mg total) by mouth 2 (two) times daily. 07/18/16   Ann Held, DO  calcitRIOL (ROCALTROL) 0.25 MCG capsule Take 0.25-0.5 mcg by mouth See admin instructions. Take 0.25 mcg by mouth daily on Monday, Wednesday, Friday and Sunday. Take 0.5 mcg by mouth daily on all other days    [provider]  epoetin alfa (EPOGEN,PROCRIT) 87867 UNIT/ML injection Inject 40,000 Units into the skin every 21 ( twenty-one) days.     [provider]  gabapentin (NEURONTIN) 100 MG capsule Take 2 capsules (200 mg total) by mouth at bedtime. 07/18/16   Ann Held, DO  levothyroxine (SYNTHROID, LEVOTHROID) 100 MCG tablet Take 1 tablet (100 mcg total) by mouth daily. 07/18/16   Carollee Herter, Yvonne R, DO  MAGNESIUM-OXIDE 400 (241.3 MG) MG tablet Take 400 mg by mouth 2 (two) times daily. 05/19/14   [provider]  Melatonin 5 MG CAPS Take by mouth as directed.    [provider]  metoprolol tartrate (LOPRESSOR) 50 MG tablet Take 50 mg by mouth 2 (two) times daily.    [provider]  mycophenolate (MYFORTIC) 180 MG EC tablet Take 360 mg by mouth 2 (two) times daily.      [provider]  omeprazole (PRILOSEC) 20 MG capsule Take 20 mg by mouth daily. 07/20/16   [provider]  [place] 3 each by mouth daily as needed (for indegestion).    [provider]  polyethylene glycol (MIRALAX / GLYCOLAX) packet Take 17 g by mouth daily as needed for moderate constipation.     [provider]  Polyethylene Glycol 400 (BLINK TEARS) 0.25 % GEL Place 1 drop into both eyes 2 (two) times daily as needed (for dry eyes).    [provider]  predniSONE (DELTASONE) 5 MG tablet  Take 5 mg by mouth daily.     [provider]  Probiotic Product (PROBIOTIC PO) Take 2 tablets by mouth daily.    [provider]  tacrolimus (PROGRAF) 1 MG capsule Take 3 mg by mouth 2 (two) times daily.     [provider]  warfarin (COUMADIN) 2.5 MG tablet Take 2.5-5 mg by mouth See admin instructions. Take 5 mg by mouth daily on Monday, Wednesday, Friday and Sunday. Take 2.5 mg by mouth daily on all other days    [provider]    Family History Family History  Problem Relation Age of Onset  . Heart disease Mother   . Hypertension Unknown        runs in family  . Sleep apnea Sister     Social History Social History   Tobacco Use  . Smoking status: Never Smoker  . Smokeless tobacco: Never Used  Substance Use Topics  . Alcohol use: No  . Drug use: No     Allergies   Amoxicillin; Ampicillin; Ciprofloxacin; Erythromycin; Penicillins; Sulfa antibiotics; Sulfamethoxazole-trimethoprim; Tetracycline; and Labetalol   Review of Systems Review of Systems  Constitutional: Negative for chills, diaphoresis, fatigue and fever.  HENT: Negative for congestion, rhinorrhea and sneezing.   Eyes: Negative.   Respiratory: Negative for cough, chest tightness and shortness of breath.   Cardiovascular: Negative for  chest pain and leg swelling.  Gastrointestinal: Positive for blood in stool and rectal pain. Negative for abdominal pain, diarrhea, nausea and vomiting.  Genitourinary: Negative for difficulty urinating, flank pain, frequency and hematuria.  Musculoskeletal: Negative for arthralgias and back pain.  Skin: Negative for rash.  Neurological: Positive for light-headedness. Negative for dizziness, speech difficulty, weakness, numbness and headaches.     Physical Exam Updated Vital Signs BP 117/84   Pulse (!) 102   Temp 98.2 F (36.8 C) (Oral)   Resp (!) 22   Ht 5' 3"  (1.6 m)   Wt 61.2 kg   LMP 03/05/2014   SpO2 (!) 76%   BMI 23.91 kg/m   Physical Exam  Constitutional: She is oriented to person, place, and time. She appears well-developed and well-nourished.  HENT:  Head: Normocephalic and atraumatic.  Eyes: Pupils are equal, round, and reactive to light.  Neck: Normal range of motion. Neck supple.  Cardiovascular: Normal rate, regular rhythm and normal heart sounds.  Pulmonary/Chest: Effort normal and breath sounds normal. No respiratory distress. She has no wheezes. She has no rales. She exhibits no tenderness.  Abdominal: Soft. Bowel sounds are normal. There is no tenderness. There is no rebound and no guarding.  Genitourinary:  Genitourinary Comments: Patient has some scarring to the anus and it is hard to do a complete rectal exam but she does have bright red blood, no stool is noted  Musculoskeletal: Normal range of motion. She exhibits no edema.  Lymphadenopathy:    She has no cervical adenopathy.  Neurological: She is alert and oriented to person, place, and time.  Skin: Skin is warm and dry. No rash noted.  Psychiatric: She has a normal mood and affect.     ED Treatments / Results  Labs (all labs ordered are listed, but only abnormal results are displayed) Labs Reviewed  BASIC METABOLIC PANEL - Abnormal; Notable for the following components:      Result Value    Glucose, Bld 103 (*)    BUN 54 (*)    Creatinine, Ser 1.80 (*)    GFR calc non Af Amer 27 (*)  GFR calc Af Amer 31 (*)    All other components within normal limits  CBC WITH DIFFERENTIAL/PLATELET - Abnormal; Notable for the following components:   RBC 3.76 (*)    Hemoglobin 11.2 (*)    RDW 15.7 (*)    Platelets 123 (*)    Monocytes Absolute 1.1 (*)    All other components within normal limits  PROTIME-INR - Abnormal; Notable for the following components:   Prothrombin Time 17.1 (*)    All other components within normal limits    EKG EKG Interpretation  Date/Time:  Saturday February 10 2018 08:02:25 EDT Ventricular Rate:  72 PR Interval:    QRS Duration: 130 QT Interval:  411 QTC Calculation: 450 R Axis:   87 Text Interpretation:  Sinus rhythm Right bundle branch block Minimal ST elevation, lateral leads Baseline wander in lead(s) I II aVR V5 similar to prior EKG Confirmed by Malvin Johns [id number] on 02/10/2018 8:13:34 AM   Radiology Ct Abdomen Pelvis Wo Contrast  Result Date: 02/10/2018 CLINICAL DATA:  77 year old female with bleeding and passing clots from rectum. Symptoms since yesterday. Renal insufficiency precludes IV contrast currently. EXAM: CT ABDOMEN AND PELVIS WITHOUT CONTRAST TECHNIQUE: Multidetector CT imaging of the abdomen and pelvis was performed following the standard protocol without IV contrast. COMPARISON:  CT Abdomen and Pelvis 11/23/2016 and earlier. FINDINGS: Lower chest: Mild extrapleural fat is stable. No pleural effusion. Stable mild lung base scarring. Calcified coronary artery atherosclerosis. No pericardial effusion. Hepatobiliary: Surgically absent gallbladder. Stable and negative liver. Pancreas: Atrophied, stable. Spleen: Negative. Adrenals/Urinary Tract: Normal right adrenal gland. The left adrenal is diminutive or absent. Both native kidneys are surgically absent with numerous renal fossa surgical clips. There is a right lower quadrant/pelvic renal  transplant with no hydronephrosis or perinephric stranding. Unremarkable urinary bladder. Stomach/Bowel: Retained stool and hyperdense material in the distal sigmoid colon. Somewhat featureless appearance of the sigmoid and rectum but no surrounding mesenteric inflammation. Occasional sigmoid and distal descending colon diverticula. Similar featureless appearance of the splenic flexure with retained stool. Redundant hepatic flexure and right colon partially contained within a chronic broad-based right abdominal hernia. There is diverticulosis at the hepatic flexure. Proximal transverse colon is mildly gas distended. No colonic mesenteric inflammation. No dilated small bowel.  Decompressed stomach and duodenum. No abdominal free air, free fluid. Vascular/Lymphatic: Severe diffuse aortic and iliofemoral calcified atherosclerosis. 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 IVC filter in place and appears stable. Vascular patency is not evaluated in the absence of IV contrast. No lymphadenopathy. Reproductive: Negative. Other: No pelvic free fluid. Musculoskeletal: Advanced chronic disc and endplate degeneration I20-B55. 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 Severe chronic degenerative changes at the L1-L2 posterior elements. Stable visualized osseous structures. IMPRESSION: 1. Hyperdense material in the distal sigmoid colon which could be hemorrhage. Somewhat featureless appearance of the large bowel from the mid transverse distally, a mild colitis is difficult to exclude. However, no focal bowel inflammation is identified. No free fluid. 2. 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 Electronically Signed   By: Genevie Ann M.D.   On: 02/10/2018 09:52    Procedures Procedures (including critical care time)  Angiocath  insertion Performed by: Malvin Johns  Consent: Verbal consent obtained. Risks and benefits: risks, benefits and alternatives were discussed Time out: Immediately prior to procedure a "time out" was called to verify the correct patient, procedure, equipment, support  staff and site/side marked as required.  Preparation: Patient was prepped and draped in the usual sterile fashion.  Vein Location: basilic   Ultrasound Guided  Gauge: 20  Normal blood return and flush without difficulty Patient tolerance: Patient tolerated the procedure well with no immediate complications.     Medications Ordered in ED Medications  phytonadione (VITAMIN K) 10 MG/ML SQ injection (  Not Given 02/10/18 0909)  0.9 %  sodium chloride infusion (has no administration in time range)  phytonadione (VITAMIN K) 10 MG/ML SQ injection (  Not Given 02/10/18 0935)  sodium chloride 0.9 % bolus 500 mL (500 mLs Intravenous New Bag/Given 02/10/18 0845)  atropine injection (1 mg Intravenous Given 02/10/18 0846)  phytonadione (VITAMIN K) 10 mg in dextrose 5 % 50 mL IVPB (10 mg Intravenous New Bag/Given 02/10/18 0909)     Initial Impression / Assessment and Plan / ED Course  I have reviewed the triage vital signs and the nursing notes.  Pertinent labs & imaging results that were available during my care of the patient were reviewed by me and considered in my medical decision making (see chart for details).     Patient is a 77 year old female who presents with lower GI bleeding.  Since being in the ED, she has had several episodes of passing bright red clots.  Her hemoglobin is 11.2 but I suspect it is going to drop.  She started to become a little hypotensive with a blood pressure of 84/51.  She was getting a fluid bolus.  During that time she had an episode where she became unresponsive.  I was in the room during this episode and her heart rate bradycardia down to around 20.  She became apneic.  She was ventilated with a  bag-valve-mask.  She did have a faint pulse but her heart rate was continuing to drop.  She was given a dose of atropine.  She responded to this and her heart rate and blood pressure both improved.  She started to wake up and within about 15 to 20 minutes, she was fully alert and oriented.  Her blood pressure remained around 160s over 80s.  However it starting to trend downward.  She is mildly tachycardic with a heart rate of 102.  I did have a discussion with both her and her husband regarding blood products and they are both adamantly opposed to her receiving blood products even if it results in her death.  She was given vitamin K even though her INR is subtherapeutic.  CT scan shows possible blood in sigmoid colon, but otherwise unremarkable.  TXA was considered but we do not have that product in our facility.  I spoke with Dr. Watt Climes with [facility] who will see the patient when she is [facility].  I spoke with the hospitalist, Dr. Kyung Bacca, who has accepted the patient for admission.  However there are no stepdown beds currently [facility].  I feel that it is appropriate for her to be transferred to a facility where GI is readily available.  I spoke with Dr. Rex Kras in the emergency department who has accepted the patient for transfer to the [facility] emergency department until a stepdown bed is available.  CRITICAL CARE Performed by: Malvin Johns Total critical care time: 75 minutes Critical care time was exclusive of separately billable procedures and treating other patients. Critical care was necessary to treat or prevent imminent or life-threatening deterioration. Critical care was time spent personally by me on  the following activities: development of treatment plan with patient and/or surrogate as well as nursing, discussions with consultants, evaluation of patient's response to treatment, examination of patient, obtaining history from patient or surrogate, ordering and  performing treatments and interventions, ordering and review of laboratory studies, ordering and review of radiographic studies, pulse oximetry and re-evaluation of patient's condition.     Final Clinical Impressions(s) / ED Diagnoses   Final diagnoses:  Acute GI bleeding  Hypotension, unspecified hypotension type    ED Discharge Orders    None       Malvin Johns, MD 02/10/18 1017    Malvin Johns, MD 02/10/18 1018

## 2018-02-10 NOTE — ED Triage Notes (Signed)
Bright red rectal bleeding since yesterday with rectal discomfort.

## 2018-02-10 NOTE — ED Notes (Signed)
Report received from [facility] by this RN. Pt here for rectal bleed, had vagal/syncopal episode this morning and sent here for admission. Pt is jehovah's witness and will not receive blood.

## 2018-02-10 NOTE — ED Notes (Signed)
GI in room to see pt. Pt is stable. He ordered clear liquid diet.

## 2018-02-10 NOTE — ED Notes (Signed)
Pt noted to have a large amount of blood with large clots underneath her. Pt assisted in cleaning up, bed linens change. EDP made aware.

## 2018-02-10 NOTE — ED Notes (Signed)
Patient transported to CT with RN 

## 2018-02-10 NOTE — Consult Note (Signed)
Reason for Consult: GI bleed Referring Physician: Hospital team  [name] is an 77 y.o. female.  HPI: Patient known to our service.  Case discussed with ER physician as well as her son patient was seen and examined and her bleeding started last night she has not had a bowel movement since leaving the emergency room she saw clots and bright red blood and has not been on any aspirin or nonsteroidals and has not started any new medicines he denies any abdominal pain has no new complaints currently is doing okay but did believe she almost passed out or passed out and specifically her previous colonoscopy in May was reviewed and we discussed diverticuli and AVMs  Past Medical History:  Diagnosis Date  . Abdominal wall hernia   . Anemia    "when lupus flares up"  . Anxiety   . Arthritis    "in my joints"  . Atrial fibrillation ([place])   . CAP (community acquired pneumonia) 08/20/2014  . Crohn's disease ([place])   . DVT of leg (deep venous thrombosis) ([place]) 2012-8./13/2013   "have had one in each leg now"  . DVT of lower extremity, bilateral ([place])    "have had them in both legs; last one was on the RLE this year" (04/25/2012)  . GERD (gastroesophageal reflux disease)   . Gout 2016  . History of hiatal hernia   . History of kidney stones   . Hypertension   . Hypothyroidism   . Lower GI bleed 2012  . Numbness and tingling of foot    bilaterally  . OSA on CPAP   . Refusal of blood transfusion for reasons of conscience 12/20/2011   pt is Jehovah's Witness  . Renal cell carcinoma   . Renal disorder    S/P nephrectomy; dialysis; "working fine now" (12/20/11)  . SBO (small bowel obstruction) ([place]) 11/2016  . SLE (systemic lupus erythematosus) ([place])     Past Surgical History:  Procedure Laterality Date  . [place]   "found sluggish bone marrow" (04/25/2012)  . BONE MARROW BIOPSY  1993; 1995  . BREAST EXCISIONAL BIOPSY     right  . CATARACT EXTRACTION W/  INTRAOCULAR LENS  IMPLANT, BILATERAL  2005-2010  . CESAREAN SECTION  1984  . CHOLECYSTECTOMY  1995  . COLECTOMY  1998   "removal of abscess" (04/25/2012)  . COLONOSCOPY N/A 08/30/2013   Procedure: COLONOSCOPY;  Surgeon: Winfield Cunas., MD;  Location: [facility];  Service: Endoscopy;  Laterality: N/A;  . COLONOSCOPY WITH PROPOFOL N/A 09/06/2017   Procedure: COLONOSCOPY WITH PROPOFOL;  Surgeon: Laurence Spates, MD;  Location: [place] [facility];  Service: Endoscopy;  Laterality: N/A;  . COLOSTOMY  1998  . COLOSTOMY REVERSAL  1999   "6 months after placed" (04/25/2012)  . EXCISIONAL HEMORRHOIDECTOMY    . INSERTION OF DIALYSIS CATHETER  1988-2006   "peritoneal and hemodialysis; had multiple grafts and fistulas; last graft clotted off 2012"  . NEPHRECTOMY TRANSPLANTED ORGAN  2006   bilaterally  . PARATHYROID IMPLANT REMOVAL Left    removed parathyroid from neck and implanted in arm  . VENA CAVA FILTER PLACEMENT  2012    Family History  Problem Relation Age of Onset  . Heart disease Mother   . Hypertension Unknown        runs in family  . Sleep apnea Sister     Social History:  reports that she has never smoked. She has never used smokeless tobacco. She reports that  she does not drink alcohol or use drugs.  Allergies:  Allergies  Allergen Reactions  . Amoxicillin Anaphylaxis  . Ampicillin Anaphylaxis  . Ciprofloxacin Swelling and Other (See Comments)    "of my throat"  . Erythromycin Anaphylaxis  . Penicillins Anaphylaxis and Other (See Comments)    Has patient had a PCN reaction causing immediate rash, facial/tongue/throat swelling, SOB or lightheadedness with hypotension: Yes Has patient had a PCN reaction causing severe rash involving mucus membranes or skin necrosis: No Has patient had a PCN reaction that required hospitalization: Yes Has patient had a PCN reaction occurring within the last 10 years: Yes If all of the above answers are "NO", then may proceed with Cephalosporin  use.  . Sulfa Antibiotics Itching  . Sulfamethoxazole-Trimethoprim Itching  . Tetracycline Anaphylaxis  . Labetalol Nausea And Vomiting    Medications: I have reviewed the patient's current medications.  Results for orders placed or performed during the hospital encounter of 02/10/18 (from the past 48 hour(s))  Basic metabolic panel     Status: Abnormal   Collection Time: 02/10/18  8:11 AM  Result Value Ref Range   Sodium 138 135 - 145 mmol/L   Potassium 4.8 3.5 - 5.1 mmol/L   Chloride 106 98 - 111 mmol/L   CO2 24 22 - 32 mmol/L   Glucose, Bld 103 (H) 70 - 99 mg/dL   BUN 54 (H) 8 - 23 mg/dL   Creatinine, Ser 1.80 (H) 0.44 - 1.00 mg/dL   Calcium 9.1 8.9 - 10.3 mg/dL   GFR calc non Af Amer 27 (L) >60 mL/min   GFR calc Af Amer 31 (L) >60 mL/min    Comment: (NOTE) The eGFR has been calculated using the CKD EPI equation. This calculation has not been validated in all clinical situations. eGFR's persistently <60 mL/min signify possible Chronic Kidney Disease.    Anion gap 8 5 - 15    Comment: Performed at [facility], [address] [place], [place] [address]  CBC with Differential     Status: Abnormal   Collection Time: 02/10/18  8:11 AM  Result Value Ref Range   WBC 6.5 4.0 - 10.5 K/uL   RBC 3.76 (L) 3.87 - 5.11 MIL/uL   Hemoglobin 11.2 (L) 12.0 - 15.0 g/dL   HCT 36.4 36.0 - 46.0 %   MCV 96.8 78.0 - 100.0 fL   MCH 29.8 26.0 - 34.0 pg   MCHC 30.8 30.0 - 36.0 g/dL   RDW 15.7 (H) 11.5 - 15.5 %   Platelets 123 (L) 150 - 400 K/uL   Neutrophils Relative % 57 %   Neutro Abs 3.7 1.7 - 7.7 K/uL   Lymphocytes Relative 22 %   Lymphs Abs 1.4 0.7 - 4.0 K/uL   Monocytes Relative 17 %   Monocytes Absolute 1.1 (H) 0.1 - 1.0 K/uL   Eosinophils Relative 3 %   Eosinophils Absolute 0.2 0.0 - 0.7 K/uL   Basophils Relative 1 %   Basophils Absolute 0.0 0.0 - 0.1 K/uL    Comment: Performed at [facility], [address] [place], [place] [address]  Protime-INR      Status: Abnormal   Collection Time: 02/10/18  8:11 AM  Result Value Ref Range   Prothrombin Time 17.1 (H) 11.4 - 15.2 seconds   INR 1.41     Comment: Performed at [facility], [address] [place], [place] [address]    Ct Abdomen Pelvis  Wo Contrast  Result Date: 02/10/2018 CLINICAL DATA:  77 year old female with bleeding and passing clots from rectum. Symptoms since yesterday. Renal insufficiency precludes IV contrast currently. EXAM: CT ABDOMEN AND PELVIS WITHOUT CONTRAST TECHNIQUE: Multidetector CT imaging of the abdomen and pelvis was performed following the standard protocol without IV contrast. COMPARISON:  CT Abdomen and Pelvis 11/23/2016 and earlier. FINDINGS: Lower chest: Mild extrapleural fat is stable. No pleural effusion. Stable mild lung base scarring. Calcified coronary artery atherosclerosis. No pericardial effusion. Hepatobiliary: Surgically absent gallbladder. Stable and negative liver. Pancreas: Atrophied, stable. Spleen: Negative. Adrenals/Urinary Tract: Normal right adrenal gland. The left adrenal is diminutive or absent. Both native kidneys are surgically absent with numerous renal fossa surgical clips. 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 Unremarkable urinary bladder. Stomach/Bowel: Retained stool and hyperdense material in the distal sigmoid colon. Somewhat featureless appearance of the sigmoid and rectum but no surrounding mesenteric inflammation. Occasional sigmoid and distal descending colon diverticula. Similar featureless appearance of the splenic flexure with retained stool. Redundant hepatic flexure and right colon partially contained within a chronic broad-based right abdominal hernia. There is diverticulosis at the hepatic flexure. Proximal transverse colon is mildly gas distended. No colonic mesenteric inflammation. No dilated small bowel.  Decompressed stomach and duodenum. No abdominal free  air, free fluid. Vascular/Lymphatic: Severe diffuse aortic and iliofemoral calcified atherosclerosis. 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 IVC filter in place and appears stable. Vascular patency is not evaluated in the absence of IV contrast. No lymphadenopathy. Reproductive: Negative. Other: No pelvic free fluid. Musculoskeletal: Advanced chronic disc and endplate degeneration T61-W43. 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 Severe chronic degenerative changes at the L1-L2 posterior elements. Stable visualized osseous structures. IMPRESSION: 1. Hyperdense material in the distal sigmoid colon which could be hemorrhage. Somewhat featureless appearance of the large bowel from the mid transverse distally, a mild colitis is difficult to exclude. However, no focal bowel inflammation is identified. No free fluid. 2. 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 Electronically Signed   By: Genevie Ann M.D.   On: 02/10/2018 09:52    ROS negative except above Blood pressure 131/64, pulse 80, temperature 97.9 F (36.6 C), temperature source Oral, resp. rate 16, height _0  (1.6 m), weight 61.2 kg, last menstrual period 03/05/2014, SpO2 100 %. Physical Exam vital signs stable afebrile no distress abdomen soft nontender good bowel sounds hemoglobin stable BUN/creatinine stable INR okay Assessment/Plan: GI bleed sounds more diverticuli than AVM Plan: Clear liquid diet okay if signs of increased bleeding proceed with a stat nuclear bleeding scan otherwise observe and monitor and watch H&H but probably not advance diet until Monday if there is signs of bleeding and call me sooner if questions or problems  Mohsin Crum E 02/10/2018, 1:54 PM

## 2018-02-10 NOTE — H&P (Signed)
History and Physical  [name] WIO:[id number] DOB: 1940-07-07 DOA: 02/10/2018  Referring physician: Dr. Laverta [place] PCP: Carollee Herter, Alferd Apa, DO  Outpatient Specialists: Dr. Oletta Lamas with [facility] gastroenterology. Patient coming from: Transferred from [facility] & is able to ambulate yes  Chief Complaint: GI bleed  HPI: [name] is a 77 y.o. female Jehovah witness with medical history significant for abdominal wall hernia anemia anxiety arthritis DVT, atrial fibrillation on Coumadin, Crohn's disease, hypertension hypothyroidism history of lower GI bleed obstructive sleep apnea on CPAP who was transferred from [facility] due to GI bleed she had several bright red blood and she refuses blood transfusion due to her religion and it did not have GI specialist.  Patient stated that she has had a history of diverticulosis in the past and she has had previous GI bleed.  She stated that she had a scope done in May 2019 and it showed diverticulitis  ED Course: She denies any GI bleed since arrival in [place], ED,she was given vitamin K at the [facility] urgent emergency department because she is on Coumadin GI was paged  Review of Systems: Gastroenterology positive GI bleed rectal bleed denies abdominal pain no diarrhea  Pt denies any denies fever chills no headaches.  Review of systems are otherwise negative   Past Medical History:  Diagnosis Date  . Abdominal wall hernia   . Anemia    "when lupus flares up"  . Anxiety   . Arthritis    "in my joints"  . Atrial fibrillation ([place])   . CAP (community acquired pneumonia) 08/20/2014  . Crohn's disease ([place])   . DVT of leg (deep venous thrombosis) ([place]) 2012-8./13/2013   "have had one in each leg now"  . DVT of lower extremity, bilateral ([place])    "have had them in both legs; last one was on the RLE this year" (04/25/2012)  . GERD (gastroesophageal reflux disease)   . Gout 2016  . History of hiatal hernia     . History of kidney stones   . Hypertension   . Hypothyroidism   . Lower GI bleed 2012  . Numbness and tingling of foot    bilaterally  . OSA on CPAP   . Refusal of blood transfusion for reasons of conscience 12/20/2011   pt is Jehovah's Witness  . Renal cell carcinoma   . Renal disorder    S/P nephrectomy; dialysis; "working fine now" (12/20/11)  . SBO (small bowel obstruction) ([place]) 11/2016  . SLE (systemic lupus erythematosus) ([place])    Past Surgical History:  Procedure Laterality Date  . [place]   "found sluggish bone marrow" (04/25/2012)  . BONE MARROW BIOPSY  1993; 1995  . BREAST EXCISIONAL BIOPSY     right  . CATARACT EXTRACTION W/ INTRAOCULAR LENS  IMPLANT, BILATERAL  2005-2010  . CESAREAN SECTION  1984  . CHOLECYSTECTOMY  1995  . COLECTOMY  1998   "removal of abscess" (04/25/2012)  . COLONOSCOPY N/A 08/30/2013   Procedure: COLONOSCOPY;  Surgeon: Winfield Cunas., MD;  Location: [facility];  Service: Endoscopy;  Laterality: N/A;  . COLONOSCOPY WITH PROPOFOL N/A 09/06/2017   Procedure: COLONOSCOPY WITH PROPOFOL;  Surgeon: Laurence Spates, MD;  Location: [place];  Service: Endoscopy;  Laterality: N/A;  . COLOSTOMY  1998  . COLOSTOMY REVERSAL  1999   "6 months after placed" (04/25/2012)  . EXCISIONAL HEMORRHOIDECTOMY    . INSERTION OF DIALYSIS CATHETER  6301-6010   "peritoneal and hemodialysis; had multiple grafts and fistulas; last graft clotted off 2012"  . NEPHRECTOMY TRANSPLANTED ORGAN  2006   bilaterally  . PARATHYROID IMPLANT REMOVAL Left    removed parathyroid from neck and implanted in arm  . VENA CAVA FILTER PLACEMENT  2012    Social History:  reports that she has never smoked. She has never used smokeless tobacco. She reports that she does not drink alcohol or use drugs.   Allergies  Allergen Reactions  . Amoxicillin Anaphylaxis  . Ampicillin Anaphylaxis  . Ciprofloxacin Swelling and Other (See Comments)    "of my  throat"  . Erythromycin Anaphylaxis  . Penicillins Anaphylaxis and Other (See Comments)    Has patient had a PCN reaction causing immediate rash, facial/tongue/throat swelling, SOB or lightheadedness with hypotension: Yes Has patient had a PCN reaction causing severe rash involving mucus membranes or skin necrosis: No Has patient had a PCN reaction that required hospitalization: Yes Has patient had a PCN reaction occurring within the last 10 years: Yes If all of the above answers are "NO", then may proceed with Cephalosporin use.  . Sulfa Antibiotics Itching  . Sulfamethoxazole-Trimethoprim Itching  . Tetracycline Anaphylaxis  . Labetalol Nausea And Vomiting    Family History  Problem Relation Age of Onset  . Heart disease Mother   . Hypertension Unknown        runs in family  . Sleep apnea Sister       Prior to Admission medications   Medication Sig Start Date End Date Taking? Authorizing Provider  acetaminophen (TYLENOL) 325 MG tablet Take 650 mg by mouth every 6 (six) hours as needed for mild pain.   Yes [provider]  allopurinol (ZYLOPRIM) 100 MG tablet Take 1 tablet (100 mg total) by mouth 2 (two) times daily. 07/18/16  Yes Roma Schanz R, DO  calcitRIOL (ROCALTROL) 0.25 MCG capsule Take 0.25-0.5 mcg by mouth See admin instructions. Take 0.5 mcg by mouth daily on Monday, Wednesday, Friday and Take 0.25 mcg by mouth daily on all other days   Yes [provider]  epoetin alfa (EPOGEN,PROCRIT) 93235 UNIT/ML injection Inject 40,000 Units into the skin every 21 ( twenty-one) days.    Yes [provider]  gabapentin (NEURONTIN) 100 MG capsule Take 2 capsules (200 mg total) by mouth at bedtime. 07/18/16  Yes Ann Held, DO  levothyroxine (SYNTHROID, LEVOTHROID) 100 MCG tablet Take 1 tablet (100 mcg total) by mouth daily. 07/18/16  Yes Lowne Chase, Yvonne R, DO  MAGNESIUM-OXIDE 400 (241.3 MG) MG tablet Take 400 mg by mouth 2 (two) times daily.  05/19/14  Yes [provider]  Melatonin 5 MG CAPS Take 5 mg by mouth daily.    Yes [provider]  metoprolol tartrate (LOPRESSOR) 50 MG tablet Take 50 mg by mouth 2 (two) times daily.   Yes [provider]  mycophenolate (MYFORTIC) 180 MG EC tablet Take 360 mg by mouth 2 (two) times daily.     Yes [provider]  omeprazole (PRILOSEC) 20 MG capsule Take 20 mg by mouth daily. 07/20/16  Yes [provider]  polyethylene glycol (MIRALAX / GLYCOLAX) packet Take 17 g by mouth daily as needed for moderate constipation.    Yes [provider]  Polyethylene Glycol 400 (BLINK TEARS) 0.25 % GEL Place 1 drop into both eyes 2 (two) times daily as needed (for dry eyes).   Yes [provider]  predniSONE (DELTASONE) 5  MG tablet Take 5 mg by mouth daily.    Yes [provider]  Probiotic Product (PROBIOTIC PO) Take 2 tablets by mouth daily.   Yes [provider]  tacrolimus (PROGRAF) 1 MG capsule Take 3 mg by mouth 2 (two) times daily.    Yes [provider]  warfarin (COUMADIN) 2.5 MG tablet Take 1.25-2.5 mg by mouth See admin instructions. Take 2.5 mg by mouth daily on Monday, Wednesday, Friday and Take 1.25 mg by mouth daily on all other days   Yes [provider]    Physical Exam: BP 136/75   Pulse 74   Temp 97.9 F (36.6 C) (Oral)   Resp 17   Ht 5' 3"  (1.6 m)   Wt 61.2 kg   LMP 03/05/2014   SpO2 100%   BMI 23.91 kg/m   General: Alert and oriented x 3 Eyes: No jaundice no conjunctival injection ENT: Nares are clear Neck: Supple Cardiovascular: Regular rate and rhythm with no murmur no lower extremity edema pulses are palpable Respiratory: Respiration is unlabored, no wheezing no rales no rhonchi Abdomen: Abdomen is soft nontender no hepatosplenomegaly bowel sounds are normal Skin: Skin is clear Musculoskeletal: Moves all 4 extremities there is no edema Psychiatric: Behavior and affect are  appropriate Neurologic: Alert oriented x3 no focal deficit.          Labs on Admission:  Basic Metabolic Panel: Recent Labs  Lab 02/10/18 0811  NA 138  K 4.8  CL 106  CO2 24  GLUCOSE 103*  BUN 54*  CREATININE 1.80*  CALCIUM 9.1   Liver Function Tests: No results for input(s): AST, ALT, ALKPHOS, BILITOT, PROT, ALBUMIN in the last 168 hours. No results for input(s): LIPASE, AMYLASE in the last 168 hours. No results for input(s): AMMONIA in the last 168 hours. CBC: Recent Labs  Lab 02/08/18 1300 02/10/18 0811  WBC  --  6.5  NEUTROABS  --  3.7  HGB 11.8* 11.2*  HCT  --  36.4  MCV  --  96.8  PLT  --  123*   Cardiac Enzymes: No results for input(s): CKTOTAL, CKMB, CKMBINDEX, TROPONINI in the last 168 hours.  BNP (last 3 results) No results for input(s): BNP in the last 8760 hours.  ProBNP (last 3 results) No results for input(s): PROBNP in the last 8760 hours.  CBG: No results for input(s): GLUCAP in the last 168 hours.  Radiological Exams on Admission: Ct Abdomen Pelvis Wo Contrast  Result Date: 02/10/2018 CLINICAL DATA:  77 year old female with bleeding and passing clots from rectum. Symptoms since yesterday. Renal insufficiency precludes IV contrast currently. EXAM: CT ABDOMEN AND PELVIS WITHOUT CONTRAST TECHNIQUE: Multidetector CT imaging of the abdomen and pelvis was performed following the standard protocol without IV contrast. COMPARISON:  CT Abdomen and Pelvis 11/23/2016 and earlier. FINDINGS: Lower chest: Mild extrapleural fat is stable. No pleural effusion. Stable mild lung base scarring. Calcified coronary artery atherosclerosis. No pericardial effusion. Hepatobiliary: Surgically absent gallbladder. Stable and negative liver. Pancreas: Atrophied, stable. Spleen: Negative. Adrenals/Urinary Tract: Normal right adrenal gland. The left adrenal is diminutive or absent. Both native kidneys are surgically absent with numerous renal fossa surgical clips. There is a  right lower quadrant/pelvic renal transplant with no hydronephrosis or perinephric stranding. Unremarkable urinary bladder. Stomach/Bowel: Retained stool and hyperdense material in the distal sigmoid colon. Somewhat featureless appearance of the sigmoid and rectum but no surrounding mesenteric inflammation. Occasional sigmoid and distal descending colon diverticula. Similar featureless appearance of the splenic  flexure with retained stool. Redundant hepatic flexure and right colon partially contained within a chronic broad-based right abdominal hernia. There is diverticulosis at the hepatic flexure. Proximal transverse colon is mildly gas distended. No colonic mesenteric inflammation. No dilated small bowel.  Decompressed stomach and duodenum. No abdominal free air, free fluid. Vascular/Lymphatic: Severe diffuse aortic and iliofemoral calcified atherosclerosis. 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 IVC filter in place and appears stable. Vascular patency is not evaluated in the absence of IV contrast. No lymphadenopathy. Reproductive: Negative. Other: No pelvic free fluid. Musculoskeletal: Advanced chronic disc and endplate degeneration X28-F18. 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 Severe chronic degenerative changes at the L1-L2 posterior elements. Stable visualized osseous structures. IMPRESSION: 1. Hyperdense material in the distal sigmoid colon which could be hemorrhage. Somewhat featureless appearance of the large bowel from the mid transverse distally, a mild colitis is difficult to exclude. However, no focal bowel inflammation is identified. No free fluid. 2. 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 Electronically Signed   By: Genevie Ann M.D.   On: 02/10/2018 09:52    EKG:  None  Assessment/Plan Present on Admission: . Atrial fibrillation ([place]) . HTN (hypertension) . Crohn's disease ([place]) . Hypersomnia with sleep apnea . CKD (chronic kidney disease) stage 3, GFR 30-59 ml/min (HCC) . Hypothyroidism . (Resolved) DVT of leg (deep venous thrombosis) ([place]) . GI bleed . Lower GI bleed  1.  Lower GI bleed with bright red blood. Gastroenterology has been consulted they will see patient IV Protonix was started 2.  Atrial fibrillation with history of DVT she is on Coumadin which is subtherapeutic INR is 1.4.  She received vitamin K in the emergency department at [facility]. 3.  Hypertension improved when she came in her blood pressure was that 170s we will continue her home blood pressure medicine. 4.  Chronic kidney disease stage III with history of renal transplant.  We will monitor her creatinine and avoid nephrotoxic medication 5.  Hypothyroidism continue current medication , levothyroxine   Principal Problem:   GI bleed Active Problems:   Hypersomnia with sleep apnea   Atrial fibrillation (HCC)   CKD (chronic kidney disease) stage 3, GFR 30-59 ml/min (HCC)   History of renal transplant   HTN (hypertension)   Hypothyroidism   Crohn's disease (HCC)   History of DVT (deep vein thrombosis)   Lower GI bleed   DVT prophylaxis: SCD  Code Status: Full code  Family Communication: Son at bedside  Disposition Plan: Home when stable  Consults called: GI  Admission status: Inpatient    Cristal Deer MD [facility] Pager [phone]  If 7PM-7AM, please contact night-coverage [email or web address] Password [email or web address]  02/10/2018, 3:29 PM

## 2018-02-10 NOTE — ED Provider Notes (Signed)
Blood pressure (!) 169/88, pulse 89, temperature 97.9 F (36.6 C), temperature source Oral, resp. rate 20, height 5' 3"  (1.6 m), weight 61.2 kg, last menstrual period 03/05/2014, SpO2 98 %.  Patient accepted in transfer from [facility]. In short, [name] is a 77 y.o. female with a chief complaint of Rectal Bleeding .  Refer to the original H&P for additional details.  The current plan of care is to f/u with GI and admit.   11:51 AM On arrival the patient is feeling well.  Her blood pressure is 287O systolic with no tachycardia.  She tells me most of her symptoms are present when standing.  She was given vitamin K for her Coumadin with INR already subtherapeutic.  I have paged GI to let them know the patient is here in the [facility] emergency department.  We will discuss admission with the hospitalist.   Discussed patient's case with Hospitalist to request admission. Patient and family (if present) updated with plan. Care transferred to Hospitalist service.  I reviewed all nursing notes, vitals, pertinent old records, EKGs, labs, imaging (as available).    Margette Fast, MD 02/10/18 [phone]

## 2018-02-10 NOTE — Progress Notes (Signed)
[name] is a 77 y.o. female patient admitted from ED awake, alert - oriented  X 4 - no acute distress noted.  VSS - Blood pressure (!) 143/85, pulse 75, temperature 98.2 F (36.8 C), temperature source Oral, resp. rate 19, height 5' 3"  (1.6 m), weight 62.2 kg, last menstrual period 03/05/2014, SpO2 100 %.    IV in place, occlusive dsg intact without redness.  Orientation to room, and floor completed with information packet given to patient/family.  Patient declined safety video at this time.  Admission INP armband ID verified with patient/family, and in place.   SR up x 2, fall assessment complete, with patient and family able to verbalize understanding of risk associated with falls, and verbalized understanding to call nsg before up out of bed.  Call light within reach, patient able to voice, and demonstrate understanding.  Skin, clean-dry- intact without evidence of bruising, or skin tears.   No evidence of skin break down noted on exam.     Will cont to eval and treat per MD orders.  Holley Raring, RN 02/10/2018 4:56 PM

## 2018-02-11 DIAGNOSIS — N183 Chronic kidney disease, stage 3 (moderate): Secondary | ICD-10-CM

## 2018-02-11 DIAGNOSIS — I4891 Unspecified atrial fibrillation: Secondary | ICD-10-CM

## 2018-02-11 DIAGNOSIS — Z940 Kidney transplant status: Secondary | ICD-10-CM

## 2018-02-11 LAB — BASIC METABOLIC PANEL
ANION GAP: 6 (ref 5–15)
BUN: 34 mg/dL — AB (ref 8–23)
CALCIUM: 8.6 mg/dL — AB (ref 8.9–10.3)
CO2: 21 mmol/L — AB (ref 22–32)
CREATININE: 1.69 mg/dL — AB (ref 0.44–1.00)
Chloride: 108 mmol/L (ref 98–111)
GFR calc Af Amer: 33 mL/min — ABNORMAL LOW (ref >60)
GFR calc non Af Amer: 29 mL/min — ABNORMAL LOW (ref >60)
GLUCOSE: 89 mg/dL (ref 70–99)
Potassium: 4.4 mmol/L (ref 3.5–5.1)
Sodium: 135 mmol/L (ref 135–145)

## 2018-02-11 LAB — CBC
HCT: 31.4 % — ABNORMAL LOW (ref 36.0–46.0)
HEMOGLOBIN: 9.2 g/dL — AB (ref 12.0–15.0)
MCH: 29.7 pg (ref 26.0–34.0)
MCHC: 29.3 g/dL — AB (ref 30.0–36.0)
MCV: 101.3 fL — ABNORMAL HIGH (ref 78.0–100.0)
Platelets: 121 10*3/uL — ABNORMAL LOW (ref 150–400)
RBC: 3.1 MIL/uL — ABNORMAL LOW (ref 3.87–5.11)
RDW: 15.6 % — AB (ref 11.5–15.5)
WBC: 7.1 10*3/uL (ref 4.0–10.5)

## 2018-02-11 LAB — PROTIME-INR
INR: 1.07
PROTHROMBIN TIME: 13.8 s (ref 11.4–15.2)

## 2018-02-11 MED ORDER — PANTOPRAZOLE SODIUM 40 MG PO TBEC
40.0000 mg | DELAYED_RELEASE_TABLET | Freq: Every day | ORAL | Status: DC
  Administered 2018-02-11 – 2018-02-12 (×2): 40 mg via ORAL
  Filled 2018-02-11 (×2): qty 1

## 2018-02-11 MED ORDER — CALCITRIOL 0.25 MCG PO CAPS: 0.5000 ug | ORAL_CAPSULE | ORAL | Status: DC

## 2018-02-11 MED ORDER — ACETAMINOPHEN 325 MG PO TABS
650.0000 mg | ORAL_TABLET | Freq: Four times a day (QID) | ORAL | Status: DC | PRN
  Administered 2018-02-11 – 2018-02-12 (×2): 650 mg via ORAL
  Filled 2018-02-11 (×2): qty 2

## 2018-02-11 MED ORDER — ALLOPURINOL 100 MG PO TABS
100.0000 mg | ORAL_TABLET | Freq: Two times a day (BID) | ORAL | Status: DC
  Administered 2018-02-11 – 2018-02-12 (×3): 100 mg via ORAL
  Filled 2018-02-11 (×3): qty 1

## 2018-02-11 MED ORDER — CALCITRIOL 0.25 MCG PO CAPS
0.2500 ug | ORAL_CAPSULE | ORAL | Status: DC
  Administered 2018-02-11: 0.25 ug via ORAL
  Filled 2018-02-11: qty 1

## 2018-02-11 MED ORDER — PREDNISONE 5 MG PO TABS
5.0000 mg | ORAL_TABLET | Freq: Every day | ORAL | Status: DC
  Administered 2018-02-11 – 2018-02-12 (×2): 5 mg via ORAL
  Filled 2018-02-11 (×2): qty 1

## 2018-02-11 MED ORDER — CALCITRIOL 0.25 MCG PO CAPS: 0.2500 ug | ORAL_CAPSULE | ORAL | Status: DC

## 2018-02-11 NOTE — ED Notes (Signed)
While speaking with the EDP, pt became unresponsive, apneic, and extremely bradycardic in the 20-30s. RT to bedside to assist ventilations with BVM. 2nd IV placed, atropine given. Pt then regained consciousness.

## 2018-02-11 NOTE — Progress Notes (Signed)
Brendia Sacks 9:50 AM  Subjective: Patient has not had a bowel movement since she has been here today has had some nonbloody bowel movements and minimal abdominal cramps is tolerating clear liquids and has no new complaints answered all of her questions including Crohn's and diverticuli discussed her previous may colonoscopy Objective: Signs stable afebrile no acute distress abdomen is soft nontender hemoglobin slight drop to 9.2 BUN and creatinine decreased  Assessment: Probable diverticular bleeding  Plan: Continue clear liquids today consider Procrit or similar if she drops much further otherwise can probably advance diet tomorrow if no signs of further bleeding but if bleeding restarts consider nuclear bleeding scan first  [facility]  Pager [phone] After 5PM or if no answer call [phone]

## 2018-02-11 NOTE — Progress Notes (Signed)
PROGRESS NOTE        PATIENT DETAILS Name: [name] Age: 77 y.o. Sex: female Date of Birth: 12/02/1940 Admit Date: 02/10/2018 Admitting Physician Cristal Deer, MD EXN:[id number] Koren Shiver, DO  Brief Narrative: Patient is a 77 y.o. 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 fib on anticoagulation with Coumadin, Crohn's disease, lupus presenting with painless hematochezia.  Thought to have diverticular bleeding and admitted to the hospitalist service.  See below for further details  Subjective: Brown stools this morning.  Denies any abdominal pain.  Assessment/Plan: Lower GI bleeding with acute blood loss anemia: Suspect diverticular bleeding-hemoglobin has slowly trended down to 9.2-but thankfully hematochezia seems to have resolved.  Plans are to continue to monitor closely-slowly advance diet-and if no longer bleeding-home most likely tomorrow.  If she rebleeds significantly-we will need a bleeding scan.  She is a [employer] adamantly refuses blood products if she requires transfusion  Paroxysmal atrial fibrillation: Sinus rhythm-continue metoprolol-Coumadin on hold-INR subtherapeutic.  Will discuss with GI regarding optimal timing of resumption of Coumadin.  CKD 4 with stage renal transplant: Creatinine close to usual baseline-resume metoprolol, continue with Prograf and mycophenolate  Hypothyroidism: Continue with levothyroxine  History of Crohn's disease: Appears quiescent  DVT Prophylaxis: SCD's  Code Status: Full code   Family Communication: None at bedside  Disposition Plan: Remain inpatient-hopefully home tomorrow  Antimicrobial agents: Anti-infectives (From admission, onward)   None      Procedures: None  CONSULTS:  GI  Time spent: 25- minutes-Greater than 50% of this time was spent in counseling, explanation of diagnosis, planning of further management, and  coordination of care.  MEDICATIONS: Scheduled Meds: . gabapentin  200 mg Oral QHS  . levothyroxine  100 mcg Oral QAC breakfast  . magnesium oxide  400 mg Oral BID  . Melatonin  4.5 mg Oral QHS  . metoprolol tartrate  50 mg Oral BID  . mycophenolate  360 mg Oral BID  . [START ON 02/14/2018] pantoprazole  40 mg Intravenous Q12H  . tacrolimus  3 mg Oral BID   Continuous Infusions: . sodium chloride    . sodium chloride 50 mL/hr at 02/11/18 0700   PRN Meds:.sodium chloride, polyethylene glycol, polyvinyl alcohol   PHYSICAL EXAM: Vital signs: Vitals:   02/10/18 1625 02/10/18 2249 02/11/18 0555 02/11/18 0824  BP: (!) 143/85 134/82 127/65 131/76  Pulse: 75 68 70 81  Resp: 19 17 18    Temp: 98.2 F (36.8 C)  98 F (36.7 C)   TempSrc: Oral     SpO2:  100% 99%   Weight: 62.2 kg     Height: 5' 3"  (1.6 m)      Filed Weights   02/10/18 0725 02/10/18 1625  Weight: 61.2 kg 62.2 kg   Body mass index is 24.29 kg/m.   General appearance :Awake, alert, not in any distress. Speech Clear. Not toxic Looking Eyes:Pink conjunctiva HEENT: Atraumatic and Normocephalic Neck: supple, Resp:Good air entry bilaterally, no added sounds  CVS: S1 S2 regular, no murmurs.  GI: Bowel sounds present, Non tender and not distended with no gaurding, rigidity or rebound.No organomegaly Extremities: B/L Lower Ext shows no edema, both legs are warm to touch Neurology:  speech clear,Non focal, sensation is grossly intact. Psychiatric: Normal judgment and insight. Alert and oriented x 3. Normal mood. Musculoskeletal:No digital cyanosis Skin:No  Rash, warm and dry Wounds:N/A  I have personally reviewed following labs and imaging studies  LABORATORY DATA: CBC: Recent Labs  Lab 02/08/18 1300 02/10/18 0811 02/10/18 1633 02/11/18 0813  WBC  --  6.5 7.9 7.1  NEUTROABS  --  3.7  --   --   HGB 11.8* 11.2* 9.7* 9.2*  HCT  --  36.4 33.0* 31.4*  MCV  --  96.8 100.6* 101.3*  PLT  --  123* 117* 121*     Basic Metabolic Panel: Recent Labs  Lab 02/10/18 0811 02/11/18 0813  NA 138 135  K 4.8 4.4  CL 106 108  CO2 24 21*  GLUCOSE 103* 89  BUN 54* 34*  CREATININE 1.80* 1.69*  CALCIUM 9.1 8.6*    GFR: Estimated Creatinine Clearance: 24.2 mL/min (A) (by C-G formula based on SCr of 1.69 mg/dL (H)).  Liver Function Tests: No results for input(s): AST, ALT, ALKPHOS, BILITOT, PROT, ALBUMIN in the last 168 hours. No results for input(s): LIPASE, AMYLASE in the last 168 hours. No results for input(s): AMMONIA in the last 168 hours.  Coagulation Profile: Recent Labs  Lab 02/08/18 1311 02/10/18 0811 02/11/18 0652  INR 1.44 1.41 1.07    Cardiac Enzymes: No results for input(s): CKTOTAL, CKMB, CKMBINDEX, TROPONINI in the last 168 hours.  BNP (last 3 results) No results for input(s): PROBNP in the last 8760 hours.  HbA1C: No results for input(s): HGBA1C in the last 72 hours.  CBG: No results for input(s): GLUCAP in the last 168 hours.  Lipid Profile: No results for input(s): CHOL, HDL, LDLCALC, TRIG, CHOLHDL, LDLDIRECT in the last 72 hours.  Thyroid Function Tests: No results for input(s): TSH, T4TOTAL, FREET4, T3FREE, THYROIDAB in the last 72 hours.  Anemia Panel: No results for input(s): VITAMINB12, FOLATE, FERRITIN, TIBC, IRON, RETICCTPCT in the last 72 hours.  Urine analysis:    Component Value Date/Time   COLORURINE YELLOW 11/23/2016 0046   APPEARANCEUR CLEAR 11/23/2016 0046   LABSPEC 1.014 11/23/2016 0046   PHURINE 6.0 11/23/2016 0046   GLUCOSEU NEGATIVE 11/23/2016 0046   HGBUR NEGATIVE 11/23/2016 0046   BILIRUBINUR neg 12/04/2017 1321   KETONESUR NEGATIVE 11/23/2016 0046   PROTEINUR Negative 12/04/2017 1321   PROTEINUR NEGATIVE 11/23/2016 0046   UROBILINOGEN 0.2 12/04/2017 1321   UROBILINOGEN 0.2 08/23/2014 0156   NITRITE neg 12/04/2017 1321   NITRITE NEGATIVE 11/23/2016 0046   LEUKOCYTESUR Negative 12/04/2017 1321    Sepsis Labs: Lactic Acid,  Venous    Component Value Date/Time   LATICACIDVEN 0.81 07/05/2017 0021    MICROBIOLOGY: No results found for this or any previous visit (from the past 240 hour(s)).  RADIOLOGY STUDIES/RESULTS: Ct Abdomen Pelvis Wo Contrast  Result Date: 02/10/2018 CLINICAL DATA:  77 year old female with bleeding and passing clots from rectum. Symptoms since yesterday. Renal insufficiency precludes IV contrast currently. EXAM: CT ABDOMEN AND PELVIS WITHOUT CONTRAST TECHNIQUE: Multidetector CT imaging of the abdomen and pelvis was performed following the standard protocol without IV contrast. COMPARISON:  CT Abdomen and Pelvis 11/23/2016 and earlier. FINDINGS: Lower chest: Mild extrapleural fat is stable. No pleural effusion. Stable mild lung base scarring. Calcified coronary artery atherosclerosis. No pericardial effusion. Hepatobiliary: Surgically absent gallbladder. Stable and negative liver. Pancreas: Atrophied, stable. Spleen: Negative. Adrenals/Urinary Tract: Normal right adrenal gland. The left adrenal is diminutive or absent. Both native kidneys are surgically absent with numerous renal fossa surgical clips. 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 Unremarkable urinary bladder.  Stomach/Bowel: Retained stool and hyperdense material in the distal sigmoid colon. Somewhat featureless appearance of the sigmoid and rectum but no surrounding mesenteric inflammation. Occasional sigmoid and distal descending colon diverticula. Similar featureless appearance of the splenic flexure with retained stool. Redundant hepatic flexure and right colon partially contained within a chronic broad-based right abdominal hernia. There is diverticulosis at the hepatic flexure. Proximal transverse colon is mildly gas distended. No colonic mesenteric inflammation. No dilated small bowel.  Decompressed stomach and duodenum. No abdominal free air, free fluid. Vascular/Lymphatic:  Severe diffuse aortic and iliofemoral calcified atherosclerosis. 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 IVC filter in place and appears stable. Vascular patency is not evaluated in the absence of IV contrast. No lymphadenopathy. Reproductive: Negative. Other: No pelvic free fluid. Musculoskeletal: Advanced chronic disc and endplate degeneration [id number]. 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 Severe chronic degenerative changes at the L1-L2 posterior elements. Stable visualized osseous structures. IMPRESSION: 1. Hyperdense material in the distal sigmoid colon which could be hemorrhage. Somewhat featureless appearance of the large bowel from the mid transverse distally, a mild colitis is difficult to exclude. However, no focal bowel inflammation is identified. No free fluid. 2. 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 Electronically Signed   By: Genevie Ann M.D.   On: 02/10/2018 09:52     LOS: 1 day   Oren Binet, MD  [facility]  If 7PM-7AM, please contact night-coverage  Please page via [email or web address] [email or web address]-click on MD name and type text message  02/11/2018, 11:29 AM

## 2018-02-11 NOTE — Progress Notes (Signed)
Patient requested tylenol for a headache. MD notified.

## 2018-02-12 DIAGNOSIS — K922 Gastrointestinal hemorrhage, unspecified: Secondary | ICD-10-CM

## 2018-02-12 DIAGNOSIS — D62 Acute posthemorrhagic anemia: Secondary | ICD-10-CM

## 2018-02-12 DIAGNOSIS — K50918 Crohn's disease, unspecified, with other complication: Secondary | ICD-10-CM

## 2018-02-12 LAB — CBC
HEMATOCRIT: 30.4 % — AB (ref 36.0–46.0)
Hemoglobin: 8.9 g/dL — ABNORMAL LOW (ref 12.0–15.0)
MCH: 29.4 pg (ref 26.0–34.0)
MCHC: 29.3 g/dL — AB (ref 30.0–36.0)
MCV: 100.3 fL — ABNORMAL HIGH (ref 78.0–100.0)
Platelets: 135 10*3/uL — ABNORMAL LOW (ref 150–400)
RBC: 3.03 MIL/uL — ABNORMAL LOW (ref 3.87–5.11)
RDW: 15.6 % — AB (ref 11.5–15.5)
WBC: 6.2 10*3/uL (ref 4.0–10.5)

## 2018-02-12 MED ORDER — WARFARIN SODIUM 2.5 MG PO TABS: 1.2500 mg | ORAL_TABLET | ORAL | Status: DC

## 2018-02-12 NOTE — Discharge Summary (Addendum)
PATIENT DETAILS Name: [name] Age: 77 y.o. Sex: female Date of Birth: Feb 24, 1941 MRN: [id number]. Admitting Physician: Cristal Deer, MD FVO:[id number] Koren Shiver, DO  Admit Date: 02/10/2018 Discharge date: 02/12/2018  Recommendations for Outpatient Follow-up:  1. Follow up with PCP in 1-2 weeks 2. Please obtain BMP/CBC in one week 3. Coumadin to be restarted on 10/8-please check INR in the next 2-3 days.  Admitted From:  Home  Disposition: [place]: No  Equipment/Devices: None  Discharge Condition: Stable  CODE STATUS: FULL CODE  Diet recommendation:  Heart Healthy  Brief Summary: See H&P, Labs, Consult and Test reports for all details in brief, Patient is a 77 y.o. 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 fib on anticoagulation with Coumadin, Crohn's disease, lupus presenting with painless hematochezia.  Thought to have diverticular bleeding and admitted to the hospitalist service.  See below for further details  Brief Hospital Course: Lower GI bleeding with acute blood loss anemia: High suspicion for diverticular bleeding-hemoglobin somewhat stable over the past few days-brown stools yesterday-no hematochezia since admission.  Followed closely by GI-signs of bleeding seems to have resolved-suspect stable to be discharged home tomorrow.  Recommendations are to resume Coumadin starting tomorrow.    She is a [employer] adamantly refuses blood products if she requires transfusion.  She refuses autologous blood for nuclear medicine bleeding scan.  Paroxysmal atrial fibrillation: Sinus rhythm-continue metoprolol-when on hold-recommendations from gastroenterology are to resume starting on 10/8.  Patient claims that her nephrologist and her PCP manage INR and adjust dosing of Coumadin accordingly-she was advised to get in touch with them over the next few days for further instructions.  CKD 4  with stage renal transplant: Creatinine close to usual baseline-resume metoprolol, continue with Prograf and mycophenolate  Hypothyroidism: Continue with levothyroxine  History of Crohn's disease: Appears quiescent  Procedures/Studies: None  Discharge Diagnoses:  Principal Problem:   GI bleed Active Problems:   Hypersomnia with sleep apnea   Atrial fibrillation (HCC)   CKD (chronic kidney disease) stage 3, GFR 30-59 ml/min (HCC)   History of renal transplant   HTN (hypertension)   Hypothyroidism   Crohn's disease (HCC)   History of DVT (deep vein thrombosis)   Lower GI bleed   Discharge Instructions:  Activity:  As tolerated   Discharge Instructions    Call MD for:   Complete by:  As directed    Melena and hematochezia   Diet - low sodium heart healthy   Complete by:  As directed    Full liquid diet-slowly advance to a soft diet   Discharge instructions   Complete by:  As directed    Follow with Primary MD  Carollee Herter, Alferd Apa, DO in 1 week  Follow with [facility] in 2 weeks  Start coumadin on 10/8  Please get a complete blood count and chemistry panel checked by your Primary MD at your next visit, and again as instructed by your Primary MD.  Get Medicines reviewed and adjusted: Please take all your medications with you for your next visit with your Primary MD  Laboratory/radiological data: Please request your Primary MD to go over all hospital tests and procedure/radiological results at the follow up, please ask your Primary MD to get all Hospital records sent to his/her office.  In some cases, they will be blood work, cultures and biopsy results pending at the time of your discharge. Please request that your primary care M.D.  follows up on these results.  Also Note the following: If you experience worsening of your admission symptoms, develop shortness of breath, life threatening emergency, suicidal or homicidal thoughts you must seek medical attention  immediately by calling 911 or calling your MD immediately  if symptoms less severe.  You must read complete instructions/literature along with all the possible adverse reactions/side effects for all the Medicines you take and that have been prescribed to you. Take any new Medicines after you have completely understood and accpet all the possible adverse reactions/side effects.   Do not drive when taking Pain medications or sleeping medications (Benzodaizepines)  Do not take more than prescribed Pain, Sleep and Anxiety Medications. It is not advisable to combine anxiety,sleep and pain medications without talking with your primary care practitioner  Special Instructions: If you have smoked or chewed Tobacco  in the last 2 yrs please stop smoking, stop any regular Alcohol  and or any Recreational drug use.  Wear Seat belts while driving.  Please note: You were cared for by a hospitalist during your hospital stay. Once you are discharged, your primary care physician will handle any further medical issues. Please note that NO REFILLS for any discharge medications will be authorized once you are discharged, as it is imperative that you return to your primary care physician (or establish a relationship with a primary care physician if you do not have one) for your post hospital discharge needs so that they can reassess your need for medications and monitor your lab values.   Increase activity slowly   Complete by:  As directed      Allergies as of 02/12/2018      Reactions   Amoxicillin Anaphylaxis   Ampicillin Anaphylaxis   Ciprofloxacin Swelling, Other (See Comments)   "of my throat"   Erythromycin Anaphylaxis   Penicillins Anaphylaxis, Other (See Comments)   Has patient had a PCN reaction causing immediate rash, facial/tongue/throat swelling, SOB or lightheadedness with hypotension: Yes Has patient had a PCN reaction causing severe rash involving mucus membranes or skin necrosis: No Has  patient had a PCN reaction that required hospitalization: Yes Has patient had a PCN reaction occurring within the last 10 years: Yes If all of the above answers are "NO", then may proceed with Cephalosporin use.   Sulfa Antibiotics Itching   Sulfamethoxazole-trimethoprim Itching   Tetracycline Anaphylaxis   Labetalol Nausea And Vomiting      Medication List    TAKE these medications   acetaminophen 325 MG tablet Commonly known as:  TYLENOL Take 650 mg by mouth every 6 (six) hours as needed for mild pain.   allopurinol 100 MG tablet Commonly known as:  ZYLOPRIM Take 1 tablet (100 mg total) by mouth 2 (two) times daily.   BLINK TEARS 0.25 % Gel Generic drug:  Polyethylene Glycol 400 Place 1 drop into both eyes 2 (two) times daily as needed (for dry eyes).   calcitRIOL 0.25 MCG capsule Commonly known as:  ROCALTROL Take 0.25-0.5 mcg by mouth See admin instructions. Take 0.5 mcg by mouth daily on Monday, Wednesday, Friday and Take 0.25 mcg by mouth daily on all other days   epoetin alfa 40000 UNIT/ML injection Commonly known as:  EPOGEN,PROCRIT Inject 40,000 Units into the skin every 21 ( twenty-one) days.   gabapentin 100 MG capsule Commonly known as:  NEURONTIN Take 2 capsules (200 mg total) by mouth at bedtime.   levothyroxine 100 MCG tablet Commonly known as:  SYNTHROID, LEVOTHROID Take 1 tablet (  100 mcg total) by mouth daily.   MAGNESIUM-OXIDE 400 (241.3 Mg) MG tablet Generic drug:  magnesium oxide Take 400 mg by mouth 2 (two) times daily.   Melatonin 5 MG Caps Take 5 mg by mouth daily.   metoprolol tartrate 50 MG tablet Commonly known as:  LOPRESSOR Take 50 mg by mouth 2 (two) times daily.   mycophenolate 180 MG EC tablet Commonly known as:  MYFORTIC Take 360 mg by mouth 2 (two) times daily.   omeprazole 20 MG capsule Commonly known as:  PRILOSEC Take 20 mg by mouth daily.   polyethylene glycol packet Commonly known as:  MIRALAX / GLYCOLAX Take 17 g by  mouth daily as needed for moderate constipation.   predniSONE 5 MG tablet Commonly known as:  DELTASONE Take 5 mg by mouth daily.   PROBIOTIC PO Take 2 tablets by mouth daily.   tacrolimus 1 MG capsule Commonly known as:  PROGRAF Take 3 mg by mouth 2 (two) times daily.   warfarin 2.5 MG tablet Commonly known as:  COUMADIN Take 0.5-1 tablets (1.25-2.5 mg total) by mouth See admin instructions. Take 2.5 mg by mouth daily on Monday, Wednesday, Friday and Take 1.25 mg by mouth daily on all other days Start taking on:  02/13/2018      Follow-up Information    Ann Held, DO. Schedule an appointment as soon as possible for a visit in 1 week(s).   Specialty:  Family Medicine Contact information: [address] [place] [address] [phone]        Laurence Spates, MD. Schedule an appointment as soon as possible for a visit in 2 week(s).   Specialty:  Gastroenterology Contact information: [address] [place] [address] [phone]          Allergies  Allergen Reactions  . Amoxicillin Anaphylaxis  . Ampicillin Anaphylaxis  . Ciprofloxacin Swelling and Other (See Comments)    "of my throat"  . Erythromycin Anaphylaxis  . Penicillins Anaphylaxis and Other (See Comments)    Has patient had a PCN reaction causing immediate rash, facial/tongue/throat swelling, SOB or lightheadedness with hypotension: Yes Has patient had a PCN reaction causing severe rash involving mucus membranes or skin necrosis: No Has patient had a PCN reaction that required hospitalization: Yes Has patient had a PCN reaction occurring within the last 10 years: Yes If all of the above answers are "NO", then may proceed with Cephalosporin use.  . Sulfa Antibiotics Itching  . Sulfamethoxazole-Trimethoprim Itching  . Tetracycline Anaphylaxis  . Labetalol Nausea And Vomiting    Consultations:   GI   Other Procedures/Studies: Ct Abdomen Pelvis Wo  Contrast  Result Date: 02/10/2018 CLINICAL DATA:  77 year old female with bleeding and passing clots from rectum. Symptoms since yesterday. Renal insufficiency precludes IV contrast currently. EXAM: CT ABDOMEN AND PELVIS WITHOUT CONTRAST TECHNIQUE: Multidetector CT imaging of the abdomen and pelvis was performed following the standard protocol without IV contrast. COMPARISON:  CT Abdomen and Pelvis 11/23/2016 and earlier. FINDINGS: Lower chest: Mild extrapleural fat is stable. No pleural effusion. Stable mild lung base scarring. Calcified coronary artery atherosclerosis. No pericardial effusion. Hepatobiliary: Surgically absent gallbladder. Stable and negative liver. Pancreas: Atrophied, stable. Spleen: Negative. Adrenals/Urinary Tract: Normal right adrenal gland. The left adrenal is diminutive or absent. Both native kidneys are surgically absent with numerous renal fossa surgical clips. 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 Unremarkable urinary bladder. Stomach/Bowel: Retained  stool and hyperdense material in the distal sigmoid colon. Somewhat featureless appearance of the sigmoid and rectum but no surrounding mesenteric inflammation. Occasional sigmoid and distal descending colon diverticula. Similar featureless appearance of the splenic flexure with retained stool. Redundant hepatic flexure and right colon partially contained within a chronic broad-based right abdominal hernia. There is diverticulosis at the hepatic flexure. Proximal transverse colon is mildly gas distended. No colonic mesenteric inflammation. No dilated small bowel.  Decompressed stomach and duodenum. No abdominal free air, free fluid. Vascular/Lymphatic: Severe diffuse aortic and iliofemoral calcified atherosclerosis. 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 IVC filter in place and appears stable. Vascular patency is not evaluated in the absence of IV contrast. No  lymphadenopathy. Reproductive: Negative. Other: No pelvic free fluid. Musculoskeletal: Advanced chronic disc and endplate degeneration T01-X79. 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 Severe chronic degenerative changes at the L1-L2 posterior elements. Stable visualized osseous structures. IMPRESSION: 1. Hyperdense material in the distal sigmoid colon which could be hemorrhage. Somewhat featureless appearance of the large bowel from the mid transverse distally, a mild colitis is difficult to exclude. However, no focal bowel inflammation is identified. No free fluid. 2. 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 Electronically Signed   By: Genevie Ann M.D.   On: 02/10/2018 09:52      TODAY-DAY OF DISCHARGE:  Subjective:   [name] today has no headache,no chest abdominal pain,no new weakness tingling or numbness, feels much better wants to go home today.   Objective:   Blood pressure (!) 144/88, pulse 62, temperature 98 F (36.7 C), temperature source Oral, resp. rate 17, height 5' 3"  (1.6 m), weight 62.2 kg, last menstrual period 03/05/2014, SpO2 100 %.  Intake/Output Summary (Last 24 hours) at 02/12/2018 0931 Last data filed at 02/11/2018 1528 Gross per 24 hour  Intake 382.76 ml  Output -  Net 382.76 ml   Filed Weights   02/10/18 0725 02/10/18 1625  Weight: 61.2 kg 62.2 kg    Exam: Awake Alert, Oriented *3, No new F.N deficits, Normal affect [place].AT,PERRAL Supple Neck,No JVD, No cervical lymphadenopathy appriciated.  Symmetrical Chest wall movement, Good air movement bilaterally, CTAB RRR,No Gallops,Rubs or new Murmurs, No Parasternal Heave +ve B.Sounds, Abd Soft, Non tender, No organomegaly appriciated, No rebound -guarding or rigidity. No Cyanosis, Clubbing or edema, No new Rash or  bruise   PERTINENT RADIOLOGIC STUDIES: Ct Abdomen Pelvis Wo Contrast  Result Date: 02/10/2018 CLINICAL DATA:  77 year old female with bleeding and passing clots from rectum. Symptoms since yesterday. Renal insufficiency precludes IV contrast currently. EXAM: CT ABDOMEN AND PELVIS WITHOUT CONTRAST TECHNIQUE: Multidetector CT imaging of the abdomen and pelvis was performed following the standard protocol without IV contrast. COMPARISON:  CT Abdomen and Pelvis 11/23/2016 and earlier. FINDINGS: Lower chest: Mild extrapleural fat is stable. No pleural effusion. Stable mild lung base scarring. Calcified coronary artery atherosclerosis. No pericardial effusion. Hepatobiliary: Surgically absent gallbladder. Stable and negative liver. Pancreas: Atrophied, stable. Spleen: Negative. Adrenals/Urinary Tract: Normal right adrenal gland. The left adrenal is diminutive or absent. Both native kidneys are surgically absent with numerous renal fossa surgical clips. 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 Unremarkable urinary bladder. Stomach/Bowel: Retained stool and hyperdense material in the distal sigmoid colon. Somewhat featureless appearance of the sigmoid and rectum but no surrounding mesenteric inflammation. Occasional sigmoid and distal descending colon diverticula.  Similar featureless appearance of the splenic flexure with retained stool. Redundant hepatic flexure and right colon partially contained within a chronic broad-based right abdominal hernia. There is diverticulosis at the hepatic flexure. Proximal transverse colon is mildly gas distended. No colonic mesenteric inflammation. No dilated small bowel.  Decompressed stomach and duodenum. No abdominal free air, free fluid. Vascular/Lymphatic: Severe diffuse aortic and iliofemoral calcified atherosclerosis. 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 IVC filter in place and appears stable. Vascular patency is  not evaluated in the absence of IV contrast. No lymphadenopathy. Reproductive: Negative. Other: No pelvic free fluid. Musculoskeletal: Advanced chronic disc and endplate degeneration D53-G99. 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 Severe chronic degenerative changes at the L1-L2 posterior elements. Stable visualized osseous structures. IMPRESSION: 1. Hyperdense material in the distal sigmoid colon which could be hemorrhage. Somewhat featureless appearance of the large bowel from the mid transverse distally, a mild colitis is difficult to exclude. However, no focal bowel inflammation is identified. No free fluid. 2. 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 Electronically Signed   By: Genevie Ann M.D.   On: 02/10/2018 09:52     PERTINENT LAB RESULTS: CBC: Recent Labs    02/11/18 0813 02/12/18 0500  WBC 7.1 6.2  HGB 9.2* 8.9*  HCT 31.4* 30.4*  PLT 121* 135*   CMET CMP     Component Value Date/Time   NA 135 02/11/2018 0813   K 4.4 02/11/2018 0813   CL 108 02/11/2018 0813   CO2 21 (L) 02/11/2018 0813   GLUCOSE 89 02/11/2018 0813   BUN 34 (H) 02/11/2018 0813   CREATININE 1.69 (H) 02/11/2018 0813   CALCIUM 8.6 (L) 02/11/2018 0813   CALCIUM 9.0 06/29/2017 1315   PROT 6.5 12/04/2017 1137   ALBUMIN 4.0 01/25/2018 1303   AST 13 12/04/2017 1137   ALT 11 12/04/2017 1137   ALKPHOS 147 (H) 12/04/2017 1137   BILITOT 0.5 12/04/2017 1137   GFRNONAA 29 (L) 02/11/2018 0813   GFRAA 33 (L) 02/11/2018 0813    GFR Estimated Creatinine Clearance: 24.2 mL/min (A) (by C-G formula based on SCr of 1.69 mg/dL (H)). No results for input(s): LIPASE, AMYLASE in the last 72 hours. No results for input(s): CKTOTAL, CKMB, CKMBINDEX, TROPONINI in the last 72 hours. Invalid input(s): POCBNP No results for  input(s): DDIMER in the last 72 hours. No results for input(s): HGBA1C in the last 72 hours. No results for input(s): CHOL, HDL, LDLCALC, TRIG, CHOLHDL, LDLDIRECT in the last 72 hours. No results for input(s): TSH, T4TOTAL, T3FREE, THYROIDAB in the last 72 hours.  Invalid input(s): FREET3 No results for input(s): VITAMINB12, FOLATE, FERRITIN, TIBC, IRON, RETICCTPCT in the last 72 hours. Coags: Recent Labs    02/10/18 0811 02/11/18 0652  INR 1.41 1.07   Microbiology: No results found for this or any previous visit (from the past 240 hour(s)).  FURTHER DISCHARGE INSTRUCTIONS:  Get Medicines reviewed and adjusted: Please take all your medications with you for your next visit with your Primary MD  Laboratory/radiological data: Please request your Primary MD to go over all hospital tests and procedure/radiological results at the follow up, please ask your Primary MD to get all Hospital records sent to his/her office.  In some cases, they will be blood work, cultures and biopsy results pending at the time of your discharge. Please request that your primary care M.D. goes through all  the records of your hospital data and follows up on these results.  Also Note the following: If you experience worsening of your admission symptoms, develop shortness of breath, life threatening emergency, suicidal or homicidal thoughts you must seek medical attention immediately by calling 911 or calling your MD immediately  if symptoms less severe.  You must read complete instructions/literature along with all the possible adverse reactions/side effects for all the Medicines you take and that have been prescribed to you. Take any new Medicines after you have completely understood and accpet all the possible adverse reactions/side effects.   Do not drive when taking Pain medications or sleeping medications (Benzodaizepines)  Do not take more than prescribed Pain, Sleep and Anxiety Medications. It is not  advisable to combine anxiety,sleep and pain medications without talking with your primary care practitioner  Special Instructions: If you have smoked or chewed Tobacco  in the last 2 yrs please stop smoking, stop any regular Alcohol  and or any Recreational drug use.  Wear Seat belts while driving.  Please note: You were cared for by a hospitalist during your hospital stay. Once you are discharged, your primary care physician will handle any further medical issues. Please note that NO REFILLS for any discharge medications will be authorized once you are discharged, as it is imperative that you return to your primary care physician (or establish a relationship with a primary care physician if you do not have one) for your post hospital discharge needs so that they can reassess your need for medications and monitor your lab values.  Total Time spent coordinating discharge including counseling, education and face to face time equals 35 minutes.  SignedOren Binet 02/12/2018 9:31 AM

## 2018-02-12 NOTE — Progress Notes (Signed)
[name] to be D/C' home, per MD order.  Discussed with the patient and all questions fully answered.  VSS, Skin clean, dry and intact without evidence of skin break down, no evidence of skin tears noted. IV catheter discontinued intact. Site without signs and symptoms of complications. Dressing and pressure applied.  An After Visit Summary was printed and given to the patient. Patient received prescription.  D/c education completed with patient/family including follow up instructions, medication list, d/c activities limitations if indicated, with other d/c instructions as indicated by MD - patient able to verbalize understanding, all questions fully answered.   Patient instructed to return to ED, call 911, or call MD for any changes in condition.   Patient escorted via [place], and D/C home via private auto.   Luellen Pucker RN 02/12/2018   2pm.

## 2018-02-12 NOTE — Progress Notes (Signed)
Subjective: The patient was seen and examined at bedside. She reports that last rectal bleeding episode was Saturday evening at 5 PM, she had a few brown stools yesterday. Denies abdominal pain.  Objective: Vital signs in last 24 hours: Temp:  [97.9 F (36.6 C)-98.5 F (36.9 C)] 98 F (36.7 C) (10/07 0800) Pulse Rate:  [62-74] 62 (10/07 0800) Resp:  [16-18] 17 (10/07 0524) BP: (134-144)/(60-88) 144/88 (10/07 0800) SpO2:  [98 %-100 %] 100 % (10/07 0800) Weight change:  Last BM Date: 02/11/18  PF:[id number] pallor GENERAL:noted in distress ABDOMEN:large ventral wall hernia and multiple well-healed surgical scars, normoactive bowel sounds, nontender EXTREMITIES:no deformity  Lab Results: Results for orders placed or performed during the hospital encounter of 02/10/18 (from the past 48 hour(s))  CBC     Status: Abnormal   Collection Time: 02/10/18  4:33 PM  Result Value Ref Range   WBC 7.9 4.0 - 10.5 K/uL   RBC 3.28 (L) 3.87 - 5.11 MIL/uL   Hemoglobin 9.7 (L) 12.0 - 15.0 g/dL   HCT 33.0 (L) 36.0 - 46.0 %   MCV 100.6 (H) 78.0 - 100.0 fL   MCH 29.6 26.0 - 34.0 pg   MCHC 29.4 (L) 30.0 - 36.0 g/dL   RDW 15.5 11.5 - 15.5 %   Platelets 117 (L) 150 - 400 K/uL    Comment: PLATELET COUNT CONFIRMED BY SMEAR Performed at [facility], [address], [place], [place] [address]   Protime-INR     Status: None   Collection Time: 02/11/18  6:52 AM  Result Value Ref Range   Prothrombin Time 13.8 11.4 - 15.2 seconds   INR 1.07     Comment: Performed at [facility] [address], [place], [place] [address]  Basic metabolic panel     Status: Abnormal   Collection Time: 02/11/18  8:13 AM  Result Value Ref Range   Sodium 135 135 - 145 mmol/L   Potassium 4.4 3.5 - 5.1 mmol/L   Chloride 108 98 - 111 mmol/L   CO2 21 (L) 22 - 32 mmol/L   Glucose, Bld 89 70 - 99 mg/dL   BUN 34 (H) 8 - 23 mg/dL   Creatinine, Ser 1.69 (H) 0.44 - 1.00 mg/dL   Calcium 8.6 (L) 8.9 - 10.3 mg/dL   GFR  calc non Af Amer 29 (L) >60 mL/min   GFR calc Af Amer 33 (L) >60 mL/min    Comment: (NOTE) The eGFR has been calculated using the CKD EPI equation. This calculation has not been validated in all clinical situations. eGFR's persistently <60 mL/min signify possible Chronic Kidney Disease.    Anion gap 6 5 - 15    Comment: Performed at [facility] [address] [place], [place] [address]  CBC     Status: Abnormal   Collection Time: 02/11/18  8:13 AM  Result Value Ref Range   WBC 7.1 4.0 - 10.5 K/uL   RBC 3.10 (L) 3.87 - 5.11 MIL/uL   Hemoglobin 9.2 (L) 12.0 - 15.0 g/dL   HCT 31.4 (L) 36.0 - 46.0 %   MCV 101.3 (H) 78.0 - 100.0 fL   MCH 29.7 26.0 - 34.0 pg   MCHC 29.3 (L) 30.0 - 36.0 g/dL   RDW 15.6 (H) 11.5 - 15.5 %   Platelets 121 (L) 150 - 400 K/uL    Comment: Performed at [facility], [place] [address] [place], [place] [address]  CBC  Status: Abnormal   Collection Time: 02/12/18  5:00 AM  Result Value Ref Range   WBC 6.2 4.0 - 10.5 K/uL   RBC 3.03 (L) 3.87 - 5.11 MIL/uL   Hemoglobin 8.9 (L) 12.0 - 15.0 g/dL   HCT 30.4 (L) 36.0 - 46.0 %   MCV 100.3 (H) 78.0 - 100.0 fL   MCH 29.4 26.0 - 34.0 pg   MCHC 29.3 (L) 30.0 - 36.0 g/dL   RDW 15.6 (H) 11.5 - 15.5 %   Platelets 135 (L) 150 - 400 K/uL    Comment: Performed at [facility], [place] [address], [place], [place] [address]    Studies/Results: Ct Abdomen Pelvis Wo Contrast  Result Date: 02/10/2018 CLINICAL DATA:  77 year old female with bleeding and passing clots from rectum. Symptoms since yesterday. Renal insufficiency precludes IV contrast currently. EXAM: CT ABDOMEN AND PELVIS WITHOUT CONTRAST TECHNIQUE: Multidetector CT imaging of the abdomen and pelvis was performed following the standard protocol without IV contrast. COMPARISON:  CT Abdomen and Pelvis 11/23/2016 and earlier. FINDINGS: Lower chest: Mild extrapleural fat is stable. No pleural effusion. Stable mild lung base scarring. Calcified coronary  artery atherosclerosis. No pericardial effusion. Hepatobiliary: Surgically absent gallbladder. Stable and negative liver. Pancreas: Atrophied, stable. Spleen: Negative. Adrenals/Urinary Tract: Normal right adrenal gland. The left adrenal is diminutive or absent. Both native kidneys are surgically absent with numerous renal fossa surgical clips. 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 Unremarkable urinary bladder. Stomach/Bowel: Retained stool and hyperdense material in the distal sigmoid colon. Somewhat featureless appearance of the sigmoid and rectum but no surrounding mesenteric inflammation. Occasional sigmoid and distal descending colon diverticula. Similar featureless appearance of the splenic flexure with retained stool. Redundant hepatic flexure and right colon partially contained within a chronic broad-based right abdominal hernia. There is diverticulosis at the hepatic flexure. Proximal transverse colon is mildly gas distended. No colonic mesenteric inflammation. No dilated small bowel.  Decompressed stomach and duodenum. No abdominal free air, free fluid. Vascular/Lymphatic: Severe diffuse aortic and iliofemoral calcified atherosclerosis. 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 IVC filter in place and appears stable. Vascular patency is not evaluated in the absence of IV contrast. No lymphadenopathy. Reproductive: Negative. Other: No pelvic free fluid. Musculoskeletal: Advanced chronic disc and endplate degeneration Y60-Y30. 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 Severe chronic degenerative changes at the L1-L2 posterior elements. Stable visualized osseous structures. IMPRESSION: 1. Hyperdense material in the distal sigmoid colon which could be hemorrhage. Somewhat featureless appearance of the large bowel from the mid transverse distally, a mild colitis is difficult to exclude. However, no focal bowel  inflammation is identified. No free fluid. 2. 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 Electronically Signed   By: Genevie Ann M.D.   On: 02/10/2018 09:52    Medications: I have reviewed the patient's current medications.  Assessment: Painless hematochezia likely related to diverticulosis Hemoglobin 9.7/9.2/8.9-no further rectal bleeding for over 36 hours Remains hemodynamically stable  Plan: Advance diet to regular food. Recommend high-fiber diet, increase intake of fruits, vegetables, whole grains, supplement with bulk  forming laxatives such as Metamucil/ Benefiber 1 tablespoon in 8 ounces of water on a regular basis. Plan to resume Coumadin tomorrow as an outpatient.  Discussed about possibility of re-bleeding. Discussed about options of bleeding scan and embolization if needed. As patient is a Restaurant manager, fast food, she  refuses to get autologous blood needed for NM Bleeding scan.    Ronnette Juniper 02/12/2018, 8:56 AM   Pager [phone] If no answer or after 5 PM call [phone]

## 2018-02-13 ENCOUNTER — Telehealth: Payer: Self-pay

## 2018-02-13 DIAGNOSIS — K921 Melena: Secondary | ICD-10-CM | POA: Diagnosis not present

## 2018-02-13 NOTE — Telephone Encounter (Signed)
TCM call made to patient. States she will be busy all week and will have to schedule follow up appointment with Dr. Carollee Herter later. Advised there were orders for her to have labs collected,patient states she will whe she goes to see Dr. Oletta Lamas.Marland Kitchen

## 2018-02-16 ENCOUNTER — Ambulatory Visit: Payer: Medicare Other

## 2018-02-16 ENCOUNTER — Ambulatory Visit
Admission: RE | Admit: 2018-02-16 | Discharge: 2018-02-16 | Disposition: A | Discharge status: Home / Self Care | Payer: Medicare Other | Source: Ambulatory Visit | Attending: Family Medicine | Admitting: Family Medicine

## 2018-02-16 DIAGNOSIS — I129 Hypertensive chronic kidney disease with stage 1 through stage 4 chronic kidney disease, or unspecified chronic kidney disease: Secondary | ICD-10-CM | POA: Diagnosis not present

## 2018-02-16 DIAGNOSIS — M109 Gout, unspecified: Secondary | ICD-10-CM | POA: Diagnosis not present

## 2018-02-16 DIAGNOSIS — Z7901 Long term (current) use of anticoagulants: Secondary | ICD-10-CM | POA: Diagnosis not present

## 2018-02-16 DIAGNOSIS — Z1231 Encounter for screening mammogram for malignant neoplasm of breast: Secondary | ICD-10-CM

## 2018-02-16 DIAGNOSIS — N189 Chronic kidney disease, unspecified: Secondary | ICD-10-CM | POA: Diagnosis not present

## 2018-02-16 DIAGNOSIS — E039 Hypothyroidism, unspecified: Secondary | ICD-10-CM | POA: Diagnosis not present

## 2018-02-16 DIAGNOSIS — N2581 Secondary hyperparathyroidism of renal origin: Secondary | ICD-10-CM | POA: Diagnosis not present

## 2018-02-16 DIAGNOSIS — N2589 Other disorders resulting from impaired renal tubular function: Secondary | ICD-10-CM | POA: Diagnosis not present

## 2018-02-16 DIAGNOSIS — D631 Anemia in chronic kidney disease: Secondary | ICD-10-CM | POA: Diagnosis not present

## 2018-02-16 DIAGNOSIS — Z940 Kidney transplant status: Secondary | ICD-10-CM | POA: Diagnosis not present

## 2018-02-16 DIAGNOSIS — N184 Chronic kidney disease, stage 4 (severe): Secondary | ICD-10-CM | POA: Diagnosis not present

## 2018-02-16 DIAGNOSIS — M329 Systemic lupus erythematosus, unspecified: Secondary | ICD-10-CM | POA: Diagnosis not present

## 2018-02-16 DIAGNOSIS — N390 Urinary tract infection, site not specified: Secondary | ICD-10-CM | POA: Diagnosis not present

## 2018-02-16 DIAGNOSIS — E782 Mixed hyperlipidemia: Secondary | ICD-10-CM | POA: Diagnosis not present

## 2018-02-16 DIAGNOSIS — Z8719 Personal history of other diseases of the digestive system: Secondary | ICD-10-CM | POA: Diagnosis not present

## 2018-02-21 DIAGNOSIS — Z940 Kidney transplant status: Secondary | ICD-10-CM | POA: Diagnosis not present

## 2018-02-21 DIAGNOSIS — Z7901 Long term (current) use of anticoagulants: Secondary | ICD-10-CM | POA: Diagnosis not present

## 2018-02-22 ENCOUNTER — Ambulatory Visit (HOSPITAL_COMMUNITY)
Admission: RE | Admit: 2018-02-22 | Discharge: 2018-02-22 | Disposition: A | Discharge status: Home / Self Care | Payer: Medicare Other | Source: Ambulatory Visit | Attending: Nephrology | Admitting: Nephrology

## 2018-02-22 VITALS — BP 164/73 | HR 67

## 2018-02-22 DIAGNOSIS — N183 Chronic kidney disease, stage 3 unspecified: Secondary | ICD-10-CM

## 2018-02-22 DIAGNOSIS — D631 Anemia in chronic kidney disease: Secondary | ICD-10-CM | POA: Diagnosis not present

## 2018-02-22 LAB — RENAL FUNCTION PANEL
Albumin: 3.6 g/dL (ref 3.5–5.0)
Anion gap: 9 (ref 5–15)
BUN: 35 mg/dL — AB (ref 8–23)
CHLORIDE: 105 mmol/L (ref 98–111)
CO2: 22 mmol/L (ref 22–32)
Calcium: 9 mg/dL (ref 8.9–10.3)
Creatinine, Ser: 1.92 mg/dL — ABNORMAL HIGH (ref 0.44–1.00)
GFR calc Af Amer: 29 mL/min — ABNORMAL LOW (ref >60)
GFR, EST NON AFRICAN AMERICAN: 25 mL/min — AB (ref >60)
Glucose, Bld: 99 mg/dL (ref 70–99)
POTASSIUM: 4.2 mmol/L (ref 3.5–5.1)
Phosphorus: 3.8 mg/dL (ref 2.5–4.6)
Sodium: 136 mmol/L (ref 135–145)

## 2018-02-22 LAB — IRON AND TIBC
IRON: 68 ug/dL (ref 28–170)
SATURATION RATIOS: 31 % (ref 10.4–31.8)
TIBC: 218 ug/dL — AB (ref 250–450)
UIBC: 150 ug/dL

## 2018-02-22 LAB — POCT HEMOGLOBIN-HEMACUE: HEMOGLOBIN: 9.9 g/dL — AB (ref 12.0–15.0)

## 2018-02-22 LAB — FERRITIN: FERRITIN: 612 ng/mL — AB (ref 11–307)

## 2018-02-22 MED ORDER — EPOETIN ALFA 10000 UNIT/ML IJ SOLN: 40000.0000 [IU] | INTRAMUSCULAR | Status: DC

## 2018-02-22 MED ORDER — EPOETIN ALFA 40000 UNIT/ML IJ SOLN
INTRAMUSCULAR | Status: AC
  Administered 2018-02-22: 40000 [IU]
  Filled 2018-02-22: qty 1

## 2018-02-27 DIAGNOSIS — Z23 Encounter for immunization: Secondary | ICD-10-CM | POA: Diagnosis not present

## 2018-02-28 ENCOUNTER — Other Ambulatory Visit (HOSPITAL_COMMUNITY): Payer: Self-pay | Admitting: *Deleted

## 2018-03-01 ENCOUNTER — Ambulatory Visit (HOSPITAL_COMMUNITY)
Admission: RE | Admit: 2018-03-01 | Discharge: 2018-03-01 | Disposition: A | Discharge status: Home / Self Care | Payer: Medicare Other | Source: Ambulatory Visit | Attending: Nephrology | Admitting: Nephrology

## 2018-03-01 VITALS — BP 140/65 | HR 69 | Temp 98.6°F | Resp 20

## 2018-03-01 DIAGNOSIS — N183 Chronic kidney disease, stage 3 unspecified: Secondary | ICD-10-CM

## 2018-03-01 DIAGNOSIS — N189 Chronic kidney disease, unspecified: Secondary | ICD-10-CM | POA: Insufficient documentation

## 2018-03-01 DIAGNOSIS — D631 Anemia in chronic kidney disease: Secondary | ICD-10-CM | POA: Insufficient documentation

## 2018-03-01 LAB — POCT HEMOGLOBIN-HEMACUE: Hemoglobin: 10.4 g/dL — ABNORMAL LOW (ref 12.0–15.0)

## 2018-03-01 MED ORDER — SODIUM CHLORIDE 0.9 % IV SOLN
510.0000 mg | Freq: Once | INTRAVENOUS | Status: AC
  Administered 2018-03-01: 510 mg via INTRAVENOUS
  Filled 2018-03-01: qty 17

## 2018-03-01 MED ORDER — EPOETIN ALFA 10000 UNIT/ML IJ SOLN: 40000.0000 [IU] | INTRAMUSCULAR | Status: DC

## 2018-03-01 MED ORDER — EPOETIN ALFA 40000 UNIT/ML IJ SOLN
INTRAMUSCULAR | Status: AC
  Administered 2018-03-01: 40000 [IU] via SUBCUTANEOUS
  Filled 2018-03-01: qty 1

## 2018-03-08 ENCOUNTER — Encounter (HOSPITAL_COMMUNITY)
Admission: RE | Admit: 2018-03-08 | Discharge: 2018-03-08 | Disposition: A | Discharge status: Home / Self Care | Payer: Medicare Other | Source: Ambulatory Visit | Attending: Nephrology | Admitting: Nephrology

## 2018-03-08 VITALS — BP 144/72 | HR 73 | Temp 98.2°F | Resp 20

## 2018-03-08 DIAGNOSIS — N183 Chronic kidney disease, stage 3 unspecified: Secondary | ICD-10-CM

## 2018-03-08 DIAGNOSIS — D631 Anemia in chronic kidney disease: Secondary | ICD-10-CM | POA: Insufficient documentation

## 2018-03-08 LAB — POCT HEMOGLOBIN-HEMACUE: Hemoglobin: 11.2 g/dL — ABNORMAL LOW (ref 12.0–15.0)

## 2018-03-08 MED ORDER — EPOETIN ALFA 40000 UNIT/ML IJ SOLN
INTRAMUSCULAR | Status: AC
  Administered 2018-03-08: 40000 [IU]
  Filled 2018-03-08: qty 1

## 2018-03-08 MED ORDER — EPOETIN ALFA 10000 UNIT/ML IJ SOLN: 40000.0000 [IU] | INTRAMUSCULAR | Status: DC

## 2018-03-15 ENCOUNTER — Encounter (HOSPITAL_COMMUNITY): Payer: Medicare Other

## 2018-03-22 ENCOUNTER — Ambulatory Visit (HOSPITAL_COMMUNITY)
Admission: RE | Admit: 2018-03-22 | Discharge: 2018-03-22 | Disposition: A | Discharge status: Home / Self Care | Payer: Medicare Other | Source: Ambulatory Visit | Attending: Nephrology | Admitting: Nephrology

## 2018-03-22 VITALS — BP 180/83 | HR 74 | Temp 98.0°F | Resp 20

## 2018-03-22 DIAGNOSIS — N183 Chronic kidney disease, stage 3 unspecified: Secondary | ICD-10-CM

## 2018-03-22 LAB — POCT HEMOGLOBIN-HEMACUE: Hemoglobin: 13.4 g/dL (ref 12.0–15.0)

## 2018-03-22 MED ORDER — EPOETIN ALFA 10000 UNIT/ML IJ SOLN: 40000.0000 [IU] | INTRAMUSCULAR | Status: DC

## 2018-03-22 NOTE — Progress Notes (Signed)
Pt reports she has nephrology appt Monday and will have remainder of labs drawn then.

## 2018-03-26 DIAGNOSIS — N184 Chronic kidney disease, stage 4 (severe): Secondary | ICD-10-CM | POA: Diagnosis not present

## 2018-03-26 DIAGNOSIS — M329 Systemic lupus erythematosus, unspecified: Secondary | ICD-10-CM | POA: Diagnosis not present

## 2018-03-26 DIAGNOSIS — E782 Mixed hyperlipidemia: Secondary | ICD-10-CM | POA: Diagnosis not present

## 2018-03-26 DIAGNOSIS — K219 Gastro-esophageal reflux disease without esophagitis: Secondary | ICD-10-CM | POA: Diagnosis not present

## 2018-03-26 DIAGNOSIS — Z7901 Long term (current) use of anticoagulants: Secondary | ICD-10-CM | POA: Diagnosis not present

## 2018-03-26 DIAGNOSIS — Z940 Kidney transplant status: Secondary | ICD-10-CM | POA: Diagnosis not present

## 2018-03-26 DIAGNOSIS — E039 Hypothyroidism, unspecified: Secondary | ICD-10-CM | POA: Diagnosis not present

## 2018-03-26 DIAGNOSIS — N189 Chronic kidney disease, unspecified: Secondary | ICD-10-CM | POA: Diagnosis not present

## 2018-03-26 DIAGNOSIS — M109 Gout, unspecified: Secondary | ICD-10-CM | POA: Diagnosis not present

## 2018-03-26 DIAGNOSIS — N2581 Secondary hyperparathyroidism of renal origin: Secondary | ICD-10-CM | POA: Diagnosis not present

## 2018-03-26 DIAGNOSIS — D631 Anemia in chronic kidney disease: Secondary | ICD-10-CM | POA: Diagnosis not present

## 2018-03-26 DIAGNOSIS — N2589 Other disorders resulting from impaired renal tubular function: Secondary | ICD-10-CM | POA: Diagnosis not present

## 2018-03-26 DIAGNOSIS — Z8719 Personal history of other diseases of the digestive system: Secondary | ICD-10-CM | POA: Diagnosis not present

## 2018-03-26 DIAGNOSIS — I129 Hypertensive chronic kidney disease with stage 1 through stage 4 chronic kidney disease, or unspecified chronic kidney disease: Secondary | ICD-10-CM | POA: Diagnosis not present

## 2018-03-29 ENCOUNTER — Encounter (HOSPITAL_COMMUNITY): Payer: Medicare Other

## 2018-03-29 ENCOUNTER — Telehealth: Payer: Self-pay | Admitting: Family Medicine

## 2018-03-29 NOTE — Telephone Encounter (Signed)
Copied from [address] [id number]. Topic: Quick Communication - See Telephone Encounter >> Mar 29, 2018  4:56 PM Cecelia Byars, NT wrote: CRM for notification. See Telephone encounter for: 03/29/18. Patient called and would like to get a tetanus shot ,please call [phone]

## 2018-03-30 NOTE — Telephone Encounter (Signed)
Patient due for tetanus. No record of previous immunization. Due to medicare patient will need to go to pharmacy.

## 2018-04-12 ENCOUNTER — Ambulatory Visit (HOSPITAL_COMMUNITY)
Admission: RE | Admit: 2018-04-12 | Discharge: 2018-04-12 | Disposition: A | Discharge status: Home / Self Care | Payer: Medicare Other | Source: Ambulatory Visit | Attending: Nephrology | Admitting: Nephrology

## 2018-04-12 VITALS — BP 165/73 | HR 67 | Temp 97.8°F

## 2018-04-12 DIAGNOSIS — N183 Chronic kidney disease, stage 3 unspecified: Secondary | ICD-10-CM

## 2018-04-12 DIAGNOSIS — D631 Anemia in chronic kidney disease: Secondary | ICD-10-CM | POA: Insufficient documentation

## 2018-04-12 LAB — POCT HEMOGLOBIN-HEMACUE: Hemoglobin: 12.7 g/dL (ref 12.0–15.0)

## 2018-04-12 MED ORDER — EPOETIN ALFA 10000 UNIT/ML IJ SOLN: 40000.0000 [IU] | INTRAMUSCULAR | Status: DC

## 2018-04-19 ENCOUNTER — Encounter (HOSPITAL_COMMUNITY): Payer: Medicare Other

## 2018-04-26 ENCOUNTER — Ambulatory Visit (HOSPITAL_COMMUNITY)
Admission: RE | Admit: 2018-04-26 | Discharge: 2018-04-26 | Disposition: A | Discharge status: Home / Self Care | Payer: Medicare Other | Source: Ambulatory Visit | Attending: Nephrology | Admitting: Nephrology

## 2018-04-26 VITALS — BP 166/69 | HR 70 | Temp 97.8°F | Resp 20

## 2018-04-26 DIAGNOSIS — N183 Chronic kidney disease, stage 3 unspecified: Secondary | ICD-10-CM

## 2018-04-26 LAB — RENAL FUNCTION PANEL
Albumin: 3.8 g/dL (ref 3.5–5.0)
Anion gap: 9 (ref 5–15)
BUN: 39 mg/dL — ABNORMAL HIGH (ref 8–23)
CALCIUM: 9.1 mg/dL (ref 8.9–10.3)
CO2: 23 mmol/L (ref 22–32)
Chloride: 105 mmol/L (ref 98–111)
Creatinine, Ser: 1.74 mg/dL — ABNORMAL HIGH (ref 0.44–1.00)
GFR calc Af Amer: 33 mL/min — ABNORMAL LOW (ref >60)
GFR calc non Af Amer: 28 mL/min — ABNORMAL LOW (ref >60)
Glucose, Bld: 112 mg/dL — ABNORMAL HIGH (ref 70–99)
Phosphorus: 4.3 mg/dL (ref 2.5–4.6)
Potassium: 4.6 mmol/L (ref 3.5–5.1)
SODIUM: 137 mmol/L (ref 135–145)

## 2018-04-26 LAB — IRON AND TIBC
Iron: 98 ug/dL (ref 28–170)
Saturation Ratios: 50 % — ABNORMAL HIGH (ref 10.4–31.8)
TIBC: 197 ug/dL — ABNORMAL LOW (ref 250–450)
UIBC: 99 ug/dL

## 2018-04-26 LAB — PROTIME-INR
INR: 1.18
PROTHROMBIN TIME: 14.8 s (ref 11.4–15.2)

## 2018-04-26 LAB — POCT HEMOGLOBIN-HEMACUE: Hemoglobin: 12.7 g/dL (ref 12.0–15.0)

## 2018-04-26 LAB — FERRITIN: Ferritin: 842 ng/mL — ABNORMAL HIGH (ref 11–307)

## 2018-04-26 MED ORDER — EPOETIN ALFA 10000 UNIT/ML IJ SOLN: 40000.0000 [IU] | INTRAMUSCULAR | Status: DC

## 2018-05-03 ENCOUNTER — Encounter (HOSPITAL_COMMUNITY): Payer: Medicare Other

## 2018-05-10 ENCOUNTER — Ambulatory Visit (HOSPITAL_COMMUNITY)
Admission: RE | Admit: 2018-05-10 | Discharge: 2018-05-10 | Disposition: A | Discharge status: Home / Self Care | Payer: Medicare Other | Source: Ambulatory Visit | Attending: Nephrology | Admitting: Nephrology

## 2018-05-10 VITALS — BP 138/82 | HR 69 | Temp 98.6°F | Resp 20

## 2018-05-10 DIAGNOSIS — N183 Chronic kidney disease, stage 3 unspecified: Secondary | ICD-10-CM

## 2018-05-10 LAB — POCT HEMOGLOBIN-HEMACUE: Hemoglobin: 12.1 g/dL (ref 12.0–15.0)

## 2018-05-10 MED ORDER — EPOETIN ALFA 10000 UNIT/ML IJ SOLN: 40000.0000 [IU] | INTRAMUSCULAR | Status: DC

## 2018-05-17 ENCOUNTER — Encounter (HOSPITAL_COMMUNITY): Payer: Medicare Other

## 2018-05-24 ENCOUNTER — Ambulatory Visit (HOSPITAL_COMMUNITY)
Admission: RE | Admit: 2018-05-24 | Discharge: 2018-05-24 | Disposition: A | Discharge status: Home / Self Care | Payer: Medicare Other | Source: Ambulatory Visit | Attending: Nephrology | Admitting: Nephrology

## 2018-05-24 VITALS — BP 165/75 | HR 64 | Temp 98.0°F | Resp 20

## 2018-05-24 DIAGNOSIS — D631 Anemia in chronic kidney disease: Secondary | ICD-10-CM | POA: Insufficient documentation

## 2018-05-24 DIAGNOSIS — N183 Chronic kidney disease, stage 3 unspecified: Secondary | ICD-10-CM

## 2018-05-24 DIAGNOSIS — Z79899 Other long term (current) drug therapy: Secondary | ICD-10-CM | POA: Insufficient documentation

## 2018-05-24 DIAGNOSIS — Z5181 Encounter for therapeutic drug level monitoring: Secondary | ICD-10-CM | POA: Diagnosis not present

## 2018-05-24 LAB — IRON AND TIBC
Iron: 111 ug/dL (ref 28–170)
Saturation Ratios: 59 % — ABNORMAL HIGH (ref 10.4–31.8)
TIBC: 189 ug/dL — ABNORMAL LOW (ref 250–450)
UIBC: 78 ug/dL

## 2018-05-24 LAB — RENAL FUNCTION PANEL
Albumin: 3.7 g/dL (ref 3.5–5.0)
Anion gap: 11 (ref 5–15)
BUN: 44 mg/dL — ABNORMAL HIGH (ref 8–23)
CO2: 24 mmol/L (ref 22–32)
Calcium: 9.2 mg/dL (ref 8.9–10.3)
Chloride: 104 mmol/L (ref 98–111)
Creatinine, Ser: 2.02 mg/dL — ABNORMAL HIGH (ref 0.44–1.00)
GFR calc Af Amer: 27 mL/min — ABNORMAL LOW (ref >60)
GFR, EST NON AFRICAN AMERICAN: 24 mL/min — AB (ref >60)
Glucose, Bld: 92 mg/dL (ref 70–99)
Phosphorus: 4.3 mg/dL (ref 2.5–4.6)
Potassium: 4.7 mmol/L (ref 3.5–5.1)
Sodium: 139 mmol/L (ref 135–145)

## 2018-05-24 LAB — FERRITIN: FERRITIN: 1181 ng/mL — AB (ref 11–307)

## 2018-05-24 LAB — PROTIME-INR
INR: 1.2
Prothrombin Time: 15.1 seconds (ref 11.4–15.2)

## 2018-05-24 MED ORDER — EPOETIN ALFA 40000 UNIT/ML IJ SOLN
INTRAMUSCULAR | Status: AC
  Administered 2018-05-24: 40000 [IU]
  Filled 2018-05-24: qty 1

## 2018-05-24 MED ORDER — EPOETIN ALFA 10000 UNIT/ML IJ SOLN: 40000.0000 [IU] | INTRAMUSCULAR | Status: DC

## 2018-05-25 LAB — POCT HEMOGLOBIN-HEMACUE: Hemoglobin: 11.1 g/dL — ABNORMAL LOW (ref 12.0–15.0)

## 2018-05-31 ENCOUNTER — Ambulatory Visit (HOSPITAL_COMMUNITY)
Admission: RE | Admit: 2018-05-31 | Discharge: 2018-05-31 | Disposition: A | Discharge status: Home / Self Care | Payer: Medicare Other | Source: Ambulatory Visit | Attending: Nephrology | Admitting: Nephrology

## 2018-05-31 VITALS — BP 138/65 | HR 71 | Temp 98.3°F | Resp 20

## 2018-05-31 DIAGNOSIS — N183 Chronic kidney disease, stage 3 unspecified: Secondary | ICD-10-CM

## 2018-05-31 LAB — POCT HEMOGLOBIN-HEMACUE: Hemoglobin: 11.1 g/dL — ABNORMAL LOW (ref 12.0–15.0)

## 2018-05-31 MED ORDER — EPOETIN ALFA 10000 UNIT/ML IJ SOLN: 40000.0000 [IU] | INTRAMUSCULAR | Status: DC

## 2018-05-31 MED ORDER — EPOETIN ALFA 40000 UNIT/ML IJ SOLN
INTRAMUSCULAR | Status: AC
  Administered 2018-05-31: 40000 [IU] via SUBCUTANEOUS
  Filled 2018-05-31: qty 1

## 2018-06-06 ENCOUNTER — Encounter (HOSPITAL_COMMUNITY): Payer: Medicare Other

## 2018-06-07 ENCOUNTER — Encounter (HOSPITAL_COMMUNITY): Payer: Medicare Other

## 2018-06-07 ENCOUNTER — Ambulatory Visit (INDEPENDENT_AMBULATORY_CARE_PROVIDER_SITE_OTHER): Payer: Medicare Other | Admitting: Family Medicine

## 2018-06-07 ENCOUNTER — Encounter: Payer: Self-pay | Admitting: Family Medicine

## 2018-06-07 VITALS — BP 158/80 | HR 68 | Resp 12 | Ht 63.0 in | Wt 139.0 lb

## 2018-06-07 DIAGNOSIS — J324 Chronic pansinusitis: Secondary | ICD-10-CM

## 2018-06-07 DIAGNOSIS — I10 Essential (primary) hypertension: Secondary | ICD-10-CM

## 2018-06-07 LAB — COMPREHENSIVE METABOLIC PANEL
ALT: 11 U/L (ref 0–35)
AST: 12 U/L (ref 0–37)
Albumin: 3.9 g/dL (ref 3.5–5.2)
Alkaline Phosphatase: 133 U/L — ABNORMAL HIGH (ref 39–117)
BILIRUBIN TOTAL: 0.7 mg/dL (ref 0.2–1.2)
BUN: 36 mg/dL — ABNORMAL HIGH (ref 6–23)
CHLORIDE: 102 meq/L (ref 96–112)
CO2: 28 mEq/L (ref 19–32)
CREATININE: 1.64 mg/dL — AB (ref 0.40–1.20)
Calcium: 9.2 mg/dL (ref 8.4–10.5)
GFR: 36.94 mL/min — ABNORMAL LOW (ref >60.00)
Glucose, Bld: 90 mg/dL (ref 70–99)
Potassium: 4.6 mEq/L (ref 3.5–5.1)
Sodium: 138 mEq/L (ref 135–145)
Total Protein: 6.4 g/dL (ref 6.0–8.3)

## 2018-06-07 LAB — LIPID PANEL
Cholesterol: 145 mg/dL (ref 0–200)
HDL: 42.8 mg/dL (ref >39.00)
LDL Cholesterol: 73 mg/dL (ref 0–99)
NonHDL: 102.01
TRIGLYCERIDES: 144 mg/dL (ref 0.0–149.0)
Total CHOL/HDL Ratio: 3
VLDL: 28.8 mg/dL (ref 0.0–40.0)

## 2018-06-07 MED ORDER — CEFDINIR 300 MG PO CAPS: 300.0000 mg | ORAL_CAPSULE | Freq: Two times a day (BID) | ORAL | 0 refills | Status: DC

## 2018-06-07 NOTE — Progress Notes (Signed)
Patient ID: [name], female    DOB: 12/07/1940  Age: 78 y.o. MRN: [id number]    Subjective:  Subjective  HPI [name] presents for f/u htn and cholesterol.  She c/o cough and congestion x 1 month-- she is taking her zyrtec and flonase but it is not helping.  Mucus is clear / yellow  No fevers, no bodyaches   Review of Systems  Constitutional: Negative for chills and fever.  HENT: Positive for congestion, postnasal drip, rhinorrhea and sinus pressure. Negative for sore throat.   Respiratory: Positive for cough. Negative for chest tightness, shortness of breath and wheezing.   Cardiovascular: Negative for chest pain, palpitations and leg swelling.  Allergic/Immunologic: Negative for environmental allergies.    History Past Medical History:  Diagnosis Date  . Abdominal wall hernia   . Anemia    "when lupus flares up"  . Anxiety   . Arthritis    "in my joints"  . Atrial fibrillation ([place])   . CAP (community acquired pneumonia) 08/20/2014  . Crohn's disease ([place])   . DVT of leg (deep venous thrombosis) ([place]) 2012-8./13/2013   "have had one in each leg now"  . DVT of lower extremity, bilateral ([place])    "have had them in both legs; last one was on the RLE this year" (04/25/2012)  . GERD (gastroesophageal reflux disease)   . Gout 2016  . History of hiatal hernia   . History of kidney stones   . Hypertension   . Hypothyroidism   . Lower GI bleed 2012  . Numbness and tingling of foot    bilaterally  . OSA on CPAP   . Refusal of blood transfusion for reasons of conscience 12/20/2011   pt is Jehovah's Witness  . Renal cell carcinoma   . Renal disorder    S/P nephrectomy; dialysis; "working fine now" (12/20/11)  . SBO (small bowel obstruction) ([place]) 11/2016  . SLE (systemic lupus erythematosus) ([place])     She has a past surgical history that includes Cesarean section (1984); Nephrectomy transplanted organ (2006); Colectomy (1998);  Excisional hemorrhoidectomy; Cataract extraction w/ intraocular lens  implant, bilateral (2005-2010); Vena cava filter placement (2012); Insertion of dialysis catheter ([id number]); Cholecystectomy (1995); Colostomy (1998); Colostomy reversal (1999); Bone marrow biopsy (1993; 1995); Abdominal exploration surgery (1995); Parathyroid implant removal (Left); Colonoscopy (N/A, 08/30/2013); Breast excisional biopsy; and Colonoscopy with propofol (N/A, 09/06/2017).   Her family history includes Heart disease in her mother; Hypertension in her unknown relative; Sleep apnea in her sister.She reports that she has never smoked. She has never used smokeless tobacco. She reports that she does not drink alcohol or use drugs.  Current Outpatient Medications on File Prior to Visit  Medication Sig Dispense Refill  . acetaminophen (TYLENOL) 325 MG tablet Take 650 mg by mouth every 6 (six) hours as needed for mild pain.    Marland Kitchen allopurinol (ZYLOPRIM) 100 MG tablet Take 1 tablet (100 mg total) by mouth 2 (two) times daily. 30 tablet 2  . calcitRIOL (ROCALTROL) 0.25 MCG capsule Take 0.25-0.5 mcg by mouth See admin instructions. Take 0.5 mcg by mouth daily on Monday, Wednesday, Friday and Take 0.25 mcg by mouth daily on all other days    . epoetin alfa (EPOGEN,PROCRIT) 20233 UNIT/ML injection Inject 40,000 Units into the skin every 21 ( twenty-one) days.     Marland Kitchen gabapentin (NEURONTIN) 100 MG capsule Take 2 capsules (200  mg total) by mouth at bedtime.    . levothyroxine (SYNTHROID, LEVOTHROID) 100 MCG tablet Take 1 tablet (100 mcg total) by mouth daily.    . MAGNESIUM-OXIDE 400 (241.3 MG) MG tablet Take 400 mg by mouth 2 (two) times daily.  0  . Melatonin 5 MG CAPS Take 5 mg by mouth daily.     . metoprolol tartrate (LOPRESSOR) 50 MG tablet Take 50 mg by mouth 2 (two) times daily.    . mycophenolate (MYFORTIC) 180 MG EC tablet Take 360 mg by mouth 2 (two) times daily.      . omeprazole (PRILOSEC) 20 MG capsule Take 20 mg by mouth  daily.    . polyethylene glycol (MIRALAX / GLYCOLAX) packet Take 17 g by mouth daily as needed for moderate constipation.     . Polyethylene Glycol 400 (BLINK TEARS) 0.25 % GEL Place 1 drop into both eyes 2 (two) times daily as needed (for dry eyes).    . predniSONE (DELTASONE) 5 MG tablet Take 5 mg by mouth daily.     . Probiotic Product (PROBIOTIC PO) Take 2 tablets by mouth daily.    . tacrolimus (PROGRAF) 1 MG capsule Take 3 mg by mouth 2 (two) times daily.     . warfarin (COUMADIN) 2.5 MG tablet Take 0.5-1 tablets (1.25-2.5 mg total) by mouth See admin instructions. Take 2.5 mg by mouth daily on Monday, Wednesday, Friday and Take 1.25 mg by mouth daily on all other days     No current facility-administered medications on file prior to visit.      Objective:  Objective  Physical Exam Vitals signs and nursing note reviewed.    BP (!) 164/86   Pulse 68   Resp 12   Ht 5' 3" (1.6 m)   Wt 139 lb (63 kg)   LMP 03/05/2014   SpO2 98%   BMI 24.62 kg/m  Wt Readings from Last 3 Encounters:  06/07/18 139 lb (63 kg)  02/10/18 137 lb 2 oz (62.2 kg)  12/13/17 130 lb 12 oz (59.3 kg)     Lab Results  Component Value Date   WBC 6.2 02/12/2018   HGB 11.1 (L) 05/31/2018   HCT 30.4 (L) 02/12/2018   PLT 135 (L) 02/12/2018   GLUCOSE 92 05/24/2018   CHOL 157 12/04/2017   TRIG 191.0 (H) 12/04/2017   HDL 44.00 12/04/2017   LDLCALC 74 12/04/2017   ALT 11 12/04/2017   AST 13 12/04/2017   NA 139 05/24/2018   K 4.7 05/24/2018   CL 104 05/24/2018   CREATININE 2.02 (H) 05/24/2018   BUN 44 (H) 05/24/2018   CO2 24 05/24/2018   TSH 0.49 12/04/2017   INR 1.20 05/24/2018   HGBA1C 6.5 (H) 11/12/2014    No results found.   Assessment & Plan:  Plan  I am having Gwendoline D. Brooking maintain her calcitRIOL, polyethylene glycol, mycophenolate, predniSONE, epoetin alfa, tacrolimus, acetaminophen, MAGNESIUM-OXIDE, allopurinol, gabapentin, levothyroxine, omeprazole, metoprolol tartrate, Probiotic  Product (PROBIOTIC PO), Polyethylene Glycol 400, Melatonin, and warfarin.  No orders of the defined types were placed in this encounter.   Problem List Items Addressed This Visit    None      Follow-up: No follow-ups on file.   R Lowne Chase, DO     

## 2018-06-07 NOTE — Patient Instructions (Signed)
Sinusitis, Adult  Sinusitis is inflammation of your sinuses. Sinuses are hollow spaces in the bones around your face. Your sinuses are located:   Around your eyes.   In the middle of your forehead.   Behind your nose.   In your cheekbones.  Mucus normally drains out of your sinuses. When your nasal tissues become inflamed or swollen, mucus can become trapped or blocked. This allows bacteria, viruses, and fungi to grow, which leads to infection. Most infections of the sinuses are caused by a virus.  Sinusitis can develop quickly. It can last for up to 4 weeks (acute) or for more than 12 weeks (chronic). Sinusitis often develops after a cold.  What are the causes?  This condition is caused by anything that creates swelling in the sinuses or stops mucus from draining. This includes:   Allergies.   Asthma.   Infection from bacteria or viruses.   Deformities or blockages in your nose or sinuses.   Abnormal growths in the nose (nasal polyps).   Pollutants, such as chemicals or irritants in the air.   Infection from fungi (rare).  What increases the risk?  You are more likely to develop this condition if you:   Have a weak body defense system (immune system).   Do a lot of swimming or diving.   Overuse nasal sprays.   Smoke.  What are the signs or symptoms?  The main symptoms of this condition are pain and a feeling of pressure around the affected sinuses. Other symptoms include:   Stuffy nose or congestion.   Thick drainage from your nose.   Swelling and warmth over the affected sinuses.   Headache.   Upper toothache.   A cough that may get worse at night.   Extra mucus that collects in the throat or the back of the nose (postnasal drip).   Decreased sense of smell and taste.   Fatigue.   A fever.   Sore throat.   Bad breath.  How is this diagnosed?  This condition is diagnosed based on:   Your symptoms.   Your medical history.   A physical exam.   Tests to find out if your condition is  acute or chronic. This may include:  ? Checking your nose for nasal polyps.  ? Viewing your sinuses using a device that has a light (endoscope).  ? Testing for allergies or bacteria.  ? Imaging tests, such as an MRI or CT scan.  In rare cases, a bone biopsy may be done to rule out more serious types of fungal sinus disease.  How is this treated?  Treatment for sinusitis depends on the cause and whether your condition is chronic or acute.   If caused by a virus, your symptoms should go away on their own within 10 days. You may be given medicines to relieve symptoms. They include:  ? Medicines that shrink swollen nasal passages (topical intranasal decongestants).  ? Medicines that treat allergies (antihistamines).  ? A spray that eases inflammation of the nostrils (topical intranasal corticosteroids).  ? Rinses that help get rid of thick mucus in your nose (nasal saline washes).   If caused by bacteria, your health care provider may recommend waiting to see if your symptoms improve. Most bacterial infections will get better without antibiotic medicine. You may be given antibiotics if you have:  ? A severe infection.  ? A weak immune system.   If caused by narrow nasal passages or nasal polyps, you may need   to have surgery.  Follow these instructions at home:  Medicines   Take, use, or apply over-the-counter and prescription medicines only as told by your health care provider. These may include nasal sprays.   If you were prescribed an antibiotic medicine, take it as told by your health care provider. Do not stop taking the antibiotic even if you start to feel better.  Hydrate and humidify     Drink enough fluid to keep your urine pale yellow. Staying hydrated will help to thin your mucus.   Use a cool mist humidifier to keep the humidity level in your home above 50%.   Inhale steam for 10-15 minutes, 3-4 times a day, or as told by your health care provider. You can do this in the bathroom while a hot shower is  running.   Limit your exposure to cool or dry air.  Rest   Rest as much as possible.   Sleep with your head raised (elevated).   Make sure you get enough sleep each night.  General instructions     Apply a warm, moist washcloth to your face 3-4 times a day or as told by your health care provider. This will help with discomfort.   Wash your hands often with soap and water to reduce your exposure to germs. If soap and water are not available, use hand sanitizer.   Do not smoke. Avoid being around people who are smoking (secondhand smoke).   Keep all follow-up visits as told by your health care provider. This is important.  Contact a health care provider if:   You have a fever.   Your symptoms get worse.   Your symptoms do not improve within 10 days.  Get help right away if:   You have a severe headache.   You have persistent vomiting.   You have severe pain or swelling around your face or eyes.   You have vision problems.   You develop confusion.   Your neck is stiff.   You have trouble breathing.  Summary   Sinusitis is soreness and inflammation of your sinuses. Sinuses are hollow spaces in the bones around your face.   This condition is caused by nasal tissues that become inflamed or swollen. The swelling traps or blocks the flow of mucus. This allows bacteria, viruses, and fungi to grow, which leads to infection.   If you were prescribed an antibiotic medicine, take it as told by your health care provider. Do not stop taking the antibiotic even if you start to feel better.   Keep all follow-up visits as told by your health care provider. This is important.  This information is not intended to replace advice given to you by your health care provider. Make sure you discuss any questions you have with your health care provider.  Document Released: 04/25/2005 Document Revised: 09/25/2017 Document Reviewed: 09/25/2017  Elsevier Interactive Patient Education  2019 [employer]

## 2018-06-13 ENCOUNTER — Ambulatory Visit (HOSPITAL_COMMUNITY)
Admission: RE | Admit: 2018-06-13 | Discharge: 2018-06-13 | Disposition: A | Discharge status: Home / Self Care | Payer: Medicare Other | Source: Ambulatory Visit | Attending: Nephrology | Admitting: Nephrology

## 2018-06-13 VITALS — BP 175/85 | HR 66 | Temp 98.6°F | Resp 20

## 2018-06-13 DIAGNOSIS — N183 Chronic kidney disease, stage 3 unspecified: Secondary | ICD-10-CM

## 2018-06-13 LAB — POCT HEMOGLOBIN-HEMACUE: Hemoglobin: 12.7 g/dL (ref 12.0–15.0)

## 2018-06-13 MED ORDER — EPOETIN ALFA 40000 UNIT/ML IJ SOLN
INTRAMUSCULAR | Status: AC
  Filled 2018-06-13: qty 1

## 2018-06-13 MED ORDER — EPOETIN ALFA 10000 UNIT/ML IJ SOLN: 40000.0000 [IU] | INTRAMUSCULAR | Status: DC

## 2018-06-14 ENCOUNTER — Encounter: Payer: Self-pay | Admitting: *Deleted

## 2018-06-14 ENCOUNTER — Encounter (HOSPITAL_COMMUNITY): Payer: Medicare Other

## 2018-06-21 ENCOUNTER — Encounter (HOSPITAL_COMMUNITY): Payer: Medicare Other

## 2018-06-28 ENCOUNTER — Ambulatory Visit (INDEPENDENT_AMBULATORY_CARE_PROVIDER_SITE_OTHER): Payer: Medicare Other | Admitting: Internal Medicine

## 2018-06-28 ENCOUNTER — Encounter: Payer: Self-pay | Admitting: Internal Medicine

## 2018-06-28 ENCOUNTER — Ambulatory Visit (HOSPITAL_COMMUNITY)
Admission: RE | Admit: 2018-06-28 | Discharge: 2018-06-28 | Disposition: A | Discharge status: Home / Self Care | Payer: Medicare Other | Source: Ambulatory Visit | Attending: Nephrology | Admitting: Nephrology

## 2018-06-28 VITALS — BP 178/89 | HR 64 | Temp 97.8°F | Resp 18

## 2018-06-28 VITALS — BP 134/68 | HR 62 | Ht 63.0 in | Wt 137.2 lb

## 2018-06-28 DIAGNOSIS — Z940 Kidney transplant status: Secondary | ICD-10-CM

## 2018-06-28 DIAGNOSIS — D631 Anemia in chronic kidney disease: Secondary | ICD-10-CM | POA: Insufficient documentation

## 2018-06-28 DIAGNOSIS — G4733 Obstructive sleep apnea (adult) (pediatric): Secondary | ICD-10-CM | POA: Diagnosis not present

## 2018-06-28 DIAGNOSIS — Z5181 Encounter for therapeutic drug level monitoring: Secondary | ICD-10-CM | POA: Insufficient documentation

## 2018-06-28 DIAGNOSIS — N183 Chronic kidney disease, stage 3 unspecified: Secondary | ICD-10-CM

## 2018-06-28 DIAGNOSIS — Z79899 Other long term (current) drug therapy: Secondary | ICD-10-CM | POA: Insufficient documentation

## 2018-06-28 LAB — RENAL FUNCTION PANEL
Albumin: 3.7 g/dL (ref 3.5–5.0)
Anion gap: 8 (ref 5–15)
BUN: 49 mg/dL — ABNORMAL HIGH (ref 8–23)
CALCIUM: 9.4 mg/dL (ref 8.9–10.3)
CO2: 24 mmol/L (ref 22–32)
Chloride: 104 mmol/L (ref 98–111)
Creatinine, Ser: 1.9 mg/dL — ABNORMAL HIGH (ref 0.44–1.00)
GFR calc Af Amer: 29 mL/min — ABNORMAL LOW (ref >60)
GFR calc non Af Amer: 25 mL/min — ABNORMAL LOW (ref >60)
Glucose, Bld: 87 mg/dL (ref 70–99)
Phosphorus: 4.2 mg/dL (ref 2.5–4.6)
Potassium: 4.5 mmol/L (ref 3.5–5.1)
Sodium: 136 mmol/L (ref 135–145)

## 2018-06-28 LAB — IRON AND TIBC
Iron: 141 ug/dL (ref 28–170)
Saturation Ratios: 75 % — ABNORMAL HIGH (ref 10.4–31.8)
TIBC: 189 ug/dL — ABNORMAL LOW (ref 250–450)
UIBC: 48 ug/dL

## 2018-06-28 LAB — PROTIME-INR
INR: 1.27
PROTHROMBIN TIME: 15.8 s — AB (ref 11.4–15.2)

## 2018-06-28 LAB — FERRITIN: Ferritin: 1007 ng/mL — ABNORMAL HIGH (ref 11–307)

## 2018-06-28 MED ORDER — EPOETIN ALFA 10000 UNIT/ML IJ SOLN: 40000.0000 [IU] | INTRAMUSCULAR | Status: DC

## 2018-06-28 MED ORDER — EPOETIN ALFA 40000 UNIT/ML IJ SOLN
INTRAMUSCULAR | Status: AC
  Administered 2018-06-28: 40000 [IU]
  Filled 2018-06-28: qty 1

## 2018-06-28 NOTE — Progress Notes (Signed)
HPI Female never smoker followed for OSA NPSG 10/15/13 58.5/ hr. Weight 130 lbs  ----------------------------------------------------------------------------------  06/22/17-  77 year old female, Jehovah's Witness, never smoker followed for OSA , complicated by allergic rhinitis, anemia, aortic murmur, A. Fib, CKD3/ transplant, Crohn's disease, GERD, HBP, hypothyroid, lupus CPAP auto 5-15/[facility]>> 5-20 today for snoring ----OSA: [facility]ME [facility]. Pt wears CPAP nightly and DL attached. No new supplies needed. Pt fell yesterday on Right Arm-has not been checked out.  Cannot raise right arm and shoulder. Told she still snores at times wearing her CPAP.  Feels rested and likes new machine. Download 100% compliance, AHI 1.6/hour  06/28/2018- 78 year old female, Jehovah's Witness, never smoker followed for OSA , complicated by allergic rhinitis, anemia, aortic murmur, A. Fib, CKD3/ Renal transplant, Crohn's disease, GERD, HBP, hypothyroid, lupus CPAP auto 5-20/ [facility] -----[facility]A: [facility] Pt wears CPAP nightly and DL attached; pt needs new supplies.  Body weight today 137 pounds Download 100% compliance AHI 1.3/hour She reports doing really well, comfortable with nasal mask.  Sleeping well. Denies other significant medical issues.  ROS-see HPI  + = positive Constitutional:   No-   weight loss, night sweats, fevers, chills, fatigue, lassitude. HEENT:   No-  headaches, difficulty swallowing, tooth/dental problems, sore throat,       No-  sneezing, itching, ear ache, nasal congestion, post nasal drip,  CV:  No-   chest pain, orthopnea, PND, swelling in lower extremities, anasarca,                                                           dizziness, palpitations Resp: + shortness of breath with exertion or at rest.              No-   productive cough,  No non-productive cough,  No- coughing up of blood.              No-   change in color of mucus.  No- wheezing.   Skin: No-   rash or lesions. GI:  No-    heartburn, indigestion, abdominal pain, nausea, vomiting,  GU:  MS:  No-   joint pain or swelling.   Neuro-     nothing unusual Psych:  No- change in mood or affect. No depression or anxiety.  No memory loss.  OBJ- Physical Exam General- Alert, Oriented, Affect-appropriate, Distress- none acute Skin- rash-none, lesions- none, excoriation- none Lymphadenopathy- none Head- atraumatic            Eyes- Gross vision intact, PERRLA, conjunctivae and secretions clear            Ears- Hearing, canals-normal            Nose- +mucus bridging, no-Septal dev, mucus, polyps, erosion, perforation             Throat- Mallampati II-III , mucosa -Not dry , drainage- none, tonsils- atrophic Neck- flexible , trachea midline, no stridor , thyroid nl, carotid no bruit Chest - symmetrical excursion , unlabored           Heart/CV- RRR , no murmur heard , no gallop  , no rub, nl s1 s2                           - JVD- none ,  edema- none, stasis changes- none, varices- none           Lung- clear, wheeze- none, cough- none , dullness-none, rub- none           Chest wall-  Abd- Br/ Gen/ Rectal- Not done, not indicated Extrem- cyanosis- none, clubbing, none, atrophy- none, strength- nl. + Right shoulder painful-cannot raise at shoulder Neuro- grossly intact to observation

## 2018-06-28 NOTE — Patient Instructions (Signed)
Order- [facility]ME [facility]- please replace mask of choice and supplies. Continue auto 5-20, humidifier, AirView/ card  Please call if we can help

## 2018-06-29 LAB — POCT HEMOGLOBIN-HEMACUE: Hemoglobin: 11.4 g/dL — ABNORMAL LOW (ref 12.0–15.0)

## 2018-07-01 NOTE — Assessment & Plan Note (Signed)
She denies impact on her sleep now and says renal function followed by nephrology.

## 2018-07-05 ENCOUNTER — Ambulatory Visit (HOSPITAL_COMMUNITY)
Admission: RE | Admit: 2018-07-05 | Discharge: 2018-07-05 | Disposition: A | Discharge status: Home / Self Care | Payer: Medicare Other | Source: Ambulatory Visit | Attending: Nephrology | Admitting: Nephrology

## 2018-07-05 ENCOUNTER — Encounter (HOSPITAL_COMMUNITY): Payer: Medicare Other

## 2018-07-05 VITALS — BP 137/69 | HR 74 | Temp 97.8°F | Resp 20

## 2018-07-05 DIAGNOSIS — N183 Chronic kidney disease, stage 3 unspecified: Secondary | ICD-10-CM

## 2018-07-05 LAB — POCT HEMOGLOBIN-HEMACUE: Hemoglobin: 11.9 g/dL — ABNORMAL LOW (ref 12.0–15.0)

## 2018-07-05 MED ORDER — EPOETIN ALFA 10000 UNIT/ML IJ SOLN: 40000.0000 [IU] | INTRAMUSCULAR | Status: DC

## 2018-07-05 MED ORDER — EPOETIN ALFA 40000 UNIT/ML IJ SOLN
INTRAMUSCULAR | Status: AC
  Administered 2018-07-05: 40000 [IU] via SUBCUTANEOUS
  Filled 2018-07-05: qty 1

## 2018-07-12 ENCOUNTER — Encounter (HOSPITAL_COMMUNITY): Payer: Medicare Other

## 2018-07-16 DIAGNOSIS — N184 Chronic kidney disease, stage 4 (severe): Secondary | ICD-10-CM | POA: Diagnosis not present

## 2018-07-16 DIAGNOSIS — Z940 Kidney transplant status: Secondary | ICD-10-CM | POA: Diagnosis not present

## 2018-07-16 DIAGNOSIS — Z8719 Personal history of other diseases of the digestive system: Secondary | ICD-10-CM | POA: Diagnosis not present

## 2018-07-16 DIAGNOSIS — E782 Mixed hyperlipidemia: Secondary | ICD-10-CM | POA: Diagnosis not present

## 2018-07-16 DIAGNOSIS — M329 Systemic lupus erythematosus, unspecified: Secondary | ICD-10-CM | POA: Diagnosis not present

## 2018-07-16 DIAGNOSIS — I129 Hypertensive chronic kidney disease with stage 1 through stage 4 chronic kidney disease, or unspecified chronic kidney disease: Secondary | ICD-10-CM | POA: Diagnosis not present

## 2018-07-16 DIAGNOSIS — Z7901 Long term (current) use of anticoagulants: Secondary | ICD-10-CM | POA: Diagnosis not present

## 2018-07-16 DIAGNOSIS — N2581 Secondary hyperparathyroidism of renal origin: Secondary | ICD-10-CM | POA: Diagnosis not present

## 2018-07-16 DIAGNOSIS — D631 Anemia in chronic kidney disease: Secondary | ICD-10-CM | POA: Diagnosis not present

## 2018-07-16 DIAGNOSIS — M109 Gout, unspecified: Secondary | ICD-10-CM | POA: Diagnosis not present

## 2018-07-16 DIAGNOSIS — K219 Gastro-esophageal reflux disease without esophagitis: Secondary | ICD-10-CM | POA: Diagnosis not present

## 2018-07-16 DIAGNOSIS — N2589 Other disorders resulting from impaired renal tubular function: Secondary | ICD-10-CM | POA: Diagnosis not present

## 2018-07-26 ENCOUNTER — Encounter (HOSPITAL_COMMUNITY): Payer: Medicare Other

## 2018-07-26 ENCOUNTER — Other Ambulatory Visit: Payer: Self-pay

## 2018-07-26 ENCOUNTER — Ambulatory Visit (HOSPITAL_COMMUNITY)
Admission: RE | Admit: 2018-07-26 | Discharge: 2018-07-26 | Disposition: A | Discharge status: Home / Self Care | Payer: Medicare Other | Source: Ambulatory Visit | Attending: Nephrology | Admitting: Nephrology

## 2018-07-26 VITALS — BP 173/72 | HR 71 | Temp 98.7°F | Resp 20

## 2018-07-26 DIAGNOSIS — Z5181 Encounter for therapeutic drug level monitoring: Secondary | ICD-10-CM | POA: Insufficient documentation

## 2018-07-26 DIAGNOSIS — N183 Chronic kidney disease, stage 3 unspecified: Secondary | ICD-10-CM

## 2018-07-26 DIAGNOSIS — D631 Anemia in chronic kidney disease: Secondary | ICD-10-CM | POA: Insufficient documentation

## 2018-07-26 DIAGNOSIS — Z79899 Other long term (current) drug therapy: Secondary | ICD-10-CM | POA: Diagnosis not present

## 2018-07-26 LAB — PROTIME-INR
INR: 1.5 — ABNORMAL HIGH (ref 0.8–1.2)
Prothrombin Time: 17.6 seconds — ABNORMAL HIGH (ref 11.4–15.2)

## 2018-07-26 LAB — RENAL FUNCTION PANEL
Albumin: 3.9 g/dL (ref 3.5–5.0)
Anion gap: 9 (ref 5–15)
BUN: 54 mg/dL — ABNORMAL HIGH (ref 8–23)
CO2: 22 mmol/L (ref 22–32)
Calcium: 9.2 mg/dL (ref 8.9–10.3)
Chloride: 104 mmol/L (ref 98–111)
Creatinine, Ser: 2.04 mg/dL — ABNORMAL HIGH (ref 0.44–1.00)
GFR calc Af Amer: 27 mL/min — ABNORMAL LOW (ref >60)
GFR calc non Af Amer: 23 mL/min — ABNORMAL LOW (ref >60)
Glucose, Bld: 88 mg/dL (ref 70–99)
Phosphorus: 4.2 mg/dL (ref 2.5–4.6)
Potassium: 4.7 mmol/L (ref 3.5–5.1)
SODIUM: 135 mmol/L (ref 135–145)

## 2018-07-26 LAB — IRON AND TIBC
Iron: 172 ug/dL — ABNORMAL HIGH (ref 28–170)
Saturation Ratios: 90 % — ABNORMAL HIGH (ref 10.4–31.8)
TIBC: 192 ug/dL — ABNORMAL LOW (ref 250–450)
UIBC: 20 ug/dL

## 2018-07-26 LAB — FERRITIN: Ferritin: 1089 ng/mL — ABNORMAL HIGH (ref 11–307)

## 2018-07-26 MED ORDER — EPOETIN ALFA 40000 UNIT/ML IJ SOLN
INTRAMUSCULAR | Status: AC
  Administered 2018-07-26: 40000 [IU]
  Filled 2018-07-26: qty 1

## 2018-07-26 MED ORDER — EPOETIN ALFA 10000 UNIT/ML IJ SOLN: 40000.0000 [IU] | INTRAMUSCULAR | Status: DC

## 2018-07-27 LAB — POCT HEMOGLOBIN-HEMACUE: HEMOGLOBIN: 11.9 g/dL — AB (ref 12.0–15.0)

## 2018-08-02 ENCOUNTER — Encounter (HOSPITAL_COMMUNITY): Payer: Medicare Other

## 2018-08-22 ENCOUNTER — Other Ambulatory Visit: Payer: Self-pay

## 2018-08-23 ENCOUNTER — Ambulatory Visit (HOSPITAL_COMMUNITY)
Admission: RE | Admit: 2018-08-23 | Discharge: 2018-08-23 | Disposition: A | Discharge status: Home / Self Care | Payer: Medicare Other | Source: Ambulatory Visit | Attending: Nephrology | Admitting: Nephrology

## 2018-08-23 VITALS — BP 145/71 | HR 66 | Temp 97.7°F | Resp 20

## 2018-08-23 DIAGNOSIS — N183 Chronic kidney disease, stage 3 unspecified: Secondary | ICD-10-CM

## 2018-08-23 DIAGNOSIS — D631 Anemia in chronic kidney disease: Secondary | ICD-10-CM | POA: Diagnosis not present

## 2018-08-23 LAB — RENAL FUNCTION PANEL
Albumin: 4 g/dL (ref 3.5–5.0)
Anion gap: 11 (ref 5–15)
BUN: 53 mg/dL — ABNORMAL HIGH (ref 8–23)
CO2: 21 mmol/L — ABNORMAL LOW (ref 22–32)
Calcium: 9.5 mg/dL (ref 8.9–10.3)
Chloride: 103 mmol/L (ref 98–111)
Creatinine, Ser: 2.07 mg/dL — ABNORMAL HIGH (ref 0.44–1.00)
GFR calc Af Amer: 26 mL/min — ABNORMAL LOW (ref >60)
GFR calc non Af Amer: 23 mL/min — ABNORMAL LOW (ref >60)
Glucose, Bld: 95 mg/dL (ref 70–99)
Phosphorus: 4.6 mg/dL (ref 2.5–4.6)
Potassium: 4.9 mmol/L (ref 3.5–5.1)
Sodium: 135 mmol/L (ref 135–145)

## 2018-08-23 LAB — PROTIME-INR
INR: 1.4 — ABNORMAL HIGH (ref 0.8–1.2)
Prothrombin Time: 17.4 seconds — ABNORMAL HIGH (ref 11.4–15.2)

## 2018-08-23 LAB — IRON AND TIBC
Iron: 140 ug/dL (ref 28–170)
Saturation Ratios: 69 % — ABNORMAL HIGH (ref 10.4–31.8)
TIBC: 202 ug/dL — ABNORMAL LOW (ref 250–450)
UIBC: 62 ug/dL

## 2018-08-23 LAB — FERRITIN: Ferritin: 869 ng/mL — ABNORMAL HIGH (ref 11–307)

## 2018-08-23 LAB — POCT HEMOGLOBIN-HEMACUE: Hemoglobin: 11.4 g/dL — ABNORMAL LOW (ref 12.0–15.0)

## 2018-08-23 MED ORDER — EPOETIN ALFA 40000 UNIT/ML IJ SOLN
INTRAMUSCULAR | Status: AC
  Administered 2018-08-23: 14:00:00 40000 [IU]
  Filled 2018-08-23: qty 1

## 2018-08-23 MED ORDER — EPOETIN ALFA 10000 UNIT/ML IJ SOLN: 40000.0000 [IU] | INTRAMUSCULAR | Status: DC

## 2018-08-30 ENCOUNTER — Encounter (HOSPITAL_COMMUNITY): Payer: Medicare Other

## 2018-09-10 DIAGNOSIS — Z7901 Long term (current) use of anticoagulants: Secondary | ICD-10-CM | POA: Diagnosis not present

## 2018-09-19 ENCOUNTER — Other Ambulatory Visit (HOSPITAL_COMMUNITY): Payer: Self-pay | Admitting: *Deleted

## 2018-09-20 ENCOUNTER — Other Ambulatory Visit: Payer: Self-pay

## 2018-09-20 ENCOUNTER — Ambulatory Visit (HOSPITAL_COMMUNITY)
Admission: RE | Admit: 2018-09-20 | Discharge: 2018-09-20 | Disposition: A | Discharge status: Home / Self Care | Payer: Medicare Other | Source: Ambulatory Visit | Attending: Nephrology | Admitting: Nephrology

## 2018-09-20 VITALS — BP 146/70 | HR 65 | Temp 97.3°F | Resp 20

## 2018-09-20 DIAGNOSIS — N184 Chronic kidney disease, stage 4 (severe): Secondary | ICD-10-CM | POA: Diagnosis not present

## 2018-09-20 DIAGNOSIS — N183 Chronic kidney disease, stage 3 unspecified: Secondary | ICD-10-CM

## 2018-09-20 DIAGNOSIS — D631 Anemia in chronic kidney disease: Secondary | ICD-10-CM | POA: Diagnosis not present

## 2018-09-20 LAB — IRON AND TIBC
Iron: 120 ug/dL (ref 28–170)
Saturation Ratios: 61 % — ABNORMAL HIGH (ref 10.4–31.8)
TIBC: 197 ug/dL — ABNORMAL LOW (ref 250–450)
UIBC: 77 ug/dL

## 2018-09-20 LAB — RENAL FUNCTION PANEL
Albumin: 4 g/dL (ref 3.5–5.0)
Anion gap: 10 (ref 5–15)
BUN: 39 mg/dL — ABNORMAL HIGH (ref 8–23)
CO2: 24 mmol/L (ref 22–32)
Calcium: 9.7 mg/dL (ref 8.9–10.3)
Chloride: 103 mmol/L (ref 98–111)
Creatinine, Ser: 1.96 mg/dL — ABNORMAL HIGH (ref 0.44–1.00)
GFR calc Af Amer: 28 mL/min — ABNORMAL LOW (ref >60)
GFR calc non Af Amer: 24 mL/min — ABNORMAL LOW (ref >60)
Glucose, Bld: 98 mg/dL (ref 70–99)
Phosphorus: 4.1 mg/dL (ref 2.5–4.6)
Potassium: 4.4 mmol/L (ref 3.5–5.1)
Sodium: 137 mmol/L (ref 135–145)

## 2018-09-20 LAB — PROTIME-INR
INR: 3.2 — ABNORMAL HIGH (ref 0.8–1.2)
Prothrombin Time: 32.4 seconds — ABNORMAL HIGH (ref 11.4–15.2)

## 2018-09-20 LAB — FERRITIN: Ferritin: 996 ng/mL — ABNORMAL HIGH (ref 11–307)

## 2018-09-20 LAB — POCT HEMOGLOBIN-HEMACUE: Hemoglobin: 12.4 g/dL (ref 12.0–15.0)

## 2018-09-20 MED ORDER — EPOETIN ALFA 40000 UNIT/ML IJ SOLN: 40000.0000 [IU] | INTRAMUSCULAR | Status: DC

## 2018-10-03 ENCOUNTER — Other Ambulatory Visit: Payer: Self-pay

## 2018-10-04 ENCOUNTER — Encounter (HOSPITAL_COMMUNITY)
Admission: RE | Admit: 2018-10-04 | Discharge: 2018-10-04 | Disposition: A | Discharge status: Home / Self Care | Payer: Medicare Other | Source: Ambulatory Visit | Attending: Nephrology | Admitting: Nephrology

## 2018-10-04 ENCOUNTER — Encounter (HOSPITAL_COMMUNITY): Payer: Self-pay

## 2018-10-04 VITALS — BP 128/68 | HR 75 | Temp 97.6°F | Resp 20

## 2018-10-04 DIAGNOSIS — N183 Chronic kidney disease, stage 3 unspecified: Secondary | ICD-10-CM

## 2018-10-04 LAB — POCT HEMOGLOBIN-HEMACUE: Hemoglobin: 11.2 g/dL — ABNORMAL LOW (ref 12.0–15.0)

## 2018-10-04 MED ORDER — EPOETIN ALFA 40000 UNIT/ML IJ SOLN
INTRAMUSCULAR | Status: AC
  Administered 2018-10-04: 40000 [IU] via SUBCUTANEOUS
  Filled 2018-10-04: qty 1

## 2018-10-04 MED ORDER — EPOETIN ALFA 40000 UNIT/ML IJ SOLN: 40000.0000 [IU] | INTRAMUSCULAR | Status: DC

## 2018-10-11 ENCOUNTER — Encounter (HOSPITAL_COMMUNITY): Payer: Medicare Other

## 2018-10-15 DIAGNOSIS — M109 Gout, unspecified: Secondary | ICD-10-CM | POA: Diagnosis not present

## 2018-10-15 DIAGNOSIS — K219 Gastro-esophageal reflux disease without esophagitis: Secondary | ICD-10-CM | POA: Diagnosis not present

## 2018-10-15 DIAGNOSIS — I129 Hypertensive chronic kidney disease with stage 1 through stage 4 chronic kidney disease, or unspecified chronic kidney disease: Secondary | ICD-10-CM | POA: Diagnosis not present

## 2018-10-15 DIAGNOSIS — E782 Mixed hyperlipidemia: Secondary | ICD-10-CM | POA: Diagnosis not present

## 2018-10-15 DIAGNOSIS — N2589 Other disorders resulting from impaired renal tubular function: Secondary | ICD-10-CM | POA: Diagnosis not present

## 2018-10-15 DIAGNOSIS — Z7901 Long term (current) use of anticoagulants: Secondary | ICD-10-CM | POA: Diagnosis not present

## 2018-10-15 DIAGNOSIS — Z8719 Personal history of other diseases of the digestive system: Secondary | ICD-10-CM | POA: Diagnosis not present

## 2018-10-15 DIAGNOSIS — D631 Anemia in chronic kidney disease: Secondary | ICD-10-CM | POA: Diagnosis not present

## 2018-10-15 DIAGNOSIS — N2581 Secondary hyperparathyroidism of renal origin: Secondary | ICD-10-CM | POA: Diagnosis not present

## 2018-10-15 DIAGNOSIS — Z940 Kidney transplant status: Secondary | ICD-10-CM | POA: Diagnosis not present

## 2018-10-15 DIAGNOSIS — N184 Chronic kidney disease, stage 4 (severe): Secondary | ICD-10-CM | POA: Diagnosis not present

## 2018-10-15 DIAGNOSIS — M329 Systemic lupus erythematosus, unspecified: Secondary | ICD-10-CM | POA: Diagnosis not present

## 2018-10-15 DIAGNOSIS — E039 Hypothyroidism, unspecified: Secondary | ICD-10-CM | POA: Diagnosis not present

## 2018-10-25 ENCOUNTER — Encounter (HOSPITAL_COMMUNITY): Payer: Medicare Other

## 2018-11-01 DIAGNOSIS — Z7901 Long term (current) use of anticoagulants: Secondary | ICD-10-CM | POA: Diagnosis not present

## 2018-11-15 ENCOUNTER — Ambulatory Visit (HOSPITAL_COMMUNITY)
Admission: RE | Admit: 2018-11-15 | Discharge: 2018-11-15 | Disposition: A | Discharge status: Home / Self Care | Payer: Medicare Other | Source: Ambulatory Visit | Attending: Nephrology | Admitting: Nephrology

## 2018-11-15 ENCOUNTER — Other Ambulatory Visit: Payer: Self-pay

## 2018-11-15 VITALS — BP 131/66 | HR 70 | Temp 98.0°F | Resp 20

## 2018-11-15 DIAGNOSIS — D631 Anemia in chronic kidney disease: Secondary | ICD-10-CM | POA: Diagnosis not present

## 2018-11-15 DIAGNOSIS — N184 Chronic kidney disease, stage 4 (severe): Secondary | ICD-10-CM | POA: Diagnosis not present

## 2018-11-15 DIAGNOSIS — N183 Chronic kidney disease, stage 3 unspecified: Secondary | ICD-10-CM

## 2018-11-15 LAB — RENAL FUNCTION PANEL
Albumin: 4 g/dL (ref 3.5–5.0)
Anion gap: 13 (ref 5–15)
BUN: 70 mg/dL — ABNORMAL HIGH (ref 8–23)
CO2: 22 mmol/L (ref 22–32)
Calcium: 9.6 mg/dL (ref 8.9–10.3)
Chloride: 101 mmol/L (ref 98–111)
Creatinine, Ser: 2.44 mg/dL — ABNORMAL HIGH (ref 0.44–1.00)
GFR calc Af Amer: 22 mL/min — ABNORMAL LOW (ref >60)
GFR calc non Af Amer: 19 mL/min — ABNORMAL LOW (ref >60)
Glucose, Bld: 99 mg/dL (ref 70–99)
Phosphorus: 5 mg/dL — ABNORMAL HIGH (ref 2.5–4.6)
Potassium: 4.3 mmol/L (ref 3.5–5.1)
Sodium: 136 mmol/L (ref 135–145)

## 2018-11-15 LAB — IRON AND TIBC
Iron: 148 ug/dL (ref 28–170)
Saturation Ratios: 67 % — ABNORMAL HIGH (ref 10.4–31.8)
TIBC: 220 ug/dL — ABNORMAL LOW (ref 250–450)
UIBC: 72 ug/dL

## 2018-11-15 LAB — FERRITIN: Ferritin: 1299 ng/mL — ABNORMAL HIGH (ref 11–307)

## 2018-11-15 LAB — PROTIME-INR
INR: 1 (ref 0.8–1.2)
Prothrombin Time: 13.5 seconds (ref 11.4–15.2)

## 2018-11-15 LAB — POCT HEMOGLOBIN-HEMACUE: Hemoglobin: 10 g/dL — ABNORMAL LOW (ref 12.0–15.0)

## 2018-11-15 MED ORDER — EPOETIN ALFA 10000 UNIT/ML IJ SOLN
40000.0000 [IU] | INTRAMUSCULAR | Status: DC
  Administered 2018-11-15: 40000 [IU] via SUBCUTANEOUS

## 2018-11-15 MED ORDER — EPOETIN ALFA 40000 UNIT/ML IJ SOLN
INTRAMUSCULAR | Status: AC
  Filled 2018-11-15: qty 1

## 2018-11-16 MED FILL — Epoetin Alfa Inj 40000 Unit/ML: INTRAMUSCULAR | Qty: 1 | Status: AC

## 2018-11-27 DIAGNOSIS — Z940 Kidney transplant status: Secondary | ICD-10-CM | POA: Diagnosis not present

## 2018-11-27 DIAGNOSIS — I129 Hypertensive chronic kidney disease with stage 1 through stage 4 chronic kidney disease, or unspecified chronic kidney disease: Secondary | ICD-10-CM | POA: Diagnosis not present

## 2018-12-06 ENCOUNTER — Other Ambulatory Visit: Payer: Self-pay

## 2018-12-06 ENCOUNTER — Ambulatory Visit (HOSPITAL_COMMUNITY)
Admission: RE | Admit: 2018-12-06 | Discharge: 2018-12-06 | Disposition: A | Discharge status: Home / Self Care | Payer: Medicare Other | Source: Ambulatory Visit | Attending: Nephrology | Admitting: Nephrology

## 2018-12-06 ENCOUNTER — Ambulatory Visit: Payer: Medicare Other | Admitting: Family Medicine

## 2018-12-06 VITALS — BP 143/71 | HR 67 | Temp 97.3°F | Resp 20

## 2018-12-06 DIAGNOSIS — N183 Chronic kidney disease, stage 3 unspecified: Secondary | ICD-10-CM

## 2018-12-06 DIAGNOSIS — Z7901 Long term (current) use of anticoagulants: Secondary | ICD-10-CM | POA: Insufficient documentation

## 2018-12-06 LAB — PROTIME-INR
INR: 1.3 — ABNORMAL HIGH (ref 0.8–1.2)
Prothrombin Time: 15.7 seconds — ABNORMAL HIGH (ref 11.4–15.2)

## 2018-12-06 LAB — POCT HEMOGLOBIN-HEMACUE: Hemoglobin: 11.8 g/dL — ABNORMAL LOW (ref 12.0–15.0)

## 2018-12-06 MED ORDER — EPOETIN ALFA 40000 UNIT/ML IJ SOLN
INTRAMUSCULAR | Status: AC
  Administered 2018-12-06: 40000 [IU]
  Filled 2018-12-06: qty 1

## 2018-12-06 MED ORDER — EPOETIN ALFA 10000 UNIT/ML IJ SOLN: 40000.0000 [IU] | INTRAMUSCULAR | Status: DC

## 2018-12-25 DIAGNOSIS — K624 Stenosis of anus and rectum: Secondary | ICD-10-CM | POA: Diagnosis not present

## 2018-12-25 DIAGNOSIS — K861 Other chronic pancreatitis: Secondary | ICD-10-CM | POA: Diagnosis not present

## 2018-12-25 DIAGNOSIS — Z8719 Personal history of other diseases of the digestive system: Secondary | ICD-10-CM | POA: Diagnosis not present

## 2018-12-25 DIAGNOSIS — Z95828 Presence of other vascular implants and grafts: Secondary | ICD-10-CM | POA: Diagnosis not present

## 2018-12-25 DIAGNOSIS — Z7901 Long term (current) use of anticoagulants: Secondary | ICD-10-CM | POA: Diagnosis not present

## 2018-12-25 DIAGNOSIS — Z940 Kidney transplant status: Secondary | ICD-10-CM | POA: Diagnosis not present

## 2018-12-25 DIAGNOSIS — K625 Hemorrhage of anus and rectum: Secondary | ICD-10-CM | POA: Diagnosis not present

## 2018-12-25 DIAGNOSIS — I480 Paroxysmal atrial fibrillation: Secondary | ICD-10-CM | POA: Diagnosis not present

## 2018-12-25 DIAGNOSIS — Z789 Other specified health status: Secondary | ICD-10-CM | POA: Diagnosis not present

## 2018-12-25 DIAGNOSIS — Z85528 Personal history of other malignant neoplasm of kidney: Secondary | ICD-10-CM | POA: Diagnosis not present

## 2018-12-27 ENCOUNTER — Ambulatory Visit (HOSPITAL_COMMUNITY)
Admission: RE | Admit: 2018-12-27 | Discharge: 2018-12-27 | Disposition: A | Discharge status: Home / Self Care | Payer: Medicare Other | Source: Ambulatory Visit | Attending: Nephrology | Admitting: Nephrology

## 2018-12-27 ENCOUNTER — Other Ambulatory Visit: Payer: Self-pay

## 2018-12-27 ENCOUNTER — Ambulatory Visit (HOSPITAL_BASED_OUTPATIENT_CLINIC_OR_DEPARTMENT_OTHER): Payer: Medicare Other

## 2018-12-27 VITALS — BP 139/62 | HR 66 | Temp 97.2°F | Resp 20

## 2018-12-27 DIAGNOSIS — D631 Anemia in chronic kidney disease: Secondary | ICD-10-CM | POA: Insufficient documentation

## 2018-12-27 DIAGNOSIS — N183 Chronic kidney disease, stage 3 unspecified: Secondary | ICD-10-CM

## 2018-12-27 DIAGNOSIS — I351 Nonrheumatic aortic (valve) insufficiency: Secondary | ICD-10-CM | POA: Diagnosis not present

## 2018-12-27 DIAGNOSIS — I352 Nonrheumatic aortic (valve) stenosis with insufficiency: Secondary | ICD-10-CM | POA: Insufficient documentation

## 2018-12-27 LAB — RENAL FUNCTION PANEL
Albumin: 4 g/dL (ref 3.5–5.0)
Anion gap: 12 (ref 5–15)
BUN: 57 mg/dL — ABNORMAL HIGH (ref 8–23)
CO2: 22 mmol/L (ref 22–32)
Calcium: 9.5 mg/dL (ref 8.9–10.3)
Chloride: 102 mmol/L (ref 98–111)
Creatinine, Ser: 2.18 mg/dL — ABNORMAL HIGH (ref 0.44–1.00)
GFR calc Af Amer: 25 mL/min — ABNORMAL LOW (ref >60)
GFR calc non Af Amer: 21 mL/min — ABNORMAL LOW (ref >60)
Glucose, Bld: 94 mg/dL (ref 70–99)
Phosphorus: 4.4 mg/dL (ref 2.5–4.6)
Potassium: 4.4 mmol/L (ref 3.5–5.1)
Sodium: 136 mmol/L (ref 135–145)

## 2018-12-27 LAB — IRON AND TIBC
Iron: 149 ug/dL (ref 28–170)
Saturation Ratios: 69 % — ABNORMAL HIGH (ref 10.4–31.8)
TIBC: 217 ug/dL — ABNORMAL LOW (ref 250–450)
UIBC: 68 ug/dL

## 2018-12-27 LAB — FERRITIN: Ferritin: 1036 ng/mL — ABNORMAL HIGH (ref 11–307)

## 2018-12-27 LAB — PROTIME-INR
INR: 1.5 — ABNORMAL HIGH (ref 0.8–1.2)
Prothrombin Time: 18.2 seconds — ABNORMAL HIGH (ref 11.4–15.2)

## 2018-12-27 LAB — POCT HEMOGLOBIN-HEMACUE: Hemoglobin: 10.6 g/dL — ABNORMAL LOW (ref 12.0–15.0)

## 2018-12-27 MED ORDER — EPOETIN ALFA 40000 UNIT/ML IJ SOLN
INTRAMUSCULAR | Status: AC
  Administered 2018-12-27: 40000 [IU]
  Filled 2018-12-27: qty 1

## 2018-12-27 MED ORDER — EPOETIN ALFA 10000 UNIT/ML IJ SOLN: 40000.0000 [IU] | INTRAMUSCULAR | Status: DC

## 2018-12-28 ENCOUNTER — Telehealth: Payer: Self-pay

## 2018-12-28 NOTE — Telephone Encounter (Signed)
The patient has been notified of the result and verbalized understanding.  All questions (if any) were answered. Wilma Flavin, RN 12/28/2018 4:10 PM

## 2019-01-09 DIAGNOSIS — Z23 Encounter for immunization: Secondary | ICD-10-CM | POA: Diagnosis not present

## 2019-01-09 DIAGNOSIS — N2589 Other disorders resulting from impaired renal tubular function: Secondary | ICD-10-CM | POA: Diagnosis not present

## 2019-01-09 DIAGNOSIS — M109 Gout, unspecified: Secondary | ICD-10-CM | POA: Diagnosis not present

## 2019-01-09 DIAGNOSIS — N2581 Secondary hyperparathyroidism of renal origin: Secondary | ICD-10-CM | POA: Diagnosis not present

## 2019-01-09 DIAGNOSIS — N184 Chronic kidney disease, stage 4 (severe): Secondary | ICD-10-CM | POA: Diagnosis not present

## 2019-01-09 DIAGNOSIS — I4891 Unspecified atrial fibrillation: Secondary | ICD-10-CM | POA: Diagnosis not present

## 2019-01-09 DIAGNOSIS — D631 Anemia in chronic kidney disease: Secondary | ICD-10-CM | POA: Diagnosis not present

## 2019-01-09 DIAGNOSIS — Z7901 Long term (current) use of anticoagulants: Secondary | ICD-10-CM | POA: Diagnosis not present

## 2019-01-09 DIAGNOSIS — Z940 Kidney transplant status: Secondary | ICD-10-CM | POA: Diagnosis not present

## 2019-01-09 DIAGNOSIS — I129 Hypertensive chronic kidney disease with stage 1 through stage 4 chronic kidney disease, or unspecified chronic kidney disease: Secondary | ICD-10-CM | POA: Diagnosis not present

## 2019-01-09 DIAGNOSIS — Z86718 Personal history of other venous thrombosis and embolism: Secondary | ICD-10-CM | POA: Diagnosis not present

## 2019-01-09 DIAGNOSIS — M329 Systemic lupus erythematosus, unspecified: Secondary | ICD-10-CM | POA: Diagnosis not present

## 2019-01-17 ENCOUNTER — Encounter (HOSPITAL_COMMUNITY): Payer: Medicare Other

## 2019-01-18 DIAGNOSIS — M3214 Glomerular disease in systemic lupus erythematosus: Secondary | ICD-10-CM | POA: Diagnosis not present

## 2019-01-18 DIAGNOSIS — Z9989 Dependence on other enabling machines and devices: Secondary | ICD-10-CM | POA: Diagnosis not present

## 2019-01-18 DIAGNOSIS — Z8673 Personal history of transient ischemic attack (TIA), and cerebral infarction without residual deficits: Secondary | ICD-10-CM | POA: Diagnosis not present

## 2019-01-18 DIAGNOSIS — Z86718 Personal history of other venous thrombosis and embolism: Secondary | ICD-10-CM | POA: Diagnosis not present

## 2019-01-18 DIAGNOSIS — Z7901 Long term (current) use of anticoagulants: Secondary | ICD-10-CM | POA: Diagnosis not present

## 2019-01-18 DIAGNOSIS — I10 Essential (primary) hypertension: Secondary | ICD-10-CM | POA: Diagnosis not present

## 2019-01-18 DIAGNOSIS — Z940 Kidney transplant status: Secondary | ICD-10-CM | POA: Diagnosis not present

## 2019-01-18 DIAGNOSIS — I4891 Unspecified atrial fibrillation: Secondary | ICD-10-CM | POA: Diagnosis not present

## 2019-01-18 DIAGNOSIS — Z79899 Other long term (current) drug therapy: Secondary | ICD-10-CM | POA: Diagnosis not present

## 2019-01-18 DIAGNOSIS — Z4822 Encounter for aftercare following kidney transplant: Secondary | ICD-10-CM | POA: Diagnosis not present

## 2019-01-18 DIAGNOSIS — D649 Anemia, unspecified: Secondary | ICD-10-CM | POA: Diagnosis not present

## 2019-01-18 DIAGNOSIS — G4733 Obstructive sleep apnea (adult) (pediatric): Secondary | ICD-10-CM | POA: Diagnosis not present

## 2019-01-18 DIAGNOSIS — Z792 Long term (current) use of antibiotics: Secondary | ICD-10-CM | POA: Diagnosis not present

## 2019-01-24 DIAGNOSIS — Z7901 Long term (current) use of anticoagulants: Secondary | ICD-10-CM | POA: Diagnosis not present

## 2019-01-24 DIAGNOSIS — Z940 Kidney transplant status: Secondary | ICD-10-CM | POA: Diagnosis not present

## 2019-01-31 ENCOUNTER — Ambulatory Visit (HOSPITAL_COMMUNITY)
Admission: RE | Admit: 2019-01-31 | Discharge: 2019-01-31 | Disposition: A | Discharge status: Home / Self Care | Payer: Medicare Other | Source: Ambulatory Visit | Attending: Nephrology | Admitting: Nephrology

## 2019-01-31 ENCOUNTER — Other Ambulatory Visit: Payer: Self-pay

## 2019-01-31 VITALS — BP 158/67 | HR 58 | Temp 96.9°F | Resp 20

## 2019-01-31 DIAGNOSIS — N183 Chronic kidney disease, stage 3 unspecified: Secondary | ICD-10-CM

## 2019-01-31 DIAGNOSIS — D631 Anemia in chronic kidney disease: Secondary | ICD-10-CM | POA: Diagnosis not present

## 2019-01-31 DIAGNOSIS — Z7901 Long term (current) use of anticoagulants: Secondary | ICD-10-CM | POA: Insufficient documentation

## 2019-01-31 LAB — POCT HEMOGLOBIN-HEMACUE: Hemoglobin: 10.9 g/dL — ABNORMAL LOW (ref 12.0–15.0)

## 2019-01-31 LAB — IRON AND TIBC
Iron: 117 ug/dL (ref 28–170)
Saturation Ratios: 52 % — ABNORMAL HIGH (ref 10.4–31.8)
TIBC: 225 ug/dL — ABNORMAL LOW (ref 250–450)
UIBC: 108 ug/dL

## 2019-01-31 LAB — RENAL FUNCTION PANEL
Albumin: 4 g/dL (ref 3.5–5.0)
Anion gap: 11 (ref 5–15)
BUN: 50 mg/dL — ABNORMAL HIGH (ref 8–23)
CO2: 24 mmol/L (ref 22–32)
Calcium: 9.4 mg/dL (ref 8.9–10.3)
Chloride: 101 mmol/L (ref 98–111)
Creatinine, Ser: 2.1 mg/dL — ABNORMAL HIGH (ref 0.44–1.00)
GFR calc Af Amer: 26 mL/min — ABNORMAL LOW (ref >60)
GFR calc non Af Amer: 22 mL/min — ABNORMAL LOW (ref >60)
Glucose, Bld: 101 mg/dL — ABNORMAL HIGH (ref 70–99)
Phosphorus: 3.9 mg/dL (ref 2.5–4.6)
Potassium: 4.9 mmol/L (ref 3.5–5.1)
Sodium: 136 mmol/L (ref 135–145)

## 2019-01-31 LAB — FERRITIN: Ferritin: 953 ng/mL — ABNORMAL HIGH (ref 11–307)

## 2019-01-31 LAB — PROTIME-INR
INR: 1.7 — ABNORMAL HIGH (ref 0.8–1.2)
Prothrombin Time: 19.3 seconds — ABNORMAL HIGH (ref 11.4–15.2)

## 2019-01-31 MED ORDER — EPOETIN ALFA 40000 UNIT/ML IJ SOLN
40000.0000 [IU] | INTRAMUSCULAR | Status: DC
  Administered 2019-01-31: 14:00:00 40000 [IU] via SUBCUTANEOUS

## 2019-01-31 MED ORDER — EPOETIN ALFA 40000 UNIT/ML IJ SOLN
INTRAMUSCULAR | Status: AC
  Administered 2019-01-31: 40000 [IU] via SUBCUTANEOUS
  Filled 2019-01-31: qty 1

## 2019-02-07 DIAGNOSIS — Z940 Kidney transplant status: Secondary | ICD-10-CM | POA: Diagnosis not present

## 2019-02-07 DIAGNOSIS — Z7901 Long term (current) use of anticoagulants: Secondary | ICD-10-CM | POA: Diagnosis not present

## 2019-02-21 ENCOUNTER — Ambulatory Visit (HOSPITAL_COMMUNITY)
Admission: RE | Admit: 2019-02-21 | Discharge: 2019-02-21 | Disposition: A | Discharge status: Home / Self Care | Payer: Medicare Other | Source: Ambulatory Visit | Attending: Nephrology | Admitting: Nephrology

## 2019-02-21 ENCOUNTER — Other Ambulatory Visit: Payer: Self-pay

## 2019-02-21 VITALS — BP 146/66 | HR 58 | Temp 97.0°F | Resp 20

## 2019-02-21 DIAGNOSIS — N183 Chronic kidney disease, stage 3 unspecified: Secondary | ICD-10-CM | POA: Diagnosis not present

## 2019-02-21 LAB — POCT HEMOGLOBIN-HEMACUE: Hemoglobin: 11.7 g/dL — ABNORMAL LOW (ref 12.0–15.0)

## 2019-02-21 MED ORDER — EPOETIN ALFA 10000 UNIT/ML IJ SOLN: 40000.0000 [IU] | INTRAMUSCULAR | Status: DC

## 2019-02-21 MED ORDER — EPOETIN ALFA 40000 UNIT/ML IJ SOLN
INTRAMUSCULAR | Status: AC
  Administered 2019-02-21: 40000 [IU]
  Filled 2019-02-21: qty 1

## 2019-02-27 DIAGNOSIS — Z940 Kidney transplant status: Secondary | ICD-10-CM | POA: Diagnosis not present

## 2019-02-27 DIAGNOSIS — Z7901 Long term (current) use of anticoagulants: Secondary | ICD-10-CM | POA: Diagnosis not present

## 2019-02-28 ENCOUNTER — Ambulatory Visit (HOSPITAL_COMMUNITY)
Admission: RE | Admit: 2019-02-28 | Discharge: 2019-02-28 | Disposition: A | Discharge status: Home / Self Care | Payer: Medicare Other | Source: Ambulatory Visit | Attending: Nephrology | Admitting: Nephrology

## 2019-02-28 ENCOUNTER — Other Ambulatory Visit: Payer: Self-pay

## 2019-02-28 DIAGNOSIS — R791 Abnormal coagulation profile: Secondary | ICD-10-CM | POA: Diagnosis not present

## 2019-02-28 MED ORDER — VITAMIN K1 10 MG/ML IJ SOLN
5.0000 mg | Freq: Once | INTRAMUSCULAR | Status: AC
  Administered 2019-02-28: 5 mg via SUBCUTANEOUS
  Filled 2019-02-28: qty 0.5

## 2019-03-05 DIAGNOSIS — Z7901 Long term (current) use of anticoagulants: Secondary | ICD-10-CM | POA: Diagnosis not present

## 2019-03-14 ENCOUNTER — Other Ambulatory Visit: Payer: Self-pay

## 2019-03-14 ENCOUNTER — Ambulatory Visit (HOSPITAL_COMMUNITY)
Admission: RE | Admit: 2019-03-14 | Discharge: 2019-03-14 | Disposition: A | Discharge status: Home / Self Care | Payer: Medicare Other | Source: Ambulatory Visit | Attending: Nephrology | Admitting: Nephrology

## 2019-03-14 VITALS — BP 153/75 | HR 62 | Resp 20

## 2019-03-14 DIAGNOSIS — N183 Chronic kidney disease, stage 3 unspecified: Secondary | ICD-10-CM | POA: Insufficient documentation

## 2019-03-14 DIAGNOSIS — Z7901 Long term (current) use of anticoagulants: Secondary | ICD-10-CM | POA: Insufficient documentation

## 2019-03-14 DIAGNOSIS — D631 Anemia in chronic kidney disease: Secondary | ICD-10-CM | POA: Diagnosis not present

## 2019-03-14 LAB — RENAL FUNCTION PANEL
Albumin: 3.9 g/dL (ref 3.5–5.0)
Anion gap: 12 (ref 5–15)
BUN: 67 mg/dL — ABNORMAL HIGH (ref 8–23)
CO2: 24 mmol/L (ref 22–32)
Calcium: 9.5 mg/dL (ref 8.9–10.3)
Chloride: 100 mmol/L (ref 98–111)
Creatinine, Ser: 2.55 mg/dL — ABNORMAL HIGH (ref 0.44–1.00)
GFR calc Af Amer: 21 mL/min — ABNORMAL LOW (ref >60)
GFR calc non Af Amer: 18 mL/min — ABNORMAL LOW (ref >60)
Glucose, Bld: 97 mg/dL (ref 70–99)
Phosphorus: 4.4 mg/dL (ref 2.5–4.6)
Potassium: 4.6 mmol/L (ref 3.5–5.1)
Sodium: 136 mmol/L (ref 135–145)

## 2019-03-14 LAB — IRON AND TIBC
Iron: 142 ug/dL (ref 28–170)
Saturation Ratios: 63 % — ABNORMAL HIGH (ref 10.4–31.8)
TIBC: 225 ug/dL — ABNORMAL LOW (ref 250–450)
UIBC: 83 ug/dL

## 2019-03-14 LAB — POCT HEMOGLOBIN-HEMACUE: Hemoglobin: 12.3 g/dL (ref 12.0–15.0)

## 2019-03-14 LAB — PROTIME-INR
INR: 2.3 — ABNORMAL HIGH (ref 0.8–1.2)
Prothrombin Time: 24.6 seconds — ABNORMAL HIGH (ref 11.4–15.2)

## 2019-03-14 LAB — FERRITIN: Ferritin: 831 ng/mL — ABNORMAL HIGH (ref 11–307)

## 2019-03-14 MED ORDER — EPOETIN ALFA 40000 UNIT/ML IJ SOLN: 40000.0000 [IU] | INTRAMUSCULAR | Status: DC

## 2019-03-22 ENCOUNTER — Ambulatory Visit (INDEPENDENT_AMBULATORY_CARE_PROVIDER_SITE_OTHER): Payer: Medicare Other | Admitting: Family Medicine

## 2019-03-22 ENCOUNTER — Other Ambulatory Visit (HOSPITAL_BASED_OUTPATIENT_CLINIC_OR_DEPARTMENT_OTHER): Payer: Self-pay | Admitting: Family Medicine

## 2019-03-22 ENCOUNTER — Encounter: Payer: Self-pay | Admitting: Family Medicine

## 2019-03-22 ENCOUNTER — Other Ambulatory Visit: Payer: Self-pay

## 2019-03-22 VITALS — BP 138/60 | HR 65 | Temp 97.0°F | Resp 18 | Ht 63.0 in | Wt 137.4 lb

## 2019-03-22 DIAGNOSIS — Z1231 Encounter for screening mammogram for malignant neoplasm of breast: Secondary | ICD-10-CM

## 2019-03-22 DIAGNOSIS — I10 Essential (primary) hypertension: Secondary | ICD-10-CM | POA: Diagnosis not present

## 2019-03-22 DIAGNOSIS — K5090 Crohn's disease, unspecified, without complications: Secondary | ICD-10-CM

## 2019-03-22 DIAGNOSIS — E785 Hyperlipidemia, unspecified: Secondary | ICD-10-CM

## 2019-03-22 DIAGNOSIS — M109 Gout, unspecified: Secondary | ICD-10-CM

## 2019-03-22 DIAGNOSIS — E039 Hypothyroidism, unspecified: Secondary | ICD-10-CM | POA: Diagnosis not present

## 2019-03-22 DIAGNOSIS — N183 Chronic kidney disease, stage 3 unspecified: Secondary | ICD-10-CM | POA: Diagnosis not present

## 2019-03-22 LAB — COMPREHENSIVE METABOLIC PANEL
ALT: 11 U/L (ref 0–35)
AST: 15 U/L (ref 0–37)
Albumin: 4.2 g/dL (ref 3.5–5.2)
Alkaline Phosphatase: 144 U/L — ABNORMAL HIGH (ref 39–117)
BUN: 56 mg/dL — ABNORMAL HIGH (ref 6–23)
CO2: 23 mEq/L (ref 19–32)
Calcium: 9.3 mg/dL (ref 8.4–10.5)
Chloride: 103 mEq/L (ref 96–112)
Creatinine, Ser: 2.3 mg/dL — ABNORMAL HIGH (ref 0.40–1.20)
GFR: 24.95 mL/min — ABNORMAL LOW (ref >60.00)
Glucose, Bld: 99 mg/dL (ref 70–99)
Potassium: 4.6 mEq/L (ref 3.5–5.1)
Sodium: 139 mEq/L (ref 135–145)
Total Bilirubin: 0.5 mg/dL (ref 0.2–1.2)
Total Protein: 6.8 g/dL (ref 6.0–8.3)

## 2019-03-22 LAB — LIPID PANEL
Cholesterol: 164 mg/dL (ref 0–200)
HDL: 48.3 mg/dL (ref >39.00)
LDL Cholesterol: 84 mg/dL (ref 0–99)
NonHDL: 115.99
Total CHOL/HDL Ratio: 3
Triglycerides: 161 mg/dL — ABNORMAL HIGH (ref 0.0–149.0)
VLDL: 32.2 mg/dL (ref 0.0–40.0)

## 2019-03-22 LAB — TSH: TSH: 0.69 u[IU]/mL (ref 0.35–4.50)

## 2019-03-22 LAB — URIC ACID: Uric Acid, Serum: 3.9 mg/dL (ref 2.4–7.0)

## 2019-03-22 NOTE — Progress Notes (Signed)
Patient ID: [name], female    DOB: 1940-07-25  Age: 78 y.o. MRN: [id number]    Subjective:  Subjective  HPI [name] presents for f/u bp, chol , gout and thyroid.   She needs a new GI because hers is retiring ---she is having no problems with her crohns she just needs to establish with a gI  Review of Systems  Constitutional: Negative for appetite change, diaphoresis, fatigue and unexpected weight change.  Eyes: Negative for pain, redness and visual disturbance.  Respiratory: Negative for cough, chest tightness, shortness of breath and wheezing.   Cardiovascular: Negative for chest pain, palpitations and leg swelling.  Endocrine: Negative for cold intolerance, heat intolerance, polydipsia, polyphagia and polyuria.  Genitourinary: Negative for difficulty urinating, dysuria and frequency.  Neurological: Negative for dizziness, light-headedness, numbness and headaches.    History Past Medical History:  Diagnosis Date  . Abdominal wall hernia   . Anemia    "when lupus flares up"  . Anxiety   . Arthritis    "in my joints"  . Atrial fibrillation ([place])   . CAP (community acquired pneumonia) 08/20/2014  . Crohn's disease ([place])   . DVT of leg (deep venous thrombosis) ([place]) 2012-8./13/2013   "have had one in each leg now"  . DVT of lower extremity, bilateral ([place])    "have had them in both legs; last one was on the RLE this year" (04/25/2012)  . GERD (gastroesophageal reflux disease)   . Gout 2016  . History of hiatal hernia   . History of kidney stones   . Hypertension   . Hypothyroidism   . Lower GI bleed 2012  . Numbness and tingling of foot    bilaterally  . OSA on CPAP   . Refusal of blood transfusion for reasons of conscience 12/20/2011   pt is Jehovah's Witness  . Renal cell carcinoma   . Renal disorder    S/P nephrectomy; dialysis; "working fine now" (12/20/11)  . SBO (small bowel obstruction) ([place]) 11/2016  . SLE (systemic lupus erythematosus) ([place])     She has a past surgical history that includes Cesarean section (1984); Nephrectomy transplanted organ (2006); Colectomy (1998); Excisional hemorrhoidectomy; Cataract extraction w/ intraocular lens  implant, bilateral (2005-2010); Vena cava filter placement (2012); Insertion of dialysis catheter ([id number]); Cholecystectomy (1995); Colostomy (1998); Colostomy reversal (1999); Bone marrow biopsy (1993; 1995); Abdominal exploration surgery (1995); Parathyroid implant removal (Left); Colonoscopy (N/A, 08/30/2013); Breast excisional biopsy; and Colonoscopy with propofol (N/A, 09/06/2017).   Her family history includes Heart disease in her mother; Hypertension in an other family member; Sleep apnea in her sister.She reports that she has never smoked. She has never used smokeless tobacco. She reports that she does not drink alcohol or use drugs.  Current Outpatient Medications on File Prior to Visit  Medication Sig Dispense Refill  . acetaminophen (TYLENOL) 325 MG tablet Take 650 mg by mouth every 6 (six) hours as needed for mild pain.    Marland Kitchen allopurinol (ZYLOPRIM) 100 MG tablet Take 1 tablet (100 mg total) by mouth 2 (two) times daily. 30 tablet 2  . calcitRIOL (ROCALTROL) 0.25 MCG capsule Take 0.25-0.5 mcg by mouth See admin instructions. Take 0.5 mcg by mouth daily on Monday, Wednesday, Friday and Take 0.25 mcg by mouth daily on all other days    . epoetin alfa (EPOGEN,PROCRIT) 52841 UNIT/ML injection Inject 40,000 Units into the skin every 21 ( twenty-one) days.     . felodipine (PLENDIL) 10 MG 24 hr tablet  Take 10 mg by mouth daily.    . furosemide (LASIX) 40 MG tablet Take 40 mg by mouth 2 (two) times daily.     Marland Kitchen gabapentin (NEURONTIN) 100 MG capsule Take 2 capsules (200 mg total) by mouth at bedtime.    Marland Kitchen levothyroxine (SYNTHROID, LEVOTHROID) 100 MCG tablet Take 1 tablet (100 mcg total) by mouth daily.    Marland Kitchen MAGNESIUM-OXIDE 400 (241.3 MG) MG tablet Take 400 mg by mouth 2 (two) times daily.  0  . Melatonin  5 MG CAPS Take 5 mg by mouth daily.     . metoprolol tartrate (LOPRESSOR) 50 MG tablet Take 50 mg by mouth 2 (two) times daily.    . mycophenolate (MYFORTIC) 180 MG EC tablet Take 360 mg by mouth 2 (two) times daily.      Marland Kitchen omeprazole (PRILOSEC) 20 MG capsule Take 20 mg by mouth daily.    . polyethylene glycol (MIRALAX / GLYCOLAX) packet Take 17 g by mouth daily as needed for moderate constipation.     . Polyethylene Glycol 400 (BLINK TEARS) 0.25 % GEL Place 1 drop into both eyes 2 (two) times daily as needed (for dry eyes).    . predniSONE (DELTASONE) 5 MG tablet Take 5 mg by mouth daily.     . Probiotic Product (PROBIOTIC PO) Take 2 tablets by mouth daily.    . tacrolimus (PROGRAF) 1 MG capsule Take 3 mg by mouth 2 (two) times daily.     Marland Kitchen warfarin (COUMADIN) 2.5 MG tablet Take 0.5-1 tablets (1.25-2.5 mg total) by mouth See admin instructions. Take 2.5 mg by mouth daily on Monday, Wednesday, Friday and Take 1.25 mg by mouth daily on all other days    . cefdinir (OMNICEF) 300 MG capsule Take 1 capsule (300 mg total) by mouth 2 (two) times daily. (Patient not taking: Reported on 03/22/2019) 10 capsule 0   No current facility-administered medications on file prior to visit.      Objective:  Objective  Physical Exam Vitals signs and nursing note reviewed.  Constitutional:      Appearance: She is well-developed.  HENT:     Head: Normocephalic and atraumatic.  Eyes:     Conjunctiva/sclera: Conjunctivae normal.  Neck:     Musculoskeletal: Normal range of motion and neck supple.     Thyroid: No thyromegaly.     Vascular: No carotid bruit or JVD.  Cardiovascular:     Rate and Rhythm: Normal rate and regular rhythm.     Heart sounds: Murmur present.  Pulmonary:     Effort: Pulmonary effort is normal. No respiratory distress.     Breath sounds: Normal breath sounds. No wheezing or rales.  Chest:     Chest wall: No tenderness.  Neurological:     Mental Status: She is alert and oriented to  person, place, and time.    BP 138/60 (BP Location: Right Arm, Patient Position: Sitting, Cuff Size: Normal)   Pulse 65   Temp (!) 97 F (36.1 C) (Temporal)   Resp 18   Ht 5' 3"  (1.6 m)   Wt 137 lb 6.4 oz (62.3 kg)   LMP 03/05/2014   SpO2 99%   BMI 24.34 kg/m  Wt Readings from Last 3 Encounters:  03/22/19 137 lb 6.4 oz (62.3 kg)  06/28/18 137 lb 3.2 oz (62.2 kg)  06/07/18 139 lb (63 kg)     Lab Results  Component Value Date   WBC 6.2 02/12/2018   HGB 12.3 03/14/2019  HCT 30.4 (L) 02/12/2018   PLT 135 (L) 02/12/2018   GLUCOSE 99 03/22/2019   CHOL 164 03/22/2019   TRIG 161.0 (H) 03/22/2019   HDL 48.30 03/22/2019   LDLCALC 84 03/22/2019   ALT 11 03/22/2019   AST 15 03/22/2019   NA 139 03/22/2019   K 4.6 03/22/2019   CL 103 03/22/2019   CREATININE 2.30 (H) 03/22/2019   BUN 56 (H) 03/22/2019   CO2 23 03/22/2019   TSH 0.69 03/22/2019   INR 2.3 (H) 03/14/2019   HGBA1C 6.5 (H) 11/12/2014    No results found.   Assessment & Plan:  Plan  I am having Domnique D. Wildrick maintain her calcitRIOL, polyethylene glycol, mycophenolate, predniSONE, epoetin alfa, tacrolimus, acetaminophen, MAGnesium-Oxide, allopurinol, gabapentin, levothyroxine, omeprazole, metoprolol tartrate, Probiotic Product (PROBIOTIC PO), Polyethylene Glycol 400, Melatonin, warfarin, cefdinir, furosemide, and felodipine.  No orders of the defined types were placed in this encounter.   Problem List Items Addressed This Visit      Unprioritized   CKD (chronic kidney disease) stage 3, GFR 30-59 ml/min (HCC)    Per nephrology      Crohn's disease ([place]) (Chronic)    Pt needs a new GI She requested a referral      Relevant Orders   Ambulatory referral to Gastroenterology   HTN (hypertension) (Chronic)   Relevant Orders   Lipid panel (Completed)   Comprehensive metabolic panel (Completed)   Hyperlipidemia LDL goal <100    Encouraged heart healthy diet, increase exercise, avoid trans fats, consider a  krill oil cap daily      Hypothyroidism    Check labs con't meds      Relevant Orders   TSH (Completed)    Other Visit Diagnoses    Gout, unspecified cause, unspecified chronicity, unspecified site    -  Primary   Relevant Orders   Uric Acid (Completed)      Follow-up: Return in about 6 months (around 09/19/2019), or if symptoms worsen or fail to improve, for hypertension.  Ann Held, DO

## 2019-03-22 NOTE — Assessment & Plan Note (Signed)
Check labs con't meds 

## 2019-03-22 NOTE — Patient Instructions (Signed)
Carpal Tunnel Syndrome  Carpal tunnel syndrome is a condition that causes pain in your hand and arm. The carpal tunnel is a narrow area located on the palm side of your wrist. Repeated wrist motion or certain diseases may cause swelling within the tunnel. This swelling pinches the main nerve in the wrist (median nerve). What are the causes? This condition may be caused by:  Repeated wrist motions.  Wrist injuries.  Arthritis.  A cyst or tumor in the carpal tunnel.  Fluid buildup during pregnancy. Sometimes the cause of this condition is not known. What increases the risk? The following factors may make you more likely to develop this condition:  Having a job, such as being a Research scientist (life sciences), that requires you to repeatedly move your wrist in the same motion.  Being a woman.  Having certain conditions, such as: ? Diabetes. ? Obesity. ? An underactive thyroid (hypothyroidism). ? Kidney failure. What are the signs or symptoms? Symptoms of this condition include:  A tingling feeling in your fingers, especially in your thumb, index, and middle fingers.  Tingling or numbness in your hand.  An aching feeling in your entire arm, especially when your wrist and elbow are bent for a long time.  Wrist pain that goes up your arm to your shoulder.  Pain that goes down into your palm or fingers.  A weak feeling in your hands. You may have trouble grabbing and holding items. Your symptoms may feel worse during the night. How is this diagnosed? This condition is diagnosed with a medical history and physical exam. You may also have tests, including:  Electromyogram (EMG). This test measures electrical signals sent by your nerves into the muscles.  Nerve conduction study. This test measures how well electrical signals pass through your nerves.  Imaging tests, such as X-rays, ultrasound, and MRI. These tests check for possible causes of your condition. How is this treated? This  condition may be treated with:  Lifestyle changes. It is important to stop or change the activity that caused your condition.  Doing exercise and activities to strengthen your muscles and bones (physical therapy).  Learning how to use your hand again after diagnosis (occupational therapy).  Medicines for pain and inflammation. This may include medicine that is injected into your wrist.  A wrist splint.  Surgery. Follow these instructions at home: If you have a splint:  Wear the splint as told by your health care provider. Remove it only as told by your health care provider.  Loosen the splint if your fingers tingle, become numb, or turn cold and blue.  Keep the splint clean.  If the splint is not waterproof: ? Do not let it get wet. ? Cover it with a watertight covering when you take a bath or shower. Managing pain, stiffness, and swelling   If directed, put ice on the painful area: ? If you have a removable splint, remove it as told by your health care provider. ? Put ice in a plastic bag. ? Place a towel between your skin and the bag. ? Leave the ice on for 20 minutes, 2-3 times per day. General instructions  Take over-the-counter and prescription medicines only as told by your health care provider.  Rest your wrist from any activity that may be causing your pain. If your condition is work related, talk with your employer about changes that can be made, such as getting a wrist pad to use while typing.  Do any exercises as told  by your health care provider, physical therapist, or occupational therapist.  Keep all follow-up visits as told by your health care provider. This is important. Contact a health care provider if:  You have new symptoms.  Your pain is not controlled with medicines.  Your symptoms get worse. Get help right away if:  You have severe numbness or tingling in your wrist or hand. Summary  Carpal tunnel syndrome is a condition that causes pain in  your hand and arm.  It is usually caused by repeated wrist motions.  Lifestyle changes and medicines are used to treat carpal tunnel syndrome. Surgery may be recommended.  Follow your health care provider's instructions about wearing a splint, resting from activity, keeping follow-up visits, and calling for help. This information is not intended to replace advice given to you by your health care provider. Make sure you discuss any questions you have with your health care provider. Document Released: 04/22/2000 Document Revised: 09/01/2017 Document Reviewed: 09/01/2017 Elsevier Patient Education  2020 [employer].

## 2019-03-22 NOTE — Assessment & Plan Note (Signed)
Per nephrology 

## 2019-03-22 NOTE — Assessment & Plan Note (Signed)
Pt needs a new GI She requested a referral

## 2019-03-22 NOTE — Assessment & Plan Note (Signed)
Encouraged heart healthy diet, increase exercise, avoid trans fats, consider a krill oil cap daily 

## 2019-03-26 ENCOUNTER — Other Ambulatory Visit: Payer: Self-pay

## 2019-03-28 ENCOUNTER — Other Ambulatory Visit: Payer: Self-pay

## 2019-03-28 ENCOUNTER — Encounter (HOSPITAL_COMMUNITY)
Admission: RE | Admit: 2019-03-28 | Discharge: 2019-03-28 | Disposition: A | Discharge status: Home / Self Care | Payer: Medicare Other | Source: Ambulatory Visit | Attending: Nephrology | Admitting: Nephrology

## 2019-03-28 VITALS — BP 125/59 | HR 58 | Temp 96.6°F | Resp 18

## 2019-03-28 DIAGNOSIS — N183 Chronic kidney disease, stage 3 unspecified: Secondary | ICD-10-CM | POA: Insufficient documentation

## 2019-03-28 LAB — POCT HEMOGLOBIN-HEMACUE: Hemoglobin: 10.8 g/dL — ABNORMAL LOW (ref 12.0–15.0)

## 2019-03-28 MED ORDER — EPOETIN ALFA 10000 UNIT/ML IJ SOLN: 40000.0000 [IU] | INTRAMUSCULAR | Status: DC

## 2019-03-28 MED ORDER — EPOETIN ALFA 40000 UNIT/ML IJ SOLN
INTRAMUSCULAR | Status: AC
  Administered 2019-03-28: 40000 [IU] via SUBCUTANEOUS
  Filled 2019-03-28: qty 1

## 2019-04-02 ENCOUNTER — Other Ambulatory Visit: Payer: Self-pay

## 2019-04-02 ENCOUNTER — Encounter (HOSPITAL_BASED_OUTPATIENT_CLINIC_OR_DEPARTMENT_OTHER): Payer: Self-pay

## 2019-04-02 ENCOUNTER — Ambulatory Visit (HOSPITAL_BASED_OUTPATIENT_CLINIC_OR_DEPARTMENT_OTHER)
Admission: RE | Admit: 2019-04-02 | Discharge: 2019-04-02 | Disposition: A | Discharge status: Home / Self Care | Payer: Medicare Other | Source: Ambulatory Visit | Attending: Family Medicine | Admitting: Family Medicine

## 2019-04-02 DIAGNOSIS — Z1231 Encounter for screening mammogram for malignant neoplasm of breast: Secondary | ICD-10-CM

## 2019-04-03 DIAGNOSIS — R197 Diarrhea, unspecified: Secondary | ICD-10-CM | POA: Diagnosis not present

## 2019-04-05 ENCOUNTER — Encounter (HOSPITAL_COMMUNITY): Payer: Medicare Other

## 2019-04-10 DIAGNOSIS — I129 Hypertensive chronic kidney disease with stage 1 through stage 4 chronic kidney disease, or unspecified chronic kidney disease: Secondary | ICD-10-CM | POA: Diagnosis not present

## 2019-04-10 DIAGNOSIS — N184 Chronic kidney disease, stage 4 (severe): Secondary | ICD-10-CM | POA: Diagnosis not present

## 2019-04-10 DIAGNOSIS — E782 Mixed hyperlipidemia: Secondary | ICD-10-CM | POA: Diagnosis not present

## 2019-04-10 DIAGNOSIS — M109 Gout, unspecified: Secondary | ICD-10-CM | POA: Diagnosis not present

## 2019-04-10 DIAGNOSIS — Z940 Kidney transplant status: Secondary | ICD-10-CM | POA: Diagnosis not present

## 2019-04-10 DIAGNOSIS — M329 Systemic lupus erythematosus, unspecified: Secondary | ICD-10-CM | POA: Diagnosis not present

## 2019-04-10 DIAGNOSIS — N2589 Other disorders resulting from impaired renal tubular function: Secondary | ICD-10-CM | POA: Diagnosis not present

## 2019-04-10 DIAGNOSIS — Z7901 Long term (current) use of anticoagulants: Secondary | ICD-10-CM | POA: Diagnosis not present

## 2019-04-10 DIAGNOSIS — Z86718 Personal history of other venous thrombosis and embolism: Secondary | ICD-10-CM | POA: Diagnosis not present

## 2019-04-10 DIAGNOSIS — N2581 Secondary hyperparathyroidism of renal origin: Secondary | ICD-10-CM | POA: Diagnosis not present

## 2019-04-10 DIAGNOSIS — I4891 Unspecified atrial fibrillation: Secondary | ICD-10-CM | POA: Diagnosis not present

## 2019-04-10 DIAGNOSIS — D631 Anemia in chronic kidney disease: Secondary | ICD-10-CM | POA: Diagnosis not present

## 2019-04-12 ENCOUNTER — Other Ambulatory Visit: Payer: Self-pay

## 2019-04-12 ENCOUNTER — Encounter: Payer: Self-pay | Admitting: Cardiovascular Disease

## 2019-04-12 ENCOUNTER — Ambulatory Visit (INDEPENDENT_AMBULATORY_CARE_PROVIDER_SITE_OTHER): Payer: Medicare Other | Admitting: Cardiovascular Disease

## 2019-04-12 VITALS — BP 130/72 | HR 65 | Ht 63.0 in | Wt 137.0 lb

## 2019-04-12 DIAGNOSIS — I351 Nonrheumatic aortic (valve) insufficiency: Secondary | ICD-10-CM

## 2019-04-12 NOTE — Patient Instructions (Addendum)
Medication Instructions:   *If you need a refill on your cardiac medications before your next appointment, please call your pharmacy*  Lab Work:  If you have labs (blood work) drawn today and your tests are completely normal, you will receive your results only by: Marland Kitchen MyChart Message (if you have MyChart) OR . A paper copy in the mail If you have any lab test that is abnormal or we need to change your treatment, we will call you to review the results.  Testing/Procedures: Your physician has requested that you have an echocardiogram in August 2021. Echocardiography is a painless test that uses sound waves to create images of your heart. It provides your doctor with information about the size and shape of your heart and how well your heart's chambers and valves are working. This procedure takes approximately one hour. There are no restrictions for this procedure.  Follow-Up: At [facility], you and your health needs are our priority.  As part of our continuing mission to provide you with exceptional heart care, we have created designated Provider Care Teams.  These Care Teams include your primary Cardiologist (physician) and Advanced Practice Providers (APPs -  Physician Assistants and Nurse Practitioners) who all work together to provide you with the care you need, when you need it.  Your next appointment:   August 2021  The format for your next appointment:   In Person  Provider:   You may see Dr. Johnsie Cancel or one of the following Advanced Practice Providers on your designated Care Team:    Truitt Merle, NP  Cecilie Kicks, NP  Kathyrn Drown, NP

## 2019-04-12 NOTE — Progress Notes (Signed)
Patient ID: [name], female   DOB: 06/01/1940, 78 y.o.   MRN: [id number]   78 y.o.  with HTN and PAF  Initially seen 09/2014 Previously seen by [facility] and most recently by [facility] Dr Matthew Saras.  Longstanding PAF and history of DVT On chronic coumadin. CRF with previous dialysis and transplant 2006 Baseline Cr 2.1 Was on low dose amiodarone but caused decreased DLCO and thyroid issue She has been maint NSR with no dyspnea chest pain or palpitations   She uses Dr Dederding as primary.   April 2018:  Hospitalized with SBO From adhesions , hernia and chrones UTI on ceftin now   Complains of leg pain bilateral worse when walking worse in left Knee sounds arthritic and has had injections   TTE 12/27/18 moderate AR LV normal size moderate LVH asymmetric EF >65% normal root   No cardiac complaints    ROS: Denies fever, malais, weight loss, blurry vision, decreased visual acuity, cough, sputum, SOB, hemoptysis, pleuritic pain, palpitaitons, heartburn, abdominal pain, melena, lower extremity edema, claudication, or rash.  All other systems reviewed and negative  General: Affect appropriate Mildly cushingoid  HEENT: normal Neck supple with no adenopathy JVP normal no bruits no thyromegaly Lungs clear with no wheezing and good diaphragmatic motion Heart:  S1/S2 AR  murmur, no rub, gallop or click old dialysis graft LUE PMI normal Abdomen: benighn, BS positve, no tenderness, no AAA old dialysis graft left groin With lateral hernia on right  no bruit.  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 acetaminophen (TYLENOL) 325 MG tablet Take 650 mg by mouth every 6 (six) hours as needed for mild pain.    Marland Kitchen allopurinol (ZYLOPRIM) 100 MG tablet Take 1 tablet (100 mg total) by mouth 2 (two) times daily. 30 tablet 2  . calcitRIOL (ROCALTROL) 0.25 MCG  capsule Take 0.25-0.5 mcg by mouth See admin instructions. Take 0.5 mcg by mouth daily on Monday, Wednesday, Friday and Take 0.25 mcg by mouth daily on all other days    . cefdinir (OMNICEF) 300 MG capsule Take 1 capsule (300 mg total) by mouth 2 (two) times daily. 10 capsule 0  . epoetin alfa (EPOGEN,PROCRIT) 47425 UNIT/ML injection Inject 40,000 Units into the skin every 21 ( twenty-one) days.     . felodipine (PLENDIL) 10 MG 24 hr tablet Take 10 mg by mouth daily.    . furosemide (LASIX) 40 MG tablet Take 40 mg by mouth 2 (two) times daily.     Marland Kitchen gabapentin (NEURONTIN) 100 MG capsule Take 2 capsules (200 mg total) by mouth at bedtime.    Marland Kitchen levothyroxine (SYNTHROID, LEVOTHROID) 100 MCG tablet Take 1 tablet (100 mcg total) by mouth daily.    Marland Kitchen MAGNESIUM-OXIDE 400 (241.3 MG) MG tablet Take 400 mg by mouth 2 (two) times daily.  0  . Melatonin 5 MG CAPS Take 5 mg by mouth daily.     . metoprolol tartrate (LOPRESSOR) 50 MG tablet Take 50 mg by mouth 2 (two) times daily.    . mycophenolate (MYFORTIC) 180 MG EC tablet Take 360 mg by mouth 2 (two) times daily.      Marland Kitchen omeprazole (PRILOSEC) 20 MG capsule Take 20 mg by mouth daily.    . polyethylene glycol (MIRALAX / GLYCOLAX) packet Take 17 g by mouth daily as needed for moderate constipation.     Marland Kitchen  Polyethylene Glycol 400 (BLINK TEARS) 0.25 % GEL Place 1 drop into both eyes 2 (two) times daily as needed (for dry eyes).    . predniSONE (DELTASONE) 5 MG tablet Take 5 mg by mouth daily.     . Probiotic Product (PROBIOTIC PO) Take 2 tablets by mouth daily.    . tacrolimus (PROGRAF) 1 MG capsule Take 3 mg by mouth 2 (two) times daily.     Marland Kitchen warfarin (COUMADIN) 2.5 MG tablet Take 0.5-1 tablets (1.25-2.5 mg total) by mouth See admin instructions. Take 2.5 mg by mouth daily on Monday, Wednesday, Friday and Take 1.25 mg by mouth daily on all other days     No current facility-administered medications for this visit.     Allergies  Amoxicillin, Ampicillin,  Ciprofloxacin, Erythromycin, Penicillins, Tetracycline, Labetalol, Sulfa antibiotics, and Sulfamethoxazole-trimethoprim  Electrocardiogram:  04/12/19 SR rate 65 RBBB no acute changes   Assessment and Plan  AR:  Moderate no change in murmur f/u echo  August 2021  CRF:  F/U Dr Dederding  Baseline Cr mid 2's on prograf and deltasone Gout:  On allopurinol  Had pulse dose prednisone for Rx PAF: maint NSR  Amiodarone stopped due to low DLCO on coumadin  DVT:  History of 12/2011 extending from common femoral to calf veins on coumadin  RBBB:  Chronic no change no high grade AV block yearly ECG  SBO:  Resolved on conservative Rx History of [place]  Leg Pain: likely arthritis but pulses are hard to palpate f/u LE arterial duplex and ABI's   Jenkins Rouge

## 2019-04-16 DIAGNOSIS — Z940 Kidney transplant status: Secondary | ICD-10-CM | POA: Diagnosis not present

## 2019-04-18 ENCOUNTER — Other Ambulatory Visit: Payer: Self-pay

## 2019-04-18 ENCOUNTER — Encounter (HOSPITAL_COMMUNITY)
Admission: RE | Admit: 2019-04-18 | Discharge: 2019-04-18 | Disposition: A | Discharge status: Home / Self Care | Payer: Medicare Other | Source: Ambulatory Visit | Attending: Nephrology | Admitting: Nephrology

## 2019-04-18 VITALS — BP 128/62 | HR 66 | Temp 97.2°F | Resp 20

## 2019-04-18 DIAGNOSIS — N183 Chronic kidney disease, stage 3 unspecified: Secondary | ICD-10-CM | POA: Diagnosis not present

## 2019-04-18 DIAGNOSIS — Z7901 Long term (current) use of anticoagulants: Secondary | ICD-10-CM | POA: Insufficient documentation

## 2019-04-18 LAB — PROTIME-INR
INR: 3.6 — ABNORMAL HIGH (ref 0.8–1.2)
Prothrombin Time: 35.8 seconds — ABNORMAL HIGH (ref 11.4–15.2)

## 2019-04-18 LAB — RENAL FUNCTION PANEL
Albumin: 3.8 g/dL (ref 3.5–5.0)
Anion gap: 11 (ref 5–15)
BUN: 58 mg/dL — ABNORMAL HIGH (ref 8–23)
CO2: 23 mmol/L (ref 22–32)
Calcium: 9.2 mg/dL (ref 8.9–10.3)
Chloride: 103 mmol/L (ref 98–111)
Creatinine, Ser: 2.38 mg/dL — ABNORMAL HIGH (ref 0.44–1.00)
GFR calc Af Amer: 22 mL/min — ABNORMAL LOW (ref >60)
GFR calc non Af Amer: 19 mL/min — ABNORMAL LOW (ref >60)
Glucose, Bld: 92 mg/dL (ref 70–99)
Phosphorus: 4.2 mg/dL (ref 2.5–4.6)
Potassium: 4.1 mmol/L (ref 3.5–5.1)
Sodium: 137 mmol/L (ref 135–145)

## 2019-04-18 LAB — IRON AND TIBC
Iron: 123 ug/dL (ref 28–170)
Saturation Ratios: 62 % — ABNORMAL HIGH (ref 10.4–31.8)
TIBC: 199 ug/dL — ABNORMAL LOW (ref 250–450)
UIBC: 76 ug/dL

## 2019-04-18 LAB — POCT HEMOGLOBIN-HEMACUE: Hemoglobin: 10.7 g/dL — ABNORMAL LOW (ref 12.0–15.0)

## 2019-04-18 LAB — FERRITIN: Ferritin: 712 ng/mL — ABNORMAL HIGH (ref 11–307)

## 2019-04-18 MED ORDER — EPOETIN ALFA 40000 UNIT/ML IJ SOLN
INTRAMUSCULAR | Status: AC
  Administered 2019-04-18: 13:00:00 40000 [IU] via SUBCUTANEOUS
  Filled 2019-04-18: qty 1

## 2019-04-18 MED ORDER — EPOETIN ALFA 10000 UNIT/ML IJ SOLN: 40000.0000 [IU] | INTRAMUSCULAR | Status: DC

## 2019-05-09 ENCOUNTER — Ambulatory Visit (HOSPITAL_COMMUNITY)
Admission: RE | Admit: 2019-05-09 | Discharge: 2019-05-09 | Disposition: A | Discharge status: Home / Self Care | Payer: Medicare Other | Source: Ambulatory Visit | Attending: Nephrology | Admitting: Nephrology

## 2019-05-09 ENCOUNTER — Other Ambulatory Visit: Payer: Self-pay

## 2019-05-09 VITALS — BP 138/72 | HR 67 | Temp 96.6°F | Resp 20

## 2019-05-09 DIAGNOSIS — N183 Chronic kidney disease, stage 3 unspecified: Secondary | ICD-10-CM | POA: Insufficient documentation

## 2019-05-09 LAB — POCT HEMOGLOBIN-HEMACUE: Hemoglobin: 11.1 g/dL — ABNORMAL LOW (ref 12.0–15.0)

## 2019-05-09 MED ORDER — EPOETIN ALFA 40000 UNIT/ML IJ SOLN
40000.0000 [IU] | INTRAMUSCULAR | Status: DC
  Administered 2019-05-09: 40000 [IU] via SUBCUTANEOUS

## 2019-05-09 MED ORDER — EPOETIN ALFA 40000 UNIT/ML IJ SOLN
INTRAMUSCULAR | Status: AC
  Filled 2019-05-09: qty 1

## 2019-05-30 ENCOUNTER — Ambulatory Visit (HOSPITAL_COMMUNITY)
Admission: RE | Admit: 2019-05-30 | Discharge: 2019-05-30 | Disposition: A | Discharge status: Home / Self Care | Payer: Medicare Other | Source: Ambulatory Visit | Attending: Nephrology | Admitting: Nephrology

## 2019-05-30 ENCOUNTER — Other Ambulatory Visit: Payer: Self-pay

## 2019-05-30 VITALS — BP 142/59 | HR 73 | Temp 96.5°F | Resp 20

## 2019-05-30 DIAGNOSIS — N183 Chronic kidney disease, stage 3 unspecified: Secondary | ICD-10-CM | POA: Diagnosis not present

## 2019-05-30 DIAGNOSIS — D631 Anemia in chronic kidney disease: Secondary | ICD-10-CM | POA: Insufficient documentation

## 2019-05-30 DIAGNOSIS — Z7901 Long term (current) use of anticoagulants: Secondary | ICD-10-CM | POA: Insufficient documentation

## 2019-05-30 LAB — RENAL FUNCTION PANEL
Albumin: 3.9 g/dL (ref 3.5–5.0)
Anion gap: 10 (ref 5–15)
BUN: 61 mg/dL — ABNORMAL HIGH (ref 8–23)
CO2: 24 mmol/L (ref 22–32)
Calcium: 9.4 mg/dL (ref 8.9–10.3)
Chloride: 103 mmol/L (ref 98–111)
Creatinine, Ser: 2.25 mg/dL — ABNORMAL HIGH (ref 0.44–1.00)
GFR calc Af Amer: 24 mL/min — ABNORMAL LOW (ref >60)
GFR calc non Af Amer: 21 mL/min — ABNORMAL LOW (ref >60)
Glucose, Bld: 101 mg/dL — ABNORMAL HIGH (ref 70–99)
Phosphorus: 4.6 mg/dL (ref 2.5–4.6)
Potassium: 4.4 mmol/L (ref 3.5–5.1)
Sodium: 137 mmol/L (ref 135–145)

## 2019-05-30 LAB — IRON AND TIBC
Iron: 146 ug/dL (ref 28–170)
Saturation Ratios: 68 % — ABNORMAL HIGH (ref 10.4–31.8)
TIBC: 214 ug/dL — ABNORMAL LOW (ref 250–450)
UIBC: 68 ug/dL

## 2019-05-30 LAB — PROTIME-INR
INR: 3.7 — ABNORMAL HIGH (ref 0.8–1.2)
Prothrombin Time: 36.3 seconds — ABNORMAL HIGH (ref 11.4–15.2)

## 2019-05-30 LAB — POCT HEMOGLOBIN-HEMACUE: Hemoglobin: 11.1 g/dL — ABNORMAL LOW (ref 12.0–15.0)

## 2019-05-30 LAB — FERRITIN: Ferritin: 810 ng/mL — ABNORMAL HIGH (ref 11–307)

## 2019-05-30 MED ORDER — EPOETIN ALFA 40000 UNIT/ML IJ SOLN
INTRAMUSCULAR | Status: AC
  Filled 2019-05-30: qty 1

## 2019-05-30 MED ORDER — EPOETIN ALFA 10000 UNIT/ML IJ SOLN
40000.0000 [IU] | INTRAMUSCULAR | Status: DC
  Administered 2019-05-30: 40000 [IU] via SUBCUTANEOUS

## 2019-05-31 MED FILL — Epoetin Alfa Inj 40000 Unit/ML: INTRAMUSCULAR | Qty: 1 | Status: AC

## 2019-06-04 DIAGNOSIS — Z7901 Long term (current) use of anticoagulants: Secondary | ICD-10-CM | POA: Diagnosis not present

## 2019-06-13 ENCOUNTER — Ambulatory Visit: Payer: Medicare Other | Admitting: Cardiovascular Disease

## 2019-06-18 ENCOUNTER — Encounter (HOSPITAL_BASED_OUTPATIENT_CLINIC_OR_DEPARTMENT_OTHER): Payer: Self-pay | Admitting: *Deleted

## 2019-06-18 ENCOUNTER — Inpatient Hospital Stay (HOSPITAL_BASED_OUTPATIENT_CLINIC_OR_DEPARTMENT_OTHER)
Admission: EM | Admit: 2019-06-18 | Discharge: 2019-06-23 | DRG: 378 | Disposition: A | Discharge status: Home / Self Care | Payer: Medicare Other | Attending: Internal Medicine | Admitting: Internal Medicine

## 2019-06-18 ENCOUNTER — Ambulatory Visit: Payer: Medicare Other | Admitting: Family Medicine

## 2019-06-18 ENCOUNTER — Inpatient Hospital Stay (HOSPITAL_COMMUNITY): Payer: Medicare Other

## 2019-06-18 ENCOUNTER — Other Ambulatory Visit: Payer: Self-pay

## 2019-06-18 DIAGNOSIS — K922 Gastrointestinal hemorrhage, unspecified: Secondary | ICD-10-CM | POA: Diagnosis present

## 2019-06-18 DIAGNOSIS — I482 Chronic atrial fibrillation, unspecified: Secondary | ICD-10-CM | POA: Diagnosis present

## 2019-06-18 DIAGNOSIS — K5791 Diverticulosis of intestine, part unspecified, without perforation or abscess with bleeding: Principal | ICD-10-CM | POA: Diagnosis present

## 2019-06-18 DIAGNOSIS — Z8249 Family history of ischemic heart disease and other diseases of the circulatory system: Secondary | ICD-10-CM

## 2019-06-18 DIAGNOSIS — E039 Hypothyroidism, unspecified: Secondary | ICD-10-CM | POA: Diagnosis present

## 2019-06-18 DIAGNOSIS — R519 Headache, unspecified: Secondary | ICD-10-CM | POA: Diagnosis present

## 2019-06-18 DIAGNOSIS — K50911 Crohn's disease, unspecified, with rectal bleeding: Secondary | ICD-10-CM | POA: Diagnosis present

## 2019-06-18 DIAGNOSIS — R55 Syncope and collapse: Secondary | ICD-10-CM | POA: Diagnosis present

## 2019-06-18 DIAGNOSIS — D62 Acute posthemorrhagic anemia: Secondary | ICD-10-CM | POA: Diagnosis present

## 2019-06-18 DIAGNOSIS — R001 Bradycardia, unspecified: Secondary | ICD-10-CM | POA: Diagnosis present

## 2019-06-18 DIAGNOSIS — R578 Other shock: Secondary | ICD-10-CM | POA: Diagnosis not present

## 2019-06-18 DIAGNOSIS — M329 Systemic lupus erythematosus, unspecified: Secondary | ICD-10-CM | POA: Diagnosis present

## 2019-06-18 DIAGNOSIS — Z905 Acquired absence of kidney: Secondary | ICD-10-CM

## 2019-06-18 DIAGNOSIS — N184 Chronic kidney disease, stage 4 (severe): Secondary | ICD-10-CM | POA: Diagnosis present

## 2019-06-18 DIAGNOSIS — Z933 Colostomy status: Secondary | ICD-10-CM

## 2019-06-18 DIAGNOSIS — I129 Hypertensive chronic kidney disease with stage 1 through stage 4 chronic kidney disease, or unspecified chronic kidney disease: Secondary | ICD-10-CM | POA: Diagnosis present

## 2019-06-18 DIAGNOSIS — K921 Melena: Secondary | ICD-10-CM | POA: Diagnosis not present

## 2019-06-18 DIAGNOSIS — Z882 Allergy status to sulfonamides status: Secondary | ICD-10-CM

## 2019-06-18 DIAGNOSIS — Z87442 Personal history of urinary calculi: Secondary | ICD-10-CM

## 2019-06-18 DIAGNOSIS — Z86718 Personal history of other venous thrombosis and embolism: Secondary | ICD-10-CM

## 2019-06-18 DIAGNOSIS — Z9841 Cataract extraction status, right eye: Secondary | ICD-10-CM

## 2019-06-18 DIAGNOSIS — R0602 Shortness of breath: Secondary | ICD-10-CM | POA: Diagnosis not present

## 2019-06-18 DIAGNOSIS — F419 Anxiety disorder, unspecified: Secondary | ICD-10-CM | POA: Diagnosis present

## 2019-06-18 DIAGNOSIS — I480 Paroxysmal atrial fibrillation: Secondary | ICD-10-CM | POA: Diagnosis present

## 2019-06-18 DIAGNOSIS — E872 Acidosis: Secondary | ICD-10-CM | POA: Diagnosis present

## 2019-06-18 DIAGNOSIS — Z20822 Contact with and (suspected) exposure to covid-19: Secondary | ICD-10-CM | POA: Diagnosis present

## 2019-06-18 DIAGNOSIS — Z85528 Personal history of other malignant neoplasm of kidney: Secondary | ICD-10-CM

## 2019-06-18 DIAGNOSIS — G4733 Obstructive sleep apnea (adult) (pediatric): Secondary | ICD-10-CM | POA: Diagnosis present

## 2019-06-18 DIAGNOSIS — R791 Abnormal coagulation profile: Secondary | ICD-10-CM

## 2019-06-18 DIAGNOSIS — I959 Hypotension, unspecified: Secondary | ICD-10-CM | POA: Diagnosis present

## 2019-06-18 DIAGNOSIS — Z881 Allergy status to other antibiotic agents status: Secondary | ICD-10-CM

## 2019-06-18 DIAGNOSIS — Z9842 Cataract extraction status, left eye: Secondary | ICD-10-CM

## 2019-06-18 DIAGNOSIS — Z940 Kidney transplant status: Secondary | ICD-10-CM

## 2019-06-18 DIAGNOSIS — Z880 Allergy status to penicillin: Secondary | ICD-10-CM

## 2019-06-18 DIAGNOSIS — I452 Bifascicular block: Secondary | ICD-10-CM | POA: Diagnosis present

## 2019-06-18 DIAGNOSIS — R000 Tachycardia, unspecified: Secondary | ICD-10-CM | POA: Diagnosis not present

## 2019-06-18 DIAGNOSIS — Z531 Procedure and treatment not carried out because of patient's decision for reasons of belief and group pressure: Secondary | ICD-10-CM | POA: Diagnosis present

## 2019-06-18 DIAGNOSIS — G4700 Insomnia, unspecified: Secondary | ICD-10-CM | POA: Diagnosis present

## 2019-06-18 DIAGNOSIS — R609 Edema, unspecified: Secondary | ICD-10-CM | POA: Diagnosis not present

## 2019-06-18 DIAGNOSIS — E875 Hyperkalemia: Secondary | ICD-10-CM | POA: Diagnosis present

## 2019-06-18 DIAGNOSIS — T45515A Adverse effect of anticoagulants, initial encounter: Secondary | ICD-10-CM | POA: Diagnosis present

## 2019-06-18 DIAGNOSIS — D6832 Hemorrhagic disorder due to extrinsic circulating anticoagulants: Secondary | ICD-10-CM | POA: Diagnosis present

## 2019-06-18 DIAGNOSIS — Z9049 Acquired absence of other specified parts of digestive tract: Secondary | ICD-10-CM

## 2019-06-18 DIAGNOSIS — Z961 Presence of intraocular lens: Secondary | ICD-10-CM | POA: Diagnosis present

## 2019-06-18 DIAGNOSIS — Z8711 Personal history of peptic ulcer disease: Secondary | ICD-10-CM

## 2019-06-18 DIAGNOSIS — Z79890 Hormone replacement therapy (postmenopausal): Secondary | ICD-10-CM

## 2019-06-18 DIAGNOSIS — R0600 Dyspnea, unspecified: Secondary | ICD-10-CM

## 2019-06-18 DIAGNOSIS — Z7901 Long term (current) use of anticoagulants: Secondary | ICD-10-CM

## 2019-06-18 DIAGNOSIS — Z79899 Other long term (current) drug therapy: Secondary | ICD-10-CM

## 2019-06-18 LAB — COMPREHENSIVE METABOLIC PANEL
ALT: 12 U/L (ref 0–44)
ALT: 11 U/L (ref 0–44)
AST: 18 U/L (ref 15–41)
AST: 16 U/L (ref 15–41)
Albumin: 4 g/dL (ref 3.5–5.0)
Albumin: 2.9 g/dL — ABNORMAL LOW (ref 3.5–5.0)
Alkaline Phosphatase: 122 U/L (ref 38–126)
Alkaline Phosphatase: 89 U/L (ref 38–126)
Anion gap: 11 (ref 5–15)
Anion gap: 9 (ref 5–15)
BUN: 54 mg/dL — ABNORMAL HIGH (ref 8–23)
BUN: 55 mg/dL — ABNORMAL HIGH (ref 8–23)
CO2: 23 mmol/L (ref 22–32)
CO2: 22 mmol/L (ref 22–32)
Calcium: 9.3 mg/dL (ref 8.9–10.3)
Calcium: 8.2 mg/dL — ABNORMAL LOW (ref 8.9–10.3)
Chloride: 102 mmol/L (ref 98–111)
Chloride: 107 mmol/L (ref 98–111)
Creatinine, Ser: 2.16 mg/dL — ABNORMAL HIGH (ref 0.44–1.00)
Creatinine, Ser: 2.43 mg/dL — ABNORMAL HIGH (ref 0.44–1.00)
GFR calc Af Amer: 25 mL/min — ABNORMAL LOW (ref >60)
GFR calc Af Amer: 22 mL/min — ABNORMAL LOW (ref >60)
GFR calc non Af Amer: 22 mL/min — ABNORMAL LOW (ref >60)
GFR calc non Af Amer: 19 mL/min — ABNORMAL LOW (ref >60)
Glucose, Bld: 138 mg/dL — ABNORMAL HIGH (ref 70–99)
Glucose, Bld: 160 mg/dL — ABNORMAL HIGH (ref 70–99)
Potassium: 4.1 mmol/L (ref 3.5–5.1)
Potassium: 4.2 mmol/L (ref 3.5–5.1)
Sodium: 136 mmol/L (ref 135–145)
Sodium: 138 mmol/L (ref 135–145)
Total Bilirubin: 0.7 mg/dL (ref 0.3–1.2)
Total Bilirubin: 0.4 mg/dL (ref 0.3–1.2)
Total Protein: 7 g/dL (ref 6.5–8.1)
Total Protein: 5.3 g/dL — ABNORMAL LOW (ref 6.5–8.1)

## 2019-06-18 LAB — CBC WITH DIFFERENTIAL/PLATELET
Abs Immature Granulocytes: 0.04 10*3/uL (ref 0.00–0.07)
Abs Immature Granulocytes: 0.16 10*3/uL — ABNORMAL HIGH (ref 0.00–0.07)
Abs Immature Granulocytes: 0.13 10*3/uL — ABNORMAL HIGH (ref 0.00–0.07)
Basophils Absolute: 0.1 10*3/uL (ref 0.0–0.1)
Basophils Absolute: 0.1 10*3/uL (ref 0.0–0.1)
Basophils Absolute: 0.1 10*3/uL (ref 0.0–0.1)
Basophils Relative: 0 %
Basophils Relative: 0 %
Basophils Relative: 0 %
Eosinophils Absolute: 0.4 10*3/uL (ref 0.0–0.5)
Eosinophils Absolute: 0.5 10*3/uL (ref 0.0–0.5)
Eosinophils Absolute: 0.3 10*3/uL (ref 0.0–0.5)
Eosinophils Relative: 3 %
Eosinophils Relative: 2 %
Eosinophils Relative: 2 %
HCT: 38 % (ref 36.0–46.0)
HCT: 29.8 % — ABNORMAL LOW (ref 36.0–46.0)
HCT: 27 % — ABNORMAL LOW (ref 36.0–46.0)
Hemoglobin: 11.4 g/dL — ABNORMAL LOW (ref 12.0–15.0)
Hemoglobin: 9 g/dL — ABNORMAL LOW (ref 12.0–15.0)
Hemoglobin: 7.9 g/dL — ABNORMAL LOW (ref 12.0–15.0)
Immature Granulocytes: 0 %
Immature Granulocytes: 1 %
Immature Granulocytes: 1 %
Lymphocytes Relative: 20 %
Lymphocytes Relative: 12 %
Lymphocytes Relative: 16 %
Lymphs Abs: 2.4 10*3/uL (ref 0.7–4.0)
Lymphs Abs: 2.5 10*3/uL (ref 0.7–4.0)
Lymphs Abs: 3 10*3/uL (ref 0.7–4.0)
MCH: 28.7 pg (ref 26.0–34.0)
MCH: 28.7 pg (ref 26.0–34.0)
MCH: 28.6 pg (ref 26.0–34.0)
MCHC: 30 g/dL (ref 30.0–36.0)
MCHC: 30.2 g/dL (ref 30.0–36.0)
MCHC: 29.3 g/dL — ABNORMAL LOW (ref 30.0–36.0)
MCV: 95.7 fL (ref 80.0–100.0)
MCV: 94.9 fL (ref 80.0–100.0)
MCV: 97.8 fL (ref 80.0–100.0)
Monocytes Absolute: 1.3 10*3/uL — ABNORMAL HIGH (ref 0.1–1.0)
Monocytes Absolute: 1.8 10*3/uL — ABNORMAL HIGH (ref 0.1–1.0)
Monocytes Absolute: 2.5 10*3/uL — ABNORMAL HIGH (ref 0.1–1.0)
Monocytes Relative: 11 %
Monocytes Relative: 8 %
Monocytes Relative: 13 %
Neutro Abs: 8.2 10*3/uL — ABNORMAL HIGH (ref 1.7–7.7)
Neutro Abs: 16.4 10*3/uL — ABNORMAL HIGH (ref 1.7–7.7)
Neutro Abs: 12.4 10*3/uL — ABNORMAL HIGH (ref 1.7–7.7)
Neutrophils Relative %: 66 %
Neutrophils Relative %: 77 %
Neutrophils Relative %: 68 %
Platelets: 185 10*3/uL (ref 150–400)
Platelets: 125 10*3/uL — ABNORMAL LOW (ref 150–400)
Platelets: 149 10*3/uL — ABNORMAL LOW (ref 150–400)
RBC: 3.97 MIL/uL (ref 3.87–5.11)
RBC: 3.14 MIL/uL — ABNORMAL LOW (ref 3.87–5.11)
RBC: 2.76 MIL/uL — ABNORMAL LOW (ref 3.87–5.11)
RDW: 16.5 % — ABNORMAL HIGH (ref 11.5–15.5)
RDW: 16.6 % — ABNORMAL HIGH (ref 11.5–15.5)
RDW: 16.5 % — ABNORMAL HIGH (ref 11.5–15.5)
WBC: 12.5 10*3/uL — ABNORMAL HIGH (ref 4.0–10.5)
WBC: 21.3 10*3/uL — ABNORMAL HIGH (ref 4.0–10.5)
WBC: 18.3 10*3/uL — ABNORMAL HIGH (ref 4.0–10.5)
nRBC: 0 % (ref 0.0–0.2)
nRBC: 0 % (ref 0.0–0.2)
nRBC: 0 % (ref 0.0–0.2)

## 2019-06-18 LAB — RESPIRATORY PANEL BY RT PCR (FLU A&B, COVID)
Influenza A by PCR: NEGATIVE
Influenza B by PCR: NEGATIVE
SARS Coronavirus 2 by RT PCR: NEGATIVE

## 2019-06-18 LAB — MRSA PCR SCREENING: MRSA by PCR: NEGATIVE

## 2019-06-18 LAB — HEMOGLOBIN AND HEMATOCRIT, BLOOD
HCT: 26.9 % — ABNORMAL LOW (ref 36.0–46.0)
Hemoglobin: 7.9 g/dL — ABNORMAL LOW (ref 12.0–15.0)

## 2019-06-18 LAB — PROTIME-INR
INR: 4.3 (ref 0.8–1.2)
INR: 3.1 — ABNORMAL HIGH (ref 0.8–1.2)
Prothrombin Time: 41.6 seconds — ABNORMAL HIGH (ref 11.4–15.2)
Prothrombin Time: 31.8 seconds — ABNORMAL HIGH (ref 11.4–15.2)

## 2019-06-18 LAB — APTT: aPTT: 66 seconds — ABNORMAL HIGH (ref 24–36)

## 2019-06-18 MED ORDER — SODIUM CHLORIDE 0.9 % IV BOLUS
1000.0000 mL | Freq: Once | INTRAVENOUS | Status: AC
  Administered 2019-06-18: 1000 mL via INTRAVENOUS

## 2019-06-18 MED ORDER — HYDROCORTISONE NA SUCCINATE PF 100 MG IJ SOLR
100.0000 mg | Freq: Two times a day (BID) | INTRAMUSCULAR | Status: DC
  Administered 2019-06-18 – 2019-06-20 (×4): 100 mg via INTRAVENOUS
  Filled 2019-06-18 (×4): qty 2

## 2019-06-18 MED ORDER — ORAL CARE MOUTH RINSE
15.0000 mL | Freq: Two times a day (BID) | OROMUCOSAL | Status: DC
  Administered 2019-06-19 – 2019-06-23 (×9): 15 mL via OROMUCOSAL

## 2019-06-18 MED ORDER — ATROPINE SULFATE 1 MG/ML IJ SOLN
1.0000 mg | Freq: Once | INTRAMUSCULAR | Status: AC
  Administered 2019-06-18: 1 mg via INTRAVENOUS
  Filled 2019-06-18: qty 1

## 2019-06-18 MED ORDER — ACETAMINOPHEN 10 MG/ML IV SOLN
1000.0000 mg | Freq: Once | INTRAVENOUS | Status: AC
  Administered 2019-06-18: 1000 mg via INTRAVENOUS
  Filled 2019-06-18: qty 100

## 2019-06-18 MED ORDER — EPOETIN ALFA 40000 UNIT/ML IJ SOLN
30000.0000 [IU] | Freq: Once | INTRAMUSCULAR | Status: DC
  Filled 2019-06-18: qty 1

## 2019-06-18 MED ORDER — PANTOPRAZOLE SODIUM 40 MG IV SOLR
40.0000 mg | Freq: Two times a day (BID) | INTRAVENOUS | Status: DC
  Administered 2019-06-18 – 2019-06-20 (×5): 40 mg via INTRAVENOUS
  Filled 2019-06-18 (×5): qty 40

## 2019-06-18 MED ORDER — TECHNETIUM TC 99M-LABELED RED BLOOD CELLS IV KIT
21.1000 | PACK | Freq: Once | INTRAVENOUS | Status: AC | PRN
  Administered 2019-06-18: 21.1 via INTRAVENOUS

## 2019-06-18 MED ORDER — TRANEXAMIC ACID-NACL 1000-0.7 MG/100ML-% IV SOLN
1000.0000 mg | Freq: Once | INTRAVENOUS | Status: AC
  Administered 2019-06-18: 1000 mg via INTRAVENOUS
  Filled 2019-06-18: qty 100

## 2019-06-18 MED ORDER — ATROPINE SULFATE 1 MG/ML IJ SOLN
0.4000 mg | Freq: Once | INTRAMUSCULAR | Status: DC | PRN
  Filled 2019-06-18: qty 0.4

## 2019-06-18 MED ORDER — ONDANSETRON HCL 4 MG/2ML IJ SOLN
4.0000 mg | Freq: Four times a day (QID) | INTRAMUSCULAR | Status: DC | PRN
  Administered 2019-06-19 – 2019-06-21 (×2): 4 mg via INTRAVENOUS
  Filled 2019-06-18 (×2): qty 2

## 2019-06-18 MED ORDER — SODIUM CHLORIDE 0.9 % IV SOLN
INTRAVENOUS | Status: DC | PRN
  Administered 2019-06-18: 1000 mL via INTRAVENOUS

## 2019-06-18 MED ORDER — CHLORHEXIDINE GLUCONATE CLOTH 2 % EX PADS
6.0000 | MEDICATED_PAD | Freq: Every day | CUTANEOUS | Status: DC
  Administered 2019-06-19 – 2019-06-22 (×3): 6 via TOPICAL

## 2019-06-18 MED ORDER — ONDANSETRON HCL 4 MG/2ML IJ SOLN
4.0000 mg | Freq: Once | INTRAMUSCULAR | Status: AC
  Administered 2019-06-18: 4 mg via INTRAVENOUS
  Filled 2019-06-18: qty 2

## 2019-06-18 MED ORDER — VITAMIN K1 10 MG/ML IJ SOLN
5.0000 mg | Freq: Once | INTRAVENOUS | Status: AC
  Administered 2019-06-18: 5 mg via INTRAVENOUS
  Filled 2019-06-18: qty 0.5

## 2019-06-18 MED ORDER — PANTOPRAZOLE SODIUM 40 MG IV SOLR
40.0000 mg | Freq: Once | INTRAVENOUS | Status: AC
  Administered 2019-06-18: 40 mg via INTRAVENOUS
  Filled 2019-06-18: qty 40

## 2019-06-18 MED ORDER — LEVOFLOXACIN IN D5W 500 MG/100ML IV SOLN
500.0000 mg | INTRAVENOUS | Status: DC
  Administered 2019-06-20: 500 mg via INTRAVENOUS
  Filled 2019-06-18: qty 100

## 2019-06-18 MED ORDER — ATROPINE SULFATE 1 MG/ML IJ SOLN
INTRAMUSCULAR | Status: AC
  Filled 2019-06-18: qty 1

## 2019-06-18 MED ORDER — ONDANSETRON HCL 4 MG/2ML IJ SOLN
INTRAMUSCULAR | Status: AC
  Filled 2019-06-18: qty 2

## 2019-06-18 MED ORDER — ONDANSETRON HCL 4 MG/2ML IJ SOLN
4.0000 mg | Freq: Once | INTRAMUSCULAR | Status: AC
  Administered 2019-06-18: 4 mg via INTRAVENOUS

## 2019-06-18 MED ORDER — LEVOFLOXACIN IN D5W 750 MG/150ML IV SOLN
750.0000 mg | INTRAVENOUS | Status: AC
  Administered 2019-06-18: 750 mg via INTRAVENOUS
  Filled 2019-06-18: qty 150

## 2019-06-18 MED ORDER — DEXTROSE-NACL 5-0.9 % IV SOLN: INTRAVENOUS | Status: DC

## 2019-06-18 MED ORDER — METRONIDAZOLE IN NACL 5-0.79 MG/ML-% IV SOLN
500.0000 mg | Freq: Three times a day (TID) | INTRAVENOUS | Status: DC
  Administered 2019-06-18 – 2019-06-21 (×8): 500 mg via INTRAVENOUS
  Filled 2019-06-18 (×8): qty 100

## 2019-06-18 MED ORDER — FENTANYL CITRATE (PF) 100 MCG/2ML IJ SOLN
50.0000 ug | Freq: Once | INTRAMUSCULAR | Status: AC
  Administered 2019-06-18: 50 ug via INTRAVENOUS

## 2019-06-18 NOTE — ED Triage Notes (Signed)
Abdominal pain and bright red rectal bleeding since yesterday. Hx of diverticulitis and GI bleed.

## 2019-06-18 NOTE — ED Provider Notes (Signed)
Patient transferred from [facility].  History of DVT, A. fib, on Coumadin with renal transplant.  Jehovah's Witness.  Sent from [facility] with rectal bleeding since yesterday.  She denies pain.  She has had several episodes of bradycardia with losing consciousness.  On arrival patient appears ill.  She has bradycardia into the 20s and is given a dose of atropine.  Blood pressure is 147 systolic.  She denies chest pain or abdominal pain.  Patient confirms that she does not want any blood products. Patient did receive vitamin K and Protonix prior to transfer.  Patient did require atropine on arrival for bradycardia into the 20s.  Blood pressure 092 systolic.  Discussed with ICU Dr. Melvyn Novas.  Discussed with pharmacy.  Both Feiba and [place] are made from human plasma and patient would not accept this. She is agreeable to TXA.  Discussed with patient that she does not want to receive any blood products even with this result in her death.  D/w Dr. Alessandra Bevels of [facility].  He states that patient is not stable enough for bleeding scan but would recommend CT angiogram.  Patient has a renal transplant and her GFR is 22.  It is unlikely she can tolerate IV contrast.  He recommends reversal of her INR and stabilization.  Does recommend bleeding scan when she is more stable.  D/w Dr. Elsworth Soho of critical care and Dr. Cristina Gong of GI at bedside.   BP remains in low 100s and seems to be improving.  Plan for bleeding scan in nuclear medicine and ICU admission.   CRITICAL CARE Performed by: Ezequiel Essex Total critical care time: 53mnutes Critical care time was exclusive of separately billable procedures and treating other patients. Critical care was necessary to treat or prevent imminent or life-threatening deterioration. Critical care was time spent personally by me on the following activities: development of treatment plan with patient and/or surrogate as well as nursing, discussions with consultants,  evaluation of patient's response to treatment, examination of patient, obtaining history from patient or surrogate, ordering and performing treatments and interventions, ordering and review of laboratory studies, ordering and review of radiographic studies, pulse oximetry and re-evaluation of patient's condition.    EKG Interpretation  Date/Time:  Tuesday June 18 2019 17:00:44 EST Ventricular Rate:  109 PR Interval:    QRS Duration: 124 QT Interval:  367 QTC Calculation: 495 R Axis:   101 Text Interpretation: Sinus tachycardia RBBB and LPFB Rate faster Confirmed by REzequiel Essex([phone] on 06/18/2019 5:14:09 PM          Thomos Domine, SAnnie Main MD 06/19/19 0210

## 2019-06-18 NOTE — ED Notes (Signed)
Pts husband requested update on patient. Husband informed per consent from pt that we are waiting on test results.

## 2019-06-18 NOTE — ED Notes (Signed)
PA at bedside, pt had a syncopal episode while changing position. MD aware and  at bedside , Atropine ordered and stand by, pt regained consciousness and BP WDL .  GI consult for stepdown bed .

## 2019-06-18 NOTE — ED Notes (Signed)
Pt's spouse called back and made aware about pt's transfer to [facility] ED .

## 2019-06-18 NOTE — ED Notes (Signed)
Nuclear med called with questions for pt to answer. Upon speaking with pt Probation officer was accompanied by Bradley Ferris RN, pt understands she will have blood obtained for a tracer and will be administered back to her, clarified it will be her own blood. She will also have to remain flat for 3 hours for test. She has verbalized agreeing to plan. She is aware it will be a couple of hours before test will be started.

## 2019-06-18 NOTE — Progress Notes (Signed)
RN had pt sign a blood refusal form and it was placed in pt's chart for MD to sign.

## 2019-06-18 NOTE — ED Provider Notes (Addendum)
[facility]Y DEPARTMENT Provider Note   CSN: [id number] Arrival date & time: 06/18/19  1124     History Chief Complaint  Patient presents with  . Rectal Bleeding    [name] is a 79 y.o. female with a past medical history of DVT, A. 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  Since yesterday has had several episodes of bright red bleeding per rectum.  She states that this feels similar to her prior GI bleed in 2019.  She receives Procrit monthly and is scheduled for 1 tomorrow.  She last took Coumadin yesterday.  Reports vague cramping rectal pain secondary to the bleeding.  Reports some lightheadedness since the bleeding started. She denies any vomiting or sick contacts with similar symptoms.  She is followed by [facility].  Denies urinary symptoms, shortness of breath, chest pain. She is not a dialysis patient.  HPI     Past Medical History:  Diagnosis Date  . Abdominal wall hernia   . Anemia    "when lupus flares up"  . Anxiety   . Arthritis    "in my joints"  . Atrial fibrillation ([place]   . CAP (community acquired pneumonia) 08/20/2014  . Crohn's disease ([place]   . DVT of leg (deep venous thrombosis) ([place] 2012-8./13/2013   "have had one in each leg now"  . DVT of lower extremity, bilateral ([place]    "have had them in both legs; last one was on the RLE this year" (04/25/2012)  . GERD (gastroesophageal reflux disease)   . Gout 2016  . History of hiatal hernia   . History of kidney stones   . Hypertension   . Hypothyroidism   . Lower GI bleed 2012  . Numbness and tingling of foot    bilaterally  . OSA on CPAP   . Refusal of blood transfusion for reasons of conscience 12/20/2011   pt is Jehovah's Witness  . Renal cell carcinoma   . Renal disorder    S/P nephrectomy; dialysis; "working  fine now" (12/20/11)  . SBO (small bowel obstruction) ([place] 11/2016  . SLE (systemic lupus erythematosus) ([place]     Patient Active Problem List   Diagnosis Date Noted  . Acute lower GI bleeding 06/18/2019  . GI bleed 02/10/2018  . Lower GI bleed 02/10/2018  . Left knee pain 08/01/2017  . Right shoulder injury, subsequent encounter 07/10/2017  . Right shoulder pain 06/22/2017  . Hyperlipidemia LDL goal <100 06/06/2017  . Chronic pain of right knee 06/06/2017  . Plantar fasciitis 06/06/2017  . Partial small bowel obstruction (HH[place] 11/23/2016  . SBO (small bowel obstruction) (HB[place] 08/06/2016  . Community acquired pneumonia   . Pleural effusion, left   . Streptococcal pneumonia ([place] 08/20/2014  . GERD (gastroesophageal reflux disease) 08/20/2014  . SLE (systemic lupus erythematosus) ([place] 08/20/2014  . OSA (obstructive sleep apnea) 08/20/2014  . Immunosuppressed status ([place] 08/20/2014  . Sepsis ([place] 08/20/2014  . Anemia of chronic disease 08/20/2014  . Syncope 08/20/2014  . History of DVT (deep vein thrombosis) 03/05/2014  . Allergic rhinitis 09/08/2013  . Aortic heart murmur 09/04/2013  . Crohn's disease (HH[place]   . Renal disorder   . Atrial fibrillation ([place] 12/20/2011  . CKD (chronic kidney disease) stage 3, GFR 30-59 ml/min (HCC) 12/20/2011  . History of renal transplant 12/20/2011  . HTN (  hypertension) 12/20/2011  . Hypothyroidism 12/20/2011    Past Surgical History:  Procedure Laterality Date  . [place]   "found sluggish bone marrow" (04/25/2012)  . BONE MARROW BIOPSY  1993; 1995  . BREAST EXCISIONAL BIOPSY     right  . CATARACT EXTRACTION W/ INTRAOCULAR LENS  IMPLANT, BILATERAL  2005-2010  . CESAREAN SECTION  1984  . CHOLECYSTECTOMY  1995  . COLECTOMY  1998   "removal of abscess" (04/25/2012)  . COLONOSCOPY N/A 08/30/2013   Procedure: COLONOSCOPY;  Surgeon: Winfield Cunas., MD;  Location: [facility];  Service: Endoscopy;   Laterality: N/A;  . COLONOSCOPY WITH PROPOFOL N/A 09/06/2017   Procedure: COLONOSCOPY WITH PROPOFOL;  Surgeon: Laurence Spates, MD;  Location: [facility];  Service: Endoscopy;  Laterality: N/A;  . COLOSTOMY  1998  . COLOSTOMY REVERSAL  1999   "6 months after placed" (04/25/2012)  . EXCISIONAL HEMORRHOIDECTOMY    . INSERTION OF DIALYSIS CATHETER  1988-2006   "peritoneal and hemodialysis; had multiple grafts and fistulas; last graft clotted off 2012"  . NEPHRECTOMY TRANSPLANTED ORGAN  2006   bilaterally  . PARATHYROID IMPLANT REMOVAL Left    removed parathyroid from neck and implanted in arm  . VENA CAVA FILTER PLACEMENT  2012     OB History   No obstetric history on file.     Family History  Problem Relation Age of Onset  . Heart disease Mother   . Hypertension Other        runs in family  . Sleep apnea Sister     Social History   Tobacco Use  . Smoking status: Never Smoker  . Smokeless tobacco: Never Used  Substance Use Topics  . Alcohol use: No  . Drug use: No    Home Medications Prior to Admission medications   Medication Sig Start Date End Date Taking? Authorizing Provider  acetaminophen (TYLENOL) 325 MG tablet Take 650 mg by mouth every 6 (six) hours as needed for mild pain.    [provider]  allopurinol (ZYLOPRIM) 100 MG tablet Take 1 tablet (100 mg total) by mouth 2 (two) times daily. 07/18/16   Ann Held, DO  calcitRIOL (ROCALTROL) 0.25 MCG capsule Take 0.25-0.5 mcg by mouth See admin instructions. Take 0.5 mcg by mouth daily on Monday, Wednesday, Friday and Take 0.25 mcg by mouth daily on all other days    [provider]  cefdinir (OMNICEF) 300 MG capsule Take 1 capsule (300 mg total) by mouth 2 (two) times daily. 06/07/18   Ann Held, DO  epoetin alfa (EPOGEN,PROCRIT) 37902 UNIT/ML injection Inject 40,000 Units into the skin every 21 ( twenty-one) days.     [provider]  felodipine (PLENDIL) 10 MG 24 hr  tablet Take 10 mg by mouth daily.    [provider]  furosemide (LASIX) 40 MG tablet Take 40 mg by mouth 2 (two) times daily.     [provider]  gabapentin (NEURONTIN) 100 MG capsule Take 2 capsules (200 mg total) by mouth at bedtime. 07/18/16   Ann Held, DO  levothyroxine (SYNTHROID, LEVOTHROID) 100 MCG tablet Take 1 tablet (100 mcg total) by mouth daily. 07/18/16   Carollee Herter, Yvonne R, DO  MAGNESIUM-OXIDE 400 (241.3 MG) MG tablet Take 400 mg by mouth 2 (two) times daily. 05/19/14   [provider]  Melatonin 5 MG CAPS Take 5 mg by mouth daily.     [provider]  metoprolol tartrate (LOPRESSOR) 50 MG tablet Take 50 mg by mouth 2 (two) times daily.    [provider]  mycophenolate (MYFORTIC) 180 MG EC tablet Take 360 mg by mouth 2 (two) times daily.      [provider]  omeprazole (PRILOSEC) 20 MG capsule Take 20 mg by mouth daily. 07/20/16   [provider]  polyethylene glycol (MIRALAX / GLYCOLAX) packet Take 17 g by mouth daily as needed for moderate constipation.     [provider]  Polyethylene Glycol 400 (BLINK TEARS) 0.25 % GEL Place 1 drop into both eyes 2 (two) times daily as needed (for dry eyes).    [provider]  predniSONE (DELTASONE) 5 MG tablet Take 5 mg by mouth daily.     [provider]  Probiotic Product (PROBIOTIC PO) Take 2 tablets by mouth daily.    [provider]  tacrolimus (PROGRAF) 1 MG capsule Take 3 mg by mouth 2 (two) times daily.     [provider]  warfarin (COUMADIN) 2.5 MG tablet Take 0.5-1 tablets (1.25-2.5 mg total) by mouth See admin instructions. Take 2.5 mg by mouth daily on Monday, Wednesday, Friday and Take 1.25 mg by mouth daily on all other days 02/13/18   Jonetta Osgood, MD    Allergies    Amoxicillin, Ampicillin, Ciprofloxacin, Erythromycin, Penicillins, Tetracycline, Labetalol, Sulfa antibiotics, and  Sulfamethoxazole-trimethoprim  Review of Systems   Review of Systems  Constitutional: Negative for appetite change, chills and fever.  HENT: Negative for ear pain, rhinorrhea, sneezing and sore throat.   Eyes: Negative for photophobia and visual disturbance.  Respiratory: Negative for cough, chest tightness, shortness of breath and wheezing.   Cardiovascular: Negative for chest pain and palpitations.  Gastrointestinal: Positive for blood in stool. Negative for abdominal pain, constipation, diarrhea, nausea and vomiting.  Genitourinary: Negative for dysuria, hematuria and urgency.  Musculoskeletal: Negative for myalgias.  Skin: Negative for rash.  Neurological: Positive for light-headedness. Negative for dizziness and weakness.    Physical Exam Updated Vital Signs BP 138/71   Pulse 78   Temp 98.8 F (37.1 C) (Oral)   Resp 18   Ht 5' 3"  (1.6 m)   Wt 62.1 kg   LMP 03/05/2014   SpO2 100%   BMI 24.27 kg/m   Physical Exam Vitals and nursing note reviewed. Exam conducted with a chaperone present.  Constitutional:      General: She is not in acute distress.    Appearance: She is well-developed.  HENT:     Head: Normocephalic and atraumatic.     Nose: Nose normal.  Eyes:     General: No scleral icterus.       Right eye: No discharge.        Left eye: No discharge.     Conjunctiva/sclera: Conjunctivae normal.  Cardiovascular:     Rate and Rhythm: Normal rate and regular rhythm.     Heart sounds: Normal heart sounds. No murmur. No friction rub. No gallop.   Pulmonary:     Effort: Pulmonary effort is normal. No respiratory distress.     Breath sounds: Normal breath sounds.  Abdominal:     General: Bowel sounds are normal. There is no distension.     Palpations: Abdomen is soft.     Tenderness: There is no abdominal tenderness. There is no guarding.  Genitourinary:    Comments: Bright red clots being passed from rectum. Scarring around rectum with inability to complete  DRE. Musculoskeletal:  General: Normal range of motion.     Cervical back: Normal range of motion and neck supple.  Skin:    General: Skin is warm and dry.     Findings: No rash.  Neurological:     Mental Status: She is alert.     Motor: No abnormal muscle tone.     Coordination: Coordination normal.     ED Results / Procedures / Treatments   Labs (all labs ordered are listed, but only abnormal results are displayed) Labs Reviewed  COMPREHENSIVE METABOLIC PANEL - Abnormal; Notable for the following components:      Result Value   Glucose, Bld 138 (*)    BUN 54 (*)    Creatinine, Ser 2.16 (*)    GFR calc non Af Amer 22 (*)    GFR calc Af Amer 25 (*)    All other components within normal limits  CBC WITH DIFFERENTIAL/PLATELET - Abnormal; Notable for the following components:   WBC 12.5 (*)    Hemoglobin 11.4 (*)    RDW 16.5 (*)    Neutro Abs 8.2 (*)    Monocytes Absolute 1.3 (*)    All other components within normal limits  PROTIME-INR - Abnormal; Notable for the following components:   Prothrombin Time 41.6 (*)    INR 4.3 (*)    All other components within normal limits  APTT - Abnormal; Notable for the following components:   aPTT 66 (*)    All other components within normal limits  CBC WITH DIFFERENTIAL/PLATELET - Abnormal; Notable for the following components:   WBC 21.3 (*)    RBC 3.14 (*)    Hemoglobin 9.0 (*)    HCT 29.8 (*)    RDW 16.6 (*)    Platelets 125 (*)    Neutro Abs 16.4 (*)    Monocytes Absolute 1.8 (*)    Abs Immature Granulocytes 0.16 (*)    All other components within normal limits  RESPIRATORY PANEL BY RT PCR (FLU A&B, COVID)    EKG None  Radiology No results found.  Procedures .Critical Care Performed by: Delia Heady, PA-C Authorized by: Delia Heady, PA-C   Critical care provider statement:    Critical care time (minutes):  35   Critical care was necessary to treat or prevent imminent or life-threatening deterioration of  the following conditions:  Cardiac failure, circulatory failure, CNS failure or compromise, shock and renal failure   Critical care was time spent personally by me on the following activities:  Development of treatment plan with patient or surrogate, discussions with consultants, evaluation of patient's response to treatment, examination of patient, obtaining history from patient or surrogate, ordering and performing treatments and interventions, ordering and review of laboratory studies, ordering and review of radiographic studies, pulse oximetry, re-evaluation of patient's condition and review of old charts   I assumed direction of critical care for this patient from another provider in my specialty: no     (including critical care time)  Medications Ordered in ED Medications  atropine 1 MG/ML injection (has no administration in time range)  pantoprazole (PROTONIX) injection 40 mg (40 mg Intravenous Given 06/18/19 1313)  phytonadione (VITAMIN K) 5 mg in dextrose 5 % 50 mL IVPB (0 mg Intravenous Stopped 06/18/19 1438)  sodium chloride 0.9 % bolus 1,000 mL (1,000 mLs Intravenous New Bag/Given 06/18/19 1514)  ondansetron (ZOFRAN) injection 4 mg (4 mg Intravenous Given 06/18/19 1510)    ED Course  I have reviewed the triage vital signs and  the nursing notes.  Pertinent labs & imaging results that were available during my care of the patient were reviewed by me and considered in my medical decision making (see chart for details).  Clinical Course as of Jun 17 1530  Tue Jun 18, 2019  1301 INR(!!): 4.3 [HK]  1301 Hemoglobin(!): 11.4 [HK]  1313 Vitamin K 75m IV x1 here per pharmacy recommendations.   [HK]  16I had a conversation with Dr. BCristina Gongof [facility].  He was able to review the patient's charts including her prior colonoscopies, prior hemoglobins and management of this patient in the clinic and in the hospital.  He recommends treatment for anticoagulant reversal and GI bleed at this time.   Unsure if patient is a good candidate for repeat scoping.  I will admit to hospitalist service and GI will see the patient in consult.   [HK]    Clinical Course User Index [HK] KDelia Heady PA-C   MDM Rules/Calculators/A&P                      WHILDEGARDE DUNAWAYwas evaluated in Emergency Department on 06/18/19 for the symptoms described in the history of present illness. He/she was evaluated in the context of the global COVID-19 pandemic, which necessitated consideration that the patient might be at risk for infection with the SARS-CoV-2 virus that causes COVID-19. Institutional protocols and algorithms that pertain to the evaluation of patients at risk for COVID-19 are in a state of rapid change based on information released by regulatory bodies including the CDC and federal and state organizations. These policies and algorithms were followed during the patient's care in the ED.  79year old female with a history of DVT, A. fib currently anticoagulated on Coumadin, prior diverticular bleed, status post renal transplant 136yrago, presenting with bright red blood per rectum since yesterday.  She had a similar presentation in 2019 when she was diagnosed with a diverticular bleed.  She has an extensive GI history including Crohn's disease and several abdominal surgeries as well as difficulty with scoping due to scar tissue and a large ventral hernia.  She sees [facility].  She last took her Coumadin yesterday.  Work-up here significant for INR of 4.3 and hemoglobin of 11.7.  She is hemodynamically stable.  Unable to perform a digital rectal exam on her although there is a large amount of clots being passed here in the ER.  Patient given IV vitamin K for reversal of her Coumadin.  I have spoke to hospitalist and given them GIs recommendations who I also spoke to.  I appreciate both of these consults for management of this patient. Patient is agreeable to admission.  3:31 PM On reevaluation patient became  bradycardic and hypotensive.  States that she felt she was going to pass out.  Pulse went down to 30s.  She continues to pass large clots after receiving vitamin K and Protonix.  Thankfully she improved with repositioning and IV fluids, she did not have to receive atropine.  I reconsulted Dr. BuCristina Gongho recommends that patient gets a bleeding study to evaluate for source of bleeding.  I then spoke to Dr. OrOlevia Bowenshospitalist who states that patient will need a stepdown bed rather than a telemetry bed.  He is unsure when she will be able to get a stepdown bed so I feel more comfortable having an ED to ED transfer so she will at least be evaluated by the hospitalist and the GI provider.  Informed hospitalist of need of bleeding study as well as her changes in her vital signs today.  I have spoken to Dr. Maryan Rued at [facility] [address] who accepts the patient in transfer.  Of note her repeat hemoglobin is 9. Chart review shows that she had similar presentation in 2019.  Final Clinical Impression(s) / ED Diagnoses Final diagnoses:  Acute GI bleeding  Hematochezia  Elevated INR    Rx / DC Orders ED Discharge Orders    None     Portions of this note were generated with Dragon dictation software. Dictation errors may occur despite best attempts at proofreading.    Delia Heady, PA-C 06/18/19 1534    Lucrezia Starch, MD 06/19/19 1039

## 2019-06-18 NOTE — ED Notes (Signed)
Prior to administering Atropine, patient's heart rate dropped to the 30's and she became unresponsive for about 10 seconds. Immediately she responded and heart rate spontaneously went back up to the 70's.  Dr. Chauncey Cruel. Rancour came to bedside and gave verbal orders.

## 2019-06-18 NOTE — Consult Note (Signed)
Referring Provider:  Dr. Rockwell Alexandria Primary Care Physician:  Carollee Herter, Alferd Apa, DO Primary Gastroenterologist:  Dr. Oletta Lamas (now retired--pt plans to see Dr. Watt Climes)  Reason for Consultation: Recurrent hematochezia  HPI: [name] is a 79 y.o. female [place] who declines blood transfusion, admitted to the hospital through the emergency room, in transfer from [place], with a 1 day history of recurrent, progressively worsening hematochezia, culminating in a couple of episodes of bradycardia and syncope.    Today, her hemoglobin has fallen approximately 4 g.  Her INR was supratherapeutic at 4.3, for which she has received vitamin K.  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  Other GI issues include a history of perforated diverticulitis status post temporary colostomy with subsequent takedown, history of massive ventral hernia making colonoscopy technically difficult, history of small bowel obstruction, and anal stenosis with question of perianal Crohn's disease, chronic constipation managed with MiraLAX.  Medical issues include history of renal cell carcinoma status post bilateral nephrectomies and status post renal transplant, with chronic renal insufficiency in the transplanted kidney, atrial fibrillation maintained on chronic anticoagulation with Coumadin.  Colonoscopy in May 2019 showed extensive diverticulosis, but no vascular ectasia.  Since being transferred from [facility], it does appear that the patient's rate of bleeding has slowed down.  However, the patient feels woozy sitting up in bed.  No abdominal pain.  No prodromal GI symptoms, no antecedent passage of hard lumpy stools to suggest stercoral  ulceration as the source of bleeding.   Past Medical History:  Diagnosis Date  . Abdominal wall hernia   . Anemia    "when lupus flares up"  . Anxiety   . Arthritis    "in my joints"  . Atrial fibrillation ([place])   . CAP (community acquired pneumonia) 08/20/2014  . Crohn's disease ([place])   . DVT of leg (deep venous thrombosis) ([place]) 2012-8./13/2013   "have had one in each leg now"  . DVT of lower extremity, bilateral ([place])    "have had them in both legs; last one was on the RLE this year" (04/25/2012)  . GERD (gastroesophageal reflux disease)   . Gout 2016  . History of hiatal hernia   . History of kidney stones   . Hypertension   . Hypothyroidism   . Lower GI bleed 2012  . Numbness and tingling of foot    bilaterally  . OSA on CPAP   . Refusal of blood transfusion for reasons of conscience 12/20/2011   pt is Jehovah's Witness  . Renal cell carcinoma   . Renal disorder    S/P nephrectomy; dialysis; "working fine now" (12/20/11)  . SBO (small bowel obstruction) ([place]) 11/2016  . SLE (systemic lupus erythematosus) ([place])     Past Surgical History:  Procedure Laterality Date  . [place]   "found sluggish bone marrow" (04/25/2012)  . BONE MARROW BIOPSY  1993; 1995  . BREAST EXCISIONAL BIOPSY     right  . CATARACT EXTRACTION W/ INTRAOCULAR LENS  IMPLANT, BILATERAL  2005-2010  . CESAREAN SECTION  1984  . CHOLECYSTECTOMY  1995  . COLECTOMY  1998   "removal of abscess" (04/25/2012)  .  COLONOSCOPY N/A 08/30/2013   Procedure: COLONOSCOPY;  Surgeon: Winfield Cunas., MD;  Location: [facility];  Service: Endoscopy;  Laterality: N/A;  . COLONOSCOPY WITH PROPOFOL N/A 09/06/2017   Procedure: COLONOSCOPY WITH PROPOFOL;  Surgeon: Laurence Spates, MD;  Location: [facility];  Service: Endoscopy;  Laterality: N/A;  . COLOSTOMY  1998  . COLOSTOMY REVERSAL  1999   "6 months after placed" (04/25/2012)  . EXCISIONAL HEMORRHOIDECTOMY    . INSERTION OF DIALYSIS  CATHETER  1988-2006   "peritoneal and hemodialysis; had multiple grafts and fistulas; last graft clotted off 2012"  . NEPHRECTOMY TRANSPLANTED ORGAN  2006   bilaterally  . PARATHYROID IMPLANT REMOVAL Left    removed parathyroid from neck and implanted in arm  . VENA CAVA FILTER PLACEMENT  2012    Prior to Admission medications   Medication Sig Start Date End Date Taking? Authorizing Provider  acetaminophen (TYLENOL) 325 MG tablet Take 650 mg by mouth every 6 (six) hours as needed for mild pain.   Yes [provider]  allopurinol (ZYLOPRIM) 100 MG tablet Take 1 tablet (100 mg total) by mouth 2 (two) times daily. 07/18/16  Yes Roma Schanz R, DO  calcitRIOL (ROCALTROL) 0.25 MCG capsule Take 0.25-0.5 mcg by mouth See admin instructions. Take 0.5 mcg by mouth daily on Monday, Wednesday, Friday and Take 0.25 mcg by mouth daily on all other days   Yes [provider]  epoetin alfa (EPOGEN,PROCRIT) 71245 UNIT/ML injection Inject 40,000 Units into the skin every 21 ( twenty-one) days.    Yes [provider]  felodipine (PLENDIL) 10 MG 24 hr tablet Take 10 mg by mouth daily.   Yes [provider]  furosemide (LASIX) 80 MG tablet Take 80 mg by mouth daily. 05/02/19  Yes [provider]  gabapentin (NEURONTIN) 100 MG capsule Take 2 capsules (200 mg total) by mouth at bedtime. 07/18/16  Yes Ann Held, DO  levothyroxine (SYNTHROID, LEVOTHROID) 100 MCG tablet Take 1 tablet (100 mcg total) by mouth daily. 07/18/16  Yes Roma Schanz R, DO  magnesium oxide (MAG-OX) 400 MG tablet Take 1 tablet by mouth 2 (two) times daily. 05/06/19  Yes [provider]  MAGNESIUM-OXIDE 400 (241.3 MG) MG tablet Take 400 mg by mouth 2 (two) times daily. 05/19/14  Yes [provider]  Melatonin 5 MG CAPS Take 5 mg by mouth daily.    Yes [provider]  metoprolol tartrate (LOPRESSOR) 50 MG tablet Take 50 mg by mouth 2 (two) times daily.    Yes [provider]  mycophenolate (MYFORTIC) 180 MG EC tablet Take 360 mg by mouth 2 (two) times daily.     Yes [provider]  omeprazole (PRILOSEC) 20 MG capsule Take 20 mg by mouth daily. 07/20/16  Yes [provider]  polyethylene glycol (MIRALAX / GLYCOLAX) packet Take 17 g by mouth daily as needed for moderate constipation.    Yes [provider]  Polyethylene Glycol 400 (BLINK TEARS) 0.25 % GEL Place 1 drop into both eyes 2 (two) times daily as needed (for dry eyes).   Yes [provider]  predniSONE (DELTASONE) 5 MG tablet Take 5 mg by mouth daily.    Yes [provider]  Probiotic Product (PROBIOTIC PO) Take 2 tablets by mouth daily.   Yes [provider]  tacrolimus (PROGRAF) 1 MG capsule Take 3 mg by mouth 2 (two) times daily.    Yes [provider]  warfarin (COUMADIN)  5 MG tablet Take 5-7.5 mg by mouth See admin instructions. Pt takes 88m one day and alternates with 7.529mthe next and so forth. 05/30/19  Yes [provider]  cefdinir (OMNICEF) 300 MG capsule Take 1 capsule (300 mg total) by mouth 2 (two) times daily. Patient not taking: Reported on 06/18/2019 06/07/18   LoRoma Schanz, DO  warfarin (COUMADIN) 2.5 MG tablet Take 0.5-1 tablets (1.25-2.5 mg total) by mouth See admin instructions. Take 2.5 mg by mouth daily on Monday, Wednesday, Friday and Take 1.25 mg by mouth daily on all other days Patient not taking: Reported on 06/18/2019 02/13/18   GhJonetta OsgoodMD    Current Facility-Administered Medications  Medication Dose Route Frequency Provider Last Rate Last Admin  . 0.9 %  sodium chloride infusion   Intravenous PRN AlRigoberto NoelMD 10 mL/hr at 06/18/19 1855 1,000 mL at 06/18/19 1855  . atropine injection 0.4 mg  0.4 mg Intravenous Once PRN AlKara Mead, MD      . dextrose 5 %-0.9 % sodium chloride infusion   Intravenous Continuous AlRigoberto NoelMD 75 mL/hr at 06/18/19 1853 New Bag  at 06/18/19 1853  . epoetin alfa (EPOGEN) injection 30,000 Units  30,000 Units Subcutaneous Once AlRigoberto NoelMD      . hydrocortisone sodium succinate (SOLU-CORTEF) 100 MG injection 100 mg  100 mg Intravenous Q12H AlRigoberto NoelMD   100 mg at 06/18/19 1853  . [START ON 06/20/2019] levofloxacin (LEVAQUIN) IVPB 500 mg  500 mg Intravenous Q48H Poindexter, Leann T, RPH      . levofloxacin (LEVAQUIN) IVPB 750 mg  750 mg Intravenous STAT AlKara Mead, MD 100 mL/hr at 06/18/19 1857 750 mg at 06/18/19 1857  . metroNIDAZOLE (FLAGYL) IVPB 500 mg  500 mg Intravenous Q8H AlKara Mead, MD      . ondansetron (ZOFRAN) injection 4 mg  4 mg Intravenous Q6H PRN AlRigoberto NoelMD      . pantoprazole (PROTONIX) injection 40 mg  40 mg Intravenous Q12H AlRigoberto NoelMD       Current Outpatient Medications  Medication Sig Dispense Refill  . acetaminophen (TYLENOL) 325 MG tablet Take 650 mg by mouth every 6 (six) hours as needed for mild pain.    . Marland Kitchenllopurinol (ZYLOPRIM) 100 MG tablet Take 1 tablet (100 mg total) by mouth 2 (two) times daily. 30 tablet 2  . calcitRIOL (ROCALTROL) 0.25 MCG capsule Take 0.25-0.5 mcg by mouth See admin instructions. Take 0.5 mcg by mouth daily on Monday, Wednesday, Friday and Take 0.25 mcg by mouth daily on all other days    . epoetin alfa (EPOGEN,PROCRIT) 4095621NIT/ML injection Inject 40,000 Units into the skin every 21 ( twenty-one) days.     . felodipine (PLENDIL) 10 MG 24 hr tablet Take 10 mg by mouth daily.    . furosemide (LASIX) 80 MG tablet Take 80 mg by mouth daily.    . Marland Kitchenabapentin (NEURONTIN) 100 MG capsule Take 2 capsules (200 mg total) by mouth at bedtime.    . Marland Kitchenevothyroxine (SYNTHROID, LEVOTHROID) 100 MCG tablet Take 1 tablet (100 mcg total) by mouth daily.    . magnesium oxide (MAG-OX) 400 MG tablet Take 1 tablet by mouth 2 (two) times daily.    . Marland KitchenAGNESIUM-OXIDE 400 (241.3 MG) MG tablet Take 400 mg by mouth 2 (two) times daily.  0  . Melatonin 5 MG CAPS Take 5  mg by mouth daily.     .Marland Kitchen  metoprolol tartrate (LOPRESSOR) 50 MG tablet Take 50 mg by mouth 2 (two) times daily.    . mycophenolate (MYFORTIC) 180 MG EC tablet Take 360 mg by mouth 2 (two) times daily.      Marland Kitchen omeprazole (PRILOSEC) 20 MG capsule Take 20 mg by mouth daily.    . polyethylene glycol (MIRALAX / GLYCOLAX) packet Take 17 g by mouth daily as needed for moderate constipation.     . Polyethylene Glycol 400 (BLINK TEARS) 0.25 % GEL Place 1 drop into both eyes 2 (two) times daily as needed (for dry eyes).    . predniSONE (DELTASONE) 5 MG tablet Take 5 mg by mouth daily.     . Probiotic Product (PROBIOTIC PO) Take 2 tablets by mouth daily.    . tacrolimus (PROGRAF) 1 MG capsule Take 3 mg by mouth 2 (two) times daily.     Marland Kitchen warfarin (COUMADIN) 5 MG tablet Take 5-7.5 mg by mouth See admin instructions. Pt takes 31m one day and alternates with 7.577mthe next and so forth.    . cefdinir (OMNICEF) 300 MG capsule Take 1 capsule (300 mg total) by mouth 2 (two) times daily. (Patient not taking: Reported on 06/18/2019) 10 capsule 0  . warfarin (COUMADIN) 2.5 MG tablet Take 0.5-1 tablets (1.25-2.5 mg total) by mouth See admin instructions. Take 2.5 mg by mouth daily on Monday, Wednesday, Friday and Take 1.25 mg by mouth daily on all other days (Patient not taking: Reported on 06/18/2019)      Allergies as of 06/18/2019 - Review Complete 06/18/2019  Allergen Reaction Noted  . Amoxicillin Anaphylaxis   . Ampicillin Anaphylaxis   . Ciprofloxacin Swelling and Other (See Comments)   . Erythromycin Anaphylaxis   . Penicillins Anaphylaxis and Other (See Comments) 07/28/2011  . Tetracycline Anaphylaxis   . Labetalol Nausea And Vomiting 07/28/2011  . Sulfa antibiotics Itching 12/20/2011  . Sulfamethoxazole-trimethoprim Itching     Family History  Problem Relation Age of Onset  . Heart disease Mother   . Hypertension Other        runs in family  . Sleep apnea Sister     Social History   Socioeconomic  History  . Marital status: Married    Spouse name: Not on file  . Number of children: 3  . Years of education: Not on file  . Highest education level: Not on file  Occupational History  . Occupation: retired-bank tePsychologist, clinicalRETIRED  Tobacco Use  . Smoking status: Never Smoker  . Smokeless tobacco: Never Used  Substance and Sexual Activity  . Alcohol use: No  . Drug use: No  . Sexual activity: Yes    Birth control/protection: Post-menopausal  Other Topics Concern  . Not on file  Social History Narrative  . Not on file   Social Determinants of Health   Financial Resource Strain:   . Difficulty of Paying Living Expenses: Not on file  Food Insecurity:   . Worried About RuCharity fundraisern the Last Year: Not on file  . Ran Out of Food in the Last Year: Not on file  Transportation Needs:   . Lack of Transportation (Medical): Not on file  . Lack of Transportation (Non-Medical): Not on file  Physical Activity:   . Days of Exercise per Week: Not on file  . Minutes of Exercise per Session: Not on file  Stress:   . Feeling of Stress : Not on file  Social Connections:   .  Frequency of Communication with Friends and Family: Not on file  . Frequency of Social Gatherings with Friends and Family: Not on file  . Attends Religious Services: Not on file  . Active Member of Clubs or Organizations: Not on file  . Attends Archivist Meetings: Not on file  . Marital Status: Not on file  Intimate Partner Violence:   . Fear of Current or Ex-Partner: Not on file  . Emotionally Abused: Not on file  . Physically Abused: Not on file  . Sexually Abused: Not on file    Review of Systems: See HPI  Physical Exam: Vital signs in last 24 hours: Temp:  [98.3 F (36.8 C)-99.1 F (37.3 C)] 98.3 F (36.8 C) (02/09 1802) Pulse Rate:  [71-89] 87 (02/09 1915) Resp:  [16-26] 22 (02/09 1915) BP: (100-151)/(58-91) 114/91 (02/09 1915) SpO2:  [96 %-100 %] 100 % (02/09  1822) Weight:  [62.1 kg] 62.1 kg (02/09 1143)   General:   Alert,  Well-developed, well-nourished, pleasant and cooperative in NAD Head:  Normocephalic and atraumatic. Eyes:  Sclera clear, no icterus.   Lungs:  Clear throughout to auscultation.   No wheezes, crackles, or rhonchi. No evident respiratory distress. Heart:   Regular rate and rhythm; no murmurs, clicks, rubs,  or gallops. Abdomen: Multiple surgical scars with large but ill-defined ventral herniation Msk:   Symmetrical without gross deformities. Pulses:  Normal radial pulse is noted. Extremities:   Without clubbing, cyanosis, or edema. Neurologic:  Alert and coherent;  grossly normal neurologically. Skin:  Intact without significant lesions or rashes.  Warm and well perfused. Psych:   Alert and cooperative. Normal mood and affect.  Intake/Output from previous day: No intake/output data recorded. Intake/Output this shift: No intake/output data recorded.  Lab Results: Recent Labs    06/18/19 1219 06/18/19 1500 06/18/19 1709  WBC 12.5* 21.3* 18.3*  HGB 11.4* 9.0* 7.9*  HCT 38.0 29.8* 27.0*  PLT 185 125* 149*   BMET Recent Labs    06/18/19 1219 06/18/19 1709  NA 136 138  K 4.1 4.2  CL 102 107  CO2 23 22  GLUCOSE 138* 160*  BUN 54* 55*  CREATININE 2.16* 2.43*  CALCIUM 9.3 8.2*   LFT Recent Labs    06/18/19 1709  PROT 5.3*  ALBUMIN 2.9*  AST 16  ALT 11  ALKPHOS 89  BILITOT 0.4   PT/INR Recent Labs    06/18/19 1219 06/18/19 1709  LABPROT 41.6* 31.8*  INR 4.3* 3.1*    Studies/Results: No results found.  Impression: 1.  Recurrent hematochezia, most likely of diverticular origin.  No clinical or radiographic evidence to suggest ischemic colitis or stercoral ulceration.  No evidence of alternative sources such as neoplasia or vascular ectasia on relatively recent colonoscopy.  2.  History of previous diverticular bleeding and remote history of duodenal ulcer bleeding.  3.  Jehovah's Witness,  unable to administer blood products  4.  Chronic renal insufficiency in renal transplant, unable to use intravenous contrast (for example, for arteriography and embolization) without risk of the patient ending up on dialysis.  5.  Acute posthemorrhagic anemia  6.  Supratherapeutic anticoagulation, currently being reversed  7.  Multiple medical issues as listed above  Plan: 1.  I have recommended a bleeding scan to help clarify the patient's clinical picture, including degree of activity of bleeding and rough localization of bleeding source.  2.  In the event of life-threatening anemia, options would either be emergency surgery with resection of the  colon (probably a formidable undertaking given the patient's multiple previous operations and tenuous medical status), or attempts at IR embolization, with the recognition that the patient would almost certainly end up on dialysis thereafter.  3.  This patient is technically difficult to colonoscope, and I think it is very unlikely that colonoscopy would provide any meaningful diagnostic or therapeutic benefit.  Therefore, I do not foresee colonoscopy on this patient during this admission, at least during the acute phase of her bleeding.  4.  Agree with PPI therapy empirically and as stress ulcer prophylaxis, as well as of course reversal of the patient's anticoagulation.   LOS: 0 days   Youlanda Mighty Welford Christmas  06/18/2019, 8:18 PM   Pager [phone] If no answer or after 5 PM call [phone]

## 2019-06-18 NOTE — Progress Notes (Signed)
Pharmacy Antibiotic Note  [name] is a 79 y.o. female admitted on 06/18/2019 with lower GI bleed.  PMH significant for s/p renal transplant recipient, CKD stage IV, AFib (on warfarin).  Pharmacy has been consulted for Levaquin dosing for intra-abdominal infection.  Patient with a noted Cipro allergy; however Epic records show patient has received Levaquin in the past.  Plan: Levaquin 724m IV x 1 dose followed by 5061mIV q48h Follow renal function and adjust Levaquin dose as appropriate  Height: 5' 3"  (160 cm) Weight: 137 lb (62.1 kg) IBW/kg (Calculated) : 52.4  Temp (24hrs), Avg:98.7 F (37.1 C), Min:98.3 F (36.8 C), Max:99.1 F (37.3 C)  Recent Labs  Lab 06/18/19 1219 06/18/19 1500 06/18/19 1709  WBC 12.5* 21.3* 18.3*  CREATININE 2.16*  --   --     Estimated Creatinine Clearance: 18.3 mL/min (A) (by C-G formula based on SCr of 2.16 mg/dL (H)).    Allergies  Allergen Reactions  . Amoxicillin Anaphylaxis  . Ampicillin Anaphylaxis  . Ciprofloxacin Swelling and Other (See Comments)    "of my throat"  . Erythromycin Anaphylaxis  . Penicillins Anaphylaxis and Other (See Comments)    Has patient had a PCN reaction causing immediate rash, facial/tongue/throat swelling, SOB or lightheadedness with hypotension: Yes Has patient had a PCN reaction causing severe rash involving mucus membranes or skin necrosis: No Has patient had a PCN reaction that required hospitalization: Yes Has patient had a PCN reaction occurring within the last 10 years: Yes If all of the above answers are "NO", then may proceed with Cephalosporin use.  . Tetracycline Anaphylaxis  . Labetalol Nausea And Vomiting  . Sulfa Antibiotics Itching  . Sulfamethoxazole-Trimethoprim Itching    Antimicrobials this admission: 2/9 Levaquin >>    Thank you for allowing pharmacy to be a part of this patient's care.  PoEverette RankPharmD 06/18/2019 6:19 PM

## 2019-06-18 NOTE — ED Notes (Signed)
Patient transported to [facility].

## 2019-06-18 NOTE — H&P (Signed)
PULMONARY / CRITICAL CARE MEDICINE   NAME:  [name], MRN:  [id number], DOB:  12-27-1940, LOS: 0 ADMISSION DATE:  06/18/2019, CONSULTATION DATE:  06/18/2019  REFERRING MD:  Rancour ,EDP , CHIEF COMPLAINT: Lower GI bleeding  BRIEF HISTORY:    79 year old Jehovah's Witness renal transplant recipient with CKD stage IV, on Coumadin for proximal atrial fibrillation admitted with lower GI bleed INR 4.3   HISTORY OF PRESENT ILLNESS      She was transferred  from [facility]  ED to [facility] ED. she reported painless rectal bleeding for 1 day.  She had episode of bradycardia with loss of consciousness in [facility] and recovered before any medication was given.  She was noted to be passing clots in her stool.  GI was contacted who recommended bleeding scan and reversal of anticoagulation on arrival to [facility] ED she had a second episode with bradycardia in the 30s and was given a dose of atropine with quick recovery of her heart rate, blood pressure stayed in the low 100s.  She denied chest pain or abdominal pain.  She had a similar episode of lower GI bleeding in 2019 -where her hemoglobin went down to 1.9   Hb Currently dropped from 11.7-9  I have reviewed ED notes, prior cardiology notes SIGNIFICANT PAST MEDICAL HISTORY   CKD stage IV, renal transplant 2006 Hypertension Proximal atrial fibrillation and history of DVT on Coumadin 08/2016 hospitalized with SBO from adhesions hernia and chronic UTI TTE 12/27/18 moderate AR LV normal size moderate LVH asymmetric EF >65% normal root   SIGNIFICANT EVENTS:   STUDIES:   Bleeding scan 2/9 >>  CULTURES:    ANTIBIOTICS:  ceftx 2/9 >>  LINES/TUBES:    CONSULTANTS:  GI  SUBJECTIVE:    CONSTITUTIONAL: BP (!) 107/58 (BP Location: Right Arm)   Pulse 76   Temp 99.1 F (37.3 C) (Oral)   Resp 18   Ht 5' 3"  (1.6 m)   Wt 62.1 kg   LMP 03/05/2014   SpO2 100%   BMI 24.27 kg/m   No intake/output data recorded.        PHYSICAL  EXAM: General: Elderly woman, lying supine in ED, no distress able to speak in full sentences Neuro: Alert, interactive, nonfocal HEENT: Pallor ++, no icterus, no JVD Cardiovascular: S1-S2 regular sinus on monitor Lungs: Clear Abdomen: Soft, nontender, multiple scars, right hernia, reducible, nontender Musculoskeletal: No deformities , left arm AV fistula Skin: No bruising or rash  RESOLVED PROBLEM LIST   ASSESSMENT AND PLAN   Lower GI bleed in the setting of supratherapeutic INR -cause could be diverticulosis or related to Crohn's disease Jehovah's Witness -she absolutely refuses blood and blood products, okay with Procrit Bradycardia episode-?  Vagal due to GI bleeding , does not seem to be due to hemorrhagic shock since this is not persistent but raises concern that bleeding is significant   Lower GI bleeding- Hbglobin has dropped from 11.4-9.0, blood pressure stable Since she has CKD, cannot receive contrast, hence we will proceed with bleeding scan We will make IR aware Empiric antibiotic for diverticulitis GI has been contacted  Vagal episode with bradycardia -keep atropine at bedside  Coagulopathy -vit K 10 milligrams has been given TXA being administered Will not accept Feiba and [place]  CKD stg 4  /renal transplant recipient -avoid contrast Immunosuppressants will be held for now We will give her stress dose hydrocortisone  Jehovah -blood draws in pediatric tubes Procrit 40 K units, she was supposed  to get her dose tomorrow Iron stores seem okay She desires full code  All other home meds will be held for now and readdressed in a.m.  SUMMARY OF TODAY'S PLAN:  Plan for bleeding scan  Best Practice / Goals of Care / Disposition.   DVT PROPHYLAXIS: SCDs SUP: Protonix NUTRITION: N.p.o. MOBILITY: Bedrest GOALS OF CARE: Full code FAMILY DISCUSSIONS: Patient DISPOSITION ICU  LABS  Glucose No results for input(s): GLUCAP in the last 168 hours.  BMET Recent Labs   Lab 06/18/19 1219  NA 136  K 4.1  CL 102  CO2 23  BUN 54*  CREATININE 2.16*  GLUCOSE 138*    Liver Enzymes Recent Labs  Lab 06/18/19 1219  AST 18  ALT 12  ALKPHOS 122  BILITOT 0.7  ALBUMIN 4.0    Electrolytes Recent Labs  Lab 06/18/19 1219  CALCIUM 9.3    CBC Recent Labs  Lab 06/18/19 1219 06/18/19 1500  WBC 12.5* 21.3*  HGB 11.4* 9.0*  HCT 38.0 29.8*  PLT 185 125*    ABG No results for input(s): PHART, PCO2ART, PO2ART in the last 168 hours.  Coag's Recent Labs  Lab 06/18/19 1219  APTT 66*  INR 4.3*    Sepsis Markers No results for input(s): LATICACIDVEN, PROCALCITON, O2SATVEN in the last 168 hours.  Cardiac Enzymes No results for input(s): TROPONINI, PROBNP in the last 168 hours.  PAST MEDICAL HISTORY :   She  has a past medical history of Abdominal wall hernia, Anemia, Anxiety, Arthritis, Atrial fibrillation ([place]), CAP (community acquired pneumonia) (08/20/2014), Crohn's disease ([place]), DVT of leg (deep venous thrombosis) ([place]) (2012-8./13/2013), DVT of lower extremity, bilateral ([place]), GERD (gastroesophageal reflux disease), Gout (2016), History of hiatal hernia, History of kidney stones, Hypertension, Hypothyroidism, Lower GI bleed (2012), Numbness and tingling of foot, OSA on CPAP, Refusal of blood transfusion for reasons of conscience (12/20/2011), Renal cell carcinoma, Renal disorder, SBO (small bowel obstruction) ([place]) (11/2016), and SLE (systemic lupus erythematosus) ([place]).  PAST SURGICAL HISTORY:  She  has a past surgical history that includes Cesarean section (1984); Nephrectomy transplanted organ (2006); Colectomy (1998); Excisional hemorrhoidectomy; Cataract extraction w/ intraocular lens  implant, bilateral (2005-2010); Vena cava filter placement (2012); Insertion of dialysis catheter ([id number]); Cholecystectomy (1995); Colostomy (1998); Colostomy reversal (1999); Bone marrow biopsy (1993; 1995); Abdominal exploration surgery (1995);  Parathyroid implant removal (Left); Colonoscopy (N/A, 08/30/2013); Breast excisional biopsy; and Colonoscopy with propofol (N/A, 09/06/2017).  Allergies  Allergen Reactions  . Amoxicillin Anaphylaxis  . Ampicillin Anaphylaxis  . Ciprofloxacin Swelling and Other (See Comments)    "of my throat"  . Erythromycin Anaphylaxis  . Penicillins Anaphylaxis and Other (See Comments)    Has patient had a PCN reaction causing immediate rash, facial/tongue/throat swelling, SOB or lightheadedness with hypotension: Yes Has patient had a PCN reaction causing severe rash involving mucus membranes or skin necrosis: No Has patient had a PCN reaction that required hospitalization: Yes Has patient had a PCN reaction occurring within the last 10 years: Yes If all of the above answers are "NO", then may proceed with Cephalosporin use.  . Tetracycline Anaphylaxis  . Labetalol Nausea And Vomiting  . Sulfa Antibiotics Itching  . Sulfamethoxazole-Trimethoprim Itching    No current facility-administered medications on file prior to encounter.   Current Outpatient Medications on File Prior to Encounter  Medication Sig  . allopurinol (ZYLOPRIM) 100 MG tablet Take 1 tablet (100 mg total) by mouth 2 (two) times daily.  Marland Kitchen gabapentin (NEURONTIN) 100 MG capsule Take 2  capsules (200 mg total) by mouth at bedtime.  Marland Kitchen levothyroxine (SYNTHROID, LEVOTHROID) 100 MCG tablet Take 1 tablet (100 mcg total) by mouth daily.  Marland Kitchen warfarin (COUMADIN) 2.5 MG tablet Take 0.5-1 tablets (1.25-2.5 mg total) by mouth See admin instructions. Take 2.5 mg by mouth daily on Monday, Wednesday, Friday and Take 1.25 mg by mouth daily on all other days (Patient taking differently: Take 1.25-2.5 mg by mouth See admin instructions. Take 7.5 alternate to 59m the next day.)  . acetaminophen (TYLENOL) 325 MG tablet Take 650 mg by mouth every 6 (six) hours as needed for mild pain.  . calcitRIOL (ROCALTROL) 0.25 MCG capsule Take 0.25-0.5 mcg by mouth See admin  instructions. Take 0.5 mcg by mouth daily on Monday, Wednesday, Friday and Take 0.25 mcg by mouth daily on all other days  . cefdinir (OMNICEF) 300 MG capsule Take 1 capsule (300 mg total) by mouth 2 (two) times daily. (Patient not taking: Reported on 06/18/2019)  . epoetin alfa (EPOGEN,PROCRIT) 417616UNIT/ML injection Inject 40,000 Units into the skin every 21 ( twenty-one) days.   . felodipine (PLENDIL) 10 MG 24 hr tablet Take 10 mg by mouth daily.  . furosemide (LASIX) 40 MG tablet Take 40 mg by mouth 2 (two) times daily.   .Marland KitchenMAGNESIUM-OXIDE 400 (241.3 MG) MG tablet Take 400 mg by mouth 2 (two) times daily.  . Melatonin 5 MG CAPS Take 5 mg by mouth daily.   . metoprolol tartrate (LOPRESSOR) 50 MG tablet Take 50 mg by mouth 2 (two) times daily.  . mycophenolate (MYFORTIC) 180 MG EC tablet Take 360 mg by mouth 2 (two) times daily.    .Marland Kitchenomeprazole (PRILOSEC) 20 MG capsule Take 20 mg by mouth daily.  . polyethylene glycol (MIRALAX / GLYCOLAX) packet Take 17 g by mouth daily as needed for moderate constipation.   . Polyethylene Glycol 400 (BLINK TEARS) 0.25 % GEL Place 1 drop into both eyes 2 (two) times daily as needed (for dry eyes).  . predniSONE (DELTASONE) 5 MG tablet Take 5 mg by mouth daily.   . Probiotic Product (PROBIOTIC PO) Take 2 tablets by mouth daily.  . tacrolimus (PROGRAF) 1 MG capsule Take 3 mg by mouth 2 (two) times daily.     FAMILY HISTORY:   Her family history includes Heart disease in her mother; Hypertension in an other family member; Sleep apnea in her sister.  SOCIAL HISTORY:  She  reports that she has never smoked. She has never used smokeless tobacco. She reports that she does not drink alcohol or use drugs.  REVIEW OF SYSTEMS:    Positive for weakness , episode of passing out Denies shortness of breath, wheezing or cough Denies chest pain, palpitations Positive for nausea, no vomiting, no hematemesis or abdominal pain or diarrhea No stiff joints No bleeding from  any other site  RKara MeadMD. FCCP. [place] [facility]  If no response to pager , please call [phone]  06/18/2019

## 2019-06-18 NOTE — ED Notes (Signed)
Notified Lattie Haw, MUS to page for Dr. Olevia Bowens [facility]), and [facility].

## 2019-06-18 NOTE — ED Notes (Signed)
Will transport patient to ICU when nuclear med study is finished

## 2019-06-18 NOTE — Progress Notes (Signed)
[facility] Progress Note Patient Name: [name] DOB: 1940-06-25 MRN: [id number]   Date of Service  06/18/2019  HPI/Events of Note  Pt has a headache and is asking for an analgesic. She cannot get it PO or PR due to GI bleeding.  eICU Interventions  Ofirmev 1000 mg iv x 1        Zollie Clemence U Keaundra Stehle 06/18/2019, 11:18 PM

## 2019-06-19 ENCOUNTER — Telehealth: Payer: Self-pay | Admitting: Cardiovascular Disease

## 2019-06-19 ENCOUNTER — Observation Stay (HOSPITAL_COMMUNITY): Payer: Medicare Other

## 2019-06-19 DIAGNOSIS — R0602 Shortness of breath: Secondary | ICD-10-CM | POA: Diagnosis not present

## 2019-06-19 LAB — BASIC METABOLIC PANEL
Anion gap: 7 (ref 5–15)
Anion gap: 7 (ref 5–15)
BUN: 56 mg/dL — ABNORMAL HIGH (ref 8–23)
BUN: 57 mg/dL — ABNORMAL HIGH (ref 8–23)
CO2: 20 mmol/L — ABNORMAL LOW (ref 22–32)
CO2: 18 mmol/L — ABNORMAL LOW (ref 22–32)
Calcium: 7.9 mg/dL — ABNORMAL LOW (ref 8.9–10.3)
Calcium: 7.7 mg/dL — ABNORMAL LOW (ref 8.9–10.3)
Chloride: 111 mmol/L (ref 98–111)
Chloride: 113 mmol/L — ABNORMAL HIGH (ref 98–111)
Creatinine, Ser: 2.48 mg/dL — ABNORMAL HIGH (ref 0.44–1.00)
Creatinine, Ser: 2.51 mg/dL — ABNORMAL HIGH (ref 0.44–1.00)
GFR calc Af Amer: 21 mL/min — ABNORMAL LOW (ref >60)
GFR calc Af Amer: 21 mL/min — ABNORMAL LOW (ref >60)
GFR calc non Af Amer: 18 mL/min — ABNORMAL LOW (ref >60)
GFR calc non Af Amer: 18 mL/min — ABNORMAL LOW (ref >60)
Glucose, Bld: 180 mg/dL — ABNORMAL HIGH (ref 70–99)
Glucose, Bld: 116 mg/dL — ABNORMAL HIGH (ref 70–99)
Potassium: 6 mmol/L — ABNORMAL HIGH (ref 3.5–5.1)
Potassium: 5.9 mmol/L — ABNORMAL HIGH (ref 3.5–5.1)
Sodium: 138 mmol/L (ref 135–145)
Sodium: 138 mmol/L (ref 135–145)

## 2019-06-19 LAB — URINALYSIS, DIPSTICK ONLY
Bilirubin Urine: NEGATIVE
Glucose, UA: NEGATIVE mg/dL
Hgb urine dipstick: NEGATIVE
Ketones, ur: NEGATIVE mg/dL
Nitrite: NEGATIVE
Protein, ur: NEGATIVE mg/dL
Specific Gravity, Urine: 1.011 (ref 1.005–1.030)
pH: 5 (ref 5.0–8.0)

## 2019-06-19 LAB — CBC
HCT: 30.8 % — ABNORMAL LOW (ref 36.0–46.0)
Hemoglobin: 9.1 g/dL — ABNORMAL LOW (ref 12.0–15.0)
MCH: 28.3 pg (ref 26.0–34.0)
MCHC: 29.5 g/dL — ABNORMAL LOW (ref 30.0–36.0)
MCV: 95.7 fL (ref 80.0–100.0)
Platelets: 124 10*3/uL — ABNORMAL LOW (ref 150–400)
RBC: 3.22 MIL/uL — ABNORMAL LOW (ref 3.87–5.11)
RDW: 16.4 % — ABNORMAL HIGH (ref 11.5–15.5)
WBC: 7.1 10*3/uL (ref 4.0–10.5)
nRBC: 0 % (ref 0.0–0.2)

## 2019-06-19 LAB — TYPE AND SCREEN
ABO/RH(D): O POS
Antibody Screen: NEGATIVE

## 2019-06-19 LAB — POTASSIUM
Potassium: 5.5 mmol/L — ABNORMAL HIGH (ref 3.5–5.1)
Potassium: 5 mmol/L (ref 3.5–5.1)
Potassium: 5.4 mmol/L — ABNORMAL HIGH (ref 3.5–5.1)

## 2019-06-19 LAB — ABO/RH: ABO/RH(D): O POS

## 2019-06-19 LAB — HEMOGLOBIN AND HEMATOCRIT, BLOOD
HCT: 20.1 % — ABNORMAL LOW (ref 36.0–46.0)
HCT: 22 % — ABNORMAL LOW (ref 36.0–46.0)
HCT: 19.8 % — ABNORMAL LOW (ref 36.0–46.0)
HCT: 19.3 % — ABNORMAL LOW (ref 36.0–46.0)
Hemoglobin: 6.3 g/dL — CL (ref 12.0–15.0)
Hemoglobin: 6.5 g/dL — CL (ref 12.0–15.0)
Hemoglobin: 6.1 g/dL — CL (ref 12.0–15.0)
Hemoglobin: 5.6 g/dL — CL (ref 12.0–15.0)

## 2019-06-19 LAB — PROTIME-INR
INR: 1.5 — ABNORMAL HIGH (ref 0.8–1.2)
Prothrombin Time: 17.7 seconds — ABNORMAL HIGH (ref 11.4–15.2)

## 2019-06-19 LAB — NA AND K (SODIUM & POTASSIUM), RAND UR
Potassium Urine: 54 mmol/L
Sodium, Ur: 50 mmol/L

## 2019-06-19 LAB — NO BLOOD PRODUCTS

## 2019-06-19 MED ORDER — FUROSEMIDE 10 MG/ML IJ SOLN
40.0000 mg | Freq: Once | INTRAMUSCULAR | Status: AC
  Administered 2019-06-19: 40 mg via INTRAVENOUS
  Filled 2019-06-19: qty 4

## 2019-06-19 MED ORDER — EPOETIN ALFA 20000 UNIT/ML IJ SOLN
30000.0000 [IU] | Freq: Once | INTRAMUSCULAR | Status: AC
  Administered 2019-06-19: 30000 [IU] via SUBCUTANEOUS
  Filled 2019-06-19: qty 2

## 2019-06-19 MED ORDER — MELATONIN 5 MG PO TABS
5.0000 mg | ORAL_TABLET | Freq: Every evening | ORAL | Status: DC | PRN
  Administered 2019-06-19 – 2019-06-22 (×4): 5 mg via ORAL
  Filled 2019-06-19 (×4): qty 1

## 2019-06-19 MED ORDER — ACETAMINOPHEN 325 MG PO TABS
650.0000 mg | ORAL_TABLET | Freq: Four times a day (QID) | ORAL | Status: DC | PRN
  Administered 2019-06-19 – 2019-06-21 (×3): 650 mg via ORAL
  Filled 2019-06-19 (×3): qty 2

## 2019-06-19 MED ORDER — SODIUM ZIRCONIUM CYCLOSILICATE 10 G PO PACK
10.0000 g | PACK | Freq: Once | ORAL | Status: AC
  Administered 2019-06-19: 10 g via ORAL
  Filled 2019-06-19: qty 1

## 2019-06-19 MED ORDER — SODIUM POLYSTYRENE SULFONATE 15 GM/60ML PO SUSP: 30.0000 g | Freq: Once | ORAL | Status: DC

## 2019-06-19 MED ORDER — SODIUM ZIRCONIUM CYCLOSILICATE 10 G PO PACK
10.0000 g | PACK | Freq: Once | ORAL | Status: DC
  Filled 2019-06-19: qty 1

## 2019-06-19 MED ORDER — SODIUM CHLORIDE 0.9 % IV BOLUS
500.0000 mL | Freq: Once | INTRAVENOUS | Status: AC
  Administered 2019-06-19: 500 mL via INTRAVENOUS

## 2019-06-19 MED ORDER — SODIUM BICARBONATE 8.4 % IV SOLN
INTRAVENOUS | Status: DC
  Filled 2019-06-19 (×7): qty 100

## 2019-06-19 MED ORDER — LIP MEDEX EX OINT
TOPICAL_OINTMENT | CUTANEOUS | Status: DC | PRN
  Filled 2019-06-19: qty 7

## 2019-06-19 NOTE — Telephone Encounter (Signed)
Dr. Cristina Gong is aware of Dr. Kyla Balzarine recommendations.

## 2019-06-19 NOTE — Telephone Encounter (Signed)
Dr. Cristina Gong with [facility] is calling to requesting to stop the patient's coumadin due to recurrent GI bleeding. Please call to discuss.

## 2019-06-19 NOTE — Progress Notes (Signed)
Had to take BP on Left arm due to inaccurate readings on both legs.

## 2019-06-19 NOTE — Progress Notes (Signed)
eLink Physician-Brief Progress Note Patient Name: [name] DOB: Aug 12, 1940 MRN: [id number]   Date of Service  06/19/2019  HPI/Events of Note  Patient is NPO  eICU Interventions  Lokelma order discontinued. Rectal Kayexalate substituted.        [name] 06/19/2019, 4:11 AM

## 2019-06-19 NOTE — Telephone Encounter (Signed)
She has had a DVT and PAF However it looks like she's had recurrent GI bleeding and Hb 6 yesterday I see no notes by Dr Cristina Gong Noted bleeding scan yesterday negative. Ok to stop coumadin unless Bleeding source can be found and RX

## 2019-06-19 NOTE — Progress Notes (Signed)
eLink Physician-Brief Progress Note Patient Name: [name] DOB: Feb 08, 1941 MRN: [id number]   Date of Service  06/19/2019  HPI/Events of Note  Insomnia  eICU Interventions  Melatonin 5 mg po HS PRN insomnia        Frederik Pear 06/19/2019, 9:56 PM

## 2019-06-19 NOTE — Progress Notes (Signed)
RN contacted [facility] doctor about lasix order and stressed concern about dropping pt's blood pressure. After 576m bolus bp is 100/49 (67). RN was advised by E-link doctor (Ogan) to proceed with giving lasix. RN will give lasix and monitor the pt's bp closely.

## 2019-06-19 NOTE — Progress Notes (Signed)
CRITICAL VALUE ALERT  Critical Value:  hgb 6.3   Date & Time Notied:  06/19/2019, 0820  Provider Notified: Loree Fee, NP CCM   Orders Received/Actions taken: Patient refusing blood products

## 2019-06-19 NOTE — Progress Notes (Signed)
Patient last hemoglobin was 6.1. Patient refusing blood products, refusal in chart. Patient was lightheaded and nauseated when up to the bedside commode. Zofran given. Merlene Laughter, NP made aware.

## 2019-06-19 NOTE — Progress Notes (Signed)
Note soft bp, elevated K  Lab Results  Component Value Date   CREATININE 2.51 (H) 06/19/2019   CREATININE 2.48 (H) 06/19/2019   CREATININE 2.43 (H) 06/18/2019      Intake/Output Summary (Last 24 hours) at 06/19/2019 0650 Last data filed at 06/19/2019 0500 Gross per 24 hour  Intake 1148.39 ml  Output --  Net 1148.39 ml     Rec:  Change fluids to d5 with 2 amps NaHc03  Check cxr for edema Place foley to monitor uop  Christinia Gully, MD Pulmonary and [place] [phone] After 5:30 PM or weekends, use [facility] [phone]

## 2019-06-19 NOTE — Progress Notes (Signed)
PULMONARY / CRITICAL CARE MEDICINE   NAME:  [name], MRN:  [id number], DOB:  Jan 21, 1941, LOS: 1 ADMISSION DATE:  06/18/2019, CONSULTATION DATE:  06/19/2019  REFERRING MD:  Rancour ,EDP , CHIEF COMPLAINT: Lower GI bleeding  BRIEF HISTORY:    79 year old Jehovah's Witness renal transplant recipient with CKD stage IV, on Coumadin for proximal atrial fibrillation admitted with lower GI bleed INR 4.3  HISTORY OF PRESENT ILLNESS   She was transferred  from [facility]  ED to [facility] ED. she reported painless rectal bleeding for 1 day.  She had episode of bradycardia with loss of consciousness in [facility] and recovered before any medication was given.  She was noted to be passing clots in her stool.  GI was contacted who recommended bleeding scan and reversal of anticoagulation on arrival to [facility] ED she had a second episode with bradycardia in the 30s and was given a dose of atropine with quick recovery of her heart rate, blood pressure stayed in the low 100s.  She denied chest pain or abdominal pain.  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% normal root   SIGNIFICANT EVENTS:   STUDIES:   Bleeding scan 2/9 >> unable to tolerate complete scan  Micro data:  Covid 2/9 > negative MRSA PCR 2/9 > negative  ANTIBIOTICS:  ceftx 2/9 >>  LINES/TUBES:    CONSULTANTS:  GI   SUBJECTIVE:    CONSTITUTIONAL: BP (!) 124/56   Pulse 88   Temp 98 F (36.7 C) (Oral)   Resp 18   Ht 5' 3"  (1.6 m)   Wt 62.2 kg   LMP 03/05/2014   SpO2 100%   BMI 24.29 kg/m   I/O last 3 completed shifts: In: 1148.4 [I.V.:628.1; IV  Piggyback:520.3] Out: -         PHYSICAL EXAM: General: Very pleasant elderly female lying in bed in no acute distress, slightly pale in appearance HEENT: Head is atraumatic, MM pink/moist, PERRL,  Neuro: Alert and oriented x3, nonfocal, denies any dizziness or lightheadedness CV: s1s2 regular rate and rhythm, no murmur, rubs, or gallops,  PULM: Clear to auscultation bilaterally, no increased work of breathing, oxygen saturations 98 to 100% on room air GI: soft, bowel sounds active in all 4 quadrants, non-tender, non-distended, right-sided upper quadrant hernia Extremities: warm/dry, no edema  Skin: no rashes or lesions  RESOLVED PROBLEM LIST   ASSESSMENT AND PLAN   Lower GI bleeding in the setting of supratherapeutic INR and history of Crohn disease -Hemoglobin trend 11.1> 9.0> 7.9> 9.1> 6.3 -Patient reports prior diagnosis of diverticulosis with prior episode of diverticular bleed -Patient is Jehovah's Witness and therefore due to religious believes would not want any transfusion of blood products P: Trend CBC with utilization of pediatric tubes to decrease blood draw around Awaiting official results of bleeding scan, patient states unable to tolerate total screen time Continue empiric antibiotics GI consulted, appreciate assistance Monitor INR status post vitamin K  History of atrial fibrillation chronically anticoagulated Supratherapeutic INR -INR 4.3 on admission P: Continue anticoagulation see to assist with dosing Received dose of vitamin K and TXA 2/9 Will accept Feiba  and Celina Follow INR  Vagal episode with bradycardia -Due to large GI bleed and significant anemia P: Close monitoring and assistance with any relation Keep atropine at bedside Continuous telemetry  CKD stg 4  History of renal transplant P: Follow renal function / urine output Trend Bmet Avoid nephrotoxins, ensure adequate renal perfusion  Continue stress dose steroids Immunosuppressant  currently on hold  Rest of management of chronic medical conditions per primary team  SUMMARY OF TODAY'S PLAN:  Close monitoring of blood counts, await read of bleeding scan.  If patient rebleeds and  becomes significantly will plan for symptomatic anemia at that time  Best Practice / Goals of Care / Disposition.   DVT PROPHYLAXIS: SCDs SUP: Protonix NUTRITION: N.p.o. MOBILITY: Bedrest GOALS OF CARE: Full code FAMILY DISCUSSIONS: Patient DISPOSITION ICU  LABS  Glucose No results for input(s): GLUCAP in the last 168 hours.  BMET Recent Labs  Lab 06/18/19 1709 06/18/19 1709 06/19/19 0211 06/19/19 0551 06/19/19 0759  NA 138  --  138 138  --   K 4.2   < > 6.0* 5.9* 5.5*  CL 107  --  111 113*  --   CO2 22  --  20* 18*  --   BUN 55*  --  56* 57*  --   CREATININE 2.43*  --  2.48* 2.51*  --   GLUCOSE 160*  --  180* 116*  --    < > = values in this interval not displayed.    Liver Enzymes Recent Labs  Lab 06/18/19 1219 06/18/19 1709  AST 18 16  ALT 12 11  ALKPHOS 122 89  BILITOT 0.7 0.4  ALBUMIN 4.0 2.9*    Electrolytes Recent Labs  Lab 06/18/19 1709 06/19/19 0211 06/19/19 0551  CALCIUM 8.2* 7.9* 7.7*    CBC Recent Labs  Lab 06/18/19 1500 06/18/19 1500 06/18/19 1709 06/18/19 1709 06/18/19 2305 06/19/19 0211 06/19/19 0759  WBC 21.3*  --  18.3*  --   --  7.1  --   HGB 9.0*   < > 7.9*   < > 7.9* 9.1* 6.3*  HCT 29.8*   < > 27.0*   < > 26.9* 30.8* 20.1*  PLT 125*  --  149*  --   --  124*  --    < > = values in this interval not displayed.   Scheduled Meds: . Chlorhexidine Gluconate Cloth  6 each Topical Daily  . hydrocortisone sod succinate (SOLU-CORTEF) inj  100 mg Intravenous Q12H  . mouth rinse  15 mL Mouth Rinse BID  . pantoprazole (PROTONIX) IV  40 mg Intravenous Q12H  . sodium polystyrene  30 g Rectal Once   Continuous Infusions: . sodium chloride Stopped (06/18/19 2338)  . [START ON 06/20/2019] levofloxacin (LEVAQUIN) IV    . metronidazole  Stopped (06/19/19 0440)  .  sodium bicarbonate  infusion 1000 mL 150 mL/hr at 06/19/19 0834   PRN Meds:.sodium chloride, atropine, lip balm, ondansetron (ZOFRAN) IV   ABG No results for input(s): PHART, PCO2ART, PO2ART in the last 168 hours.  Coag's Recent Labs  Lab 06/18/19 1219 06/18/19 1709  APTT 66*  --   INR 4.3* 3.1*    Sepsis Markers No results for input(s): LATICACIDVEN, PROCALCITON, O2SATVEN in the last 168 hours.  Cardiac Enzymes No results for input(s): TROPONINI, PROBNP in the last 168 hours.  CRITICAL CARE Performed by: Johnsie Cancel   Total critical care time: 35 minutes  Critical care time was exclusive of separately  billable procedures and treating other patients.  Critical care was necessary to treat or prevent imminent or life-threatening deterioration.  Critical care was time spent personally by me on the following activities: development of treatment plan with patient and/or surrogate as well as nursing, discussions with consultants, evaluation of patient's response to treatment, examination of patient, obtaining history from patient or surrogate, ordering and performing treatments and interventions, ordering and review of laboratory studies, ordering and review of radiographic studies, pulse oximetry and re-evaluation of patient's condition.  Johnsie Cancel, NP-C [place] [facility] Contact / Pager information can be found on Amion  06/19/2019, 9:55 AM

## 2019-06-19 NOTE — Progress Notes (Signed)
Bleeding scan negative last night.  Patient states she feels much better, and she looks well, sitting up in bed (unlike yesterday evening, when she felt faint sitting up in bed).  Her bleeding has diminished substantially.  Hemoglobin this morning is down to 6.3.  INR as of yesterday evening was 3.1.  Plan:   1.  INR with next blood draw.  Consider further vitamin K if still elevated.  2.  I agree with Dr. Melvyn Novas that we should revisit whether this patient should be maintained on chronic anticoagulation once this bleeding episode is over.  To help in that decision, I will plan to contact her nephrologist, Dr. Jimmy Footman.   3.  Furthermore, I would recommend that ICU team either talk with her cardiologist, Dr. Leonides Sake, or obtain a cardiology consult, to get their opinion about ongoing anticoagulation.  Cleotis Nipper, M.D. Pager [phone] If no answer or after 5 PM call [phone]

## 2019-06-19 NOTE — Progress Notes (Signed)
eLink Physician-Brief Progress Note Patient Name: [name] DOB: Jun 06, 1940 MRN: [id number]   Date of Service  06/19/2019  HPI/Events of Note  K + 6.0  eICU Interventions  Lokelma 10 gm po x 1        Jimesha Rising U Prudie Guthridge 06/19/2019, 3:54 AM

## 2019-06-20 ENCOUNTER — Encounter (HOSPITAL_COMMUNITY): Payer: Medicare Other

## 2019-06-20 LAB — HEMOGLOBIN AND HEMATOCRIT, BLOOD
HCT: 19.3 % — ABNORMAL LOW (ref 36.0–46.0)
HCT: 18.6 % — ABNORMAL LOW (ref 36.0–46.0)
HCT: 19.1 % — ABNORMAL LOW (ref 36.0–46.0)
HCT: 21.4 % — ABNORMAL LOW (ref 36.0–46.0)
HCT: 19.4 % — ABNORMAL LOW (ref 36.0–46.0)
Hemoglobin: 5.7 g/dL — CL (ref 12.0–15.0)
Hemoglobin: 5.7 g/dL — CL (ref 12.0–15.0)
Hemoglobin: 5.9 g/dL — CL (ref 12.0–15.0)
Hemoglobin: 6.1 g/dL — CL (ref 12.0–15.0)
Hemoglobin: 6.1 g/dL — CL (ref 12.0–15.0)

## 2019-06-20 LAB — BASIC METABOLIC PANEL
Anion gap: 8 (ref 5–15)
Anion gap: 8 (ref 5–15)
BUN: 52 mg/dL — ABNORMAL HIGH (ref 8–23)
BUN: 43 mg/dL — ABNORMAL HIGH (ref 8–23)
CO2: 26 mmol/L (ref 22–32)
CO2: 26 mmol/L (ref 22–32)
Calcium: 8.2 mg/dL — ABNORMAL LOW (ref 8.9–10.3)
Calcium: 8.3 mg/dL — ABNORMAL LOW (ref 8.9–10.3)
Chloride: 105 mmol/L (ref 98–111)
Chloride: 103 mmol/L (ref 98–111)
Creatinine, Ser: 2.62 mg/dL — ABNORMAL HIGH (ref 0.44–1.00)
Creatinine, Ser: 2.51 mg/dL — ABNORMAL HIGH (ref 0.44–1.00)
GFR calc Af Amer: 20 mL/min — ABNORMAL LOW (ref >60)
GFR calc Af Amer: 21 mL/min — ABNORMAL LOW (ref >60)
GFR calc non Af Amer: 17 mL/min — ABNORMAL LOW (ref >60)
GFR calc non Af Amer: 18 mL/min — ABNORMAL LOW (ref >60)
Glucose, Bld: 118 mg/dL — ABNORMAL HIGH (ref 70–99)
Glucose, Bld: 152 mg/dL — ABNORMAL HIGH (ref 70–99)
Potassium: 3.8 mmol/L (ref 3.5–5.1)
Potassium: 4.1 mmol/L (ref 3.5–5.1)
Sodium: 139 mmol/L (ref 135–145)
Sodium: 137 mmol/L (ref 135–145)

## 2019-06-20 MED ORDER — MYCOPHENOLATE SODIUM 180 MG PO TBEC
360.0000 mg | DELAYED_RELEASE_TABLET | Freq: Two times a day (BID) | ORAL | Status: DC
  Administered 2019-06-20 – 2019-06-23 (×7): 360 mg via ORAL
  Filled 2019-06-20 (×8): qty 2

## 2019-06-20 MED ORDER — HYDROCORTISONE NA SUCCINATE PF 100 MG IJ SOLR
50.0000 mg | Freq: Two times a day (BID) | INTRAMUSCULAR | Status: DC
  Administered 2019-06-20 – 2019-06-23 (×6): 50 mg via INTRAVENOUS
  Filled 2019-06-20 (×6): qty 2

## 2019-06-20 MED ORDER — TACROLIMUS 1 MG PO CAPS
3.0000 mg | ORAL_CAPSULE | Freq: Two times a day (BID) | ORAL | Status: DC
  Administered 2019-06-20 – 2019-06-23 (×7): 3 mg via ORAL
  Filled 2019-06-20 (×8): qty 3

## 2019-06-20 MED ORDER — SODIUM CHLORIDE 0.9% IV SOLUTION: Freq: Once | INTRAVENOUS | Status: DC

## 2019-06-20 MED ORDER — SIMETHICONE 80 MG PO CHEW
80.0000 mg | CHEWABLE_TABLET | Freq: Four times a day (QID) | ORAL | Status: DC | PRN
  Administered 2019-06-20 – 2019-06-21 (×3): 80 mg via ORAL
  Filled 2019-06-20 (×3): qty 1

## 2019-06-20 MED ORDER — HYDRALAZINE HCL 20 MG/ML IJ SOLN
5.0000 mg | Freq: Four times a day (QID) | INTRAMUSCULAR | Status: DC | PRN
  Administered 2019-06-22: 5 mg via INTRAVENOUS
  Filled 2019-06-20: qty 1

## 2019-06-20 MED ORDER — LEVOTHYROXINE SODIUM 100 MCG PO TABS
100.0000 ug | ORAL_TABLET | Freq: Every day | ORAL | Status: DC
  Administered 2019-06-20 – 2019-06-23 (×4): 100 ug via ORAL
  Filled 2019-06-20 (×4): qty 1

## 2019-06-20 NOTE — Progress Notes (Signed)
CRITICAL VALUE ALERT  Critical Value:  5.7 Hgb  Date & Time Notied:  06/20/2019 0415  Provider Notified: E-link   Orders Received/Actions taken: Awaiting further orders.

## 2019-06-20 NOTE — Progress Notes (Signed)
Pt brought to RN's attention that she had not been receiving her anti-rejection medications for her past kidney transplant. RN called the pharmacist and they were made aware of the situation. RN will discuss the anti-rejection medications with the day-time RN and the daytime MD to see if rescheduling these medications will be an option for this pt.

## 2019-06-20 NOTE — Progress Notes (Signed)
The patient continues to pass small amounts of blood, but the patient gets the impression that it is markedly tapered off.  Without transfusion, there has been a gradual decline in her hemoglobin level, but it has been stable at approximately 5.9 (the current level) for the past 12 hours.  Meanwhile, the patient is tolerating her anemia well.  She is sitting up in bed, having a clear liquid tray, fully alert, no evident distress, no complaints.  Her husband is at the bedside.  She is fully mentally coherent.  On exam, vitals are normal, chest clear, heart normal, abdomen nontender, mental status per above.  Labs: Per above.  INR as of yesterday had come down to 1.5.  Impression:   1.  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   2.  Posthemorrhagic anemia, severe, and a Jehovah's Witness who declines blood transfusions for religious reasons.  3.  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  Spoke with the patient's nephrologist, Dr. Jeneen Rinks Deterding, who feels that the patient's history of DVT is so far in the remote past that chronic anticoagulation is not needed for that indication.  Also was in contact with the patient's cardiologist, Dr. Leonides Sake, who feels that it is okay to permanently stop the patient's anticoagulation, unless the cause of the patient's bleeding can be identified and corrected.  2.  I think that one thing everybody agrees on is that this patient is not a candidate for resumption of chronic anticoagulation, unless her source of bleeding (presumably her diverticular disease) can be corrected.  3.  Realistically, the patient would not have elective colon resection while anemic, so the patient will have to be off Coumadin for the near term, in any event.  4.  I  have recommended that the patient make an elective outpatient visit with her cardiologist to ascertain the statistical probability of her having complications from not being on anticoagulation (based on chads score)  5.  Depending on the cardiologist's estimate of risk of not being anticoagulated, the patient could also seek surgical consultation to discuss the option of surgical colonic resection; based on her most recent colonoscopy report from 2 years ago, this would probably only have to involve the left colon.  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  6.  The patient understands that even if colon resection could be accomplished, it would not 100% guarantee the absence of recurrent bleeding.  Similarly, resumption of chronic anticoagulation after resection would not guarantee protection from a stroke.  GI will continue to follow.  Okay from our standpoint to advance patient's diet, since the patient is not likely to be undergoing colonoscopy this admission.  Have ordered heart healthy diet.  Cleotis Nipper, M.D. Pager [phone] If no answer or after 5 PM call [phone]

## 2019-06-20 NOTE — Progress Notes (Signed)
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 she reported painless rectal bleeding for 1 day.  She had episode of bradycardia with loss of consciousness in [facility] and recovered before any medication was given.  She was noted to be passing clots in her stool.  GI was contacted who recommended bleeding scan and reversal of anticoagulation on arrival to [facility] ED she had a second episode with bradycardia in the 30s and was given a dose of atropine with quick recovery of her heart rate, blood pressure stayed in the low 100s.  She denied chest pain or abdominal pain.  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% normal root   SIGNIFICANT EVENTS:   STUDIES:   Bleeding scan 2/9 >> unable to tolerate complete scan  Micro data:  Covid 2/9 > negative MRSA PCR 2/9 > negative  ANTIBIOTICS:  ceftx 2/9 >>  LINES/TUBES:   CONSULTANTS:  GI  Cardiology   SUBJECTIVE:    CONSTITUTIONAL: BP (!) 143/46   Pulse 100   Temp 98.7 F (37.1 C) (Oral)   Resp 17   Ht 5' 3"  (1.6 m)   Wt 64.1 kg   LMP 03/05/2014   SpO2 100%   BMI 25.03 kg/m   I/O last 3 completed shifts: In: 3880.1  [I.V.:2885.7; IV Piggyback:994.4] Out: 700 [Urine:700]   PHYSICAL EXAM: General: Elderly female, sitting in bed, no distress  HEENT: MMM  Neuro: Alert, oriented, follows commands  CV: Irregular, no MRG  PULM: Clear breath sounds, no wheeze/crackles  GI: soft, bowel sounds active in all 4 quadrants, non-tender, non-distended, right-sided upper quadrant hernia Extremities: warm/dry, no edema  Skin: no rashes or lesions  RESOLVED PROBLEM LIST   ASSESSMENT AND PLAN    Lower GI bleeding in the setting of supratherapeutic INR and history of Crohn disease -Hemoglobin trend 11.1> 9.0> 7.9> 9.1> 6.3 -Patient reports prior diagnosis of diverticulosis with prior episode of diverticular bleed -Patient is Jehovah's Witness and therefore due to religious believes would not want any transfusion of blood products -NM Red tag study 2/9 with no evidence of active GI Bleed  P: Trend CBC with utilization of pediatric tubes to decrease blood draw around Continue empiric antibiotics >> Levaquin dosed by pharmacy, Day 2 of Flagyl GI following  Trend INR   History of atrial fibrillation chronically anticoagulated Supratherapeutic INR -INR 4.3 on admission P: Cardiology Consult 2/10 > Plans to hold anticoagulation at this time due to continued bleeding  Received dose of vitamin K and TXA 2/9 Will accept Fe[place] and [place] Follow INR  Vagal episode with bradycardia -Due to large GI bleed and significant anemia P: Close monitoring and assistance with any relation  Keep atropine at bedside Continuous telemetry  Acute on CKD stg 4 (Baseline Crt 1.7-2)  -Followed by Dr. 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   DVT PROPHYLAXIS: SCDs SUP: Protonix NUTRITION: Clear Liquid  MOBILITY: Bedrest GOALS OF CARE: Full code FAMILY DISCUSSIONS: Patient DISPOSITION Transfer to Step-down with [facility]   LABS  Glucose No results for input(s): GLUCAP in the last 168 hours.  BMET Recent Labs  Lab 06/19/19 0211 06/19/19 0211 06/19/19 0551 06/19/19 0759 06/19/19 1052 06/19/19 1212 06/20/19 0533  NA 138  --  138  --   --   --  139  K 6.0*   < > 5.9*   < > 5.0 5.4* 3.8  CL 111  --  113*  --   --   --  105  CO2 20*  --  18*  --   --   --  26  BUN 56*  --  57*  --   --   --  52*  CREATININE 2.48*  --  2.51*  --   --   --  2.62*  GLUCOSE 180*  --  116*  --   --   --  118*   < > = values in this interval not displayed.    Liver Enzymes Recent Labs  Lab 06/18/19 1219 06/18/19 1709  AST 18 16  ALT 12 11  ALKPHOS 122 89  BILITOT 0.7 0.4  ALBUMIN 4.0 2.9*    Electrolytes Recent Labs  Lab 06/19/19 0211 06/19/19 0551 06/20/19 0533  CALCIUM 7.9* 7.7* 8.2*    CBC Recent Labs  Lab 06/18/19 1500 06/18/19 1500 06/18/19 1709 06/18/19 2305 06/19/19 0211 06/19/19 0759 06/19/19 2015 06/20/19 0533 06/20/19 0853  WBC 21.3*  --  18.3*  --  7.1  --   --   --   --   HGB 9.0*   < > 7.9*   < > 9.1*   < > 5.7* 5.7* 5.9*  HCT 29.8*   < > 27.0*   < > 30.8*   < > 19.3* 18.6* 19.1*  PLT 125*  --  149*  --  124*  --   --   --   --    < > = values in this interval not displayed.   Scheduled Meds: . Chlorhexidine Gluconate Cloth  6 each Topical Daily  . hydrocortisone sod succinate (SOLU-CORTEF) inj  50 mg Intravenous Q12H  . levothyroxine  100 mcg Oral Q0600  . mouth rinse  15 mL  Mouth Rinse BID  . pantoprazole (PROTONIX) IV  40 mg Intravenous Q12H   Continuous Infusions: . sodium chloride Stopped (06/18/19 2338)  . levofloxacin (LEVAQUIN) IV    . metronidazole Stopped (06/20/19 0529)  .  sodium bicarbonate  infusion 1000 mL 150 mL/hr at 06/20/19 0222   PRN Meds:.sodium chloride, acetaminophen, atropine, hydrALAZINE, lip balm, Melatonin, ondansetron (ZOFRAN)  IV   ABG No results for input(s): PHART, PCO2ART, PO2ART in the last 168 hours.  Coag's Recent Labs  Lab 06/18/19 1219 06/18/19 1709 06/19/19 1052  APTT 66*  --   --   INR 4.3* 3.1* 1.5*    Sepsis Markers No results for input(s): LATICACIDVEN, PROCALCITON, O2SATVEN in the last 168 hours.  Cardiac Enzymes No results for input(s): TROPONINI, PROBNP in the last 168 hours.   Hayden Pedro, AGACNP-BC [facility]  Pgr: [phone]  PCCM Pgr: [phone]

## 2019-06-20 NOTE — Progress Notes (Signed)
CRITICAL VALUE ALERT  Critical Value:  Hgb 6.1  Date & Time Notied: 1661 06/20/19  Provider Notified: CCM  Orders Received/Actions taken: No new orders, hgb trending up

## 2019-06-20 NOTE — Progress Notes (Signed)
Notified by ED front desk that patient's husband is here wanting to come up to visit.  Advised ED front desk that I would call them back in just a few minutes, but first wanted to talk to patient.  Patient advised that she called husband because she couldn't sleep and was concerned due to some rectal bleeding.  I explained the current visitation restrictions to the patient, which included on admission one primary visitor is named and that visitor is the only one that can currently visit, and that visitation hours for that one person is from 10:00am to 8:00pm.  I further explained the reason for the restrictions is for the safety of our patients.  Patient stated that she was unaware of the visitation restrictions and immediately called husband to explain.  I also talked with husband on the phone, husband understood.  Called back to the ED front desk and I was advised they had already left the hospital.  Jacqulyn Ducking ICU/SD [facility] / Care Coordinator / Rapid Response Nurse Rapid Response Number:  [phone] ICU Charge Nurse Number:  [phone]

## 2019-06-20 NOTE — Progress Notes (Signed)
[facility] Progress Note Patient Name: URANIA PEARLMAN DOB: 03/24/1941 MRN: [id number]   Date of Service  06/20/2019  HPI/Events of Note  Hemoglobin 5.7  eICU Interventions  Transfuse 2 units PRBC        Ted Goodner U Landon Bassford 06/20/2019, 5:03 AM

## 2019-06-20 NOTE — Progress Notes (Signed)
eLink Physician-Brief Progress Note Patient Name: [name] DOB: August 08, 1940 MRN: [id number]   Date of Service  06/20/2019  HPI/Events of Note  Patient c/o "gas pain"  eICU Interventions  Will order: 1. Simethicone 80 mg PO Q 6 hours PRN gas pain, bloating or flatulence.     Intervention Category Major Interventions: Other:  Lysle Dingwall 06/20/2019, 10:01 PM

## 2019-06-21 ENCOUNTER — Inpatient Hospital Stay (HOSPITAL_COMMUNITY): Payer: Medicare Other

## 2019-06-21 DIAGNOSIS — R609 Edema, unspecified: Secondary | ICD-10-CM

## 2019-06-21 DIAGNOSIS — K921 Melena: Secondary | ICD-10-CM

## 2019-06-21 DIAGNOSIS — R791 Abnormal coagulation profile: Secondary | ICD-10-CM

## 2019-06-21 LAB — CBC
HCT: 18.4 % — ABNORMAL LOW (ref 36.0–46.0)
Hemoglobin: 5.7 g/dL — CL (ref 12.0–15.0)
MCH: 28.9 pg (ref 26.0–34.0)
MCHC: 31 g/dL (ref 30.0–36.0)
MCV: 93.4 fL (ref 80.0–100.0)
Platelets: 96 10*3/uL — ABNORMAL LOW (ref 150–400)
RBC: 1.97 MIL/uL — ABNORMAL LOW (ref 3.87–5.11)
RDW: 16.2 % — ABNORMAL HIGH (ref 11.5–15.5)
WBC: 8.8 10*3/uL (ref 4.0–10.5)
nRBC: 0 % (ref 0.0–0.2)

## 2019-06-21 LAB — MAGNESIUM: Magnesium: 1.6 mg/dL — ABNORMAL LOW (ref 1.7–2.4)

## 2019-06-21 LAB — BASIC METABOLIC PANEL
Anion gap: 9 (ref 5–15)
BUN: 39 mg/dL — ABNORMAL HIGH (ref 8–23)
CO2: 28 mmol/L (ref 22–32)
Calcium: 8 mg/dL — ABNORMAL LOW (ref 8.9–10.3)
Chloride: 100 mmol/L (ref 98–111)
Creatinine, Ser: 2.56 mg/dL — ABNORMAL HIGH (ref 0.44–1.00)
GFR calc Af Amer: 20 mL/min — ABNORMAL LOW (ref >60)
GFR calc non Af Amer: 18 mL/min — ABNORMAL LOW (ref >60)
Glucose, Bld: 151 mg/dL — ABNORMAL HIGH (ref 70–99)
Potassium: 3.7 mmol/L (ref 3.5–5.1)
Sodium: 137 mmol/L (ref 135–145)

## 2019-06-21 LAB — PROTIME-INR
INR: 1.6 — ABNORMAL HIGH (ref 0.8–1.2)
Prothrombin Time: 18.6 seconds — ABNORMAL HIGH (ref 11.4–15.2)

## 2019-06-21 LAB — HEMOGLOBIN AND HEMATOCRIT, BLOOD
HCT: 20.1 % — ABNORMAL LOW (ref 36.0–46.0)
HCT: 20.2 % — ABNORMAL LOW (ref 36.0–46.0)
HCT: 20.9 % — ABNORMAL LOW (ref 36.0–46.0)
Hemoglobin: 6.3 g/dL — CL (ref 12.0–15.0)
Hemoglobin: 6.2 g/dL — CL (ref 12.0–15.0)
Hemoglobin: 6.4 g/dL — CL (ref 12.0–15.0)

## 2019-06-21 LAB — PHOSPHORUS: Phosphorus: 3.7 mg/dL (ref 2.5–4.6)

## 2019-06-21 MED ORDER — LEVOFLOXACIN 500 MG PO TABS: 500.0000 mg | ORAL_TABLET | Freq: Every day | ORAL | Status: DC

## 2019-06-21 MED ORDER — METRONIDAZOLE 500 MG PO TABS
500.0000 mg | ORAL_TABLET | Freq: Three times a day (TID) | ORAL | Status: DC
  Administered 2019-06-21 – 2019-06-23 (×6): 500 mg via ORAL
  Filled 2019-06-21 (×7): qty 1

## 2019-06-21 MED ORDER — GABAPENTIN 100 MG PO CAPS
200.0000 mg | ORAL_CAPSULE | Freq: Every day | ORAL | Status: DC
  Administered 2019-06-21 – 2019-06-22 (×2): 200 mg via ORAL
  Filled 2019-06-21 (×2): qty 2

## 2019-06-21 MED ORDER — LEVOFLOXACIN 500 MG PO TABS: 500.0000 mg | ORAL_TABLET | ORAL | Status: DC

## 2019-06-21 MED ORDER — METOPROLOL TARTRATE 25 MG PO TABS
50.0000 mg | ORAL_TABLET | Freq: Two times a day (BID) | ORAL | Status: DC
  Administered 2019-06-21 – 2019-06-23 (×3): 50 mg via ORAL
  Filled 2019-06-21 (×5): qty 2

## 2019-06-21 MED ORDER — PANTOPRAZOLE SODIUM 40 MG PO TBEC
40.0000 mg | DELAYED_RELEASE_TABLET | Freq: Every day | ORAL | Status: DC
  Administered 2019-06-21 – 2019-06-23 (×3): 40 mg via ORAL
  Filled 2019-06-21 (×3): qty 1

## 2019-06-21 NOTE — Progress Notes (Addendum)
Pharmacy Antibiotic Note  [name] is a 79 y.o. female admitted on 06/18/2019 with lower GI bleed.  PMH significant for s/p renal transplant recipient, CKD stage IV, AFib (on warfarin).  Pharmacy has been consulted for Levaquin dosing for intra-abdominal infection.  Patient with a noted Cipro allergy; however Epic records show patient has received Levaquin in the past. 06/21/2019  Full day abx #3 Lvq/flagyl. WBC WNL. SCr 2.56, CrCl 17 ml/min. AF  Plan: Levaquin 757m IV x 1 given 2/9 at 1857 dose followed by 5036mIV q48h given 2/11 at 1806 Changed to PO 2/12 Levaquin 500 mg PO q48, next dose 2/13 at 1800 Flagyl 500 po tid Pharmacy to sign off  Height: 5' 3"  (160 cm) Weight: 143 lb 11.8 oz (65.2 kg) IBW/kg (Calculated) : 52.4  Temp (24hrs), Avg:98.5 F (36.9 C), Min:97.7 F (36.5 C), Max:98.9 F (37.2 C)  Recent Labs  Lab 06/18/19 1219 06/18/19 1219 06/18/19 1500 06/18/19 1709 06/18/19 1709 06/19/19 0211 06/19/19 0551 06/20/19 0533 06/20/19 1410 06/21/19 0230  WBC 12.5*  --  21.3* 18.3*  --  7.1  --   --   --  8.8  CREATININE 2.16*   < >  --  2.43*   < > 2.48* 2.51* 2.62* 2.51* 2.56*   < > = values in this interval not displayed.    Estimated Creatinine Clearance: 17 mL/min (A) (by C-G formula based on SCr of 2.56 mg/dL (H)).    Allergies  Allergen Reactions  . Amoxicillin Anaphylaxis  . Ampicillin Anaphylaxis  . Ciprofloxacin Swelling and Other (See Comments)    "of my throat"  . Erythromycin Anaphylaxis  . Penicillins Anaphylaxis and Other (See Comments)    Has patient had a PCN reaction causing immediate rash, facial/tongue/throat swelling, SOB or lightheadedness with hypotension: Yes Has patient had a PCN reaction causing severe rash involving mucus membranes or skin necrosis: No Has patient had a PCN reaction that required hospitalization: Yes Has patient had a PCN reaction occurring within the last 10 years: Yes If all of the above answers are "NO", then  may proceed with Cephalosporin use.  . Tetracycline Anaphylaxis  . Labetalol Nausea And Vomiting  . Sulfa Antibiotics Itching  . Sulfamethoxazole-Trimethoprim Itching   Antimicrobials this admission:  2/9 LVQ>> 2/9 Flagyl>> Dose adjustments this admission:   Microbiology results:  2/9 MRSA PCR: neg 2/9 Covid/Flu: neg   Thank you for allowing pharmacy to be a part of this patient's care.  MiEudelia BunchPharm.D [phone] 06/21/2019 10:22 AM

## 2019-06-21 NOTE — Progress Notes (Signed)
Right upper extremity venous duplex has been completed. Preliminary results can be found in CV Proc through chart review.  Results were given to the patient's nurse, Mel Almond.  06/21/19 9:36 AM [name] RVT

## 2019-06-21 NOTE — Progress Notes (Signed)
RN went into room after pt getting of the phone. Pt was in the chair and saying "I dont feel too well". Pt HR dropped to 30, CCM NP was outside of room and called to bedside. Pt did not lose a pulse, but had decreased responsiveness. RN was about to give atropine at bedside, but HR then jumped to 150s. BP remained stable during this time. Pt also had a large bloody BM after HR came up to the 150s. HR then returned to 90s. Lopressor held dt bradycardia event. After pt stabilized, pt moved back to bed. MD made aware

## 2019-06-21 NOTE — Progress Notes (Signed)
Hemoglobin has leveled off over the past 24 hours, currently 6.3.  Unfortunately, the patient now has evidence of a right upper extremity DVT.  The patient indicates that she has minimal blood with her bowel movements, per observation of the nurse.  Impression:   1.  Quiescent GI bleed, most likely lower tract origin, most likely diverticular, which occurred while on anticoagulation for atrial fibrillation.    2.  Posthemorrhagic anemia, severe, in a Jehovah's Witness who declines blood transfusions  3.  New brachial vein DVT involving the right arm with remote history of previous DVT, I believe in the lower extremities  4.  Complicated past medical history, as detailed in my initial consultation note  Discussion: At this point, the patient appears to be "out of the woods" with respect to the likelihood of spontaneous recurrence of GI bleeding.  That is, there is very little likelihood of a rebleed on this admission, in view of the amount of time elapsed since she last bled (now 3 days).  However, I think if she were to be restarted on anticoagulation at this time (for example, heparin for her DVT), there is a moderately high risk that bleeding would occur.  Recommendations:  1.  GI will sign off, but would be happy to see patient again at your request  2.  I advised the patient that, whenever she goes home, she should arrange early follow-up (within a week or so after discharge) with a physician of her choosing to check Hemoccult status and a hemoglobin level.  She can do this at our office if desired, although if she is seeing other providers, such as her cardiologist or nephrologist, and they are willing to do such testing, there is no specific need for her to follow-up in our office.  3.  Other recommendations, specifically with respect to long-term management, are outlined in my note from yesterday (please review).  Please call us if you have any questions.  Cleotis Nipper,  M.D. Pager [phone] If no answer or after 5 PM call [phone]

## 2019-06-21 NOTE — Progress Notes (Signed)
PROGRESS NOTE    [name]  QJJ:[id number] DOB: 1940-12-27 DOA: 06/18/2019 PCP: Ann Held, DO   Brief Narrative: 79 year old female transferred from [facility] to [place] [facility] for rectal bleeding.  She was admitted 06/18/2019.  She has history of renal transplant with CKD stage IV she is Jehovah witness was on Coumadin for paroxysmal atrial fibrillation and DVT with an INR of 4.3 at the time of admission.She had similar episode of lower GI bleeding in 2019 when her hemoglobin was 1.9.  Assessment & Plan:   Active Problems:   Acute GI bleeding   Acute lower GI bleeding   Lower GI bleeding   1 severe anemia secondary to lower GI bleed likely from diverticular bleed.  Patient has not received any blood transfusion as she is Jehovah witness. She reported she gets Procrit every 3 weeks by her nephrologist. Tagged RBC scan with no evidence of active bleeding. Trend H&H. She is okay with getting IV iron PT consult. Patient is off of Coumadin. Hemoglobin today 6.3 up from 5.7 has been staying around upper fives to low 6.  INR 1.6.  2 chronic atrial fibrillation on Coumadin prior to admission to hospital with INR of 4.3.  Review of notes indicates that we are holding off on putting her back on Coumadin due to the fact that patient's bleeding cannot be identified or corrected.  Her DVT was very long time ago she does not appear to be needing Coumadin for the DVT purpose.  However she has chronic atrial fibrillation and had stopped the Coumadin due to severe GI bleed with low hemoglobin as low as 5.9 with inability to give her blood transfusion due to her being Jehovah witness.  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  She is followed at [facility] for the transplant and follows up with Dr. Archie Balboa On bicarb drip Monitor renal functions daily creatinine 2.56 from 2.51 from 2.62. On tacrolimus and  mycophenolate.  4 history of obstructive sleep apnea patient has a CPAP at home that she uses every night comfortably.  However she was not able to use our CPAP last night as it was causing her more distress.  5 history of hypothyroidism continue Synthroid.  6 hypomagnesemia replete cautiously with CKD.  7 history of essential hypertension restart metoprolol.  8 right upper extremity edema check venous Doppler to rule out DVT  Addendum- RUE acute dvt.will hold off on coumadin at this time...  Estimated body mass index is 25.46 kg/m as calculated from the following:   Height as of this encounter: 5' 3"  (1.6 m).   Weight as of this encounter: 65.2 kg.  DVT prophylaxis: SCD due to GI bleed and being Jehovah witness  Code Status: Full code  family Communication: None  disposition Plan: Patient came from home and plans to return home.  PT eval pending.  Barriers to discharge patient still being monitored for GI bleed and need clearance from GI prior to discharge.   Consultants:   PCCM and GI  Procedures: None Antimicrobials: Levofloxacin  Subjective: Patient resting in bed awake alert answers all my questions appropriately was not able to use CPAP comfortably last night did not sleep well Continues to see some black stools but decreasing  Objective: Vitals:   06/21/19 0400 06/21/19 0500 06/21/19 0800 06/21/19 0900  BP: (!) 128/44   (!) 162/83  Pulse: 92   89  Resp: 13  19  Temp: 97.7 F (36.5 C)  98.7 F (37.1 C)   TempSrc: Oral  Oral   SpO2: 98%   97%  Weight:  65.2 kg    Height:        Intake/Output Summary (Last 24 hours) at 06/21/2019 0926 Last data filed at 06/21/2019 0900 Gross per 24 hour  Intake 3223.5 ml  Output 602 ml  Net 2621.5 ml   Filed Weights   06/19/19 0500 06/20/19 0430 06/21/19 0500  Weight: 62.2 kg 64.1 kg 65.2 kg    Examination:  General exam: Appears calm and comfortable  Respiratory system: Clear to auscultation. Respiratory effort  normal. Cardiovascular system: S1 & S2 heard, RRR. No JVD, murmurs, rubs, gallops or clicks. No pedal edema. Gastrointestinal system: Abdomen is nondistended, soft and nontender. No organomegaly or masses felt. Normal bowel sounds heard. Central nervous system: Alert and oriented. No focal neurological deficits. Extremities: 2+ right upper extremity edema Skin: No rashes, lesions or ulcers Psychiatry: Judgement and insight appear normal. Mood & affect appropriate.     Data Reviewed: I have personally reviewed following labs and imaging studies  CBC: Recent Labs  Lab 06/18/19 1219 06/18/19 1219 06/18/19 1500 06/18/19 1500 06/18/19 1709 06/18/19 2305 06/19/19 0211 06/19/19 0759 06/20/19 0853 06/20/19 1410 06/20/19 2037 06/21/19 0230 06/21/19 0815  WBC 12.5*  --  21.3*  --  18.3*  --  7.1  --   --   --   --  8.8  --   NEUTROABS 8.2*  --  16.4*  --  12.4*  --   --   --   --   --   --   --   --   HGB 11.4*   < > 9.0*   < > 7.9*   < > 9.1*   < > 5.9* 6.1* 6.1* 5.7* 6.3*  HCT 38.0   < > 29.8*   < > 27.0*   < > 30.8*   < > 19.1* 21.4* 19.4* 18.4* 20.1*  MCV 95.7  --  94.9  --  97.8  --  95.7  --   --   --   --  93.4  --   PLT 185  --  125*  --  149*  --  124*  --   --   --   --  96*  --    < > = values in this interval not displayed.   Basic Metabolic Panel: Recent Labs  Lab 06/19/19 0211 06/19/19 0211 06/19/19 0551 06/19/19 0759 06/19/19 1052 06/19/19 1212 06/20/19 0533 06/20/19 1410 06/21/19 0230  NA 138  --  138  --   --   --  139 137 137  K 6.0*   < > 5.9*   < > 5.0 5.4* 3.8 4.1 3.7  CL 111  --  113*  --   --   --  105 103 100  CO2 20*  --  18*  --   --   --  26 26 28   GLUCOSE 180*  --  116*  --   --   --  118* 152* 151*  BUN 56*  --  57*  --   --   --  52* 43* 39*  CREATININE 2.48*  --  2.51*  --   --   --  2.62* 2.51* 2.56*  CALCIUM 7.9*  --  7.7*  --   --   --  8.2* 8.3* 8.0*  MG  --   --   --   --   --   --   --   --  1.6*  PHOS  --   --   --   --   --   --    --   --  3.7   < > = values in this interval not displayed.   GFR: Estimated Creatinine Clearance: 17 mL/min (A) (by C-G formula based on SCr of 2.56 mg/dL (H)). Liver Function Tests: Recent Labs  Lab 06/18/19 1219 06/18/19 1709  AST 18 16  ALT 12 11  ALKPHOS 122 89  BILITOT 0.7 0.4  PROT 7.0 5.3*  ALBUMIN 4.0 2.9*   No results for input(s): LIPASE, AMYLASE in the last 168 hours. No results for input(s): AMMONIA in the last 168 hours. Coagulation Profile: Recent Labs  Lab 06/18/19 1219 06/18/19 1709 06/19/19 1052 06/21/19 0230  INR 4.3* 3.1* 1.5* 1.6*   Cardiac Enzymes: No results for input(s): CKTOTAL, CKMB, CKMBINDEX, TROPONINI in the last 168 hours. BNP (last 3 results) No results for input(s): PROBNP in the last 8760 hours. HbA1C: No results for input(s): HGBA1C in the last 72 hours. CBG: No results for input(s): GLUCAP in the last 168 hours. Lipid Profile: No results for input(s): CHOL, HDL, LDLCALC, TRIG, CHOLHDL, LDLDIRECT in the last 72 hours. Thyroid Function Tests: No results for input(s): TSH, T4TOTAL, FREET4, T3FREE, THYROIDAB in the last 72 hours. Anemia Panel: No results for input(s): VITAMINB12, FOLATE, FERRITIN, TIBC, IRON, RETICCTPCT in the last 72 hours. Sepsis Labs: No results for input(s): PROCALCITON, LATICACIDVEN in the last 168 hours.  Recent Results (from the past 240 hour(s))  Respiratory Panel by RT PCR (Flu A&B, Covid) - Nasopharyngeal Swab     Status: None   Collection Time: 06/18/19 12:51 PM   Specimen: Nasopharyngeal Swab  Result Value Ref Range Status   SARS Coronavirus 2 by RT PCR NEGATIVE NEGATIVE Final    Comment: (NOTE) SARS-CoV-2 target nucleic acids are NOT DETECTED. The SARS-CoV-2 RNA is generally detectable in upper respiratoy specimens during the acute phase of infection. The lowest concentration of SARS-CoV-2 viral copies this assay can detect is 131 copies/mL. A negative result does not preclude SARS-Cov-2 infection  and should not be used as the sole basis for treatment or other patient management decisions. A negative result may occur with  improper specimen collection/handling, submission of specimen other than nasopharyngeal swab, presence of viral mutation(s) within the areas targeted by this assay, and inadequate number of viral copies (<131 copies/mL). A negative result must be combined with clinical observations, patient history, and epidemiological information. The expected result is Negative. Fact Sheet for Patients:  PinkCheek.be Fact Sheet for Healthcare Providers:  GravelBags.it This test is not yet ap proved or cleared by the [place] FDA and  has been authorized for detection and/or diagnosis of SARS-CoV-2 by FDA under an Emergency Use Authorization (EUA). This EUA will remain  in effect (meaning this test can be used) for the duration of the COVID-19 declaration under Section 564(b)(1) of the Act, 21 U.S.C. section 360bbb-3(b)(1), unless the authorization is terminated or revoked sooner.    Influenza A by PCR NEGATIVE NEGATIVE Final   Influenza B by PCR NEGATIVE NEGATIVE Final    Comment: (NOTE) The Xpert Xpress SARS-CoV-2/FLU/RSV assay is intended as an aid in  the diagnosis of influenza from Nasopharyngeal swab specimens and  should not be used as a sole basis for treatment. Nasal washings and  aspirates are unacceptable for Xpert Xpress SARS-CoV-2/FLU/RSV  testing. Fact Sheet for Patients: PinkCheek.be Fact Sheet for Healthcare Providers: GravelBags.it This test is not yet  approved or cleared by the [place] and  has been authorized for detection and/or diagnosis of SARS-CoV-2 by  FDA under an Emergency Use Authorization (EUA). This EUA will remain  in effect (meaning this test can be used) for the duration of the  Covid-19 declaration under Section  564(b)(1) of the Act, 21  U.S.C. section 360bbb-3(b)(1), unless the authorization is  terminated or revoked. Performed at [facility], [place] [address], [place], [place] [address]   MRSA PCR Screening     Status: None   Collection Time: 06/18/19 10:48 PM   Specimen: Nasopharyngeal  Result Value Ref Range Status   MRSA by PCR NEGATIVE NEGATIVE Final    Comment:        The GeneXpert MRSA Assay (FDA approved for NASAL specimens only), is one component of a comprehensive MRSA colonization surveillance program. It is not intended to diagnose MRSA infection nor to guide or monitor treatment for MRSA infections. Performed at [facility], [place] [address] [place], [place] [address]          Radiology Studies: No results found.      Scheduled Meds: . Chlorhexidine Gluconate Cloth  6 each Topical Daily  . hydrocortisone sod succinate (SOLU-CORTEF) inj  50 mg Intravenous Q12H  . levothyroxine  100 mcg Oral Q0600  . mouth rinse  15 mL Mouth Rinse BID  . mycophenolate  360 mg Oral BID  . pantoprazole (PROTONIX) IV  40 mg Intravenous Q12H  . tacrolimus  3 mg Oral BID   Continuous Infusions: . sodium chloride Stopped (06/18/19 2338)  . levofloxacin (LEVAQUIN) IV Stopped (06/20/19 1906)  . metronidazole 500 mg (06/21/19 0515)  .  sodium bicarbonate  infusion 1000 mL 75 mL/hr at 06/21/19 0900     LOS: 3 days     Georgette Shell, MD 06/21/2019, 9:26 AM

## 2019-06-21 NOTE — Progress Notes (Signed)
PT Cancellation Note  Patient Details Name: ZAYRA DEVITO MRN: [id number] DOB: 04-15-1941   Cancelled Treatment:    Reason Eval/Treat Not Completed: Medical issues which prohibited therapy,  New diagnosis DVT RUE. Will check back another day.   Claretha Cooper 06/21/2019, 11:51 AM  [facility]  Office [phone]

## 2019-06-22 LAB — CBC
HCT: 18.9 % — ABNORMAL LOW (ref 36.0–46.0)
Hemoglobin: 5.8 g/dL — CL (ref 12.0–15.0)
MCH: 28.7 pg (ref 26.0–34.0)
MCHC: 30.7 g/dL (ref 30.0–36.0)
MCV: 93.6 fL (ref 80.0–100.0)
Platelets: 120 10*3/uL — ABNORMAL LOW (ref 150–400)
RBC: 2.02 MIL/uL — ABNORMAL LOW (ref 3.87–5.11)
RDW: 16 % — ABNORMAL HIGH (ref 11.5–15.5)
WBC: 8.5 10*3/uL (ref 4.0–10.5)
nRBC: 0.2 % (ref 0.0–0.2)

## 2019-06-22 LAB — HEMOGLOBIN AND HEMATOCRIT, BLOOD
HCT: 24.1 % — ABNORMAL LOW (ref 36.0–46.0)
HCT: 23 % — ABNORMAL LOW (ref 36.0–46.0)
HCT: 21.1 % — ABNORMAL LOW (ref 36.0–46.0)
Hemoglobin: 7.4 g/dL — ABNORMAL LOW (ref 12.0–15.0)
Hemoglobin: 6.8 g/dL — CL (ref 12.0–15.0)
Hemoglobin: 6.3 g/dL — CL (ref 12.0–15.0)

## 2019-06-22 LAB — BASIC METABOLIC PANEL
Anion gap: 11 (ref 5–15)
BUN: 33 mg/dL — ABNORMAL HIGH (ref 8–23)
CO2: 25 mmol/L (ref 22–32)
Calcium: 8.2 mg/dL — ABNORMAL LOW (ref 8.9–10.3)
Chloride: 103 mmol/L (ref 98–111)
Creatinine, Ser: 2.3 mg/dL — ABNORMAL HIGH (ref 0.44–1.00)
GFR calc Af Amer: 23 mL/min — ABNORMAL LOW (ref >60)
GFR calc non Af Amer: 20 mL/min — ABNORMAL LOW (ref >60)
Glucose, Bld: 139 mg/dL — ABNORMAL HIGH (ref 70–99)
Potassium: 3.8 mmol/L (ref 3.5–5.1)
Sodium: 139 mmol/L (ref 135–145)

## 2019-06-22 MED ORDER — SODIUM CHLORIDE 0.9 % IV SOLN
510.0000 mg | Freq: Once | INTRAVENOUS | Status: AC
  Administered 2019-06-22: 510 mg via INTRAVENOUS
  Filled 2019-06-22: qty 17

## 2019-06-22 MED ORDER — DIPHENHYDRAMINE HCL 25 MG PO CAPS
ORAL_CAPSULE | ORAL | Status: AC
  Administered 2019-06-22: 25 mg
  Filled 2019-06-22: qty 1

## 2019-06-22 MED ORDER — DIPHENHYDRAMINE HCL 25 MG PO CAPS: 25.0000 mg | ORAL_CAPSULE | Freq: Four times a day (QID) | ORAL | Status: DC | PRN

## 2019-06-22 NOTE — Evaluation (Signed)
Physical Therapy Evaluation Patient Details Name: [name] MRN: [id number] DOB: 01/20/1941 Today's Date: 06/22/2019   History of Present Illness  79 year old female transferred from [facility] for rectal bleeding on 06/18/2019.  PMH:of renal transplant with CKD stage IV, HGB 6.3, she is Jehovah witness .Patient was on Coumadin for paroxysmal atrial fibrillation and DVT with an INR of 4.3 at the time of admission.Marland Kitchen Positive DVT in RUE,.  Clinical Impression  The patient expresses feeling weak when ambulated a short distance. Patient's HR 104, 98%, BP 120/50(by RN). The patient should progress to Dc home. Continue progressive activity. Pt admitted with above diagnosis.  Pt currently with functional limitations due to the deficits listed below (see PT Problem List). Pt will benefit from skilled PT to increase their independence and safety with mobility to allow discharge to the venue listed below.     Follow Up Recommendations No PT follow up    Equipment Recommendations  None recommended by PT    Recommendations for Other Services OT consult     Precautions / Restrictions Precautions Precautions: Fall Precaution Comments: HGB 5 today      Mobility  Bed Mobility Overal bed mobility: Independent                Transfers Overall transfer level: Needs assistance Equipment used: None Transfers: Sit to/from Stand Sit to Stand: Supervision            Ambulation/Gait Ambulation/Gait assistance: Min guard;Min assist Gait Distance (Feet): 20 Feet Assistive device: (holding onto foot board and rail as she ambulated around the bed) Gait Pattern/deviations: Step-through pattern     General Gait Details: gait is very slow  Science writer    Modified Rankin (Stroke Patients Only)       Balance Overall balance assessment: Needs assistance Sitting-balance support: No upper extremity supported;Feet supported Sitting  balance-Leahy Scale: Good     Standing balance support: During functional activity;No upper extremity supported Standing balance-Leahy Scale: Fair                               Pertinent Vitals/Pain Pain Assessment: No/denies pain    Home Living Family/patient expects to be discharged to:: Private residence Living Arrangements: Spouse/significant other Available Help at Discharge: Family Type of Home: House Home Access: Stairs to enter     Home Layout: Multi-level;Bed/bath upstairs Home Equipment: Kasandra Knudsen - single point      Prior Function Level of Independence: Independent               Hand Dominance   Dominant Hand: Right    Extremity/Trunk Assessment   Upper Extremity Assessment Upper Extremity Assessment: Defer to OT evaluation(edema noted)    Lower Extremity Assessment Lower Extremity Assessment: Generalized weakness    Cervical / Trunk Assessment Cervical / Trunk Assessment: Normal  Communication   Communication: No difficulties  Cognition Arousal/Alertness: Awake/alert Behavior During Therapy: WFL for tasks assessed/performed                                          General Comments      Exercises     Assessment/Plan    PT Assessment Patient needs continued PT services  PT Problem List Decreased strength;Decreased mobility;Decreased safety awareness;Decreased knowledge  of precautions;Decreased activity tolerance;Decreased balance       PT Treatment Interventions Gait training;DME instruction;Functional mobility training;Stair training;Therapeutic exercise;Therapeutic activities;Patient/family education    PT Goals (Current goals can be found in the Care Plan section)  Acute Rehab PT Goals Patient Stated Goal: to go home PT Goal Formulation: With patient Time For Goal Achievement: 07/06/19    Frequency Min 3X/week   Barriers to discharge        Co-evaluation               AM-PAC PT "6 Clicks"  Mobility  Outcome Measure Help needed turning from your back to your side while in a flat bed without using bedrails?: None Help needed moving from lying on your back to sitting on the side of a flat bed without using bedrails?: None Help needed moving to and from a bed to a chair (including a wheelchair)?: A Little Help needed standing up from a chair using your arms (e.g., wheelchair or bedside chair)?: A Little Help needed to walk in hospital room?: A Little Help needed climbing 3-5 steps with a railing? : A Lot 6 Click Score: 19    End of Session   Activity Tolerance: Patient limited by fatigue Patient left: in chair;with call bell/phone within reach;with nursing/sitter in room Nurse Communication: Mobility status PT Visit Diagnosis: Unsteadiness on feet (R26.81);Difficulty in walking, not elsewhere classified (R26.2)    Time: 3151-7616 PT Time Calculation (min) (ACUTE ONLY): 19 min   Charges:   PT Evaluation $PT Eval Low Complexity: [facility] PT Acute Rehabilitation Services Pager [phone] Office [phone]  Claretha Cooper 06/22/2019, 9:29 AM

## 2019-06-22 NOTE — Progress Notes (Signed)
PROGRESS NOTE    [name]  NKN:[id number] DOB: Feb 09, 1941 DOA: 06/18/2019 PCP: Ann Held, DO  Brief Narrative: 79 year old female transferred from [facility] to [facility] for rectal bleeding.  She was admitted 06/18/2019.  She has history of renal transplant with CKD stage IV she is Jehovah witness was on Coumadin for paroxysmal atrial fibrillation and DVT with an INR of 4.3 at the time of admission.She had similar episode of lower GI bleeding in 2019 when her hemoglobin was 1.9.  PCCM pick up 2/12  2/13 -awake alert.  Was very concerned and became bradycardic after hearing that she has a blood clot in the right upper extremity. Agreeable for iron transfusion today. Has residual black stools Denies chest pain shortness of breath nausea vomiting  Assessment & Plan:   Active Problems:   Acute GI bleeding   Acute lower GI bleeding   Lower GI bleeding   Hematochezia   Elevated INR  1 severe anemia secondary to lower GI bleed likely from diverticular bleed.  Patient has not received any blood transfusion as she is Jehovah witness. She reported she gets Procrit every 3 weeks by her nephrologist. Tagged RBC scan with no evidence of active bleeding. Trend H&H. We will transfuse her Feraheme today She is okay with getting IV iron Patient is off of Coumadin. Hemoglobin today 5.8 from 6.4 6.3 up from 5.7 has been staying around upper fives to low 6.  INR 1.6.  2 chronic atrial fibrillation on Coumadin prior to admission to hospital with INR of 4.3.  Review of notes indicates that we are holding off on putting her back on Coumadin due to the fact that patient's bleeding cannot be identified or corrected.  Her DVT was very long time ago she does not appear to be needing Coumadin for the DVT purpose.  However she has chronic atrial fibrillation and had stopped the Coumadin due to severe GI bleed with low hemoglobin as low as 5.9 with inability to give her blood transfusion due  to her being Jehovah witness.  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  She is followed at [facility] for the transplant and follows up with Dr. Archie Balboa  bicarb drip stopped 06/21/2019 Monitor renal functions daily creatinine 2.30 from 2.56 from 2.51 from 2.62. On tacrolimus and mycophenolate.  4 history of obstructive sleep apnea patient has a CPAP at home that she uses every night comfortably.  However she was not able to use our CPAP last night as it was causing her more distress.  5 history of hypothyroidism continue Synthroid.  6 hypomagnesemia replete cautiously with CKD.  7 history of essential hypertension restart metoprolol.  8 right upper extremity edema doppler with acute DVT   Estimated body mass index is 25.19 kg/m as calculated from the following:   Height as of this encounter: 5' 3"  (1.6 m).   Weight as of this encounter: 64.5 kg.  DVT prophylaxis: SCD due to GI bleed and being Jehovah witness  Code Status: Full code  family Communication: None  disposition Plan: Patient came from home and plans to return home.  PT eval pending.  Barriers to discharge patient still being monitored for GI bleed and need clearance from GI prior to discharge.   Consultants:   PCCM and GI  Procedures: None Antimicrobials: Levofloxacin Subjective:  No nausea vomiting chest pain sob Objective: Vitals:   06/22/19 0700 06/22/19 0800 06/22/19  0818 06/22/19 0926  BP: (!) 166/76  120/61 (!) 120/50  Pulse: 90 (!) 101 99 98  Resp: 13 20 13    Temp:  97.8 F (36.6 C)    TempSrc:  Oral    SpO2: 97% 95% 91%   Weight:      Height:        Intake/Output Summary (Last 24 hours) at 06/22/2019 0940 Last data filed at 06/22/2019 0800 Gross per 24 hour  Intake 291.44 ml  Output 300 ml  Net -8.56 ml   Filed Weights   06/20/19 0430 06/21/19 0500 06/22/19 0428  Weight: 64.1 kg 65.2 kg 64.5 kg     Examination:  General exam: Appears calm and comfortable  Respiratory system: Clear to auscultation. Respiratory effort normal. Cardiovascular system: S1 & S2 heard, RRR. No JVD, murmurs, rubs, gallops or clicks. No pedal edema. Gastrointestinal system: Abdomen is nondistended, soft and nontender. No organomegaly or masses felt. Normal bowel sounds heard. Central nervous system: Alert and oriented. No focal neurological deficits. Extremities: Symmetric 5 x 5 power. Skin: No rashes, lesions or ulcers Psychiatry: Judgement and insight appear normal. Mood & affect appropriate.     Data Reviewed: I have personally reviewed following labs and imaging studies  CBC: Recent Labs  Lab 06/18/19 1219 06/18/19 1219 06/18/19 1500 06/18/19 1500 06/18/19 1709 06/18/19 2305 06/19/19 0211 06/19/19 0759 06/21/19 0230 06/21/19 0815 06/21/19 1433 06/21/19 1934 06/22/19 0209  WBC 12.5*   < > 21.3*  --  18.3*  --  7.1  --  8.8  --   --   --  8.5  NEUTROABS 8.2*  --  16.4*  --  12.4*  --   --   --   --   --   --   --   --   HGB 11.4*   < > 9.0*   < > 7.9*   < > 9.1*   < > 5.7* 6.3* 6.2* 6.4* 5.8*  HCT 38.0   < > 29.8*   < > 27.0*   < > 30.8*   < > 18.4* 20.1* 20.2* 20.9* 18.9*  MCV 95.7   < > 94.9  --  97.8  --  95.7  --  93.4  --   --   --  93.6  PLT 185   < > 125*  --  149*  --  124*  --  96*  --   --   --  120*   < > = values in this interval not displayed.   Basic Metabolic Panel: Recent Labs  Lab 06/19/19 0551 06/19/19 0759 06/19/19 1212 06/20/19 0533 06/20/19 1410 06/21/19 0230 06/22/19 0209  NA 138  --   --  139 137 137 139  K 5.9*   < > 5.4* 3.8 4.1 3.7 3.8  CL 113*  --   --  105 103 100 103  CO2 18*  --   --  26 26 28 25   GLUCOSE 116*  --   --  118* 152* 151* 139*  BUN 57*  --   --  52* 43* 39* 33*  CREATININE 2.51*  --   --  2.62* 2.51* 2.56* 2.30*  CALCIUM 7.7*  --   --  8.2* 8.3* 8.0* 8.2*  MG  --   --   --   --   --  1.6*  --   PHOS  --   --   --   --   --  3.7   --    < > =  values in this interval not displayed.   GFR: Estimated Creatinine Clearance: 18.8 mL/min (A) (by C-G formula based on SCr of 2.3 mg/dL (H)). Liver Function Tests: Recent Labs  Lab 06/18/19 1219 06/18/19 1709  AST 18 16  ALT 12 11  ALKPHOS 122 89  BILITOT 0.7 0.4  PROT 7.0 5.3*  ALBUMIN 4.0 2.9*   No results for input(s): LIPASE, AMYLASE in the last 168 hours. No results for input(s): AMMONIA in the last 168 hours. Coagulation Profile: Recent Labs  Lab 06/18/19 1219 06/18/19 1709 06/19/19 1052 06/21/19 0230  INR 4.3* 3.1* 1.5* 1.6*   Cardiac Enzymes: No results for input(s): CKTOTAL, CKMB, CKMBINDEX, TROPONINI in the last 168 hours. BNP (last 3 results) No results for input(s): PROBNP in the last 8760 hours. HbA1C: No results for input(s): HGBA1C in the last 72 hours. CBG: No results for input(s): GLUCAP in the last 168 hours. Lipid Profile: No results for input(s): CHOL, HDL, LDLCALC, TRIG, CHOLHDL, LDLDIRECT in the last 72 hours. Thyroid Function Tests: No results for input(s): TSH, T4TOTAL, FREET4, T3FREE, THYROIDAB in the last 72 hours. Anemia Panel: No results for input(s): VITAMINB12, FOLATE, FERRITIN, TIBC, IRON, RETICCTPCT in the last 72 hours. Sepsis Labs: No results for input(s): PROCALCITON, LATICACIDVEN in the last 168 hours.  Recent Results (from the past 240 hour(s))  Respiratory Panel by RT PCR (Flu A&B, Covid) - Nasopharyngeal Swab     Status: None   Collection Time: 06/18/19 12:51 PM   Specimen: Nasopharyngeal Swab  Result Value Ref Range Status   SARS Coronavirus 2 by RT PCR NEGATIVE NEGATIVE Final    Comment: (NOTE) SARS-CoV-2 target nucleic acids are NOT DETECTED. The SARS-CoV-2 RNA is generally detectable in upper respiratoy specimens during the acute phase of infection. The lowest concentration of SARS-CoV-2 viral copies this assay can detect is 131 copies/mL. A negative result does not preclude SARS-Cov-2 infection and should  not be used as the sole basis for treatment or other patient management decisions. A negative result may occur with  improper specimen collection/handling, submission of specimen other than nasopharyngeal swab, presence of viral mutation(s) within the areas targeted by this assay, and inadequate number of viral copies (<131 copies/mL). A negative result must be combined with clinical observations, patient history, and epidemiological information. The expected result is Negative. Fact Sheet for Patients:  PinkCheek.be Fact Sheet for Healthcare Providers:  GravelBags.it This test is not yet ap proved or cleared by the [place] FDA and  has been authorized for detection and/or diagnosis of SARS-CoV-2 by FDA under an Emergency Use Authorization (EUA). This EUA will remain  in effect (meaning this test can be used) for the duration of the COVID-19 declaration under Section 564(b)(1) of the Act, 21 U.S.C. section 360bbb-3(b)(1), unless the authorization is terminated or revoked sooner.    Influenza A by PCR NEGATIVE NEGATIVE Final   Influenza B by PCR NEGATIVE NEGATIVE Final    Comment: (NOTE) The Xpert Xpress SARS-CoV-2/FLU/RSV assay is intended as an aid in  the diagnosis of influenza from Nasopharyngeal swab specimens and  should not be used as a sole basis for treatment. Nasal washings and  aspirates are unacceptable for Xpert Xpress SARS-CoV-2/FLU/RSV  testing. Fact Sheet for Patients: PinkCheek.be Fact Sheet for Healthcare Providers: GravelBags.it This test is not yet approved or cleared by the [place] FDA and  has been authorized for detection and/or diagnosis of SARS-CoV-2 by  FDA under an Emergency Use Authorization (EUA). This EUA will remain  in  effect (meaning this test can be used) for the duration of the  Covid-19 declaration under Section 564(b)(1)  of the Act, 21  U.S.C. section 360bbb-3(b)(1), unless the authorization is  terminated or revoked. Performed at [facility], [place] [address] [place], [place] [address]   MRSA PCR Screening     Status: None   Collection Time: 06/18/19 10:48 PM   Specimen: Nasopharyngeal  Result Value Ref Range Status   MRSA by PCR NEGATIVE NEGATIVE Final    Comment:        The GeneXpert MRSA Assay (FDA approved for NASAL specimens only), is one component of a comprehensive MRSA colonization surveillance program. It is not intended to diagnose MRSA infection nor to guide or monitor treatment for MRSA infections. Performed at [facility], [place] [address] [place], [place] [address]          Radiology Studies: VAS [place] UPPER EXTREMITY VENOUS DUPLEX  Result Date: 06/21/2019 UPPER VENOUS STUDY  Indications: Swelling Risk Factors: None identified. Limitations: Poor ultrasound/tissue interface, bandages and line. Comparison Study: No prior studies. Performing Technologist: Oliver Hum RVT  Examination Guidelines: A complete evaluation includes B-mode imaging, spectral Doppler, color Doppler, and power Doppler as needed of all accessible portions of each vessel. Bilateral testing is considered an integral part of a complete examination. Limited examinations for reoccurring indications may be performed as noted.  Right Findings: +----------+------------+---------+-----------+----------+-------+ RIGHT     CompressiblePhasicitySpontaneousPropertiesSummary +----------+------------+---------+-----------+----------+-------+ IJV           Full       Yes       Yes                      +----------+------------+---------+-----------+----------+-------+ Subclavian    Full       Yes       Yes                      +----------+------------+---------+-----------+----------+-------+ Axillary      Full       Yes       Yes                       +----------+------------+---------+-----------+----------+-------+ Brachial    Partial      Yes       Yes               Acute  +----------+------------+---------+-----------+----------+-------+ Radial        Full                                          +----------+------------+---------+-----------+----------+-------+ Ulnar         Full                                          +----------+------------+---------+-----------+----------+-------+ Cephalic      Full                                          +----------+------------+---------+-----------+----------+-------+ Basilic       Full                                          +----------+------------+---------+-----------+----------+-------+  Left Findings: +----------+------------+---------+-----------+----------+-------+ LEFT      CompressiblePhasicitySpontaneousPropertiesSummary +----------+------------+---------+-----------+----------+-------+ Subclavian    Full       Yes       Yes                      +----------+------------+---------+-----------+----------+-------+  Summary:  Right: No evidence of superficial vein thrombosis in the upper extremity. Findings consistent with acute deep vein thrombosis involving the right brachial veins.  Left: No evidence of thrombosis in the subclavian.  *See table(s) above for measurements and observations.  Diagnosing physician: Monica Martinez MD Electronically signed by Monica Martinez MD on 06/21/2019 at 11:05:59 AM.    Final         Scheduled Meds: . Chlorhexidine Gluconate Cloth  6 each Topical Daily  . gabapentin  200 mg Oral QHS  . hydrocortisone sod succinate (SOLU-CORTEF) inj  50 mg Intravenous Q12H  . levothyroxine  100 mcg Oral Q0600  . mouth rinse  15 mL Mouth Rinse BID  . metoprolol tartrate  50 mg Oral BID  . metroNIDAZOLE  500 mg Oral Q8H  . mycophenolate  360 mg Oral BID  . pantoprazole  40 mg Oral Daily  . tacrolimus  3 mg Oral BID    Continuous Infusions: . sodium chloride Stopped (06/18/19 2338)  . ferumoxytol       LOS: 4 days     Georgette Shell, MD  06/22/2019, 9:40 AM

## 2019-06-23 LAB — HEMOGLOBIN AND HEMATOCRIT, BLOOD
HCT: 19.3 % — ABNORMAL LOW (ref 36.0–46.0)
HCT: 23.6 % — ABNORMAL LOW (ref 36.0–46.0)
Hemoglobin: 5.8 g/dL — CL (ref 12.0–15.0)
Hemoglobin: 7 g/dL — ABNORMAL LOW (ref 12.0–15.0)

## 2019-06-23 MED ORDER — VITAMIN C 250 MG PO TABS: 500.0000 mg | ORAL_TABLET | Freq: Every day | ORAL | Status: DC

## 2019-06-23 MED ORDER — IRON (FERROUS SULFATE) 325 (65 FE) MG PO TABS: 325.0000 mg | ORAL_TABLET | Freq: Two times a day (BID) | ORAL | Status: DC

## 2019-06-23 MED ORDER — FUROSEMIDE 80 MG PO TABS: 40.0000 mg | ORAL_TABLET | Freq: Every day | ORAL | 2 refills | Status: AC

## 2019-06-23 MED ORDER — SENNA 8.6 MG PO TABS: 2.0000 | ORAL_TABLET | Freq: Every day | ORAL | 0 refills | Status: DC

## 2019-06-23 NOTE — Evaluation (Signed)
Occupational Therapy Evaluation Patient Details Name: [name] MRN: [id number] DOB: 01/16/1941 Today's Date: 06/23/2019    History of Present Illness 79 year old female transferred [facility] to [facility] for rectal bleeding on 06/18/2019.  PMH:of renal transplant with CKD stage IV, HGB 6.3, she is Jehovah witness .Patient was on Coumadin for paroxysmal atrial fibrillation and DVT with an INR of 4.3 at the time of admission.Marland Kitchen Positive DVT in RUE,.   Clinical Impression   Patient is very pleasant 79 year old female that lives with her spouse in a split level home with 1 step to enter and 7 steps from kitchen area to bed/bathroom. Patient is independent at baseline with self care and mobility. Currently patient is supervision level due to decreased activity tolerance. Will continue to follow for acute OT services to maximize patient safety with self care.    Follow Up Recommendations  No OT follow up;Supervision/Assistance - 24 hour    Equipment Recommendations  None recommended by OT       Precautions / Restrictions Precautions Precautions: Fall Precaution Comments: HGB 5.8 today Restrictions Weight Bearing Restrictions: No      Mobility Bed Mobility Overal bed mobility: Modified Independent             General bed mobility comments: HOB elevated  Transfers Overall transfer level: Needs assistance Equipment used: None Transfers: Sit to/from Stand Sit to Stand: Supervision              Balance Overall balance assessment: No apparent balance deficits (not formally assessed)                                         ADL either performed or assessed with clinical judgement   ADL Overall ADL's : Needs assistance/impaired Eating/Feeding: Independent   Grooming: Supervision/safety;Standing   Upper Body Bathing: Set up;Sitting   Lower Body Bathing: Supervison/ safety;Sitting/lateral leans;Sit to/from stand   Upper Body Dressing : Set  up;Sitting   Lower Body Dressing: Supervision/safety;Sitting/lateral leans;Sit to/from stand Lower Body Dressing Details (indicate cue type and reason): pt able to doff/don socks at edge of bed supervision level Toilet Transfer: Supervision/safety;BSC;Ambulation Toilet Transfer Details (indicate cue type and reason): simulated with functional mobility, pt reports just using bedside commode Toileting- Clothing Manipulation and Hygiene: Supervision/safety;Sit to/from stand       Functional mobility during ADLs: Supervision/safety General ADL Comments: patient is supervision level however with decreased activity tolerance due to low hemoglobin                  Pertinent Vitals/Pain Pain Assessment: No/denies pain     Hand Dominance Right   Extremity/Trunk Assessment Upper Extremity Assessment Upper Extremity Assessment: Overall WFL for tasks assessed   Lower Extremity Assessment Lower Extremity Assessment: Defer to PT evaluation   Cervical / Trunk Assessment Cervical / Trunk Assessment: Normal   Communication Communication Communication: No difficulties   Cognition Arousal/Alertness: Awake/alert Behavior During Therapy: WFL for tasks assessed/performed Overall Cognitive Status: Within Functional Limits for tasks assessed                                     General Comments  educate patient on bed level exercises for UB/LB for strengthening while regaining endurance for BADLs  Home Living Family/patient expects to be discharged to:: Private residence Living Arrangements: Spouse/significant other Available Help at Discharge: Family;Available 24 hours/day Type of Home: House Home Access: Stairs to enter [employer] of Steps: 1   Home Layout: Multi-level;Bed/bath upstairs Alternate Level Stairs-Number of Steps: split level with 7 steps   Bathroom Shower/Tub: Teacher, early years/pre: Handicapped height     Home  Equipment: [place] - single point;Walker - 4 wheels;Shower seat;Grab bars - tub/shower          Prior Functioning/Environment Level of Independence: Independent                 OT Problem List: Decreased activity tolerance;Decreased strength      OT Treatment/Interventions: Self-care/ADL training;Therapeutic exercise;Energy conservation;Therapeutic activities;Patient/family education    OT Goals(Current goals can be found in the care plan section) Acute Rehab OT Goals Patient Stated Goal: to go home OT Goal Formulation: With patient Time For Goal Achievement: 07/07/19 Potential to Achieve Goals: Good  OT Frequency: Min 2X/week    AM-PAC OT "6 Clicks" Daily Activity     Outcome Measure Help from another person eating meals?: None Help from another person taking care of personal grooming?: A Little Help from another person toileting, which includes using toliet, bedpan, or urinal?: A Little Help from another person bathing (including washing, rinsing, drying)?: A Little Help from another person to put on and taking off regular upper body clothing?: A Little Help from another person to put on and taking off regular lower body clothing?: A Little 6 Click Score: 19   End of Session Nurse Communication: Mobility status  Activity Tolerance: Patient limited by fatigue Patient left: in bed;with call bell/phone within reach  OT Visit Diagnosis: Muscle weakness (generalized) (M62.81)                Time: 0370-9643 OT Time Calculation (min): 16 min Charges:  OT General Charges $OT Visit: 1 Visit OT Evaluation $OT Eval Low Complexity: 1 Low  Shon Millet OT OT office: [place] 06/23/2019, 9:06 AM

## 2019-06-23 NOTE — Progress Notes (Signed)
Discharge instructions given to patient, all questions answered at this time.  Pt. VSS with no s/s of distress noted.  Patient stable at discharge.  Belongings with patient at time of discharge.

## 2019-06-23 NOTE — Discharge Summary (Signed)
Physician Discharge Summary   PAULSEN QPY:[id number] DOB: [birth year] DOA: 06/18/2019  PCP: Ann Held, DO  Admit date: 06/18/2019 Discharge date: 06/23/2019  Admitted From: home Disposition:  home Recommendations for Outpatient Follow-up:  1. Follow up with PCP in 1-2 weeks 2. Please obtain BMP/CBC in one week 3. Please follow up with dr deterding for procrit and feraheme 4. Medication changes made Lasix decreased to 40 mg daily, Coumadin stopped  Home Health: None  equipment/Devices: None Discharge Condition: Stable and improved CODE STATUS full code  diet recommendation: Cardiac Brief/Interim Summary:79 year old female transferred from [facility] to [facility] for rectal bleeding. She was admitted 06/18/2019. She has history of renal transplant with CKD stage IV she is Jehovah witness was on Coumadin for paroxysmal atrial fibrillation and DVT with an INR of 4.3 at the time of admission.She hadsimilar episode of lower GI bleeding in 2019 when her hemoglobin was 1.9.  2/14 -patient had a BM yesterday which was brown in color with no evidence of blood. She got 1 transfusion of Feraheme. She reports she is stronger and feeling better and was able to sit up and walk without fatigue. Discharge Diagnoses:  Active Problems:   Acute GI bleeding   Acute lower GI bleeding   Lower GI bleeding   Hematochezia   Elevated INR  1 severe anemia secondary to lower GI bleed likely from diverticular bleed. Patient has not received any blood transfusion as she is Jehovah witness. She reported she gets Procrit every 3 weeks by her nephrologist. Tagged RBC scan with no evidence of active bleeding. Trend H&H. She received 1 transfusion of Feraheme on 06/22/2019. Hemoglobin on the day of discharge is 5.8 patient reports no complaints no chest pain palpitation shortness of breath or fatigue.  She feels strong.  She would like to get a second unit of Feraheme as an outpatient.  She will  follow up with Dr. Jimmy Footman or her PCP to arrange for [facility]. I have also prescribed her iron sulfate 2 times a day with vitamin C and senna.  2chronic atrial fibrillation on Coumadin prior to admission to hospital with INR of 4.3. Her Coumadin was stopped due to the fact that patient is bleeding could not be identified or corrected.  Patient was admitted to PCCM and GI was consulted.  The GI physician discussed with her cardiologist and took her off the Coumadin as the benefits outweigh the risk.    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 She is followed at [facility] for the transplant and follows up with Dr. Archie Balboa  bicarb drip stopped 06/21/2019 Continue tacrolimus and mycophenolate.  4 history of obstructive sleep apnea continue CPAP.    5 history of hypothyroidism continue Synthroid.  6 hypomagnesemia she takes magnesium replacements at home continue cautiously with history of CKD.   7 history of essential hypertension continue metoprolol.  Decrease Lasix to 40 mg daily as her blood pressure is not high enough to continue 80 mg daily.  She has no pedal edema she is euvolemic on discharge.   Estimated body mass index is 24.72 kg/m as calculated from the following:   Height as of this encounter: 5' 3"  (1.6 m).   Weight as of this encounter: 63.3 kg.  Discharge Instructions  Discharge Instructions    Call MD for:  difficulty breathing, headache or visual disturbances   Complete by: As directed    Call MD for:  persistant nausea and vomiting   Complete by: As directed    Call MD for:  redness, tenderness, or signs of infection (pain, swelling, redness, odor or green/yellow discharge around incision site)   Complete by: As directed    Call MD for:  severe uncontrolled pain   Complete by: As directed    Call MD for:  temperature >100.4   Complete by: As directed    Diet - low sodium heart  healthy   Complete by: As directed    Increase activity slowly   Complete by: As directed      Allergies as of 06/23/2019      Reactions   Amoxicillin Anaphylaxis   Ampicillin Anaphylaxis   Ciprofloxacin Swelling, Other (See Comments)   "of my throat"   Erythromycin Anaphylaxis   Penicillins Anaphylaxis, Other (See Comments)   Has patient had a PCN reaction causing immediate rash, facial/tongue/throat swelling, SOB or lightheadedness with hypotension: Yes Has patient had a PCN reaction causing severe rash involving mucus membranes or skin necrosis: No Has patient had a PCN reaction that required hospitalization: Yes Has patient had a PCN reaction occurring within the last 10 years: Yes If all of the above answers are "NO", then may proceed with Cephalosporin use.   Tetracycline Anaphylaxis   Labetalol Nausea And Vomiting   Sulfa Antibiotics Itching   Sulfamethoxazole-trimethoprim Itching      Medication List    STOP taking these medications   cefdinir 300 MG capsule Commonly known as: OMNICEF   MAGnesium-Oxide 400 (241.3 Mg) MG tablet Generic drug: magnesium oxide   warfarin 2.5 MG tablet Commonly known as: COUMADIN   warfarin 5 MG tablet Commonly known as: COUMADIN     TAKE these medications   acetaminophen 325 MG tablet Commonly known as: TYLENOL Take 650 mg by mouth every 6 (six) hours as needed for mild pain.   allopurinol 100 MG tablet Commonly known as: ZYLOPRIM Take 1 tablet (100 mg total) by mouth 2 (two) times daily.   Blink Tears 0.25 % Gel Generic drug: Polyethylene Glycol 400 Place 1 drop into both eyes 2 (two) times daily as needed (for dry eyes).   calcitRIOL 0.25 MCG capsule Commonly known as: ROCALTROL Take 0.25-0.5 mcg by mouth See admin instructions. Take 0.5 mcg by mouth daily on Monday, Wednesday, Friday and Take 0.25 mcg by mouth daily on all other days   epoetin alfa 40000 UNIT/ML injection Commonly known as: EPOGEN Inject 40,000 Units  into the skin every 21 ( twenty-one) days.   felodipine 10 MG 24 hr tablet Commonly known as: PLENDIL Take 10 mg by mouth daily.   furosemide 80 MG tablet Commonly known as: LASIX Take 0.5 tablets (40 mg total) by mouth daily. What changed: how much to take   gabapentin 100 MG capsule Commonly known as: NEURONTIN Take 2 capsules (200 mg total) by mouth at bedtime.   Iron (Ferrous Sulfate) 325 (65 Fe) MG Tabs Take 325 mg by mouth 2 (two) times daily.   levothyroxine 100 MCG tablet Commonly known as: SYNTHROID Take 1 tablet (100 mcg total) by mouth daily.   magnesium oxide 400 MG tablet Commonly known as: MAG-OX Take 1 tablet by mouth 2 (two) times daily.   Melatonin 5 MG Caps Take 5 mg by mouth daily.   metoprolol tartrate 50 MG tablet Commonly known as: LOPRESSOR Take 50 mg by mouth 2 (two) times daily.   mycophenolate 180 MG EC tablet Commonly known as: MYFORTIC Take 360 mg  by mouth 2 (two) times daily.   omeprazole 20 MG capsule Commonly known as: PRILOSEC Take 20 mg by mouth daily.   polyethylene glycol 17 g packet Commonly known as: MIRALAX / GLYCOLAX Take 17 g by mouth daily as needed for moderate constipation.   predniSONE 5 MG tablet Commonly known as: DELTASONE Take 5 mg by mouth daily.   PROBIOTIC PO Take 2 tablets by mouth daily.   senna 8.6 MG Tabs tablet Commonly known as: SENOKOT Take 2 tablets (17.2 mg total) by mouth at bedtime.   tacrolimus 1 MG capsule Commonly known as: PROGRAF Take 3 mg by mouth 2 (two) times daily.   vitamin C 250 MG tablet Commonly known as: ASCORBIC ACID Take 2 tablets (500 mg total) by mouth daily.      Follow-up Information    Ann Held, DO Follow up.   Specialty: Family Medicine Contact information: [address] [place] [address] [phone]        Mauricia Area, MD Follow up.   Specialty: Nephrology Why: She received 1 dose of Feraheme on 06/22/2019 Please give  her another dose in 7 days. Consider increasing the frequency of Procrit Contact information: [address] [place] [address] [phone]          Allergies  Allergen Reactions  . Amoxicillin Anaphylaxis  . Ampicillin Anaphylaxis  . Ciprofloxacin Swelling and Other (See Comments)    "of my throat"  . Erythromycin Anaphylaxis  . Penicillins Anaphylaxis and Other (See Comments)    Has patient had a PCN reaction causing immediate rash, facial/tongue/throat swelling, SOB or lightheadedness with hypotension: Yes Has patient had a PCN reaction causing severe rash involving mucus membranes or skin necrosis: No Has patient had a PCN reaction that required hospitalization: Yes Has patient had a PCN reaction occurring within the last 10 years: Yes If all of the above answers are "NO", then may proceed with Cephalosporin use.  . Tetracycline Anaphylaxis  . Labetalol Nausea And Vomiting  . Sulfa Antibiotics Itching  . Sulfamethoxazole-Trimethoprim Itching    Consultations: PCCM and Dr. Cristina Gong  Procedures/Studies: NM GI Blood Loss  Result Date: 06/18/2019 CLINICAL DATA:  Gastrointestinal bleeding, previous lower GI bleed EXAM: NUCLEAR MEDICINE GASTROINTESTINAL BLEEDING SCAN TECHNIQUE: Sequential abdominal images were obtained following intravenous administration of Tc-48mlabeled red blood cells. RADIOPHARMACEUTICALS:  21.1 mCi Tc-993mertechnetate in-vitro labeled red cells. COMPARISON:  02/10/2018 FINDINGS: Anterior planar images of the abdomen and pelvis are obtained after radiotracer administration. Normal distribution of radiotracer throughout the vessels, liver, and spleen. There is no radiotracer accumulation to suggest active gastrointestinal bleeding. IMPRESSION: 1. No evidence of active gastrointestinal bleeding. Electronically Signed   By: MiRanda Ngo.D.   On: 06/18/2019 22:53   DG Chest Port 1 View  Result Date: 06/19/2019 CLINICAL DATA:  Shortness of breath this  morning EXAM: PORTABLE CHEST 1 VIEW COMPARISON:  07/04/2017 FINDINGS: Mild atelectasis or scarring at the left base. There is no edema, consolidation, effusion, or pneumothorax. Normal heart size and mild aortic tortuosity. Postoperative upper abdomen. IMPRESSION: No evidence of active disease. Electronically Signed   By: JoMonte Fantasia.D.   On: 06/19/2019 07:32   VAS USK[place]PPER EXTREMITY VENOUS DUPLEX  Result Date: 06/21/2019 UPPER VENOUS STUDY  Indications: Swelling Risk Factors: None identified. Limitations: Poor ultrasound/tissue interface, bandages and line. Comparison Study: No prior studies. Performing Technologist: GrOliver HumVT  Examination Guidelines: A complete evaluation includes B-mode imaging, spectral Doppler, color Doppler,  and power Doppler as needed of all accessible portions of each vessel. Bilateral testing is considered an integral part of a complete examination. Limited examinations for reoccurring indications may be performed as noted.  Right Findings: +----------+------------+---------+-----------+----------+-------+ RIGHT     CompressiblePhasicitySpontaneousPropertiesSummary +----------+------------+---------+-----------+----------+-------+ IJV           Full       Yes       Yes                      +----------+------------+---------+-----------+----------+-------+ Subclavian    Full       Yes       Yes                      +----------+------------+---------+-----------+----------+-------+ Axillary      Full       Yes       Yes                      +----------+------------+---------+-----------+----------+-------+ Brachial    Partial      Yes       Yes               Acute  +----------+------------+---------+-----------+----------+-------+ Radial        Full                                          +----------+------------+---------+-----------+----------+-------+ Ulnar         Full                                           +----------+------------+---------+-----------+----------+-------+ Cephalic      Full                                          +----------+------------+---------+-----------+----------+-------+ Basilic       Full                                          +----------+------------+---------+-----------+----------+-------+  Left Findings: +----------+------------+---------+-----------+----------+-------+ LEFT      CompressiblePhasicitySpontaneousPropertiesSummary +----------+------------+---------+-----------+----------+-------+ Subclavian    Full       Yes       Yes                      +----------+------------+---------+-----------+----------+-------+  Summary:  Right: No evidence of superficial vein thrombosis in the upper extremity. Findings consistent with acute deep vein thrombosis involving the right brachial veins.  Left: No evidence of thrombosis in the subclavian.  *See table(s) above for measurements and observations.  Diagnosing physician: Monica Martinez MD Electronically signed by Monica Martinez MD on 06/21/2019 at 11:05:59 AM.    Final     (Echo, Carotid, EGD, Colonoscopy, ERCP)    Subjective: Patient is awake alert anxious to go home no new complaints she was able to get up sit in chair yesterday with no fatigue she was ambulated in the room she worked with physical therapy  Discharge Exam: Vitals:   06/23/19 0348 06/23/19 0700  BP:  (!) 141/68  Pulse:  83  Resp:  Marland Kitchen)  23  Temp: 98.5 F (36.9 C)   SpO2:  94%   Vitals:   06/23/19 0100 06/23/19 0348 06/23/19 0500 06/23/19 0700  BP: (!) 141/52   (!) 141/68  Pulse: 84   83  Resp: (!) 23   (!) 23  Temp:  98.5 F (36.9 C)    TempSrc:  Axillary    SpO2: 98%   94%  Weight:   63.3 kg   Height:        General: Pt is alert, awake, not in acute distress Cardiovascular: RRR, S1/S2 +, no rubs, no gallops Respiratory: CTA bilaterally, no wheezing, no rhonchi Abdominal: Soft, NT, ND, bowel sounds  + Extremities: no edema, no cyanosis    The results of significant diagnostics from this hospitalization (including imaging, microbiology, ancillary and laboratory) are listed below for reference.     Microbiology: Recent Results (from the past 240 hour(s))  Respiratory Panel by RT PCR (Flu A&B, Covid) - Nasopharyngeal Swab     Status: None   Collection Time: 06/18/19 12:51 PM   Specimen: Nasopharyngeal Swab  Result Value Ref Range Status   SARS Coronavirus 2 by RT PCR NEGATIVE NEGATIVE Final    Comment: (NOTE) SARS-CoV-2 target nucleic acids are NOT DETECTED. The SARS-CoV-2 RNA is generally detectable in upper respiratoy specimens during the acute phase of infection. The lowest concentration of SARS-CoV-2 viral copies this assay can detect is 131 copies/mL. A negative result does not preclude SARS-Cov-2 infection and should not be used as the sole basis for treatment or other patient management decisions. A negative result may occur with  improper specimen collection/handling, submission of specimen other than nasopharyngeal swab, presence of viral mutation(s) within the areas targeted by this assay, and inadequate number of viral copies (<131 copies/mL). A negative result must be combined with clinical observations, patient history, and epidemiological information. The expected result is Negative. Fact Sheet for Patients:  PinkCheek.be Fact Sheet for Healthcare Providers:  GravelBags.it This test is not yet ap proved or cleared by the [place] FDA and  has been authorized for detection and/or diagnosis of SARS-CoV-2 by FDA under an Emergency Use Authorization (EUA). This EUA will remain  in effect (meaning this test can be used) for the duration of the COVID-19 declaration under Section 564(b)(1) of the Act, 21 U.S.C. section 360bbb-3(b)(1), unless the authorization is terminated or revoked sooner.    Influenza A  by PCR NEGATIVE NEGATIVE Final   Influenza B by PCR NEGATIVE NEGATIVE Final    Comment: (NOTE) The Xpert Xpress SARS-CoV-2/FLU/RSV assay is intended as an aid in  the diagnosis of influenza from Nasopharyngeal swab specimens and  should not be used as a sole basis for treatment. Nasal washings and  aspirates are unacceptable for Xpert Xpress SARS-CoV-2/FLU/RSV  testing. Fact Sheet for Patients: PinkCheek.be Fact Sheet for Healthcare Providers: GravelBags.it This test is not yet approved or cleared by the [place] FDA and  has been authorized for detection and/or diagnosis of SARS-CoV-2 by  FDA under an Emergency Use Authorization (EUA). This EUA will remain  in effect (meaning this test can be used) for the duration of the  Covid-19 declaration under Section 564(b)(1) of the Act, 21  U.S.C. section 360bbb-3(b)(1), unless the authorization is  terminated or revoked. Performed at [facility], [place] [address], [place],  [address]   MRSA PCR Screening     Status: None   Collection Time: 06/18/19 10:48 PM   Specimen: Nasopharyngeal  Result Value Ref  Range Status   MRSA by PCR NEGATIVE NEGATIVE Final    Comment:        The GeneXpert MRSA Assay (FDA approved for NASAL specimens only), is one component of a comprehensive MRSA colonization surveillance program. It is not intended to diagnose MRSA infection nor to guide or monitor treatment for MRSA infections. Performed at [facility], [place] [address] [place], [place] [address]      Labs: BNP (last 3 results) No results for input(s): BNP in the last 8760 hours. Basic Metabolic Panel: Recent Labs  Lab 06/19/19 0551 06/19/19 0759 06/19/19 1212 06/20/19 0533 06/20/19 1410 06/21/19 0230 06/22/19 0209  NA 138  --   --  139 137 137 139  K 5.9*   < > 5.4* 3.8 4.1 3.7 3.8  CL 113*  --   --  105 103 100 103  CO2 18*  --   --  26 26 28  25   GLUCOSE 116*  --   --  118* 152* 151* 139*  BUN 57*  --   --  52* 43* 39* 33*  CREATININE 2.51*  --   --  2.62* 2.51* 2.56* 2.30*  CALCIUM 7.7*  --   --  8.2* 8.3* 8.0* 8.2*  MG  --   --   --   --   --  1.6*  --   PHOS  --   --   --   --   --  3.7  --    < > = values in this interval not displayed.   Liver Function Tests: Recent Labs  Lab 06/18/19 1219 06/18/19 1709  AST 18 16  ALT 12 11  ALKPHOS 122 89  BILITOT 0.7 0.4  PROT 7.0 5.3*  ALBUMIN 4.0 2.9*   No results for input(s): LIPASE, AMYLASE in the last 168 hours. No results for input(s): AMMONIA in the last 168 hours. CBC: Recent Labs  Lab 06/18/19 1219 06/18/19 1219 06/18/19 1500 06/18/19 1500 06/18/19 1709 06/18/19 2305 06/19/19 0211 06/19/19 0759 06/21/19 0230 06/21/19 0815 06/22/19 0209 06/22/19 0830 06/22/19 1414 06/22/19 2015 06/23/19 0146  WBC 12.5*   < > 21.3*  --  18.3*  --  7.1  --  8.8  --  8.5  --   --   --   --   NEUTROABS 8.2*  --  16.4*  --  12.4*  --   --   --   --   --   --   --   --   --   --   HGB 11.4*   < > 9.0*   < > 7.9*   < > 9.1*   < > 5.7*   < > 5.8* 7.4* 6.8* 6.3* 5.8*  HCT 38.0   < > 29.8*   < > 27.0*   < > 30.8*   < > 18.4*   < > 18.9* 24.1* 23.0* 21.1* 19.3*  MCV 95.7   < > 94.9  --  97.8  --  95.7  --  93.4  --  93.6  --   --   --   --   PLT 185   < > 125*  --  149*  --  124*  --  96*  --  120*  --   --   --   --    < > = values in this interval not displayed.   Cardiac Enzymes: No results for input(s): CKTOTAL, CKMB, CKMBINDEX, TROPONINI  in the last 168 hours. BNP: Invalid input(s): POCBNP CBG: No results for input(s): GLUCAP in the last 168 hours. D-Dimer No results for input(s): DDIMER in the last 72 hours. Hgb A1c No results for input(s): HGBA1C in the last 72 hours. Lipid Profile No results for input(s): CHOL, HDL, LDLCALC, TRIG, CHOLHDL, LDLDIRECT in the last 72 hours. Thyroid function studies No results for input(s): TSH, T4TOTAL, T3FREE, THYROIDAB in the last  72 hours.  Invalid input(s): FREET3 Anemia work up No results for input(s): VITAMINB12, FOLATE, FERRITIN, TIBC, IRON, RETICCTPCT in the last 72 hours. Urinalysis    Component Value Date/Time   COLORURINE YELLOW 06/19/2019 1300   APPEARANCEUR CLEAR 06/19/2019 1300   LABSPEC 1.011 06/19/2019 1300   PHURINE 5.0 06/19/2019 1300   GLUCOSEU NEGATIVE 06/19/2019 1300   HGBUR NEGATIVE 06/19/2019 1300   BILIRUBINUR NEGATIVE 06/19/2019 1300   BILIRUBINUR neg 12/04/2017 1321   KETONESUR NEGATIVE 06/19/2019 1300   PROTEINUR NEGATIVE 06/19/2019 1300   UROBILINOGEN 0.2 12/04/2017 1321   UROBILINOGEN 0.2 08/23/2014 0156   NITRITE NEGATIVE 06/19/2019 1300   LEUKOCYTESUR SMALL (A) 06/19/2019 1300   Sepsis Labs Invalid input(s): PROCALCITONIN,  WBC,  LACTICIDVEN Microbiology Recent Results (from the past 240 hour(s))  Respiratory Panel by RT PCR (Flu A&B, Covid) - Nasopharyngeal Swab     Status: None   Collection Time: 06/18/19 12:51 PM   Specimen: Nasopharyngeal Swab  Result Value Ref Range Status   SARS Coronavirus 2 by RT PCR NEGATIVE NEGATIVE Final    Comment: (NOTE) SARS-CoV-2 target nucleic acids are NOT DETECTED. The SARS-CoV-2 RNA is generally detectable in upper respiratoy specimens during the acute phase of infection. The lowest concentration of SARS-CoV-2 viral copies this assay can detect is 131 copies/mL. A negative result does not preclude SARS-Cov-2 infection and should not be used as the sole basis for treatment or other patient management decisions. A negative result may occur with  improper specimen collection/handling, submission of specimen other than nasopharyngeal swab, presence of viral mutation(s) within the areas targeted by this assay, and inadequate number of viral copies (<131 copies/mL). A negative result must be combined with clinical observations, patient history, and epidemiological information. The expected result is Negative. Fact Sheet for Patients:   PinkCheek.be Fact Sheet for Healthcare Providers:  GravelBags.it This test is not yet ap proved or cleared by the [place] FDA and  has been authorized for detection and/or diagnosis of SARS-CoV-2 by FDA under an Emergency Use Authorization (EUA). This EUA will remain  in effect (meaning this test can be used) for the duration of the COVID-19 declaration under Section 564(b)(1) of the Act, 21 U.S.C. section 360bbb-3(b)(1), unless the authorization is terminated or revoked sooner.    Influenza A by PCR NEGATIVE NEGATIVE Final   Influenza B by PCR NEGATIVE NEGATIVE Final    Comment: (NOTE) The Xpert Xpress SARS-CoV-2/FLU/RSV assay is intended as an aid in  the diagnosis of influenza from Nasopharyngeal swab specimens and  should not be used as a sole basis for treatment. Nasal washings and  aspirates are unacceptable for Xpert Xpress SARS-CoV-2/FLU/RSV  testing. Fact Sheet for Patients: PinkCheek.be Fact Sheet for Healthcare Providers: GravelBags.it This test is not yet approved or cleared by the [place] FDA and  has been authorized for detection and/or diagnosis of SARS-CoV-2 by  FDA under an Emergency Use Authorization (EUA). This EUA will remain  in effect (meaning this test can be used) for the duration of the  Covid-19 declaration under Section 564(b)(1)  of the Act, 21  U.S.C. section 360bbb-3(b)(1), unless the authorization is  terminated or revoked. Performed at [facility], [place] [address] [place], [place] [address]   MRSA PCR Screening     Status: None   Collection Time: 06/18/19 10:48 PM   Specimen: Nasopharyngeal  Result Value Ref Range Status   MRSA by PCR NEGATIVE NEGATIVE Final    Comment:        The GeneXpert MRSA Assay (FDA approved for NASAL specimens only), is one component of a comprehensive MRSA colonization surveillance  program. It is not intended to diagnose MRSA infection nor to guide or monitor treatment for MRSA infections. Performed at [facility], [place] [address], [place], [place] [address]      Time coordinating discharge: 39 minutes  SIGNED:   Georgette Shell, MD  [facility] 06/23/2019, 8:37 AM Pager   If 7PM-7AM, please contact night-coverage [email or web address] Password [email or web address]

## 2019-06-23 NOTE — Progress Notes (Signed)
Patient's 2000 Hgb was 6.3 Bodenheimer NP notified. Pt does not report feeling any symptoms related to this at this time. No new orders given at this time. RN will continue to monitor the H&H trend.

## 2019-06-24 ENCOUNTER — Telehealth: Payer: Self-pay | Admitting: *Deleted

## 2019-06-24 NOTE — Telephone Encounter (Signed)
Transition Care Management Follow-up Telephone Call   Date discharged? 06/23/19  How have you been since you were released from the hospital? Still weak. I am taking it easy.   Do you understand why you were in the hospital? yes   Do you understand the discharge instructions? yes   Where were you discharged to? Home w/ spouse   Items Reviewed:  Medications reviewed: no, pt does not have list on hand. Reports they added Ferrous sulfate and senna.   Allergies reviewed: no, I do not have any new allergies  Dietary changes reviewed: yes  Referrals reviewed: yes   Functional Questionnaire:   Activities of Daily Living (ADLs):   She states they are independent in the following: ambulation, bathing and hygiene, feeding, continence, grooming, toileting and dressing States they require assistance with the following: na   Any transportation issues/concerns?: no   Any patient concerns? no   Confirmed importance and date/time of follow-up visits scheduled yes  Provider Appointment booked with PCP 06/27/19  Confirmed with patient if condition begins to worsen call PCP or go to the ER.  Patient was given the office number and encouraged to call back with question or concerns.  : yes

## 2019-06-25 ENCOUNTER — Telehealth: Payer: Self-pay | Admitting: Family Medicine

## 2019-06-25 NOTE — Telephone Encounter (Signed)
Rescheduled for tomorrow

## 2019-06-25 NOTE — Telephone Encounter (Signed)
Patient's husband called in regard to [name] his wife . [name] states that patient is having trouble walking.Just release from hospital.  Patient's husband is requesting a call back from [place] or Dr.   Please advise

## 2019-06-25 NOTE — Telephone Encounter (Signed)
Patient's husband called. Preston(Hubsband) would like Dr. Etter Sjogren -Amanda or CMA to call him back in regards to [name] his wife;recently released from the hospital. Per husband is unable to walk good. Patient is requesting an earlier hospital follow up appointment if possible.    Please advise.

## 2019-06-25 NOTE — Telephone Encounter (Signed)
Two messages left.

## 2019-06-27 ENCOUNTER — Ambulatory Visit (INDEPENDENT_AMBULATORY_CARE_PROVIDER_SITE_OTHER): Payer: Medicare Other | Admitting: Family Medicine

## 2019-06-27 ENCOUNTER — Other Ambulatory Visit: Payer: Self-pay

## 2019-06-27 ENCOUNTER — Inpatient Hospital Stay: Payer: Medicare Other | Admitting: Family Medicine

## 2019-06-27 ENCOUNTER — Encounter: Payer: Self-pay | Admitting: Family Medicine

## 2019-06-27 DIAGNOSIS — M109 Gout, unspecified: Secondary | ICD-10-CM

## 2019-06-27 DIAGNOSIS — Z940 Kidney transplant status: Secondary | ICD-10-CM

## 2019-06-27 DIAGNOSIS — K922 Gastrointestinal hemorrhage, unspecified: Secondary | ICD-10-CM

## 2019-06-27 DIAGNOSIS — E038 Other specified hypothyroidism: Secondary | ICD-10-CM

## 2019-06-27 DIAGNOSIS — I10 Essential (primary) hypertension: Secondary | ICD-10-CM

## 2019-06-27 MED ORDER — HYDROCODONE-ACETAMINOPHEN 5-325 MG PO TABS: 1.0000 | ORAL_TABLET | Freq: Four times a day (QID) | ORAL | 0 refills | Status: AC | PRN

## 2019-06-27 NOTE — Progress Notes (Signed)
Virtual Visit via Video Note  I connected with [name] on 06/27/19 at  9:40 AM EST by a video enabled telemedicine application and verified that I am speaking with the correct person using two identifiers.  Location: Patient: home  Provider: home    I discussed the limitations of evaluation and management by telemedicine and the availability of in person appointments. The patient expressed understanding and agreed to proceed.  History of Present Illness: Pt is home from hosp -- she was in [place] 2/9-2/14 with GI bleed   She will f/u with dr deterding for procrit and feraheme They did not give her her allopurinol in the hosp so she has had a lot of trouble with gout pain in her feet --- she started the allopurinol when she got home  No other complaints    Past Medical History:  Diagnosis Date  . Abdominal wall hernia   . Anemia    "when lupus flares up"  . Anxiety   . Arthritis    "in my joints"  . Atrial fibrillation ([place])   . CAP (community acquired pneumonia) 08/20/2014  . Crohn's disease ([place])   . DVT of leg (deep venous thrombosis) ([place]) 2012-8./13/2013   "have had one in each leg now"  . DVT of lower extremity, bilateral ([place])    "have had them in both legs; last one was on the RLE this year" (04/25/2012)  . GERD (gastroesophageal reflux disease)   . Gout 2016  . History of hiatal hernia   . History of kidney stones   . Hypertension   . Hypothyroidism   . Lower GI bleed 2012  . Numbness and tingling of foot    bilaterally  . OSA on CPAP   . Refusal of blood transfusion for reasons of conscience 12/20/2011   pt is Jehovah's Witness  . Renal cell carcinoma   . Renal disorder    S/P nephrectomy; dialysis; "working fine now" (12/20/11)  . SBO (small bowel obstruction) ([employer]) 11/2016  . SLE (systemic lupus erythematosus) ([place])    Current Outpatient Medications on File Prior to Visit  Medication Sig Dispense Refill  . acetaminophen (TYLENOL) 325 MG tablet Take 650  mg by mouth every 6 (six) hours as needed for mild pain.    Marland Kitchen allopurinol (ZYLOPRIM) 100 MG tablet Take 1 tablet (100 mg total) by mouth 2 (two) times daily. 30 tablet 2  . calcitRIOL (ROCALTROL) 0.25 MCG capsule Take 0.25-0.5 mcg by mouth See admin instructions. Take 0.5 mcg by mouth daily on Monday, Wednesday, Friday and Take 0.25 mcg by mouth daily on all other days    . epoetin alfa (EPOGEN,PROCRIT) 23536 UNIT/ML injection Inject 40,000 Units into the skin every 21 ( twenty-one) days.     . felodipine (PLENDIL) 10 MG 24 hr tablet Take 10 mg by mouth daily.    . furosemide (LASIX) 80 MG tablet Take 0.5 tablets (40 mg total) by mouth daily. 30 tablet 2  . gabapentin (NEURONTIN) 100 MG capsule Take 2 capsules (200 mg total) by mouth at bedtime.    . Iron, Ferrous Sulfate, 325 (65 Fe) MG TABS Take 325 mg by mouth 2 (two) times daily. 30 tablet   . levothyroxine (SYNTHROID, LEVOTHROID) 100 MCG tablet Take 1 tablet (100 mcg total) by mouth daily.    . magnesium oxide (MAG-OX) 400 MG tablet Take 1 tablet by mouth 2 (two) times daily.    . Melatonin 5 MG CAPS Take 5 mg by mouth daily.     Marland Kitchen  metoprolol tartrate (LOPRESSOR) 50 MG tablet Take 50 mg by mouth 2 (two) times daily.    . mycophenolate (MYFORTIC) 180 MG EC tablet Take 360 mg by mouth 2 (two) times daily.      Marland Kitchen omeprazole (PRILOSEC) 20 MG capsule Take 20 mg by mouth daily.    . polyethylene glycol (MIRALAX / GLYCOLAX) packet Take 17 g by mouth daily as needed for moderate constipation.     . Polyethylene Glycol 400 (BLINK TEARS) 0.25 % GEL Place 1 drop into both eyes 2 (two) times daily as needed (for dry eyes).    . predniSONE (DELTASONE) 5 MG tablet Take 5 mg by mouth daily.     . Probiotic Product (PROBIOTIC PO) Take 2 tablets by mouth daily.    Marland Kitchen senna (SENOKOT) 8.6 MG TABS tablet Take 2 tablets (17.2 mg total) by mouth at bedtime. 120 tablet 0  . tacrolimus (PROGRAF) 1 MG capsule Take 3 mg by mouth 2 (two) times daily.     . vitamin C  (ASCORBIC ACID) 250 MG tablet Take 2 tablets (500 mg total) by mouth daily.     No current facility-administered medications on file prior to visit.   Social History   Socioeconomic History  . Marital status: Married    Spouse name: Not on file  . Number of children: 3  . Years of education: Not on file  . Highest education level: Not on file  Occupational History  . 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 Smoking status: Never Smoker  . Smokeless tobacco: Never Used  Substance and Sexual Activity  . Alcohol use: No  . Drug use: No  . Sexual activity: Yes    Birth control/protection: Post-menopausal  Other Topics Concern  . Not on file  Social History Narrative  . Not on file   Social Determinants of Health   Financial Resource Strain:   . Difficulty of Paying Living Expenses: Not on file  Food Insecurity:   . Worried About Charity fundraiser in the Last Year: Not on file  . Ran Out of Food in the Last Year: Not on file  Transportation Needs:   . Lack of Transportation (Medical): Not on file  . Lack of Transportation (Non-Medical): Not on file  Physical Activity:   . Days of Exercise per Week: Not on file  . Minutes of Exercise per Session: Not on file  Stress:   . Feeling of Stress : Not on file  Social Connections:   . Frequency of Communication with Friends and Family: Not on file  . Frequency of Social Gatherings with Friends and Family: Not on file  . Attends Religious Services: Not on file  . Active Member of Clubs or Organizations: Not on file  . Attends Archivist Meetings: Not on file  . Marital Status: Not on file  Intimate Partner Violence:   . Fear of Current or Ex-Partner: Not on file  . Emotionally Abused: Not on file  . Physically Abused: Not on file  . Sexually Abused: Not on file    Observations/Objective: 135/65    97.3    Pt is in NAD --- but does have pain   Assessment and Plan: 1. Lower GI  bleed Check labs  If any obvious signs of bleeding or ext fatigue--- go to er  F/u nephrology  - CBC with Differential/Platelet; Future - Comprehensive metabolic panel; Future - Ambulatory referral to Home Health  2.  Other specified hypothyroidism Check labs  con't meds   3. Essential hypertension Well controlled, no changes to meds. Encouraged heart healthy diet such as the DASH diet and exercise as tolerated.   4. History of renal transplant Per nephrology Pt requesting help at home until she is on her feet again  - Ambulatory referral to [facility]  5. Gout of both feet Pt restarted allopurinol Needs pain med to help  - HYDROcodone-acetaminophen (NORCO) 5-325 MG tablet; Take 1 tablet by mouth every 6 (six) hours as needed for up to 5 days for moderate pain.  Dispense: 30 tablet; Refill: 0 - Ambulatory referral to Home Health   Follow Up Instructions:    I discussed the assessment and treatment plan with the patient. The patient was provided an opportunity to ask questions and all were answered. The patient agreed with the plan and demonstrated an understanding of the instructions.   The patient was advised to call back or seek an in-person evaluation if the symptoms worsen or if the condition fails to improve as anticipated.  I provided 30 minutes of non-face-to-face time during this encounter.   Ann Held, DO

## 2019-06-27 NOTE — Assessment & Plan Note (Signed)
Check labs No further issues

## 2019-06-27 NOTE — Assessment & Plan Note (Signed)
Check labs F/u nephrology 

## 2019-06-27 NOTE — Assessment & Plan Note (Signed)
Stable Lab Results  Component Value Date   TSH 0.69 03/22/2019

## 2019-06-27 NOTE — Assessment & Plan Note (Signed)
Well controlled, no changes to meds. Encouraged heart healthy diet such as the DASH diet and exercise as tolerated.  °

## 2019-06-28 ENCOUNTER — Other Ambulatory Visit: Payer: Self-pay | Admitting: Family Medicine

## 2019-06-28 ENCOUNTER — Encounter: Payer: Self-pay | Admitting: Family Medicine

## 2019-06-28 ENCOUNTER — Telehealth: Payer: Self-pay

## 2019-06-28 DIAGNOSIS — M1A0790 Idiopathic chronic gout, unspecified ankle and foot, without tophus (tophi): Secondary | ICD-10-CM

## 2019-06-28 MED ORDER — ALLOPURINOL 300 MG PO TABS: 300.0000 mg | ORAL_TABLET | Freq: Every day | ORAL | 3 refills | Status: DC

## 2019-06-28 NOTE — Telephone Encounter (Signed)
Caller states she would like to speak with a nurse. She can't walk or put pressure on her feet. She thinks it is her gout. She is wondering how much medication she is able to take.

## 2019-06-28 NOTE — Telephone Encounter (Signed)
Pt notified. New Rx sent in

## 2019-06-28 NOTE — Telephone Encounter (Signed)
Spoke with patient. Pt was wondering if she could have a increase in medication for the allopurinol. Pt states she is still having a lot of pain. Pt had visit yesterday

## 2019-06-28 NOTE — Telephone Encounter (Signed)
I have increased it to 300 mg daily but we will need to check her kidney function next week and uric acid level

## 2019-07-01 ENCOUNTER — Ambulatory Visit (INDEPENDENT_AMBULATORY_CARE_PROVIDER_SITE_OTHER): Payer: Medicare Other | Admitting: Internal Medicine

## 2019-07-01 ENCOUNTER — Other Ambulatory Visit: Payer: Self-pay

## 2019-07-01 ENCOUNTER — Telehealth: Payer: Self-pay | Admitting: Family Medicine

## 2019-07-01 DIAGNOSIS — Z940 Kidney transplant status: Secondary | ICD-10-CM | POA: Diagnosis not present

## 2019-07-01 DIAGNOSIS — G4733 Obstructive sleep apnea (adult) (pediatric): Secondary | ICD-10-CM | POA: Diagnosis not present

## 2019-07-01 DIAGNOSIS — M329 Systemic lupus erythematosus, unspecified: Secondary | ICD-10-CM | POA: Diagnosis not present

## 2019-07-01 DIAGNOSIS — F419 Anxiety disorder, unspecified: Secondary | ICD-10-CM | POA: Diagnosis not present

## 2019-07-01 DIAGNOSIS — Z7952 Long term (current) use of systemic steroids: Secondary | ICD-10-CM | POA: Diagnosis not present

## 2019-07-01 DIAGNOSIS — K922 Gastrointestinal hemorrhage, unspecified: Secondary | ICD-10-CM | POA: Diagnosis not present

## 2019-07-01 DIAGNOSIS — I10 Essential (primary) hypertension: Secondary | ICD-10-CM | POA: Diagnosis not present

## 2019-07-01 DIAGNOSIS — E038 Other specified hypothyroidism: Secondary | ICD-10-CM | POA: Diagnosis not present

## 2019-07-01 DIAGNOSIS — Z9989 Dependence on other enabling machines and devices: Secondary | ICD-10-CM | POA: Diagnosis not present

## 2019-07-01 DIAGNOSIS — M109 Gout, unspecified: Secondary | ICD-10-CM | POA: Diagnosis not present

## 2019-07-01 DIAGNOSIS — D649 Anemia, unspecified: Secondary | ICD-10-CM | POA: Diagnosis not present

## 2019-07-01 DIAGNOSIS — I4891 Unspecified atrial fibrillation: Secondary | ICD-10-CM | POA: Diagnosis not present

## 2019-07-01 DIAGNOSIS — K219 Gastro-esophageal reflux disease without esophagitis: Secondary | ICD-10-CM | POA: Diagnosis not present

## 2019-07-01 DIAGNOSIS — Z85528 Personal history of other malignant neoplasm of kidney: Secondary | ICD-10-CM | POA: Diagnosis not present

## 2019-07-01 DIAGNOSIS — Z86718 Personal history of other venous thrombosis and embolism: Secondary | ICD-10-CM | POA: Diagnosis not present

## 2019-07-01 DIAGNOSIS — K439 Ventral hernia without obstruction or gangrene: Secondary | ICD-10-CM | POA: Diagnosis not present

## 2019-07-01 DIAGNOSIS — M1990 Unspecified osteoarthritis, unspecified site: Secondary | ICD-10-CM | POA: Diagnosis not present

## 2019-07-01 DIAGNOSIS — K5090 Crohn's disease, unspecified, without complications: Secondary | ICD-10-CM | POA: Diagnosis not present

## 2019-07-01 NOTE — Progress Notes (Signed)
HPI Female never smoker followed for OSA NPSG 10/15/13 58.5/ hr. Weight 130 lbs  ----------------------------------------------------------------------------------   06/28/2018- 79 year old female, Jehovah's Witness, never smoker followed for OSA , complicated by allergic rhinitis, anemia, aortic murmur, A. Fib, CKD3/ Renal transplant, Crohn's disease, GERD, HBP, hypothyroid, lupus CPAP auto 5-20/ [facility] -----OSA: DME [facility] Pt wears CPAP nightly and DL attached; pt needs new supplies.  Body weight today 137 pounds Download 100% compliance AHI 1.3/hour She reports doing really well, comfortable with nasal mask.  Sleeping well. Denies other significant medical issues.  07/01/19- Virtual Visit via Telephone Note  I connected with Brendia Sacks on 07/01/19 at  1:30 PM EST by telephone and verified that I am speaking with the correct person using two identifiers.  Location: Patient: H Provider: O   I discussed the limitations, risks, security and privacy concerns of performing an evaluation and management service by telephone and the availability of in person appointments. I also discussed with the patient that there may be a patient responsible charge related to this service. The patient expressed understanding and agreed to proceed.   History of Present Illness: 79 year old female, Jehovah's Witness, never smoker followed for OSA , complicated by allergic rhinitis, anemia, aortic murmur, A. Fib, CKD4/ Renal transplant, Crohn's disease, GERD, HBP, hypothyroid, lupus, PAFib, DVT,  CPAP auto 5-20/ [facility] Download 87%, AHI 1.7/ hr Hosp for rectal bleed. Iron levels have come back up.  Comfortable with CPAP   Observations/Objective: Hosp 2/9- 2/14   Rectal bleeding Now off coumadin  Assessment and Plan: OSA -continue auto 5-20 Anemia- rectal bleed on warfarin, now dc'd  Follow Up Instructions:   1 year I discussed the assessment and treatment plan with the patient. The patient was  provided an opportunity to ask questions and all were answered. The patient agreed with the plan and demonstrated an understanding of the instructions.   The patient was advised to call back or seek an in-person evaluation if the symptoms worsen or if the condition fails to improve as anticipated.  I provided 18 minutes of non-face-to-face time during this encounter.   Baird Lyons, MD    ROS-see HPI  + = positive Constitutional:   No-   weight loss, night sweats, fevers, chills, fatigue, lassitude. HEENT:   No-  headaches, difficulty swallowing, tooth/dental problems, sore throat,       No-  sneezing, itching, ear ache, nasal congestion, post nasal drip,  CV:  No-   chest pain, orthopnea, PND, swelling in lower extremities, anasarca,                                                           dizziness, palpitations Resp: + shortness of breath with exertion or at rest.              No-   productive cough,  No non-productive cough,  No- coughing up of blood.              No-   change in color of mucus.  No- wheezing.   Skin: No-   rash or lesions. GI:  No-   heartburn, indigestion, abdominal pain, nausea, vomiting,  GU:  MS:  No-   joint pain or swelling.   Neuro-     nothing unusual Psych:  No- change in mood  or affect. No depression or anxiety.  No memory loss.  OBJ- Physical Exam General- Alert, Oriented, Affect-appropriate, Distress- none acute Skin- rash-none, lesions- none, excoriation- none Lymphadenopathy- none Head- atraumatic            Eyes- Gross vision intact, PERRLA, conjunctivae and secretions clear            Ears- Hearing, canals-normal            Nose- +mucus bridging, no-Septal dev, mucus, polyps, erosion, perforation             Throat- Mallampati II-III , mucosa -Not dry , drainage- none, tonsils- atrophic Neck- flexible , trachea midline, no stridor , thyroid nl, carotid no bruit Chest - symmetrical excursion , unlabored           Heart/CV- RRR , no murmur  heard , no gallop  , no rub, nl s1 s2                           - JVD- none , edema- none, stasis changes- none, varices- none           Lung- clear, wheeze- none, cough- none , dullness-none, rub- none           Chest wall-  Abd- Br/ Gen/ Rectal- Not done, not indicated Extrem- cyanosis- none, clubbing, none, atrophy- none, strength- nl. + Right shoulder painful-cannot raise at shoulder Neuro- grossly intact to observation

## 2019-07-01 NOTE — Telephone Encounter (Signed)
Per Lytle Michaels with [facility]  @ [phone], states they need verbal order for in home care ,

## 2019-07-01 NOTE — Patient Instructions (Signed)
We can continue CPAP auto 5-20, mask of choice, humidifier, supplies, AirView/ card  Please call if we can help

## 2019-07-02 ENCOUNTER — Telehealth: Payer: Self-pay | Admitting: Family Medicine

## 2019-07-02 DIAGNOSIS — K922 Gastrointestinal hemorrhage, unspecified: Secondary | ICD-10-CM | POA: Diagnosis not present

## 2019-07-02 DIAGNOSIS — K439 Ventral hernia without obstruction or gangrene: Secondary | ICD-10-CM | POA: Diagnosis not present

## 2019-07-02 DIAGNOSIS — K5090 Crohn's disease, unspecified, without complications: Secondary | ICD-10-CM | POA: Diagnosis not present

## 2019-07-02 DIAGNOSIS — I4891 Unspecified atrial fibrillation: Secondary | ICD-10-CM | POA: Diagnosis not present

## 2019-07-02 DIAGNOSIS — I10 Essential (primary) hypertension: Secondary | ICD-10-CM | POA: Diagnosis not present

## 2019-07-02 DIAGNOSIS — M109 Gout, unspecified: Secondary | ICD-10-CM | POA: Diagnosis not present

## 2019-07-02 NOTE — Telephone Encounter (Signed)
[name] [phone])  wants a Verbal on PT for balance strength and Endurance  and  2 times a week 4 week once a week for 5 weeks

## 2019-07-02 NOTE — Telephone Encounter (Signed)
Verbal given 

## 2019-07-02 NOTE — Telephone Encounter (Signed)
Verbal orders given  

## 2019-07-03 ENCOUNTER — Other Ambulatory Visit: Payer: Medicare Other

## 2019-07-03 DIAGNOSIS — K439 Ventral hernia without obstruction or gangrene: Secondary | ICD-10-CM | POA: Diagnosis not present

## 2019-07-03 DIAGNOSIS — M109 Gout, unspecified: Secondary | ICD-10-CM | POA: Diagnosis not present

## 2019-07-03 DIAGNOSIS — K5090 Crohn's disease, unspecified, without complications: Secondary | ICD-10-CM | POA: Diagnosis not present

## 2019-07-03 DIAGNOSIS — I10 Essential (primary) hypertension: Secondary | ICD-10-CM | POA: Diagnosis not present

## 2019-07-03 DIAGNOSIS — K922 Gastrointestinal hemorrhage, unspecified: Secondary | ICD-10-CM | POA: Diagnosis not present

## 2019-07-03 DIAGNOSIS — I4891 Unspecified atrial fibrillation: Secondary | ICD-10-CM | POA: Diagnosis not present

## 2019-07-04 ENCOUNTER — Ambulatory Visit (HOSPITAL_COMMUNITY)
Admission: RE | Admit: 2019-07-04 | Discharge: 2019-07-04 | Disposition: A | Discharge status: Home / Self Care | Payer: Medicare Other | Source: Ambulatory Visit | Attending: Nephrology | Admitting: Nephrology

## 2019-07-04 ENCOUNTER — Other Ambulatory Visit: Payer: Self-pay

## 2019-07-04 VITALS — BP 135/64 | HR 77 | Temp 97.4°F | Resp 20

## 2019-07-04 DIAGNOSIS — N183 Chronic kidney disease, stage 3 unspecified: Secondary | ICD-10-CM

## 2019-07-04 DIAGNOSIS — N184 Chronic kidney disease, stage 4 (severe): Secondary | ICD-10-CM | POA: Insufficient documentation

## 2019-07-04 DIAGNOSIS — D631 Anemia in chronic kidney disease: Secondary | ICD-10-CM | POA: Diagnosis not present

## 2019-07-04 LAB — PROTIME-INR
INR: 1 (ref 0.8–1.2)
Prothrombin Time: 13.4 seconds (ref 11.4–15.2)

## 2019-07-04 LAB — RENAL FUNCTION PANEL
Albumin: 3.2 g/dL — ABNORMAL LOW (ref 3.5–5.0)
Anion gap: 13 (ref 5–15)
BUN: 32 mg/dL — ABNORMAL HIGH (ref 8–23)
CO2: 22 mmol/L (ref 22–32)
Calcium: 9 mg/dL (ref 8.9–10.3)
Chloride: 101 mmol/L (ref 98–111)
Creatinine, Ser: 2.3 mg/dL — ABNORMAL HIGH (ref 0.44–1.00)
GFR calc Af Amer: 23 mL/min — ABNORMAL LOW (ref >60)
GFR calc non Af Amer: 20 mL/min — ABNORMAL LOW (ref >60)
Glucose, Bld: 99 mg/dL (ref 70–99)
Phosphorus: 4.5 mg/dL (ref 2.5–4.6)
Potassium: 4.1 mmol/L (ref 3.5–5.1)
Sodium: 136 mmol/L (ref 135–145)

## 2019-07-04 LAB — IRON AND TIBC
Iron: <5 ug/dL — ABNORMAL LOW (ref 28–170)
TIBC: 176 ug/dL — ABNORMAL LOW (ref 250–450)

## 2019-07-04 LAB — FERRITIN: Ferritin: 883 ng/mL — ABNORMAL HIGH (ref 11–307)

## 2019-07-04 LAB — POCT HEMOGLOBIN-HEMACUE: Hemoglobin: 6.8 g/dL — CL (ref 12.0–15.0)

## 2019-07-04 MED ORDER — EPOETIN ALFA 40000 UNIT/ML IJ SOLN: 40000.0000 [IU] | INTRAMUSCULAR | Status: DC

## 2019-07-04 MED ORDER — EPOETIN ALFA 40000 UNIT/ML IJ SOLN
INTRAMUSCULAR | Status: AC
  Administered 2019-07-04: 40000 [IU] via SUBCUTANEOUS
  Filled 2019-07-04: qty 1

## 2019-07-05 DIAGNOSIS — M109 Gout, unspecified: Secondary | ICD-10-CM | POA: Diagnosis not present

## 2019-07-05 DIAGNOSIS — K922 Gastrointestinal hemorrhage, unspecified: Secondary | ICD-10-CM | POA: Diagnosis not present

## 2019-07-05 DIAGNOSIS — K5090 Crohn's disease, unspecified, without complications: Secondary | ICD-10-CM | POA: Diagnosis not present

## 2019-07-05 DIAGNOSIS — I10 Essential (primary) hypertension: Secondary | ICD-10-CM | POA: Diagnosis not present

## 2019-07-05 DIAGNOSIS — K439 Ventral hernia without obstruction or gangrene: Secondary | ICD-10-CM | POA: Diagnosis not present

## 2019-07-05 DIAGNOSIS — I4891 Unspecified atrial fibrillation: Secondary | ICD-10-CM | POA: Diagnosis not present

## 2019-07-09 DIAGNOSIS — K439 Ventral hernia without obstruction or gangrene: Secondary | ICD-10-CM | POA: Diagnosis not present

## 2019-07-09 DIAGNOSIS — K5090 Crohn's disease, unspecified, without complications: Secondary | ICD-10-CM | POA: Diagnosis not present

## 2019-07-09 DIAGNOSIS — I10 Essential (primary) hypertension: Secondary | ICD-10-CM | POA: Diagnosis not present

## 2019-07-09 DIAGNOSIS — I4891 Unspecified atrial fibrillation: Secondary | ICD-10-CM | POA: Diagnosis not present

## 2019-07-09 DIAGNOSIS — K922 Gastrointestinal hemorrhage, unspecified: Secondary | ICD-10-CM | POA: Diagnosis not present

## 2019-07-09 DIAGNOSIS — M109 Gout, unspecified: Secondary | ICD-10-CM | POA: Diagnosis not present

## 2019-07-10 DIAGNOSIS — K5090 Crohn's disease, unspecified, without complications: Secondary | ICD-10-CM | POA: Diagnosis not present

## 2019-07-10 DIAGNOSIS — I4891 Unspecified atrial fibrillation: Secondary | ICD-10-CM | POA: Diagnosis not present

## 2019-07-10 DIAGNOSIS — K922 Gastrointestinal hemorrhage, unspecified: Secondary | ICD-10-CM | POA: Diagnosis not present

## 2019-07-10 DIAGNOSIS — I10 Essential (primary) hypertension: Secondary | ICD-10-CM | POA: Diagnosis not present

## 2019-07-10 DIAGNOSIS — M109 Gout, unspecified: Secondary | ICD-10-CM | POA: Diagnosis not present

## 2019-07-10 DIAGNOSIS — K439 Ventral hernia without obstruction or gangrene: Secondary | ICD-10-CM | POA: Diagnosis not present

## 2019-07-11 ENCOUNTER — Encounter (HOSPITAL_COMMUNITY): Payer: Medicare Other

## 2019-07-11 ENCOUNTER — Other Ambulatory Visit (HOSPITAL_COMMUNITY): Payer: Self-pay | Admitting: *Deleted

## 2019-07-12 ENCOUNTER — Ambulatory Visit (HOSPITAL_COMMUNITY)
Admission: RE | Admit: 2019-07-12 | Discharge: 2019-07-12 | Disposition: A | Discharge status: Home / Self Care | Payer: Medicare Other | Source: Ambulatory Visit | Attending: Nephrology | Admitting: Nephrology

## 2019-07-12 ENCOUNTER — Other Ambulatory Visit: Payer: Self-pay

## 2019-07-12 DIAGNOSIS — Z7901 Long term (current) use of anticoagulants: Secondary | ICD-10-CM | POA: Diagnosis not present

## 2019-07-12 DIAGNOSIS — N184 Chronic kidney disease, stage 4 (severe): Secondary | ICD-10-CM | POA: Diagnosis not present

## 2019-07-12 DIAGNOSIS — D631 Anemia in chronic kidney disease: Secondary | ICD-10-CM | POA: Diagnosis not present

## 2019-07-12 LAB — POCT HEMOGLOBIN-HEMACUE: Hemoglobin: 8.1 g/dL — ABNORMAL LOW (ref 12.0–15.0)

## 2019-07-12 MED ORDER — SODIUM CHLORIDE 0.9 % IV SOLN
510.0000 mg | INTRAVENOUS | Status: DC
  Administered 2019-07-12: 510 mg via INTRAVENOUS
  Filled 2019-07-12: qty 510

## 2019-07-12 MED ORDER — EPOETIN ALFA 40000 UNIT/ML IJ SOLN
INTRAMUSCULAR | Status: AC
  Administered 2019-07-12: 40000 [IU] via SUBCUTANEOUS
  Filled 2019-07-12: qty 1

## 2019-07-13 DIAGNOSIS — K5090 Crohn's disease, unspecified, without complications: Secondary | ICD-10-CM | POA: Diagnosis not present

## 2019-07-13 DIAGNOSIS — I4891 Unspecified atrial fibrillation: Secondary | ICD-10-CM | POA: Diagnosis not present

## 2019-07-13 DIAGNOSIS — M109 Gout, unspecified: Secondary | ICD-10-CM | POA: Diagnosis not present

## 2019-07-13 DIAGNOSIS — K439 Ventral hernia without obstruction or gangrene: Secondary | ICD-10-CM | POA: Diagnosis not present

## 2019-07-13 DIAGNOSIS — K922 Gastrointestinal hemorrhage, unspecified: Secondary | ICD-10-CM | POA: Diagnosis not present

## 2019-07-13 DIAGNOSIS — I10 Essential (primary) hypertension: Secondary | ICD-10-CM | POA: Diagnosis not present

## 2019-07-14 ENCOUNTER — Encounter: Payer: Self-pay | Admitting: Internal Medicine

## 2019-07-14 NOTE — Assessment & Plan Note (Signed)
Reports now controlled, off warfarin

## 2019-07-14 NOTE — Assessment & Plan Note (Signed)
Benefits with good compliance and control Plan- continue auto 5-20

## 2019-07-14 NOTE — Assessment & Plan Note (Signed)
Managed by cardiology. Now off warfarin after GI bleed

## 2019-07-16 DIAGNOSIS — K5090 Crohn's disease, unspecified, without complications: Secondary | ICD-10-CM | POA: Diagnosis not present

## 2019-07-16 DIAGNOSIS — I10 Essential (primary) hypertension: Secondary | ICD-10-CM | POA: Diagnosis not present

## 2019-07-16 DIAGNOSIS — I4891 Unspecified atrial fibrillation: Secondary | ICD-10-CM | POA: Diagnosis not present

## 2019-07-16 DIAGNOSIS — M109 Gout, unspecified: Secondary | ICD-10-CM | POA: Diagnosis not present

## 2019-07-16 DIAGNOSIS — K439 Ventral hernia without obstruction or gangrene: Secondary | ICD-10-CM | POA: Diagnosis not present

## 2019-07-16 DIAGNOSIS — K922 Gastrointestinal hemorrhage, unspecified: Secondary | ICD-10-CM | POA: Diagnosis not present

## 2019-07-17 DIAGNOSIS — I129 Hypertensive chronic kidney disease with stage 1 through stage 4 chronic kidney disease, or unspecified chronic kidney disease: Secondary | ICD-10-CM | POA: Diagnosis not present

## 2019-07-17 DIAGNOSIS — D631 Anemia in chronic kidney disease: Secondary | ICD-10-CM | POA: Diagnosis not present

## 2019-07-17 DIAGNOSIS — I4891 Unspecified atrial fibrillation: Secondary | ICD-10-CM | POA: Diagnosis not present

## 2019-07-17 DIAGNOSIS — K5090 Crohn's disease, unspecified, without complications: Secondary | ICD-10-CM | POA: Diagnosis not present

## 2019-07-17 DIAGNOSIS — M109 Gout, unspecified: Secondary | ICD-10-CM | POA: Diagnosis not present

## 2019-07-17 DIAGNOSIS — Z940 Kidney transplant status: Secondary | ICD-10-CM | POA: Diagnosis not present

## 2019-07-17 DIAGNOSIS — K922 Gastrointestinal hemorrhage, unspecified: Secondary | ICD-10-CM | POA: Diagnosis not present

## 2019-07-17 DIAGNOSIS — E039 Hypothyroidism, unspecified: Secondary | ICD-10-CM | POA: Diagnosis not present

## 2019-07-17 DIAGNOSIS — I10 Essential (primary) hypertension: Secondary | ICD-10-CM | POA: Diagnosis not present

## 2019-07-17 DIAGNOSIS — M329 Systemic lupus erythematosus, unspecified: Secondary | ICD-10-CM | POA: Diagnosis not present

## 2019-07-17 DIAGNOSIS — N184 Chronic kidney disease, stage 4 (severe): Secondary | ICD-10-CM | POA: Diagnosis not present

## 2019-07-17 DIAGNOSIS — Z86718 Personal history of other venous thrombosis and embolism: Secondary | ICD-10-CM | POA: Diagnosis not present

## 2019-07-17 DIAGNOSIS — K439 Ventral hernia without obstruction or gangrene: Secondary | ICD-10-CM | POA: Diagnosis not present

## 2019-07-17 DIAGNOSIS — N2589 Other disorders resulting from impaired renal tubular function: Secondary | ICD-10-CM | POA: Diagnosis not present

## 2019-07-17 DIAGNOSIS — N2581 Secondary hyperparathyroidism of renal origin: Secondary | ICD-10-CM | POA: Diagnosis not present

## 2019-07-18 DIAGNOSIS — K439 Ventral hernia without obstruction or gangrene: Secondary | ICD-10-CM | POA: Diagnosis not present

## 2019-07-18 DIAGNOSIS — K922 Gastrointestinal hemorrhage, unspecified: Secondary | ICD-10-CM | POA: Diagnosis not present

## 2019-07-18 DIAGNOSIS — K5090 Crohn's disease, unspecified, without complications: Secondary | ICD-10-CM | POA: Diagnosis not present

## 2019-07-18 DIAGNOSIS — M109 Gout, unspecified: Secondary | ICD-10-CM | POA: Diagnosis not present

## 2019-07-18 DIAGNOSIS — I4891 Unspecified atrial fibrillation: Secondary | ICD-10-CM | POA: Diagnosis not present

## 2019-07-18 DIAGNOSIS — I10 Essential (primary) hypertension: Secondary | ICD-10-CM | POA: Diagnosis not present

## 2019-07-19 ENCOUNTER — Other Ambulatory Visit: Payer: Self-pay

## 2019-07-19 ENCOUNTER — Encounter (HOSPITAL_COMMUNITY)
Admission: RE | Admit: 2019-07-19 | Discharge: 2019-07-19 | Disposition: A | Discharge status: Home / Self Care | Payer: Medicare Other | Source: Ambulatory Visit | Attending: Nephrology | Admitting: Nephrology

## 2019-07-19 VITALS — BP 127/65 | HR 77 | Temp 96.7°F | Resp 20 | Ht 63.0 in | Wt 139.0 lb

## 2019-07-19 DIAGNOSIS — D631 Anemia in chronic kidney disease: Secondary | ICD-10-CM | POA: Diagnosis not present

## 2019-07-19 DIAGNOSIS — N183 Chronic kidney disease, stage 3 unspecified: Secondary | ICD-10-CM

## 2019-07-19 DIAGNOSIS — N184 Chronic kidney disease, stage 4 (severe): Secondary | ICD-10-CM | POA: Insufficient documentation

## 2019-07-19 LAB — POCT HEMOGLOBIN-HEMACUE: Hemoglobin: 9.3 g/dL — ABNORMAL LOW (ref 12.0–15.0)

## 2019-07-19 MED ORDER — SODIUM CHLORIDE 0.9 % IV SOLN
510.0000 mg | INTRAVENOUS | Status: DC
  Administered 2019-07-19: 510 mg via INTRAVENOUS
  Filled 2019-07-19: qty 17

## 2019-07-19 MED ORDER — EPOETIN ALFA 10000 UNIT/ML IJ SOLN: 40000.0000 [IU] | INTRAMUSCULAR | Status: DC

## 2019-07-19 MED ORDER — EPOETIN ALFA 40000 UNIT/ML IJ SOLN
INTRAMUSCULAR | Status: AC
  Administered 2019-07-19: 40000 [IU] via SUBCUTANEOUS
  Filled 2019-07-19: qty 1

## 2019-07-23 DIAGNOSIS — K922 Gastrointestinal hemorrhage, unspecified: Secondary | ICD-10-CM | POA: Diagnosis not present

## 2019-07-23 DIAGNOSIS — K5090 Crohn's disease, unspecified, without complications: Secondary | ICD-10-CM | POA: Diagnosis not present

## 2019-07-23 DIAGNOSIS — I10 Essential (primary) hypertension: Secondary | ICD-10-CM | POA: Diagnosis not present

## 2019-07-23 DIAGNOSIS — M109 Gout, unspecified: Secondary | ICD-10-CM | POA: Diagnosis not present

## 2019-07-23 DIAGNOSIS — K439 Ventral hernia without obstruction or gangrene: Secondary | ICD-10-CM | POA: Diagnosis not present

## 2019-07-23 DIAGNOSIS — I4891 Unspecified atrial fibrillation: Secondary | ICD-10-CM | POA: Diagnosis not present

## 2019-07-25 DIAGNOSIS — I10 Essential (primary) hypertension: Secondary | ICD-10-CM | POA: Diagnosis not present

## 2019-07-25 DIAGNOSIS — K439 Ventral hernia without obstruction or gangrene: Secondary | ICD-10-CM | POA: Diagnosis not present

## 2019-07-25 DIAGNOSIS — K5090 Crohn's disease, unspecified, without complications: Secondary | ICD-10-CM | POA: Diagnosis not present

## 2019-07-25 DIAGNOSIS — M109 Gout, unspecified: Secondary | ICD-10-CM | POA: Diagnosis not present

## 2019-07-25 DIAGNOSIS — K922 Gastrointestinal hemorrhage, unspecified: Secondary | ICD-10-CM | POA: Diagnosis not present

## 2019-07-25 DIAGNOSIS — I4891 Unspecified atrial fibrillation: Secondary | ICD-10-CM | POA: Diagnosis not present

## 2019-07-26 ENCOUNTER — Other Ambulatory Visit: Payer: Self-pay

## 2019-07-26 ENCOUNTER — Encounter (HOSPITAL_COMMUNITY)
Admission: RE | Admit: 2019-07-26 | Discharge: 2019-07-26 | Disposition: A | Discharge status: Home / Self Care | Payer: Medicare Other | Source: Ambulatory Visit | Attending: Nephrology | Admitting: Nephrology

## 2019-07-26 VITALS — BP 137/67 | HR 69 | Resp 18

## 2019-07-26 DIAGNOSIS — N184 Chronic kidney disease, stage 4 (severe): Secondary | ICD-10-CM | POA: Diagnosis not present

## 2019-07-26 DIAGNOSIS — N183 Chronic kidney disease, stage 3 unspecified: Secondary | ICD-10-CM

## 2019-07-26 DIAGNOSIS — D631 Anemia in chronic kidney disease: Secondary | ICD-10-CM | POA: Diagnosis not present

## 2019-07-26 LAB — RENAL FUNCTION PANEL
Albumin: 4 g/dL (ref 3.5–5.0)
Anion gap: 14 (ref 5–15)
BUN: 42 mg/dL — ABNORMAL HIGH (ref 8–23)
CO2: 18 mmol/L — ABNORMAL LOW (ref 22–32)
Calcium: 9.5 mg/dL (ref 8.9–10.3)
Chloride: 108 mmol/L (ref 98–111)
Creatinine, Ser: 2.27 mg/dL — ABNORMAL HIGH (ref 0.44–1.00)
GFR calc Af Amer: 24 mL/min — ABNORMAL LOW (ref >60)
GFR calc non Af Amer: 20 mL/min — ABNORMAL LOW (ref >60)
Glucose, Bld: 88 mg/dL (ref 70–99)
Phosphorus: 5 mg/dL — ABNORMAL HIGH (ref 2.5–4.6)
Potassium: 3.8 mmol/L (ref 3.5–5.1)
Sodium: 140 mmol/L (ref 135–145)

## 2019-07-26 LAB — POCT HEMOGLOBIN-HEMACUE: Hemoglobin: 11.7 g/dL — ABNORMAL LOW (ref 12.0–15.0)

## 2019-07-26 MED ORDER — EPOETIN ALFA 40000 UNIT/ML IJ SOLN
INTRAMUSCULAR | Status: AC
  Administered 2019-07-26: 40000 [IU] via SUBCUTANEOUS
  Filled 2019-07-26: qty 1

## 2019-07-26 MED ORDER — EPOETIN ALFA 40000 UNIT/ML IJ SOLN: 40000.0000 [IU] | INTRAMUSCULAR | Status: DC

## 2019-07-27 LAB — PTH, INTACT AND CALCIUM
Calcium, Total (PTH): 9.2 mg/dL (ref 8.7–10.3)
PTH: 27 pg/mL (ref 15–65)

## 2019-07-29 DIAGNOSIS — K922 Gastrointestinal hemorrhage, unspecified: Secondary | ICD-10-CM | POA: Diagnosis not present

## 2019-07-29 DIAGNOSIS — I4891 Unspecified atrial fibrillation: Secondary | ICD-10-CM | POA: Diagnosis not present

## 2019-07-29 DIAGNOSIS — I10 Essential (primary) hypertension: Secondary | ICD-10-CM | POA: Diagnosis not present

## 2019-07-29 DIAGNOSIS — K5090 Crohn's disease, unspecified, without complications: Secondary | ICD-10-CM | POA: Diagnosis not present

## 2019-07-29 DIAGNOSIS — K439 Ventral hernia without obstruction or gangrene: Secondary | ICD-10-CM | POA: Diagnosis not present

## 2019-07-29 DIAGNOSIS — M109 Gout, unspecified: Secondary | ICD-10-CM | POA: Diagnosis not present

## 2019-07-30 DIAGNOSIS — I10 Essential (primary) hypertension: Secondary | ICD-10-CM | POA: Diagnosis not present

## 2019-07-30 DIAGNOSIS — M109 Gout, unspecified: Secondary | ICD-10-CM | POA: Diagnosis not present

## 2019-07-30 DIAGNOSIS — K922 Gastrointestinal hemorrhage, unspecified: Secondary | ICD-10-CM | POA: Diagnosis not present

## 2019-07-30 DIAGNOSIS — K5090 Crohn's disease, unspecified, without complications: Secondary | ICD-10-CM | POA: Diagnosis not present

## 2019-07-30 DIAGNOSIS — K439 Ventral hernia without obstruction or gangrene: Secondary | ICD-10-CM | POA: Diagnosis not present

## 2019-07-30 DIAGNOSIS — I4891 Unspecified atrial fibrillation: Secondary | ICD-10-CM | POA: Diagnosis not present

## 2019-07-31 DIAGNOSIS — I10 Essential (primary) hypertension: Secondary | ICD-10-CM | POA: Diagnosis not present

## 2019-07-31 DIAGNOSIS — Z7952 Long term (current) use of systemic steroids: Secondary | ICD-10-CM | POA: Diagnosis not present

## 2019-07-31 DIAGNOSIS — Z86718 Personal history of other venous thrombosis and embolism: Secondary | ICD-10-CM | POA: Diagnosis not present

## 2019-07-31 DIAGNOSIS — K922 Gastrointestinal hemorrhage, unspecified: Secondary | ICD-10-CM | POA: Diagnosis not present

## 2019-07-31 DIAGNOSIS — K439 Ventral hernia without obstruction or gangrene: Secondary | ICD-10-CM | POA: Diagnosis not present

## 2019-07-31 DIAGNOSIS — E038 Other specified hypothyroidism: Secondary | ICD-10-CM | POA: Diagnosis not present

## 2019-07-31 DIAGNOSIS — Z940 Kidney transplant status: Secondary | ICD-10-CM | POA: Diagnosis not present

## 2019-07-31 DIAGNOSIS — Z9989 Dependence on other enabling machines and devices: Secondary | ICD-10-CM | POA: Diagnosis not present

## 2019-07-31 DIAGNOSIS — M329 Systemic lupus erythematosus, unspecified: Secondary | ICD-10-CM | POA: Diagnosis not present

## 2019-07-31 DIAGNOSIS — K219 Gastro-esophageal reflux disease without esophagitis: Secondary | ICD-10-CM | POA: Diagnosis not present

## 2019-07-31 DIAGNOSIS — M109 Gout, unspecified: Secondary | ICD-10-CM | POA: Diagnosis not present

## 2019-07-31 DIAGNOSIS — F419 Anxiety disorder, unspecified: Secondary | ICD-10-CM | POA: Diagnosis not present

## 2019-07-31 DIAGNOSIS — D649 Anemia, unspecified: Secondary | ICD-10-CM | POA: Diagnosis not present

## 2019-07-31 DIAGNOSIS — M1990 Unspecified osteoarthritis, unspecified site: Secondary | ICD-10-CM | POA: Diagnosis not present

## 2019-07-31 DIAGNOSIS — K5090 Crohn's disease, unspecified, without complications: Secondary | ICD-10-CM | POA: Diagnosis not present

## 2019-07-31 DIAGNOSIS — G4733 Obstructive sleep apnea (adult) (pediatric): Secondary | ICD-10-CM | POA: Diagnosis not present

## 2019-07-31 DIAGNOSIS — I4891 Unspecified atrial fibrillation: Secondary | ICD-10-CM | POA: Diagnosis not present

## 2019-07-31 DIAGNOSIS — Z85528 Personal history of other malignant neoplasm of kidney: Secondary | ICD-10-CM | POA: Diagnosis not present

## 2019-08-02 ENCOUNTER — Telehealth: Payer: Self-pay

## 2019-08-02 ENCOUNTER — Ambulatory Visit (HOSPITAL_COMMUNITY)
Admission: RE | Admit: 2019-08-02 | Discharge: 2019-08-02 | Disposition: A | Discharge status: Home / Self Care | Payer: Medicare Other | Source: Ambulatory Visit | Attending: Nephrology | Admitting: Nephrology

## 2019-08-02 ENCOUNTER — Ambulatory Visit (INDEPENDENT_AMBULATORY_CARE_PROVIDER_SITE_OTHER): Payer: Medicare Other | Admitting: Family Medicine

## 2019-08-02 ENCOUNTER — Ambulatory Visit (HOSPITAL_BASED_OUTPATIENT_CLINIC_OR_DEPARTMENT_OTHER)
Admission: RE | Admit: 2019-08-02 | Discharge: 2019-08-02 | Disposition: A | Discharge status: Home / Self Care | Payer: Medicare Other | Source: Ambulatory Visit | Attending: Family Medicine | Admitting: Family Medicine

## 2019-08-02 ENCOUNTER — Other Ambulatory Visit: Payer: Self-pay

## 2019-08-02 ENCOUNTER — Encounter: Payer: Self-pay | Admitting: Family Medicine

## 2019-08-02 VITALS — BP 152/99 | HR 81 | Temp 96.6°F | Resp 20

## 2019-08-02 DIAGNOSIS — I824Y3 Acute embolism and thrombosis of unspecified deep veins of proximal lower extremity, bilateral: Secondary | ICD-10-CM

## 2019-08-02 DIAGNOSIS — D631 Anemia in chronic kidney disease: Secondary | ICD-10-CM | POA: Insufficient documentation

## 2019-08-02 DIAGNOSIS — I82411 Acute embolism and thrombosis of right femoral vein: Secondary | ICD-10-CM | POA: Diagnosis not present

## 2019-08-02 DIAGNOSIS — N183 Chronic kidney disease, stage 3 unspecified: Secondary | ICD-10-CM

## 2019-08-02 DIAGNOSIS — M79661 Pain in right lower leg: Secondary | ICD-10-CM

## 2019-08-02 DIAGNOSIS — N184 Chronic kidney disease, stage 4 (severe): Secondary | ICD-10-CM | POA: Insufficient documentation

## 2019-08-02 LAB — IRON AND TIBC
Iron: 22 ug/dL — ABNORMAL LOW (ref 28–170)
Saturation Ratios: 12 % (ref 10.4–31.8)
TIBC: 190 ug/dL — ABNORMAL LOW (ref 250–450)
UIBC: 168 ug/dL

## 2019-08-02 LAB — POCT HEMOGLOBIN-HEMACUE: Hemoglobin: 12.3 g/dL (ref 12.0–15.0)

## 2019-08-02 LAB — PROTIME-INR
INR: 1.1 (ref 0.8–1.2)
Prothrombin Time: 14.4 seconds (ref 11.4–15.2)

## 2019-08-02 LAB — FERRITIN: Ferritin: 753 ng/mL — ABNORMAL HIGH (ref 11–307)

## 2019-08-02 MED ORDER — EPOETIN ALFA 40000 UNIT/ML IJ SOLN: 40000.0000 [IU] | INTRAMUSCULAR | Status: DC

## 2019-08-02 MED ORDER — RIVAROXABAN (XARELTO) VTE STARTER PACK (15 & 20 MG): ORAL_TABLET | ORAL | 0 refills | Status: DC

## 2019-08-02 NOTE — Progress Notes (Addendum)
Musculoskeletal Exam  Patient: [name] DOB: 1940-07-18  DOS: 08/02/2019  SUBJECTIVE:  Chief Complaint:   Chief Complaint  Patient presents with  . Leg Pain    cramps in right leg since monday    [name] is a 79 y.o.  female for evaluation and treatment of R calf pain.  Due to COVID-19 pandemic, we are interacting via telephone. I verified patient's ID using 2 identifiers. Patient agreed to proceed with visit via this method. Patient is at home, I am at office. Patient and I are present for visit.   Onset:  4 days ago. No inj or change in activity.  Location: R calf Character:  aching  Progression of issue:  is unchanged Associated symptoms: no bruising, redness, swelling Treatment: to date has been acetaminophen.   Neurovascular symptoms: no +hx of DVT. Coumadin stopped 1 mo ago after GI bleed, has not restarted.   Past Medical History:  Diagnosis Date  . Abdominal wall hernia   . Anemia    "when lupus flares up"  . Anxiety   . Arthritis    "in my joints"  . Atrial fibrillation ([place])   . CAP (community acquired pneumonia) 08/20/2014  . Crohn's disease ([place])   . DVT of leg (deep venous thrombosis) ([place]) 2012-8./13/2013   "have had one in each leg now"  . DVT of lower extremity, bilateral ([place])    "have had them in both legs; last one was on the RLE this year" (04/25/2012)  . GERD (gastroesophageal reflux disease)   . Gout 2016  . History of hiatal hernia   . History of kidney stones   . Hypertension   . Hypothyroidism   . Lower GI bleed 2012  . Numbness and tingling of foot    bilaterally  . OSA on CPAP   . Refusal of blood transfusion for reasons of conscience 12/20/2011   pt is Jehovah's Witness  . Renal cell carcinoma   . Renal disorder    S/P nephrectomy; dialysis; "working fine now" (12/20/11)  . SBO (small bowel obstruction) ([place]) 11/2016  . SLE (systemic lupus erythematosus) ([facility])     Objective: No conversational dyspnea Age appropriate  judgment and insight Nml affect and mood   Assessment:  Right calf pain - Plan: US Venous Img Lower Unilateral Right  Acute deep vein thrombosis (DVT) of proximal vein of both lower extremities ([facility]) - Plan: Rivaroxaban 15 & 20 MG TBPK  Plan: Doppler today. Tylenol if neg. Anticoagulation if +.  F/u pending above.  Total time: 14 min The patient voiced understanding and agreement to the plan.  Addendum: Received call that imaging reveals a clot. 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 No dosage adjustment per manufacturer. I did speak with patient. There should be some coverage. I would like her to start this and follow up with her PCP this week to discuss if she should stay on this or transition back to Coumadin. This is at least her second clot and she will likely require lifelong anticoagulation should the benefits outweigh the risks. Extra time spent: 10 min; total time on phone is 24 min.  [place], DO 08/02/19  8:14 PM

## 2019-08-02 NOTE — Addendum Note (Signed)
Addended by: Ames Coupe on: 08/02/2019 08:15 PM   Modules accepted: Orders, Level of Service

## 2019-08-04 LAB — TACROLIMUS LEVEL: Tacrolimus (FK506) - LabCorp: 4.9 ng/mL (ref 2.0–20.0)

## 2019-08-05 ENCOUNTER — Other Ambulatory Visit: Payer: Self-pay

## 2019-08-05 ENCOUNTER — Telehealth: Payer: Self-pay

## 2019-08-06 ENCOUNTER — Other Ambulatory Visit: Payer: Self-pay

## 2019-08-06 ENCOUNTER — Ambulatory Visit (INDEPENDENT_AMBULATORY_CARE_PROVIDER_SITE_OTHER): Payer: Medicare Other | Admitting: Family Medicine

## 2019-08-06 ENCOUNTER — Emergency Department (HOSPITAL_COMMUNITY)
Admission: EM | Admit: 2019-08-06 | Discharge: 2019-08-06 | Disposition: A | Discharge status: Home / Self Care | Payer: Medicare Other | Attending: Emergency Medicine | Admitting: Emergency Medicine

## 2019-08-06 ENCOUNTER — Encounter: Payer: Self-pay | Admitting: Family Medicine

## 2019-08-06 VITALS — BP 128/60 | HR 68 | Temp 97.5°F | Resp 18 | Ht 63.0 in | Wt 135.2 lb

## 2019-08-06 DIAGNOSIS — I129 Hypertensive chronic kidney disease with stage 1 through stage 4 chronic kidney disease, or unspecified chronic kidney disease: Secondary | ICD-10-CM | POA: Insufficient documentation

## 2019-08-06 DIAGNOSIS — K625 Hemorrhage of anus and rectum: Secondary | ICD-10-CM

## 2019-08-06 DIAGNOSIS — N183 Chronic kidney disease, stage 3 unspecified: Secondary | ICD-10-CM | POA: Insufficient documentation

## 2019-08-06 DIAGNOSIS — K644 Residual hemorrhoidal skin tags: Secondary | ICD-10-CM | POA: Insufficient documentation

## 2019-08-06 DIAGNOSIS — Z79899 Other long term (current) drug therapy: Secondary | ICD-10-CM | POA: Diagnosis not present

## 2019-08-06 DIAGNOSIS — Z86718 Personal history of other venous thrombosis and embolism: Secondary | ICD-10-CM | POA: Diagnosis not present

## 2019-08-06 DIAGNOSIS — E039 Hypothyroidism, unspecified: Secondary | ICD-10-CM | POA: Diagnosis not present

## 2019-08-06 DIAGNOSIS — Z85528 Personal history of other malignant neoplasm of kidney: Secondary | ICD-10-CM | POA: Diagnosis not present

## 2019-08-06 DIAGNOSIS — Z7901 Long term (current) use of anticoagulants: Secondary | ICD-10-CM | POA: Diagnosis not present

## 2019-08-06 DIAGNOSIS — Z9189 Other specified personal risk factors, not elsewhere classified: Secondary | ICD-10-CM

## 2019-08-06 DIAGNOSIS — D6832 Hemorrhagic disorder due to extrinsic circulating anticoagulants: Secondary | ICD-10-CM | POA: Diagnosis not present

## 2019-08-06 DIAGNOSIS — I4891 Unspecified atrial fibrillation: Secondary | ICD-10-CM | POA: Diagnosis not present

## 2019-08-06 LAB — CBC
HCT: 43.8 % (ref 36.0–46.0)
Hemoglobin: 12.6 g/dL (ref 12.0–15.0)
MCH: 28.6 pg (ref 26.0–34.0)
MCHC: 28.8 g/dL — ABNORMAL LOW (ref 30.0–36.0)
MCV: 99.5 fL (ref 80.0–100.0)
Platelets: 149 10*3/uL — ABNORMAL LOW (ref 150–400)
RBC: 4.4 MIL/uL (ref 3.87–5.11)
RDW: 16.6 % — ABNORMAL HIGH (ref 11.5–15.5)
WBC: 9.4 10*3/uL (ref 4.0–10.5)
nRBC: 0 % (ref 0.0–0.2)

## 2019-08-06 LAB — COMPREHENSIVE METABOLIC PANEL
ALT: 8 U/L (ref 0–44)
AST: 13 U/L — ABNORMAL LOW (ref 15–41)
Albumin: 3.7 g/dL (ref 3.5–5.0)
Alkaline Phosphatase: 112 U/L (ref 38–126)
Anion gap: 12 (ref 5–15)
BUN: 52 mg/dL — ABNORMAL HIGH (ref 8–23)
CO2: 20 mmol/L — ABNORMAL LOW (ref 22–32)
Calcium: 9.1 mg/dL (ref 8.9–10.3)
Chloride: 104 mmol/L (ref 98–111)
Creatinine, Ser: 2.5 mg/dL — ABNORMAL HIGH (ref 0.44–1.00)
GFR calc Af Amer: 21 mL/min — ABNORMAL LOW (ref >60)
GFR calc non Af Amer: 18 mL/min — ABNORMAL LOW (ref >60)
Glucose, Bld: 159 mg/dL — ABNORMAL HIGH (ref 70–99)
Potassium: 4.3 mmol/L (ref 3.5–5.1)
Sodium: 136 mmol/L (ref 135–145)
Total Bilirubin: 0.3 mg/dL (ref 0.3–1.2)
Total Protein: 6.5 g/dL (ref 6.5–8.1)

## 2019-08-06 LAB — HEMOGLOBIN AND HEMATOCRIT, BLOOD
HCT: 47.8 % — ABNORMAL HIGH (ref 36.0–46.0)
Hemoglobin: 14 g/dL (ref 12.0–15.0)

## 2019-08-06 NOTE — ED Provider Notes (Signed)
[facility] EMERGENCY DEPARTMENT Provider Note   CSN: [id number] Arrival date & time: 08/06/19  1253     History No chief complaint on file.   [name] is a 79 y.o. female.  79 year old female with extensive past medical history below including atrial fibrillation, DVT on Xarelto who presents with concern for bleeding.  Patient was recently restarted on anticoagulation for DVT.  She took her first dose 3 days ago.  Since restarting anticoagulation, she has noticed that her gums bleed easily when she brushes her teeth.  She notes a bad taste in her mouth when she belches.  She states that yesterday, she noticed some bright red blood on the toilet paper when she wiped.  She has not had any large amounts of blood in her stool but she also notes that she takes iron supplementation.  She went to her PCP today where Hemoccult was positive for blood and she was sent to the ED for evaluation.  She has not seen any bleeding from rectum today and does note that she has hemorrhoids.  She takes MiraLAX and senna at home to help with constipation.  She denies any dizziness/lightheadedness, abdominal pain, or other complaints.  The history is provided by the patient.       Past Medical History:  Diagnosis Date  . Abdominal wall hernia   . Anemia    "when lupus flares up"  . Anxiety   . Arthritis    "in my joints"  . Atrial fibrillation ([place])   . CAP (community acquired pneumonia) 08/20/2014  . Crohn's disease ([place])   . DVT of leg (deep venous thrombosis) ([place]) 2012-8./13/2013   "have had one in each leg now"  . DVT of lower extremity, bilateral ([place])    "have had them in both legs; last one was on the RLE this year" (04/25/2012)  . GERD (gastroesophageal reflux disease)   . Gout 2016  . History of hiatal hernia   . History of kidney stones   . Hypertension   . Hypothyroidism   . Lower GI bleed 2012  . Numbness and tingling of foot    bilaterally  . OSA on CPAP     . Refusal of blood transfusion for reasons of conscience 12/20/2011   pt is Jehovah's Witness  . Renal cell carcinoma   . Renal disorder    S/P nephrectomy; dialysis; "working fine now" (12/20/11)  . SBO (small bowel obstruction) ([place]) 11/2016  . SLE (systemic lupus erythematosus) ([place])     Patient Active Problem List   Diagnosis Date Noted  . Hematochezia   . Elevated INR   . Acute lower GI bleeding 06/18/2019  . Lower GI bleeding 06/18/2019  . Acute GI bleeding 02/10/2018  . Lower GI bleed 02/10/2018  . Left knee pain 08/01/2017  . Right shoulder injury, subsequent encounter 07/10/2017  . Right shoulder pain 06/22/2017  . Hyperlipidemia LDL goal <100 06/06/2017  . Chronic pain of right knee 06/06/2017  . Plantar fasciitis 06/06/2017  . Partial small bowel obstruction ([place]) 11/23/2016  . SBO (small bowel obstruction) ([place]) 08/06/2016  . Community acquired pneumonia   . Pleural effusion, left   . Streptococcal pneumonia ([place]) 08/20/2014  . GERD (gastroesophageal reflux disease) 08/20/2014  . SLE (systemic lupus erythematosus) ([place]) 08/20/2014  . OSA (obstructive sleep apnea) 08/20/2014  . Immunosuppressed status ([place]) 08/20/2014  . Sepsis ([place]) 08/20/2014  . Anemia of chronic disease 08/20/2014  . Syncope 08/20/2014  . History of  DVT (deep vein thrombosis) 03/05/2014  . Allergic rhinitis 09/08/2013  . Aortic heart murmur 09/04/2013  . Crohn's disease ([place])   . Renal disorder   . Atrial fibrillation ([place]) 12/20/2011  . CKD (chronic kidney disease) stage 3, GFR 30-59 ml/min (HCC) 12/20/2011  . History of renal transplant 12/20/2011  . HTN (hypertension) 12/20/2011  . Hypothyroidism 12/20/2011    Past Surgical History:  Procedure Laterality Date  . [place]   "found sluggish bone marrow" (04/25/2012)  . BONE MARROW BIOPSY  1993; 1995  . BREAST EXCISIONAL BIOPSY     right  . CATARACT EXTRACTION W/ INTRAOCULAR LENS  IMPLANT, BILATERAL   2005-2010  . CESAREAN SECTION  1984  . CHOLECYSTECTOMY  1995  . COLECTOMY  1998   "removal of abscess" (04/25/2012)  . COLONOSCOPY N/A 08/30/2013   Procedure: COLONOSCOPY;  Surgeon: Winfield Cunas., MD;  Location: [facility];  Service: Endoscopy;  Laterality: N/A;  . COLONOSCOPY WITH PROPOFOL N/A 09/06/2017   Procedure: COLONOSCOPY WITH PROPOFOL;  Surgeon: Laurence Spates, MD;  Location: [facility];  Service: Endoscopy;  Laterality: N/A;  . COLOSTOMY  1998  . COLOSTOMY REVERSAL  1999   "6 months after placed" (04/25/2012)  . EXCISIONAL HEMORRHOIDECTOMY    . INSERTION OF DIALYSIS CATHETER  1988-2006   "peritoneal and hemodialysis; had multiple grafts and fistulas; last graft clotted off 2012"  . NEPHRECTOMY TRANSPLANTED ORGAN  2006   bilaterally  . PARATHYROID IMPLANT REMOVAL Left    removed parathyroid from neck and implanted in arm  . VENA CAVA FILTER PLACEMENT  2012     OB History   No obstetric history on file.     Family History  Problem Relation Age of Onset  . Heart disease Mother   . Hypertension Other        runs in family  . Sleep apnea Sister     Social History   Tobacco Use  . Smoking status: Never Smoker  . Smokeless tobacco: Never Used  Substance Use Topics  . Alcohol use: No  . Drug use: No    Home Medications Prior to Admission medications   Medication Sig Start Date End Date Taking? Authorizing Provider  acetaminophen (TYLENOL) 325 MG tablet Take 325-650 mg by mouth every 6 (six) hours as needed for mild pain.    Yes [provider]  allopurinol (ZYLOPRIM) 300 MG tablet Take 1 tablet (300 mg total) by mouth daily. 06/28/19  Yes Ann Held, DO  Ascorbic Acid (VITAMIN C PO) Take 500 mg by mouth daily.   Yes [provider]  calcitRIOL (ROCALTROL) 0.25 MCG capsule Take 0.5 mcg by mouth daily. Per Pt states two daily.   Yes [provider]  felodipine (PLENDIL) 10 MG 24 hr tablet Take 10 mg by mouth every  evening.    Yes [provider]  furosemide (LASIX) 80 MG tablet Take 0.5 tablets (40 mg total) by mouth daily. 06/23/19  Yes Georgette Shell, MD  gabapentin (NEURONTIN) 100 MG capsule Take 2 capsules (200 mg total) by mouth at bedtime. 07/18/16  Yes Ann Held, DO  Iron, Ferrous Sulfate, 325 (65 Fe) MG TABS Take 325 mg by mouth 2 (two) times daily. 06/23/19  Yes Georgette Shell, MD  levothyroxine (SYNTHROID, LEVOTHROID) 100 MCG tablet Take 1 tablet (100 mcg total) by mouth daily. 07/18/16  Yes Roma Schanz R, DO  Melatonin 10 MG CAPS Take 1 capsule by mouth  at bedtime.   Yes [provider]  metoprolol tartrate (LOPRESSOR) 50 MG tablet Take 50 mg by mouth 2 (two) times daily.   Yes [provider]  mycophenolate (MYFORTIC) 180 MG EC tablet Take 360 mg by mouth 2 (two) times daily.     Yes [provider]  omeprazole (PRILOSEC) 20 MG capsule Take 20 mg by mouth daily. 07/20/16  Yes [provider]  polyethylene glycol (MIRALAX / GLYCOLAX) packet Take 17 g by mouth daily as needed for moderate constipation.    Yes [provider]  Polyethylene Glycol 400 (BLINK TEARS) 0.25 % GEL Place 1 drop into both eyes 2 (two) times daily as needed (for dry eyes).   Yes [provider]  predniSONE (DELTASONE) 5 MG tablet Take 5 mg by mouth daily.    Yes [provider]  Probiotic Product (PROBIOTIC PO) Take 1 tablet by mouth daily.    Yes [provider]  Rivaroxaban 15 & 20 MG TBPK Follow package directions: Take one 15m tablet by mouth twice a day. On day 22, switch to one 263mtablet once a day. Take with food. 08/02/19  Yes Wendling, NiCrosby OysterDO  senna (SENOKOT) 8.6 MG TABS tablet Take 2 tablets (17.2 mg total) by mouth at bedtime. 06/23/19  Yes MaGeorgette ShellMD  tacrolimus (PROGRAF) 1 MG capsule Take 3 mg by mouth 2 (two) times daily.    Yes [provider]  epoetin alfa  (EPOGEN,PROCRIT) 4014431NIT/ML injection Inject 40,000 Units into the skin every 21 ( twenty-one) days.     [provider]  vitamin C (ASCORBIC ACID) 250 MG tablet Take 2 tablets (500 mg total) by mouth daily. Patient not taking: Reported on 08/06/2019 06/23/19   MaGeorgette ShellMD    Allergies    Amoxicillin, Ampicillin, Ciprofloxacin, Erythromycin, Penicillins, Tetracycline, Labetalol, Sulfa antibiotics, and Sulfamethoxazole-trimethoprim  Review of Systems   Review of Systems All other systems reviewed and are negative except that which was mentioned in HPI  Physical Exam Updated Vital Signs BP (!) 168/84 (BP Location: Right Arm)   Pulse 76   Temp 98.2 F (36.8 C) (Oral)   Resp 16   Ht 5' 3"  (1.6 m)   Wt 61.2 kg   LMP 03/05/2014   SpO2 99%   BMI 23.91 kg/m   Physical Exam Vitals and nursing note reviewed.  Constitutional:      General: She is not in acute distress.    Appearance: She is well-developed.  HENT:     Head: Normocephalic and atraumatic.     Mouth/Throat:     Mouth: Mucous membranes are moist.     Pharynx: Oropharynx is clear.  Eyes:     Conjunctiva/sclera: Conjunctivae normal.  Cardiovascular:     Rate and Rhythm: Normal rate and regular rhythm.     Heart sounds: Normal heart sounds. No murmur.  Pulmonary:     Effort: Pulmonary effort is normal.     Breath sounds: Normal breath sounds.  Abdominal:     General: Bowel sounds are normal. There is no distension.     Palpations: Abdomen is soft.     Tenderness: There is no abdominal tenderness.  Genitourinary:    Comments: Non-thombosed external hemorrhoid, no active bleeding Musculoskeletal:     Cervical back: Neck supple.  Skin:    General: Skin is warm and dry.  Neurological:     Mental Status: She is alert and oriented to person, place, and time.  Comments: Fluent speech  Psychiatric:        Judgment: Judgment normal.     ED Results / Procedures / Treatments   Labs (all  labs ordered are listed, but only abnormal results are displayed) Labs Reviewed  COMPREHENSIVE METABOLIC PANEL - Abnormal; Notable for the following components:      Result Value   CO2 20 (*)    Glucose, Bld 159 (*)    BUN 52 (*)    Creatinine, Ser 2.50 (*)    AST 13 (*)    GFR calc non Af Amer 18 (*)    GFR calc Af Amer 21 (*)    All other components within normal limits  CBC - Abnormal; Notable for the following components:   MCHC 28.8 (*)    RDW 16.6 (*)    Platelets 149 (*)    All other components within normal limits  HEMOGLOBIN AND HEMATOCRIT, BLOOD - Abnormal; Notable for the following components:   HCT 47.8 (*)    All other components within normal limits    EKG None  Radiology No results found.  Procedures Procedures (including critical care time)  Medications Ordered in ED Medications - No data to display  ED Course  I have reviewed the triage vital signs and the nursing notes.  Pertinent labs that were available during my care of the patient were reviewed by me and considered in my medical decision making (see chart for details).    MDM Rules/Calculators/A&P                      Well appearing on exam, reassuring VS. No active bleeding.  Lab work today is reassuring with hemoglobin 12.6, repeat 8 hours later due to waiting room time is 14.  Her description sounds like easy mucosal bleeding due to anticoagulant use.  I do not feel she is having acute GI bleed based on her report.  It sounds like she often has problems with constipation partly related to known anal stricture.  I have emphasized the importance of titrating MiraLAX to prevent straining and constipation and prevent bleeding from hemorrhoids.  She will follow up with PCP and understands return precautions. Final Clinical Impression(s) / ED Diagnoses Final diagnoses:  At risk for bleeding related to anticoagulants  External hemorrhoids without complication    Rx / DC Orders ED Discharge Orders      None       Kitai Purdom, Wenda Overland, MD 08/07/19 0004

## 2019-08-06 NOTE — ED Triage Notes (Signed)
Pt dx with DVT and on Xalrelto starting Saturday. Since yesterday, pt endorses bleeding from rectum, gums when she brushes her teeth, and when she burps it tastes like blood. Denies dizziness or lightheadedness. Sts she will refuse blood products.

## 2019-08-06 NOTE — Progress Notes (Signed)
Patient ID: [name], female    DOB: Feb 18, 1941  Age: 79 y.o. MRN: [id number]    Subjective:  Subjective  HPI [name] presents for rectal bleed and bleeding gums since yesterday.     Pt has hx a fib and dvt and was on coumadin -- she had gi bleed 06/2019 and coumadin was stopped ---- -she then developed a dvt and was put on xaralto --- she started it 3/26 and blood started yesterday     She has no cp, no sob, no abd pain  Nausea or vomiting   Review of Systems  Constitutional: Negative for appetite change, diaphoresis, fatigue and unexpected weight change.  HENT:       + metallic taste in her mouth and bleeding gums   Eyes: Negative for pain, redness and visual disturbance.  Respiratory: Negative for cough, chest tightness, shortness of breath and wheezing.   Cardiovascular: Negative for chest pain, palpitations and leg swelling.  Gastrointestinal: Positive for anal bleeding and blood in stool. Negative for abdominal pain, constipation, diarrhea, nausea, rectal pain and vomiting.  Endocrine: Negative for cold intolerance, heat intolerance, polydipsia, polyphagia and polyuria.  Genitourinary: Negative for difficulty urinating, dysuria and frequency.  Neurological: Negative for dizziness, light-headedness, numbness and headaches.    History Past Medical History:  Diagnosis Date  . Abdominal wall hernia   . Anemia    "when lupus flares up"  . Anxiety   . Arthritis    "in my joints"  . Atrial fibrillation ([place])   . CAP (community acquired pneumonia) 08/20/2014  . Crohn's disease ([place])   . DVT of leg (deep venous thrombosis) ([place]) 2012-8./13/2013   "have had one in each leg now"  . DVT of lower extremity, bilateral ([place])    "have had them in both legs; last one was on the RLE this year" (04/25/2012)  . GERD (gastroesophageal reflux disease)   . Gout 2016  . History of hiatal hernia   . History of kidney stones   . Hypertension   . Hypothyroidism   . Lower GI bleed  2012  . Numbness and tingling of foot    bilaterally  . OSA on CPAP   . Refusal of blood transfusion for reasons of conscience 12/20/2011   pt is Jehovah's Witness  . Renal cell carcinoma   . Renal disorder    S/P nephrectomy; dialysis; "working fine now" (12/20/11)  . SBO (small bowel obstruction) ([place]) 11/2016  . SLE (systemic lupus erythematosus) ([place])     She has a past surgical history that includes Cesarean section (1984); Nephrectomy transplanted organ (2006); Colectomy (1998); Excisional hemorrhoidectomy; Cataract extraction w/ intraocular lens  implant, bilateral (2005-2010); Vena cava filter placement (2012); Insertion of dialysis catheter (1950-9326); Cholecystectomy (1995); Colostomy (1998); Colostomy reversal (1999); Bone marrow biopsy (1993; 1995); Abdominal exploration surgery (1995); Parathyroid implant removal (Left); Colonoscopy (N/A, 08/30/2013); Breast excisional biopsy; and Colonoscopy with propofol (N/A, 09/06/2017).   Her family history includes Heart disease in her mother; Hypertension in an other family member; Sleep apnea in her sister.She reports that she has never smoked. She has never used smokeless tobacco. She reports that she does not drink alcohol or use drugs.  Current Outpatient Medications on File Prior to Visit  Medication Sig Dispense Refill  . acetaminophen (TYLENOL) 325 MG tablet Take 650 mg by mouth every 6 (six) hours as needed for mild pain.    Marland Kitchen allopurinol (ZYLOPRIM) 300 MG tablet Take 1 tablet (300 mg total) by mouth  daily. 90 tablet 3  . calcitRIOL (ROCALTROL) 0.25 MCG capsule Take 0.25-0.5 mcg by mouth See admin instructions. Per Pt states two daily.    Marland Kitchen epoetin alfa (EPOGEN,PROCRIT) 63016 UNIT/ML injection Inject 40,000 Units into the skin every 21 ( twenty-one) days.     . felodipine (PLENDIL) 10 MG 24 hr tablet Take 10 mg by mouth daily.    . furosemide (LASIX) 80 MG tablet Take 0.5 tablets (40 mg total) by mouth daily. 30 tablet 2  . gabapentin  (NEURONTIN) 100 MG capsule Take 2 capsules (200 mg total) by mouth at bedtime.    . Iron, Ferrous Sulfate, 325 (65 Fe) MG TABS Take 325 mg by mouth 2 (two) times daily. 30 tablet   . levothyroxine (SYNTHROID, LEVOTHROID) 100 MCG tablet Take 1 tablet (100 mcg total) by mouth daily.    . Melatonin 5 MG CAPS Take 5 mg by mouth daily.     . metoprolol tartrate (LOPRESSOR) 50 MG tablet Take 50 mg by mouth 2 (two) times daily.    . mycophenolate (MYFORTIC) 180 MG EC tablet Take 360 mg by mouth 2 (two) times daily.      Marland Kitchen omeprazole (PRILOSEC) 20 MG capsule Take 20 mg by mouth daily.    . polyethylene glycol (MIRALAX / GLYCOLAX) packet Take 17 g by mouth daily as needed for moderate constipation.     . Polyethylene Glycol 400 (BLINK TEARS) 0.25 % GEL Place 1 drop into both eyes 2 (two) times daily as needed (for dry eyes).    . predniSONE (DELTASONE) 5 MG tablet Take 5 mg by mouth daily.     . Probiotic Product (PROBIOTIC PO) Take 2 tablets by mouth daily.    . Rivaroxaban 15 & 20 MG TBPK Follow package directions: Take one 50m tablet by mouth twice a day. On day 22, switch to one 243mtablet once a day. Take with food. 51 each 0  . senna (SENOKOT) 8.6 MG TABS tablet Take 2 tablets (17.2 mg total) by mouth at bedtime. 120 tablet 0  . tacrolimus (PROGRAF) 1 MG capsule Take 3 mg by mouth 2 (two) times daily.     . vitamin C (ASCORBIC ACID) 250 MG tablet Take 2 tablets (500 mg total) by mouth daily.     No current facility-administered medications on file prior to visit.     Objective:  Objective  Physical Exam Vitals and nursing note reviewed.  Constitutional:      Appearance: She is well-developed.  HENT:     Head: Normocephalic and atraumatic.  Eyes:     Conjunctiva/sclera: Conjunctivae normal.  Neck:     Thyroid: No thyromegaly.     Vascular: No carotid bruit or JVD.  Cardiovascular:     Rate and Rhythm: Normal rate and regular rhythm.     Heart sounds: Normal heart sounds. No murmur.    Pulmonary:     Effort: Pulmonary effort is normal. No respiratory distress.     Breath sounds: Normal breath sounds. No wheezing or rales.  Chest:     Chest wall: No tenderness.  Genitourinary:    Rectum: Guaiac result positive.  Musculoskeletal:     Cervical back: Normal range of motion and neck supple.  Neurological:     Mental Status: She is alert and oriented to person, place, and time.    BP 128/60 (BP Location: Right Arm, Patient Position: Sitting, Cuff Size: Normal)   Pulse 68   Temp (!) 97.5 F (36.4 C) (Temporal)  Resp 18   Ht 5' 3"  (1.6 m)   Wt 135 lb 3.2 oz (61.3 kg)   LMP 03/05/2014   SpO2 97%   BMI 23.95 kg/m  Wt Readings from Last 3 Encounters:  08/06/19 135 lb (61.2 kg)  08/06/19 135 lb 3.2 oz (61.3 kg)  07/19/19 139 lb (63 kg)     Lab Results  Component Value Date   WBC 9.4 08/06/2019   HGB 12.6 08/06/2019   HCT 43.8 08/06/2019   PLT 149 (L) 08/06/2019   GLUCOSE 159 (H) 08/06/2019   CHOL 164 03/22/2019   TRIG 161.0 (H) 03/22/2019   HDL 48.30 03/22/2019   LDLCALC 84 03/22/2019   ALT 8 08/06/2019   AST 13 (L) 08/06/2019   NA 136 08/06/2019   K 4.3 08/06/2019   CL 104 08/06/2019   CREATININE 2.50 (H) 08/06/2019   BUN 52 (H) 08/06/2019   CO2 20 (L) 08/06/2019   TSH 0.69 03/22/2019   INR 1.1 08/02/2019   HGBA1C 6.5 (H) 11/12/2014    US Venous Img Lower Unilateral Right  Result Date: 08/02/2019 CLINICAL DATA:  Right posterior knee a and calf pain. EXAM: RIGHT LOWER EXTREMITY VENOUS DOPPLER ULTRASOUND TECHNIQUE: Gray-scale sonography with graded compression, as well as color Doppler and duplex ultrasound were performed to evaluate the lower extremity deep venous systems from the level of the common femoral vein and including the common femoral, femoral, profunda femoral, popliteal and calf veins including the posterior tibial, peroneal and gastrocnemius veins when visible. The superficial great saphenous vein was also interrogated. Spectral Doppler  was utilized to evaluate flow at rest and with distal augmentation maneuvers in the common femoral, femoral and popliteal veins. COMPARISON:  None. FINDINGS: Contralateral Common Femoral Vein: There is occlusive and near occlusive thrombus in the left common femoral vein. Common Femoral Vein: There is occlusive and near occlusive thrombus throughout the right common femoral vein. Saphenofemoral Junction: There is some nonocclusive thrombus at the right septal femoral junction. Profunda Femoral Vein: No evidence of thrombus. Normal compressibility and flow on color Doppler imaging. Femoral Vein: There is occlusive thrombus throughout the right femoral vein. Popliteal Vein: There is occlusive thrombus throughout the right popliteal vein. Calf Veins: The posterior tibial vein is patent. There is occlusive thrombus in the peroneal vein. There is occlusive thrombus in the gastrocnemius vein. Superficial Great Saphenous Vein: No evidence of thrombus. Normal compressibility. Venous Reflux:  None. Other Findings:  None. IMPRESSION: 1. Extensive occlusive and near occlusive thrombus throughout the right lower extremity from the right common femoral vein to the tibial veins as detailed above. 2. Incidentally noted occlusive and near occlusive thrombus in the contralateral left common femoral vein. The entirety of the left lower extremity was not evaluated on this study. These results will be called to the ordering clinician or representative by the Radiologist Assistant, and communication documented in the PACS or [employer]. Electronically Signed   By: Constance Holster M.D.   On: 08/02/2019 19:54     Assessment & Plan:  Plan  I am having [name] maintain her calcitRIOL, polyethylene glycol, mycophenolate, predniSONE, epoetin alfa, tacrolimus, acetaminophen, gabapentin, levothyroxine, omeprazole, metoprolol tartrate, Probiotic Product (PROBIOTIC PO), Polyethylene Glycol 400, Melatonin, felodipine,  furosemide, Iron (Ferrous Sulfate), senna, vitamin C, allopurinol, and Rivaroxaban.  No orders of the defined types were placed in this encounter.   Problem List Items Addressed This Visit    None    Visit Diagnoses    Rectal bleeding    -  Primary    pt was sent to ER    Follow-up: No follow-ups on file.  Ann Held, DO

## 2019-08-07 ENCOUNTER — Telehealth: Payer: Self-pay

## 2019-08-07 NOTE — Telephone Encounter (Signed)
Patient physical therapist Cecelia  called in to see if Dr. Etter Sjogren would like to continue  Physical therapy for the patient sense the patient had blood clots in her legs. Please give Cecelia a call back at [phone]

## 2019-08-08 NOTE — Telephone Encounter (Signed)
Hold off for now

## 2019-08-08 NOTE — Telephone Encounter (Signed)
Please advise 

## 2019-08-09 ENCOUNTER — Encounter (HOSPITAL_COMMUNITY): Payer: Medicare Other

## 2019-08-12 ENCOUNTER — Other Ambulatory Visit: Payer: Self-pay

## 2019-08-13 ENCOUNTER — Ambulatory Visit (INDEPENDENT_AMBULATORY_CARE_PROVIDER_SITE_OTHER): Payer: Medicare Other | Admitting: Family Medicine

## 2019-08-13 ENCOUNTER — Other Ambulatory Visit: Payer: Self-pay

## 2019-08-13 VITALS — BP 150/68 | HR 70 | Temp 97.1°F | Resp 18 | Ht 63.0 in | Wt 135.4 lb

## 2019-08-13 DIAGNOSIS — I82401 Acute embolism and thrombosis of unspecified deep veins of right lower extremity: Secondary | ICD-10-CM

## 2019-08-13 DIAGNOSIS — K644 Residual hemorrhoidal skin tags: Secondary | ICD-10-CM | POA: Diagnosis not present

## 2019-08-13 DIAGNOSIS — K922 Gastrointestinal hemorrhage, unspecified: Secondary | ICD-10-CM

## 2019-08-13 NOTE — Progress Notes (Signed)
Patient ID: [name], female    DOB: [birth year]  Age: 79 y.o. MRN: [id number]    Subjective:  Subjective  HPI [name] presents for f/u ed.   She has had no more bleeding in gums or rectals bleeding.  She is using witch hazel on hemorrhoid and is has shrunk.   She is still on her xaralto.   She has hx of gi bleed and coumadin was stopped  dvt dx 3/26 and xaralto started  --- she started having bleeding gums and rectal bleeding. She was sent to ER for eval and only bleeding hemorrhoid was found.  Pt has been using witch hazel since.   Review of Systems  Constitutional: Negative for appetite change, diaphoresis, fatigue and unexpected weight change.  Eyes: Negative for pain, redness and visual disturbance.  Respiratory: Negative for cough, chest tightness, shortness of breath and wheezing.   Cardiovascular: Negative for chest pain, palpitations and leg swelling.  Endocrine: Negative for cold intolerance, heat intolerance, polydipsia, polyphagia and polyuria.  Genitourinary: Negative for difficulty urinating, dysuria and frequency.  Neurological: Negative for dizziness, light-headedness, numbness and headaches.    History Past Medical History:  Diagnosis Date  . Abdominal wall hernia   . Anemia    "when lupus flares up"  . Anxiety   . Arthritis    "in my joints"  . Atrial fibrillation ([place])   . CAP (community acquired pneumonia) 08/20/2014  . Crohn's disease ([place])   . DVT of leg (deep venous thrombosis) ([place]) 2012-8./13/2013   "have had one in each leg now"  . DVT of lower extremity, bilateral ([place])    "have had them in both legs; last one was on the RLE this year" (04/25/2012)  . GERD (gastroesophageal reflux disease)   . Gout 2016  . History of hiatal hernia   . History of kidney stones   . Hypertension   . Hypothyroidism   . Lower GI bleed 2012  . Numbness and tingling of foot    bilaterally  . OSA on CPAP   . Refusal of blood transfusion for reasons of  conscience 12/20/2011   pt is Jehovah's Witness  . Renal cell carcinoma   . Renal disorder    S/P nephrectomy; dialysis; "working fine now" (12/20/11)  . SBO (small bowel obstruction) ([place]) 11/2016  . SLE (systemic lupus erythematosus) ([place])     She has a past surgical history that includes Cesarean section (1984); Nephrectomy transplanted organ (2006); Colectomy (1998); Excisional hemorrhoidectomy; Cataract extraction w/ intraocular lens  implant, bilateral (2005-2010); Vena cava filter placement (2012); Insertion of dialysis catheter (3016-0109); Cholecystectomy (1995); Colostomy (1998); Colostomy reversal (1999); Bone marrow biopsy (1993; 1995); Abdominal exploration surgery (1995); Parathyroid implant removal (Left); Colonoscopy (N/A, 08/30/2013); Breast excisional biopsy; and Colonoscopy with propofol (N/A, 09/06/2017).   Her family history includes Heart disease in her mother; Hypertension in an other family member; Sleep apnea in her sister.She reports that she has never smoked. She has never used smokeless tobacco. She reports that she does not drink alcohol or use drugs.  Current Outpatient Medications on File Prior to Visit  Medication Sig Dispense Refill  . acetaminophen (TYLENOL) 325 MG tablet Take 325-650 mg by mouth every 6 (six) hours as needed for mild pain.     Marland Kitchen allopurinol (ZYLOPRIM) 300 MG tablet Take 1 tablet (300 mg total) by mouth daily. 90 tablet 3  . Ascorbic Acid (VITAMIN C PO) Take 500 mg by mouth daily.    . calcitRIOL (  ROCALTROL) 0.25 MCG capsule Take 0.5 mcg by mouth daily. Per Pt states two daily.    Marland Kitchen epoetin alfa (EPOGEN,PROCRIT) 56433 UNIT/ML injection Inject 40,000 Units into the skin every 21 ( twenty-one) days.     . felodipine (PLENDIL) 10 MG 24 hr tablet Take 10 mg by mouth every evening.     . furosemide (LASIX) 80 MG tablet Take 0.5 tablets (40 mg total) by mouth daily. 30 tablet 2  . gabapentin (NEURONTIN) 100 MG capsule Take 2 capsules (200 mg total) by  mouth at bedtime.    . Iron, Ferrous Sulfate, 325 (65 Fe) MG TABS Take 325 mg by mouth 2 (two) times daily. 30 tablet   . levothyroxine (SYNTHROID, LEVOTHROID) 100 MCG tablet Take 1 tablet (100 mcg total) by mouth daily.    . Melatonin 10 MG CAPS Take 1 capsule by mouth at bedtime.    . metoprolol tartrate (LOPRESSOR) 50 MG tablet Take 50 mg by mouth 2 (two) times daily.    . mycophenolate (MYFORTIC) 180 MG EC tablet Take 360 mg by mouth 2 (two) times daily.      Marland Kitchen omeprazole (PRILOSEC) 20 MG capsule Take 20 mg by mouth daily.    . polyethylene glycol (MIRALAX / GLYCOLAX) packet Take 17 g by mouth daily as needed for moderate constipation.     . Polyethylene Glycol 400 (BLINK TEARS) 0.25 % GEL Place 1 drop into both eyes 2 (two) times daily as needed (for dry eyes).    . predniSONE (DELTASONE) 5 MG tablet Take 5 mg by mouth daily.     . Probiotic Product (PROBIOTIC PO) Take 1 tablet by mouth daily.     . Rivaroxaban 15 & 20 MG TBPK Follow package directions: Take one 64m tablet by mouth twice a day. On day 22, switch to one 288mtablet once a day. Take with food. 51 each 0  . senna (SENOKOT) 8.6 MG TABS tablet Take 2 tablets (17.2 mg total) by mouth at bedtime. 120 tablet 0  . tacrolimus (PROGRAF) 1 MG capsule Take 3 mg by mouth 2 (two) times daily.     . vitamin C (ASCORBIC ACID) 250 MG tablet Take 2 tablets (500 mg total) by mouth daily.     No current facility-administered medications on file prior to visit.     Objective:  Objective  Physical Exam Vitals and nursing note reviewed.  Constitutional:      Appearance: She is well-developed.  HENT:     Head: Normocephalic and atraumatic.  Eyes:     Conjunctiva/sclera: Conjunctivae normal.  Neck:     Thyroid: No thyromegaly.     Vascular: No carotid bruit or JVD.  Cardiovascular:     Rate and Rhythm: Normal rate and regular rhythm.     Heart sounds: Normal heart sounds. No murmur.  Pulmonary:     Effort: Pulmonary effort is normal.  No respiratory distress.     Breath sounds: Normal breath sounds. No wheezing or rales.  Chest:     Chest wall: No tenderness.  Musculoskeletal:     Cervical back: Normal range of motion and neck supple.  Neurological:     Mental Status: She is alert and oriented to person, place, and time.    BP (!) 150/68 (BP Location: Right Arm, Patient Position: Sitting, Cuff Size: Normal)   Pulse 70   Temp (!) 97.1 F (36.2 C) (Temporal)   Resp 18   Ht 5' 3"  (1.6 m)   Wt 135  lb 6.4 oz (61.4 kg)   LMP 03/05/2014   SpO2 98%   BMI 23.99 kg/m  Wt Readings from Last 3 Encounters:  08/13/19 135 lb 6.4 oz (61.4 kg)  08/06/19 135 lb (61.2 kg)  08/06/19 135 lb 3.2 oz (61.3 kg)     Lab Results  Component Value Date   WBC 9.4 08/06/2019   HGB 14.0 08/06/2019   HCT 47.8 (H) 08/06/2019   PLT 149 (L) 08/06/2019   GLUCOSE 159 (H) 08/06/2019   CHOL 164 03/22/2019   TRIG 161.0 (H) 03/22/2019   HDL 48.30 03/22/2019   LDLCALC 84 03/22/2019   ALT 8 08/06/2019   AST 13 (L) 08/06/2019   NA 136 08/06/2019   K 4.3 08/06/2019   CL 104 08/06/2019   CREATININE 2.50 (H) 08/06/2019   BUN 52 (H) 08/06/2019   CO2 20 (L) 08/06/2019   TSH 0.69 03/22/2019   INR 1.1 08/02/2019   HGBA1C 6.5 (H) 11/12/2014    No results found.   Assessment & Plan:  Plan  I am having Breeonna D. Gong maintain her calcitRIOL, polyethylene glycol, mycophenolate, predniSONE, epoetin alfa, tacrolimus, acetaminophen, gabapentin, levothyroxine, omeprazole, metoprolol tartrate, Probiotic Product (PROBIOTIC PO), Polyethylene Glycol 400, felodipine, furosemide, Iron (Ferrous Sulfate), senna, vitamin C, allopurinol, Rivaroxaban, Melatonin, and Ascorbic Acid (VITAMIN C PO).  No orders of the defined types were placed in this encounter.   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  Ann Held, DO

## 2019-08-13 NOTE — Patient Instructions (Signed)

## 2019-08-14 ENCOUNTER — Encounter: Payer: Self-pay | Admitting: Family Medicine

## 2019-08-14 ENCOUNTER — Telehealth: Payer: Self-pay | Admitting: Family

## 2019-08-14 ENCOUNTER — Other Ambulatory Visit (HOSPITAL_COMMUNITY): Payer: Self-pay

## 2019-08-14 DIAGNOSIS — K644 Residual hemorrhoidal skin tags: Secondary | ICD-10-CM | POA: Insufficient documentation

## 2019-08-14 DIAGNOSIS — M109 Gout, unspecified: Secondary | ICD-10-CM | POA: Diagnosis not present

## 2019-08-14 DIAGNOSIS — I82401 Acute embolism and thrombosis of unspecified deep veins of right lower extremity: Secondary | ICD-10-CM | POA: Insufficient documentation

## 2019-08-14 DIAGNOSIS — K439 Ventral hernia without obstruction or gangrene: Secondary | ICD-10-CM | POA: Diagnosis not present

## 2019-08-14 DIAGNOSIS — K5090 Crohn's disease, unspecified, without complications: Secondary | ICD-10-CM | POA: Diagnosis not present

## 2019-08-14 DIAGNOSIS — I10 Essential (primary) hypertension: Secondary | ICD-10-CM | POA: Diagnosis not present

## 2019-08-14 DIAGNOSIS — K922 Gastrointestinal hemorrhage, unspecified: Secondary | ICD-10-CM | POA: Diagnosis not present

## 2019-08-14 DIAGNOSIS — I4891 Unspecified atrial fibrillation: Secondary | ICD-10-CM | POA: Diagnosis not present

## 2019-08-14 NOTE — Telephone Encounter (Signed)
Justus Memory  [email or web address]  Call Back # [phone]   Requesting a  Update on resuming PT.   Please advise

## 2019-08-14 NOTE — Assessment & Plan Note (Signed)
GI tried to contact pt and was never able to get through  Will place referral again

## 2019-08-14 NOTE — Assessment & Plan Note (Signed)
Controlled with witch hazel and sitz baths

## 2019-08-14 NOTE — Assessment & Plan Note (Addendum)
On xaralto ----- was on coumadin but it had been stopped due to GI bleed Due to gi bleed and recurrent dvt ---most likely will need life long anticoagulation Will refer to hematology

## 2019-08-14 NOTE — Telephone Encounter (Signed)
lmom to inform patient of new appointment 4/19 at 1:30 pm

## 2019-08-14 NOTE — Telephone Encounter (Signed)
Verbal given 

## 2019-08-15 ENCOUNTER — Encounter (HOSPITAL_COMMUNITY): Payer: Self-pay

## 2019-08-15 ENCOUNTER — Inpatient Hospital Stay (HOSPITAL_COMMUNITY): Admission: RE | Admit: 2019-08-15 | Payer: Medicare Other | Source: Ambulatory Visit

## 2019-08-15 DIAGNOSIS — M109 Gout, unspecified: Secondary | ICD-10-CM | POA: Diagnosis not present

## 2019-08-15 DIAGNOSIS — I10 Essential (primary) hypertension: Secondary | ICD-10-CM | POA: Diagnosis not present

## 2019-08-15 DIAGNOSIS — K439 Ventral hernia without obstruction or gangrene: Secondary | ICD-10-CM | POA: Diagnosis not present

## 2019-08-15 DIAGNOSIS — K5090 Crohn's disease, unspecified, without complications: Secondary | ICD-10-CM | POA: Diagnosis not present

## 2019-08-15 DIAGNOSIS — I4891 Unspecified atrial fibrillation: Secondary | ICD-10-CM | POA: Diagnosis not present

## 2019-08-15 DIAGNOSIS — K922 Gastrointestinal hemorrhage, unspecified: Secondary | ICD-10-CM | POA: Diagnosis not present

## 2019-08-16 ENCOUNTER — Encounter (HOSPITAL_COMMUNITY)
Admission: RE | Admit: 2019-08-16 | Discharge: 2019-08-16 | Disposition: A | Discharge status: Home / Self Care | Payer: Medicare Other | Source: Ambulatory Visit | Attending: Nephrology | Admitting: Nephrology

## 2019-08-16 ENCOUNTER — Other Ambulatory Visit: Payer: Self-pay

## 2019-08-16 VITALS — BP 135/73 | HR 62 | Temp 96.5°F | Resp 18 | Ht 63.0 in | Wt 136.0 lb

## 2019-08-16 DIAGNOSIS — N184 Chronic kidney disease, stage 4 (severe): Secondary | ICD-10-CM | POA: Diagnosis not present

## 2019-08-16 DIAGNOSIS — N183 Chronic kidney disease, stage 3 unspecified: Secondary | ICD-10-CM | POA: Diagnosis not present

## 2019-08-16 DIAGNOSIS — D631 Anemia in chronic kidney disease: Secondary | ICD-10-CM | POA: Insufficient documentation

## 2019-08-16 LAB — POCT HEMOGLOBIN-HEMACUE: Hemoglobin: 12.8 g/dL (ref 12.0–15.0)

## 2019-08-16 MED ORDER — SODIUM CHLORIDE 0.9 % IV SOLN
510.0000 mg | INTRAVENOUS | Status: DC
  Administered 2019-08-16: 510 mg via INTRAVENOUS
  Filled 2019-08-16: qty 17

## 2019-08-16 MED ORDER — EPOETIN ALFA 10000 UNIT/ML IJ SOLN: 30000.0000 [IU] | INTRAMUSCULAR | Status: DC

## 2019-08-20 DIAGNOSIS — K439 Ventral hernia without obstruction or gangrene: Secondary | ICD-10-CM | POA: Diagnosis not present

## 2019-08-20 DIAGNOSIS — I10 Essential (primary) hypertension: Secondary | ICD-10-CM | POA: Diagnosis not present

## 2019-08-20 DIAGNOSIS — K922 Gastrointestinal hemorrhage, unspecified: Secondary | ICD-10-CM | POA: Diagnosis not present

## 2019-08-20 DIAGNOSIS — I4891 Unspecified atrial fibrillation: Secondary | ICD-10-CM | POA: Diagnosis not present

## 2019-08-20 DIAGNOSIS — M109 Gout, unspecified: Secondary | ICD-10-CM | POA: Diagnosis not present

## 2019-08-20 DIAGNOSIS — K5090 Crohn's disease, unspecified, without complications: Secondary | ICD-10-CM | POA: Diagnosis not present

## 2019-08-26 ENCOUNTER — Telehealth: Payer: Self-pay

## 2019-08-26 ENCOUNTER — Inpatient Hospital Stay: Payer: Medicare Other

## 2019-08-26 ENCOUNTER — Other Ambulatory Visit: Payer: Self-pay | Admitting: Family

## 2019-08-26 ENCOUNTER — Inpatient Hospital Stay: Payer: Medicare Other | Attending: Family | Admitting: Family

## 2019-08-26 DIAGNOSIS — K922 Gastrointestinal hemorrhage, unspecified: Secondary | ICD-10-CM | POA: Diagnosis not present

## 2019-08-26 DIAGNOSIS — K439 Ventral hernia without obstruction or gangrene: Secondary | ICD-10-CM | POA: Diagnosis not present

## 2019-08-26 DIAGNOSIS — K5090 Crohn's disease, unspecified, without complications: Secondary | ICD-10-CM | POA: Diagnosis not present

## 2019-08-26 DIAGNOSIS — I82401 Acute embolism and thrombosis of unspecified deep veins of right lower extremity: Secondary | ICD-10-CM

## 2019-08-26 DIAGNOSIS — I10 Essential (primary) hypertension: Secondary | ICD-10-CM | POA: Diagnosis not present

## 2019-08-26 DIAGNOSIS — M109 Gout, unspecified: Secondary | ICD-10-CM | POA: Diagnosis not present

## 2019-08-26 DIAGNOSIS — E7211 Homocystinuria: Secondary | ICD-10-CM

## 2019-08-26 DIAGNOSIS — I4891 Unspecified atrial fibrillation: Secondary | ICD-10-CM | POA: Diagnosis not present

## 2019-08-26 DIAGNOSIS — R7983 Abnormal findings of blood amino-acid level: Secondary | ICD-10-CM

## 2019-08-26 NOTE — Telephone Encounter (Signed)
Patient called in to see if DR. Lowne could send in a prescription for  (XARELTO)    Please send it to [id number] [place] [address] [place] [address] Phone:  [phone]:  [phone] DEA #:  [id number]

## 2019-08-26 NOTE — Telephone Encounter (Signed)
It is on her med list0-0---  she sees hematology today -- she should discuss with them

## 2019-08-26 NOTE — Telephone Encounter (Signed)
Not currently on pt med list. Please advise

## 2019-08-27 DIAGNOSIS — K5090 Crohn's disease, unspecified, without complications: Secondary | ICD-10-CM | POA: Diagnosis not present

## 2019-08-27 DIAGNOSIS — M109 Gout, unspecified: Secondary | ICD-10-CM | POA: Diagnosis not present

## 2019-08-27 DIAGNOSIS — K439 Ventral hernia without obstruction or gangrene: Secondary | ICD-10-CM | POA: Diagnosis not present

## 2019-08-27 DIAGNOSIS — K922 Gastrointestinal hemorrhage, unspecified: Secondary | ICD-10-CM | POA: Diagnosis not present

## 2019-08-27 DIAGNOSIS — I4891 Unspecified atrial fibrillation: Secondary | ICD-10-CM | POA: Diagnosis not present

## 2019-08-27 DIAGNOSIS — I10 Essential (primary) hypertension: Secondary | ICD-10-CM | POA: Diagnosis not present

## 2019-08-29 ENCOUNTER — Other Ambulatory Visit: Payer: Self-pay | Admitting: Family Medicine

## 2019-08-29 ENCOUNTER — Encounter (HOSPITAL_COMMUNITY)
Admission: RE | Admit: 2019-08-29 | Discharge: 2019-08-29 | Disposition: A | Discharge status: Home / Self Care | Payer: Medicare Other | Source: Ambulatory Visit | Attending: Nephrology | Admitting: Nephrology

## 2019-08-29 ENCOUNTER — Telehealth: Payer: Self-pay

## 2019-08-29 ENCOUNTER — Encounter (HOSPITAL_COMMUNITY): Payer: Medicare Other

## 2019-08-29 ENCOUNTER — Other Ambulatory Visit: Payer: Self-pay

## 2019-08-29 VITALS — BP 143/70 | HR 67 | Temp 96.8°F | Resp 20

## 2019-08-29 DIAGNOSIS — D631 Anemia in chronic kidney disease: Secondary | ICD-10-CM | POA: Diagnosis not present

## 2019-08-29 DIAGNOSIS — N183 Chronic kidney disease, stage 3 unspecified: Secondary | ICD-10-CM

## 2019-08-29 DIAGNOSIS — I824Y3 Acute embolism and thrombosis of unspecified deep veins of proximal lower extremity, bilateral: Secondary | ICD-10-CM

## 2019-08-29 DIAGNOSIS — N184 Chronic kidney disease, stage 4 (severe): Secondary | ICD-10-CM | POA: Diagnosis not present

## 2019-08-29 LAB — POCT HEMOGLOBIN-HEMACUE: Hemoglobin: 13.3 g/dL (ref 12.0–15.0)

## 2019-08-29 LAB — RENAL FUNCTION PANEL
Albumin: 4.4 g/dL (ref 3.5–5.0)
Anion gap: 11 (ref 5–15)
BUN: 64 mg/dL — ABNORMAL HIGH (ref 8–23)
CO2: 21 mmol/L — ABNORMAL LOW (ref 22–32)
Calcium: 9.6 mg/dL (ref 8.9–10.3)
Chloride: 104 mmol/L (ref 98–111)
Creatinine, Ser: 2.4 mg/dL — ABNORMAL HIGH (ref 0.44–1.00)
GFR calc Af Amer: 22 mL/min — ABNORMAL LOW (ref >60)
GFR calc non Af Amer: 19 mL/min — ABNORMAL LOW (ref >60)
Glucose, Bld: 103 mg/dL — ABNORMAL HIGH (ref 70–99)
Phosphorus: 4.5 mg/dL (ref 2.5–4.6)
Potassium: 4.9 mmol/L (ref 3.5–5.1)
Sodium: 136 mmol/L (ref 135–145)

## 2019-08-29 LAB — PROTIME-INR
INR: 2.9 — ABNORMAL HIGH (ref 0.8–1.2)
Prothrombin Time: 30.6 seconds — ABNORMAL HIGH (ref 11.4–15.2)

## 2019-08-29 MED ORDER — EPOETIN ALFA 10000 UNIT/ML IJ SOLN: 30000.0000 [IU] | INTRAMUSCULAR | Status: DC

## 2019-08-29 MED ORDER — EPOETIN ALFA 10000 UNIT/ML IJ SOLN
INTRAMUSCULAR | Status: AC
  Filled 2019-08-29: qty 1

## 2019-08-29 MED ORDER — SODIUM CHLORIDE 0.9 % IV SOLN
510.0000 mg | INTRAVENOUS | Status: AC
  Administered 2019-08-29: 13:00:00 510 mg via INTRAVENOUS
  Filled 2019-08-29: qty 17

## 2019-08-29 MED ORDER — RIVAROXABAN 20 MG PO TABS: 20.0000 mg | ORAL_TABLET | Freq: Every day | ORAL | 2 refills | Status: DC

## 2019-08-29 MED ORDER — EPOETIN ALFA 20000 UNIT/ML IJ SOLN
INTRAMUSCULAR | Status: AC
  Filled 2019-08-29: qty 1

## 2019-08-29 NOTE — Telephone Encounter (Signed)
Pt was a no show at the hematology appt. Pt states she didn't know about those appointments. Pt states need her med. Please advise

## 2019-08-29 NOTE — Telephone Encounter (Signed)
Pt is calling in regards to status of her medication requesting.

## 2019-08-29 NOTE — Telephone Encounter (Signed)
Patient called in needing to speak with the nurse or Dr. Etter Sjogren about her medications, the patient has some questions and concerns. Please give the patient a call as soon as possible at [phone].

## 2019-08-29 NOTE — Telephone Encounter (Signed)
Pt needs to call hematology and reschedule appointment  Med refilled

## 2019-08-30 NOTE — Telephone Encounter (Signed)
Called patient left message for patient to call the office back  

## 2019-08-30 NOTE — Telephone Encounter (Signed)
Sent patient message to reschedule appointment.

## 2019-09-11 DIAGNOSIS — M109 Gout, unspecified: Secondary | ICD-10-CM | POA: Diagnosis not present

## 2019-09-11 DIAGNOSIS — D631 Anemia in chronic kidney disease: Secondary | ICD-10-CM | POA: Diagnosis not present

## 2019-09-11 DIAGNOSIS — N2581 Secondary hyperparathyroidism of renal origin: Secondary | ICD-10-CM | POA: Diagnosis not present

## 2019-09-11 DIAGNOSIS — Z940 Kidney transplant status: Secondary | ICD-10-CM | POA: Diagnosis not present

## 2019-09-11 DIAGNOSIS — I129 Hypertensive chronic kidney disease with stage 1 through stage 4 chronic kidney disease, or unspecified chronic kidney disease: Secondary | ICD-10-CM | POA: Diagnosis not present

## 2019-09-11 DIAGNOSIS — Z86718 Personal history of other venous thrombosis and embolism: Secondary | ICD-10-CM | POA: Diagnosis not present

## 2019-09-11 DIAGNOSIS — K922 Gastrointestinal hemorrhage, unspecified: Secondary | ICD-10-CM | POA: Diagnosis not present

## 2019-09-11 DIAGNOSIS — M329 Systemic lupus erythematosus, unspecified: Secondary | ICD-10-CM | POA: Diagnosis not present

## 2019-09-11 DIAGNOSIS — I4891 Unspecified atrial fibrillation: Secondary | ICD-10-CM | POA: Diagnosis not present

## 2019-09-11 DIAGNOSIS — N184 Chronic kidney disease, stage 4 (severe): Secondary | ICD-10-CM | POA: Diagnosis not present

## 2019-09-11 DIAGNOSIS — N2589 Other disorders resulting from impaired renal tubular function: Secondary | ICD-10-CM | POA: Diagnosis not present

## 2019-09-12 ENCOUNTER — Encounter (HOSPITAL_COMMUNITY): Payer: Medicare Other

## 2019-09-16 ENCOUNTER — Encounter: Payer: Self-pay | Admitting: Family

## 2019-09-16 ENCOUNTER — Other Ambulatory Visit: Payer: Self-pay

## 2019-09-16 ENCOUNTER — Telehealth: Payer: Self-pay | Admitting: Family

## 2019-09-16 ENCOUNTER — Other Ambulatory Visit: Payer: Self-pay | Admitting: Family

## 2019-09-16 ENCOUNTER — Inpatient Hospital Stay (HOSPITAL_BASED_OUTPATIENT_CLINIC_OR_DEPARTMENT_OTHER): Payer: Medicare Other | Admitting: Family

## 2019-09-16 ENCOUNTER — Inpatient Hospital Stay: Payer: Medicare Other | Attending: Family

## 2019-09-16 VITALS — BP 151/64 | HR 61 | Temp 96.4°F | Resp 17 | Ht 63.0 in | Wt 134.0 lb

## 2019-09-16 DIAGNOSIS — I10 Essential (primary) hypertension: Secondary | ICD-10-CM | POA: Diagnosis not present

## 2019-09-16 DIAGNOSIS — K219 Gastro-esophageal reflux disease without esophagitis: Secondary | ICD-10-CM | POA: Diagnosis not present

## 2019-09-16 DIAGNOSIS — Z940 Kidney transplant status: Secondary | ICD-10-CM | POA: Insufficient documentation

## 2019-09-16 DIAGNOSIS — Z86718 Personal history of other venous thrombosis and embolism: Secondary | ICD-10-CM

## 2019-09-16 DIAGNOSIS — I82412 Acute embolism and thrombosis of left femoral vein: Secondary | ICD-10-CM | POA: Diagnosis not present

## 2019-09-16 DIAGNOSIS — E039 Hypothyroidism, unspecified: Secondary | ICD-10-CM

## 2019-09-16 DIAGNOSIS — E7211 Homocystinuria: Secondary | ICD-10-CM

## 2019-09-16 DIAGNOSIS — R7983 Abnormal findings of blood amino-acid level: Secondary | ICD-10-CM

## 2019-09-16 DIAGNOSIS — M329 Systemic lupus erythematosus, unspecified: Secondary | ICD-10-CM

## 2019-09-16 DIAGNOSIS — Z85528 Personal history of other malignant neoplasm of kidney: Secondary | ICD-10-CM | POA: Diagnosis not present

## 2019-09-16 DIAGNOSIS — K5090 Crohn's disease, unspecified, without complications: Secondary | ICD-10-CM | POA: Insufficient documentation

## 2019-09-16 DIAGNOSIS — Z8249 Family history of ischemic heart disease and other diseases of the circulatory system: Secondary | ICD-10-CM | POA: Insufficient documentation

## 2019-09-16 DIAGNOSIS — Z7901 Long term (current) use of anticoagulants: Secondary | ICD-10-CM | POA: Insufficient documentation

## 2019-09-16 DIAGNOSIS — Z79899 Other long term (current) drug therapy: Secondary | ICD-10-CM | POA: Insufficient documentation

## 2019-09-16 DIAGNOSIS — Z992 Dependence on renal dialysis: Secondary | ICD-10-CM

## 2019-09-16 DIAGNOSIS — Z905 Acquired absence of kidney: Secondary | ICD-10-CM | POA: Diagnosis not present

## 2019-09-16 DIAGNOSIS — G4733 Obstructive sleep apnea (adult) (pediatric): Secondary | ICD-10-CM | POA: Insufficient documentation

## 2019-09-16 DIAGNOSIS — I4891 Unspecified atrial fibrillation: Secondary | ICD-10-CM | POA: Insufficient documentation

## 2019-09-16 DIAGNOSIS — D6859 Other primary thrombophilia: Secondary | ICD-10-CM

## 2019-09-16 DIAGNOSIS — I82401 Acute embolism and thrombosis of unspecified deep veins of right lower extremity: Secondary | ICD-10-CM

## 2019-09-16 DIAGNOSIS — Z7952 Long term (current) use of systemic steroids: Secondary | ICD-10-CM | POA: Insufficient documentation

## 2019-09-16 DIAGNOSIS — M109 Gout, unspecified: Secondary | ICD-10-CM | POA: Insufficient documentation

## 2019-09-16 LAB — CMP (CANCER CENTER ONLY)
ALT: 9 U/L (ref 0–44)
AST: 13 U/L — ABNORMAL LOW (ref 15–41)
Albumin: 4.3 g/dL (ref 3.5–5.0)
Alkaline Phosphatase: 152 U/L — ABNORMAL HIGH (ref 38–126)
Anion gap: 10 (ref 5–15)
BUN: 68 mg/dL — ABNORMAL HIGH (ref 8–23)
CO2: 23 mmol/L (ref 22–32)
Calcium: 9.9 mg/dL (ref 8.9–10.3)
Chloride: 105 mmol/L (ref 98–111)
Creatinine: 2.16 mg/dL — ABNORMAL HIGH (ref 0.44–1.00)
GFR, Est AFR Am: 25 mL/min — ABNORMAL LOW (ref >60)
GFR, Estimated: 22 mL/min — ABNORMAL LOW (ref >60)
Glucose, Bld: 87 mg/dL (ref 70–99)
Potassium: 3.8 mmol/L (ref 3.5–5.1)
Sodium: 138 mmol/L (ref 135–145)
Total Bilirubin: 0.4 mg/dL (ref 0.3–1.2)
Total Protein: 6.8 g/dL (ref 6.5–8.1)

## 2019-09-16 LAB — CBC WITH DIFFERENTIAL (CANCER CENTER ONLY)
Abs Immature Granulocytes: 0.02 10*3/uL (ref 0.00–0.07)
Basophils Absolute: 0.1 10*3/uL (ref 0.0–0.1)
Basophils Relative: 1 %
Eosinophils Absolute: 0.3 10*3/uL (ref 0.0–0.5)
Eosinophils Relative: 3 %
HCT: 38.2 % (ref 36.0–46.0)
Hemoglobin: 11.6 g/dL — ABNORMAL LOW (ref 12.0–15.0)
Immature Granulocytes: 0 %
Lymphocytes Relative: 18 %
Lymphs Abs: 1.7 10*3/uL (ref 0.7–4.0)
MCH: 27.7 pg (ref 26.0–34.0)
MCHC: 30.4 g/dL (ref 30.0–36.0)
MCV: 91.2 fL (ref 80.0–100.0)
Monocytes Absolute: 1.1 10*3/uL — ABNORMAL HIGH (ref 0.1–1.0)
Monocytes Relative: 12 %
Neutro Abs: 6.1 10*3/uL (ref 1.7–7.7)
Neutrophils Relative %: 66 %
Platelet Count: 116 10*3/uL — ABNORMAL LOW (ref 150–400)
RBC: 4.19 MIL/uL (ref 3.87–5.11)
RDW: 15.4 % (ref 11.5–15.5)
WBC Count: 9.3 10*3/uL (ref 4.0–10.5)
nRBC: 0 % (ref 0.0–0.2)

## 2019-09-16 LAB — RETICULOCYTES
Immature Retic Fract: 5.4 % (ref 2.3–15.9)
RBC.: 4.17 MIL/uL (ref 3.87–5.11)
Retic Count, Absolute: 38.4 10*3/uL (ref 19.0–186.0)
Retic Ct Pct: 0.9 % (ref 0.4–3.1)

## 2019-09-16 LAB — D-DIMER, QUANTITATIVE: D-Dimer, Quant: 0.43 ug/mL-FEU (ref 0.00–0.50)

## 2019-09-16 LAB — SAVE SMEAR(SSMR), FOR PROVIDER SLIDE REVIEW

## 2019-09-16 LAB — ANTITHROMBIN III: AntiThromb III Func: 116 % (ref 75–120)

## 2019-09-16 NOTE — Telephone Encounter (Signed)
Called and advised patient of appointments that have been added per 5/10 los.  She stated she will get updates from My Chart

## 2019-09-16 NOTE — Progress Notes (Signed)
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  She developed right leg pain and swelling in both lower extremities in March 2021 and [place] confirmed recurrent DVT involving the right common femoral to tibial veins as well as noted DVT of the left femoral vein. She then started Xarelto. She was not hospitalized.  She had been hospitalized in February for an acute GI bleed while on Coumadin for atrial fib. Her INR was >4 at the time and anticoagulation was stopped.  She notes occasional palpitations with atrial fib.  She is currently wearing compression stockings and will continue to do so during the day for added support. Pedal pulses are 2+.  She stays "cold natured" on anticoagulation.  She denies every being on hormone replacement therapy.  She also has systemic lupus erythematosus. She states that when this flares she has a lot of arthritic pain.  She states that the lupus also lead to kidney failure in the late 1980's. 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 She has done well since then and had no issues.  Prior to transplant she states that she also had history of renal cell carcinoma treated with surgery.  No family history of sickle cell disease or trait that she is aware of.  Maternal grandmother had varicose veins. No family history of thrombus.  She had her mammogram in November 2020 and this was negative. Her last colonoscopy was in May 2019 which showed a  single small angiodysplastic lesion without bleeding in the ascending colon. She is not diabetic.  She has hypothyroidism and is on Synthroid. She states that she had a total parathyroidectomy and implantation in the left arm.  No fever, n/v, cough, rash, dizziness, SOB, chest pain or changes in bladder habits.  She has Crohn's disease and has fluctuated back and forth between diarrhea and constipation in the past. She is currently on a bowel regimen with Senokot which seems to help.  She does have hemorrhoids as well and uses [employer]	[place] and sitz baths as needed.  She has noted noted any recent blood loss. No bruising or petechiae.  No tenderness, numbness or tingling in her extremities.  She is originally from [place] and moved to [place] for a few years before settling back in [place] with her family.  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 She is now retired and stays busy taking care of her home.  She walks for exercise.  She has maintained a good appetite and is staying well hydrated. Her weight is described as stable.  She is not a smoker, no recreational drugs or ETOH.   ROS: All other 10 point review of systems is negative.   PAST MEDICAL HISTORY:   Past Medical History:  Diagnosis Date  . Abdominal wall hernia   . Anemia    "when lupus flares up"  . Anxiety   .  Arthritis    "in my joints"  . Atrial fibrillation ([place])   . CAP (community acquired pneumonia) 08/20/2014  . Crohn's disease ([place])   . DVT of leg (deep venous thrombosis) ([place]) 2012-8./13/2013   "have had one in each leg now"  . DVT of lower extremity, bilateral ([place])    "have had them in both legs; last one was on the RLE this year" (04/25/2012)  . GERD (gastroesophageal reflux disease)   . Gout 2016  . History of hiatal hernia   . History of kidney stones   . Hypertension   . Hypothyroidism   . Lower GI bleed 2012  . Numbness and tingling of foot    bilaterally  . OSA on CPAP   .  Refusal of blood transfusion for reasons of conscience 12/20/2011   pt is Jehovah's Witness  . Renal cell carcinoma   . Renal disorder    S/P nephrectomy; dialysis; "working fine now" (12/20/11)  . SBO (small bowel obstruction) ([place]) 11/2016  . SLE (systemic lupus erythematosus) ([facility])     ALLERGIES: Allergies  Allergen Reactions  . Amoxicillin Anaphylaxis  . Ampicillin Anaphylaxis  . Ciprofloxacin Swelling and Other (See Comments)    "of my throat"  . Erythromycin Anaphylaxis  . Penicillins Anaphylaxis and Other (See Comments)    Has patient had a PCN reaction causing immediate rash, facial/tongue/throat swelling, SOB or lightheadedness with hypotension: Yes Has patient had a PCN reaction causing severe rash involving mucus membranes or skin necrosis: No Has patient had a PCN reaction that required hospitalization: Yes Has patient had a PCN reaction occurring within the last 10 years: Yes If all of the above answers are "NO", then may proceed with Cephalosporin use.  . Tetracycline Anaphylaxis  . Labetalol Nausea And Vomiting  . Sulfa Antibiotics Itching  . Sulfamethoxazole-Trimethoprim Itching      MEDICATIONS:  Current Outpatient Medications on File Prior to Visit  Medication Sig Dispense Refill  . acetaminophen (TYLENOL) 325 MG tablet Take 325-650 mg by mouth every 6 (six) hours as needed for mild pain.     Marland Kitchen allopurinol (ZYLOPRIM) 100 MG tablet Take 200 mg by mouth daily.    Marland Kitchen allopurinol (ZYLOPRIM) 300 MG tablet Take 1 tablet (300 mg total) by mouth daily. 90 tablet 3  . Ascorbic Acid (VITAMIN C PO) Take 500 mg by mouth daily.    . calcitRIOL (ROCALTROL) 0.25 MCG capsule Take 0.5 mcg by mouth daily. Per Pt states two daily.    Marland Kitchen epoetin alfa (EPOGEN,PROCRIT) 16967 UNIT/ML injection Inject 40,000 Units into the skin every 21 ( twenty-one) days.     . felodipine (PLENDIL) 10 MG 24 hr tablet Take 10 mg by mouth every evening.     . furosemide (LASIX) 80 MG tablet Take 0.5  tablets (40 mg total) by mouth daily. 30 tablet 2  . gabapentin (NEURONTIN) 100 MG capsule Take 2 capsules (200 mg total) by mouth at bedtime.    . Iron, Ferrous Sulfate, 325 (65 Fe) MG TABS Take 325 mg by mouth 2 (two) times daily. 30 tablet   . levothyroxine (SYNTHROID, LEVOTHROID) 100 MCG tablet Take 1 tablet (100 mcg total) by mouth daily.    . Melatonin 10 MG CAPS Take 1 capsule by mouth at bedtime.    . metoprolol tartrate (LOPRESSOR) 50 MG tablet Take 50 mg by mouth 2 (two) times daily.    . mycophenolate (MYFORTIC) 180 MG EC tablet Take 360 mg by mouth 2 (two) times daily.      Marland Kitchen  omeprazole (PRILOSEC) 20 MG capsule Take 20 mg by mouth daily.    . polyethylene glycol (MIRALAX / GLYCOLAX) packet Take 17 g by mouth daily as needed for moderate constipation.     . Polyethylene Glycol 400 (BLINK TEARS) 0.25 % GEL Place 1 drop into both eyes 2 (two) times daily as needed (for dry eyes).    . predniSONE (DELTASONE) 5 MG tablet Take 5 mg by mouth daily.     . Probiotic Product (PROBIOTIC PO) Take 1 tablet by mouth daily.     . rivaroxaban (XARELTO) 20 MG TABS tablet Take 1 tablet (20 mg total) by mouth daily with supper. 30 tablet 2  . senna (SENOKOT) 8.6 MG TABS tablet Take 2 tablets (17.2 mg total) by mouth at bedtime. 120 tablet 0  . tacrolimus (PROGRAF) 1 MG capsule Take 3 mg by mouth 2 (two) times daily.     . vitamin C (ASCORBIC ACID) 250 MG tablet Take 2 tablets (500 mg total) by mouth daily.     No current facility-administered medications on file prior to visit.     PAST SURGICAL HISTORY Past Surgical History:  Procedure Laterality Date  . [place]   "found sluggish bone marrow" (04/25/2012)  . BONE MARROW BIOPSY  1993; 1995  . BREAST EXCISIONAL BIOPSY     right  . CATARACT EXTRACTION W/ INTRAOCULAR LENS  IMPLANT, BILATERAL  2005-2010  . CESAREAN SECTION  1984  . CHOLECYSTECTOMY  1995  . COLECTOMY  1998   "removal of abscess" (04/25/2012)  .  COLONOSCOPY N/A 08/30/2013   Procedure: COLONOSCOPY;  Surgeon: Winfield Cunas., MD;  Location: [facility];  Service: Endoscopy;  Laterality: N/A;  . COLONOSCOPY WITH PROPOFOL N/A 09/06/2017   Procedure: COLONOSCOPY WITH PROPOFOL;  Surgeon: Laurence Spates, MD;  Location: [facility];  Service: Endoscopy;  Laterality: N/A;  . COLOSTOMY  1998  . COLOSTOMY REVERSAL  1999   "6 months after placed" (04/25/2012)  . EXCISIONAL HEMORRHOIDECTOMY    . INSERTION OF DIALYSIS CATHETER  1988-2006   "peritoneal and hemodialysis; had multiple grafts and fistulas; last graft clotted off 2012"  . NEPHRECTOMY TRANSPLANTED ORGAN  2006   bilaterally  . PARATHYROID IMPLANT REMOVAL Left    removed parathyroid from neck and implanted in arm  . VENA CAVA FILTER PLACEMENT  2012    FAMILY HISTORY: Family History  Problem Relation Age of Onset  . Heart disease Mother   . Hypertension Other        runs in family  . Sleep apnea Sister     SOCIAL HISTORY:  reports that she has never smoked. She has never used smokeless tobacco. She reports that she does not drink alcohol or use drugs.  PERFORMANCE STATUS: The patient's performance status is 1 - Symptomatic but completely ambulatory  PHYSICAL EXAM: Most Recent Vital Signs: Blood pressure (!) 151/64, pulse 61, temperature (!) 96.4 F (35.8 C), temperature source Temporal, resp. rate 17, height 5' 3" (1.6 m), weight 134 lb (60.8 kg), last menstrual period 03/05/2014, SpO2 100 %. BP (!) 151/64 (Patient Position: Sitting)   Pulse 61   Temp (!) 96.4 F (35.8 C) (Temporal)   Resp 17   Ht 5' 3" (1.6 m)   Wt 134 lb (60.8 kg)   LMP 03/05/2014   SpO2 100%   BMI 23.74 kg/m   General Appearance:    Alert, cooperative, no distress, appears stated age  Head:    Normocephalic, without obvious abnormality,  atraumatic  Eyes:    PERRL, conjunctiva/corneas clear, EOM's intact, fundi    benign, both eyes        Throat:   Lips, mucosa, and tongue normal; teeth  and gums normal  Neck:   Supple, symmetrical, trachea midline, no adenopathy;    thyroid:  no enlargement/tenderness/nodules; no carotid   bruit or JVD  Back:     Symmetric, no curvature, ROM normal, no CVA tenderness  Lungs:     Clear to auscultation bilaterally, respirations unlabored  Chest Wall:    No tenderness or deformity   Heart:    Regular rate and rhythm, S1 and S2 normal, no murmur, rub   or gallop     Abdomen:     Soft, non-tender, bowel sounds active all four quadrants,    no masses, no organomegaly        Extremities:   Extremities normal, atraumatic, no cyanosis or edema  Pulses:   2+ and symmetric all extremities  Skin:   Skin color, texture, turgor normal, no rashes or lesions  Lymph nodes:   Cervical, supraclavicular, and axillary nodes normal  Neurologic:   CNII-XII intact, normal strength, sensation and reflexes    throughout    LABORATORY DATA:  Results for orders placed or performed in visit on 09/16/19 (from the past 48 hour(s))  Save Smear (SSMR)     Status: None   Collection Time: 09/16/19 11:01 AM  Result Value Ref Range   Smear Review SMEAR STAINED AND AVAILABLE FOR REVIEW     Comment: Performed at [facility] at [facility], [address] [place], [place] [address]  CBC with Differential (Cancer Center Only)     Status: Abnormal   Collection Time: 09/16/19 11:01 AM  Result Value Ref Range   WBC Count 9.3 4.0 - 10.5 K/uL   RBC 4.19 3.87 - 5.11 MIL/uL   Hemoglobin 11.6 (L) 12.0 - 15.0 g/dL   HCT 38.2 36.0 - 46.0 %   MCV 91.2 80.0 - 100.0 fL   MCH 27.7 26.0 - 34.0 pg   MCHC 30.4 30.0 - 36.0 g/dL   RDW 15.4 11.5 - 15.5 %   Platelet Count 116 (L) 150 - 400 K/uL   nRBC 0.0 0.0 - 0.2 %   Neutrophils Relative % 66 %   Neutro Abs 6.1 1.7 - 7.7 K/uL   Lymphocytes Relative 18 %   Lymphs Abs 1.7 0.7 - 4.0 K/uL   Monocytes Relative 12 %   Monocytes Absolute 1.1 (H) 0.1 - 1.0 K/uL   Eosinophils Relative 3 %   Eosinophils  Absolute 0.3 0.0 - 0.5 K/uL   Basophils Relative 1 %   Basophils Absolute 0.1 0.0 - 0.1 K/uL   Immature Granulocytes 0 %   Abs Immature Granulocytes 0.02 0.00 - 0.07 K/uL    Comment: Performed at [facility] Lab at [facility], [address] [place], [place] [address]  Reticulocytes     Status: None   Collection Time: 09/16/19 11:03 AM  Result Value Ref Range   Retic Ct Pct 0.9 0.4 - 3.1 %   RBC. 4.17 3.87 - 5.11 MIL/uL   Retic Count, Absolute 38.4 19.0 - 186.0 K/uL   Immature Retic Fract 5.4 2.3 - 15.9 %    Comment: Performed at [facility] at [facility], [address] [place], [place] [address]      RADIOGRAPHY: No results  found.     PATHOLOGY: None  ASSESSMENT/PLAN: Ms. Arambula is a very pleasant 79 yo African American female with history of a left blood clot in the left thigh in 2013 and now recurrent thrombus in both the right and left lower extremities while off Coumadin for GI bleed.  She is tolerating Xarelto nicely and will be on anticoagulation now for life.  She has several autoimmune diseases along with atrial fib which could all certainly contribute to the development of her thrombus.  We will see what her hypercoag panel shows.  We will plan to see her back in 1 month and repeat US of both legs at that time.   All questions were answered and she is in agreement. She will contact our office with any questions or concerns. We can certainly see her sooner if needed.   The patient was discussed with and also seen by Dr. Marin Olp and he is in agreement with the aforementioned.   Laverna Peace    ADDENDUM: I saw and examined Ms. Kimbley with Judson Roch.  I agree with the above assessment.  I have to believe that the fact that she has both Crohn's disease and lupus are probably the main etiologies for this recurrence of her thromboembolic disease.  I does wonder if she has a circulating lupus anticoagulant.  She will  need lifelong anticoagulation.  It sounds like her legs are doing better.  Sounds like the swelling has improved in her legs.  We definitely need to get another Doppler of her legs to see how these thrombi have resolved or improved.  Again, I would not think that she has a hypercoagulable or thrombophilic state.  There is no one in her family that has blood clots.  I do not think malignancy is a problem.  Her kidney cancer was 15 years ago at least.  She has had the kidney transplant.  I see that she is also taking Procrit.  I suppose this also might be considered a risk factor for thromboembolic disease.  Again, she is going to need lifelong anticoagulation.  Her hemoglobin definitely is high enough that she hopefully would not need Procrit all that often.  We spent about 45 minutes with Ms. Buffone and her husband.  They are both very, very nice.  It was incredibly fun talking with him.  I would like to see her back in about 4-6 weeks.  We will get a Doppler of her legs when we see her back to see how the thrombi have responded to Xarelto.  Lattie Haw, MD

## 2019-09-17 ENCOUNTER — Other Ambulatory Visit: Payer: Self-pay | Admitting: Family

## 2019-09-17 DIAGNOSIS — I82401 Acute embolism and thrombosis of unspecified deep veins of right lower extremity: Secondary | ICD-10-CM

## 2019-09-17 DIAGNOSIS — D6859 Other primary thrombophilia: Secondary | ICD-10-CM

## 2019-09-17 LAB — ANTINUCLEAR ANTIBODIES, IFA: ANA Ab, IFA: NEGATIVE

## 2019-09-17 LAB — IRON AND TIBC
Iron: 180 ug/dL — ABNORMAL HIGH (ref 41–142)
Saturation Ratios: 104 % — ABNORMAL HIGH (ref 21–57)
TIBC: 174 ug/dL — ABNORMAL LOW (ref 236–444)
UIBC: UNDETERMINED ug/dL (ref 120–384)

## 2019-09-17 LAB — PROTEIN C ACTIVITY: Protein C Activity: 107 % (ref 73–180)

## 2019-09-17 LAB — LACTATE DEHYDROGENASE: LDH: 186 U/L (ref 98–192)

## 2019-09-17 LAB — PROTEIN S, TOTAL: Protein S Ag, Total: 84 % (ref 60–150)

## 2019-09-17 LAB — FERRITIN: Ferritin: 2565 ng/mL — ABNORMAL HIGH (ref 11–307)

## 2019-09-17 LAB — PROTEIN S ACTIVITY: Protein S Activity: 97 % (ref 63–140)

## 2019-09-17 LAB — PROTEIN C, TOTAL: Protein C, Total: 85 % (ref 60–150)

## 2019-09-18 LAB — BETA-2-GLYCOPROTEIN I ABS, IGG/M/A
Beta-2 Glyco I IgG: <9 GPI IgG units (ref 0–20)
Beta-2-Glycoprotein I IgA: <9 GPI IgA units (ref 0–25)
Beta-2-Glycoprotein I IgM: 129 GPI IgM units — ABNORMAL HIGH (ref 0–32)

## 2019-09-18 LAB — HGB FRACTIONATION CASCADE
Hgb A2: 2.9 % (ref 1.8–3.2)
Hgb A: 97.1 % (ref 96.4–98.8)
Hgb F: 0 % (ref 0.0–2.0)
Hgb S: 0 %

## 2019-09-18 LAB — DRVVT MIX: dRVVT Mix: 60.2 s — ABNORMAL HIGH (ref 0.0–40.4)

## 2019-09-18 LAB — CARDIOLIPIN ANTIBODIES, IGG, IGM, IGA
Anticardiolipin IgA: 17 APL U/mL — ABNORMAL HIGH (ref 0–11)
Anticardiolipin IgG: <9 GPL U/mL (ref 0–14)
Anticardiolipin IgM: >150 MPL U/mL — ABNORMAL HIGH (ref 0–12)

## 2019-09-18 LAB — DRVVT CONFIRM: dRVVT Confirm: 1.8 ratio — ABNORMAL HIGH (ref 0.8–1.2)

## 2019-09-18 LAB — LUPUS ANTICOAGULANT PANEL
DRVVT: 91 s — ABNORMAL HIGH (ref 0.0–47.0)
PTT Lupus Anticoagulant: 45.5 s (ref 0.0–51.9)

## 2019-09-18 LAB — PROTHROMBIN GENE MUTATION

## 2019-09-19 ENCOUNTER — Other Ambulatory Visit: Payer: Self-pay

## 2019-09-19 ENCOUNTER — Encounter (HOSPITAL_COMMUNITY)
Admission: RE | Admit: 2019-09-19 | Discharge: 2019-09-19 | Disposition: A | Discharge status: Home / Self Care | Payer: Medicare Other | Source: Ambulatory Visit | Attending: Nephrology | Admitting: Nephrology

## 2019-09-19 ENCOUNTER — Ambulatory Visit: Payer: Medicare Other | Admitting: Family Medicine

## 2019-09-19 VITALS — BP 131/60 | HR 64 | Temp 95.7°F | Resp 20

## 2019-09-19 DIAGNOSIS — N184 Chronic kidney disease, stage 4 (severe): Secondary | ICD-10-CM | POA: Diagnosis not present

## 2019-09-19 DIAGNOSIS — N183 Chronic kidney disease, stage 3 unspecified: Secondary | ICD-10-CM

## 2019-09-19 DIAGNOSIS — D631 Anemia in chronic kidney disease: Secondary | ICD-10-CM | POA: Insufficient documentation

## 2019-09-19 LAB — FACTOR 5 LEIDEN

## 2019-09-19 LAB — POCT HEMOGLOBIN-HEMACUE: Hemoglobin: 11.6 g/dL — ABNORMAL LOW (ref 12.0–15.0)

## 2019-09-19 LAB — HOMOCYSTEINE: Homocysteine: 32.6 umol/L — ABNORMAL HIGH (ref 0.0–19.2)

## 2019-09-19 MED ORDER — EPOETIN ALFA 10000 UNIT/ML IJ SOLN
INTRAMUSCULAR | Status: AC
  Filled 2019-09-19: qty 1

## 2019-09-19 MED ORDER — EPOETIN ALFA 20000 UNIT/ML IJ SOLN
INTRAMUSCULAR | Status: AC
  Filled 2019-09-19: qty 1

## 2019-09-19 MED ORDER — EPOETIN ALFA 10000 UNIT/ML IJ SOLN
30000.0000 [IU] | INTRAMUSCULAR | Status: DC
  Administered 2019-09-19: 30000 [IU] via SUBCUTANEOUS

## 2019-09-20 ENCOUNTER — Ambulatory Visit (HOSPITAL_BASED_OUTPATIENT_CLINIC_OR_DEPARTMENT_OTHER): Payer: Medicare Other

## 2019-09-20 ENCOUNTER — Telehealth (HOSPITAL_BASED_OUTPATIENT_CLINIC_OR_DEPARTMENT_OTHER): Payer: Medicare Other | Admitting: Family

## 2019-09-20 DIAGNOSIS — D6859 Other primary thrombophilia: Secondary | ICD-10-CM | POA: Diagnosis not present

## 2019-09-20 DIAGNOSIS — E7211 Homocystinuria: Secondary | ICD-10-CM | POA: Diagnosis not present

## 2019-09-20 DIAGNOSIS — I82412 Acute embolism and thrombosis of left femoral vein: Secondary | ICD-10-CM | POA: Diagnosis not present

## 2019-09-20 DIAGNOSIS — N184 Chronic kidney disease, stage 4 (severe): Secondary | ICD-10-CM | POA: Diagnosis not present

## 2019-09-20 DIAGNOSIS — D631 Anemia in chronic kidney disease: Secondary | ICD-10-CM | POA: Diagnosis not present

## 2019-09-20 DIAGNOSIS — R7983 Abnormal findings of blood amino-acid level: Secondary | ICD-10-CM

## 2019-09-20 MED ORDER — FOLIC ACID 1 MG PO TABS: 2.0000 mg | ORAL_TABLET | Freq: Every day | ORAL | 6 refills | Status: AC

## 2019-09-20 MED FILL — Epoetin Alfa Inj 20000 Unit/ML: INTRAMUSCULAR | Qty: 1 | Status: AC

## 2019-09-20 MED FILL — Epoetin Alfa Inj 10000 Unit/ML: INTRAMUSCULAR | Qty: 1 | Status: AC

## 2019-09-20 NOTE — Telephone Encounter (Signed)
I was able to go over all recent lab results with [name]. She was positive for the lupus anticoagulant, IgM anticardiolipin antibodies, hyperhomocystinemia  and elevated iron/ferritin.  We will have her stop her iron supplement. We will check a hemochromatosis DNA at her next visit.  She will start Folic acid 2 mg PO daily.  She will continue Xarelto as prescribed. Patient does not want to go back onto Coumadin due to GI bleed.  She verbalized understanding. No other questions at this time. We will see her again in June for follow-up.

## 2019-09-21 LAB — TACROLIMUS LEVEL: Tacrolimus (FK506) - LabCorp: 5.2 ng/mL (ref 2.0–20.0)

## 2019-10-03 ENCOUNTER — Encounter (HOSPITAL_COMMUNITY)
Admission: RE | Admit: 2019-10-03 | Discharge: 2019-10-03 | Disposition: A | Discharge status: Home / Self Care | Payer: Medicare Other | Source: Ambulatory Visit | Attending: Nephrology | Admitting: Nephrology

## 2019-10-03 VITALS — BP 146/81 | HR 66 | Temp 97.2°F | Resp 20

## 2019-10-03 DIAGNOSIS — N183 Chronic kidney disease, stage 3 unspecified: Secondary | ICD-10-CM

## 2019-10-03 DIAGNOSIS — D631 Anemia in chronic kidney disease: Secondary | ICD-10-CM | POA: Diagnosis not present

## 2019-10-03 DIAGNOSIS — N184 Chronic kidney disease, stage 4 (severe): Secondary | ICD-10-CM | POA: Diagnosis not present

## 2019-10-03 LAB — PROTIME-INR
INR: 1.7 — ABNORMAL HIGH (ref 0.8–1.2)
Prothrombin Time: 19.6 seconds — ABNORMAL HIGH (ref 11.4–15.2)

## 2019-10-03 LAB — IRON AND TIBC
Iron: 88 ug/dL (ref 28–170)
Saturation Ratios: 43 % — ABNORMAL HIGH (ref 10.4–31.8)
TIBC: 203 ug/dL — ABNORMAL LOW (ref 250–450)
UIBC: 115 ug/dL

## 2019-10-03 LAB — RENAL FUNCTION PANEL
Albumin: 3.8 g/dL (ref 3.5–5.0)
Anion gap: 12 (ref 5–15)
BUN: 67 mg/dL — ABNORMAL HIGH (ref 8–23)
CO2: 19 mmol/L — ABNORMAL LOW (ref 22–32)
Calcium: 9.4 mg/dL (ref 8.9–10.3)
Chloride: 105 mmol/L (ref 98–111)
Creatinine, Ser: 2.27 mg/dL — ABNORMAL HIGH (ref 0.44–1.00)
GFR calc Af Amer: 24 mL/min — ABNORMAL LOW (ref >60)
GFR calc non Af Amer: 20 mL/min — ABNORMAL LOW (ref >60)
Glucose, Bld: 103 mg/dL — ABNORMAL HIGH (ref 70–99)
Phosphorus: 4.6 mg/dL (ref 2.5–4.6)
Potassium: 4.5 mmol/L (ref 3.5–5.1)
Sodium: 136 mmol/L (ref 135–145)

## 2019-10-03 LAB — FERRITIN: Ferritin: 1307 ng/mL — ABNORMAL HIGH (ref 11–307)

## 2019-10-03 LAB — POCT HEMOGLOBIN-HEMACUE: Hemoglobin: 11.8 g/dL — ABNORMAL LOW (ref 12.0–15.0)

## 2019-10-03 MED ORDER — EPOETIN ALFA 10000 UNIT/ML IJ SOLN
30000.0000 [IU] | INTRAMUSCULAR | Status: DC
  Administered 2019-10-03: 30000 [IU] via SUBCUTANEOUS

## 2019-10-04 MED FILL — Epoetin Alfa Inj 20000 Unit/ML: INTRAMUSCULAR | Qty: 1 | Status: AC

## 2019-10-04 MED FILL — Epoetin Alfa Inj 10000 Unit/ML: INTRAMUSCULAR | Qty: 1 | Status: AC

## 2019-10-15 ENCOUNTER — Encounter: Payer: Self-pay | Admitting: Family Medicine

## 2019-10-17 ENCOUNTER — Encounter (HOSPITAL_COMMUNITY)
Admission: RE | Admit: 2019-10-17 | Discharge: 2019-10-17 | Disposition: A | Discharge status: Home / Self Care | Payer: Medicare Other | Source: Ambulatory Visit | Attending: Nephrology | Admitting: Nephrology

## 2019-10-17 ENCOUNTER — Other Ambulatory Visit: Payer: Self-pay

## 2019-10-17 VITALS — BP 155/72 | HR 62 | Temp 96.0°F | Resp 20

## 2019-10-17 DIAGNOSIS — N183 Chronic kidney disease, stage 3 unspecified: Secondary | ICD-10-CM | POA: Diagnosis present

## 2019-10-17 LAB — POCT HEMOGLOBIN-HEMACUE: Hemoglobin: 12.1 g/dL (ref 12.0–15.0)

## 2019-10-17 MED ORDER — EPOETIN ALFA 10000 UNIT/ML IJ SOLN: 30000.0000 [IU] | INTRAMUSCULAR | Status: DC

## 2019-10-21 ENCOUNTER — Ambulatory Visit (HOSPITAL_BASED_OUTPATIENT_CLINIC_OR_DEPARTMENT_OTHER)
Admission: RE | Admit: 2019-10-21 | Discharge: 2019-10-21 | Disposition: A | Discharge status: Home / Self Care | Payer: Medicare Other | Source: Ambulatory Visit | Attending: Family | Admitting: Family

## 2019-10-21 ENCOUNTER — Telehealth: Payer: Self-pay | Admitting: *Deleted

## 2019-10-21 ENCOUNTER — Other Ambulatory Visit: Payer: Self-pay

## 2019-10-21 ENCOUNTER — Inpatient Hospital Stay: Payer: Medicare Other

## 2019-10-21 ENCOUNTER — Inpatient Hospital Stay: Payer: Medicare Other | Admitting: Family

## 2019-10-21 DIAGNOSIS — I82401 Acute embolism and thrombosis of unspecified deep veins of right lower extremity: Secondary | ICD-10-CM | POA: Insufficient documentation

## 2019-10-21 DIAGNOSIS — I82412 Acute embolism and thrombosis of left femoral vein: Secondary | ICD-10-CM | POA: Diagnosis present

## 2019-10-21 NOTE — Telephone Encounter (Signed)
Doppler results called from Imaging.  Report taken and given to [facility] NP.  Appt made with patient to review results

## 2019-10-23 ENCOUNTER — Telehealth: Payer: Self-pay | Admitting: Family

## 2019-10-23 ENCOUNTER — Inpatient Hospital Stay: Payer: Medicare Other | Attending: Family

## 2019-10-23 ENCOUNTER — Inpatient Hospital Stay (HOSPITAL_BASED_OUTPATIENT_CLINIC_OR_DEPARTMENT_OTHER): Payer: Medicare Other | Admitting: Family

## 2019-10-23 ENCOUNTER — Encounter: Payer: Self-pay | Admitting: Family

## 2019-10-23 ENCOUNTER — Other Ambulatory Visit: Payer: Self-pay

## 2019-10-23 VITALS — BP 155/79 | HR 89 | Resp 20 | Ht 63.0 in | Wt 135.0 lb

## 2019-10-23 DIAGNOSIS — Z882 Allergy status to sulfonamides status: Secondary | ICD-10-CM | POA: Insufficient documentation

## 2019-10-23 DIAGNOSIS — I82412 Acute embolism and thrombosis of left femoral vein: Secondary | ICD-10-CM

## 2019-10-23 DIAGNOSIS — Z880 Allergy status to penicillin: Secondary | ICD-10-CM | POA: Insufficient documentation

## 2019-10-23 DIAGNOSIS — R768 Other specified abnormal immunological findings in serum: Secondary | ICD-10-CM | POA: Insufficient documentation

## 2019-10-23 DIAGNOSIS — I82403 Acute embolism and thrombosis of unspecified deep veins of lower extremity, bilateral: Secondary | ICD-10-CM | POA: Insufficient documentation

## 2019-10-23 DIAGNOSIS — D6859 Other primary thrombophilia: Secondary | ICD-10-CM

## 2019-10-23 DIAGNOSIS — I82401 Acute embolism and thrombosis of unspecified deep veins of right lower extremity: Secondary | ICD-10-CM | POA: Diagnosis not present

## 2019-10-23 DIAGNOSIS — Z7901 Long term (current) use of anticoagulants: Secondary | ICD-10-CM | POA: Diagnosis not present

## 2019-10-23 DIAGNOSIS — E7211 Homocystinuria: Secondary | ICD-10-CM | POA: Diagnosis not present

## 2019-10-23 DIAGNOSIS — Z881 Allergy status to other antibiotic agents status: Secondary | ICD-10-CM | POA: Diagnosis not present

## 2019-10-23 DIAGNOSIS — D6862 Lupus anticoagulant syndrome: Secondary | ICD-10-CM | POA: Diagnosis not present

## 2019-10-23 LAB — CBC WITH DIFFERENTIAL (CANCER CENTER ONLY)
Abs Immature Granulocytes: 0.02 10*3/uL (ref 0.00–0.07)
Basophils Absolute: 0 10*3/uL (ref 0.0–0.1)
Basophils Relative: 0 %
Eosinophils Absolute: 0.2 10*3/uL (ref 0.0–0.5)
Eosinophils Relative: 2 %
HCT: 39.8 % (ref 36.0–46.0)
Hemoglobin: 12 g/dL (ref 12.0–15.0)
Immature Granulocytes: 0 %
Lymphocytes Relative: 19 %
Lymphs Abs: 1.6 10*3/uL (ref 0.7–4.0)
MCH: 28.1 pg (ref 26.0–34.0)
MCHC: 30.2 g/dL (ref 30.0–36.0)
MCV: 93.2 fL (ref 80.0–100.0)
Monocytes Absolute: 1 10*3/uL (ref 0.1–1.0)
Monocytes Relative: 12 %
Neutro Abs: 5.4 10*3/uL (ref 1.7–7.7)
Neutrophils Relative %: 67 %
Platelet Count: 136 10*3/uL — ABNORMAL LOW (ref 150–400)
RBC: 4.27 MIL/uL (ref 3.87–5.11)
RDW: 16.6 % — ABNORMAL HIGH (ref 11.5–15.5)
WBC Count: 8.2 10*3/uL (ref 4.0–10.5)
nRBC: 0 % (ref 0.0–0.2)

## 2019-10-23 LAB — CMP (CANCER CENTER ONLY)
ALT: 14 U/L (ref 0–44)
AST: 17 U/L (ref 15–41)
Albumin: 4.2 g/dL (ref 3.5–5.0)
Alkaline Phosphatase: 164 U/L — ABNORMAL HIGH (ref 38–126)
Anion gap: 11 (ref 5–15)
BUN: 71 mg/dL — ABNORMAL HIGH (ref 8–23)
CO2: 22 mmol/L (ref 22–32)
Calcium: 9.6 mg/dL (ref 8.9–10.3)
Chloride: 104 mmol/L (ref 98–111)
Creatinine: 2.25 mg/dL — ABNORMAL HIGH (ref 0.44–1.00)
GFR, Est AFR Am: 24 mL/min — ABNORMAL LOW (ref >60)
GFR, Estimated: 21 mL/min — ABNORMAL LOW (ref >60)
Glucose, Bld: 132 mg/dL — ABNORMAL HIGH (ref 70–99)
Potassium: 4.3 mmol/L (ref 3.5–5.1)
Sodium: 137 mmol/L (ref 135–145)
Total Bilirubin: 0.5 mg/dL (ref 0.3–1.2)
Total Protein: 6.9 g/dL (ref 6.5–8.1)

## 2019-10-23 LAB — IRON AND TIBC
Iron: 184 ug/dL — ABNORMAL HIGH (ref 41–142)
Saturation Ratios: 101 % — ABNORMAL HIGH (ref 21–57)
TIBC: 183 ug/dL — ABNORMAL LOW (ref 236–444)
UIBC: UNDETERMINED ug/dL (ref 120–384)

## 2019-10-23 LAB — FERRITIN: Ferritin: 1991 ng/mL — ABNORMAL HIGH (ref 11–307)

## 2019-10-23 NOTE — Progress Notes (Signed)
Hematology and Oncology Follow Up Visit  [name] [id number] [birth year] 79 y.o. 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 She is doing well and has no complaints at this time.  She is tolerating Xarelto nicely. No episodes of bleeding. No bruising or petechiae.  [place] earlier this week showed chronic DVT in the common femoral vein and femoral vein on the right and multiple areas of DVT with what appear to be both chronic and more acute components in the left. There was also felt to be acute thrombus located in the left femoral and popliteal veins.  No fever, chills, n/v, cough, rash, dizziness, SOB, chest pain, palpitations, abdominal pain or changes in bowel or bladder habits.  She has some puffiness around the ankles at baseline. No changes. No pitting edema. Pedal pulses are 2+.  No tenderness, numbness or tingling in her extremities at this time.  No falls or syncopal episodes to report.  She is eating well and drinking lots of fluids. Her weight is stable.   ECOG Performance Status: 1 - Symptomatic but completely ambulatory  Medications:  Allergies as of 10/23/2019      Reactions   Amoxicillin Anaphylaxis   Ampicillin Anaphylaxis   Ciprofloxacin Swelling, Other (See Comments)   "of my throat"   Erythromycin Anaphylaxis   Penicillins Anaphylaxis, Other (See Comments)   Has patient had a PCN reaction causing immediate rash, facial/tongue/throat swelling, SOB or lightheadedness with hypotension: Yes Has patient had a PCN reaction causing severe rash involving mucus membranes or skin necrosis: No Has patient had a PCN reaction that  required hospitalization: Yes Has patient had a PCN reaction occurring within the last 10 years: Yes If all of the above answers are "NO", then may proceed with Cephalosporin use.   Tetracycline Anaphylaxis   Labetalol Nausea And Vomiting   Sulfa Antibiotics Itching   Sulfamethoxazole-trimethoprim Itching      Medication List       Accurate as of October 23, 2019 10:27 AM. If you have any questions, ask your nurse or doctor.        acetaminophen 325 MG tablet Commonly known as: TYLENOL Take 325-650 mg by mouth every 6 (six) hours as needed for mild pain.   allopurinol 300 MG tablet Commonly known as: ZYLOPRIM Take 1 tablet (300 mg total) by mouth daily.   allopurinol 100 MG tablet Commonly known as: ZYLOPRIM Take 200 mg by mouth daily.   Blink Tears 0.25 % Gel Generic drug: Polyethylene Glycol 400 Place 1 drop into both eyes 2 (two) times daily as needed (for dry eyes).   calcitRIOL 0.25 MCG capsule Commonly known as: ROCALTROL Take 0.5 mcg by mouth daily. Per Pt states two daily.   epoetin alfa 40000 UNIT/ML injection Commonly known as: EPOGEN Inject 40,000 Units into the skin every 21 ( twenty-one) days.   felodipine 10 MG 24 hr tablet Commonly known as: PLENDIL Take 10 mg by mouth every evening.   folic acid 1 MG tablet Commonly known as: FOLVITE Take 2 tablets (2 mg total) by mouth daily.   furosemide 80 MG tablet Commonly known as: LASIX Take 0.5 tablets (40 mg total) by mouth daily.  gabapentin 100 MG capsule Commonly known as: NEURONTIN Take 2 capsules (200 mg total) by mouth at bedtime.   levothyroxine 100 MCG tablet Commonly known as: SYNTHROID Take 1 tablet (100 mcg total) by mouth daily.   Melatonin 10 MG Caps Take 1 capsule by mouth at bedtime.   metoprolol tartrate 50 MG tablet Commonly known as: LOPRESSOR Take 50 mg by mouth 2 (two) times daily.   mycophenolate 180 MG EC tablet Commonly known as: MYFORTIC Take 360 mg by mouth 2 (two)  times daily.   omeprazole 20 MG capsule Commonly known as: PRILOSEC Take 20 mg by mouth daily.   polyethylene glycol 17 g packet Commonly known as: MIRALAX / GLYCOLAX Take 17 g by mouth daily as needed for moderate constipation.   predniSONE 5 MG tablet Commonly known as: DELTASONE Take 5 mg by mouth daily.   PROBIOTIC PO Take 1 tablet by mouth daily.   rivaroxaban 20 MG Tabs tablet Commonly known as: XARELTO Take 1 tablet (20 mg total) by mouth daily with supper.   senna 8.6 MG Tabs tablet Commonly known as: SENOKOT Take 2 tablets (17.2 mg total) by mouth at bedtime.   tacrolimus 1 MG capsule Commonly known as: PROGRAF Take 3 mg by mouth 2 (two) times daily.   vitamin C 250 MG tablet Commonly known as: ASCORBIC ACID Take 2 tablets (500 mg total) by mouth daily.   VITAMIN C PO Take 500 mg by mouth daily.       Allergies:  Allergies  Allergen Reactions   Amoxicillin Anaphylaxis   Ampicillin Anaphylaxis   Ciprofloxacin Swelling and Other (See Comments)    "of my throat"   Erythromycin Anaphylaxis   Penicillins Anaphylaxis and Other (See Comments)    Has patient had a PCN reaction causing immediate rash, facial/tongue/throat swelling, SOB or lightheadedness with hypotension: Yes Has patient had a PCN reaction causing severe rash involving mucus membranes or skin necrosis: No Has patient had a PCN reaction that required hospitalization: Yes Has patient had a PCN reaction occurring within the last 10 years: Yes If all of the above answers are "NO", then may proceed with Cephalosporin use.   Tetracycline Anaphylaxis   Labetalol Nausea And Vomiting   Sulfa Antibiotics Itching   Sulfamethoxazole-Trimethoprim Itching    Past Medical History, Surgical history, Social history, and Family History were reviewed and updated.  Review of Systems: All other 10 point review of systems is negative.   Physical Exam:  height is 5' 3"  (1.6 m) and weight is 135 lb  0.6 oz (61.3 kg). Her blood pressure is 155/79 (abnormal) and her pulse is 89. Her respiration is 20 and oxygen saturation is 100%.   Wt Readings from Last 3 Encounters:  10/23/19 135 lb 0.6 oz (61.3 kg)  09/16/19 134 lb (60.8 kg)  08/16/19 136 lb (61.7 kg)    Ocular: Sclerae unicteric, pupils equal, round and reactive to light Ear-nose-throat: Oropharynx clear, dentition fair Lymphatic: No cervical or supraclavicular adenopathy Lungs no rales or rhonchi, good excursion bilaterally Heart regular rate and rhythm, no murmur appreciated Abd soft, nontender, positive bowel sounds, no liver or spleen tip palpated on exam, no fluid wave  MSK no focal spinal tenderness, no joint edema Neuro: non-focal, well-oriented, appropriate affect Breasts: Deferred   Lab Results  Component Value Date   WBC 8.2 10/23/2019   HGB 12.0 10/23/2019   HCT 39.8 10/23/2019   MCV 93.2 10/23/2019   PLT 136 (L) 10/23/2019   Lab Results  Component Value Date   FERRITIN 1,307 (H) 10/03/2019   IRON 88 10/03/2019   TIBC 203 (L) 10/03/2019   UIBC 115 10/03/2019   IRONPCTSAT 43 (H) 10/03/2019   Lab Results  Component Value Date   RETICCTPCT 0.9 09/16/2019   RBC 4.27 10/23/2019   Lab Results  Component Value Date   KAPLAMBRATIO NOT CALC 04/23/2008   No results found for: Kandis Cocking, IGMSERUM Lab Results  Component Value Date   TOTALPROTELP 8.0 04/24/2008   ALBUMINELP 58.0 04/24/2008   A1GS 3.3 04/24/2008   A2GS 8.9 04/24/2008   BETS 4.6 (L) 04/24/2008   BETA2SER 5.2 04/24/2008   GAMS 20.0 (H) 04/24/2008   MSPIKE NOT DETECTED 04/24/2008   SPEI  04/24/2008    (NOTE) Nonspecific diffuse polyclonal type increase in gamma globulins. Reviewed by Odis Hollingshead, MD, PhD, FCAP (Electronic Signature on File)     Chemistry      Component Value Date/Time   NA 136 10/03/2019 1230   K 4.5 10/03/2019 1230   CL 105 10/03/2019 1230   CO2 19 (L) 10/03/2019 1230   BUN 67 (H) 10/03/2019 1230    CREATININE 2.27 (H) 10/03/2019 1230   CREATININE 2.16 (H) 09/16/2019 1101      Component Value Date/Time   CALCIUM 9.4 10/03/2019 1230   CALCIUM 9.2 07/26/2019 1211   ALKPHOS 152 (H) 09/16/2019 1101   AST 13 (L) 09/16/2019 1101   ALT 9 09/16/2019 1101   BILITOT 0.4 09/16/2019 1101       Impression and Plan: [name] is a very pleasant 79 yo African American female with 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 Recent lab work revealed a positive lupus anticoagulant, elevated IgM anticardiolipin antibody, hyperhomocystinemia and elevated iron studies.  Hemochromatosis DNA is pending.  She is on lifelong anticoagulation with Xarelto and tolerating this well. She will continue her same regimen as well as daily folic acid.  We will plan to see her again in another 3 months.  She will contact our office with any questions or concerns. We can certainly see her sooner if needed.   Laverna Peace, NP 6/16/202110:27 AM

## 2019-10-23 NOTE — Telephone Encounter (Signed)
Will call later w/ updated schedule 6/16

## 2019-10-23 NOTE — Telephone Encounter (Signed)
Appointments scheduled calendar printed & mailed per 6/16 los

## 2019-10-28 LAB — HEMOCHROMATOSIS DNA-PCR(C282Y,H63D)

## 2019-10-30 ENCOUNTER — Telehealth: Payer: Self-pay | Admitting: *Deleted

## 2019-10-30 NOTE — Telephone Encounter (Signed)
Pt notified per order of S. [place] NP that "Hemochromatosis DNA is negative and to stop iron supplement d/t high counts."  Pt states that she stopped her oral iron supplement several weeks ago and is appreciative of call. Pt has no questions or concerns at this time.

## 2019-10-30 NOTE — Telephone Encounter (Signed)
-----   Message from Eliezer Bottom, NP sent at 10/29/2019 12:35 PM EDT ----- Hemochromatosis DNA is negative! YAY! If she is taking an iron supplement she can stop it. Her counts are high.   ----- Message ----- From: Interface, Lab In [place] Sent: 10/23/2019  10:16 AM EDT To: Eliezer Bottom, NP

## 2019-10-31 ENCOUNTER — Encounter (HOSPITAL_COMMUNITY): Payer: Medicare Other

## 2019-11-07 ENCOUNTER — Other Ambulatory Visit: Payer: Self-pay

## 2019-11-07 ENCOUNTER — Encounter (HOSPITAL_COMMUNITY)
Admission: RE | Admit: 2019-11-07 | Discharge: 2019-11-07 | Disposition: A | Discharge status: Home / Self Care | Payer: Medicare Other | Source: Ambulatory Visit | Attending: Nephrology | Admitting: Nephrology

## 2019-11-07 VITALS — BP 122/66 | HR 76 | Temp 96.1°F | Resp 18

## 2019-11-07 DIAGNOSIS — D631 Anemia in chronic kidney disease: Secondary | ICD-10-CM | POA: Insufficient documentation

## 2019-11-07 DIAGNOSIS — N184 Chronic kidney disease, stage 4 (severe): Secondary | ICD-10-CM | POA: Diagnosis not present

## 2019-11-07 DIAGNOSIS — N183 Chronic kidney disease, stage 3 unspecified: Secondary | ICD-10-CM

## 2019-11-07 LAB — RENAL FUNCTION PANEL
Albumin: 3.7 g/dL (ref 3.5–5.0)
Anion gap: 10 (ref 5–15)
BUN: 56 mg/dL — ABNORMAL HIGH (ref 8–23)
CO2: 21 mmol/L — ABNORMAL LOW (ref 22–32)
Calcium: 9.1 mg/dL (ref 8.9–10.3)
Chloride: 107 mmol/L (ref 98–111)
Creatinine, Ser: 2.09 mg/dL — ABNORMAL HIGH (ref 0.44–1.00)
GFR calc Af Amer: 26 mL/min — ABNORMAL LOW (ref >60)
GFR calc non Af Amer: 22 mL/min — ABNORMAL LOW (ref >60)
Glucose, Bld: 106 mg/dL — ABNORMAL HIGH (ref 70–99)
Phosphorus: 4.3 mg/dL (ref 2.5–4.6)
Potassium: 4.1 mmol/L (ref 3.5–5.1)
Sodium: 138 mmol/L (ref 135–145)

## 2019-11-07 LAB — IRON AND TIBC
Iron: 157 ug/dL (ref 28–170)
Saturation Ratios: 82 % — ABNORMAL HIGH (ref 10.4–31.8)
TIBC: 192 ug/dL — ABNORMAL LOW (ref 250–450)
UIBC: 35 ug/dL

## 2019-11-07 LAB — PROTIME-INR
INR: 1.6 — ABNORMAL HIGH (ref 0.8–1.2)
Prothrombin Time: 18.5 seconds — ABNORMAL HIGH (ref 11.4–15.2)

## 2019-11-07 LAB — POCT HEMOGLOBIN-HEMACUE: Hemoglobin: 12.6 g/dL (ref 12.0–15.0)

## 2019-11-07 LAB — FERRITIN: Ferritin: 1419 ng/mL — ABNORMAL HIGH (ref 11–307)

## 2019-11-07 MED ORDER — EPOETIN ALFA 10000 UNIT/ML IJ SOLN: 30000.0000 [IU] | INTRAMUSCULAR | Status: DC

## 2019-11-12 ENCOUNTER — Ambulatory Visit: Payer: Medicare Other | Admitting: Family Medicine

## 2019-11-18 NOTE — Progress Notes (Signed)
Subjective:   [name] is a 79 y.o. female who presents for Medicare Annual (Subsequent) preventive examination.  Review of Systems    Cardiac Risk Factors include: advanced age (>7mn, >>41women);hypertension     Objective:    Today's Vitals   11/19/19 0951  BP: (!) 142/63  Pulse: 72  Temp: (!) 97 F (36.1 C)  TempSrc: Temporal  SpO2: 100%  Weight: 137 lb 6.4 oz (62.3 kg)  Height: 5' 3"  (1.6 m)   Body mass index is 24.34 kg/m.  Advanced Directives 11/19/2019 10/23/2019 09/16/2019 06/18/2019 06/18/2019 02/10/2018 09/06/2017  Does Patient Have a Medical Advance Directive? Yes Yes Yes Yes Yes No;Yes Yes  Type of AParamedicof AHightstownLiving will HElmerLiving will HCentreLiving will HNorwalkLiving will HOldsmarLiving will HMiddlesboroughLiving will HCamdenLiving will  Does patient want to make changes to medical advance directive? No - Patient declined - No - Patient declined No - Patient declined - - -  Copy of [place] Chart? No - copy requested No - copy requested - No - copy requested - - No - copy requested  Would patient like information on creating a medical advance directive? - - - - - - -  Pre-existing out of facility DNR order (yellow form or pink MOST form) - - - - - - -    Current Medications (verified) Outpatient Encounter Medications as of 11/19/2019  Medication Sig  . acetaminophen (TYLENOL) 325 MG tablet Take 325-650 mg by mouth every 6 (six) hours as needed for mild pain.   .Marland Kitchenallopurinol (ZYLOPRIM) 300 MG tablet Take 1 tablet (300 mg total) by mouth daily.  . Ascorbic Acid (VITAMIN C PO) Take 500 mg by mouth daily.  . calcitRIOL (ROCALTROL) 0.25 MCG capsule Take 0.5 mcg by mouth daily. Per Pt states two daily.  .Marland Kitchenepoetin alfa (EPOGEN,PROCRIT) 447829UNIT/ML injection Inject 40,000 Units into the  skin every 21 ( twenty-one) days.   . felodipine (PLENDIL) 10 MG 24 hr tablet Take 10 mg by mouth every evening.   . folic acid (FOLVITE) 1 MG tablet Take 2 tablets (2 mg total) by mouth daily.  . furosemide (LASIX) 80 MG tablet Take 0.5 tablets (40 mg total) by mouth daily.  .Marland Kitchengabapentin (NEURONTIN) 100 MG capsule Take 2 capsules (200 mg total) by mouth at bedtime.  .Marland Kitchenlevothyroxine (SYNTHROID, LEVOTHROID) 100 MCG tablet Take 1 tablet (100 mcg total) by mouth daily.  . Melatonin 10 MG CAPS Take 1 capsule by mouth at bedtime.  . metoprolol tartrate (LOPRESSOR) 50 MG tablet Take 50 mg by mouth 2 (two) times daily.  . mycophenolate (MYFORTIC) 180 MG EC tablet Take 360 mg by mouth 2 (two) times daily.    .Marland Kitchenomeprazole (PRILOSEC) 20 MG capsule Take 20 mg by mouth daily.  . polyethylene glycol (MIRALAX / GLYCOLAX) packet Take 17 g by mouth daily as needed for moderate constipation.   . Polyethylene Glycol 400 (BLINK TEARS) 0.25 % GEL Place 1 drop into both eyes 2 (two) times daily as needed (for dry eyes).  . predniSONE (DELTASONE) 5 MG tablet Take 5 mg by mouth daily.   . Probiotic Product (PROBIOTIC PO) Take 1 tablet by mouth daily.   . rivaroxaban (XARELTO) 20 MG TABS tablet Take 1 tablet (20 mg total) by mouth daily with supper.  . senna (SENOKOT) 8.6 MG TABS tablet  Take 2 tablets (17.2 mg total) by mouth at bedtime.  . tacrolimus (PROGRAF) 1 MG capsule Take 3 mg by mouth 2 (two) times daily.   . vitamin C (ASCORBIC ACID) 250 MG tablet Take 2 tablets (500 mg total) by mouth daily.  . [DISCONTINUED] allopurinol (ZYLOPRIM) 100 MG tablet Take 200 mg by mouth daily.   No facility-administered encounter medications on file as of 11/19/2019.    Allergies (verified) Amoxicillin, Ampicillin, Ciprofloxacin, Erythromycin, Penicillins, Tetracycline, Labetalol, Sulfa antibiotics, and Sulfamethoxazole-trimethoprim   History: Past Medical History:  Diagnosis Date  . Abdominal wall hernia   . Anemia     "when lupus flares up"  . Anxiety   . Arthritis    "in my joints"  . Atrial fibrillation ([place])   . CAP (community acquired pneumonia) 08/20/2014  . Crohn's disease ([place])   . DVT of leg (deep venous thrombosis) ([place]) 2012-8./13/2013   "have had one in each leg now"  . DVT of lower extremity, bilateral ([place])    "have had them in both legs; last one was on the RLE this year" (04/25/2012)  . GERD (gastroesophageal reflux disease)   . Gout 2016  . History of hiatal hernia   . History of kidney stones   . Hypertension   . Hypothyroidism   . Lower GI bleed 2012  . Numbness and tingling of foot    bilaterally  . OSA on CPAP   . Refusal of blood transfusion for reasons of conscience 12/20/2011   pt is Jehovah's Witness  . Renal cell carcinoma   . Renal disorder    S/P nephrectomy; dialysis; "working fine now" (12/20/11)  . SBO (small bowel obstruction) ([facility]) 11/2016  . SLE (systemic lupus erythematosus) ([place])    Past Surgical History:  Procedure Laterality Date  . [place]   "found sluggish bone marrow" (04/25/2012)  . BONE MARROW BIOPSY  1993; 1995  . BREAST EXCISIONAL BIOPSY     right  . CATARACT EXTRACTION W/ INTRAOCULAR LENS  IMPLANT, BILATERAL  2005-2010  . CESAREAN SECTION  1984  . CHOLECYSTECTOMY  1995  . COLECTOMY  1998   "removal of abscess" (04/25/2012)  . COLONOSCOPY N/A 08/30/2013   Procedure: COLONOSCOPY;  Surgeon: Winfield Cunas., MD;  Location: [facility];  Service: Endoscopy;  Laterality: N/A;  . COLONOSCOPY WITH PROPOFOL N/A 09/06/2017   Procedure: COLONOSCOPY WITH PROPOFOL;  Surgeon: Laurence Spates, MD;  Location: [facility];  Service: Endoscopy;  Laterality: N/A;  . COLOSTOMY  1998  . COLOSTOMY REVERSAL  1999   "6 months after placed" (04/25/2012)  . EXCISIONAL HEMORRHOIDECTOMY    . INSERTION OF DIALYSIS CATHETER  1988-2006   "peritoneal and hemodialysis; had multiple grafts and fistulas; last graft clotted off 2012"  .  NEPHRECTOMY TRANSPLANTED ORGAN  2006   bilaterally  . PARATHYROID IMPLANT REMOVAL Left    removed parathyroid from neck and implanted in arm  . VENA CAVA FILTER PLACEMENT  2012   Family History  Problem Relation Age of Onset  . Heart disease Mother   . Hypertension Other        runs in family  . Sleep apnea Sister    Social History   Socioeconomic History  . Marital status: Married    Spouse name: Not on file  . Number of children: 3  . Years of education: Not on file  . Highest education level: Not on file  Occupational History  . Occupation: retired-bank Estate agent  Employer: RETIRED  Tobacco Use  . Smoking status: Never Smoker  . Smokeless tobacco: Never Used  Vaping Use  . Vaping Use: Never used  Substance and Sexual Activity  . Alcohol use: No  . Drug use: No  . Sexual activity: Yes    Birth control/protection: Post-menopausal  Other Topics Concern  . Not on file  Social History Narrative  . Not on file   Social Determinants of Health   Financial Resource Strain: Low Risk   . Difficulty of Paying Living Expenses: Not hard at all  Food Insecurity: No Food Insecurity  . Worried About Charity fundraiser in the Last Year: Never true  . Ran Out of Food in the Last Year: Never true  Transportation Needs: No Transportation Needs  . Lack of Transportation (Medical): No  . Lack of Transportation (Non-Medical): No  Physical Activity:   . Days of Exercise per Week:   . Minutes of Exercise per Session:   Stress:   . Feeling of Stress :   Social Connections:   . Frequency of Communication with Friends and Family:   . Frequency of Social Gatherings with Friends and Family:   . Attends Religious Services:   . Active Member of Clubs or Organizations:   . Attends Archivist Meetings:   Marland Kitchen Marital Status:     Tobacco Counseling Counseling given: Not Answered   Clinical Intake: Pain : No/denies pain    Activities of Daily Living In your present state  of health, do you have any difficulty performing the following activities: 11/19/2019 06/18/2019  Hearing? N N  Vision? N N  Difficulty concentrating or making decisions? N N  Walking or climbing stairs? N N  Dressing or bathing? N N  Doing errands, shopping? N N  Preparing Food and eating ? N -  Using the Toilet? N -  In the past six months, have you accidently leaked urine? N -  Do you have problems with loss of bowel control? N -  Managing your Medications? N -  Managing your Finances? N -  Housekeeping or managing your Housekeeping? N -  Some recent data might be hidden    Patient Care Team: Carollee Herter, Alferd Apa, DO as PCP - General (Family Medicine) Deter[place], Jeneen Rinks, MD as Consulting Physician (Nephrology) Deneise Lever, MD as Consulting Physician (Pulmonary Disease) Josue Hector, MD as Consulting Physician (Cardiology) Lavena Bullion, DO as Consulting Physician (Gastroenterology)  Indicate any recent Medical Services you may have received from other than [facility] providers in the past year (date may be approximate).     Assessment:   This is a routine wellness examination for [place].  Dietary issues and exercise activities discussed: Current Exercise Habits: The patient does not participate in regular exercise at present, Exercise limited by: None identified   Diet (meal preparation, eat out, water intake, caffeinated beverages, dairy products, fruits and vegetables): well balanced    Goals    . Increase physical activity     Workout at home 3x/week for 23mnutes       Depression Screen PHQ 2/9 Scores 11/19/2019 12/04/2017 07/22/2016 07/14/2016 06/26/2015  PHQ - 2 Score 0 0 0 0 0  PHQ- 9 Score - 0 - - -    Fall Risk Fall Risk  11/19/2019 07/22/2016 07/14/2016 06/26/2015  Falls in the past year? 0 No No No  Number falls in past yr: 0 - - -  Injury with Fall? 0 - - -  Risk for fall due to : - - Impaired mobility -  Follow up Education provided;Falls prevention  discussed - - -   Lives w/ husband in split level home.  Any stairs in or around the home? Yes  If so, are there any without handrails? No  Home free of loose throw rugs in walkways, pet beds, electrical cords, etc? Yes  Adequate lighting in your home to reduce risk of falls? Yes   ASSISTIVE DEVICES UTILIZED TO PREVENT FALLS:  Life alert? No  Use of a cane, walker or w/c? Yes  Grab bars in the bathroom? Yes  Shower chair or bench in shower? Yes  Elevated toilet seat or a handicapped toilet? Yes  Gait steady and fast without use of assistive device  Cognitive Function: Ad8 score reviewed for issues:  Issues making decisions:no  Less interest in hobbies / activities:no  Repeats questions, stories (family complaining):no  Trouble using ordinary gadgets (microwave, computer, phone):no  Forgets the month or year: no  Mismanaging finances: no  Remembering appts:no  Daily problems with thinking and/or memory:no Ad8 score is=0     MMSE - Mini Mental State Exam 07/22/2016  Orientation to time 5  Orientation to Place 5  Registration 3  Attention/ Calculation 5  Recall 2  Language- name 2 objects 2  Language- repeat 1  Language- follow 3 step command 3  Language- read & follow direction 1  Write a sentence 1  Copy design 1  Total score 29        Immunizations Immunization History  Administered Date(s) Administered  . Influenza Split 03/09/2013, 03/09/2014, 02/06/2017  . Influenza, High Dose Seasonal PF 02/27/2018  . Influenza-Unspecified 03/10/2015  . PFIZER SARS-COV-2 Vaccination 07/13/2019, 08/03/2019  . Pneumococcal Conjugate-13 06/09/2017  . Pneumococcal Polysaccharide-23 12/21/2010     Pneumococcal vaccine status: Up to date Covid-19 vaccine status: Completed vaccines  Qualifies for Shingles Vaccine? Pt states she can not do, due to only 1 kidney.  Screening Tests Health Maintenance  Topic Date Due  . TETANUS/TDAP  Never done  . INFLUENZA VACCINE   12/08/2019  . DEXA SCAN  Completed  . COVID-19 Vaccine  Completed  . Hepatitis C Screening  Completed  . PNA vac Low Risk Adult  Completed    Health Maintenance  Health Maintenance Due  Topic Date Due  . TETANUS/TDAP  Never done    Colorectal cancer screening: Completed 09/06/17. Repeat every (no recall on file) years Mammogram status: Completed 04/03/19. Repeat every year Pt declines bone density.  Lung Cancer Screening: (Low Dose CT Chest recommended if Age 63-80 years, 30 pack-year currently smoking OR have quit w/in 15years.) does not qualify.    Additional Screening:  Hepatitis C Screening: does qualify; Completed 08/22/14  Vision Screening: Recommended annual ophthalmology exams for early detection of glaucoma and other disorders of the eye. Is the patient up to date with their annual eye exam?  Yes  Who is the provider or what is the name of the office in which the patient attends annual eye exams? Dr.Digby   Dental Screening: Recommended annual dental exams for proper oral hygiene  Community Resource Referral / Chronic Care Management: CRR required this visit?  No   CCM required this visit?  No      Plan:    Please schedule your next medicare wellness visit with me in 1 yr.  Continue to eat heart healthy diet (full of fruits, vegetables, whole grains, lean protein, water--limit salt, fat, and sugar intake) and  increase physical activity as tolerated.  Continue doing brain stimulating activities (puzzles, reading, adult coloring books, staying active) to keep memory sharp.   Bring a copy of your living will and/or healthcare power of attorney to your next office visit.    I have personally reviewed and noted the following in the patient's chart:   . Medical and social history . Use of alcohol, tobacco or illicit drugs  . Current medications and supplements . Functional ability and status . Nutritional status . Physical activity . Advanced  directives . List of other physicians . Hospitalizations, surgeries, and ER visits in previous 12 months . Vitals . Screenings to include cognitive, depression, and falls . Referrals and appointments  In addition, I have reviewed and discussed with patient certain preventive protocols, quality metrics, and best practice recommendations. A written personalized care plan for preventive services as well as general preventive health recommendations were provided to patient.     Naaman Plummer [place], [place]   11/19/2019

## 2019-11-19 ENCOUNTER — Encounter: Payer: Self-pay | Admitting: Family Medicine

## 2019-11-19 ENCOUNTER — Ambulatory Visit (INDEPENDENT_AMBULATORY_CARE_PROVIDER_SITE_OTHER): Payer: Medicare Other | Admitting: Family Medicine

## 2019-11-19 ENCOUNTER — Other Ambulatory Visit: Payer: Self-pay

## 2019-11-19 ENCOUNTER — Ambulatory Visit (INDEPENDENT_AMBULATORY_CARE_PROVIDER_SITE_OTHER): Payer: Medicare Other | Admitting: *Deleted

## 2019-11-19 ENCOUNTER — Encounter: Payer: Self-pay | Admitting: *Deleted

## 2019-11-19 VITALS — BP 142/62 | HR 72 | Temp 97.0°F | Ht 63.0 in | Wt 137.6 lb

## 2019-11-19 VITALS — BP 142/63 | HR 72 | Temp 97.0°F | Ht 63.0 in | Wt 137.4 lb

## 2019-11-19 DIAGNOSIS — K644 Residual hemorrhoidal skin tags: Secondary | ICD-10-CM | POA: Diagnosis not present

## 2019-11-19 DIAGNOSIS — I10 Essential (primary) hypertension: Secondary | ICD-10-CM | POA: Diagnosis not present

## 2019-11-19 DIAGNOSIS — Z0000 Encounter for general adult medical examination without abnormal findings: Secondary | ICD-10-CM | POA: Diagnosis not present

## 2019-11-19 DIAGNOSIS — D638 Anemia in other chronic diseases classified elsewhere: Secondary | ICD-10-CM | POA: Diagnosis not present

## 2019-11-19 DIAGNOSIS — I824Y3 Acute embolism and thrombosis of unspecified deep veins of proximal lower extremity, bilateral: Secondary | ICD-10-CM | POA: Diagnosis not present

## 2019-11-19 DIAGNOSIS — K649 Unspecified hemorrhoids: Secondary | ICD-10-CM | POA: Diagnosis not present

## 2019-11-19 DIAGNOSIS — E785 Hyperlipidemia, unspecified: Secondary | ICD-10-CM

## 2019-11-19 DIAGNOSIS — N183 Chronic kidney disease, stage 3 unspecified: Secondary | ICD-10-CM | POA: Diagnosis not present

## 2019-11-19 DIAGNOSIS — E039 Hypothyroidism, unspecified: Secondary | ICD-10-CM | POA: Diagnosis not present

## 2019-11-19 DIAGNOSIS — Z8719 Personal history of other diseases of the digestive system: Secondary | ICD-10-CM | POA: Diagnosis not present

## 2019-11-19 LAB — LIPID PANEL
Cholesterol: 130 mg/dL (ref 0–200)
HDL: 44.1 mg/dL (ref >39.00)
LDL Cholesterol: 52 mg/dL (ref 0–99)
NonHDL: 86.23
Total CHOL/HDL Ratio: 3
Triglycerides: 172 mg/dL — ABNORMAL HIGH (ref 0.0–149.0)
VLDL: 34.4 mg/dL (ref 0.0–40.0)

## 2019-11-19 LAB — TSH: TSH: 0.64 u[IU]/mL (ref 0.35–4.50)

## 2019-11-19 MED ORDER — RIVAROXABAN 20 MG PO TABS: 20.0000 mg | ORAL_TABLET | Freq: Every day | ORAL | 5 refills | Status: AC

## 2019-11-19 NOTE — Patient Instructions (Signed)
DASH Eating Plan DASH stands for "Dietary Approaches to Stop Hypertension." The DASH eating plan is a healthy eating plan that has been shown to reduce high blood pressure (hypertension). It may also reduce your risk for type 2 diabetes, heart disease, and stroke. The DASH eating plan may also help with weight loss. What are tips for following this plan?  General guidelines  Avoid eating more than 2,300 mg (milligrams) of salt (sodium) a day. If you have hypertension, you may need to reduce your sodium intake to 1,500 mg a day.  Limit alcohol intake to no more than 1 drink a day for nonpregnant women and 2 drinks a day for men. One drink equals 12 oz of beer, 5 oz of wine, or 1 oz of hard liquor.  Work with your health care provider to maintain a healthy body weight or to lose weight. Ask what an ideal weight is for you.  Get at least 30 minutes of exercise that causes your heart to beat faster (aerobic exercise) most days of the week. Activities may include walking, swimming, or biking.  Work with your health care provider or diet and nutrition specialist (dietitian) to adjust your eating plan to your individual calorie needs. Reading food labels   Check food labels for the amount of sodium per serving. Choose foods with less than 5 percent of the Daily Value of sodium. Generally, foods with less than 300 mg of sodium per serving fit into this eating plan.  To find whole grains, look for the word "whole" as the first word in the ingredient list. Shopping  Buy products labeled as "low-sodium" or "no salt added."  Buy fresh foods. Avoid canned foods and premade or frozen meals. Cooking  Avoid adding salt when cooking. Use salt-free seasonings or herbs instead of table salt or sea salt. Check with your health care provider or pharmacist before using salt substitutes.  Do not fry foods. Cook foods using healthy methods such as baking, boiling, grilling, and broiling instead.  Cook with  heart-healthy oils, such as olive, canola, soybean, or sunflower oil. Meal planning  Eat a balanced diet that includes: ? 5 or more servings of fruits and vegetables each day. At each meal, try to fill half of your plate with fruits and vegetables. ? Up to 6-8 servings of whole grains each day. ? Less than 6 oz of lean meat, poultry, or fish each day. A 3-oz serving of meat is about the same size as a deck of cards. One egg equals 1 oz. ? 2 servings of low-fat dairy each day. ? A serving of nuts, seeds, or beans 5 times each week. ? Heart-healthy fats. Healthy fats called Omega-3 fatty acids are found in foods such as flaxseeds and coldwater fish, like sardines, salmon, and mackerel.  Limit how much you eat of the following: ? Canned or prepackaged foods. ? Food that is high in trans fat, such as fried foods. ? Food that is high in saturated fat, such as fatty meat. ? Sweets, desserts, sugary drinks, and other foods with added sugar. ? Full-fat dairy products.  Do not salt foods before eating.  Try to eat at least 2 vegetarian meals each week.  Eat more home-cooked food and less restaurant, buffet, and fast food.  When eating at a restaurant, ask that your food be prepared with less salt or no salt, if possible. What foods are recommended? The items listed may not be a complete list. Talk with your dietitian about   what dietary choices are best for you. Grains Whole-grain or whole-wheat bread. Whole-grain or whole-wheat pasta. Brown rice. Oatmeal. Quinoa. Bulgur. Whole-grain and low-sodium cereals. Pita bread. Low-fat, low-sodium crackers. Whole-wheat flour tortillas. Vegetables Fresh or frozen vegetables (raw, steamed, roasted, or grilled). Low-sodium or reduced-sodium tomato and vegetable juice. Low-sodium or reduced-sodium tomato sauce and tomato paste. Low-sodium or reduced-sodium canned vegetables. Fruits All fresh, dried, or frozen fruit. Canned fruit in natural juice (without  added sugar). Meat and other protein foods Skinless chicken or turkey. Ground chicken or turkey. Pork with fat trimmed off. Fish and seafood. Egg whites. Dried beans, peas, or lentils. Unsalted nuts, nut butters, and seeds. Unsalted canned beans. Lean cuts of beef with fat trimmed off. Low-sodium, lean deli meat. Dairy Low-fat (1%) or fat-free (skim) milk. Fat-free, low-fat, or reduced-fat cheeses. Nonfat, low-sodium ricotta or cottage cheese. Low-fat or nonfat yogurt. Low-fat, low-sodium cheese. Fats and oils Soft margarine without trans fats. Vegetable oil. Low-fat, reduced-fat, or light mayonnaise and salad dressings (reduced-sodium). Canola, safflower, olive, soybean, and sunflower oils. Avocado. Seasoning and other foods Herbs. Spices. Seasoning mixes without salt. Unsalted popcorn and pretzels. Fat-free sweets. What foods are not recommended? The items listed may not be a complete list. Talk with your dietitian about what dietary choices are best for you. Grains Baked goods made with fat, such as croissants, muffins, or some breads. Dry pasta or rice meal packs. Vegetables Creamed or fried vegetables. Vegetables in a cheese sauce. Regular canned vegetables (not low-sodium or reduced-sodium). Regular canned tomato sauce and paste (not low-sodium or reduced-sodium). Regular tomato and vegetable juice (not low-sodium or reduced-sodium). Pickles. Olives. Fruits Canned fruit in a light or heavy syrup. Fried fruit. Fruit in cream or butter sauce. Meat and other protein foods Fatty cuts of meat. Ribs. Fried meat. Bacon. Sausage. Bologna and other processed lunch meats. Salami. Fatback. Hotdogs. Bratwurst. Salted nuts and seeds. Canned beans with added salt. Canned or smoked fish. Whole eggs or egg yolks. Chicken or turkey with skin. Dairy Whole or 2% milk, cream, and half-and-half. Whole or full-fat cream cheese. Whole-fat or sweetened yogurt. Full-fat cheese. Nondairy creamers. Whipped toppings.  Processed cheese and cheese spreads. Fats and oils Butter. Stick margarine. Lard. Shortening. Ghee. Bacon fat. Tropical oils, such as coconut, palm kernel, or palm oil. Seasoning and other foods Salted popcorn and pretzels. Onion salt, garlic salt, seasoned salt, table salt, and sea salt. Worcestershire sauce. Tartar sauce. Barbecue sauce. Teriyaki sauce. Soy sauce, including reduced-sodium. Steak sauce. Canned and packaged gravies. Fish sauce. Oyster sauce. Cocktail sauce. Horseradish that you find on the shelf. Ketchup. Mustard. Meat flavorings and tenderizers. Bouillon cubes. Hot sauce and Tabasco sauce. Premade or packaged marinades. Premade or packaged taco seasonings. Relishes. Regular salad dressings. Where to find more information:  National Heart, Lung, and Blood Institute: www.nhlbi.nih.gov  American Heart Association: www.heart.org Summary  The DASH eating plan is a healthy eating plan that has been shown to reduce high blood pressure (hypertension). It may also reduce your risk for type 2 diabetes, heart disease, and stroke.  With the DASH eating plan, you should limit salt (sodium) intake to 2,300 mg a day. If you have hypertension, you may need to reduce your sodium intake to 1,500 mg a day.  When on the DASH eating plan, aim to eat more fresh fruits and vegetables, whole grains, lean proteins, low-fat dairy, and heart-healthy fats.  Work with your health care provider or diet and nutrition specialist (dietitian) to adjust your eating plan to your   individual calorie needs. This information is not intended to replace advice given to you by your health care provider. Make sure you discuss any questions you have with your health care provider. Document Revised: 04/07/2017 Document Reviewed: 04/18/2016 Elsevier Patient Education  2020 Elsevier Inc.  

## 2019-11-19 NOTE — Progress Notes (Addendum)
Patient ID: [name], female    DOB: June 25, 1940  Age: 79 y.o. MRN: [id number]    Subjective:  Subjective  HPI [name] presents for f/u htn and hemorrhoids .   Pt still feeling sob and fatigued.  No new symptoms.  She is needing to see Gi for hemorrhoids but tucks is helping --- she denies bleeding -- they are not a problem per pt  Review of Systems  Constitutional: Negative for appetite change, diaphoresis, fatigue and unexpected weight change.  Eyes: Negative for pain, redness and visual disturbance.  Respiratory: Negative for cough, chest tightness, shortness of breath and wheezing.   Cardiovascular: Negative for chest pain, palpitations and leg swelling.  Endocrine: Negative for cold intolerance, heat intolerance, polydipsia, polyphagia and polyuria.  Genitourinary: Negative for difficulty urinating, dysuria and frequency.  Neurological: Negative for dizziness, light-headedness, numbness and headaches.    History Past Medical History:  Diagnosis Date  . Abdominal wall hernia   . Anemia    "when lupus flares up"  . Anxiety   . Arthritis    "in my joints"  . Atrial fibrillation ([place])   . CAP (community acquired pneumonia) 08/20/2014  . Crohn's disease ([place])   . DVT of leg (deep venous thrombosis) ([place]) 2012-8./13/2013   "have had one in each leg now"  . DVT of lower extremity, bilateral ([place])    "have had them in both legs; last one was on the RLE this year" (04/25/2012)  . GERD (gastroesophageal reflux disease)   . Gout 2016  . History of hiatal hernia   . History of kidney stones   . Hypertension   . Hypothyroidism   . Lower GI bleed 2012  . Numbness and tingling of foot    bilaterally  . OSA on CPAP   . Refusal of blood transfusion for reasons of conscience 12/20/2011   pt is Jehovah's Witness  . Renal cell carcinoma   . Renal disorder    S/P nephrectomy; dialysis; "working fine now" (12/20/11)  . SBO (small bowel obstruction) ([place]) 11/2016  . SLE  (systemic lupus erythematosus) ([place])     She has a past surgical history that includes Cesarean section (1984); Nephrectomy transplanted organ (2006); Colectomy (1998); Excisional hemorrhoidectomy; Cataract extraction w/ intraocular lens  implant, bilateral (2005-2010); Vena cava filter placement (2012); Insertion of dialysis catheter ([id number]); Cholecystectomy (1995); Colostomy (1998); Colostomy reversal (1999); Bone marrow biopsy (1993; 1995); Abdominal exploration surgery (1995); Parathyroid implant removal (Left); Colonoscopy (N/A, 08/30/2013); Breast excisional biopsy; and Colonoscopy with propofol (N/A, 09/06/2017).   Her family history includes Heart disease in her mother; Hypertension in an other family member; Sleep apnea in her sister.She reports that she has never smoked. She has never used smokeless tobacco. She reports that she does not drink alcohol and does not use drugs.  Current Outpatient Medications on File Prior to Visit  Medication Sig Dispense Refill  . acetaminophen (TYLENOL) 325 MG tablet Take 325-650 mg by mouth every 6 (six) hours as needed for mild pain.     Marland Kitchen allopurinol (ZYLOPRIM) 300 MG tablet Take 1 tablet (300 mg total) by mouth daily. 90 tablet 3  . Ascorbic Acid (VITAMIN C PO) Take 500 mg by mouth daily.    . calcitRIOL (ROCALTROL) 0.25 MCG capsule Take 0.5 mcg by mouth daily. Per Pt states two daily.    Marland Kitchen epoetin alfa (EPOGEN,PROCRIT) 73419 UNIT/ML injection Inject 40,000 Units into the skin every 21 ( twenty-one) days.     . felodipine (  PLENDIL) 10 MG 24 hr tablet Take 10 mg by mouth every evening.     . folic acid (FOLVITE) 1 MG tablet Take 2 tablets (2 mg total) by mouth daily. 60 tablet 6  . furosemide (LASIX) 80 MG tablet Take 0.5 tablets (40 mg total) by mouth daily. 30 tablet 2  . gabapentin (NEURONTIN) 100 MG capsule Take 2 capsules (200 mg total) by mouth at bedtime.    Marland Kitchen levothyroxine (SYNTHROID, LEVOTHROID) 100 MCG tablet Take 1 tablet (100 mcg total) by  mouth daily.    . Melatonin 10 MG CAPS Take 1 capsule by mouth at bedtime.    . metoprolol tartrate (LOPRESSOR) 50 MG tablet Take 50 mg by mouth 2 (two) times daily.    . mycophenolate (MYFORTIC) 180 MG EC tablet Take 360 mg by mouth 2 (two) times daily.      Marland Kitchen omeprazole (PRILOSEC) 20 MG capsule Take 20 mg by mouth daily.    . polyethylene glycol (MIRALAX / GLYCOLAX) packet Take 17 g by mouth daily as needed for moderate constipation.     . Polyethylene Glycol 400 (BLINK TEARS) 0.25 % GEL Place 1 drop into both eyes 2 (two) times daily as needed (for dry eyes).    . predniSONE (DELTASONE) 5 MG tablet Take 5 mg by mouth daily.     . Probiotic Product (PROBIOTIC PO) Take 1 tablet by mouth daily.     Marland Kitchen senna (SENOKOT) 8.6 MG TABS tablet Take 2 tablets (17.2 mg total) by mouth at bedtime. 120 tablet 0  . tacrolimus (PROGRAF) 1 MG capsule Take 3 mg by mouth 2 (two) times daily.     . vitamin C (ASCORBIC ACID) 250 MG tablet Take 2 tablets (500 mg total) by mouth daily.     No current facility-administered medications on file prior to visit.     Objective:  Objective  Physical Exam Vitals and nursing note reviewed.  Constitutional:      Appearance: She is well-developed.  HENT:     Head: Normocephalic and atraumatic.  Eyes:     Conjunctiva/sclera: Conjunctivae normal.  Neck:     Thyroid: No thyromegaly.     Vascular: No carotid bruit or JVD.  Cardiovascular:     Rate and Rhythm: Normal rate and regular rhythm.     Heart sounds: Normal heart sounds. No murmur heard.   Pulmonary:     Effort: Pulmonary effort is normal. No respiratory distress.     Breath sounds: Normal breath sounds. No wheezing or rales.  Chest:     Chest wall: No tenderness.  Abdominal:     General: There is no distension.     Tenderness: There is no abdominal tenderness. There is no guarding or rebound.  Genitourinary:    Comments: Pt did not want rectal exam-- she said hemorrhoids are fine and she was waiting  for GI app Musculoskeletal:     Cervical back: Normal range of motion and neck supple.  Neurological:     Mental Status: She is alert and oriented to person, place, and time.    BP (!) 142/62   Pulse 72   Temp (!) 97 F (36.1 C)   Ht 5' 3"  (1.6 m)   Wt 137 lb 9.6 oz (62.4 kg)   LMP 03/05/2014   SpO2 100%   BMI 24.37 kg/m  Wt Readings from Last 3 Encounters:  11/19/19 137 lb 9.6 oz (62.4 kg)  11/19/19 137 lb 6.4 oz (62.3 kg)  10/23/19 135  lb 0.6 oz (61.3 kg)     Lab Results  Component Value Date   WBC 8.2 10/23/2019   HGB 12.6 11/07/2019   HCT 39.8 10/23/2019   PLT 136 (L) 10/23/2019   GLUCOSE 106 (H) 11/07/2019   CHOL 130 11/19/2019   TRIG 172.0 (H) 11/19/2019   HDL 44.10 11/19/2019   LDLCALC 52 11/19/2019   ALT 14 10/23/2019   AST 17 10/23/2019   NA 138 11/07/2019   K 4.1 11/07/2019   CL 107 11/07/2019   CREATININE 2.09 (H) 11/07/2019   BUN 56 (H) 11/07/2019   CO2 21 (L) 11/07/2019   TSH 0.64 11/19/2019   INR 1.6 (H) 11/07/2019   HGBA1C 6.5 (H) 11/12/2014    No results found.   Assessment & Plan:  Plan  I am having Keelia D. Bluett maintain her calcitRIOL, polyethylene glycol, mycophenolate, predniSONE, epoetin alfa, tacrolimus, acetaminophen, gabapentin, levothyroxine, omeprazole, metoprolol tartrate, Probiotic Product (PROBIOTIC PO), Polyethylene Glycol 400, felodipine, furosemide, senna, vitamin C, allopurinol, Melatonin, Ascorbic Acid (VITAMIN C PO), folic acid, and rivaroxaban.  Meds ordered this encounter  Medications  . rivaroxaban (XARELTO) 20 MG TABS tablet    Sig: Take 1 tablet (20 mg total) by mouth daily with supper.    Dispense:  90 tablet    Refill:  5    Problem List Items Addressed This Visit      Unprioritized   Anemia of chronic disease    With recent drop hgb--- today 7 Lab Results  Component Value Date   HGB 12.6 11/07/2019   Called pt about labs--- was not able to reach her----- I was able to call her emergency contact --  her son Dellis Filbert---  He will take her to the ER for futher eval       CKD (chronic kidney disease) stage 3, GFR 30-59 ml/min (HCC)    S/p renal transplant        External hemorrhoid, bleeding    Refer to GI       Relevant Medications   rivaroxaban (XARELTO) 20 MG TABS tablet   Hemorrhoids   Relevant Medications   rivaroxaban (XARELTO) 20 MG TABS tablet   Other Relevant Orders   Ambulatory referral to Gastroenterology   HTN (hypertension) - Primary (Chronic)   Relevant Medications   rivaroxaban (XARELTO) 20 MG TABS tablet   Other Relevant Orders   Lipid panel (Completed)   Hypothyroidism   Relevant Orders   TSH (Completed)    Other Visit Diagnoses    Acute deep vein thrombosis (DVT) of proximal vein of both lower extremities (HCC)       Relevant Medications   rivaroxaban (XARELTO) 20 MG TABS tablet   History of GI bleed       Relevant Orders   Ambulatory referral to Gastroenterology      Follow-up: Return in about 6 months (around 05/21/2020), or if symptoms worsen or fail to improve, for hypertension.  Ann Held, DO

## 2019-11-19 NOTE — Assessment & Plan Note (Signed)
S/p renal transplant

## 2019-11-19 NOTE — Assessment & Plan Note (Signed)
Refer to GI 

## 2019-11-19 NOTE — Patient Instructions (Signed)
Please schedule your next medicare wellness visit with me in 1 yr.  Continue to eat heart healthy diet (full of fruits, vegetables, whole grains, lean protein, water--limit salt, fat, and sugar intake) and increase physical activity as tolerated.  Continue doing brain stimulating activities (puzzles, reading, adult coloring books, staying active) to keep memory sharp.   Bring a copy of your living will and/or healthcare power of attorney to your next office visit.   [name] , Thank you for taking time to come for your Medicare Wellness Visit. I appreciate your ongoing commitment to your health goals. Please review the following plan we discussed and let me know if I can assist you in the future.   These are the goals we discussed: Goals    . Increase physical activity     Workout at home 3x/week for 34mnutes        This is a list of the screening recommended for you and due dates:  Health Maintenance  Topic Date Due  . Tetanus Vaccine  Never done  . Flu Shot  12/08/2019  . DEXA scan (bone density measurement)  Completed  . COVID-19 Vaccine  Completed  .  Hepatitis C: One time screening is recommended by Center for Disease Control  (CDC) for  adults born from [birth year]through 1963.   Completed  . Pneumonia vaccines  Completed    Preventive Care 659Years and Older, Female Preventive care refers to lifestyle choices and visits with your health care provider that can promote health and wellness. This includes:  A yearly physical exam. This is also called an annual well check.  Regular dental and eye exams.  Immunizations.  Screening for certain conditions.  Healthy lifestyle choices, such as diet and exercise. What can I expect for my preventive care visit? Physical exam Your health care provider will check:  Height and weight. These may be used to calculate body mass index (BMI), which is a measurement that tells if you are at a healthy weight.  Heart rate and blood  pressure.  Your skin for abnormal spots. Counseling Your health care provider may ask you questions about:  Alcohol, tobacco, and drug use.  Emotional well-being.  Home and relationship well-being.  Sexual activity.  Eating habits.  History of falls.  Memory and ability to understand (cognition).  Work and work eStatistician  Pregnancy and menstrual history. What immunizations do I need?  Influenza (flu) vaccine  This is recommended every year. Tetanus, diphtheria, and pertussis (Tdap) vaccine  You may need a Td booster every 10 years. Varicella (chickenpox) vaccine  You may need this vaccine if you have not already been vaccinated. Zoster (shingles) vaccine  You may need this after age 579 Pneumococcal conjugate (PCV13) vaccine  One dose is recommended after age 77 Pneumococcal polysaccharide (PPSV23) vaccine  One dose is recommended after age 77 Measles, mumps, and rubella (MMR) vaccine  You may need at least one dose of MMR if you were born in 1957 or later. You may also need a second dose. Meningococcal conjugate (MenACWY) vaccine  You may need this if you have certain conditions. Hepatitis A vaccine  You may need this if you have certain conditions or if you travel or work in places where you may be exposed to hepatitis A. Hepatitis B vaccine  You may need this if you have certain conditions or if you travel or work in places where you may be exposed to hepatitis B. Haemophilus influenzae type b (Hib) vaccine  You may need this if you have certain conditions. You may receive vaccines as individual doses or as more than one vaccine together in one shot (combination vaccines). Talk with your health care provider about the risks and benefits of combination vaccines. What tests do I need? Blood tests  Lipid and cholesterol levels. These may be checked every 5 years, or more frequently depending on your overall health.  Hepatitis C test.  Hepatitis B  test. Screening  Lung cancer screening. You may have this screening every year starting at age 55 if you have a 30-pack-year history of smoking and currently smoke or have quit within the past 15 years.  Colorectal cancer screening. All adults should have this screening starting at age 50 and continuing until age 75. Your health care provider may recommend screening at age 45 if you are at increased risk. You will have tests every 1-10 years, depending on your results and the type of screening test.  Diabetes screening. This is done by checking your blood sugar (glucose) after you have not eaten for a while (fasting). You may have this done every 1-3 years.  Mammogram. This may be done every 1-2 years. Talk with your health care provider about how often you should have regular mammograms.  BRCA-related cancer screening. This may be done if you have a family history of breast, ovarian, tubal, or peritoneal cancers. Other tests  Sexually transmitted disease (STD) testing.  Bone density scan. This is done to screen for osteoporosis. You may have this done starting at age 65. Follow these instructions at home: Eating and drinking  Eat a diet that includes fresh fruits and vegetables, whole grains, lean protein, and low-fat dairy products. Limit your intake of foods with high amounts of sugar, saturated fats, and salt.  Take vitamin and mineral supplements as recommended by your health care provider.  Do not drink alcohol if your health care provider tells you not to drink.  If you drink alcohol: ? Limit how much you have to 0-1 drink a day. ? Be aware of how much alcohol is in your drink. In the [place] one drink equals one 12 oz bottle of beer (355 mL), one 5 oz glass of wine (148 mL), or one 1 oz glass of hard liquor (44 mL). Lifestyle  Take daily care of your teeth and gums.  Stay active. Exercise for at least 30 minutes on 5 or more days each week.  Do not use any products that  contain nicotine or tobacco, such as cigarettes, e-cigarettes, and chewing tobacco. If you need help quitting, ask your health care provider.  If you are sexually active, practice safe sex. Use a condom or other form of protection in order to prevent STIs (sexually transmitted infections).  Talk with your health care provider about taking a low-dose aspirin or statin. What's next?  Go to your health care provider once a year for a well check visit.  Ask your health care provider how often you should have your eyes and teeth checked.  Stay up to date on all vaccines. This information is not intended to replace advice given to you by your health care provider. Make sure you discuss any questions you have with your health care provider. Document Revised: 04/19/2018 Document Reviewed: 04/19/2018 Elsevier Patient Education  2020 Elsevier Inc.  

## 2019-11-19 NOTE — Assessment & Plan Note (Signed)
Lab Results  Component Value Date   CHOL 130 11/19/2019   HDL 44.10 11/19/2019   LDLCALC 52 11/19/2019   TRIG 172.0 (H) 11/19/2019   CHOLHDL 3 11/19/2019  Encouraged heart healthy diet, increase exercise, avoid trans fats, consider a krill oil cap daily

## 2019-11-19 NOTE — Addendum Note (Signed)
Addended by: Roma Schanz R on: 11/19/2019 09:33 PM   Modules accepted: Orders

## 2019-11-19 NOTE — Assessment & Plan Note (Addendum)
Per nephrology Lab Results  Component Value Date   HGB 12.6 11/07/2019

## 2019-11-21 ENCOUNTER — Other Ambulatory Visit: Payer: Self-pay

## 2019-11-21 ENCOUNTER — Encounter (HOSPITAL_COMMUNITY)
Admission: RE | Admit: 2019-11-21 | Discharge: 2019-11-21 | Disposition: A | Discharge status: Home / Self Care | Payer: Medicare Other | Source: Ambulatory Visit | Attending: Nephrology | Admitting: Nephrology

## 2019-11-21 VITALS — BP 122/73 | HR 66 | Temp 97.1°F | Resp 20

## 2019-11-21 DIAGNOSIS — D631 Anemia in chronic kidney disease: Secondary | ICD-10-CM | POA: Diagnosis not present

## 2019-11-21 DIAGNOSIS — N183 Chronic kidney disease, stage 3 unspecified: Secondary | ICD-10-CM

## 2019-11-21 DIAGNOSIS — N184 Chronic kidney disease, stage 4 (severe): Secondary | ICD-10-CM | POA: Diagnosis not present

## 2019-11-21 MED ORDER — EPOETIN ALFA 20000 UNIT/ML IJ SOLN
INTRAMUSCULAR | Status: AC
  Filled 2019-11-21: qty 1

## 2019-11-21 MED ORDER — EPOETIN ALFA 10000 UNIT/ML IJ SOLN
INTRAMUSCULAR | Status: AC
  Filled 2019-11-21: qty 1

## 2019-11-21 MED ORDER — EPOETIN ALFA 10000 UNIT/ML IJ SOLN
30000.0000 [IU] | INTRAMUSCULAR | Status: DC
  Administered 2019-11-21: 30000 [IU] via SUBCUTANEOUS

## 2019-11-22 LAB — POCT HEMOGLOBIN-HEMACUE: Hemoglobin: 10.5 g/dL — ABNORMAL LOW (ref 12.0–15.0)

## 2019-11-22 MED FILL — Epoetin Alfa Inj 10000 Unit/ML: INTRAMUSCULAR | Qty: 1 | Status: AC

## 2019-11-22 MED FILL — Epoetin Alfa Inj 20000 Unit/ML: INTRAMUSCULAR | Qty: 1 | Status: AC

## 2019-12-05 ENCOUNTER — Other Ambulatory Visit: Payer: Self-pay

## 2019-12-05 ENCOUNTER — Encounter (HOSPITAL_COMMUNITY)
Admission: RE | Admit: 2019-12-05 | Discharge: 2019-12-05 | Disposition: A | Discharge status: Home / Self Care | Payer: Medicare Other | Source: Ambulatory Visit | Attending: Nephrology | Admitting: Nephrology

## 2019-12-05 VITALS — BP 142/64 | HR 63 | Temp 97.2°F | Resp 20

## 2019-12-05 DIAGNOSIS — N183 Chronic kidney disease, stage 3 unspecified: Secondary | ICD-10-CM

## 2019-12-05 DIAGNOSIS — N184 Chronic kidney disease, stage 4 (severe): Secondary | ICD-10-CM | POA: Diagnosis not present

## 2019-12-05 DIAGNOSIS — D631 Anemia in chronic kidney disease: Secondary | ICD-10-CM | POA: Diagnosis not present

## 2019-12-05 LAB — POCT HEMOGLOBIN-HEMACUE: Hemoglobin: 9.7 g/dL — ABNORMAL LOW (ref 12.0–15.0)

## 2019-12-05 MED ORDER — EPOETIN ALFA 20000 UNIT/ML IJ SOLN
INTRAMUSCULAR | Status: AC
  Administered 2019-12-05: 20000 [IU]
  Filled 2019-12-05: qty 1

## 2019-12-05 MED ORDER — EPOETIN ALFA 10000 UNIT/ML IJ SOLN: 30000.0000 [IU] | INTRAMUSCULAR | Status: DC

## 2019-12-05 MED ORDER — EPOETIN ALFA 10000 UNIT/ML IJ SOLN
INTRAMUSCULAR | Status: AC
  Administered 2019-12-05: 10000 [IU] via SUBCUTANEOUS
  Filled 2019-12-05: qty 1

## 2019-12-05 NOTE — Progress Notes (Signed)
Pt hgb 9.7 via hemocue pt states "problems with hemmorrhoids" denies SOB, denies chest pain.  md called, no new orders. Will continue to monitor

## 2019-12-11 DIAGNOSIS — N2581 Secondary hyperparathyroidism of renal origin: Secondary | ICD-10-CM | POA: Diagnosis not present

## 2019-12-11 DIAGNOSIS — K922 Gastrointestinal hemorrhage, unspecified: Secondary | ICD-10-CM | POA: Diagnosis not present

## 2019-12-11 DIAGNOSIS — M329 Systemic lupus erythematosus, unspecified: Secondary | ICD-10-CM | POA: Diagnosis not present

## 2019-12-11 DIAGNOSIS — D631 Anemia in chronic kidney disease: Secondary | ICD-10-CM | POA: Diagnosis not present

## 2019-12-11 DIAGNOSIS — Z86718 Personal history of other venous thrombosis and embolism: Secondary | ICD-10-CM | POA: Diagnosis not present

## 2019-12-11 DIAGNOSIS — I129 Hypertensive chronic kidney disease with stage 1 through stage 4 chronic kidney disease, or unspecified chronic kidney disease: Secondary | ICD-10-CM | POA: Diagnosis not present

## 2019-12-11 DIAGNOSIS — Z940 Kidney transplant status: Secondary | ICD-10-CM | POA: Diagnosis not present

## 2019-12-11 DIAGNOSIS — M109 Gout, unspecified: Secondary | ICD-10-CM | POA: Diagnosis not present

## 2019-12-11 DIAGNOSIS — I4891 Unspecified atrial fibrillation: Secondary | ICD-10-CM | POA: Diagnosis not present

## 2019-12-11 DIAGNOSIS — N184 Chronic kidney disease, stage 4 (severe): Secondary | ICD-10-CM | POA: Diagnosis not present

## 2019-12-11 DIAGNOSIS — E039 Hypothyroidism, unspecified: Secondary | ICD-10-CM | POA: Diagnosis not present

## 2019-12-11 DIAGNOSIS — N2589 Other disorders resulting from impaired renal tubular function: Secondary | ICD-10-CM | POA: Diagnosis not present

## 2019-12-19 ENCOUNTER — Other Ambulatory Visit: Payer: Self-pay

## 2019-12-19 ENCOUNTER — Encounter (HOSPITAL_COMMUNITY)
Admission: RE | Admit: 2019-12-19 | Discharge: 2019-12-19 | Disposition: A | Discharge status: Home / Self Care | Payer: Medicare Other | Source: Ambulatory Visit | Attending: Nephrology | Admitting: Nephrology

## 2019-12-19 VITALS — BP 138/67 | HR 71 | Temp 97.0°F | Resp 20

## 2019-12-19 DIAGNOSIS — D631 Anemia in chronic kidney disease: Secondary | ICD-10-CM | POA: Insufficient documentation

## 2019-12-19 DIAGNOSIS — N184 Chronic kidney disease, stage 4 (severe): Secondary | ICD-10-CM | POA: Diagnosis not present

## 2019-12-19 DIAGNOSIS — N183 Chronic kidney disease, stage 3 unspecified: Secondary | ICD-10-CM

## 2019-12-19 LAB — RENAL FUNCTION PANEL
Albumin: 3.8 g/dL (ref 3.5–5.0)
Anion gap: 12 (ref 5–15)
BUN: 44 mg/dL — ABNORMAL HIGH (ref 8–23)
CO2: 20 mmol/L — ABNORMAL LOW (ref 22–32)
Calcium: 9.3 mg/dL (ref 8.9–10.3)
Chloride: 105 mmol/L (ref 98–111)
Creatinine, Ser: 1.81 mg/dL — ABNORMAL HIGH (ref 0.44–1.00)
GFR calc Af Amer: 31 mL/min — ABNORMAL LOW (ref >60)
GFR calc non Af Amer: 27 mL/min — ABNORMAL LOW (ref >60)
Glucose, Bld: 96 mg/dL (ref 70–99)
Phosphorus: 4.1 mg/dL (ref 2.5–4.6)
Potassium: 4.6 mmol/L (ref 3.5–5.1)
Sodium: 137 mmol/L (ref 135–145)

## 2019-12-19 LAB — POCT HEMOGLOBIN-HEMACUE: Hemoglobin: 10.4 g/dL — ABNORMAL LOW (ref 12.0–15.0)

## 2019-12-19 LAB — PROTIME-INR
INR: 1.6 — ABNORMAL HIGH (ref 0.8–1.2)
Prothrombin Time: 18.8 seconds — ABNORMAL HIGH (ref 11.4–15.2)

## 2019-12-19 LAB — IRON AND TIBC
Iron: 46 ug/dL (ref 28–170)
Saturation Ratios: 22 % (ref 10.4–31.8)
TIBC: 211 ug/dL — ABNORMAL LOW (ref 250–450)
UIBC: 165 ug/dL

## 2019-12-19 LAB — FERRITIN: Ferritin: 960 ng/mL — ABNORMAL HIGH (ref 11–307)

## 2019-12-19 MED ORDER — EPOETIN ALFA 10000 UNIT/ML IJ SOLN
INTRAMUSCULAR | Status: AC
  Administered 2019-12-19: 30000 [IU] via SUBCUTANEOUS
  Filled 2019-12-19: qty 1

## 2019-12-19 MED ORDER — EPOETIN ALFA 10000 UNIT/ML IJ SOLN: 30000.0000 [IU] | INTRAMUSCULAR | Status: DC

## 2019-12-19 MED ORDER — EPOETIN ALFA 20000 UNIT/ML IJ SOLN
INTRAMUSCULAR | Status: AC
  Filled 2019-12-19: qty 1

## 2019-12-20 MED FILL — Epoetin Alfa Inj 10000 Unit/ML: INTRAMUSCULAR | Qty: 1 | Status: AC

## 2019-12-20 MED FILL — Epoetin Alfa Inj 20000 Unit/ML: INTRAMUSCULAR | Qty: 1 | Status: AC

## 2019-12-23 ENCOUNTER — Other Ambulatory Visit: Payer: Self-pay

## 2019-12-23 ENCOUNTER — Ambulatory Visit (HOSPITAL_COMMUNITY): Payer: Medicare Other | Attending: Internal Medicine

## 2019-12-23 DIAGNOSIS — I351 Nonrheumatic aortic (valve) insufficiency: Secondary | ICD-10-CM | POA: Diagnosis not present

## 2019-12-23 LAB — ECHOCARDIOGRAM COMPLETE
AR max vel: 2.2 cm2
AV Area VTI: 1.59 cm2
AV Area mean vel: 1.61 cm2
AV Mean grad: 7 mmHg
AV Peak grad: 14.4 mmHg
Ao pk vel: 1.9 m/s
Area-P 1/2: 3.83 cm2
P 1/2 time: 346 msec
S' Lateral: 2.8 cm

## 2020-01-02 ENCOUNTER — Encounter (HOSPITAL_COMMUNITY): Payer: Medicare Other

## 2020-01-04 ENCOUNTER — Ambulatory Visit: Payer: Medicare Other | Attending: Internal Medicine

## 2020-01-04 DIAGNOSIS — Z23 Encounter for immunization: Secondary | ICD-10-CM

## 2020-01-04 NOTE — Progress Notes (Signed)
   Covid-19 Vaccination Clinic  Name:  [name]    MRN: [id number] DOB: 01-Apr-1941  01/04/2020  Ms. Cassell was observed post Covid-19 immunization for 30 minutes based on pre-vaccination screening without incident. She was provided with Vaccine Information Sheet and instruction to access the V-Safe system.   Ms. Tamargo was instructed to call 911 with any severe reactions post vaccine: Marland Kitchen Difficulty breathing  . Swelling of face and throat  . A fast heartbeat  . A bad rash all over body  . Dizziness and weakness

## 2020-01-06 DIAGNOSIS — L03032 Cellulitis of left toe: Secondary | ICD-10-CM | POA: Diagnosis not present

## 2020-01-09 ENCOUNTER — Encounter (HOSPITAL_COMMUNITY)
Admission: RE | Admit: 2020-01-09 | Discharge: 2020-01-09 | Disposition: A | Discharge status: Home / Self Care | Payer: Medicare Other | Source: Ambulatory Visit | Attending: Nephrology | Admitting: Nephrology

## 2020-01-09 ENCOUNTER — Other Ambulatory Visit: Payer: Self-pay

## 2020-01-09 VITALS — BP 135/64 | HR 70 | Temp 97.0°F | Resp 20

## 2020-01-09 DIAGNOSIS — N184 Chronic kidney disease, stage 4 (severe): Secondary | ICD-10-CM | POA: Insufficient documentation

## 2020-01-09 DIAGNOSIS — N183 Chronic kidney disease, stage 3 unspecified: Secondary | ICD-10-CM | POA: Diagnosis not present

## 2020-01-09 DIAGNOSIS — D631 Anemia in chronic kidney disease: Secondary | ICD-10-CM | POA: Insufficient documentation

## 2020-01-09 LAB — POCT HEMOGLOBIN-HEMACUE: Hemoglobin: 10.7 g/dL — ABNORMAL LOW (ref 12.0–15.0)

## 2020-01-09 MED ORDER — EPOETIN ALFA 10000 UNIT/ML IJ SOLN: 30000.0000 [IU] | INTRAMUSCULAR | Status: DC

## 2020-01-09 MED ORDER — EPOETIN ALFA 10000 UNIT/ML IJ SOLN
INTRAMUSCULAR | Status: AC
  Administered 2020-01-09: 10000 [IU] via SUBCUTANEOUS
  Filled 2020-01-09: qty 1

## 2020-01-09 MED ORDER — EPOETIN ALFA 20000 UNIT/ML IJ SOLN
INTRAMUSCULAR | Status: AC
  Administered 2020-01-09: 20000 [IU]
  Filled 2020-01-09: qty 1

## 2020-01-16 ENCOUNTER — Encounter (HOSPITAL_COMMUNITY): Payer: Medicare Other

## 2020-01-17 ENCOUNTER — Encounter: Payer: Self-pay | Admitting: Family Medicine

## 2020-01-23 ENCOUNTER — Other Ambulatory Visit: Payer: Self-pay

## 2020-01-23 ENCOUNTER — Encounter (HOSPITAL_COMMUNITY)
Admission: RE | Admit: 2020-01-23 | Discharge: 2020-01-23 | Disposition: A | Discharge status: Home / Self Care | Payer: Medicare Other | Source: Ambulatory Visit | Attending: Nephrology | Admitting: Nephrology

## 2020-01-23 VITALS — BP 150/66 | HR 72 | Resp 16

## 2020-01-23 DIAGNOSIS — N184 Chronic kidney disease, stage 4 (severe): Secondary | ICD-10-CM | POA: Diagnosis not present

## 2020-01-23 DIAGNOSIS — N183 Chronic kidney disease, stage 3 unspecified: Secondary | ICD-10-CM

## 2020-01-23 DIAGNOSIS — D631 Anemia in chronic kidney disease: Secondary | ICD-10-CM | POA: Diagnosis not present

## 2020-01-23 LAB — POCT HEMOGLOBIN-HEMACUE: Hemoglobin: 11.6 g/dL — ABNORMAL LOW (ref 12.0–15.0)

## 2020-01-23 MED ORDER — EPOETIN ALFA 10000 UNIT/ML IJ SOLN
INTRAMUSCULAR | Status: AC
  Filled 2020-01-23: qty 1

## 2020-01-23 MED ORDER — EPOETIN ALFA 20000 UNIT/ML IJ SOLN
INTRAMUSCULAR | Status: AC
  Filled 2020-01-23: qty 1

## 2020-01-23 MED ORDER — EPOETIN ALFA 10000 UNIT/ML IJ SOLN
30000.0000 [IU] | INTRAMUSCULAR | Status: DC
  Administered 2020-01-23: 30000 [IU] via SUBCUTANEOUS

## 2020-01-24 ENCOUNTER — Ambulatory Visit: Payer: Medicare Other | Admitting: Hematology & Oncology

## 2020-01-24 ENCOUNTER — Other Ambulatory Visit: Payer: Medicare Other

## 2020-02-04 DIAGNOSIS — S92535D Nondisplaced fracture of distal phalanx of left lesser toe(s), subsequent encounter for fracture with routine healing: Secondary | ICD-10-CM | POA: Diagnosis not present

## 2020-02-04 DIAGNOSIS — S99922A Unspecified injury of left foot, initial encounter: Secondary | ICD-10-CM | POA: Diagnosis not present

## 2020-02-06 ENCOUNTER — Encounter (HOSPITAL_COMMUNITY)
Admission: RE | Admit: 2020-02-06 | Discharge: 2020-02-06 | Disposition: A | Discharge status: Home / Self Care | Payer: Medicare Other | Source: Ambulatory Visit | Attending: Nephrology | Admitting: Nephrology

## 2020-02-06 ENCOUNTER — Other Ambulatory Visit: Payer: Self-pay

## 2020-02-06 VITALS — BP 134/67 | HR 75 | Temp 96.9°F | Resp 20

## 2020-02-06 DIAGNOSIS — N183 Chronic kidney disease, stage 3 unspecified: Secondary | ICD-10-CM | POA: Diagnosis not present

## 2020-02-06 DIAGNOSIS — D631 Anemia in chronic kidney disease: Secondary | ICD-10-CM | POA: Insufficient documentation

## 2020-02-06 LAB — RENAL FUNCTION PANEL
Albumin: 4 g/dL (ref 3.5–5.0)
Anion gap: 10 (ref 5–15)
BUN: 48 mg/dL — ABNORMAL HIGH (ref 8–23)
CO2: 21 mmol/L — ABNORMAL LOW (ref 22–32)
Calcium: 9.5 mg/dL (ref 8.9–10.3)
Chloride: 106 mmol/L (ref 98–111)
Creatinine, Ser: 2.05 mg/dL — ABNORMAL HIGH (ref 0.44–1.00)
GFR calc Af Amer: 27 mL/min — ABNORMAL LOW
GFR calc non Af Amer: 23 mL/min — ABNORMAL LOW
Glucose, Bld: 100 mg/dL — ABNORMAL HIGH (ref 70–99)
Phosphorus: 3.9 mg/dL (ref 2.5–4.6)
Potassium: 3.9 mmol/L (ref 3.5–5.1)
Sodium: 137 mmol/L (ref 135–145)

## 2020-02-06 LAB — IRON AND TIBC
Iron: 79 ug/dL (ref 28–170)
Saturation Ratios: 39 % — ABNORMAL HIGH (ref 10.4–31.8)
TIBC: 203 ug/dL — ABNORMAL LOW (ref 250–450)
UIBC: 124 ug/dL

## 2020-02-06 LAB — FERRITIN: Ferritin: 1038 ng/mL — ABNORMAL HIGH (ref 11–307)

## 2020-02-06 LAB — PROTIME-INR
INR: 1.5 — ABNORMAL HIGH (ref 0.8–1.2)
Prothrombin Time: 17.9 seconds — ABNORMAL HIGH (ref 11.4–15.2)

## 2020-02-06 LAB — POCT HEMOGLOBIN-HEMACUE: Hemoglobin: 12.6 g/dL (ref 12.0–15.0)

## 2020-02-06 MED ORDER — EPOETIN ALFA 10000 UNIT/ML IJ SOLN: 30000.0000 [IU] | INTRAMUSCULAR | Status: DC

## 2020-02-11 NOTE — Progress Notes (Signed)
Patient ID: [name], female   DOB: 25-Aug-1940, 79 y.o.   MRN: [id number]   78 y.o.  with HTN and PAF  Initially seen 09/2014 Previously seen by [facility] and most recently by [facility] Dr Matthew Saras.  Longstanding PAF and history of DVT On chronic coumadin. CRF with previous dialysis and transplant 2006 Baseline Cr 2.1 Was on low dose amiodarone but caused decreased DLCO and thyroid issue She has been maint NSR with no dyspnea chest pain or palpitations   She uses Dr Dederding as primary.   April 2018:  Hospitalized with SBO From adhesions , hernia and chrones UTI on ceftin now   Complains of leg pain bilateral worse when walking worse in left Knee sounds arthritic and has had injections   TTE 12/27/18 moderate AR LV normal size moderate LVH asymmetric EF >65% normal root   No cardiac complaints had some GI bleeding and Hemorids coumadin d/c and restarted on xarelto 11/19/19 for recurrent DVT, Positive LA and elevated anticardiolipin antibody   She is sad that Dr Oletta Lamas her GI doctor and Dr Dedierding are retiring  She recently moved to downsize to a [employer] a box and broke her left toe   ROS: Denies fever, malais, weight loss, blurry vision, decreased visual acuity, cough, sputum, SOB, hemoptysis, pleuritic pain, palpitaitons, heartburn, abdominal pain, melena, lower extremity edema, claudication, or rash.  All other systems reviewed and negative  General: Affect appropriate Mildly cushingoid  HEENT: normal Neck supple with no adenopathy JVP normal no bruits no thyromegaly Lungs clear with no wheezing and good diaphragmatic motion Heart:  S1/S2 AR  murmur, no rub, gallop or click old dialysis graft LUE PMI normal Abdomen: benighn, BS positve, no tenderness, no AAA old dialysis graft left groin With lateral hernia on right  no bruit.  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      allopurinol (ZYLOPRIM) 300 MG tablet Take 1 tablet (300 mg total) by mouth daily. 90 tablet 3   ascorbic acid (VITAMIN C) 500 MG tablet Take 500 mg by mouth daily.     calcitRIOL (ROCALTROL) 0.25 MCG capsule Take 0.5 mcg by mouth. One a day alternating with two a day     epoetin alfa (EPOGEN,PROCRIT) 25638 UNIT/ML injection Inject 40,000 Units into the skin every 21 ( twenty-one) days.      felodipine (PLENDIL) 10 MG 24 hr tablet Take 10 mg by mouth every evening.      folic acid (FOLVITE) 1 MG tablet Take 2 tablets (2 mg total) by mouth daily. 60 tablet 6   furosemide (LASIX) 80 MG tablet Take 0.5 tablets (40 mg total) by mouth daily. 30 tablet 2   gabapentin (NEURONTIN) 100 MG capsule Take 2 capsules (200 mg total) by mouth at bedtime.     levothyroxine (SYNTHROID, LEVOTHROID) 100 MCG tablet Take 1 tablet (100 mcg total) by mouth daily.     Melatonin 10 MG CAPS Take 1 capsule by mouth at bedtime.     metoprolol tartrate (LOPRESSOR) 50 MG tablet Take 50 mg by mouth 2 (two) times daily.     mycophenolate (MYFORTIC) 180 MG EC tablet Take 360 mg by mouth 2 (two) times daily.  omeprazole (PRILOSEC) 20 MG capsule Take 20 mg by mouth daily.     ondansetron (ZOFRAN ODT) 8 MG disintegrating tablet Take 1 tablet (8 mg total) by mouth every 8 (eight) hours as needed for nausea or vomiting. 12 tablet 0   polyethylene glycol (MIRALAX / GLYCOLAX) packet Take 17 g by mouth daily as needed for moderate constipation.      Polyethylene Glycol 400 (BLINK TEARS) 0.25 % GEL Place 1 drop into both eyes 2 (two) times daily as needed (for dry eyes).     predniSONE (DELTASONE) 5 MG tablet Take 5 mg by mouth daily.      Probiotic Product (PROBIOTIC PO) Take 1 tablet by mouth daily.      rivaroxaban (XARELTO) 20 MG TABS tablet Take 1  tablet (20 mg total) by mouth daily with supper. 90 tablet 5   tacrolimus (PROGRAF) 1 MG capsule Take 3 mg by mouth 2 (two) times daily.      No current facility-administered medications for this visit.    Allergies  Amoxicillin, Ampicillin, Ciprofloxacin, Erythromycin, Penicillins, Tetracycline, Labetalol, Sulfa antibiotics, and Sulfamethoxazole-trimethoprim    Electrocardiogram:  04/12/19 SR rate 65 RBBB no acute changes   Assessment and Plan  AR:  Mild by most recent echo done 12/08/19 observe  CRF:  F/U Dr Dederding  Baseline Cr mid 2's on prograf and deltasone Gout:  On allopurinol  Had pulse dose prednisone for Rx PAF: maint NSR  Amiodarone stopped due to low DLCO on xarelto see below  DVT:  History of 12/2011 extending from common femoral to calf veins on coumadin 10/21/19 duplex suggested more acute DVT on left and chronic on right  RBBB:  Chronic no change no high grade AV block yearly ECG  SBO:  Resolved on conservative Rx History of [place]  Leg Pain: likely arthritis but pulses are hard to palpate f/u LE arterial duplex and ABI's   Jenkins Rouge

## 2020-02-20 ENCOUNTER — Other Ambulatory Visit: Payer: Self-pay

## 2020-02-20 ENCOUNTER — Encounter (HOSPITAL_COMMUNITY)
Admission: RE | Admit: 2020-02-20 | Discharge: 2020-02-20 | Disposition: A | Discharge status: Home / Self Care | Payer: Medicare Other | Source: Ambulatory Visit | Attending: Nephrology | Admitting: Nephrology

## 2020-02-20 VITALS — BP 125/75 | HR 73 | Temp 96.9°F | Resp 20

## 2020-02-20 DIAGNOSIS — D631 Anemia in chronic kidney disease: Secondary | ICD-10-CM | POA: Insufficient documentation

## 2020-02-20 DIAGNOSIS — N183 Chronic kidney disease, stage 3 unspecified: Secondary | ICD-10-CM | POA: Diagnosis not present

## 2020-02-20 LAB — POCT HEMOGLOBIN-HEMACUE: Hemoglobin: 12 g/dL (ref 12.0–15.0)

## 2020-02-20 MED ORDER — EPOETIN ALFA 10000 UNIT/ML IJ SOLN: 30000.0000 [IU] | INTRAMUSCULAR | Status: DC

## 2020-02-21 ENCOUNTER — Encounter (HOSPITAL_BASED_OUTPATIENT_CLINIC_OR_DEPARTMENT_OTHER): Payer: Self-pay | Admitting: Emergency Medicine

## 2020-02-21 ENCOUNTER — Other Ambulatory Visit: Payer: Self-pay

## 2020-02-21 ENCOUNTER — Emergency Department (HOSPITAL_BASED_OUTPATIENT_CLINIC_OR_DEPARTMENT_OTHER)
Admission: EM | Admit: 2020-02-21 | Discharge: 2020-02-21 | Disposition: A | Discharge status: Home / Self Care | Payer: Medicare Other | Attending: Emergency Medicine | Admitting: Emergency Medicine

## 2020-02-21 ENCOUNTER — Emergency Department (HOSPITAL_BASED_OUTPATIENT_CLINIC_OR_DEPARTMENT_OTHER): Payer: Medicare Other

## 2020-02-21 DIAGNOSIS — E039 Hypothyroidism, unspecified: Secondary | ICD-10-CM | POA: Insufficient documentation

## 2020-02-21 DIAGNOSIS — R109 Unspecified abdominal pain: Secondary | ICD-10-CM

## 2020-02-21 DIAGNOSIS — Z85528 Personal history of other malignant neoplasm of kidney: Secondary | ICD-10-CM | POA: Insufficient documentation

## 2020-02-21 DIAGNOSIS — K567 Ileus, unspecified: Secondary | ICD-10-CM | POA: Diagnosis not present

## 2020-02-21 DIAGNOSIS — Z79890 Hormone replacement therapy (postmenopausal): Secondary | ICD-10-CM | POA: Insufficient documentation

## 2020-02-21 DIAGNOSIS — K869 Disease of pancreas, unspecified: Secondary | ICD-10-CM | POA: Diagnosis not present

## 2020-02-21 DIAGNOSIS — I10 Essential (primary) hypertension: Secondary | ICD-10-CM | POA: Diagnosis not present

## 2020-02-21 DIAGNOSIS — K439 Ventral hernia without obstruction or gangrene: Secondary | ICD-10-CM | POA: Diagnosis not present

## 2020-02-21 DIAGNOSIS — K56600 Partial intestinal obstruction, unspecified as to cause: Secondary | ICD-10-CM | POA: Diagnosis not present

## 2020-02-21 DIAGNOSIS — Z7901 Long term (current) use of anticoagulants: Secondary | ICD-10-CM | POA: Insufficient documentation

## 2020-02-21 DIAGNOSIS — Z79899 Other long term (current) drug therapy: Secondary | ICD-10-CM | POA: Insufficient documentation

## 2020-02-21 DIAGNOSIS — I700 Atherosclerosis of aorta: Secondary | ICD-10-CM | POA: Diagnosis not present

## 2020-02-21 DIAGNOSIS — K6389 Other specified diseases of intestine: Secondary | ICD-10-CM | POA: Diagnosis not present

## 2020-02-21 DIAGNOSIS — R112 Nausea with vomiting, unspecified: Secondary | ICD-10-CM | POA: Diagnosis not present

## 2020-02-21 DIAGNOSIS — K5900 Constipation, unspecified: Secondary | ICD-10-CM | POA: Diagnosis not present

## 2020-02-21 DIAGNOSIS — K50919 Crohn's disease, unspecified, with unspecified complications: Secondary | ICD-10-CM | POA: Diagnosis not present

## 2020-02-21 DIAGNOSIS — R1031 Right lower quadrant pain: Secondary | ICD-10-CM | POA: Diagnosis not present

## 2020-02-21 DIAGNOSIS — D72829 Elevated white blood cell count, unspecified: Secondary | ICD-10-CM | POA: Diagnosis not present

## 2020-02-21 DIAGNOSIS — K5730 Diverticulosis of large intestine without perforation or abscess without bleeding: Secondary | ICD-10-CM | POA: Diagnosis not present

## 2020-02-21 LAB — CBC WITH DIFFERENTIAL/PLATELET
Abs Immature Granulocytes: 0.05 10*3/uL (ref 0.00–0.07)
Basophils Absolute: 0 10*3/uL (ref 0.0–0.1)
Basophils Relative: 0 %
Eosinophils Absolute: 0.1 10*3/uL (ref 0.0–0.5)
Eosinophils Relative: 1 %
HCT: 38.2 % (ref 36.0–46.0)
Hemoglobin: 12.1 g/dL (ref 12.0–15.0)
Immature Granulocytes: 0 %
Lymphocytes Relative: 6 %
Lymphs Abs: 0.8 10*3/uL (ref 0.7–4.0)
MCH: 30.6 pg (ref 26.0–34.0)
MCHC: 31.7 g/dL (ref 30.0–36.0)
MCV: 96.5 fL (ref 80.0–100.0)
Monocytes Absolute: 1.1 10*3/uL — ABNORMAL HIGH (ref 0.1–1.0)
Monocytes Relative: 8 %
Neutro Abs: 11.1 10*3/uL — ABNORMAL HIGH (ref 1.7–7.7)
Neutrophils Relative %: 85 %
Platelets: 121 10*3/uL — ABNORMAL LOW (ref 150–400)
RBC: 3.96 MIL/uL (ref 3.87–5.11)
RDW: 15.6 % — ABNORMAL HIGH (ref 11.5–15.5)
WBC: 13.1 10*3/uL — ABNORMAL HIGH (ref 4.0–10.5)
nRBC: 0 % (ref 0.0–0.2)

## 2020-02-21 LAB — BASIC METABOLIC PANEL
Anion gap: 10 (ref 5–15)
BUN: 48 mg/dL — ABNORMAL HIGH (ref 8–23)
CO2: 22 mmol/L (ref 22–32)
Calcium: 9.5 mg/dL (ref 8.9–10.3)
Chloride: 103 mmol/L (ref 98–111)
Creatinine, Ser: 1.86 mg/dL — ABNORMAL HIGH (ref 0.44–1.00)
GFR, Estimated: 26 mL/min — ABNORMAL LOW (ref >60)
Glucose, Bld: 150 mg/dL — ABNORMAL HIGH (ref 70–99)
Potassium: 4.7 mmol/L (ref 3.5–5.1)
Sodium: 135 mmol/L (ref 135–145)

## 2020-02-21 MED ORDER — ONDANSETRON HCL 4 MG/2ML IJ SOLN
4.0000 mg | Freq: Once | INTRAMUSCULAR | Status: AC
  Administered 2020-02-21: 4 mg via INTRAVENOUS
  Filled 2020-02-21: qty 2

## 2020-02-21 MED ORDER — ONDANSETRON 8 MG PO TBDP: 8.0000 mg | ORAL_TABLET | Freq: Three times a day (TID) | ORAL | 0 refills | Status: AC | PRN

## 2020-02-21 MED ORDER — FENTANYL CITRATE (PF) 100 MCG/2ML IJ SOLN
50.0000 ug | Freq: Once | INTRAMUSCULAR | Status: AC
  Administered 2020-02-21: 50 ug via INTRAVENOUS
  Filled 2020-02-21: qty 2

## 2020-02-21 MED ORDER — SODIUM CHLORIDE 0.9 % IV SOLN: Freq: Once | INTRAVENOUS | Status: AC

## 2020-02-21 NOTE — ED Notes (Signed)
Gave patient gingerale for po challenge.

## 2020-02-21 NOTE — ED Provider Notes (Signed)
[facility] DEPT Provider Note: [name], MD, FACEP  CSN: [id number] MRN: [id number] ARRIVAL: 02/21/20 at [place]: [place]  Abdominal Pain   HISTORY OF PRESENT ILLNESS  02/21/20 5:41 AM KAROLEE MELONI is a 79 y.o. 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  She is here with abdominal pain that began yesterday evening about 10 PM.  It has been associated with nausea and vomiting (primarily retching) as well as abdominal distention.  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  She has had a small bowel obstruction in the past.  She rates her current pain is a 5 out of 10, diffuse in nature but most prominent in the right lower quadrant.  It is worse with palpation or movement.  She has not had diarrhea.   Past Medical History:  Diagnosis Date  . Abdominal wall hernia   . Anemia    "when lupus flares up"  . Anxiety   . Arthritis    "in my joints"  . Atrial fibrillation ([place])   . CAP (community acquired pneumonia) 08/20/2014  . Crohn's disease ([place])   . DVT of leg (deep venous thrombosis) ([place]) 2012-8./13/2013   "have had one in each leg now"  . DVT of lower extremity, bilateral ([place])    "have had them in both legs; last one was on the RLE this year" (04/25/2012)  . GERD (gastroesophageal reflux disease)   . Gout 2016  . History of hiatal hernia   . History of kidney stones   . Hypertension   . Hypothyroidism   . Lower GI bleed 2012  . Numbness and tingling of foot    bilaterally  . OSA on CPAP   . Refusal of blood transfusion for reasons of conscience 12/20/2011   pt is Jehovah's Witness  . Renal cell carcinoma   . Renal disorder    S/P nephrectomy; dialysis; "working fine now" (12/20/11)  . SBO (small bowel obstruction) ([place]) 11/2016  . SLE (systemic lupus erythematosus) ([place])       Past Surgical History:  Procedure Laterality Date  . [place]   "found sluggish bone marrow" (04/25/2012)  . BONE MARROW BIOPSY  1993; 1995  . BREAST EXCISIONAL BIOPSY     right  . CATARACT EXTRACTION W/ INTRAOCULAR LENS  IMPLANT, BILATERAL  2005-2010  . CESAREAN SECTION  1984  . CHOLECYSTECTOMY  1995  . COLECTOMY  1998   "removal of abscess" (04/25/2012)  . COLONOSCOPY N/A 08/30/2013   Procedure: COLONOSCOPY;  Surgeon: Winfield Cunas., MD;  Location: [facility];  Service: Endoscopy;  Laterality: N/A;  . COLONOSCOPY WITH PROPOFOL N/A 09/06/2017   Procedure: COLONOSCOPY WITH PROPOFOL;  Surgeon: Laurence Spates, MD;  Location: [facility];  Service: Endoscopy;  Laterality: N/A;  . COLOSTOMY  1998  . COLOSTOMY REVERSAL  1999   "6 months after placed" (04/25/2012)  . EXCISIONAL HEMORRHOIDECTOMY    . INSERTION OF DIALYSIS CATHETER  1988-2006   "peritoneal and hemodialysis; had multiple grafts and fistulas; last graft clotted off 2012"  . NEPHRECTOMY TRANSPLANTED ORGAN  2006   bilaterally  . PARATHYROID IMPLANT REMOVAL Left    removed parathyroid from neck and implanted in arm  . VENA CAVA FILTER PLACEMENT  2012    Family History  Problem Relation Age of Onset  . Heart disease Mother   .  Hypertension Other        runs in family  . Sleep apnea Sister     Social History   Tobacco Use  . Smoking status: Never Smoker  . Smokeless tobacco: Never Used  Vaping Use  . Vaping Use: Never used  Substance Use Topics  . Alcohol use: No  . Drug use: No    Prior to Admission medications   Medication Sig Start Date End Date Taking? Authorizing Provider  acetaminophen (TYLENOL) 325 MG tablet Take 325-650 mg by mouth every 6 (six) hours as needed for mild pain.     [provider]  allopurinol (ZYLOPRIM) 300 MG tablet Take 1 tablet (300 mg total) by mouth daily. 06/28/19   Ann Held, DO  Ascorbic Acid (VITAMIN C PO) Take 500 mg by  mouth daily.    [provider]  calcitRIOL (ROCALTROL) 0.25 MCG capsule Take 0.5 mcg by mouth daily. Per Pt states two daily.    [provider]  epoetin alfa (EPOGEN,PROCRIT) 37169 UNIT/ML injection Inject 40,000 Units into the skin every 21 ( twenty-one) days.     [provider]  felodipine (PLENDIL) 10 MG 24 hr tablet Take 10 mg by mouth every evening.     [provider]  folic acid (FOLVITE) 1 MG tablet Take 2 tablets (2 mg total) by mouth daily. 09/20/19   [place], Holli Humbles, NP  furosemide (LASIX) 80 MG tablet Take 0.5 tablets (40 mg total) by mouth daily. 06/23/19   Georgette Shell, MD  gabapentin (NEURONTIN) 100 MG capsule Take 2 capsules (200 mg total) by mouth at bedtime. 07/18/16   Ann Held, DO  levothyroxine (SYNTHROID, LEVOTHROID) 100 MCG tablet Take 1 tablet (100 mcg total) by mouth daily. 07/18/16   Ann Held, DO  Melatonin 10 MG CAPS Take 1 capsule by mouth at bedtime.    [provider]  metoprolol tartrate (LOPRESSOR) 50 MG tablet Take 50 mg by mouth 2 (two) times daily.    [provider]  mycophenolate (MYFORTIC) 180 MG EC tablet Take 360 mg by mouth 2 (two) times daily.      [provider]  omeprazole (PRILOSEC) 20 MG capsule Take 20 mg by mouth daily. 07/20/16   [provider]  ondansetron (ZOFRAN ODT) 8 MG disintegrating tablet Take 1 tablet (8 mg total) by mouth every 8 (eight) hours as needed for nausea or vomiting. 02/21/20   Dorie Rank, MD  polyethylene glycol [facility] / Floria Raveling) packet Take 17 g by mouth daily as needed for moderate constipation.     [provider]  Polyethylene Glycol 400 (BLINK TEARS) 0.25 % GEL Place 1 drop into both eyes 2 (two) times daily as needed (for dry eyes).    [provider]  predniSONE (DELTASONE) 5 MG tablet Take 5 mg by mouth daily.     [provider]  Probiotic Product (PROBIOTIC PO) Take 1 tablet by mouth  daily.     [provider]  rivaroxaban (XARELTO) 20 MG TABS tablet Take 1 tablet (20 mg total) by mouth daily with supper. 11/19/19   Ann Held, DO  senna (SENOKOT) 8.6 MG TABS tablet Take 2 tablets (17.2 mg total) by mouth at bedtime. 06/23/19   Georgette Shell, MD  tacrolimus (PROGRAF) 1 MG capsule Take 3 mg by mouth 2 (two) times daily.     [provider]  vitamin C (ASCORBIC ACID) 250 MG tablet Take  2 tablets (500 mg total) by mouth daily. 06/23/19   Georgette Shell, MD    Allergies Amoxicillin, Ampicillin, Ciprofloxacin, Erythromycin, Penicillins, Tetracycline, Labetalol, Sulfa antibiotics, and Sulfamethoxazole-trimethoprim   REVIEW OF SYSTEMS  Negative except as noted here or in the History of Present Illness.   PHYSICAL EXAMINATION  Initial Vital Signs Blood pressure (!) 141/76, pulse 78, temperature 98.4 F (36.9 C), temperature source Oral, resp. rate 18, height _0  (1.6 m), weight 59 kg, last menstrual period 03/05/2014, SpO2 100 %.  Examination General: Well-developed, well-nourished female in no acute distress; appearance consistent with age of record HENT: normocephalic; atraumatic Eyes: pupils equal, round and reactive to light; extraocular muscles intact Neck: supple Heart: regular rate and rhythm Lungs: clear to auscultation bilaterally Abdomen: soft; nondistended; tender right lower quadrant mass; bowel sounds hyperactive, high-pitched Extremities: No deformity; full range of motion; trace edema of lower legs Neurologic: Awake, alert and oriented; motor function intact in all extremities and symmetric; no facial droop Skin: Warm and dry Psychiatric: Normal mood and affect   RESULTS  Summary of this visit's results, reviewed and interpreted by myself:   EKG Interpretation  Date/Time:    Ventricular Rate:    PR Interval:    QRS Duration:   QT Interval:    QTC Calculation:   R Axis:     Text Interpretation:         Laboratory Studies: Results for orders placed or performed during the hospital encounter of 02/21/20 (from the past 24 hour(s))  CBC with Differential/Platelet     Status: Abnormal   Collection Time: 02/21/20  6:07 AM  Result Value Ref Range   WBC 13.1 (H) 4.0 - 10.5 K/uL   RBC 3.96 3.87 - 5.11 MIL/uL   Hemoglobin 12.1 12.0 - 15.0 g/dL   HCT 38.2 36 - 46 %   MCV 96.5 80.0 - 100.0 fL   MCH 30.6 26.0 - 34.0 pg   MCHC 31.7 30.0 - 36.0 g/dL   RDW 15.6 (H) 11.5 - 15.5 %   Platelets 121 (L) 150 - 400 K/uL   nRBC 0.0 0.0 - 0.2 %   Neutrophils Relative % 85 %   Neutro Abs 11.1 (H) 1.7 - 7.7 K/uL   Lymphocytes Relative 6 %   Lymphs Abs 0.8 0.7 - 4.0 K/uL   Monocytes Relative 8 %   Monocytes Absolute 1.1 (H) 0.1 - 1.0 K/uL   Eosinophils Relative 1 %   Eosinophils Absolute 0.1 0.0 - 0.5 K/uL   Basophils Relative 0 %   Basophils Absolute 0.0 0.0 - 0.1 K/uL   Immature Granulocytes 0 %   Abs Immature Granulocytes 0.05 0.00 - 0.07 K/uL  Basic metabolic panel     Status: Abnormal   Collection Time: 02/21/20  6:07 AM  Result Value Ref Range   Sodium 135 135 - 145 mmol/L   Potassium 4.7 3.5 - 5.1 mmol/L   Chloride 103 98 - 111 mmol/L   CO2 22 22 - 32 mmol/L   Glucose, Bld 150 (H) 70 - 99 mg/dL   BUN 48 (H) 8 - 23 mg/dL   Creatinine, Ser 1.86 (H) 0.44 - 1.00 mg/dL   Calcium 9.5 8.9 - 10.3 mg/dL   GFR, Estimated 26 (L) >60 mL/min   Anion gap 10 5 - 15   Imaging Studies: CT ABDOMEN PELVIS WO CONTRAST  Result Date: 02/21/2020 CLINICAL DATA:  Abdominal pain with nausea vomiting. EXAM: CT ABDOMEN AND PELVIS WITHOUT CONTRAST TECHNIQUE: Multidetector CT imaging  of the abdomen and pelvis was performed following the standard protocol without IV contrast. COMPARISON:  02/10/2018 FINDINGS: Lower chest: Subsegmental atelectasis noted in the lung bases. Hepatobiliary: Tiny hypodensity posterior right liver is too small to characterize but similar in the interval. Calcification along the dome of the  liver may be pleural or along the liver surface, but stable and benign appearing. Gallbladder surgically absent. No intrahepatic or extrahepatic biliary dilation. Pancreas: 2.9 x 1.4 cm subtle hypoattenuating lesion noted in the uncinate process of the pancreas, similar to prior and mildly progressive since 02/10/2018. No dilatation of the main duct. No intraparenchymal cyst. No peripancreatic edema. Spleen: No splenomegaly. No focal mass lesion. Adrenals/Urinary Tract: No adrenal nodule or mass. Status post bilateral nephrectomy right pelvic transplant kidney evident without hydronephrosis. Stomach/Bowel: Stomach is unremarkable. No gastric wall thickening. No evidence of outlet obstruction. Duodenum is normally positioned as is the ligament of Treitz. Proximal small bowel is nondilated. Small bowel loops in the pelvis are fluid-filled and show some mild distention up to about 2.4 cm diameter. Fecalization of small bowel contents noted right lower quadrant. No gross colonic mass. No colonic wall thickening. Diverticular changes are noted in the left colon without evidence of diverticulitis. Vascular/Lymphatic: There is abdominal aortic atherosclerosis without aneurysm. IVC filter visualized in situ. There is no gastrohepatic or hepatoduodenal ligament lymphadenopathy. No retroperitoneal or mesenteric lymphadenopathy. No pelvic sidewall lymphadenopathy. Reproductive: The uterus is unremarkable.  There is no adnexal mass. Other: No intraperitoneal free fluid. Musculoskeletal: Anterior abdominal wall laxity evident. Supraumbilical midline ventral hernia contains only fat. Heterogeneous bony mineralization is stable with sclerotic right sacral and iliac lesions, likely bone islands. IMPRESSION: 1. Mild distal small bowel distension with fecalization of enteric contents. Imaging features not highly suggestive of high-grade small bowel mechanical obstruction although underlying component partial obstruction or ileus a  consideration. 2. 2.9 x 1.4 cm subtle hypoattenuating lesion in the uncinate process of the pancreas, similar to prior and mildly progressive since 02/10/2018. Potential cyst/pseudocyst or cystic neoplasm. MRI of the abdomen without and with contrast recommended to further evaluate. 3. Left colonic diverticulosis without diverticulitis. 4. Aortic Atherosclerosis (ICD10-I70.0). Electronically Signed   By: Misty Stanley M.D.   On: 02/21/2020 06:43    ED COURSE and MDM  Nursing notes, initial and subsequent vitals signs, including pulse oximetry, reviewed and interpreted by myself.  Vitals:   02/21/20 0537 02/21/20 0538 02/21/20 0745  BP:  (!) 141/76 128/68  Pulse:  78 64  Resp:  18 18  Temp:  98.4 F (36.9 C) 98.1 F (36.7 C)  TempSrc:  Oral Oral  SpO2:  100% 96%  Weight: 59 kg    Height: _0  (1.6 m)     Medications  0.9 %  sodium chloride infusion ( Intravenous Stopped 02/21/20 0821)  ondansetron (ZOFRAN) injection 4 mg (4 mg Intravenous Given 02/21/20 0619)  fentaNYL (SUBLIMAZE) injection 50 mcg (50 mcg Intravenous Given 02/21/20 0619)   6:52 AM Patient's nausea and pain well controlled with IV medications.  CT scan equivocal for bowel obstruction.  Will observe patient and attempt oral trial.  7:00 AM Signed out to Dr. Tomi Bamberger.   PROCEDURES  Procedures   ED DIAGNOSES     ICD-10-CM   1. Abdominal pain, unspecified abdominal location  R10.9        Ruqaya Strauss, MD 02/21/20 2240

## 2020-02-21 NOTE — Discharge Instructions (Addendum)
Return to the ER if you start having recurrent vomiting or pain.  Follow-up with your doctor next week to be rechecked if you have any persistent symptoms

## 2020-02-21 NOTE — ED Provider Notes (Signed)
Patient initially seen by Dr. Florina Ou.  Please see his note.  Patient CT scan showed moderate distention of the distal small bowel.  No definite signs of obstruction but partial obstruction or ileus is possible.  Patient was feeling better after ED treatment.  Plan was to do a p.o. challenge and if able to eat and drink discharge.  If the patient had any recurrent nausea vomiting or pain to admit.  Patient has been able to drink fluids.  She states she feels well and is not having any recurrent pain or vomiting.  I discussed the CT scan findings with her and will plan on discharge at this time.  Instructed the patient to return if she has any recurrent vomiting or abdominal pain.   Dorie Rank, MD 02/21/20 [phone]

## 2020-02-21 NOTE — ED Notes (Signed)
Patient transported to CT 

## 2020-02-21 NOTE — ED Triage Notes (Signed)
Pt reports abd pain with n/v since last pm.

## 2020-02-24 ENCOUNTER — Other Ambulatory Visit: Payer: Self-pay

## 2020-02-24 ENCOUNTER — Encounter: Payer: Self-pay | Admitting: Cardiovascular Disease

## 2020-02-24 ENCOUNTER — Ambulatory Visit (INDEPENDENT_AMBULATORY_CARE_PROVIDER_SITE_OTHER): Payer: Medicare Other | Admitting: Cardiovascular Disease

## 2020-02-24 VITALS — BP 142/72 | HR 77 | Ht 63.0 in | Wt 130.4 lb

## 2020-02-24 DIAGNOSIS — I351 Nonrheumatic aortic (valve) insufficiency: Secondary | ICD-10-CM

## 2020-02-24 DIAGNOSIS — I482 Chronic atrial fibrillation, unspecified: Secondary | ICD-10-CM | POA: Diagnosis not present

## 2020-02-24 NOTE — Patient Instructions (Addendum)
Medication Instructions:  *If you need a refill on your cardiac medications before your next appointment, please call your pharmacy*  Lab Work: If you have labs (blood work) drawn today and your tests are completely normal, you will receive your results only by: Marland Kitchen MyChart Message (if you have MyChart) OR . A paper copy in the mail If you have any lab test that is abnormal or we need to change your treatment, we will call you to review the results.  Testing/Procedures: None ordered today.   Follow-Up: At [facility], you and your health needs are our priority.  As part of our continuing mission to provide you with exceptional heart care, we have created designated Provider Care Teams.  These Care Teams include your primary Cardiologist (physician) and Advanced Practice Providers (APPs -  Physician Assistants and Nurse Practitioners) who all work together to provide you with the care you need, when you need it.  We recommend signing up for the patient portal called "MyChart".  Sign up information is provided on this After Visit Summary.  MyChart is used to connect with patients for Virtual Visits (Telemedicine).  Patients are able to view lab/test results, encounter notes, upcoming appointments, etc.  Non-urgent messages can be sent to your provider as well.   To learn more about what you can do with MyChart, go to [email or web address].    Your next appointment:   12 month(s)  The format for your next appointment:   In Person  Provider:   You may see Dr. Johnsie Cancel or one of the following Advanced Practice Providers on your designated Care Team:    Truitt Merle, NP  Cecilie Kicks, NP  Kathyrn Drown, NP

## 2020-03-05 ENCOUNTER — Other Ambulatory Visit: Payer: Self-pay

## 2020-03-05 ENCOUNTER — Encounter (HOSPITAL_COMMUNITY)
Admission: RE | Admit: 2020-03-05 | Discharge: 2020-03-05 | Disposition: A | Discharge status: Home / Self Care | Payer: Medicare Other | Source: Ambulatory Visit | Attending: Nephrology | Admitting: Nephrology

## 2020-03-05 VITALS — BP 140/69 | HR 62 | Temp 98.0°F | Resp 20

## 2020-03-05 DIAGNOSIS — D631 Anemia in chronic kidney disease: Secondary | ICD-10-CM | POA: Diagnosis not present

## 2020-03-05 DIAGNOSIS — N183 Chronic kidney disease, stage 3 unspecified: Secondary | ICD-10-CM | POA: Diagnosis not present

## 2020-03-05 LAB — CBC WITH DIFFERENTIAL/PLATELET
Abs Immature Granulocytes: 0.03 10*3/uL (ref 0.00–0.07)
Basophils Absolute: 0 10*3/uL (ref 0.0–0.1)
Basophils Relative: 0 %
Eosinophils Absolute: 0.2 10*3/uL (ref 0.0–0.5)
Eosinophils Relative: 2 %
HCT: 38.2 % (ref 36.0–46.0)
Hemoglobin: 11.7 g/dL — ABNORMAL LOW (ref 12.0–15.0)
Immature Granulocytes: 0 %
Lymphocytes Relative: 10 %
Lymphs Abs: 0.9 10*3/uL (ref 0.7–4.0)
MCH: 29.4 pg (ref 26.0–34.0)
MCHC: 30.6 g/dL (ref 30.0–36.0)
MCV: 96 fL (ref 80.0–100.0)
Monocytes Absolute: 0.6 10*3/uL (ref 0.1–1.0)
Monocytes Relative: 6 %
Neutro Abs: 7.7 10*3/uL (ref 1.7–7.7)
Neutrophils Relative %: 82 %
Platelets: 122 10*3/uL — ABNORMAL LOW (ref 150–400)
RBC: 3.98 MIL/uL (ref 3.87–5.11)
RDW: 15.3 % (ref 11.5–15.5)
WBC: 9.5 10*3/uL (ref 4.0–10.5)
nRBC: 0 % (ref 0.0–0.2)

## 2020-03-05 LAB — RENAL FUNCTION PANEL
Albumin: 4 g/dL (ref 3.5–5.0)
Anion gap: 12 (ref 5–15)
BUN: 53 mg/dL — ABNORMAL HIGH (ref 8–23)
CO2: 22 mmol/L (ref 22–32)
Calcium: 9.4 mg/dL (ref 8.9–10.3)
Chloride: 103 mmol/L (ref 98–111)
Creatinine, Ser: 2.19 mg/dL — ABNORMAL HIGH (ref 0.44–1.00)
GFR, Estimated: 23 mL/min — ABNORMAL LOW (ref >60)
Glucose, Bld: 116 mg/dL — ABNORMAL HIGH (ref 70–99)
Phosphorus: 3.9 mg/dL (ref 2.5–4.6)
Potassium: 4.3 mmol/L (ref 3.5–5.1)
Sodium: 137 mmol/L (ref 135–145)

## 2020-03-05 LAB — VITAMIN D 25 HYDROXY (VIT D DEFICIENCY, FRACTURES): Vit D, 25-Hydroxy: 9.67 ng/mL — ABNORMAL LOW (ref 30–100)

## 2020-03-05 LAB — MAGNESIUM: Magnesium: 1.8 mg/dL (ref 1.7–2.4)

## 2020-03-05 LAB — IRON AND TIBC
Iron: 107 ug/dL (ref 28–170)
Saturation Ratios: 49 % — ABNORMAL HIGH (ref 10.4–31.8)
TIBC: 218 ug/dL — ABNORMAL LOW (ref 250–450)
UIBC: 111 ug/dL

## 2020-03-05 LAB — POCT HEMOGLOBIN-HEMACUE: Hemoglobin: 12.1 g/dL (ref 12.0–15.0)

## 2020-03-05 LAB — FERRITIN: Ferritin: 1200 ng/mL — ABNORMAL HIGH (ref 11–307)

## 2020-03-05 MED ORDER — EPOETIN ALFA 10000 UNIT/ML IJ SOLN: 30000.0000 [IU] | INTRAMUSCULAR | Status: DC

## 2020-03-06 LAB — PTH, INTACT AND CALCIUM
Calcium, Total (PTH): 9.3 mg/dL (ref 8.7–10.3)
PTH: 49 pg/mL (ref 15–65)

## 2020-03-09 DIAGNOSIS — N184 Chronic kidney disease, stage 4 (severe): Secondary | ICD-10-CM | POA: Diagnosis not present

## 2020-03-09 DIAGNOSIS — D631 Anemia in chronic kidney disease: Secondary | ICD-10-CM | POA: Diagnosis not present

## 2020-03-09 DIAGNOSIS — I129 Hypertensive chronic kidney disease with stage 1 through stage 4 chronic kidney disease, or unspecified chronic kidney disease: Secondary | ICD-10-CM | POA: Diagnosis not present

## 2020-03-09 DIAGNOSIS — N2581 Secondary hyperparathyroidism of renal origin: Secondary | ICD-10-CM | POA: Diagnosis not present

## 2020-03-09 DIAGNOSIS — Z940 Kidney transplant status: Secondary | ICD-10-CM | POA: Diagnosis not present

## 2020-03-12 DIAGNOSIS — Z23 Encounter for immunization: Secondary | ICD-10-CM | POA: Diagnosis not present

## 2020-03-12 DIAGNOSIS — Z940 Kidney transplant status: Secondary | ICD-10-CM | POA: Diagnosis not present

## 2020-03-19 ENCOUNTER — Other Ambulatory Visit: Payer: Self-pay

## 2020-03-19 ENCOUNTER — Ambulatory Visit (HOSPITAL_COMMUNITY)
Admission: RE | Admit: 2020-03-19 | Discharge: 2020-03-19 | Disposition: A | Discharge status: Home / Self Care | Payer: Medicare Other | Source: Ambulatory Visit | Attending: Nephrology | Admitting: Nephrology

## 2020-03-19 VITALS — BP 145/68 | HR 65 | Temp 97.0°F | Resp 20

## 2020-03-19 DIAGNOSIS — N183 Chronic kidney disease, stage 3 unspecified: Secondary | ICD-10-CM | POA: Insufficient documentation

## 2020-03-19 LAB — POCT HEMOGLOBIN-HEMACUE: Hemoglobin: 11.1 g/dL — ABNORMAL LOW (ref 12.0–15.0)

## 2020-03-19 MED ORDER — EPOETIN ALFA 10000 UNIT/ML IJ SOLN: 30000.0000 [IU] | INTRAMUSCULAR | Status: DC

## 2020-03-19 MED ORDER — EPOETIN ALFA 20000 UNIT/ML IJ SOLN
INTRAMUSCULAR | Status: AC
  Administered 2020-03-19: 30000 [IU] via SUBCUTANEOUS
  Filled 2020-03-19: qty 1

## 2020-03-19 MED ORDER — EPOETIN ALFA 10000 UNIT/ML IJ SOLN
INTRAMUSCULAR | Status: AC
  Filled 2020-03-19: qty 1

## 2020-03-20 MED FILL — Epoetin Alfa Inj 20000 Unit/ML: INTRAMUSCULAR | Qty: 1 | Status: AC

## 2020-03-20 MED FILL — Epoetin Alfa Inj 10000 Unit/ML: INTRAMUSCULAR | Qty: 1 | Status: AC

## 2020-03-26 ENCOUNTER — Other Ambulatory Visit: Payer: Self-pay | Admitting: Gastroenterology

## 2020-03-26 DIAGNOSIS — K869 Disease of pancreas, unspecified: Secondary | ICD-10-CM

## 2020-03-26 DIAGNOSIS — Z8719 Personal history of other diseases of the digestive system: Secondary | ICD-10-CM

## 2020-04-03 ENCOUNTER — Encounter (HOSPITAL_COMMUNITY): Payer: Medicare Other

## 2020-04-06 ENCOUNTER — Other Ambulatory Visit: Payer: Self-pay | Admitting: Gastroenterology

## 2020-04-06 DIAGNOSIS — K869 Disease of pancreas, unspecified: Secondary | ICD-10-CM

## 2020-04-06 DIAGNOSIS — Z8719 Personal history of other diseases of the digestive system: Secondary | ICD-10-CM

## 2020-04-09 ENCOUNTER — Other Ambulatory Visit: Payer: Self-pay

## 2020-04-09 ENCOUNTER — Encounter (HOSPITAL_COMMUNITY)
Admission: RE | Admit: 2020-04-09 | Discharge: 2020-04-09 | Disposition: A | Discharge status: Home / Self Care | Payer: Medicare Other | Source: Ambulatory Visit | Attending: Nephrology | Admitting: Nephrology

## 2020-04-09 VITALS — BP 149/90 | HR 73 | Temp 97.2°F | Resp 20

## 2020-04-09 DIAGNOSIS — D631 Anemia in chronic kidney disease: Secondary | ICD-10-CM | POA: Diagnosis not present

## 2020-04-09 DIAGNOSIS — N183 Chronic kidney disease, stage 3 unspecified: Secondary | ICD-10-CM | POA: Diagnosis not present

## 2020-04-09 LAB — RENAL FUNCTION PANEL
Albumin: 3.8 g/dL (ref 3.5–5.0)
Anion gap: 11 (ref 5–15)
BUN: 49 mg/dL — ABNORMAL HIGH (ref 8–23)
CO2: 22 mmol/L (ref 22–32)
Calcium: 9.4 mg/dL (ref 8.9–10.3)
Chloride: 105 mmol/L (ref 98–111)
Creatinine, Ser: 1.93 mg/dL — ABNORMAL HIGH (ref 0.44–1.00)
GFR, Estimated: 27 mL/min — ABNORMAL LOW (ref >60)
Glucose, Bld: 104 mg/dL — ABNORMAL HIGH (ref 70–99)
Phosphorus: 3.8 mg/dL (ref 2.5–4.6)
Potassium: 4.2 mmol/L (ref 3.5–5.1)
Sodium: 138 mmol/L (ref 135–145)

## 2020-04-09 LAB — IRON AND TIBC
Iron: 173 ug/dL — ABNORMAL HIGH (ref 28–170)
Saturation Ratios: 82 % — ABNORMAL HIGH (ref 10.4–31.8)
TIBC: 210 ug/dL — ABNORMAL LOW (ref 250–450)
UIBC: 37 ug/dL

## 2020-04-09 LAB — FERRITIN: Ferritin: 963 ng/mL — ABNORMAL HIGH (ref 11–307)

## 2020-04-09 LAB — PROTIME-INR
INR: 1.6 — ABNORMAL HIGH (ref 0.8–1.2)
Prothrombin Time: 18.2 seconds — ABNORMAL HIGH (ref 11.4–15.2)

## 2020-04-09 LAB — POCT HEMOGLOBIN-HEMACUE: Hemoglobin: 11.1 g/dL — ABNORMAL LOW (ref 12.0–15.0)

## 2020-04-09 MED ORDER — EPOETIN ALFA 10000 UNIT/ML IJ SOLN
INTRAMUSCULAR | Status: AC
  Filled 2020-04-09: qty 1

## 2020-04-09 MED ORDER — EPOETIN ALFA 10000 UNIT/ML IJ SOLN
30000.0000 [IU] | INTRAMUSCULAR | Status: DC
  Administered 2020-04-09: 30000 [IU] via SUBCUTANEOUS

## 2020-04-09 MED ORDER — EPOETIN ALFA 20000 UNIT/ML IJ SOLN
INTRAMUSCULAR | Status: AC
  Filled 2020-04-09: qty 1

## 2020-04-10 ENCOUNTER — Telehealth: Payer: Self-pay | Admitting: Family Medicine

## 2020-04-10 MED FILL — Epoetin Alfa Inj 20000 Unit/ML: INTRAMUSCULAR | Qty: 1 | Status: AC

## 2020-04-10 MED FILL — Epoetin Alfa Inj 10000 Unit/ML: INTRAMUSCULAR | Qty: 1 | Status: AC

## 2020-04-10 NOTE — Telephone Encounter (Signed)
Received

## 2020-04-10 NOTE — Telephone Encounter (Signed)
Pt husband brought in [place] form to be exempt.  Forms place into [place] bin  [phone] call for questions if needed

## 2020-04-13 NOTE — Telephone Encounter (Signed)
Placed in [place] folder to review

## 2020-04-15 NOTE — Telephone Encounter (Signed)
Spoke with husband. Form placed in outgoing mail

## 2020-04-23 ENCOUNTER — Encounter (HOSPITAL_COMMUNITY)
Admission: RE | Admit: 2020-04-23 | Discharge: 2020-04-23 | Disposition: A | Discharge status: Home / Self Care | Payer: Medicare Other | Source: Ambulatory Visit | Attending: Nephrology | Admitting: Nephrology

## 2020-04-23 ENCOUNTER — Other Ambulatory Visit: Payer: Self-pay

## 2020-04-23 VITALS — BP 161/69 | HR 69 | Temp 97.5°F | Resp 20

## 2020-04-23 DIAGNOSIS — N183 Chronic kidney disease, stage 3 unspecified: Secondary | ICD-10-CM

## 2020-04-23 DIAGNOSIS — D631 Anemia in chronic kidney disease: Secondary | ICD-10-CM | POA: Diagnosis not present

## 2020-04-23 LAB — POCT HEMOGLOBIN-HEMACUE: Hemoglobin: 10.8 g/dL — ABNORMAL LOW (ref 12.0–15.0)

## 2020-04-23 MED ORDER — EPOETIN ALFA 10000 UNIT/ML IJ SOLN
30000.0000 [IU] | INTRAMUSCULAR | Status: DC
  Administered 2020-04-23: 14:00:00 30000 [IU] via SUBCUTANEOUS

## 2020-04-23 MED ORDER — EPOETIN ALFA 10000 UNIT/ML IJ SOLN
INTRAMUSCULAR | Status: AC
  Filled 2020-04-23: qty 1

## 2020-04-23 MED ORDER — EPOETIN ALFA 20000 UNIT/ML IJ SOLN
INTRAMUSCULAR | Status: AC
  Filled 2020-04-23: qty 1

## 2020-04-24 MED FILL — Epoetin Alfa Inj 20000 Unit/ML: INTRAMUSCULAR | Qty: 1 | Status: AC

## 2020-04-24 MED FILL — Epoetin Alfa Inj 10000 Unit/ML: INTRAMUSCULAR | Qty: 1 | Status: AC

## 2020-04-27 ENCOUNTER — Other Ambulatory Visit: Payer: Self-pay

## 2020-04-27 ENCOUNTER — Other Ambulatory Visit: Payer: Medicare Other

## 2020-04-27 ENCOUNTER — Encounter: Payer: Self-pay | Admitting: Family Medicine

## 2020-04-27 ENCOUNTER — Ambulatory Visit (INDEPENDENT_AMBULATORY_CARE_PROVIDER_SITE_OTHER): Payer: Medicare Other | Admitting: Family Medicine

## 2020-04-27 ENCOUNTER — Inpatient Hospital Stay: Admission: RE | Admit: 2020-04-27 | Payer: Medicare Other | Source: Ambulatory Visit

## 2020-04-27 VITALS — BP 132/64 | HR 76 | Temp 98.1°F | Ht 63.0 in | Wt 129.5 lb

## 2020-04-27 DIAGNOSIS — M545 Low back pain, unspecified: Secondary | ICD-10-CM | POA: Diagnosis not present

## 2020-04-27 MED ORDER — HYDROCODONE-ACETAMINOPHEN 5-325 MG PO TABS: 1.0000 | ORAL_TABLET | Freq: Two times a day (BID) | ORAL | 0 refills | Status: AC | PRN

## 2020-04-27 MED ORDER — PREDNISONE 20 MG PO TABS: 40.0000 mg | ORAL_TABLET | Freq: Every day | ORAL | 0 refills | Status: AC

## 2020-04-27 NOTE — Patient Instructions (Signed)
Heat (pad or rice pillow in microwave) over affected area, 10-15 minutes twice daily.   Ice/cold pack over area for 10-15 min twice daily.  Do not drink alcohol, do any illicit/street drugs, drive or do anything that requires alertness while on this medicine.   Let us know if you need anything.  EXERCISES  RANGE OF MOTION (ROM) AND STRETCHING EXERCISES - Low Back Pain Most people with lower back pain will find that their symptoms get worse with excessive bending forward (flexion) or arching at the lower back (extension). The exercises that will help resolve your symptoms will focus on the opposite motion.  If you have pain, numbness or tingling which travels down into your buttocks, leg or foot, the goal of the therapy is for these symptoms to move closer to your back and eventually resolve. Sometimes, these leg symptoms will get better, but your lower back pain may worsen. This is often an indication of progress in your rehabilitation. Be very alert to any changes in your symptoms and the activities in which you participated in the 24 hours prior to the change. Sharing this information with your caregiver will allow him or her to most efficiently treat your condition. These exercises may help you when beginning to rehabilitate your injury. Your symptoms may resolve with or without further involvement from your physician, physical therapist or athletic trainer. While completing these exercises, remember:   Restoring tissue flexibility helps normal motion to return to the joints. This allows healthier, less painful movement and activity.  An effective stretch should be held for at least 30 seconds.  A stretch should never be painful. You should only feel a gentle lengthening or release in the stretched tissue. FLEXION RANGE OF MOTION AND STRETCHING EXERCISES:  STRETCH - Flexion, Single Knee to Chest   Lie on a firm bed or floor with both legs extended in front of you.  Keeping one leg in  contact with the floor, bring your opposite knee to your chest. Hold your leg in place by either grabbing behind your thigh or at your knee.  Pull until you feel a gentle stretch in your low back. Hold 30 seconds.  Slowly release your grasp and repeat the exercise with the opposite side. Repeat 2 times. Complete this exercise 3 times per week.   STRETCH - Flexion, Double Knee to Chest  Lie on a firm bed or floor with both legs extended in front of you.  Keeping one leg in contact with the floor, bring your opposite knee to your chest.  Tense your stomach muscles to support your back and then lift your other knee to your chest. Hold your legs in place by either grabbing behind your thighs or at your knees.  Pull both knees toward your chest until you feel a gentle stretch in your low back. Hold 30 seconds.  Tense your stomach muscles and slowly return one leg at a time to the floor. Repeat 2 times. Complete this exercise 3 times per week.   STRETCH - Low Trunk Rotation  Lie on a firm bed or floor. Keeping your legs in front of you, bend your knees so they are both pointed toward the ceiling and your feet are flat on the floor.  Extend your arms out to the side. This will stabilize your upper body by keeping your shoulders in contact with the floor.  Gently and slowly drop both knees together to one side until you feel a gentle stretch in your low back.  Hold for 30 seconds.  Tense your stomach muscles to support your lower back as you bring your knees back to the starting position. Repeat the exercise to the other side. Repeat 2 times. Complete this exercise at least 3 times per week.   EXTENSION RANGE OF MOTION AND FLEXIBILITY EXERCISES:  STRETCH - Extension, Prone on Elbows   Lie on your stomach on the floor, a bed will be too soft. Place your palms about shoulder width apart and at the height of your head.  Place your elbows under your shoulders. If this is too painful, stack  pillows under your chest.  Allow your body to relax so that your hips drop lower and make contact more completely with the floor.  Hold this position for 30 seconds.  Slowly return to lying flat on the floor. Repeat 2 times. Complete this exercise 3 times per week.   RANGE OF MOTION - Extension, Prone Press Ups  Lie on your stomach on the floor, a bed will be too soft. Place your palms about shoulder width apart and at the height of your head.  Keeping your back as relaxed as possible, slowly straighten your elbows while keeping your hips on the floor. You may adjust the placement of your hands to maximize your comfort. As you gain motion, your hands will come more underneath your shoulders.  Hold this position 30 seconds.  Slowly return to lying flat on the floor. Repeat 2 times. Complete this exercise 3 times per week.   RANGE OF MOTION- Quadruped, Neutral Spine   Assume a hands and knees position on a firm surface. Keep your hands under your shoulders and your knees under your hips. You may place padding under your knees for comfort.  Drop your head and point your tailbone toward the ground below you. This will round out your lower back like an angry cat. Hold this position for 30 seconds.  Slowly lift your head and release your tail bone so that your back sags into a large arch, like an old horse.  Hold this position for 30 seconds.  Repeat this until you feel limber in your low back.  Now, find your "sweet spot." This will be the most comfortable position somewhere between the two previous positions. This is your neutral spine. Once you have found this position, tense your stomach muscles to support your low back.  Hold this position for 30 seconds. Repeat 2 times. Complete this exercise 3 times per week.   STRENGTHENING EXERCISES - Low Back Sprain These exercises may help you when beginning to rehabilitate your injury. These exercises should be done near your "sweet spot."  This is the neutral, low-back arch, somewhere between fully rounded and fully arched, that is your least painful position. When performed in this safe range of motion, these exercises can be used for people who have either a flexion or extension based injury. These exercises may resolve your symptoms with or without further involvement from your physician, physical therapist or athletic trainer. While completing these exercises, remember:   Muscles can gain both the endurance and the strength needed for everyday activities through controlled exercises.  Complete these exercises as instructed by your physician, physical therapist or athletic trainer. Increase the resistance and repetitions only as guided.  You may experience muscle soreness or fatigue, but the pain or discomfort you are trying to eliminate should never worsen during these exercises. If this pain does worsen, stop and make certain you are following the directions  exactly. If the pain is still present after adjustments, discontinue the exercise until you can discuss the trouble with your caregiver.  STRENGTHENING - Deep Abdominals, Pelvic Tilt   Lie on a firm bed or floor. Keeping your legs in front of you, bend your knees so they are both pointed toward the ceiling and your feet are flat on the floor.  Tense your lower abdominal muscles to press your low back into the floor. This motion will rotate your pelvis so that your tail bone is scooping upwards rather than pointing at your feet or into the floor. With a gentle tension and even breathing, hold this position for 3 seconds. Repeat 2 times. Complete this exercise 3 times per week.   STRENGTHENING - Abdominals, Crunches   Lie on a firm bed or floor. Keeping your legs in front of you, bend your knees so they are both pointed toward the ceiling and your feet are flat on the floor. Cross your arms over your chest.  Slightly tip your chin down without bending your neck.  Tense  your abdominals and slowly lift your trunk high enough to just clear your shoulder blades. Lifting higher can put excessive stress on the lower back and does not further strengthen your abdominal muscles.  Control your return to the starting position. Repeat 2 times. Complete this exercise 3 times per week.   STRENGTHENING - Quadruped, Opposite UE/LE Lift   Assume a hands and knees position on a firm surface. Keep your hands under your shoulders and your knees under your hips. You may place padding under your knees for comfort.  Find your neutral spine and gently tense your abdominal muscles so that you can maintain this position. Your shoulders and hips should form a rectangle that is parallel with the floor and is not twisted.  Keeping your trunk steady, lift your right hand no higher than your shoulder and then your left leg no higher than your hip. Make sure you are not holding your breath. Hold this position for 30 seconds.  Continuing to keep your abdominal muscles tense and your back steady, slowly return to your starting position. Repeat with the opposite arm and leg. Repeat 2 times. Complete this exercise 3 times per week.   STRENGTHENING - Abdominals and Quadriceps, Straight Leg Raise   Lie on a firm bed or floor with both legs extended in front of you.  Keeping one leg in contact with the floor, bend the other knee so that your foot can rest flat on the floor.  Find your neutral spine, and tense your abdominal muscles to maintain your spinal position throughout the exercise.  Slowly lift your straight leg off the floor about 6 inches for a count of 3, making sure to not hold your breath.  Still keeping your neutral spine, slowly lower your leg all the way to the floor. Repeat this exercise with each leg 2 times. Complete this exercise 3 times per week.  POSTURE AND BODY MECHANICS CONSIDERATIONS - Low Back Sprain Keeping correct posture when sitting, standing or completing  your activities will reduce the stress put on different body tissues, allowing injured tissues a chance to heal and limiting painful experiences. The following are general guidelines for improved posture.  While reading these guidelines, remember:  The exercises prescribed by your provider will help you have the flexibility and strength to maintain correct postures.  The correct posture provides the best environment for your joints to work. All of your joints have  less wear and tear when properly supported by a spine with good posture. This means you will experience a healthier, less painful body.  Correct posture must be practiced with all of your activities, especially prolonged sitting and standing. Correct posture is as important when doing repetitive low-stress activities (typing) as it is when doing a single heavy-load activity (lifting).  RESTING POSITIONS Consider which positions are most painful for you when choosing a resting position. If you have pain with flexion-based activities (sitting, bending, stooping, squatting), choose a position that allows you to rest in a less flexed posture. You would want to avoid curling into a fetal position on your side. If your pain worsens with extension-based activities (prolonged standing, working overhead), avoid resting in an extended position such as sleeping on your stomach. Most people will find more comfort when they rest with their spine in a more neutral position, neither too rounded nor too arched. Lying on a non-sagging bed on your side with a pillow between your knees, or on your back with a pillow under your knees will often provide some relief. Keep in mind, being in any one position for a prolonged period of time, no matter how correct your posture, can still lead to stiffness.  PROPER SITTING POSTURE In order to minimize stress and discomfort on your spine, you must sit with correct posture. Sitting with good posture should be effortless for  a healthy body. Returning to good posture is a gradual process. Many people can work toward this most comfortably by using various supports until they have the flexibility and strength to maintain this posture on their own. When sitting with proper posture, your ears will fall over your shoulders and your shoulders will fall over your hips. You should use the back of the chair to support your upper back. Your lower back will be in a neutral position, just slightly arched. You may place a small pillow or folded towel at the base of your lower back for  support.  When working at a desk, create an environment that supports good, upright posture. Without extra support, muscles tire, which leads to excessive strain on joints and other tissues. Keep these recommendations in mind:  CHAIR:  A chair should be able to slide under your desk when your back makes contact with the back of the chair. This allows you to work closely.  The chair's height should allow your eyes to be level with the upper part of your monitor and your hands to be slightly lower than your elbows.  BODY POSITION  Your feet should make contact with the floor. If this is not possible, use a foot rest.  Keep your ears over your shoulders. This will reduce stress on your neck and low back.  INCORRECT SITTING POSTURES  If you are feeling tired and unable to assume a healthy sitting posture, do not slouch or slump. This puts excessive strain on your back tissues, causing more damage and pain. Healthier options include:  Using more support, like a lumbar pillow.  Switching tasks to something that requires you to be upright or walking.  Talking a brief walk.  Lying down to rest in a neutral-spine position.  PROLONGED STANDING WHILE SLIGHTLY LEANING FORWARD  When completing a task that requires you to lean forward while standing in one place for a long time, place either foot up on a stationary 2-4 inch high object to help maintain  the best posture. When both feet are on the ground, the  lower back tends to lose its slight inward curve. If this curve flattens (or becomes too large), then the back and your other joints will experience too much stress, tire more quickly, and can cause pain.  CORRECT STANDING POSTURES Proper standing posture should be assumed with all daily activities, even if they only take a few moments, like when brushing your teeth. As in sitting, your ears should fall over your shoulders and your shoulders should fall over your hips. You should keep a slight tension in your abdominal muscles to brace your spine. Your tailbone should point down to the ground, not behind your body, resulting in an over-extended swayback posture.   INCORRECT STANDING POSTURES  Common incorrect standing postures include a forward head, locked knees and/or an excessive swayback. WALKING Walk with an upright posture. Your ears, shoulders and hips should all line-up.  PROLONGED ACTIVITY IN A FLEXED POSITION When completing a task that requires you to bend forward at your waist or lean over a low surface, try to find a way to stabilize 3 out of 4 of your limbs. You can place a hand or elbow on your thigh or rest a knee on the surface you are reaching across. This will provide you more stability, so that your muscles do not tire as quickly. By keeping your knees relaxed, or slightly bent, you will also reduce stress across your lower back. CORRECT LIFTING TECHNIQUES  DO :  Assume a wide stance. This will provide you more stability and the opportunity to get as close as possible to the object which you are lifting.  Tense your abdominals to brace your spine. Bend at the knees and hips. Keeping your back locked in a neutral-spine position, lift using your leg muscles. Lift with your legs, keeping your back straight.  Test the weight of unknown objects before attempting to lift them.  Try to keep your elbows locked down at your  sides in order get the best strength from your shoulders when carrying an object.     Always ask for help when lifting heavy or awkward objects. INCORRECT LIFTING TECHNIQUES DO NOT:   Lock your knees when lifting, even if it is a small object.  Bend and twist. Pivot at your feet or move your feet when needing to change directions.  Assume that you can safely pick up even a paperclip without proper posture.

## 2020-04-27 NOTE — Progress Notes (Signed)
Musculoskeletal Exam  Patient: [name] DOB: January 26, 1941  DOS: 04/27/2020  SUBJECTIVE:  Chief Complaint:   Chief Complaint  Patient presents with  . Hip Pain    Back pain     [name] is a 79 y.o.  female for evaluation and treatment of R hip/back pain. Here w spouse.   Onset:  4 days ago. No inj or change in activity.  Location: R lower back/outer hip pain Character:  aching and dull  Progression of issue:  is unchanged Associated symptoms: no redness, bruising, swelling, loss of bowel/bladder function Treatment: to date has been acetaminophen and Salonpas.   Neurovascular symptoms: no  Past Medical History:  Diagnosis Date  . Abdominal wall hernia   . Anemia    "when lupus flares up"  . Anxiety   . Arthritis    "in my joints"  . Atrial fibrillation ([name])   . CAP (community acquired pneumonia) 08/20/2014  . Crohn's disease ([name])   . DVT of leg (deep venous thrombosis) ([facility]) 2012-8./13/2013   "have had one in each leg now"  . DVT of lower extremity, bilateral ([name])    "have had them in both legs; last one was on the RLE this year" (04/25/2012)  . GERD (gastroesophageal reflux disease)   . Gout 2016  . History of hiatal hernia   . History of kidney stones   . Hypertension   . Hypothyroidism   . Lower GI bleed 2012  . Numbness and tingling of foot    bilaterally  . OSA on CPAP   . Refusal of blood transfusion for reasons of conscience 12/20/2011   pt is [employer]'s [employer]  . Renal cell carcinoma   . Renal disorder    S/P nephrectomy; dialysis; "working fine now" (12/20/11)  . SBO (small bowel obstruction) ([name]) 11/2016  . SLE (systemic lupus erythematosus) ([facility])     Objective: VITAL SIGNS: BP 132/64 (BP Location: Right Arm, Patient Position: Sitting, Cuff Size: Normal)   Pulse 76   Temp 98.1 F (36.7 C) (Oral)   Ht 5' 3"  (1.6 m)   Wt 129 lb 8 oz (58.7 kg)   LMP 03/05/2014   SpO2 98%   BMI 22.94 kg/m  Constitutional: Well formed, well  developed. No acute distress. Thorax & Lungs: No accessory muscle use Musculoskeletal: Low back.   Tenderness to palpation: yes over R distal insertion of ES Deformity: no Ecchymosis: no Tests positive: none Tests negative: straight leg Poor hamstring rom b/l Neurologic: Normal sensory function. No focal deficits noted. DTR's equal and symmetric in LE's. No clonus. Psychiatric: Normal mood. Age appropriate judgment and insight. Alert & oriented x 3.    Assessment:  Acute right-sided low back pain without sciatica - Plan: predniSONE (DELTASONE) 20 MG tablet, HYDROcodone-acetaminophen (NORCO/VICODIN) 5-325 MG tablet  Plan: Stretches/exercises, heat, ice, Tylenol. Pt states tramadol does not work well, prefers hydrocodone. Warnings about AE's w medicine verbalized and written down.  F/u prn. The patient voiced understanding and agreement to the plan.   [place], DO 04/27/20  2:32 PM

## 2020-04-30 ENCOUNTER — Other Ambulatory Visit: Payer: Self-pay | Admitting: Family Medicine

## 2020-04-30 DIAGNOSIS — M1A0790 Idiopathic chronic gout, unspecified ankle and foot, without tophus (tophi): Secondary | ICD-10-CM

## 2020-05-04 DIAGNOSIS — K625 Hemorrhage of anus and rectum: Secondary | ICD-10-CM | POA: Diagnosis not present

## 2020-05-07 ENCOUNTER — Encounter (HOSPITAL_COMMUNITY)
Admission: RE | Admit: 2020-05-07 | Discharge: 2020-05-07 | Disposition: A | Discharge status: Home / Self Care | Payer: Medicare Other | Source: Ambulatory Visit | Attending: Nephrology | Admitting: Nephrology

## 2020-05-07 ENCOUNTER — Other Ambulatory Visit: Payer: Self-pay

## 2020-05-07 VITALS — BP 140/87 | HR 72 | Temp 96.6°F | Resp 20

## 2020-05-07 DIAGNOSIS — D631 Anemia in chronic kidney disease: Secondary | ICD-10-CM | POA: Diagnosis not present

## 2020-05-07 DIAGNOSIS — N183 Chronic kidney disease, stage 3 unspecified: Secondary | ICD-10-CM | POA: Diagnosis not present

## 2020-05-07 MED ORDER — EPOETIN ALFA 20000 UNIT/ML IJ SOLN
INTRAMUSCULAR | Status: AC
  Administered 2020-05-07: 14:00:00 20000 [IU] via SUBCUTANEOUS
  Filled 2020-05-07: qty 1

## 2020-05-07 MED ORDER — EPOETIN ALFA 10000 UNIT/ML IJ SOLN
INTRAMUSCULAR | Status: AC
  Administered 2020-05-07: 14:00:00 10000 [IU] via SUBCUTANEOUS
  Filled 2020-05-07: qty 1

## 2020-05-07 MED ORDER — EPOETIN ALFA 10000 UNIT/ML IJ SOLN: 30000.0000 [IU] | INTRAMUSCULAR | Status: DC

## 2020-05-07 NOTE — Progress Notes (Signed)
Pt here for procrit injection.  Hemoglobin today 7.5. Pt tells me that she was seen by Dr Therisa Doyne with [facility] gastroenterology for a GI bleed Monday and the last hemoglobin they took was 9. On Tuesday. She states that the bleeding has stopped now and she just feels weak. Dr Ena Dawley office called and I spoke with Mechele Claude. She states Dr Posey Pronto is not in the office and she will message him, but to continue with current dose of procrit as ordered.  I instructed pt to be sure to be seen if symptoms get worse or more bleeding occurs and to message Dr Therisa Doyne with todays hemoglobin. She states she understands and discharged ambulatory and steady.

## 2020-05-08 ENCOUNTER — Encounter (HOSPITAL_COMMUNITY): Payer: Self-pay

## 2020-05-08 ENCOUNTER — Emergency Department (HOSPITAL_COMMUNITY)
Admission: EM | Admit: 2020-05-08 | Discharge: 2020-05-08 | Disposition: A | Discharge status: Home / Self Care | Payer: Medicare Other | Attending: Emergency Medicine | Admitting: Emergency Medicine

## 2020-05-08 ENCOUNTER — Other Ambulatory Visit: Payer: Self-pay

## 2020-05-08 DIAGNOSIS — Z5321 Procedure and treatment not carried out due to patient leaving prior to being seen by health care provider: Secondary | ICD-10-CM | POA: Insufficient documentation

## 2020-05-08 DIAGNOSIS — K625 Hemorrhage of anus and rectum: Secondary | ICD-10-CM | POA: Insufficient documentation

## 2020-05-08 LAB — COMPREHENSIVE METABOLIC PANEL
ALT: 10 U/L (ref 0–44)
AST: 11 U/L — ABNORMAL LOW (ref 15–41)
Albumin: 3.3 g/dL — ABNORMAL LOW (ref 3.5–5.0)
Alkaline Phosphatase: 108 U/L (ref 38–126)
Anion gap: 8 (ref 5–15)
BUN: 80 mg/dL — ABNORMAL HIGH (ref 8–23)
CO2: 18 mmol/L — ABNORMAL LOW (ref 22–32)
Calcium: 8.3 mg/dL — ABNORMAL LOW (ref 8.9–10.3)
Chloride: 109 mmol/L (ref 98–111)
Creatinine, Ser: 1.96 mg/dL — ABNORMAL HIGH (ref 0.44–1.00)
GFR, Estimated: 26 mL/min — ABNORMAL LOW (ref >60)
Glucose, Bld: 116 mg/dL — ABNORMAL HIGH (ref 70–99)
Potassium: 4.2 mmol/L (ref 3.5–5.1)
Sodium: 135 mmol/L (ref 135–145)
Total Bilirubin: 0.5 mg/dL (ref 0.3–1.2)
Total Protein: 5.7 g/dL — ABNORMAL LOW (ref 6.5–8.1)

## 2020-05-08 LAB — CBC
HCT: 22.3 % — ABNORMAL LOW (ref 36.0–46.0)
Hemoglobin: 6.6 g/dL — CL (ref 12.0–15.0)
MCH: 30 pg (ref 26.0–34.0)
MCHC: 29.6 g/dL — ABNORMAL LOW (ref 30.0–36.0)
MCV: 101.4 fL — ABNORMAL HIGH (ref 80.0–100.0)
Platelets: 138 10*3/uL — ABNORMAL LOW (ref 150–400)
RBC: 2.2 MIL/uL — ABNORMAL LOW (ref 3.87–5.11)
RDW: 17.7 % — ABNORMAL HIGH (ref 11.5–15.5)
WBC: 14.9 10*3/uL — ABNORMAL HIGH (ref 4.0–10.5)
nRBC: 0 % (ref 0.0–0.2)

## 2020-05-08 NOTE — ED Notes (Signed)
Date and time results received: 05/08/20 2:12 PM  Test: Hemoglobin Critical Value: 6.6  Name of Provider Notified: Stacy RN  Orders Received? Or Actions Taken?: Actions Taken: Triage RN made aware

## 2020-05-08 NOTE — ED Triage Notes (Signed)
Patient reports that she had rectal bleeding 6 days ago. Patient states the bleeding stopped then started again and volume is greater. Blood burgundy in color.  Patient states she has a history of diverticulosis.

## 2020-05-09 DIAGNOSIS — K869 Disease of pancreas, unspecified: Secondary | ICD-10-CM | POA: Diagnosis present

## 2020-05-09 DIAGNOSIS — K5733 Diverticulitis of large intestine without perforation or abscess with bleeding: Secondary | ICD-10-CM | POA: Diagnosis present

## 2020-05-09 DIAGNOSIS — Z9049 Acquired absence of other specified parts of digestive tract: Secondary | ICD-10-CM | POA: Diagnosis not present

## 2020-05-09 DIAGNOSIS — Z79890 Hormone replacement therapy (postmenopausal): Secondary | ICD-10-CM | POA: Diagnosis not present

## 2020-05-09 DIAGNOSIS — Z940 Kidney transplant status: Secondary | ICD-10-CM | POA: Diagnosis not present

## 2020-05-09 DIAGNOSIS — K862 Cyst of pancreas: Secondary | ICD-10-CM | POA: Diagnosis not present

## 2020-05-09 DIAGNOSIS — Z515 Encounter for palliative care: Secondary | ICD-10-CM | POA: Diagnosis not present

## 2020-05-09 DIAGNOSIS — D649 Anemia, unspecified: Secondary | ICD-10-CM | POA: Diagnosis not present

## 2020-05-09 DIAGNOSIS — K5730 Diverticulosis of large intestine without perforation or abscess without bleeding: Secondary | ICD-10-CM | POA: Diagnosis not present

## 2020-05-09 DIAGNOSIS — I4510 Unspecified right bundle-branch block: Secondary | ICD-10-CM | POA: Diagnosis not present

## 2020-05-09 DIAGNOSIS — I120 Hypertensive chronic kidney disease with stage 5 chronic kidney disease or end stage renal disease: Secondary | ICD-10-CM | POA: Diagnosis present

## 2020-05-09 DIAGNOSIS — J984 Other disorders of lung: Secondary | ICD-10-CM | POA: Diagnosis not present

## 2020-05-09 DIAGNOSIS — I10 Essential (primary) hypertension: Secondary | ICD-10-CM | POA: Diagnosis not present

## 2020-05-09 DIAGNOSIS — I469 Cardiac arrest, cause unspecified: Secondary | ICD-10-CM | POA: Diagnosis not present

## 2020-05-09 DIAGNOSIS — D62 Acute posthemorrhagic anemia: Secondary | ICD-10-CM | POA: Diagnosis not present

## 2020-05-09 DIAGNOSIS — M109 Gout, unspecified: Secondary | ICD-10-CM | POA: Diagnosis present

## 2020-05-09 DIAGNOSIS — R000 Tachycardia, unspecified: Secondary | ICD-10-CM | POA: Diagnosis not present

## 2020-05-09 DIAGNOSIS — R0902 Hypoxemia: Secondary | ICD-10-CM | POA: Diagnosis not present

## 2020-05-09 DIAGNOSIS — N185 Chronic kidney disease, stage 5: Secondary | ICD-10-CM | POA: Diagnosis not present

## 2020-05-09 DIAGNOSIS — I700 Atherosclerosis of aorta: Secondary | ICD-10-CM | POA: Diagnosis not present

## 2020-05-09 DIAGNOSIS — E039 Hypothyroidism, unspecified: Secondary | ICD-10-CM | POA: Diagnosis present

## 2020-05-09 DIAGNOSIS — K8689 Other specified diseases of pancreas: Secondary | ICD-10-CM | POA: Diagnosis not present

## 2020-05-09 DIAGNOSIS — R42 Dizziness and giddiness: Secondary | ICD-10-CM | POA: Diagnosis not present

## 2020-05-09 DIAGNOSIS — Z66 Do not resuscitate: Secondary | ICD-10-CM | POA: Diagnosis present

## 2020-05-09 DIAGNOSIS — D631 Anemia in chronic kidney disease: Secondary | ICD-10-CM | POA: Diagnosis not present

## 2020-05-09 DIAGNOSIS — D72829 Elevated white blood cell count, unspecified: Secondary | ICD-10-CM | POA: Diagnosis not present

## 2020-05-09 DIAGNOSIS — K5090 Crohn's disease, unspecified, without complications: Secondary | ICD-10-CM | POA: Diagnosis present

## 2020-05-09 DIAGNOSIS — K922 Gastrointestinal hemorrhage, unspecified: Secondary | ICD-10-CM | POA: Diagnosis not present

## 2020-05-09 DIAGNOSIS — K5793 Diverticulitis of intestine, part unspecified, without perforation or abscess with bleeding: Secondary | ICD-10-CM | POA: Diagnosis not present

## 2020-05-09 DIAGNOSIS — R578 Other shock: Secondary | ICD-10-CM | POA: Diagnosis not present

## 2020-05-09 DIAGNOSIS — I4891 Unspecified atrial fibrillation: Secondary | ICD-10-CM | POA: Diagnosis present

## 2020-05-09 DIAGNOSIS — Z86718 Personal history of other venous thrombosis and embolism: Secondary | ICD-10-CM | POA: Diagnosis not present

## 2020-05-09 DIAGNOSIS — E875 Hyperkalemia: Secondary | ICD-10-CM | POA: Diagnosis not present

## 2020-05-09 DIAGNOSIS — K625 Hemorrhage of anus and rectum: Secondary | ICD-10-CM | POA: Diagnosis not present

## 2020-05-09 DIAGNOSIS — K7689 Other specified diseases of liver: Secondary | ICD-10-CM | POA: Diagnosis not present

## 2020-05-09 DIAGNOSIS — Z95828 Presence of other vascular implants and grafts: Secondary | ICD-10-CM | POA: Diagnosis not present

## 2020-05-09 DIAGNOSIS — J9811 Atelectasis: Secondary | ICD-10-CM | POA: Diagnosis not present

## 2020-05-09 DIAGNOSIS — Z531 Procedure and treatment not carried out because of patient's decision for reasons of belief and group pressure: Secondary | ICD-10-CM | POA: Diagnosis present

## 2020-05-09 DIAGNOSIS — G9341 Metabolic encephalopathy: Secondary | ICD-10-CM | POA: Diagnosis not present

## 2020-05-09 DIAGNOSIS — I959 Hypotension, unspecified: Secondary | ICD-10-CM | POA: Diagnosis not present

## 2020-05-09 DIAGNOSIS — R7989 Other specified abnormal findings of blood chemistry: Secondary | ICD-10-CM | POA: Diagnosis not present

## 2020-05-09 DIAGNOSIS — R0989 Other specified symptoms and signs involving the circulatory and respiratory systems: Secondary | ICD-10-CM | POA: Diagnosis not present

## 2020-05-09 DIAGNOSIS — Z8673 Personal history of transient ischemic attack (TIA), and cerebral infarction without residual deficits: Secondary | ICD-10-CM | POA: Diagnosis not present

## 2020-05-09 DIAGNOSIS — K219 Gastro-esophageal reflux disease without esophagitis: Secondary | ICD-10-CM | POA: Diagnosis present

## 2020-05-09 DIAGNOSIS — R52 Pain, unspecified: Secondary | ICD-10-CM | POA: Diagnosis not present

## 2020-05-09 DIAGNOSIS — N186 End stage renal disease: Secondary | ICD-10-CM | POA: Diagnosis not present

## 2020-05-09 DIAGNOSIS — R4182 Altered mental status, unspecified: Secondary | ICD-10-CM | POA: Diagnosis not present

## 2020-05-09 DIAGNOSIS — I82403 Acute embolism and thrombosis of unspecified deep veins of lower extremity, bilateral: Secondary | ICD-10-CM | POA: Diagnosis not present

## 2020-05-09 DIAGNOSIS — R58 Hemorrhage, not elsewhere classified: Secondary | ICD-10-CM | POA: Diagnosis not present

## 2020-05-09 DIAGNOSIS — M3214 Glomerular disease in systemic lupus erythematosus: Secondary | ICD-10-CM | POA: Diagnosis not present

## 2020-05-09 DIAGNOSIS — Z7901 Long term (current) use of anticoagulants: Secondary | ICD-10-CM | POA: Diagnosis not present

## 2020-05-09 DIAGNOSIS — N184 Chronic kidney disease, stage 4 (severe): Secondary | ICD-10-CM | POA: Diagnosis not present

## 2020-05-09 DIAGNOSIS — R55 Syncope and collapse: Secondary | ICD-10-CM | POA: Diagnosis not present

## 2020-05-11 LAB — POCT HEMOGLOBIN-HEMACUE: Hemoglobin: 7.5 g/dL — ABNORMAL LOW (ref 12.0–15.0)

## 2020-05-21 ENCOUNTER — Encounter (HOSPITAL_COMMUNITY): Payer: Medicare Other

## 2020-05-26 ENCOUNTER — Other Ambulatory Visit: Payer: Medicare Other

## 2020-05-26 ENCOUNTER — Ambulatory Visit: Payer: Medicare Other | Admitting: Family Medicine

## 2020-06-09 DEATH — deceased

## 2020-07-02 ENCOUNTER — Ambulatory Visit: Payer: Medicare Other | Admitting: Internal Medicine

## 2020-09-06 IMAGING — US US EXTREM LOW VENOUS
1 series · 12 of 24 positions shown · non-contrast
Comparison: August 02, 2019

CLINICAL DATA: Previous deep venous thrombosis with Xarelto therapy

EXAM:
BILATERAL LOWER EXTREMITY VENOUS DUPLEX ULTRASOUND
TECHNIQUE: Gray-scale sonography with graded compression, as well as color
Doppler and duplex ultrasound were performed to evaluate the lower
extremity deep venous systems from the level of the common femoral
vein and including the common femoral, femoral, profunda femoral,
popliteal and calf veins including the posterior tibial, peroneal
and gastrocnemius veins when visible. The superficial great
saphenous vein was also interrogated. Spectral Doppler was utilized
to evaluate flow at rest and with distal augmentation maneuvers in
the common femoral, femoral and popliteal veins.

[Series 1: us extrem low venous · 56 acquisitions, 12 frames shown]
[im 3/56]
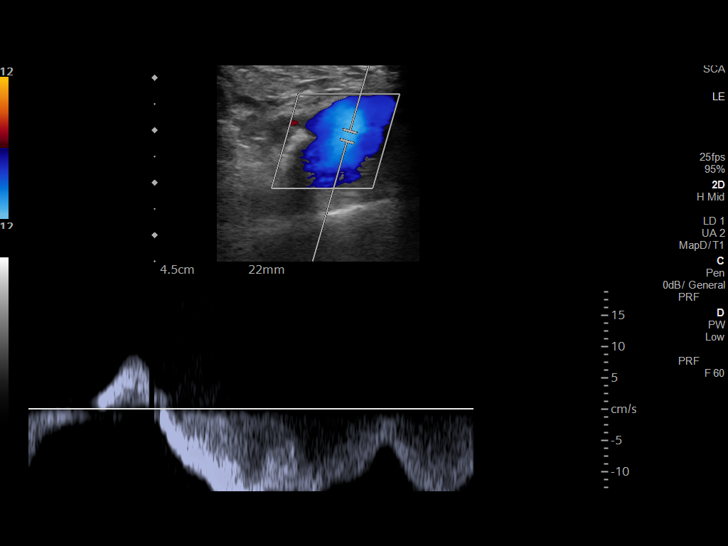
[im 5/56]
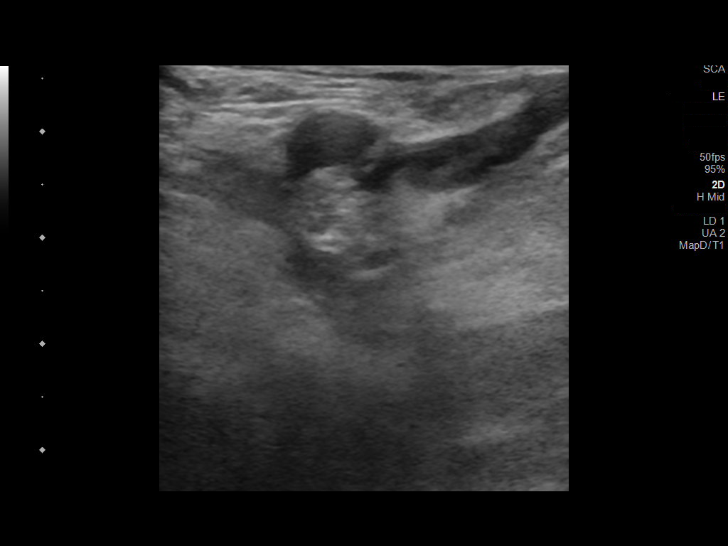
[im 12/56]
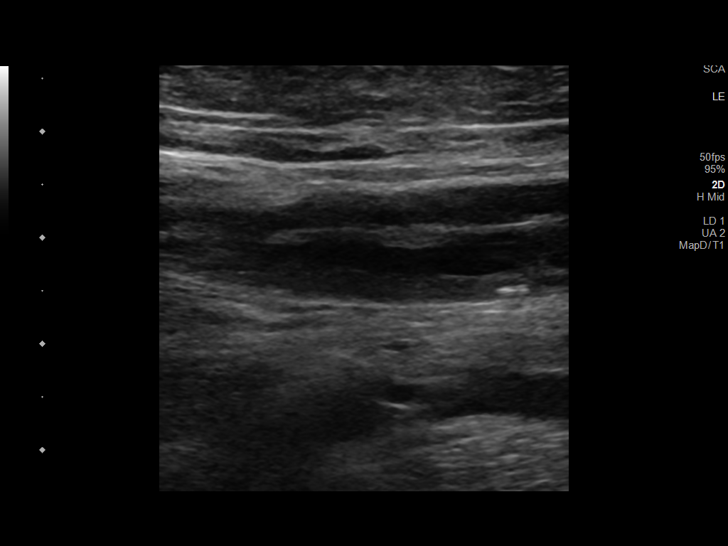
[im 17/56]
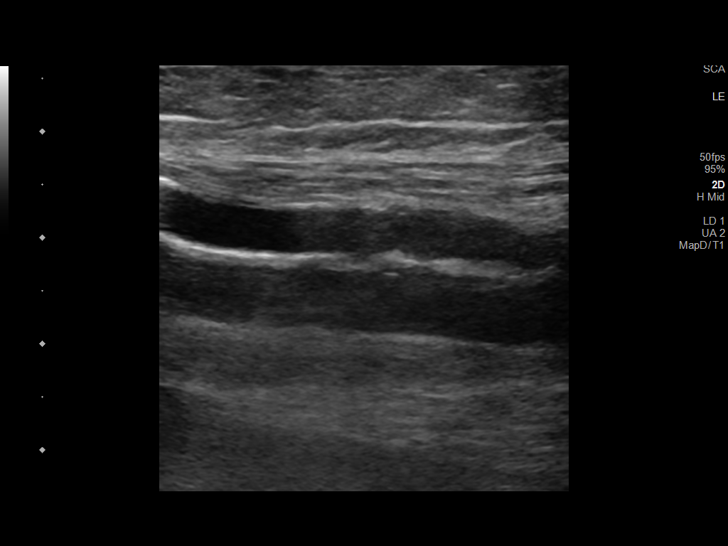
[im 20/56]
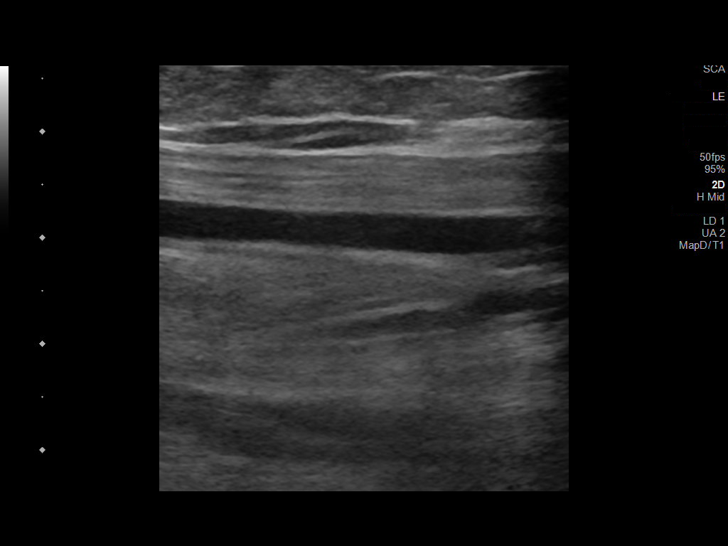
[im 24/56]
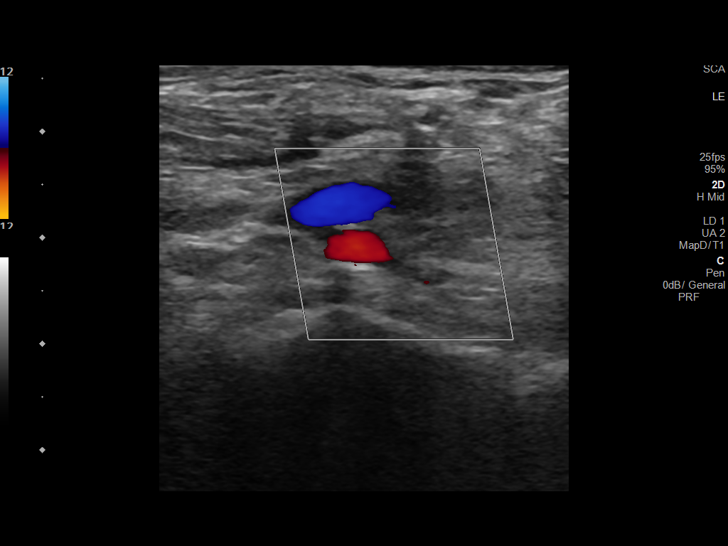
[im 32/56]
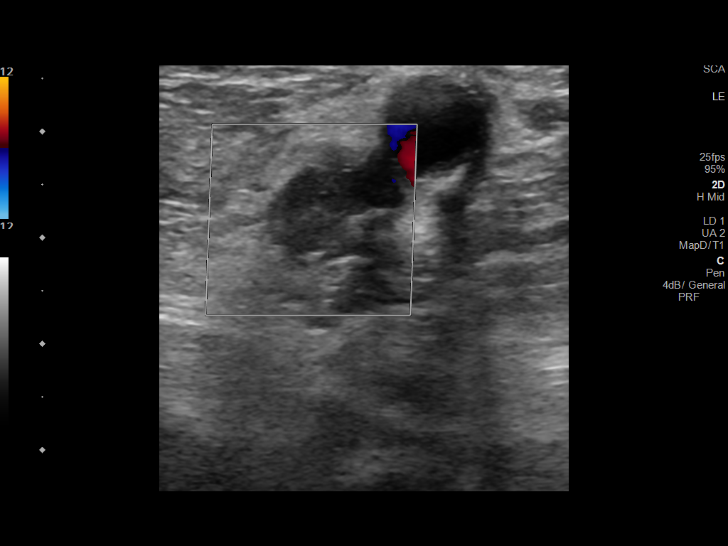
[im 36/56]
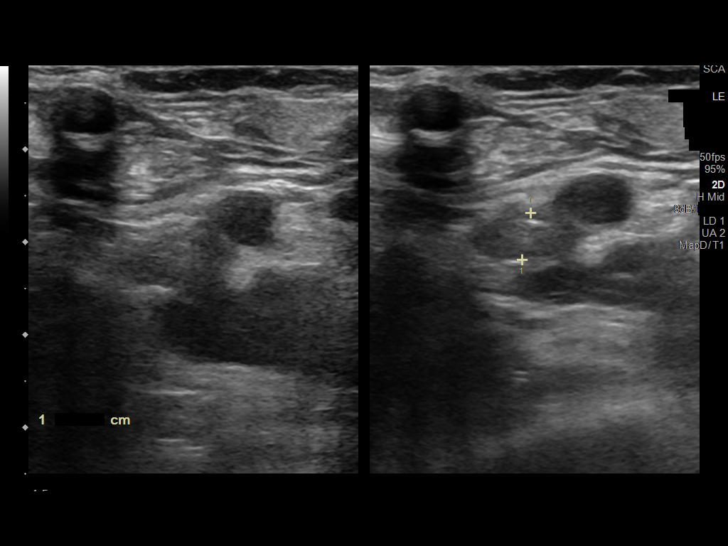
[im 41/56]
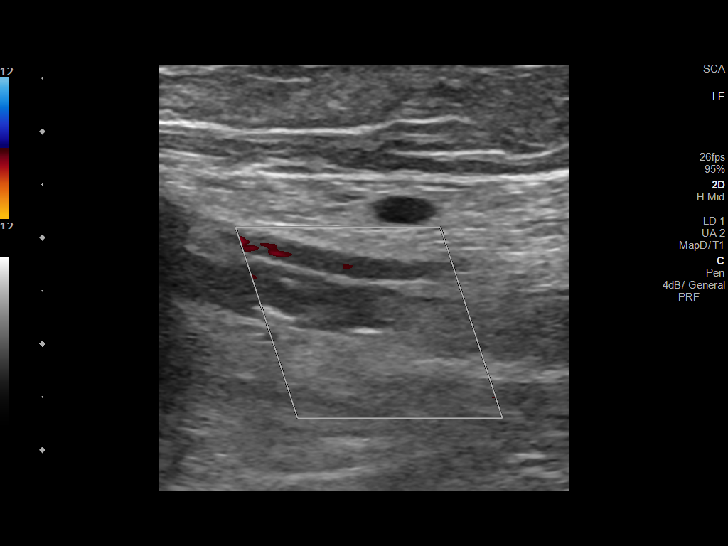
[im 46/56]
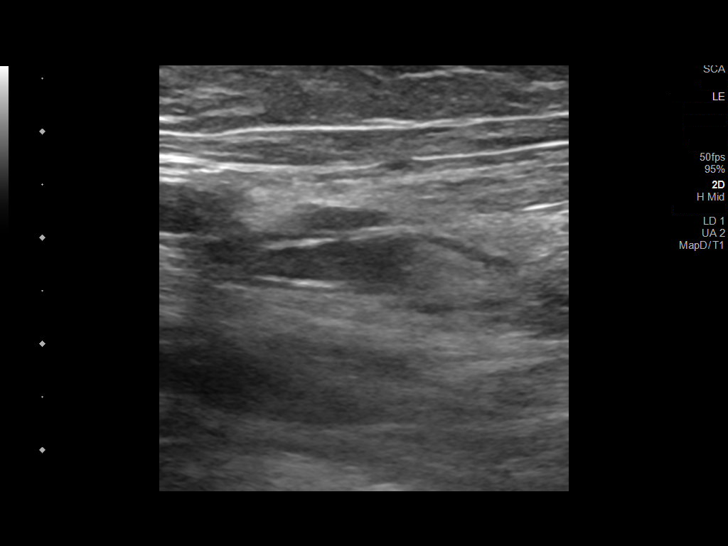
[im 51/56]
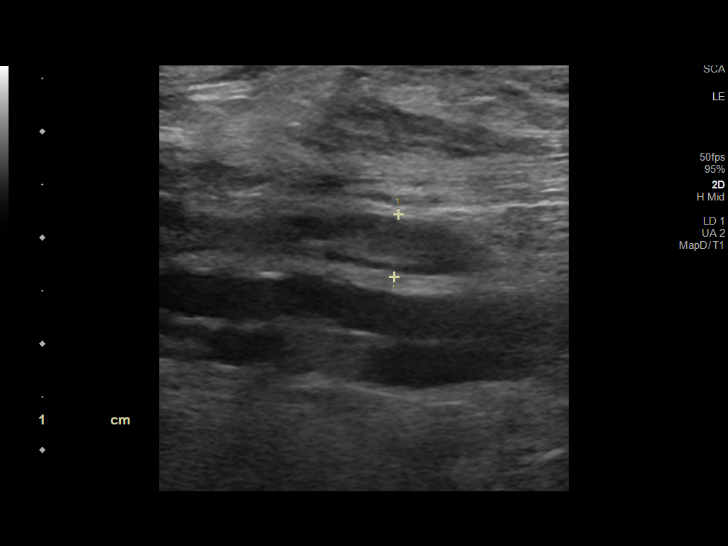
[im 56/56]
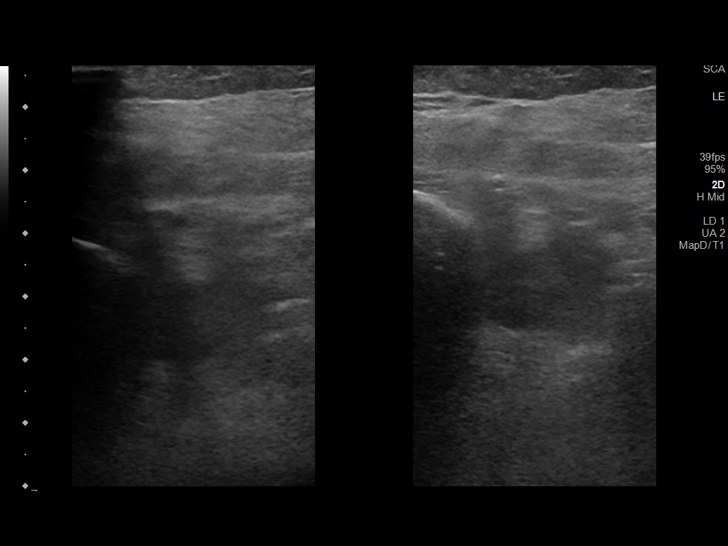

[12 of 24 positions shown; findings below may reference images not displayed]

FINDINGS: RIGHT LOWER EXTREMITY

Common Femoral Vein: There is echogenic thrombus within the right
common femoral vein, partially calcified. There is diminished flow
in this vessel with loss of compression and augmentation.

Saphenofemoral Junction: No evidence of thrombus. Normal
compressibility and flow on color Doppler imaging.

Profunda Femoral Vein: No evidence of thrombus. Normal
compressibility and flow on color Doppler imaging.

Femoral Vein: There is echogenic thrombus in the right superficial
femoral vein with limited flow. There is diminished compression in
this vessel.

Popliteal Vein: No evidence of thrombus. Normal compressibility,
respiratory phasicity and response to augmentation.

Calf Veins: No evidence of thrombus. Normal compressibility and flow
on color Doppler imaging.

Superficial Great Saphenous Vein: No evidence of thrombus. Normal
compressibility.

Venous Reflux:  None.

Other Findings:  None.

LEFT LOWER EXTREMITY

Common Femoral Vein: There is mixed echogenicity thrombus in the
left common femoral vein with nearly absent flow. There is loss of
compression and augmentation in this area.

Saphenofemoral Junction: There is mixed echogenicity thrombus at the
saphenofemoral junction with limited flow as well as loss of
compression and augmentation.

Profunda Femoral Vein: No evidence of thrombus. Normal
compressibility and flow on color Doppler imaging.

Femoral Vein: There is mixed echogenicity thrombus in the left
femoral vein with essentially absent flow. No appreciable
compression and augmentation.

Popliteal Vein: There is mixed echogenicity thrombus in the left
common popliteal vein with limited flow. No appreciable compression
and augmentation.

Calf Veins: There is thrombus in the peroneal vein which appears
chronic. The posterior tibial vein appears patent.

Superficial Great Saphenous Vein: No evidence of thrombus. Normal
compressibility.

Venous Reflux:  None.

Other Findings:  None.
IMPRESSION: Chronic appearing deep venous thrombosis in the common femoral vein
and femoral vein on the right. On the left, there are multiple areas
of deep venous thrombosis with what appear to be both chronic and
more acute components. There is felt to be a degree of relatively
acute deep venous thrombosis on the left.

These results will be called to the ordering clinician or
representative by the Radiologist Assistant, and communication
documented in the PACS or [REDACTED].
